# Patient Record
Sex: Female | Born: 1935 | Race: White | Hispanic: No | Smoking: Never smoker
Health system: Southern US, Community
[De-identification: ages and names within clinical notes are randomized; demographics above are authoritative.]

## PROBLEM LIST (undated history)

## (undated) ENCOUNTER — Emergency Department (HOSPITAL_COMMUNITY): Admission: EM | Discharge status: Against Medical Advice | Payer: Medicare Other

## (undated) DIAGNOSIS — M1990 Unspecified osteoarthritis, unspecified site: Secondary | ICD-10-CM

## (undated) DIAGNOSIS — I4891 Unspecified atrial fibrillation: Secondary | ICD-10-CM

## (undated) DIAGNOSIS — Z8042 Family history of malignant neoplasm of prostate: Secondary | ICD-10-CM

## (undated) DIAGNOSIS — A048 Other specified bacterial intestinal infections: Secondary | ICD-10-CM

## (undated) DIAGNOSIS — I499 Cardiac arrhythmia, unspecified: Secondary | ICD-10-CM

## (undated) DIAGNOSIS — F32A Depression, unspecified: Secondary | ICD-10-CM

## (undated) DIAGNOSIS — Z803 Family history of malignant neoplasm of breast: Secondary | ICD-10-CM

## (undated) DIAGNOSIS — K635 Polyp of colon: Secondary | ICD-10-CM

## (undated) DIAGNOSIS — F329 Major depressive disorder, single episode, unspecified: Secondary | ICD-10-CM

## (undated) DIAGNOSIS — K219 Gastro-esophageal reflux disease without esophagitis: Secondary | ICD-10-CM

## (undated) DIAGNOSIS — K222 Esophageal obstruction: Secondary | ICD-10-CM

## (undated) DIAGNOSIS — K625 Hemorrhage of anus and rectum: Secondary | ICD-10-CM

## (undated) DIAGNOSIS — F0390 Unspecified dementia without behavioral disturbance: Secondary | ICD-10-CM

## (undated) DIAGNOSIS — B0229 Other postherpetic nervous system involvement: Secondary | ICD-10-CM

## (undated) DIAGNOSIS — Z800 Family history of malignant neoplasm of digestive organs: Secondary | ICD-10-CM

## (undated) DIAGNOSIS — H3530 Unspecified macular degeneration: Secondary | ICD-10-CM

## (undated) DIAGNOSIS — K449 Diaphragmatic hernia without obstruction or gangrene: Secondary | ICD-10-CM

## (undated) DIAGNOSIS — E042 Nontoxic multinodular goiter: Secondary | ICD-10-CM

## (undated) DIAGNOSIS — E785 Hyperlipidemia, unspecified: Secondary | ICD-10-CM

## (undated) HISTORY — DX: Unspecified osteoarthritis, unspecified site: M19.90

## (undated) HISTORY — PX: APPENDECTOMY: SHX54

## (undated) HISTORY — DX: Other postherpetic nervous system involvement: B02.29

## (undated) HISTORY — DX: Major depressive disorder, single episode, unspecified: F32.9

## (undated) HISTORY — PX: TUBAL LIGATION: SHX77

## (undated) HISTORY — DX: Family history of malignant neoplasm of breast: Z80.3

## (undated) HISTORY — DX: Unspecified macular degeneration: H35.30

## (undated) HISTORY — DX: Family history of malignant neoplasm of prostate: Z80.42

## (undated) HISTORY — PX: ESOPHAGEAL DILATION: SHX303

## (undated) HISTORY — DX: Diaphragmatic hernia without obstruction or gangrene: K44.9

## (undated) HISTORY — DX: Gastro-esophageal reflux disease without esophagitis: K21.9

## (undated) HISTORY — DX: Hemorrhage of anus and rectum: K62.5

## (undated) HISTORY — DX: Depression, unspecified: F32.A

## (undated) HISTORY — DX: Hyperlipidemia, unspecified: E78.5

## (undated) HISTORY — DX: Other specified bacterial intestinal infections: A04.8

## (undated) HISTORY — PX: CARDIAC CATHETERIZATION: SHX172

## (undated) HISTORY — DX: Unspecified atrial fibrillation: I48.91

## (undated) HISTORY — DX: Esophageal obstruction: K22.2

## (undated) HISTORY — DX: Nontoxic multinodular goiter: E04.2

## (undated) HISTORY — DX: Family history of malignant neoplasm of digestive organs: Z80.0

## (undated) HISTORY — DX: Polyp of colon: K63.5

---

## 1970-10-21 HISTORY — PX: TOTAL ABDOMINAL HYSTERECTOMY: SHX209

## 1987-10-22 HISTORY — PX: CHOLECYSTECTOMY: SHX55

## 1998-06-21 ENCOUNTER — Other Ambulatory Visit: Admission: RE | Admit: 1998-06-21 | Discharge: 1998-06-21 | Payer: Self-pay | Admitting: Obstetrics and Gynecology

## 1999-06-26 ENCOUNTER — Ambulatory Visit (HOSPITAL_COMMUNITY): Admission: RE | Admit: 1999-06-26 | Discharge: 1999-06-26 | Payer: Self-pay | Admitting: Internal Medicine

## 1999-06-26 ENCOUNTER — Encounter: Payer: Self-pay | Admitting: Internal Medicine

## 1999-07-11 ENCOUNTER — Other Ambulatory Visit: Admission: RE | Admit: 1999-07-11 | Discharge: 1999-07-11 | Payer: Self-pay | Admitting: Obstetrics and Gynecology

## 1999-08-12 ENCOUNTER — Ambulatory Visit (HOSPITAL_COMMUNITY): Admission: RE | Admit: 1999-08-12 | Discharge: 1999-08-12 | Payer: Self-pay | Admitting: Rheumatology

## 1999-08-12 ENCOUNTER — Encounter: Payer: Self-pay | Admitting: Rheumatology

## 2000-01-09 ENCOUNTER — Encounter: Payer: Self-pay | Admitting: Internal Medicine

## 2000-01-09 ENCOUNTER — Ambulatory Visit (HOSPITAL_COMMUNITY): Admission: RE | Admit: 2000-01-09 | Discharge: 2000-01-09 | Payer: Self-pay | Admitting: Internal Medicine

## 2000-06-18 ENCOUNTER — Encounter: Admission: RE | Admit: 2000-06-18 | Discharge: 2000-06-18 | Payer: Self-pay | Admitting: Obstetrics and Gynecology

## 2000-06-18 ENCOUNTER — Encounter: Payer: Self-pay | Admitting: Obstetrics and Gynecology

## 2000-07-18 ENCOUNTER — Other Ambulatory Visit: Admission: RE | Admit: 2000-07-18 | Discharge: 2000-07-18 | Payer: Self-pay | Admitting: Obstetrics and Gynecology

## 2001-06-24 ENCOUNTER — Encounter: Admission: RE | Admit: 2001-06-24 | Discharge: 2001-06-24 | Payer: Self-pay | Admitting: Obstetrics and Gynecology

## 2001-06-24 ENCOUNTER — Encounter: Payer: Self-pay | Admitting: Obstetrics and Gynecology

## 2001-08-26 ENCOUNTER — Ambulatory Visit (HOSPITAL_COMMUNITY): Admission: RE | Admit: 2001-08-26 | Discharge: 2001-08-26 | Payer: Self-pay | Admitting: Internal Medicine

## 2001-08-26 ENCOUNTER — Encounter: Payer: Self-pay | Admitting: Internal Medicine

## 2002-10-07 ENCOUNTER — Other Ambulatory Visit: Admission: RE | Admit: 2002-10-07 | Discharge: 2002-10-07 | Payer: Self-pay | Admitting: Obstetrics and Gynecology

## 2002-11-29 ENCOUNTER — Encounter: Payer: Self-pay | Admitting: Internal Medicine

## 2002-11-29 ENCOUNTER — Encounter: Admission: RE | Admit: 2002-11-29 | Discharge: 2002-11-29 | Payer: Self-pay | Admitting: Internal Medicine

## 2003-07-07 ENCOUNTER — Encounter: Admission: RE | Admit: 2003-07-07 | Discharge: 2003-07-07 | Payer: Self-pay | Admitting: Internal Medicine

## 2003-07-07 ENCOUNTER — Encounter: Payer: Self-pay | Admitting: Internal Medicine

## 2003-10-22 DIAGNOSIS — K635 Polyp of colon: Secondary | ICD-10-CM

## 2003-10-22 HISTORY — PX: COLONOSCOPY W/ POLYPECTOMY: SHX1380

## 2003-10-22 HISTORY — DX: Polyp of colon: K63.5

## 2003-12-06 ENCOUNTER — Encounter: Payer: Self-pay | Admitting: Internal Medicine

## 2004-01-17 ENCOUNTER — Encounter: Admission: RE | Admit: 2004-01-17 | Discharge: 2004-01-17 | Payer: Self-pay | Admitting: Obstetrics and Gynecology

## 2004-02-06 ENCOUNTER — Other Ambulatory Visit: Admission: RE | Admit: 2004-02-06 | Discharge: 2004-02-06 | Payer: Self-pay | Admitting: Obstetrics and Gynecology

## 2004-02-23 ENCOUNTER — Encounter: Payer: Self-pay | Admitting: Internal Medicine

## 2005-01-18 ENCOUNTER — Encounter: Admission: RE | Admit: 2005-01-18 | Discharge: 2005-01-18 | Payer: Self-pay | Admitting: Internal Medicine

## 2005-01-18 ENCOUNTER — Ambulatory Visit: Payer: Self-pay | Admitting: Internal Medicine

## 2005-02-14 ENCOUNTER — Ambulatory Visit: Payer: Self-pay | Admitting: Internal Medicine

## 2005-02-16 ENCOUNTER — Emergency Department (HOSPITAL_COMMUNITY): Admission: EM | Admit: 2005-02-16 | Discharge: 2005-02-16 | Payer: Self-pay | Admitting: Emergency Medicine

## 2005-03-11 ENCOUNTER — Ambulatory Visit: Payer: Self-pay | Admitting: *Deleted

## 2005-03-20 ENCOUNTER — Ambulatory Visit: Payer: Self-pay

## 2005-05-13 ENCOUNTER — Ambulatory Visit: Payer: Self-pay | Admitting: Internal Medicine

## 2005-07-04 ENCOUNTER — Ambulatory Visit: Payer: Self-pay | Admitting: Internal Medicine

## 2005-08-19 ENCOUNTER — Ambulatory Visit: Payer: Self-pay | Admitting: Internal Medicine

## 2005-09-02 ENCOUNTER — Ambulatory Visit: Payer: Self-pay | Admitting: Internal Medicine

## 2005-09-02 ENCOUNTER — Ambulatory Visit (HOSPITAL_COMMUNITY): Admission: RE | Admit: 2005-09-02 | Discharge: 2005-09-02 | Payer: Self-pay | Admitting: Internal Medicine

## 2005-10-29 ENCOUNTER — Ambulatory Visit: Payer: Self-pay | Admitting: Internal Medicine

## 2006-07-29 ENCOUNTER — Ambulatory Visit: Payer: Self-pay | Admitting: Internal Medicine

## 2006-08-05 ENCOUNTER — Ambulatory Visit: Payer: Self-pay | Admitting: Internal Medicine

## 2006-08-18 ENCOUNTER — Ambulatory Visit: Payer: Self-pay | Admitting: Internal Medicine

## 2006-11-28 ENCOUNTER — Encounter: Admission: RE | Admit: 2006-11-28 | Discharge: 2006-11-28 | Payer: Self-pay | Admitting: Internal Medicine

## 2006-12-22 ENCOUNTER — Ambulatory Visit: Payer: Self-pay | Admitting: Internal Medicine

## 2008-01-28 ENCOUNTER — Ambulatory Visit: Payer: Self-pay | Admitting: Internal Medicine

## 2008-01-28 DIAGNOSIS — K219 Gastro-esophageal reflux disease without esophagitis: Secondary | ICD-10-CM | POA: Insufficient documentation

## 2008-01-28 DIAGNOSIS — E042 Nontoxic multinodular goiter: Secondary | ICD-10-CM | POA: Insufficient documentation

## 2008-01-28 DIAGNOSIS — E785 Hyperlipidemia, unspecified: Secondary | ICD-10-CM | POA: Insufficient documentation

## 2008-02-03 ENCOUNTER — Encounter (INDEPENDENT_AMBULATORY_CARE_PROVIDER_SITE_OTHER): Payer: Self-pay | Admitting: *Deleted

## 2008-02-04 ENCOUNTER — Telehealth: Payer: Self-pay | Admitting: Internal Medicine

## 2008-02-05 ENCOUNTER — Encounter: Admission: RE | Admit: 2008-02-05 | Discharge: 2008-02-05 | Payer: Self-pay | Admitting: Internal Medicine

## 2008-02-08 ENCOUNTER — Encounter (INDEPENDENT_AMBULATORY_CARE_PROVIDER_SITE_OTHER): Payer: Self-pay | Admitting: *Deleted

## 2008-02-10 ENCOUNTER — Encounter (INDEPENDENT_AMBULATORY_CARE_PROVIDER_SITE_OTHER): Payer: Self-pay | Admitting: *Deleted

## 2008-02-10 ENCOUNTER — Ambulatory Visit: Payer: Self-pay | Admitting: Internal Medicine

## 2008-02-10 LAB — CONVERTED CEMR LAB
OCCULT 1: NEGATIVE
OCCULT 2: NEGATIVE

## 2008-02-11 ENCOUNTER — Encounter: Payer: Self-pay | Admitting: Internal Medicine

## 2008-02-11 ENCOUNTER — Telehealth (INDEPENDENT_AMBULATORY_CARE_PROVIDER_SITE_OTHER): Payer: Self-pay | Admitting: *Deleted

## 2008-07-20 DIAGNOSIS — K222 Esophageal obstruction: Secondary | ICD-10-CM | POA: Insufficient documentation

## 2008-07-20 DIAGNOSIS — M1990 Unspecified osteoarthritis, unspecified site: Secondary | ICD-10-CM | POA: Insufficient documentation

## 2008-07-27 ENCOUNTER — Ambulatory Visit: Payer: Self-pay | Admitting: Internal Medicine

## 2008-07-29 ENCOUNTER — Ambulatory Visit: Payer: Self-pay | Admitting: Internal Medicine

## 2008-08-01 ENCOUNTER — Ambulatory Visit (HOSPITAL_COMMUNITY): Admission: RE | Admit: 2008-08-01 | Discharge: 2008-08-01 | Payer: Self-pay | Admitting: Internal Medicine

## 2008-08-02 ENCOUNTER — Telehealth: Payer: Self-pay | Admitting: Internal Medicine

## 2008-08-11 ENCOUNTER — Emergency Department (HOSPITAL_COMMUNITY): Admission: EM | Admit: 2008-08-11 | Discharge: 2008-08-11 | Payer: Self-pay | Admitting: Emergency Medicine

## 2008-08-11 ENCOUNTER — Telehealth: Payer: Self-pay | Admitting: Internal Medicine

## 2008-08-24 ENCOUNTER — Encounter: Payer: Self-pay | Admitting: Internal Medicine

## 2008-08-24 ENCOUNTER — Ambulatory Visit: Payer: Self-pay | Admitting: Internal Medicine

## 2008-08-31 ENCOUNTER — Telehealth: Payer: Self-pay | Admitting: Internal Medicine

## 2008-09-02 ENCOUNTER — Ambulatory Visit: Payer: Self-pay | Admitting: Internal Medicine

## 2008-09-02 ENCOUNTER — Encounter: Admission: RE | Admit: 2008-09-02 | Discharge: 2008-09-02 | Payer: Self-pay | Admitting: Internal Medicine

## 2008-09-05 ENCOUNTER — Telehealth (INDEPENDENT_AMBULATORY_CARE_PROVIDER_SITE_OTHER): Payer: Self-pay | Admitting: *Deleted

## 2008-09-05 ENCOUNTER — Encounter (INDEPENDENT_AMBULATORY_CARE_PROVIDER_SITE_OTHER): Payer: Self-pay | Admitting: *Deleted

## 2008-09-13 ENCOUNTER — Encounter: Admission: RE | Admit: 2008-09-13 | Discharge: 2008-09-13 | Payer: Self-pay | Admitting: Obstetrics and Gynecology

## 2008-09-13 ENCOUNTER — Encounter: Payer: Self-pay | Admitting: Internal Medicine

## 2008-10-21 HISTORY — PX: UPPER GI ENDOSCOPY: SHX6162

## 2008-11-23 ENCOUNTER — Ambulatory Visit: Payer: Self-pay | Admitting: Internal Medicine

## 2008-11-23 ENCOUNTER — Telehealth: Payer: Self-pay | Admitting: Internal Medicine

## 2008-11-28 ENCOUNTER — Ambulatory Visit: Payer: Self-pay | Admitting: Cardiology

## 2009-01-18 ENCOUNTER — Encounter (INDEPENDENT_AMBULATORY_CARE_PROVIDER_SITE_OTHER): Payer: Self-pay | Admitting: *Deleted

## 2009-02-13 ENCOUNTER — Encounter: Admission: RE | Admit: 2009-02-13 | Discharge: 2009-02-13 | Payer: Self-pay | Admitting: Obstetrics and Gynecology

## 2009-03-06 ENCOUNTER — Encounter: Payer: Self-pay | Admitting: Internal Medicine

## 2009-04-20 ENCOUNTER — Telehealth: Payer: Self-pay | Admitting: Internal Medicine

## 2009-04-26 ENCOUNTER — Ambulatory Visit: Payer: Self-pay | Admitting: Internal Medicine

## 2009-05-25 ENCOUNTER — Ambulatory Visit: Payer: Self-pay | Admitting: Internal Medicine

## 2009-05-26 ENCOUNTER — Encounter: Payer: Self-pay | Admitting: Internal Medicine

## 2009-05-29 LAB — CONVERTED CEMR LAB
ALT: 21 units/L (ref 0–35)
AST: 25 units/L (ref 0–37)
BUN: 16 mg/dL (ref 6–23)
Basophils Absolute: 0 10*3/uL (ref 0.0–0.1)
CO2: 29 meq/L (ref 19–32)
Calcium: 9.5 mg/dL (ref 8.4–10.5)
Cholesterol: 197 mg/dL (ref 0–200)
Creatinine, Ser: 0.7 mg/dL (ref 0.4–1.2)
Free T4: 0.8 ng/dL (ref 0.6–1.6)
Glucose, Bld: 78 mg/dL (ref 70–99)
Hemoglobin: 13.1 g/dL (ref 12.0–15.0)
MCV: 87 fL (ref 78.0–100.0)
Platelets: 275 10*3/uL (ref 150.0–400.0)
Sodium: 144 meq/L (ref 135–145)
TSH: 0.76 microintl units/mL (ref 0.35–5.50)
Total Bilirubin: 1.1 mg/dL (ref 0.3–1.2)
Total CHOL/HDL Ratio: 4
VLDL: 28.2 mg/dL (ref 0.0–40.0)

## 2009-05-31 ENCOUNTER — Encounter (INDEPENDENT_AMBULATORY_CARE_PROVIDER_SITE_OTHER): Payer: Self-pay | Admitting: *Deleted

## 2009-05-31 ENCOUNTER — Telehealth (INDEPENDENT_AMBULATORY_CARE_PROVIDER_SITE_OTHER): Payer: Self-pay | Admitting: *Deleted

## 2009-06-30 ENCOUNTER — Ambulatory Visit: Payer: Self-pay | Admitting: Internal Medicine

## 2009-06-30 LAB — HM COLONOSCOPY

## 2009-11-22 ENCOUNTER — Ambulatory Visit: Payer: Self-pay | Admitting: Internal Medicine

## 2009-12-04 LAB — CONVERTED CEMR LAB
ALT: 19 units/L (ref 0–35)
Cholesterol: 184 mg/dL (ref 0–200)
HDL: 52.4 mg/dL (ref >39.00)
Total Protein: 7 g/dL (ref 6.0–8.3)
Triglycerides: 101 mg/dL (ref 0.0–149.0)

## 2010-01-30 ENCOUNTER — Ambulatory Visit: Payer: Self-pay | Admitting: Internal Medicine

## 2010-01-30 DIAGNOSIS — F32A Depression, unspecified: Secondary | ICD-10-CM | POA: Insufficient documentation

## 2010-01-30 DIAGNOSIS — F329 Major depressive disorder, single episode, unspecified: Secondary | ICD-10-CM

## 2010-01-30 DIAGNOSIS — Z860100 Personal history of colon polyps, unspecified: Secondary | ICD-10-CM | POA: Insufficient documentation

## 2010-02-13 ENCOUNTER — Encounter: Payer: Self-pay | Admitting: Internal Medicine

## 2010-03-06 ENCOUNTER — Encounter: Payer: Self-pay | Admitting: Internal Medicine

## 2010-03-11 ENCOUNTER — Encounter: Payer: Self-pay | Admitting: Internal Medicine

## 2010-03-11 ENCOUNTER — Encounter: Admission: RE | Admit: 2010-03-11 | Discharge: 2010-03-11 | Payer: Self-pay | Admitting: Obstetrics and Gynecology

## 2010-03-16 ENCOUNTER — Encounter: Payer: Self-pay | Admitting: Internal Medicine

## 2010-08-14 ENCOUNTER — Ambulatory Visit: Payer: Self-pay | Admitting: Internal Medicine

## 2010-08-14 DIAGNOSIS — R1033 Periumbilical pain: Secondary | ICD-10-CM | POA: Insufficient documentation

## 2010-08-14 DIAGNOSIS — Z8711 Personal history of peptic ulcer disease: Secondary | ICD-10-CM | POA: Insufficient documentation

## 2010-08-29 ENCOUNTER — Ambulatory Visit: Payer: Self-pay | Admitting: Internal Medicine

## 2010-08-29 ENCOUNTER — Encounter: Payer: Self-pay | Admitting: Gastroenterology

## 2010-09-06 ENCOUNTER — Telehealth: Payer: Self-pay | Admitting: Gastroenterology

## 2010-10-23 ENCOUNTER — Encounter: Payer: Self-pay | Admitting: Internal Medicine

## 2010-11-11 ENCOUNTER — Encounter: Payer: Self-pay | Admitting: Internal Medicine

## 2010-11-18 LAB — CONVERTED CEMR LAB
ALT: 21 units/L (ref 0–35)
AST: 23 units/L (ref 0–37)
Albumin: 4.2 g/dL (ref 3.5–5.2)
Alkaline Phosphatase: 57 units/L (ref 39–117)
BUN: 17 mg/dL (ref 6–23)
Basophils Relative: 0.5 % (ref 0.0–1.0)
Bilirubin, Direct: 0.1 mg/dL (ref 0.0–0.3)
GFR calc non Af Amer: 75 mL/min
HCT: 40 % (ref 36.0–46.0)
Lymphocytes Relative: 37.2 % (ref 12.0–46.0)
MCHC: 32.5 g/dL (ref 30.0–36.0)
Monocytes Absolute: 0.4 10*3/uL (ref 0.1–1.0)
Monocytes Relative: 7.6 % (ref 3.0–12.0)
Neutro Abs: 3.2 10*3/uL (ref 1.4–7.7)
Neutrophils Relative %: 53.5 % (ref 43.0–77.0)
Platelets: 325 10*3/uL (ref 150–400)
RDW: 13.3 % (ref 11.5–14.6)
TSH: 0.82 microintl units/mL (ref 0.35–5.50)
Total Bilirubin: 1 mg/dL (ref 0.3–1.2)
VLDL: 32 mg/dL (ref 0–40)

## 2010-11-22 NOTE — Letter (Signed)
Summary: [facility]   Imported By: Lanelle Bal 03/24/2010 09:47:23  _____________________________________________________________________  External Attachment:    Type:   Image     Comment:   External Document

## 2010-11-22 NOTE — Progress Notes (Signed)
Summary: Breath Test Results  Phone Note Outgoing Call Call back at Work Phone [phone]   Call placed by: Harlow Mares CMA Duncan Dull),  September 06, 2010 8:26 AM Call placed to: Patient Summary of Call: advised pt that the breath test was neg, Initial call taken by: Harlow Mares CMA Duncan Dull),  September 06, 2010 8:26 AM

## 2010-11-22 NOTE — Procedures (Signed)
Summary: 530.81, 789.09/sheri  Pt here for urea breath test.  Jesse Fall RN  August 31, 2010 9:51 AM

## 2010-11-22 NOTE — Assessment & Plan Note (Signed)
Summary: problems w/swallowing--ch.   History of Present Illness Visit Type: Follow-up Visit Primary GI MD: [name] MD Primary Provider: Elmore Guise MD Requesting Provider: na Chief Complaint: Upper abd pain, and GERD  History of Present Illness:   This is a 77 year old white female with irritable bowel syndrome, gastroesophageal reflux and esophageal dysmotility based on a barium swallow in October 2009 showing impaired secondary peristalsis, prolonged pooling of the barium in the esophagus and tertiary contractions. There was positive reflux up to the level of the clavicle. A CT scan of the abdomen in February 2010 for evaluation of abdominal pain showed an 8 mm common bile duct which was consistent with postcholecystectomy state. She is complaining of continued abdominal tenderness around the umbilical area as well as at the epigastrium. A colonoscopy in May 2005 and again in September 2010 showed internal hemorrhoids but was otherwise normal. Her father had colon cancer. An upper endoscopy in October 2009 was positive for H. pylori which was treated.  She was dilated with a 49 [place] and 50 [place] dilator with complete relief of her dysphagia. She is under a lot of stress. She was unable to take dicyclomine due to dizziness and memory loss.   GI Review of Systems    Reports abdominal pain, acid reflux, and  heartburn.     Location of  Abdominal pain: upper abdomen.    Denies belching, bloating, chest pain, dysphagia with liquids, dysphagia with solids, loss of appetite, nausea, vomiting, vomiting blood, weight loss, and  weight gain.        Denies anal fissure, black tarry stools, change in bowel habit, constipation, diarrhea, diverticulosis, fecal incontinence, heme positive stool, hemorrhoids, irritable bowel syndrome, jaundice, light color stool, liver problems, rectal bleeding, and  rectal pain.    Current Medications (verified): 1)  Advil Migraine 200 Mg Caps (Ibuprofen) ....  2am, 2pm 2)  Glucosamine 1500 Complex  Caps (Glucosamine-Chondroit-Vit C-Mn) .Marland Kitchen.. 1 Once Daily 3)  Prevacid 15 Mg Cpdr (Lansoprazole) .Marland Kitchen.. 1 By Mouth Once Daily 4)  Bentyl 20 Mg Tabs (Dicyclomine Hcl) .... Take 1 Tablet By Mouth Two Times A Day(On Hold Per Pt) 5)  Caltrate 600 1500 Mg Tabs (Calcium Carbonate) .Marland Kitchen.. 1 By Mouth Once Daily 6)  Multivitamins  Tabs (Multiple Vitamin) .... One A Day-1 By Mouth Once Daily 7)  Vitamin D3 2000 Unit Caps (Cholecalciferol) .Marland Kitchen.. 1 By Mouth Once Daily 8)  Ocuvite  Tabs (Multiple Vitamins-Minerals) .... 2 By Mouth Once Daily 9)  Melatonin 3 Mg Tabs (Melatonin) .Marland Kitchen.. 1 By Mouth Once Daily 10)  Fish Oil 1000 Mg Caps (Omega-3 Fatty Acids) .... One Capsule By Mouth Once Daily  Allergies (verified): 1)  ! Codeine 2)  ! Sulfa 3)  ! Tylenol 4)  ! Tums (Calcium Carbonate Antacid) 5)  ! Penicillin  Past History:  Past Medical History: GOITER, UNSPECIFIED (ICD-240.9) OA/DJD GERD (ICD-530.81), Dr Juanda Chance Macular Degeneration unilaterally PREVENTIVE HEALTH CARE (ICD-V70.0) DEPRESSION (ICD-311) COLONIC POLYPS, HX OF (ICD-V12.72) OSTEOARTHRITIS (ICD-715.90) Hx of ESOPHAGEAL STRICTURE (ICD-530.3) NONTOXIC MULTINODULAR GOITER (ICD-241.1) HYPERLIPIDEMIA (ICD-272.4) FAMILY HISTORY OF COLON CA 1ST DEGREE RELATIVE <60 (ICD-V16.0) FAMILY HISTORY DIABETES 1ST DEGREE RELATIVE (ICD-V18.0) 553.3: Hernia, Hiatal  Past Surgical History: Reviewed history from 01/30/2010 and no changes required. Appendectomy  Tubal Ligation Cholecystectomy Hysterectomy(no BSO) for pelvic pain G4 P4 ; Esophageal dilation X3;Hypertensive  LES  & esophageal spasm Colon polypectomy 2005; neg 2010  Family History: Reviewed history from 01/30/2010 and no changes required. Father: colon cancer, OA,CAD Mother: CHF,CAD,DM Siblings:  sister : breast cancer,DM,CVA,lung cancer;sister MI,CVA; bro: CAD, throat cancer; M  aunt breast cancer  Social History: Retired On modified  low carb  diet Patient has never smoked.  Alcohol Use - no Regular exercise-yes: walks  30 min 3X/week & cardio @ gym  Review of Systems       Pertinent positive and negative review of systems were noted in the above HPI. All other ROS was otherwise negative.   Vital Signs:  Patient profile:   77 year old female Height:      63 inches Weight:      177 pounds BMI:     31.47 BSA:     1.84 Pulse rate:   76 / minute Pulse rhythm:   regular BP sitting:   128 / 80  (left arm) Cuff size:   regular  Vitals Entered By: Ok Anis CMA (August 14, 2010 1:57 PM)  Physical Exam  General:  Well developed, well nourished, no acute distress. Eyes:  PERRLA, no icterus. Mouth:  No deformity or lesions, dentition normal. Neck:  Supple; no masses or thyromegaly. Lungs:  Clear throughout to auscultation. Heart:  Regular rate and rhythm; no murmurs, rubs,  or bruits. Abdomen:  mild tenderness across upper abdomen on both sides of the midline. No fullness, no distention, lower abdomen unremarkable. Extremities:  No clubbing, cyanosis, edema or deformities noted. Skin:  Intact without significant lesions or rashes. Psych:  Alert and cooperative. Normal mood and affect.   Impression & Recommendations:  Problem # 1:  PERIUMBILICAL PAIN (ICD-789.05) Patient has continued periumbilical tenderness and discomfort. This is likely functional. She has had a complete GI workup. I discussed the possibility of functional abdominal pain and she agrees that it could be due to stress. Since she could not tolerate dicyclomine, we will start the diazepam 2mg  p.o. b.i.d.  Problem # 2:  GERD (ICD-530.81) Patient has esophageal dysmotility and is status post dilatation of a stricture with relief of dysphagia. She has a history of H. pylori. She is interested in [employer] to rule out the possibility of recurrent H. pylori infection.  Other Orders: Urea Breath Test (UBT)  Patient Instructions: 1)  [employer] to be  completed. If positive we will retreat her. 2)  Continue Prevacid 15 mg daily. 3)  Take Diazepam 2 mg p.o. b.i.d. 4)  Copy sent to : Dr Alwyn Ren 5)  The medication list was reviewed and reconciled.  All changed / newly prescribed medications were explained.  A complete medication list was provided to the patient / caregiver. Prescriptions: VALIUM 2 MG TABS (DIAZEPAM) Take 1 tablet by mouth two times a day  #60 x 1   Entered by:   Lamona Curl CMA (AAMA)   Authorized by:   Hart Carwin MD   Signed by:   Lamona Curl CMA (AAMA) on 08/14/2010   Method used:   Printed then faxed to ...       [facility] [phone]* (retail)       [address] [place], [place]  [address]       Ph: [phone]       Fax: [phone]   RxID:   [id number]

## 2010-11-22 NOTE — Letter (Signed)
Summary: [facility]   Imported By: Lanelle Bal 04/07/2010 11:22:27  _____________________________________________________________________  External Attachment:    Type:   Image     Comment:   External Document

## 2010-11-22 NOTE — Miscellaneous (Signed)
Summary: Diazepam refill  Clinical Lists Changes  Medications: Rx of VALIUM 2 MG TABS (DIAZEPAM) Take 1 tablet by mouth two times a day;  #60 x 1;  Signed;  Entered by: Christie Nottingham CMA (AAMA);  Authorized by: Hart Carwin MD;  Method used: Printed then [facility]ed to [facility]  [place] [address], [place], [place]  [address], Ph: [phone] or [phone], Fax: [phone]    Prescriptions: VALIUM 2 MG TABS (DIAZEPAM) Take 1 tablet by mouth two times a day  #60 x 1   Entered by:   Christie Nottingham CMA (AAMA)   Authorized by:   Hart Carwin MD   Signed by:   Christie Nottingham CMA (AAMA) on 10/23/2010   Method used:   Printed then faxed to ...       [facility]  [place] [address] (retail)       [address]       [place], [place]  [address]       Ph: [phone] or [phone]       Fax: [phone]   RxID:   [id number]

## 2010-11-22 NOTE — Assessment & Plan Note (Signed)
Summary: for med refill//ph   Vital Signs:  Patient profile:   77 year old female Height:      65 inches Weight:      179.6 pounds BMI:     30.00 Temp:     97.9 degrees F oral Pulse rate:   76 / minute Resp:     16 per minute BP sitting:   126 / 80  (left arm) Cuff size:   regular  Vitals Entered By: Shonna Chock (January 30, 2010 1:13 PM)  Comments REVIEWED MED LIST, PATIENT AGREED DOSE AND INSTRUCTION CORRECT    Primary Care Provider:  Elmore Guise MD   History of Present Illness: Bridget Lane is here for a physical; she is to see her Gyn for breast pain in [place].Preventive healthcare  measues reviewed ; all up to date except Living Will / POA. Depression is an issue; "I worry about everybody". Labs reviewed & risks discussed. She declined to take statin, but LDL has  improved w/o meds.  Allergies: 1)  ! Codeine 2)  ! Sulfa 3)  ! Tylenol 4)  ! Tums (Calcium Carbonate Antacid) 5)  ! Penicillin  Past History:  Past Medical History: GOITER, UNSPECIFIED (ICD-240.9) OA/DJD GERD (ICD-530.81), Dr Juanda Chance HYPERLIPIDEMIA (ICD-272.4): LDL goal = < 100( LDL 129 with 1784 total / 1610 small dense particles) Colonic polyps, hx of 2005 Macular Degeneration unilaterally  Past Surgical History: Appendectomy  Tubal Ligation Cholecystectomy Hysterectomy(no BSO) for pelvic pain G4 P4 ; Esophageal dilation X3;Hypertensive  LES  & esophageal spasm Colon polypectomy 2005; neg 2010  Family History: Father: colon cancer, OA,CAD Mother: CHF,CAD,DM Siblings: sister : breast cancer,DM,CVA,lung cancer;sister MI,CVA; bro: CAD, throat cancer; M  aunt breast cancer  Social History: On modified  low carb diet Patient has never smoked.  Alcohol Use - no Regular exercise-yes: walks  30 min 3X/week & cardio @ gym  Review of Systems  The patient denies anorexia, fever, vision loss, decreased hearing, hoarseness, chest pain, syncope, dyspnea on exertion, peripheral edema, prolonged cough,  headaches, hemoptysis, hematuria, incontinence, suspicious skin lesions, unusual weight change, abnormal bleeding, enlarged lymph nodes, angioedema, and breast masses.         Weight up 5 # due to  increased sweets. GI:  Complains of indigestion; denies bloody stools and dark tarry stools; No dysphagia. Psych:  Complains of anxiety, depression, easily angered, easily tearful, and irritability.  Physical Exam  General:  well-nourished;alert,appropriate and cooperative throughout examination Head:  Normocephalic and atraumatic without obvious abnormalities.  Eyes:  No corneal or conjunctival inflammation noted.Marland Kitchen Perrla. Funduscopic exam benign, without hemorrhages, exudates or papilledema. Small cataract suggested OD medially Ears:  External ear exam shows no significant lesions or deformities.  Otoscopic examination reveals clear canals, tympanic membranes are intact bilaterally without bulging, retraction, inflammation or discharge. Hearing is grossly normal bilaterally. Nose:  External nasal examination shows no deformity or inflammation. Nasal mucosa are pink and moist without lesions or exudates. Mouth:  Oral mucosa and oropharynx without lesions or exudates.  Teeth in good repair. Neck:  No deformities or tenderness noted.Goiter on R Lungs:  Normal respiratory effort, chest expands symmetrically. Lungs are clear to auscultation, no crackles or wheezes. Heart:  Normal rate and regular rhythm. S1 and S2 normal without gallop, murmur, click, rub or other extra sounds. Abdomen:  Bowel sounds positive,abdomen soft and non-tender without masses, organomegaly or hernias noted. Genitalia:  Dr Marcelle Overlie Msk:  No deformity or scoliosis noted of thoracic or lumbar spine.   Pulses:  R and L carotid,radial,dorsalis pedis and posterior tibial pulses are full and equal bilaterally Extremities:  No clubbing, cyanosis, edema. OA hand changes ; crepitus of knees Neurologic:  alert & oriented X3 and DTRs  symmetrical and normal.   Skin:  Intact without suspicious lesions or rashes . Small 5X5 mm vascular granuloma which blanches  L lower back Cervical Nodes:  No lymphadenopathy noted Axillary Nodes:  No palpable lymphadenopathy Psych:  Oriented X3, memory intact for recent and remote, flat affect, and subdued.     Impression & Recommendations:  Problem # 1:  PREVENTIVE HEALTH CARE (ICD-V70.0)  Orders: EKG w/ Interpretation (93000)  Problem # 2:  HYPERLIPIDEMIA (ICD-272.4)  The following medications were removed from the medication list:    Pravastatin Sodium 20 Mg Tabs (Pravastatin sodium) .Marland Kitchen... 1 at bedtime  Orders: EKG w/ Interpretation (93000)  Problem # 3:  COLONIC POLYPS, HX OF (ICD-V12.72)  Problem # 4:  OSTEOARTHRITIS (ICD-715.90)  Her updated medication list for this problem includes:    Advil Migraine 200 Mg Caps (Ibuprofen) .Marland Kitchen... 2am, 2pm  Problem # 5:  NONTOXIC MULTINODULAR GOITER (ICD-241.1)  Problem # 6:  DEPRESSION (ICD-311)  Her updated medication list for this problem includes:    Lorazepam 0.5 Mg Tabs (Lorazepam) .Marland Kitchen... 1 q 8-12 hrs as needed    Cymbalta 60 Mg Cpep (Duloxetine hcl) .Marland Kitchen... 1 once daily  Complete Medication List: 1)  Advil Migraine 200 Mg Caps (Ibuprofen) .... 2am, 2pm 2)  Glucosamine 1500 Complex Caps (Glucosamine-chondroit-vit c-mn) .Marland Kitchen.. 1 once daily 3)  Prevacid 15 Mg Cpdr (Lansoprazole) .Marland Kitchen.. 1 by mouth once daily 4)  Bentyl 20 Mg Tabs (Dicyclomine hcl) .... Take 1 tablet by mouth two times a day 5)  Lorazepam 0.5 Mg Tabs (Lorazepam) .Marland Kitchen.. 1 q 8-12 hrs as needed 6)  Caltrate 600 1500 Mg Tabs (Calcium carbonate) .Marland Kitchen.. 1 by mouth once daily 7)  Multivitamins Tabs (Multiple vitamin) .... One a day-1 by mouth once daily 8)  Vitamin D3 2000 Unit Caps (Cholecalciferol) .Marland Kitchen.. 1 by mouth once daily 9)  Ocuvite Tabs (Multiple vitamins-minerals) .... 2 by mouth once daily 10)  Phillips Colon Health Caps (Probiotic product) .Marland Kitchen.. 1 by mouth once  daily 11)  Melatonin 3 Mg Tabs (Melatonin) .Marland Kitchen.. 1 by mouth once daily 12)  Cymbalta 60 Mg Cpep (Duloxetine hcl) .Marland Kitchen.. 1 once daily  Patient Instructions: 1)  Share corrected report & labs with all MDs seen. Prescriptions: CYMBALTA 60 MG CPEP (DULOXETINE HCL) 1 once daily  #30 x 5   Entered and Authorized by:   Marga Melnick MD   Signed by:   Marga Melnick MD on 01/30/2010   Method used:   Print then Give to Patient   RxID:   [id number]

## 2010-12-06 ENCOUNTER — Telehealth: Payer: Self-pay | Admitting: Internal Medicine

## 2010-12-18 NOTE — Progress Notes (Signed)
Summary: pt has question re daughter  Phone Note Call from Patient Call back at Work Phone [phone]   Caller: Patient Reason for Call: Talk to Nurse Summary of Call: pt would like to talk to the dr re her daughter pt states her daughter does not have a heart dr.  Initial call taken by: Roe Coombs,  December 06, 2010 1:00 PM  Follow-up for Phone Call        spoke with pt who has seen Dr Gala Romney in the past as well as her husband Nadine Counts.  They are concerned about their daughter who is having "alot of heart problems" and they want to see if Dr Gala Romney will see her.  They had been told Dr Gala Romney isnt taking new pts but want to see if he will make an exception.  Will forward to Dr and Herbert Seta, RN for review Follow-up by: Charolotte Capuchin, RN,  December 06, 2010 2:55 PM  Additional Follow-up for Phone Call Additional follow up Details #1::        yes. i will see her. pls try to bring in soon. Dolores Patty, MD, [facility]  December 10, 2010 11:02 PM  can work in on 2/28 at 12, have spoken /w Mrs Lastra she will have her daughter call me Meredith Staggers, RN  December 12, 2010 4:07 PM

## 2011-03-08 NOTE — Letter (Signed)
December 22, 2006    Bridget Lane. Bridget Lane, M.D.  [address]  [place], [place] [address]   RE:  Bridget Lane  MRN:  [id number]  /  DOB:  1933-06-28   Dear Bridget Lane:   Thank you so much for sending me a letter concerning Bridget Lane  and her problem with bowel control and fecal incontinence.  I saw her  today.  We have seen Bridget Lane in the past for treatment of benign  esophageal stricture, gastroesophageal reflux disease and for a  screening colonoscopy, last one in May 2005, which showed internal  hemorrhoids.  She has been having trouble with irritable bowel syndrome  over the years, having mostly diarrhea.  The colon biopsies from 2005  did not show any evidence of colitis.  In the last year or two, she has  had several episodes of incontinent diarrhea, especially in the last 6  months, there have been about 5 episodes when she could not make it to  the bathroom.  This occurs mostly during the day after lunch or within  an hour after eating supper, usually when she is out.  If it happens at  home, she has no problem getting to the bathroom.  The stools are  usually soft, not liquidy.  There is no blood, and there is essentially  no warning before having to go.  Over the years, she has gained about 13  pounds.  When I saw her last time in January 2007, she weighed 166  pounds.  She is currently 179 pounds.  She admits to poor eating habits  and less exercise over the years.  She also gives me history of having  three vaginal deliveries, some of them apparently quite traumatic, which  left her with weak rectal sphincter.   MEDICATIONS:  Multiple vitamins.   PHYSICAL EXAMINATION:  VITAL SIGNS:  Blood pressure 130/70, pulse 80 and  weight 179 pounds.  GENERAL:  She was very nice, alert, oriented.  LUNGS:  Clear to auscultation.  COR:  quiet precordium , normal S1, normal S2.  ABDOMEN:  Soft with normoactive bowel sounds, somewhat protuberant,  decreased muscle  tone, minimal discomfort in right lower quadrant and  left lower quadrant.  No rebound.  Liver edge at costal margin.  Post  cholecystectomy scar in right upper quadrant since 1980s.  RECTAL:  Exam reveals normal perianal area with somewhat lax rectal  tone.  She did weak to moderate squeeze of the anal sphincter.  Stool  was soft, heme-occult negative.  There was no fecal impaction.   IMPRESSION:  A 77 year old white female with urgent stools and some  incontinence, due to exacerbation of irritable bowel syndrome, and also  due to decreased rectal sphincter tone.  She has quite unhealthy eating  habits, and it would be of most importance that she modifies her eating  habits to high fiber, low-fat diet.  I have asked her to lose about 20  pounds or at least 15 pounds and start some exercise to improve her  pelvic muscle tone.  We have also started her on Levsin 0.125 mg  sublingually before lunch and before supper, for gastrocolic reflex that  will diminish the urge to go to the bathroom.  She would take it when  she is out shopping or doing other activities.  At the same time, she  will start on Bene-Fiber, and we gave her samples to use at the  beginning on a daily basis.  There is no need for repeat colonoscopy.  She had one in May 2005.  I will be happy to see her in the next few  months to follow up on the progress.    Sincerely,      Bridget Lane. Bridget Chance, MD  Electronically Signed    DMB/MedQ  DD: 12/22/2006  DT: 12/22/2006  Job #: [id number]

## 2011-03-13 ENCOUNTER — Ambulatory Visit (INDEPENDENT_AMBULATORY_CARE_PROVIDER_SITE_OTHER): Payer: Medicare Other | Admitting: Internal Medicine

## 2011-03-13 ENCOUNTER — Encounter: Payer: Self-pay | Admitting: Internal Medicine

## 2011-03-13 DIAGNOSIS — Z860100 Personal history of colon polyps, unspecified: Secondary | ICD-10-CM

## 2011-03-13 DIAGNOSIS — Z8711 Personal history of peptic ulcer disease: Secondary | ICD-10-CM

## 2011-03-13 DIAGNOSIS — K219 Gastro-esophageal reflux disease without esophagitis: Secondary | ICD-10-CM

## 2011-03-13 DIAGNOSIS — K222 Esophageal obstruction: Secondary | ICD-10-CM

## 2011-03-13 DIAGNOSIS — M1990 Unspecified osteoarthritis, unspecified site: Secondary | ICD-10-CM

## 2011-03-13 DIAGNOSIS — R1084 Generalized abdominal pain: Secondary | ICD-10-CM

## 2011-03-13 LAB — CBC WITH DIFFERENTIAL/PLATELET
Basophils Relative: 0.7 % (ref 0.0–3.0)
Eosinophils Absolute: 0.1 10*3/uL (ref 0.0–0.7)
Eosinophils Relative: 2.2 % (ref 0.0–5.0)
HCT: 36.5 % (ref 36.0–46.0)
Hemoglobin: 12.3 g/dL (ref 12.0–15.0)
Lymphs Abs: 2.4 10*3/uL (ref 0.7–4.0)
MCHC: 33.8 g/dL (ref 30.0–36.0)
MCV: 88.4 fl (ref 78.0–100.0)
Monocytes Absolute: 0.5 10*3/uL (ref 0.1–1.0)
Neutro Abs: 3.7 10*3/uL (ref 1.4–7.7)
RBC: 4.13 Mil/uL (ref 3.87–5.11)
WBC: 6.8 10*3/uL (ref 4.5–10.5)

## 2011-03-13 LAB — AMYLASE: Amylase: 68 U/L (ref 27–131)

## 2011-03-13 MED ORDER — FENTANYL 25 MCG/HR TD PT72: 1.0000 | MEDICATED_PATCH | TRANSDERMAL | Status: DC

## 2011-03-13 NOTE — Patient Instructions (Signed)
Please complete stool cards.The triggers for dyspepsia or "heart burn"  include stress; the "aspirin family" ; alcohol; peppermint; and caffeine (coffee, tea, cola, and chocolate). The aspirin family would include aspirin and the nonsteroidal agents such as ibuprofen &  Naproxen. Tylenol would not cause reflux. If having dyspepsia ; food & dink should be avoided for @ least 2 hors before going to bed.  

## 2011-03-13 NOTE — Progress Notes (Signed)
  Subjective:    Patient ID: Bridget Lane, female    DOB: May 01, 1933, 77 y.o.   MRN: [id number]  HPI ABDOMINAL PAIN  Location: epigastrium &  suprapubic      Onset: > 2 years  Radiation: no     Severity: stays @ level 5 Quality: constant dull in suprapubic; raw constant epigastric pain Duration: always raw superiorly  Better with: Probiotic helped lower pain Worse with: every meal causes upper pain ; salads, dairy &  fast food cause loose to watery stool & lower pain Symptoms Nausea/Vomiting: no  Diarrhea: yes, occasionally  Constipation: no  Melena/BRBPR: yes, only on tissue  Hematemesis: no  Anorexia: slightly Fever/Chills: no  Dysuria/hematuria/pyuria: no  Wt loss: no  EtOH use: no  NSAIDs/ASA: yes, NSAIDS 2-4/ day for diffuse OA  Past Surgeries: upper Endo : + H pylori gastritis ? 2008 or 2009;she reacted to Amoxicillin as part of triple therapy. PMH of esophageal stricture, Colon polyps .  She is concerned that she still has H. pylori gastritis and as there was difficulty completing triple therapy for this organism. Additionally even though the breathing test after therapy #1 epigastric and suprapubic abdominal pain was negative for the organisms she is worried that the machine itself could pass the bacteria .          Review of Systems     Objective:   Physical Exam General appearance is of good health and nourishment; no acute distress or increased work of breathing is present.  No  lymphadenopathy about the head, neck, or axilla noted.   Eyes: No conjunctival inflammation or lid edema is present. There is no scleral icterus.   Oral exam: Dental hygiene is good; lips and gums are healthy appearing.There is no oropharyngeal erythema or exudate noted.   Neck:  No deformities, masses, or tenderness noted.   Supple with full range of motion without pain. There is enlargement of the right thyroid without nodularity.  Heart:  Normal rate and regular rhythm. S1 and S2  normal without gallop,  click, rub or other extra sounds. She has a grade 1/2 systolic murmur at the heart base.   Lungs:Chest clear to auscultation; no wheezes, rhonchi,rales ,or rubs present.No increased work of breathing.    Extremities:  No cyanosis, edema, or clubbing  noted    Skin: Warm & dry w/o jaundice or tenting.  Abdomen: bowel sounds normal, soft  But tender in epigastrium & suprapubic areas without masses, organomegaly or hernias noted.  No guarding or rebound .  She has marked by classic osteoarthritic changes of the hands. All pulses intact without  bruits .No ischemic skin changes.          Assessment & Plan:  #1 epigastric and suprapubic pain. This is in the context of prior H pylori gastritis and esophageal stricture.  #2 diffuse osteoarthritis with pain; chronic nonsteroidal ingestion which will  Exacerbate # 1  Plan #1 increase Prilosec to one pill 30 minutes before breakfast as well as evening meal. I believe an upper Endo should be repeated to rule out stricture or recurrent gastritis.  #2 attempt to wean and discontinue nonsteroidals. Fentanyl patch will be ordered for pain.

## 2011-03-14 ENCOUNTER — Ambulatory Visit (INDEPENDENT_AMBULATORY_CARE_PROVIDER_SITE_OTHER): Payer: Medicare Other | Admitting: Internal Medicine

## 2011-03-14 ENCOUNTER — Encounter: Payer: Self-pay | Admitting: Internal Medicine

## 2011-03-14 VITALS — BP 132/64 | HR 76 | Ht 64.0 in | Wt 177.0 lb

## 2011-03-14 DIAGNOSIS — K219 Gastro-esophageal reflux disease without esophagitis: Secondary | ICD-10-CM

## 2011-03-14 DIAGNOSIS — R932 Abnormal findings on diagnostic imaging of liver and biliary tract: Secondary | ICD-10-CM

## 2011-03-14 MED ORDER — OMEPRAZOLE 40 MG PO CPDR: DELAYED_RELEASE_CAPSULE | ORAL | Status: DC

## 2011-03-14 NOTE — Progress Notes (Signed)
Bridget Lane 1932-11-05 MRN [id number]     History of Present Illness:  This is a 77 year old white female with irritable bowel syndrome and gastroesophageal reflux disease. Her last appointment was in October 2011. She has a history of H. pylori gastropathy which was treated in October 2009. She turned out to be allergic to amoxicillin but took the full course. A PyloriTek test was negative. She has a history of esophageal stricture dilated in 2006 with Savary dilators. A barium swallow in 2009 showed esophageal dysmotility with impaired secondary peristalsis, prolonged pooling of the barium in the esophagus and tertiary contractions. She had reflux to the level of the clavicle. She continues to have raspiness, dry throat and dysphagia. She is taking Prilosec 20 mg daily. She claims that her voice has changed in that she easily loses her voice. Her brother had throat cancer. Her last colonoscopy was in September 2010 because of family history of colon cancer in her father. It showed internal hemorrhoids.    Past Medical History  Diagnosis Date  . Goiter   . OA (osteoarthritis)   . DJD (degenerative joint disease)   . Macular degeneration   . Depression   . Colon polyp   . Esophageal stricture   . Hyperlipidemia   . H. pylori infection    Past Surgical History  Procedure Date  . Appendectomy   . Tubal ligation   . Cholecystectomy   . Total abdominal hysterectomy   . Esophageal dilation     x3  . Colonoscopy w/ polypectomy 4098,1191    Neg in 2010    reports that she has never smoked. She does not have any smokeless tobacco history on file. She reports that she does not drink alcohol. Her drug history not on file. family history includes Breast cancer in her maternal aunt and sister; Colon cancer in her father; Coronary artery disease in her brother, father, and mother; Diabetes in her mother and sisters; Heart attack in her sister; Heart failure in her mother; Osteoarthritis in  her father; Stroke in her sister; and Throat cancer in her brother. Allergies  Allergen Reactions  . Acetaminophen     REACTION: hurts stomach  . Calcium Carbonate     REACTION: ? reaction  . Codeine     REACTION: ? hallucinations  . Penicillins     REACTION: rash  . Sulfonamide Derivatives     REACTION: rash        Review of Systems: Denies shortness of breath, chest pain, abdominal pain or change in bowel habits.  The remainder of the 10  point ROS is negative except as outlined in H&P   Physical Exam: General appearance  Well developed, in no distress. Eyes- non icteric. HEENT nontraumatic, normocephalic. Mouth no lesions, tongue papillated, no cheilosis. Neck supple without adenopathy, thyroid not enlarged, no carotid bruits, no JVD. Lungs Clear to auscultation bilaterally. Cor normal S1 normal S2, regular rhythm , no murmur,  quiet precordium. Abdomen soft nontender with normoactive bowel sounds. No distention. Liver edge at costal margin. Rectal: Not done. Extremities no pedal edema. Skin no lesions. Neurological alert and oriented x 3. Psychological normal mood and affect.  Assessment and Plan:  Problem #1 gastroesophageal reflux disease poorly controlled on Prilosec 20 mg daily. We will increase the dose from 20 to 40 mg twice a day. She is to continue antireflux measures. We need to consider repeating upper endoscopy with dilatation. She would rather not have EGD at this time. She has also  been told not to take Ibuprofen.  Problem #2 family history of colon cancer. She is up-to-date on her colonoscopy. Her last exam was in September 2010. A recall will be due in September 2015.  Problem #3 esophageal dysmotility. This has been documented on a barium swallow from 2009. We discussed the chin tuck and antireflux measures to minimize reflux and coughing. She may eventually need to see a speech pathologist.   03/14/2011 [name]

## 2011-03-14 NOTE — Patient Instructions (Addendum)
We have sent you a prescription for omeprazole 40 mg one tablet twice daily. CC Dr Alwyn Ren

## 2011-03-19 ENCOUNTER — Ambulatory Visit (INDEPENDENT_AMBULATORY_CARE_PROVIDER_SITE_OTHER): Payer: Medicare Other | Admitting: Internal Medicine

## 2011-03-19 ENCOUNTER — Encounter: Payer: Self-pay | Admitting: Internal Medicine

## 2011-03-19 DIAGNOSIS — M792 Neuralgia and neuritis, unspecified: Secondary | ICD-10-CM | POA: Insufficient documentation

## 2011-03-19 DIAGNOSIS — B029 Zoster without complications: Secondary | ICD-10-CM | POA: Insufficient documentation

## 2011-03-19 DIAGNOSIS — G548 Other nerve root and plexus disorders: Secondary | ICD-10-CM

## 2011-03-19 DIAGNOSIS — M1990 Unspecified osteoarthritis, unspecified site: Secondary | ICD-10-CM

## 2011-03-19 MED ORDER — FENTANYL 25 MCG/HR TD PT72: 1.0000 | MEDICATED_PATCH | TRANSDERMAL | Status: AC

## 2011-03-19 MED ORDER — GABAPENTIN 100 MG PO CAPS: 100.0000 mg | ORAL_CAPSULE | Freq: Three times a day (TID) | ORAL | Status: DC

## 2011-03-19 NOTE — Progress Notes (Signed)
  Subjective:    Patient ID: Bridget Lane, female    DOB: 02-04-1933, 77 y.o.   MRN: [id number]  HPI RASH  Location: R inframammary area Onset: 5/26  Course: ?drying on Acyclovir Self-treated with: Tylenol  M,.            Improvement with treatment: yes  History Pruritis: no  Tenderness: yes, stinging  Red Flags Feeling ill: no  Fever: yes  Mouth lesions: no  Facial/tongue swelling/difficulty breathing:  no  Diabetic or immunocompromised: no       Review of Systems     Objective:   Physical Exam General appearance is of good health and nourishment; no acute distress or increased work of breathing is present.  No  lymphadenopathy about the head, neck, or axilla noted.   Eyes: No conjunctival inflammation or lid edema is present. There is no scleral icterus.   Heart:  Normal rate and regular rhythm. S1 and S2 normal without gallop, murmur, click, rub or other extra sounds. S4 Lungs:Chest clear to auscultation; no wheezes, rhonchi,rales ,or rubs present.No increased work of breathing.    Extremities:  No cyanosis, edema, or clubbing  noted    Skin: Classic  R  T 4 Zoster.           Assessment & Plan:  #1 classic right thoracic T4 zoster  Plan: Titrate gabapentin 100 mg up to 300 mg every 8 hours as needed for herpetic neuralgia pain

## 2011-03-19 NOTE — Patient Instructions (Signed)
Titrate gabapentin as discussed for zoster neuralgia

## 2011-03-27 ENCOUNTER — Other Ambulatory Visit (INDEPENDENT_AMBULATORY_CARE_PROVIDER_SITE_OTHER): Payer: Medicare Other

## 2011-03-27 DIAGNOSIS — Z1211 Encounter for screening for malignant neoplasm of colon: Secondary | ICD-10-CM

## 2011-03-27 LAB — HEMOCCULT GUIAC POC 1CARD (OFFICE): Card #3 Fecal Occult Blood, POC: NEGATIVE

## 2011-03-27 NOTE — Progress Notes (Signed)
Labs only

## 2011-03-28 ENCOUNTER — Other Ambulatory Visit: Payer: Self-pay | Admitting: Internal Medicine

## 2011-03-28 NOTE — Telephone Encounter (Signed)
Per Dr.Hopper patient had the course of treatment for shingles and additional rx not recommend

## 2011-03-29 ENCOUNTER — Telehealth: Payer: Self-pay | Admitting: Internal Medicine

## 2011-03-29 MED ORDER — GABAPENTIN 100 MG PO CAPS: 100.0000 mg | ORAL_CAPSULE | Freq: Three times a day (TID) | ORAL | Status: DC

## 2011-03-29 NOTE — Telephone Encounter (Signed)
Spoke w/ pt informed that she should have been still taking valtrex as prescribed.

## 2011-03-29 NOTE — Telephone Encounter (Signed)
Patient said she was seen at an urgent care diagnosed with shingles - she followed up with dr hopper & was given neurontin - she said she is still in a lot of pain & wanted to know if she is to be taking the med urgent care prescribed her (she doesn't know what it was) she said she is in too much pain to come in

## 2011-04-04 ENCOUNTER — Other Ambulatory Visit: Payer: Self-pay | Admitting: Internal Medicine

## 2011-04-04 NOTE — Telephone Encounter (Signed)
Dr.Hopper has already indicated that this med will not be refilled, patient must have OV first. Note sent to pharmacy

## 2011-04-26 ENCOUNTER — Telehealth: Payer: Self-pay | Admitting: Internal Medicine

## 2011-04-26 NOTE — Telephone Encounter (Signed)
Per Hop renew Fentanyl then after speaking w/ pt says she can't tolerate medication and just stopped thinks she is having a reaction to medication has been unable to sleep for 4 days and just overall not feeling well and wanted to know what she should do was unable to come in on Tues. Pt to be scheduled for Sat. Clinic.

## 2011-04-26 NOTE — Telephone Encounter (Signed)
Hop pls advise 

## 2011-04-27 ENCOUNTER — Encounter: Payer: Self-pay | Admitting: Family Medicine

## 2011-04-27 ENCOUNTER — Ambulatory Visit (INDEPENDENT_AMBULATORY_CARE_PROVIDER_SITE_OTHER): Payer: Medicare Other | Admitting: Family Medicine

## 2011-04-27 DIAGNOSIS — F19239 Other psychoactive substance dependence with withdrawal, unspecified: Secondary | ICD-10-CM | POA: Insufficient documentation

## 2011-04-27 DIAGNOSIS — B0229 Other postherpetic nervous system involvement: Secondary | ICD-10-CM | POA: Insufficient documentation

## 2011-04-27 DIAGNOSIS — F19939 Other psychoactive substance use, unspecified with withdrawal, unspecified: Secondary | ICD-10-CM | POA: Insufficient documentation

## 2011-04-27 DIAGNOSIS — B029 Zoster without complications: Secondary | ICD-10-CM

## 2011-04-27 DIAGNOSIS — M792 Neuralgia and neuritis, unspecified: Secondary | ICD-10-CM

## 2011-04-27 HISTORY — DX: Other postherpetic nervous system involvement: B02.29

## 2011-04-27 NOTE — Progress Notes (Signed)
Subjective:    Patient ID: Candis Schatz, female    DOB: January 26, 1933, 77 y.o.   MRN: [id number]  HPI  77 yo pt of Dr. Frederik Pear here for ?reaction to Fentanyl.   Notes reviewed.  Diagnosed with T4 distributed Herpes Zoster on 5/26. S/p Acyclovir, Gabapentin.  Per pt, Fentanyl was originally given for arthritis, not Shingles. She says her neuralgia was never "that bad."   Felt sleepy SOB, sweaty with Fentanyl.  Kept the patch on for about a month. Was time to refill it and did not want to refill it because of how it made her feel. Abruptly stopped it 5 days ago.  Unable to sleep since stopping it.  Nauseated, no vomiting. No energy.    Went to urgent care yesterday, was told she was withdrawing and given Xanax which she does not want to use.  Review of Systems See HPI  Patient Active Problem List  Diagnoses  . NONTOXIC MULTINODULAR GOITER  . HYPERLIPIDEMIA  . DEPRESSION  . ESOPHAGEAL STRICTURE  . GERD  . OSTEOARTHRITIS  . PERIUMBILICAL PAIN  . HELICOBACTER PYLORI INFECTION, HX OF  . COLONIC POLYPS, HX OF  . Herpes zoster  . Neuralgia of chest   Past Medical History  Diagnosis Date  . Goiter   . OA (osteoarthritis)   . DJD (degenerative joint disease)   . Macular degeneration   . Depression   . Colon polyp   . Esophageal stricture   . Hyperlipidemia   . H. pylori infection    Past Surgical History  Procedure Date  . Appendectomy   . Tubal ligation   . Cholecystectomy   . Total abdominal hysterectomy   . Esophageal dilation     x3  . Colonoscopy w/ polypectomy 4098,1191    Neg in 2010   History  Substance Use Topics  . Smoking status: Never Smoker   . Smokeless tobacco: Not on file  . Alcohol Use: No   Family History  Problem Relation Age of Onset  . Colon cancer Father   . Osteoarthritis Father   . Coronary artery disease Father   . Heart failure Mother   . Coronary artery disease Mother   . Diabetes Mother   . Breast cancer Sister   .  Diabetes Sister   . Diabetes Sister   . Stroke Sister   . Heart attack Sister   . Coronary artery disease Brother   . Throat cancer Brother   . Breast cancer Maternal Aunt    Allergies  Allergen Reactions  . Acetaminophen     REACTION: hurts stomach  . Calcium Carbonate     REACTION: ? reaction  . Codeine     REACTION: ? hallucinations  . Penicillins     REACTION: rash  . Sulfonamide Derivatives     REACTION: rash   Current Outpatient Prescriptions on File Prior to Visit  Medication Sig Dispense Refill  . Calcium Carbonate (CALTRATE 600 PO) Take by mouth daily.        . Cholecalciferol (VITAMIN D3) 2000 UNITS TABS Take by mouth daily.        Marland Kitchen gabapentin (NEURONTIN) 100 MG capsule Take 1 capsule (100 mg total) by mouth 3 (three) times daily. 1 pill every 8 hrs prn ; increase by 1 pill 3 X / day every 48-72 hrs up to 3 pills tid prn  60 capsule  1  . Glucosamine-Chondroit-Vit C-Mn (GLUCOSAMINE 1500 COMPLEX PO) Take by mouth daily.        Marland Kitchen  Melatonin 3 MG TABS Take by mouth.        . Multiple Vitamin (MULTIVITAMIN) tablet Take 1 tablet by mouth daily.        . Multiple Vitamins-Minerals (OCUVITE PO) Take by mouth. 2 by mouth daily        . Omega-3 Fatty Acids (FISH OIL) 1000 MG CAPS Take by mouth daily.        Marland Kitchen omeprazole (PRILOSEC) 40 MG capsule Take 1 tablet by mouth twice daily  60 capsule  3  . Probiotic Product (PROBIOTIC PO) Take by mouth daily.        . valACYclovir (VALTREX) 1000 MG tablet Take 1,000 mg by mouth 3 (three) times daily. X 7days for Shingles        The PMH, PSH, Social History, Family History, Medications, and allergies have been reviewed in [facility], and have been updated if relevant.     Objective:    BP 100/64  Pulse 88  Temp(Src) 97.4 F (36.3 C) (Oral)  Ht 5\' 4"  (1.626 m)  Wt 166 lb (75.297 kg)  BMI 28.49 kg/m2   General appearance:   is of good health and nourishment; no acute distress or increased work of breathing is present.  No   lymphadenopathy about the head, neck, or axilla noted.  Eyes: No conjunctival inflammation or lid edema is present. There is no scleral icterus. Heart:  Normal rate and regular rhythm. S1 and S2 normal without gallop, murmur, click, rub or other extra sounds. S4 Lungs:Chest clear to auscultation; no wheezes, rhonchi,rales ,or rubs present.No increased work of breathing.   Extremities:  No cyanosis, edema, or clubbing  noted  Skin: lesions resolved         Assessment & Plan:   1. Herpes zoster  Symptoms resolved.  Rash resolved. No further pain medication necessary.   2. Withdrawal symptoms, drug or narcotic  Add Fentanyl to allergy list. Symptoms are gradually improving. Advised to just give it a little more time. VSS and exam unremarkable.

## 2011-04-30 ENCOUNTER — Encounter: Payer: Self-pay | Admitting: Internal Medicine

## 2011-04-30 ENCOUNTER — Inpatient Hospital Stay (HOSPITAL_COMMUNITY)
Admission: EM | Admit: 2011-04-30 | Discharge: 2011-05-02 | DRG: 310 | Disposition: A | Discharge status: Home / Self Care | Payer: Medicare Other | Attending: Internal Medicine | Admitting: Internal Medicine

## 2011-04-30 ENCOUNTER — Emergency Department (HOSPITAL_COMMUNITY): Payer: Medicare Other

## 2011-04-30 ENCOUNTER — Ambulatory Visit (INDEPENDENT_AMBULATORY_CARE_PROVIDER_SITE_OTHER): Payer: Medicare Other | Admitting: Internal Medicine

## 2011-04-30 ENCOUNTER — Ambulatory Visit: Payer: Medicare Other | Admitting: Internal Medicine

## 2011-04-30 VITALS — BP 126/78 | HR 92 | Temp 98.1°F | Wt 168.0 lb

## 2011-04-30 DIAGNOSIS — R079 Chest pain, unspecified: Secondary | ICD-10-CM

## 2011-04-30 DIAGNOSIS — K219 Gastro-esophageal reflux disease without esophagitis: Secondary | ICD-10-CM | POA: Diagnosis present

## 2011-04-30 DIAGNOSIS — R630 Anorexia: Secondary | ICD-10-CM | POA: Diagnosis present

## 2011-04-30 DIAGNOSIS — R0602 Shortness of breath: Secondary | ICD-10-CM

## 2011-04-30 DIAGNOSIS — G479 Sleep disorder, unspecified: Secondary | ICD-10-CM

## 2011-04-30 DIAGNOSIS — F411 Generalized anxiety disorder: Secondary | ICD-10-CM | POA: Diagnosis present

## 2011-04-30 DIAGNOSIS — I4891 Unspecified atrial fibrillation: Principal | ICD-10-CM | POA: Diagnosis present

## 2011-04-30 DIAGNOSIS — E7800 Pure hypercholesterolemia, unspecified: Secondary | ICD-10-CM | POA: Diagnosis present

## 2011-04-30 DIAGNOSIS — R0789 Other chest pain: Secondary | ICD-10-CM | POA: Diagnosis present

## 2011-04-30 DIAGNOSIS — G4700 Insomnia, unspecified: Secondary | ICD-10-CM | POA: Diagnosis present

## 2011-04-30 LAB — DIFFERENTIAL
Lymphs Abs: 2.5 10*3/uL (ref 0.7–4.0)
Monocytes Relative: 8 % (ref 3–12)
Neutro Abs: 5.6 10*3/uL (ref 1.7–7.7)
Neutrophils Relative %: 63 % (ref 43–77)

## 2011-04-30 LAB — POCT I-STAT, CHEM 8
BUN: 17 mg/dL (ref 6–23)
Chloride: 110 mEq/L (ref 96–112)
HCT: 38 % (ref 36.0–46.0)
Potassium: 3.5 mEq/L (ref 3.5–5.1)
Sodium: 142 mEq/L (ref 135–145)

## 2011-04-30 LAB — D-DIMER, QUANTITATIVE: D-Dimer, Quant: 0.63 ug/mL-FEU — ABNORMAL HIGH (ref 0.00–0.48)

## 2011-04-30 LAB — CBC
Hemoglobin: 13.1 g/dL (ref 12.0–15.0)
MCH: 29.4 pg (ref 26.0–34.0)
MCV: 84.5 fL (ref 78.0–100.0)
RBC: 4.45 MIL/uL (ref 3.87–5.11)
WBC: 8.9 10*3/uL (ref 4.0–10.5)

## 2011-04-30 LAB — CK TOTAL AND CKMB (NOT AT ARMC)
CK, MB: 2 ng/mL (ref 0.3–4.0)
Relative Index: INVALID (ref 0.0–2.5)

## 2011-04-30 LAB — TROPONIN I: Troponin I: <0.3 ng/mL (ref <0.30)

## 2011-04-30 MED ORDER — IOHEXOL 300 MG/ML  SOLN
100.0000 mL | Freq: Once | INTRAMUSCULAR | Status: AC | PRN
  Administered 2011-04-30: 100 mL via INTRAVENOUS

## 2011-04-30 NOTE — Patient Instructions (Signed)
Please go to the restroom with the EKG and the 3 office notes. Let the triage nurse copy of these reports for the emergency room physician.

## 2011-04-30 NOTE — Progress Notes (Signed)
  Subjective:    Patient ID: Bridget Lane, female    DOB: June 13, 1933, 77 y.o.   MRN: [id number]  HPI  Insomnia Onset:after she had chest pain wearing  Fentanyl patch;  Pattern: Difficulty going to sleep:yes Frequent awakening:yes Early awakening:not sleeping for > 1 hr Nightmares:no Abnormal leg movement:RLS, no treatment Snoring:yes Apnea:no as per husband Risk factors/sleep hygiene: Stimulants:no Alcohol intake:no Reading, watching TV, eating @ bedtime:no Daytime naps:no Stress/anxiety:yes, always Work/travel factors:no Impact: Daytime hypersomnolence: no Motor vehicle accident/motor dysfunction:no Treatment to date/efficacy: Melatonin      Review of Systems   She feels the pain  patch caused chest pain &  shortness of breath. She did fly to [facility] for a wedding  during the period 6/13-17. Her symptoms worsened after flying but she still believes it is related to the patch. CHEST PAIN: Location: SS  Quality: pressure  Duration: while wearing patch  Onset (rest, exertion): rest Radiation: no   Better with: no relievers  Worse with: exertion  Symptoms Nausea/vomiting: yes, nausea only  Diaphoresis: yes  Shortness of breath: yes  Pleuritic: no  Cough: yes, last week  Edema: yes,   PND: no  Dizziness: no  Palpitations: no  Syncope: no  Indigestion: yes, since patch   Red Flags Worse with exertion: yes  Recent Immobility: yes, in flight 6/13-17  Cancer history: no  Tearing/radiation to back: no        Objective:   Physical Exam General appearance is one of good health and nourishment w/o distress.  Eyes: No conjunctival inflammation or scleral icterus is present.  Oral exam: Dental hygiene is good; lips and gums are healthy appearing.There is no oropharyngeal erythema or exudate noted.   Heart:  Tachyarrhythmia   Lungs:Chest clear to auscultation; no wheezes, rhonchi,rales ,or rubs present.No increased work of breathing.   Abdomen: bowel  sounds normal, soft and non-tender without masses, organomegaly or hernias noted.  No guarding or rebound .  All pulses intact without  bruits .No ischemic skin changes.    Extremities:  No cyanosis, edema. Marked DJD deformities of fingers noted. Homan's negative. Skin:Warm & dry.  Intact without suspicious lesions or rashes ; no jaundice or tenting  Lymphatic: No lymphadenopathy is noted about the head, neck, axilla              Assessment & Plan:  #1 sleep disorder; she relates this to having been on a fentanyl patch  #2 chest pain and shortness of breath. An exact   history is difficult to ascertain. It appears that she states this stopped after she took off the patch 7-8 daysago . She does have some "soreness."  #3 new onset atrial fibrillation with heart rates up to 150.  EKG was reviewed with her husband. It is imperative that she go to the emergency room and be admitted. The heart rate this fast  could precipitate cardiac damage

## 2011-05-01 DIAGNOSIS — R079 Chest pain, unspecified: Secondary | ICD-10-CM

## 2011-05-01 DIAGNOSIS — I059 Rheumatic mitral valve disease, unspecified: Secondary | ICD-10-CM

## 2011-05-01 LAB — CARDIAC PANEL(CRET KIN+CKTOT+MB+TROPI)
CK, MB: 1.8 ng/mL (ref 0.3–4.0)
Relative Index: INVALID (ref 0.0–2.5)
Relative Index: INVALID (ref 0.0–2.5)
Total CK: 77 U/L (ref 7–177)
Troponin I: <0.3 ng/mL (ref <0.30)
Troponin I: <0.3 ng/mL (ref <0.30)

## 2011-05-01 LAB — TSH: TSH: 0.876 u[IU]/mL (ref 0.350–4.500)

## 2011-05-01 LAB — LIPID PANEL
HDL: 44 mg/dL (ref >39)
LDL Cholesterol: 71 mg/dL (ref 0–99)
Triglycerides: 186 mg/dL — ABNORMAL HIGH (ref <150)
VLDL: 37 mg/dL (ref 0–40)

## 2011-05-01 LAB — T4, FREE: Free T4: 1 ng/dL (ref 0.80–1.80)

## 2011-05-02 DIAGNOSIS — I4891 Unspecified atrial fibrillation: Secondary | ICD-10-CM

## 2011-05-07 LAB — CULTURE, BLOOD (ROUTINE X 2)
Culture  Setup Time: 201207110838
Culture: NO GROWTH
Culture: NO GROWTH

## 2011-05-09 NOTE — Consult Note (Signed)
[name] Kitchen  Bridget Lane, Bridget Lane NO.:  [id number]  MEDICAL RECORD NO.:  [id number]  LOCATION:  2021                         FACILITY:  [facility]  PHYSICIAN:  Arturo Morton. Riley Kill, MD, FACCDATE OF BIRTH:  1933-03-08  DATE OF CONSULTATION:  05/01/2011 DATE OF DISCHARGE:                                CONSULTATION   CHIEF COMPLAINT:  Not feeling right.  HISTORY OF PRESENT ILLNESS:  The patient describes a history of arthritis.  She went to see Dr. Alwyn Ren, complaining of arthritis pain. She has had a history of gastroesophageal reflux, taken care of by Dr. [name].  She did not want to take pills.  She was placed on a fentanyl patch, which she continued to take for about 30 days.  Shortly after she had the fentanyl patch put on, she developed a zoster rash, I forgot the timing of this correct.  In addition, she has felt miserable ever since the patch went on.  She developed severe chest pain involving the mid sternum all the way to the back.  She was miserable, developing nausea and heavy sweats.  She has been hyper.  She finally talk to the pharmacist, stopped the patch, and since that time, the chest pain has gradually eased off.  For 5 days it coming off, she was shaking and has had no sleep.  She apparently was confused and highly emotional.  She apparently has had some problems with codeine in the past.  On re- questioning, she clearly says the chest pain has been better since coming off the patch.  She was seen by Dr. Alwyn Ren in the office and noted to have a rapid rhythm, and by telemetry is noted to be in atrial fibrillation.  We have been called for consult, but an EKG has not yet been done.  The notable labs include the fact that the TSH is normal. She has lost about 10 pounds, and the symptoms sound somewhat suggestive of hyperthyroidism, but clearly the TSH is normal.  CKs x2 had been normal.  Lipid profile has been remarkable for an LDL of 71 at baseline without  statin treatment.  A D-dimer was borderline elevated at 0.63.  A CT angio of the chest was obtained because of that.  There was a normal caliber thoracic aorta and no significant lymphadenopathy.  There was some dependent atelectasis.  There was some degenerative changes in the thoracic spine and a 4-mm nodular opacity, not visualized on CT imaging. It was felt to be negative for pulmonary embolus.  PAST MEDICAL HISTORY:  Remarkable for no hypertension, no diabetes, no thyroid disease.  She has a long history of gastroesophageal reflux, but she said the pain this time is worse.  She has had prior surgery with hysterectomy, cholecystectomy, tubal ligation, and appendectomy.  She has also had a history of prior H. pylori.  She has taken melatonin at night for sleep.  ALLERGIES:  Include, CODEINE, PENICILLIN.  REVIEW OF SYSTEMS:  She has not had diarrhea.  She has had anorexia in the last 10 days associated with some nausea, all of that has improved slightly.  She describes about a 10-pound weight loss.  She has not  had a cough.  She has had a shingles rash located over the right chest area. The remainder of the review of systems is negative.  PHYSICAL EXAMINATION:  GENERAL:  Today, she is alert and oriented, and somewhat hyper.  She is pleasant and in no major distress. VITAL SIGNS:  The blood pressure is 140/70 by me, pulse is 90, and respiratory rate is 18. LUNGS:  Clear to auscultation and percussion.  There is a healing rash over the right chest.  PMI is not displaced. CARDIAC:  The cardiac rhythm is irregularly irregular without a definite murmur. ABDOMEN:  Soft.  There is no obvious hepatosplenomegaly. EXTREMITIES:  Reveal no edema. NEUROLOGIC:  Nonfocal.  Her cardiac panel is negative x3.  Other labs were as noted in the HPI.  IMPRESSION: 1. Chest pain with improving pattern, possibly secondary to fentanyl     reaction. 2. Recent narcotic withdrawal. 3. Atrial  fibrillation with moderately rapid ventricular response.     [place] score 1.  CHADS VASc 3. 4. Prior hysterectomy. 5. Prior cholecystectomy. 6. Prior tubal ligation. 7. Prior appendectomy. 8. Prior Helicobacter pylori.  RECOMMENDATIONS: 1. The patient probably can tolerate low-dose beta-blockade.  We will     start metoprolol 25 mg twice daily.  Because of her prior reactions     to drugs, this will need to be monitored, and the patient has been     informed appropriately. 2. With an improving chest pain pattern, I would not clearly be     inclined to recommend a cardiac catheterization.  If she were     convert to sinus rhythm, she would be a good candidate for CTA of     the coronary arteries to exclude underlying coronary artery disease     as the cause of her chest pain.  Her chest pain could have multiple     etiologies including known gastroesophageal reflux, and also     residual pain related to her shingles. 3. With regard to anticoagulation, she does not want to take Coumadin.     She is barely 77, and importantly has no other high-risk factors.     We will see how she responds to beta-blockade.     Arturo Morton. Riley Kill, MD, FACCTDS/MEDQ  D:  05/01/2011  T:  05/02/2011  Job:  [id number]  cc:   Titus Dubin. Alwyn Ren, MD,FACP,FCCP  Electronically Signed by Shawnie Pons MD [facility] on 05/09/2011 07:25:56 AM

## 2011-05-14 ENCOUNTER — Ambulatory Visit (INDEPENDENT_AMBULATORY_CARE_PROVIDER_SITE_OTHER): Payer: Medicare Other | Admitting: Cardiology

## 2011-05-14 ENCOUNTER — Encounter: Payer: Self-pay | Admitting: Cardiology

## 2011-05-14 VITALS — BP 100/62 | HR 91 | Ht 64.0 in | Wt 170.0 lb

## 2011-05-14 DIAGNOSIS — R079 Chest pain, unspecified: Secondary | ICD-10-CM

## 2011-05-14 DIAGNOSIS — I4891 Unspecified atrial fibrillation: Secondary | ICD-10-CM

## 2011-05-14 NOTE — Patient Instructions (Signed)
Your physician has requested that you have an exercise tolerance test. For further information please visit [email or web address] Please also follow instruction sheet, as given with Dr. Riley Kill.

## 2011-05-14 NOTE — Progress Notes (Signed)
HPI:  She is in for follow up.  She had a complex presentation to the hospital.  She had some chest pain.  She was in atrial fib, and was placed on rate control medications.    Had a difficult problem with a Fentanyl patch that was prescribed to her, then she stopped, with what sounded like withdrawal symptoms. She had severe chest pain on the Fentanyl patch, but that went away once she went off.  She was at the beach last week with no chest pain.  She had expressed a desire not to be on coumadin, but is amenable if we think that is what is best.  We have reviewed the scoring systems with her.    Current Outpatient Prescriptions  Medication Sig Dispense Refill  . ALPRAZolam (XANAX) 0.25 MG tablet Take 0.25 mg by mouth at bedtime as needed.        Marland Kitchen aspirin 325 MG EC tablet Take 325 mg by mouth daily.        . Melatonin 3 MG TABS Take by mouth.        . metoprolol tartrate (LOPRESSOR) 25 MG tablet Take 1 tablet by mouth Twice daily.      Marland Kitchen omeprazole (PRILOSEC) 40 MG capsule Take 1 tablet by mouth twice daily  60 capsule  3  . sertraline (ZOLOFT) 50 MG tablet Take 1 tablet by mouth Daily.      . Cholecalciferol (VITAMIN D3) 2000 UNITS TABS Take by mouth daily.        . Glucosamine-Chondroit-Vit C-Mn (GLUCOSAMINE 1500 COMPLEX PO) Take by mouth daily.        . Multiple Vitamin (MULTIVITAMIN) tablet Take 1 tablet by mouth daily.        . Multiple Vitamins-Minerals (OCUVITE PO) Take by mouth. 2 by mouth daily        . Omega-3 Fatty Acids (FISH OIL) 1000 MG CAPS Take by mouth daily.        . Probiotic Product (PROBIOTIC PO) Take by mouth daily.          Allergies  Allergen Reactions  . Acetaminophen     REACTION: hurts stomach  . Calcium Carbonate     REACTION: ? reaction  . Codeine     REACTION: ? hallucinations  . Fentanyl     Chest pain, dyspnea  . Penicillins     REACTION: rash  . Sulfonamide Derivatives     REACTION: rash    Past Medical History  Diagnosis Date  . Goiter   . OA  (osteoarthritis)   . DJD (degenerative joint disease)   . Macular degeneration   . Depression   . Colon polyp   . Esophageal stricture   . Hyperlipidemia   . H. pylori infection     Past Surgical History  Procedure Date  . Appendectomy   . Tubal ligation   . Cholecystectomy   . Total abdominal hysterectomy   . Esophageal dilation     x3  . Colonoscopy w/ polypectomy 2005,2010    Neg in 2010    Family History  Problem Relation Age of Onset  . Colon cancer Father   . Osteoarthritis Father   . Coronary artery disease Father   . Heart failure Mother   . Coronary artery disease Mother   . Diabetes Mother   . Breast cancer Sister   . Diabetes Sister   . Diabetes Sister   . Stroke Sister   . Heart attack Sister   . Coronary  artery disease Brother   . Throat cancer Brother   . Breast cancer Maternal Aunt     History   Social History  . Marital Status: Married    Spouse Name: N/A    Number of Children: N/A  . Years of Education: N/A   Occupational History  . Not on file.   Social History Main Topics  . Smoking status: Never Smoker   . Smokeless tobacco: Never Used  . Alcohol Use: No  . Drug Use: No  . Sexually Active: Not on file   Other Topics Concern  . Not on file   Social History Narrative  . No narrative on file    ROS: Please see the HPI.  All other systems reviewed and negative.  PHYSICAL EXAM:  Ht 5\' 4"  (1.626 m)  Wt 170 lb (77.111 kg)  BMI 29.18 kg/m2  General: Well developed, well nourished, in no acute distress. Head:  Normocephalic and atraumatic. Neck: no JVD Lungs: Clear to auscultation and percussion. Heart: Normal S1 and S2.  Irregularly, irregular without murmur, or rub.  Pulses: Pulses normal in all 4 extremities. Extremities: No clubbing or cyanosis. No edema. Neurologic: Alert and oriented x 3.  EKG:  Atrial fib with controlled ventricular response.    ECHO:   May 01, 2011: - Left ventricle: The cavity size was normal.  Wall thickness was     normal. Systolic function was normal. The estimated ejection     fraction was in the range of 55% to 60%.   - Mitral valve: Mild regurgitation.   - Left atrium: The atrium was mildly dilated.   - Right ventricle: RV appears dilated in 4 chamber view but less so     in subcostal image provided The cavity size was moderately     dilated.   - Right atrium: The atrium was moderately dilated.   - Atrial septum: No defect or patent foramen ovale was identified.  CT Angio of Chest: April 30, 2011   Findings:  Technically adequate study with good opacification of   the central and segmental pulmonary arteries.  No focal filling   defects demonstrated.  No evidence of significant pulmonary   embolus.  Normal caliber thoracic aorta.  No significant   lymphadenopathy in the chest.  No pleural effusions.  Dependent   atelectasis in the lungs.  No focal airspace consolidation.   Degenerative changes in the thoracic spine.  4 mm nodular opacity   demonstrated on previous chest x-ray is not visualized on CT and   may represent artifact.    Review of the MIP images confirms the above findings.    IMPRESSION:   No evidence of significant pulmonary embolus.        ASSESSMENT AND PLAN:

## 2011-05-16 ENCOUNTER — Ambulatory Visit (INDEPENDENT_AMBULATORY_CARE_PROVIDER_SITE_OTHER): Payer: Medicare Other | Admitting: Internal Medicine

## 2011-05-16 ENCOUNTER — Encounter: Payer: Self-pay | Admitting: Internal Medicine

## 2011-05-16 ENCOUNTER — Encounter: Payer: Self-pay | Admitting: Cardiology

## 2011-05-16 DIAGNOSIS — M79609 Pain in unspecified limb: Secondary | ICD-10-CM

## 2011-05-16 DIAGNOSIS — I4891 Unspecified atrial fibrillation: Secondary | ICD-10-CM

## 2011-05-16 DIAGNOSIS — M79606 Pain in leg, unspecified: Secondary | ICD-10-CM

## 2011-05-16 NOTE — Progress Notes (Signed)
  Subjective:    Patient ID: Bridget Lane, female    DOB: 05-Jun-1933, 77 y.o.   MRN: [id number]  HPI Bridget Lane  is here following her admission with atrial fibrillation. Hospital records were reviewed. Her d-dimer was mildly elevated at 0.63 but a CT angiogram revealed no pulmonary emboli. Cardiac enzymes were negative. LDL was at goal at 71. HDL is mildly reduced at 44.  Since discharge she denies chest pain, palpitations, tachycardia, dyspnea, paroxysmal nocturnal dyspnea, or edema.     Review of Systems   She's had slight cramping discomfort in the anterior shins at night. This improves when she gets up and ambulates.     Objective:   Physical Exam Gen.: Healthy and well-nourished in appearance. Alert, appropriate and cooperative throughout exam. Neck: No deformities, masses, or tenderness noted.  Thyroid normal. Lungs: Normal respiratory effort; chest expands symmetrically. Lungs are clear to auscultation without rales, wheezes, or increased work of breathing. Heart: Normal rate  But irregular  rhythm. No gallop, click,  rub,or  murmur. Musculoskeletal:  No clubbing, cyanosis, edema. Marked finger OA changes. Nail health  good . There is no significant tenderness over the tibia. Homans sign is negative. Vascular: Carotid, radial artery, dorsalis pedis and  posterior tibial pulses are full and equal. No bruits present. Neurologic: Alert and oriented x3. Deep tendon reflexes slightly asymmetrical @ knees but  normal.          Skin: Intact without suspicious lesions or rashes. Psych: Mood and affect are normal. Normally interactive                                                                                         Assessment & Plan:   #1 atrial fibrillation, adequate rate control  #2 nocturnal shin discomfort    Plan: Her cardiologist has scheduled a stress test to rule out coronary disease .  Isometric exercises and Mag/ Cal  would be recommended as needed for shin  discomfort.

## 2011-05-16 NOTE — Patient Instructions (Signed)
Isometric exercises prior to going to bed as discussed. Cal/Mag ( calcium/ magnesium) at bedtime as needed for leg discomfort. 

## 2011-05-18 NOTE — H&P (Signed)
NAME:  ALTAMESE, DEGUIRE NO.:  [id number]  MEDICAL RECORD NO.:  [id number]  LOCATION:  [facility]                         FACILITY:  [facility]  PHYSICIAN:  Houston Siren, MD           DATE OF BIRTH:  August 13, 1933  DATE OF ADMISSION:  04/30/2011 DATE OF DISCHARGE:                             HISTORY & PHYSICAL   PRIMARY CARE PHYSICIAN:  Dr. Alwyn Ren.  CARDIOLOGIST:  Rollene Rotunda, MD, [facility]  ADVANCE DIRECTIVE:  Full code.  REASON FOR ADMISSION:  Atrial fibrillation with rapid ventricular rate and chest pain.  HISTORY OF PRESENT ILLNESS:  This is a 77 year old female with history of GERD and goiter, but no known coronary artery disease, and otherwise on only a PPI, presents to her primary care physician, Dr. Alwyn Ren with substernal chest pain.  She was found to be in atrial fibrillation with rapid ventricular rate.  She denies any history of congestive heart failure, diabetes, hypertension, or prior CVA, which gave her a CHADS2 score of 1.  In the emergency room, her heart rate is 90-100, and she has no symptoms.  She denied any history of alcohol use and no family history of premature coronary artery disease, but she does have thyroid disease in her family.  Evaluation in the emergency room confirmed atrial fibrillation with rapid ventricular rate at 110.  No acute ST-T changes.  She also had elevated D-dimer of 0.63 with resultant CT pulmonary angiogram was negative for any PE.  There is a 4-mm nodule in her CT scan with recommendation for followup scan.  Her troponin is less than 0.3 and she has normal creatinine.  She has normal white count, hemoglobin of 13.1.  Hospitalist was asked to admit the patient for treatment of her new onset of atrial fibrillation with rapid ventricular rate.  PAST MEDICAL HISTORY:  As above.  ALLERGIES:  PENICILLIN, SULFA, and perhaps more.  CURRENT MEDICATIONS:  Prilosec.  FAMILY HISTORY:  Noncontributory.  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  She is married with 4 children, two daughters and two son and 16 grandchildren.  PHYSICAL EXAMINATION:  VITAL SIGNS:  Blood pressure 110/65, pulse of 80, respiratory rate 20, temperature 98.3. GENERAL:  Exam shows that she is alert and oriented and is in no apparent distress.  She has facial symmetry and fluent speech. NECK:  Supple.  Throat is clear.  There is a thyroid nodule on her right side, but no thyroid bruit. CARDIOVASCULAR:  S1 and S2 irregularly irregular with a soft 2/6 systolic ejection murmur along the left sternal border. LUNGS:  Clear.  No wheezes, rales, or any evidence of consolidation. ABDOMEN:  Soft, nondistended, nontender. EXTREMITIES:  No tremor.  She has good distal pulses bilaterally.  No calf tenderness. SKIN:  Warm and dry.  No evidence of peripheral shock. NEUROLOGIC:  Unremarkable. PSYCHIATRIC:  Unremarkable.  OBJECTIVE FINDINGS:  As above.  IMPRESSION:  This is a 77 year old female with benign past medical history except for goiter and gastroesophageal reflux disease with chest pain and atrial fibrillation with rapid ventricular rate, having a CHADS score of 1.  We will admit  her to telemetry.  She will be given aspirin. I will start her on Toprol 50 mg per day.  I would like to check her thyroid function tests and an echo.  We will rule out with serial CPKs and troponins.  Cardiology has already been consulted and said that they will see her in the morning.  She is a full code and will be admitted to [facility] Team 7.  She is stable.     Houston Siren, MD     PL/MEDQ  D:  05/01/2011  T:  05/01/2011  Job:  [id number]  Electronically Signed by Houston Siren  on 05/18/2011 02:45:42 AM

## 2011-05-23 ENCOUNTER — Other Ambulatory Visit: Payer: Self-pay | Admitting: Internal Medicine

## 2011-05-25 NOTE — Discharge Summary (Signed)
[name] Kitchen  Bridget Lane, Bridget Lane NO.:  [id number]  MEDICAL RECORD NO.:  [id number]  LOCATION:  2021                         FACILITY:  [facility]  PHYSICIAN:  Bridget Lane, M.D.     DATE OF BIRTH:  07-11-1933  DATE OF ADMISSION:  04/30/2011 DATE OF DISCHARGE:  05/02/2011                              DISCHARGE SUMMARY   PRIMARY CARE PHYSICIAN:  Dr. Quintella Lane.  CARDIOLOGIST:  Bridget Rotunda, MD, [facility]  PRESENTING COMPLAINT:  Brought in for chest pain noted to be in atrial fibrillation with RVR.  DISCHARGE DIAGNOSES: 1. Atrial fibrillation, now rate controlled. 2. Chest pain likely secondary to above.  PAST MEDICAL HISTORY:  Gastroesophageal reflux disease and insomnia.  DISCHARGE MEDICATIONS:  New medications: 1. Alprazolam 0.25 mg three times a day as needed for anxiety. 2. Aspirin 325 mg daily. 3. Metoprolol 25 mg twice a day. 4. Zoloft 50 mg daily.  Continue all other home meds as follows: 1. Melatonin 3 mg daily at bedtime. 2. Prilosec 40 mg twice a day.  Stop the following medication ibuprofen 200 mg 2 tablets at the evening.  CONSULT:  Cardiology consult.  The patient was evaluated by Dr. Riley Lane.  PROCEDURES:  Two-D echo performed May 02, 2011, interpretation; LV cavity size was normal, wall thickness was normal, systolic function was normal, EF was 55-60%.  There was mild mitral regurgitation.  Left atrium was mildly dilated.  Right atrium appeared to be dilated and right ventricle as well.  Atrial septum did not reveal any patent foramen ovale.  CT scan of chest with contrast was negative for pulmonary embolus. There is a 4 mm nodular opacity which had been demonstrated on previous x-ray which was not visualized on this CT and probably represented an artifact.  There was dependent atelectasis present.  Chest x-ray April 30, 2011, revealed emphysema without acute cardiopulmonary disease and a 4-mm right lung pulmonary nodule.  HOSPITAL COURSE:  This a  77 year old female with history of gastroesophageal reflux disease who presented to her primary care, physician Dr. Alwyn Lane with a complaint of substernal chest pain.  She was found to be in atrial fibrillation with RVR, CHADS score 2 and sent to the ER.  In the ER, she was started on p.o. Toprol.  Two sets of cardiac enzymes were performed and a Cardiology consult was requested.  On the following day, heart rate had improved to 85, BP was a little bit low and metoprolol had been held.  Thyroid studies were also performed and found to be normal.  The patient was eventually able to tolerate metoprolol and heart rate has been stable on this and she is therefore being discharged home on the above-mentioned dose.  She has not had any further chest pain.  Further workup of chest pain included a CTA of the chest which was negative.  PHYSICAL EXAMINATION:  HEART:  Irregular rate and rhythm.  No murmurs. LUNGS:  Clear bilaterally. ABDOMEN:  Soft, nontender, nondistended.  Bowel sounds positive. EXTREMITIES:  No cyanosis, clubbing or edema.She has followup recommended with Dr. Riley Lane.  The patient has opted not to take Coumadin and therefore has been placed on aspirin.  CONDITION ON DISCHARGE:  Stable.  She is to follow up as well with her primary care physician, Dr. Alwyn Lane in 1 week.  Time on patient care today was 45 minutes.     Bridget Lane, M.D.     SR/MEDQ  D:  05/24/2011  T:  05/24/2011  Job:  [id number]  cc:   Dr. Alwyn Lane  Electronically Signed by Bridget Lane M.D. on 05/25/2011 04:54:09 PM

## 2011-06-03 ENCOUNTER — Encounter: Payer: Self-pay | Admitting: Family Medicine

## 2011-06-03 ENCOUNTER — Ambulatory Visit (INDEPENDENT_AMBULATORY_CARE_PROVIDER_SITE_OTHER): Payer: Medicare Other | Admitting: Family Medicine

## 2011-06-03 VITALS — BP 108/70 | Temp 98.6°F | Wt 169.2 lb

## 2011-06-03 DIAGNOSIS — J069 Acute upper respiratory infection, unspecified: Secondary | ICD-10-CM

## 2011-06-03 MED ORDER — BENZONATATE 200 MG PO CAPS: 200.0000 mg | ORAL_CAPSULE | Freq: Three times a day (TID) | ORAL | Status: AC | PRN

## 2011-06-03 MED ORDER — AZITHROMYCIN 250 MG PO TABS: 250.0000 mg | ORAL_TABLET | Freq: Every day | ORAL | Status: AC

## 2011-06-03 NOTE — Assessment & Plan Note (Signed)
Start Zpack as pt is very concerned about making it to a wedding this weekend.  Reviewed that this is likely viral, and if so- the abx won't provide relief, but either way, she should be improved by Saturday.  Start Tessalon prn for cough.  Reviewed supportive care and red flags that should prompt return.  Pt expressed understanding and is in agreement w/ plan.

## 2011-06-03 NOTE — Patient Instructions (Signed)
Take the Azithromycin as directed Drink plenty of fluids Use the cough pills as needed Continue the Mucinex Take tylenol for pain relief REST! Hang in there!

## 2011-06-03 NOTE — Progress Notes (Signed)
  Subjective:    Patient ID: Bridget Lane, female    DOB: 1933/03/02, 77 y.o.   MRN: [id number]  HPI URI- sxs started 3-4 days ago.  Having very sore throat, 'coughed all night', nasal congestion.  Cough is productive- taking mucinex.  Denies facial pain or pressure, ear pain.  Pt w/ subjective low grade temps.  + sick contacts.   Review of Systems For ROS see HPI     Objective:   Physical Exam  Vitals reviewed. Constitutional: She appears well-developed and well-nourished. No distress.  HENT:  Head: Normocephalic and atraumatic.       TMs normal bilaterally Mild nasal congestion Throat w/out erythema, edema, or exudate No TTP over sinuses  Eyes: Conjunctivae and EOM are normal. Pupils are equal, round, and reactive to light.  Neck: Normal range of motion. Neck supple.  Cardiovascular: Normal rate and intact distal pulses.   Murmur heard. Pulmonary/Chest: Effort normal and breath sounds normal. No respiratory distress. She has no wheezes.       + hacking cough  Lymphadenopathy:    She has no cervical adenopathy.          Assessment & Plan:

## 2011-06-14 DIAGNOSIS — R079 Chest pain, unspecified: Secondary | ICD-10-CM | POA: Insufficient documentation

## 2011-06-14 DIAGNOSIS — I4891 Unspecified atrial fibrillation: Secondary | ICD-10-CM | POA: Insufficient documentation

## 2011-06-14 NOTE — Assessment & Plan Note (Signed)
She now has rate control, and remains in atrial fib.  Please see her underlying echocardiogram.  Rate control is with metoprolol.  Her CHADS 2 score is 1, and CHADSVASC-2 is 3 which places her thromboembolic risk somewhere between 2.8 and 3.2%.  She is on ASA as prescribed by prior studies, and does have a desire to not to take warfarin if at all possible.  However, she is willing to do if it is felt necessary.  She will remain on ASA for now.

## 2011-06-14 NOTE — Assessment & Plan Note (Signed)
Patient had chest pain when she presented with her atrial fibrillation.  She is at an age when she could have underlying CAD, or it could have been related to the rate.  She is not unstable, and her symptoms have clearly resolved.  There are not dynamic ECG changes.  Since she is back to most of her activities, it seems to be prudent to consider an exercise test to evaluate her symptoms.  If neg, continue to monitor.

## 2011-06-19 ENCOUNTER — Ambulatory Visit (INDEPENDENT_AMBULATORY_CARE_PROVIDER_SITE_OTHER): Payer: Medicare Other | Admitting: Cardiology

## 2011-06-19 DIAGNOSIS — R0989 Other specified symptoms and signs involving the circulatory and respiratory systems: Secondary | ICD-10-CM

## 2011-06-19 DIAGNOSIS — I4891 Unspecified atrial fibrillation: Secondary | ICD-10-CM

## 2011-06-19 DIAGNOSIS — R079 Chest pain, unspecified: Secondary | ICD-10-CM

## 2011-06-19 NOTE — Patient Instructions (Signed)
Your physician recommends that you schedule a follow-up appointment in: SEE DR Riley Kill  SAME DAY AS MYOVIEW   Your physician recommends that you continue on your current medications as directed. Please refer to the Current Medication list given to you today. Your physician has requested that you have a lexiscan myoview. For further information please visit [email or web address] Please follow instruction sheet, as given.  WED . 06-26-11  SEE DR Riley Kill SAME DAY

## 2011-06-21 ENCOUNTER — Encounter: Payer: Self-pay | Admitting: *Deleted

## 2011-06-26 ENCOUNTER — Ambulatory Visit (HOSPITAL_COMMUNITY): Payer: Medicare Other | Attending: Cardiology | Admitting: Radiology

## 2011-06-26 ENCOUNTER — Ambulatory Visit (INDEPENDENT_AMBULATORY_CARE_PROVIDER_SITE_OTHER): Payer: Medicare Other | Admitting: Cardiology

## 2011-06-26 ENCOUNTER — Encounter: Payer: Self-pay | Admitting: Cardiology

## 2011-06-26 DIAGNOSIS — R079 Chest pain, unspecified: Secondary | ICD-10-CM | POA: Insufficient documentation

## 2011-06-26 DIAGNOSIS — I4891 Unspecified atrial fibrillation: Secondary | ICD-10-CM

## 2011-06-26 DIAGNOSIS — R0789 Other chest pain: Secondary | ICD-10-CM

## 2011-06-26 DIAGNOSIS — E785 Hyperlipidemia, unspecified: Secondary | ICD-10-CM

## 2011-06-26 MED ORDER — REGADENOSON 0.4 MG/5ML IV SOLN
0.4000 mg | Freq: Once | INTRAVENOUS | Status: AC
  Administered 2011-06-26: 0.4 mg via INTRAVENOUS

## 2011-06-26 MED ORDER — TECHNETIUM TC 99M TETROFOSMIN IV KIT
10.8000 | PACK | Freq: Once | INTRAVENOUS | Status: AC | PRN
  Administered 2011-06-26: 11 via INTRAVENOUS

## 2011-06-26 MED ORDER — TECHNETIUM TC 99M TETROFOSMIN IV KIT
33.0000 | PACK | Freq: Once | INTRAVENOUS | Status: AC | PRN
  Administered 2011-06-26: 33 via INTRAVENOUS

## 2011-06-26 NOTE — Patient Instructions (Signed)
Return to see Dr.Stuckey in  6 weeks.

## 2011-06-26 NOTE — Progress Notes (Signed)
[facility] SITE 3 NUCLEAR MED [address] [place] [address] [phone]  Cardiology Nuclear Med Study  Bridget Lane is a 77 y.o. female [id number] 1933/08/26   Nuclear Med Background Indication for Stress Test:  Evaluation for Ischemia History:  '06 BMW:UXLKGM, EF=65%; 05/01/11 Echo:EF=55-60%; 06/19/11 GXT:CX d/t atrial fib with RVR; H/O atrial fib. Cardiac Risk Factors: Family History - CAD and Lipids  Symptoms:  Chest Pressure.  (last episode of chest discomfort was about 2-days ago, but sore to touch now), Diaphoresis, Fatigue and Rapid HR   Nuclear Pre-Procedure Caffeine/Decaff Intake:  None NPO After: 10:00pm   Lungs:  Clear.  O2 Sat 99% on RA. IV 0.9% NS with Angio Cath:  20g  IV Site: R Forearm  IV Started by:  Stanton Kidney, EMT-P  Chest Size (in):  36 Cup Size: B  Height: 5\' 5"  (1.651 m)  Weight:  168 lb (76.204 kg)  BMI:  Body mass index is 27.96 kg/(m^2). Tech Comments:  Lopressor not held , per patient.    Nuclear Med Study 1 or 2 day study: 1 day  Stress Test Type:  Treadmill/Lexiscan  Reading MD: Kristeen Miss MD  Order Authorizing Provider:  Shawnie Pons, MD  Resting Radionuclide: Technetium 22m Tetrofosmin  Resting Radionuclide Dose: 10.8 mCi   Stress Radionuclide:  Technetium 4m Tetrofosmin  Stress Radionuclide Dose: 33 mCi           Stress Protocol Rest HR: 75 Stress HR: 129  Rest BP: 97/80 Stress BP: 127/61  Exercise Time (min): 2:00 METS: n/a   Predicted Max HR: 144 bpm % Max HR: 89.58 bpm Rate Pressure Product: 01027   Dose of Adenosine (mg):  n/a Dose of Lexiscan: 0.4 mg  Dose of Atropine (mg): n/a Dose of Dobutamine: n/a mcg/kg/min (at max HR)  Stress Test Technologist: Smiley Houseman, CMA-N  Nuclear Technologist:  Domenic Polite, CNMT     Rest Procedure:  Myocardial perfusion imaging was performed at rest 45 minutes following the intravenous administration of Technetium 74m Tetrofosmin.  Rest ECG: Atrial  Fibrilliation  Stress Procedure:  The patient received IV Lexiscan 0.4 mg over 15-seconds with concurrent low level exercise and then Technetium 38m Tetrofosmin was injected at 30-seconds while the patient continued walking one more minute.  There were nonspecific ST-T wave changes with Lexiscan.  She did c/o chest pressure with infusion.  Quantitative spect images were obtained after a 45-minute delay.  Stress ECG: There are no significant ECG changes from baseline.  QPS Raw Data Images:  Normal; no motion artifact; normal heart/lung ratio. Stress Images:  Normal homogeneous uptake in all areas of the myocardium. Rest Images:  Normal homogeneous uptake in all areas of the myocardium. Subtraction (SDS):  No evidence of ischemia. Transient Ischemic Dilatation (Normal <1.22):  .97 Lung/Heart Ratio (Normal <0.45):  .33   Quantitative Gated Spect Images QGS EDV:  77 ml QGS ESV:  30 ml QGS cine images:  NL LV Function; NL Wall Motion QGS EF: 61%  Impression Exercise Capacity:  Lexiscan with low level exercise. BP Response:  Normal blood pressure response. Clinical Symptoms:  No chest pain. ECG Impression:  No significant ST segment change suggestive of ischemia. Comparison with Prior Nuclear Study: No images to compare  Overall Impression:  Normal stress nuclear study.  No evidence of ischemia.  Normal LV function.    Vesta Mixer, Montez Hageman., MD, [facility]

## 2011-06-30 NOTE — Progress Notes (Signed)
HPI:  She underwent a nuclear study today and we discussed in detail.  We reviewed the images.  She has atrial fibrillation.  Her nuc scan is non ischemic.  She is doing well.  She remains stable now down the road from presentation.  She is in the crossover zone between treatment and non treatment for her atrial fib prophylaxis, and is considering the options.    Current Outpatient Prescriptions  Medication Sig Dispense Refill  . aspirin 325 MG EC tablet Take 325 mg by mouth daily.        . Calcium Carbonate (CALTRATE 600) 1500 MG TABS Take 1 tablet by mouth daily.        . Cholecalciferol (VITAMIN D3) 2000 UNITS TABS Take by mouth daily.        Marland Kitchen dicyclomine (BENTYL) 20 MG tablet Take 20 mg by mouth 2 (two) times daily. On hold per patient       . Glucosamine-Chondroit-Vit C-Mn (GLUCOSAMINE 1500 COMPLEX PO) Take by mouth daily.        . Melatonin 3 MG TABS Take by mouth.        . metoprolol tartrate (LOPRESSOR) 25 MG tablet TAKE 1 TABLET BY MOUTH TWICE A DAY  60 tablet  5  . Multiple Vitamin (MULTIVITAMIN) tablet Take 1 tablet by mouth daily.        . Multiple Vitamins-Minerals (OCUVITE PO) Take by mouth. 2 by mouth daily        . omeprazole (PRILOSEC) 40 MG capsule Take 1 tablet by mouth twice daily  60 capsule  3  . Probiotic Product (PROBIOTIC PO) Take by mouth daily.        . sertraline (ZOLOFT) 50 MG tablet TAKE 1 TABLET BY MOUTH DAILY  30 tablet  5    Allergies  Allergen Reactions  . Acetaminophen     REACTION: hurts stomach  . Calcium Carbonate     REACTION: ? reaction  . Codeine     REACTION: ? hallucinations  . Fentanyl     Chest pain, dyspnea  . Penicillins     REACTION: rash  . Sulfonamide Derivatives     REACTION: rash    Past Medical History  Diagnosis Date  . Goiter   . OA (osteoarthritis)   . DJD (degenerative joint disease)   . Macular degeneration   . Depression   . Colon polyp   . Esophageal stricture   . Hyperlipidemia   . H. pylori infection   . GERD  (gastroesophageal reflux disease)   . Routine general medical examination at a health care facility   . Nontoxic multinodular goiter   . Hiatal hernia     Past Surgical History  Procedure Date  . Appendectomy   . Tubal ligation   . Cholecystectomy   . Total abdominal hysterectomy   . Esophageal dilation     x3  . Colonoscopy w/ polypectomy 2005,2010    Neg in 2010    Family History  Problem Relation Age of Onset  . Colon cancer Father   . Osteoarthritis Father   . Coronary artery disease Father   . Cancer Father     colon cancer  . Heart failure Mother   . Coronary artery disease Mother   . Diabetes Mother   . Breast cancer Sister   . Diabetes Sister   . Diabetes Sister   . Stroke Sister   . Heart attack Sister   . Coronary artery disease Brother   . Throat  cancer Brother   . Breast cancer Maternal Aunt     History   Social History  . Marital Status: Married    Spouse Name: N/A    Number of Children: N/A  . Years of Education: N/A   Occupational History  . Not on file.   Social History Main Topics  . Smoking status: Never Smoker   . Smokeless tobacco: Never Used  . Alcohol Use: No  . Drug Use: No  . Sexually Active: Not on file   Other Topics Concern  . Not on file   Social History Narrative  . No narrative on file    ROS: Please see the HPI.  All other systems reviewed and negative.  PHYSICAL EXAM:  BP 106/62  Pulse 68  General: Well developed, well nourished, in no acute distress. Head:  Normocephalic and atraumatic. Neck: no JVD Lungs: Clear to auscultation and percussion. Heart: Normal S1 and S2.  No murmur, rubs or gallops.  iregg ireg rhythm.  Abdomen:  Normal bowel sounds; soft; non tender; no organomegaly Pulses: Pulses normal in all 4 extremities. Extremities: No clubbing or cyanosis. No edema. Neurologic: Alert and oriented x 3.  EKG:  See nuc report.   ASSESSMENT AND PLAN:

## 2011-07-02 ENCOUNTER — Ambulatory Visit (INDEPENDENT_AMBULATORY_CARE_PROVIDER_SITE_OTHER): Payer: Medicare Other | Admitting: Internal Medicine

## 2011-07-02 ENCOUNTER — Encounter: Payer: Self-pay | Admitting: Internal Medicine

## 2011-07-02 DIAGNOSIS — K219 Gastro-esophageal reflux disease without esophagitis: Secondary | ICD-10-CM

## 2011-07-02 DIAGNOSIS — Z0000 Encounter for general adult medical examination without abnormal findings: Secondary | ICD-10-CM

## 2011-07-02 DIAGNOSIS — I4891 Unspecified atrial fibrillation: Secondary | ICD-10-CM

## 2011-07-02 DIAGNOSIS — Z23 Encounter for immunization: Secondary | ICD-10-CM

## 2011-07-02 DIAGNOSIS — E785 Hyperlipidemia, unspecified: Secondary | ICD-10-CM

## 2011-07-02 DIAGNOSIS — R7309 Other abnormal glucose: Secondary | ICD-10-CM

## 2011-07-02 LAB — BASIC METABOLIC PANEL
BUN: 19 mg/dL (ref 6–23)
CO2: 29 mEq/L (ref 19–32)
Calcium: 9.4 mg/dL (ref 8.4–10.5)
Glucose, Bld: 95 mg/dL (ref 70–99)
Sodium: 142 mEq/L (ref 135–145)

## 2011-07-02 LAB — CBC WITH DIFFERENTIAL/PLATELET
Basophils Absolute: 0 10*3/uL (ref 0.0–0.1)
Eosinophils Absolute: 0.1 10*3/uL (ref 0.0–0.7)
HCT: 38.7 % (ref 36.0–46.0)
Hemoglobin: 12.9 g/dL (ref 12.0–15.0)
Lymphocytes Relative: 29.5 % (ref 12.0–46.0)
Lymphs Abs: 2.2 10*3/uL (ref 0.7–4.0)
MCHC: 33.3 g/dL (ref 30.0–36.0)
Neutro Abs: 4.7 10*3/uL (ref 1.4–7.7)
RDW: 14.6 % (ref 11.5–14.6)

## 2011-07-02 LAB — HEPATIC FUNCTION PANEL
ALT: 19 U/L (ref 0–35)
AST: 23 U/L (ref 0–37)
Bilirubin, Direct: 0.1 mg/dL (ref 0.0–0.3)
Total Bilirubin: 0.8 mg/dL (ref 0.3–1.2)

## 2011-07-02 MED ORDER — TETANUS-DIPHTH-ACELL PERTUSSIS 5-2.5-18.5 LF-MCG/0.5 IM SUSP
0.5000 mL | Freq: Once | INTRAMUSCULAR | Status: AC
  Administered 2011-07-02: 0.5 mL via INTRAMUSCULAR

## 2011-07-02 NOTE — Progress Notes (Signed)
Subjective:    Patient ID: Bridget Lane, female    DOB: Aug 02, 1933, 77 y.o.   MRN: [id number]  HPI Medicare Wellness Visit:  The following psychosocial & medical history were reviewed as required by Medicare.   Social history: caffeine: 1 bar of chocolate 2-3X/ week , alcohol:  no ,  tobacco use : never  & exercise : walking 3X/ week for 30-60 min.   Home & personal  safety / fall risk: no issues, activities of daily living: no limitations , seatbelt use : yes , and smoke alarm employment : yes .  Power of Attorney/Living Will status : needed  Vision ( as recorded per Nurse) & Hearing  evaluation :  Last Opth exam 6 mos ago for cataract  Monitor.Whisper heard @ 6 ft Orientation :oriented X3 , memory & recall :good, spelling & math testing: excellent ,and mood & affect : normal . Depression / anxiety: some , Zoloft 50 mg not helping Travel history : 2002 [place] , immunization status :Shingles needed , transfusion history:  no, and preventive health surveillance ( colonoscopies, BMD , etc as per protocol/ [facility]): colonoscopy up to date, Dental care:  Every 6 mos . Chart reviewed &  Updated. Active issues reviewed & addressed.       Review of Systems   She continues to have significant reflux symptoms despite omeprazole 40 mg twice a day. She continues to have intermittent pain and some dysphagia. She denies rectal bleeding or melena.  The cardiology office visit 06/26/11 was reviewed. Presently she is on enteric-coated aspirin 325 mg daily. A decision will be made as to whether cardioversion and Coumadin are pursued.       Objective:   Physical Exam Gen.: Healthy and well-nourished in appearance. Alert, appropriate and cooperative throughout exam. Head: Normocephalic without obvious abnormalities Eyes: No corneal or conjunctival inflammation noted. Pupils equal round reactive to light and accommodation. Extraocular motion intact.  Ears: External  ear exam reveals no significant lesions or  deformities. Canals clear .TMs normal. Nose: External nasal exam reveals no deformity or inflammation. Nasal mucosa are pink and moist. No lesions or exudates noted. Mouth: Oral mucosa and oropharynx reveal no lesions or exudates. Teeth in good repair; upper partial. Neck: No deformities, masses, or tenderness noted. Range of motion normal. Thyroid R firm w/o enlargement; L small. Lungs: Normal respiratory effort; chest expands symmetrically. Lungs are clear to auscultation without rales, wheezes, or increased work of breathing. Heart: Normal rate and irregular  rhythm. Heart sounds distant Abdomen: Bowel sounds normal; abdomen soft and nontender. No masses, organomegaly or hernias noted. Genitalia: as per Gyn  .                                                                                   Musculoskeletal/extremities: Lordosis  noted of  the thoracic  spine. No clubbing, cyanosis, edema  noted. Range of motion  normal .Tone & strength  Normal.Joints:marked OA hand changes. Nail health  good. Vascular: Carotid, radial artery, dorsalis pedis and  posterior tibial pulses are full and equal. No bruits present. Neurologic: Alert and oriented x3. Deep tendon reflexes symmetrical and normal.  Skin: Intact without suspicious lesions or rashes. Lymph: No cervical, axillary  lymphadenopathy present. Psych: Mood and affect are normal. Normally interactive                                                                                        Assessment & Plan:  #1 Medicare Wellness Exam; criteria met ; data entered #2 Problem List reviewed ; Assessment/ Recommendations made  #3 GERD with abdominal pain and intermittent dysphasia. Because of the enteric-coated aspirin which she is taking now, and the possible initiation of Coumadin; I feel GI reevaluation is prudent.  #4 depression, situational. The Zoloft 50 mg daily will be increased to one & 1/2  half daily as a trial.   Plan: see  Orders

## 2011-07-02 NOTE — Patient Instructions (Addendum)
Preventive Health Care: Exercise  30-45  minutes a day, 3-4 days a week. Walking is especially valuable in preventing Osteoporosis. Eat a low-fat diet with lots of fruits and vegetables, up to 7-9 servings per day. Consume less than 30 grams of sugar per day from foods & drinks with High Fructose Corn Syrup as # 1,2,3 or #4 on label. Health Care Power of Attorney & Living Will place you in charge of your health care  decisions. Verify these are  in place. The triggers for dyspepsia or "heart burn"  include stress; the "aspirin family" ; alcohol; peppermint; and caffeine (coffee, tea, cola, and chocolate). The aspirin family would include aspirin and the nonsteroidal agents such as ibuprofen &  Naproxen. Tylenol would not cause reflux. If having dyspepsia ; food & drink should be avoided for @ least 2 hours before going to bed.  Please increase the Zoloft 50 mg to 1&1/2 pills daily.

## 2011-07-08 ENCOUNTER — Telehealth: Payer: Self-pay

## 2011-07-08 ENCOUNTER — Telehealth: Payer: Self-pay | Admitting: Cardiology

## 2011-07-08 DIAGNOSIS — I4891 Unspecified atrial fibrillation: Secondary | ICD-10-CM

## 2011-07-08 NOTE — Telephone Encounter (Signed)
Patient left message on voicemail: Patient would like for Dr.Hopper to give her a new rx for Zoloft with increased dose. Patient was told at last OV to increase Zoloft to 1 1/2 tab daily, patient states " It is too hard to cut medication in half's"   Dr.Hopper please advise on dose of tab (patient is taking 75mg  daily ), currently on 50mg  1 1/2 daily.

## 2011-07-08 NOTE — Telephone Encounter (Signed)
The pt states that she would like to get started on coumadin so that she can proceed with DCCV.  The pt complains of leg cramps and stomach pain and has seen the PCP for these symptoms. I will discuss this pt with Dr Riley Kill for further orders.

## 2011-07-08 NOTE — Telephone Encounter (Signed)
Pt calling re wanting to go ahead with coumadin and prcedure dr Riley Kill wanted her to decide to do

## 2011-07-09 NOTE — Telephone Encounter (Signed)
I spoke with Dr Riley Kill and he said that he would call the pt.

## 2011-07-10 ENCOUNTER — Other Ambulatory Visit: Payer: Self-pay | Admitting: Internal Medicine

## 2011-07-10 MED ORDER — SERTRALINE HCL 50 MG PO TABS: ORAL_TABLET | ORAL | Status: DC

## 2011-07-10 NOTE — Assessment & Plan Note (Signed)
Thromboprophylaxis options again reviewed.  She is in the categories as noted.  We reviewed her CHADS and CHADS VASC 2 options.  She will think about this.  If so, she could go on warfarin, or one of the other agents, but I probably would lean toward warfarin with goal of INR of 2.0-2.5.

## 2011-07-10 NOTE — Assessment & Plan Note (Signed)
Borderline elevation.  Will defer treatment options to Dr. Alwyn Ren.

## 2011-07-10 NOTE — Assessment & Plan Note (Signed)
See nuc study results.

## 2011-07-10 NOTE — Telephone Encounter (Signed)
I called patient, and informed her Per Dr.Hopper she can increase to 100 mg, Zoloft does not come in a 75 mg tab.  Patient preferred to stick with 50 mg 1 1/2 tab daily . RX sent to pharmacy

## 2011-07-11 NOTE — Telephone Encounter (Addendum)
Bridget Lane She can come in to the coumadin clinic. TS   Per Dr Riley Kill have the pt start Coumadin 2.5mg  daily and follow-up in the coumadin clinic.

## 2011-07-15 MED ORDER — WARFARIN SODIUM 2.5 MG PO TABS: 2.5000 mg | ORAL_TABLET | Freq: Every day | ORAL | Status: DC

## 2011-07-15 NOTE — Telephone Encounter (Signed)
I spoke with the pt and made her aware that Dr Riley Kill would like her to start Warfarin 2.5mg  daily (Rx sent to pharmacy).  I gave the pt information about this medication and about the foods that effect this medication. The pt is scheduled to see the Coumadin Clinic 07/19/11 at 11:45. The pt will also decrease her Aspirin to 81mg   daily.

## 2011-07-19 ENCOUNTER — Ambulatory Visit (INDEPENDENT_AMBULATORY_CARE_PROVIDER_SITE_OTHER): Payer: Medicare Other | Admitting: *Deleted

## 2011-07-19 DIAGNOSIS — Z7901 Long term (current) use of anticoagulants: Secondary | ICD-10-CM | POA: Insufficient documentation

## 2011-07-19 DIAGNOSIS — I4891 Unspecified atrial fibrillation: Secondary | ICD-10-CM

## 2011-07-24 NOTE — Progress Notes (Signed)
HPI: Did not have ETT today.  Plan exercise myoview. Current Outpatient Prescriptions  Medication Sig Dispense Refill  . aspirin 81 MG EC tablet Take 1 tablet (81 mg total) by mouth daily.      . Calcium Carbonate (CALTRATE 600) 1500 MG TABS Take 1 tablet by mouth daily.        . Cholecalciferol (VITAMIN D3) 2000 UNITS TABS Take by mouth daily.        . Glucosamine-Chondroit-Vit C-Mn (GLUCOSAMINE 1500 COMPLEX PO) Take by mouth daily.        . Melatonin 3 MG TABS Take by mouth.        . metoprolol tartrate (LOPRESSOR) 25 MG tablet TAKE 1 TABLET BY MOUTH TWICE A DAY  60 tablet  5  . Multiple Vitamin (MULTIVITAMIN) tablet Take 1 tablet by mouth daily.        . Multiple Vitamins-Minerals (OCUVITE PO) Take by mouth. 2 by mouth daily        . omeprazole (PRILOSEC) 40 MG capsule TAKE ONE CAPSULE BY MOUTH TWICE A DAY  60 capsule  3  . Probiotic Product (PROBIOTIC PO) Take by mouth daily.        . sertraline (ZOLOFT) 50 MG tablet 1 1/2 by mouth daily  45 tablet  5  . warfarin (COUMADIN) 2.5 MG tablet Take 1 tablet (2.5 mg total) by mouth daily.  30 tablet  3    Allergies  Allergen Reactions  . Penicillins     REACTION: rash  . Sulfonamide Derivatives     REACTION: rash  . Codeine     REACTION: ? hallucinations  . Calcium Carbonate     REACTION: ? Rash with TUMS  . Fentanyl     Chest pain, dyspnea  . Acetaminophen     REACTION: hurts stomach    Past Medical History  Diagnosis Date  . Goiter   . OA (osteoarthritis)   . DJD (degenerative joint disease)   . Macular degeneration   . Depression   . Colon polyp   . Esophageal stricture   . Hyperlipidemia   . H. pylori infection   . GERD (gastroesophageal reflux disease)   . Routine general medical examination at a health care facility   . Nontoxic multinodular goiter   . Hiatal hernia     Past Surgical History  Procedure Date  . Appendectomy   . Tubal ligation     with appendectomy  . Cholecystectomy 1989  . Total abdominal  hysterectomy 1972    for pain  . Esophageal dilation     > 3 X; Dr Juanda Chance  . Colonoscopy w/ polypectomy 2005    Neg in 2010    Family History  Problem Relation Age of Onset  . Colon cancer Father   . Osteoarthritis Father   . Coronary artery disease Father   . Heart failure Mother   . Coronary artery disease Mother   . Diabetes Mother   . Breast cancer Sister   . Diabetes Sister   . Stroke Sister   . Heart attack Sister   . Coronary artery disease Brother   . Throat cancer Brother     smoked  . Breast cancer Maternal Aunt     History   Social History  . Marital Status: Married    Spouse Name: N/A    Number of Children: N/A  . Years of Education: N/A   Occupational History  . Not on file.   Social History Main Topics  .  Smoking status: Never Smoker   . Smokeless tobacco: Never Used  . Alcohol Use: No  . Drug Use: No  . Sexually Active: Not on file   Other Topics Concern  . Not on file   Social History Narrative  . No narrative on file    ROS: Please see the HPI.  All other systems reviewed and negative.  PHYSICAL EXAM:    ASSESSMENT AND PLAN:  Will arrange a stress myoview, and see her back in a week to arrange.  TS

## 2011-07-26 ENCOUNTER — Encounter: Payer: Medicare Other | Admitting: *Deleted

## 2011-07-29 ENCOUNTER — Ambulatory Visit (INDEPENDENT_AMBULATORY_CARE_PROVIDER_SITE_OTHER): Payer: Medicare Other | Admitting: *Deleted

## 2011-07-29 DIAGNOSIS — I4891 Unspecified atrial fibrillation: Secondary | ICD-10-CM

## 2011-07-29 DIAGNOSIS — Z7901 Long term (current) use of anticoagulants: Secondary | ICD-10-CM

## 2011-07-29 LAB — POCT INR: INR: 5.3

## 2011-08-06 ENCOUNTER — Ambulatory Visit (INDEPENDENT_AMBULATORY_CARE_PROVIDER_SITE_OTHER): Payer: Medicare Other | Admitting: *Deleted

## 2011-08-06 DIAGNOSIS — Z7901 Long term (current) use of anticoagulants: Secondary | ICD-10-CM

## 2011-08-06 DIAGNOSIS — I4891 Unspecified atrial fibrillation: Secondary | ICD-10-CM

## 2011-08-06 LAB — POCT INR: INR: 2

## 2011-08-06 MED ORDER — WARFARIN SODIUM 2.5 MG PO TABS: ORAL_TABLET | ORAL | Status: DC

## 2011-08-09 ENCOUNTER — Ambulatory Visit (INDEPENDENT_AMBULATORY_CARE_PROVIDER_SITE_OTHER): Payer: Medicare Other | Admitting: Cardiology

## 2011-08-09 ENCOUNTER — Encounter: Payer: Self-pay | Admitting: Cardiology

## 2011-08-09 VITALS — BP 120/78 | HR 88 | Ht 64.0 in | Wt 170.0 lb

## 2011-08-09 DIAGNOSIS — I4891 Unspecified atrial fibrillation: Secondary | ICD-10-CM

## 2011-08-09 DIAGNOSIS — E785 Hyperlipidemia, unspecified: Secondary | ICD-10-CM

## 2011-08-09 MED ORDER — METOPROLOL TARTRATE 25 MG PO TABS: ORAL_TABLET | ORAL | Status: DC

## 2011-08-09 NOTE — Progress Notes (Signed)
HPI:  Bridget Lane is in for follow up. She is tolerating her meds fairly well.  We have discussed her treatment regimen in detail, and discussed her anticoagulation strategy.  She is bruising fairly easily.  Current Outpatient Prescriptions  Medication Sig Dispense Refill  . aspirin 81 MG EC tablet Take 1 tablet (81 mg total) by mouth daily.      . Calcium Carbonate (CALTRATE 600) 1500 MG TABS Take 1 tablet by mouth daily.        . Glucosamine-Chondroit-Vit C-Mn (GLUCOSAMINE 1500 COMPLEX PO) Take by mouth daily.        . Melatonin 3 MG TABS Take by mouth.        . metoprolol tartrate (LOPRESSOR) 25 MG tablet Take one and one-half tablet by mouth twice a day  90 tablet  6  . Multiple Vitamin (MULTIVITAMIN) tablet Take 1 tablet by mouth daily.        . Multiple Vitamins-Minerals (OCUVITE PO) Take by mouth. 2 by mouth daily        . omeprazole (PRILOSEC) 40 MG capsule TAKE ONE CAPSULE BY MOUTH TWICE A DAY  60 capsule  3  . warfarin (COUMADIN) 2.5 MG tablet Take as directed by anticoagulation clinic  50 tablet  3  . Probiotic Product (PROBIOTIC PO) Take by mouth daily.          Allergies  Allergen Reactions  . Penicillins     REACTION: rash  . Sulfonamide Derivatives     REACTION: rash  . Codeine     REACTION: ? hallucinations  . Calcium Carbonate     REACTION: ? Rash with TUMS  . Fentanyl     Chest pain, dyspnea  . Acetaminophen     REACTION: hurts stomach    Past Medical History  Diagnosis Date  . Goiter   . OA (osteoarthritis)   . DJD (degenerative joint disease)   . Macular degeneration   . Depression   . Colon polyp   . Esophageal stricture   . Hyperlipidemia   . H. pylori infection   . GERD (gastroesophageal reflux disease)   . Routine general medical examination at a health care facility   . Nontoxic multinodular goiter   . Hiatal hernia     Past Surgical History  Procedure Date  . Appendectomy   . Tubal ligation     with appendectomy  . Cholecystectomy 1989    . Total abdominal hysterectomy 1972    for pain  . Esophageal dilation     > 3 X; Dr Juanda Chance  . Colonoscopy w/ polypectomy 2005    Neg in 2010    Family History  Problem Relation Age of Onset  . Colon cancer Father   . Osteoarthritis Father   . Coronary artery disease Father   . Heart failure Mother   . Coronary artery disease Mother   . Diabetes Mother   . Breast cancer Sister   . Diabetes Sister   . Stroke Sister   . Heart attack Sister   . Coronary artery disease Brother   . Throat cancer Brother     smoked  . Breast cancer Maternal Aunt     History   Social History  . Marital Status: Married    Spouse Name: N/A    Number of Children: N/A  . Years of Education: N/A   Occupational History  . Not on file.   Social History Main Topics  . Smoking status: Never Smoker   .  Smokeless tobacco: Never Used  . Alcohol Use: No  . Drug Use: No  . Sexually Active: Not on file   Other Topics Concern  . Not on file   Social History Narrative  . No narrative on file    ROS: Please see the HPI.  All other systems reviewed and negative.  PHYSICAL EXAM:  BP 120/78  Pulse 88  Ht 5\' 4"  (1.626 m)  Wt 170 lb (77.111 kg)  BMI 29.18 kg/m2  General: Well developed, well nourished, in no acute distress. Head:  Normocephalic and atraumatic. Neck: no JVD Lungs: Clear to auscultation and percussion. Heart: Normal S1 and S2.  No murmur, rubs or gallops.  Abdomen:  Normal bowel sounds; soft; non tender; no organomegaly Pulses: Pulses normal in all 4 extremities. Extremities: No clubbing or cyanosis. No edema. Neurologic: Alert and oriented x 3.  EKG:  Atrial fib with rapid ventricular response.  Nonspecific ST and T abnormality.   ASSESSMENT AND PLAN:

## 2011-08-09 NOTE — Patient Instructions (Addendum)
Your physician recommends that you schedule a follow-up appointment in: 2 WEEKS  Your physician has recommended you make the following change in your medication: INCREASE Metoprolol to 25mg  take one and one-half tablet by mouth twice a day

## 2011-08-10 NOTE — Assessment & Plan Note (Signed)
Followed by Dr. Alwyn Ren.  She would be primary prevention, and her LDL is near 100 target.

## 2011-08-10 NOTE — Assessment & Plan Note (Addendum)
We will adjust metoprolol dose to get better HR control.  She will also be on anticoagulation.  We might want to consider cardioversion at this point if she is stable.  Hopefully, her rate will come down with more rate controlling therapy.  I also might be inclined to check her echo again soon.  Her nuclear study is as noted in overview.

## 2011-08-11 ENCOUNTER — Encounter: Payer: Self-pay | Admitting: Internal Medicine

## 2011-08-13 ENCOUNTER — Ambulatory Visit (INDEPENDENT_AMBULATORY_CARE_PROVIDER_SITE_OTHER): Payer: Medicare Other | Admitting: *Deleted

## 2011-08-13 DIAGNOSIS — Z7901 Long term (current) use of anticoagulants: Secondary | ICD-10-CM

## 2011-08-13 DIAGNOSIS — I4891 Unspecified atrial fibrillation: Secondary | ICD-10-CM

## 2011-08-13 LAB — POCT INR: INR: 3.2

## 2011-08-20 ENCOUNTER — Ambulatory Visit (INDEPENDENT_AMBULATORY_CARE_PROVIDER_SITE_OTHER): Payer: Medicare Other | Admitting: Cardiology

## 2011-08-20 ENCOUNTER — Encounter: Payer: Self-pay | Admitting: Cardiology

## 2011-08-20 ENCOUNTER — Ambulatory Visit (INDEPENDENT_AMBULATORY_CARE_PROVIDER_SITE_OTHER): Payer: Medicare Other | Admitting: *Deleted

## 2011-08-20 VITALS — BP 120/72 | HR 73 | Resp 18 | Ht 64.0 in | Wt 171.6 lb

## 2011-08-20 DIAGNOSIS — Z7901 Long term (current) use of anticoagulants: Secondary | ICD-10-CM

## 2011-08-20 DIAGNOSIS — I4891 Unspecified atrial fibrillation: Secondary | ICD-10-CM

## 2011-08-20 LAB — POCT INR: INR: 3.4

## 2011-08-20 NOTE — Patient Instructions (Signed)
Your physician has recommended that you have a Cardioversion (DCCV). Electrical Cardioversion uses a jolt of electricity to your heart either through paddles or wired patches attached to your chest. This is a controlled, usually prescheduled, procedure. Defibrillation is done under light anesthesia in the hospital, and you usually go home the day of the procedure. This is done to get your heart back into a normal rhythm. You are not awake for the procedure. Please see the instruction sheet given to you today.  Your physician recommends that you continue on your current medications as directed. Please refer to the Current Medication list given to you today.  Your physician recommends that you schedule a follow-up appointment in: 3 WEEKS

## 2011-08-21 NOTE — Progress Notes (Signed)
HPI:  Bridget Lane comes in for follow up.  Overall, she is doing pretty well.  She denies chest pain.  She definitely feels a difference now when she walks up stairs.  She gets some short of breath.  She denies any major chest pain.  We discussed at length today the pros and cons of attempted cardioversion, including the risks, and expected outcomes.   She would like to give this a try, and she has been on anticoagulation now for a period of time.    Current Outpatient Prescriptions  Medication Sig Dispense Refill  . acetaminophen (TYLENOL) 325 MG tablet Take 650 mg by mouth 2 (two) times daily.        . Calcium Carbonate (CALTRATE 600) 1500 MG TABS Take 1 tablet by mouth daily.        . Glucosamine-Chondroit-Vit C-Mn (GLUCOSAMINE 1500 COMPLEX PO) Take by mouth daily.        Marland Kitchen ibuprofen (ADVIL,MOTRIN) 400 MG tablet Take 400 mg by mouth every 6 (six) hours as needed.        . Melatonin 3 MG TABS Take by mouth.        . metoprolol tartrate (LOPRESSOR) 25 MG tablet Take one and one-half tablet by mouth twice a day  90 tablet  6  . Multiple Vitamin (MULTIVITAMIN) tablet Take 1 tablet by mouth daily.        . Multiple Vitamins-Minerals (OCUVITE PO) Take by mouth. 2 by mouth daily        . omeprazole (PRILOSEC) 40 MG capsule TAKE ONE CAPSULE BY MOUTH TWICE A DAY  60 capsule  3  . Probiotic Product (PROBIOTIC PO) Take by mouth daily.        . sertraline (ZOLOFT) 25 MG tablet Take 25 mg by mouth daily.        Marland Kitchen warfarin (COUMADIN) 2.5 MG tablet Take as directed by anticoagulation clinic  50 tablet  3    Allergies  Allergen Reactions  . Penicillins     REACTION: rash  . Sulfonamide Derivatives     REACTION: rash  . Codeine     REACTION: ? hallucinations  . Calcium Carbonate     REACTION: ? Rash with TUMS  . Fentanyl     Chest pain, dyspnea  . Acetaminophen     REACTION: hurts stomach    Past Medical History  Diagnosis Date  . Goiter   . OA (osteoarthritis)   . DJD (degenerative joint  disease)   . Macular degeneration   . Depression   . Colon polyp   . Esophageal stricture   . Hyperlipidemia   . H. pylori infection   . GERD (gastroesophageal reflux disease)   . Routine general medical examination at a health care facility   . Nontoxic multinodular goiter   . Hiatal hernia     Past Surgical History  Procedure Date  . Appendectomy   . Tubal ligation     with appendectomy  . Cholecystectomy 1989  . Total abdominal hysterectomy 1972    for pain  . Esophageal dilation     > 3 X; Dr Juanda Chance  . Colonoscopy w/ polypectomy 2005    Neg in 2010    Family History  Problem Relation Age of Onset  . Colon cancer Father   . Osteoarthritis Father   . Coronary artery disease Father   . Heart failure Mother   . Coronary artery disease Mother   . Diabetes Mother   . Breast cancer  Sister   . Diabetes Sister   . Stroke Sister   . Heart attack Sister   . Coronary artery disease Brother   . Throat cancer Brother     smoked  . Breast cancer Maternal Aunt     History   Social History  . Marital Status: Married    Spouse Name: N/A    Number of Children: N/A  . Years of Education: N/A   Occupational History  . Not on file.   Social History Main Topics  . Smoking status: Never Smoker   . Smokeless tobacco: Never Used  . Alcohol Use: No  . Drug Use: No  . Sexually Active: Not on file   Other Topics Concern  . Not on file   Social History Narrative  . No narrative on file    ROS: Please see the HPI.  All other systems reviewed and negative.  PHYSICAL EXAM:  BP 120/72  Pulse 73  Resp 18  Ht 5\' 4"  (1.626 m)  Wt 171 lb 9.6 oz (77.837 kg)  BMI 29.45 kg/m2  General: Well developed, well nourished, in no acute distress. Head:  Normocephalic and atraumatic. Neck: no JVD Lungs: Clear to auscultation and percussion. Heart: Normal S1 and S2.  No murmur, rubs or gallops.  Abdomen:  Normal bowel sounds; soft; non tender; no organomegaly Pulses: Pulses  normal in all 4 extremities. Extremities: No clubbing or cyanosis. No edema. Neurologic: Alert and oriented x 3.  EKG:  Atrial fibrillation with controlled ventricular response  ASSESSMENT AND PLAN:

## 2011-08-26 ENCOUNTER — Encounter (HOSPITAL_COMMUNITY): Payer: Self-pay | Admitting: Pharmacy Technician

## 2011-08-27 ENCOUNTER — Ambulatory Visit (INDEPENDENT_AMBULATORY_CARE_PROVIDER_SITE_OTHER): Payer: Medicare Other | Admitting: *Deleted

## 2011-08-27 ENCOUNTER — Other Ambulatory Visit (INDEPENDENT_AMBULATORY_CARE_PROVIDER_SITE_OTHER): Payer: Medicare Other | Admitting: *Deleted

## 2011-08-27 DIAGNOSIS — I4891 Unspecified atrial fibrillation: Secondary | ICD-10-CM

## 2011-08-27 DIAGNOSIS — Z7901 Long term (current) use of anticoagulants: Secondary | ICD-10-CM

## 2011-08-28 LAB — CBC WITH DIFFERENTIAL/PLATELET
Eosinophils Relative: 0.9 % (ref 0.0–5.0)
Lymphocytes Relative: 33.1 % (ref 12.0–46.0)
MCV: 87.9 fl (ref 78.0–100.0)
Monocytes Absolute: 0.5 10*3/uL (ref 0.1–1.0)
Neutrophils Relative %: 58.8 % (ref 43.0–77.0)
Platelets: 288 10*3/uL (ref 150.0–400.0)
WBC: 8.4 10*3/uL (ref 4.5–10.5)

## 2011-08-28 LAB — BASIC METABOLIC PANEL
BUN: 17 mg/dL (ref 6–23)
Calcium: 9.1 mg/dL (ref 8.4–10.5)
Chloride: 106 mEq/L (ref 96–112)
Creatinine, Ser: 0.9 mg/dL (ref 0.4–1.2)
GFR: 63.83 mL/min (ref >60.00)

## 2011-08-28 LAB — APTT: aPTT: 42.7 s — ABNORMAL HIGH (ref 21.7–28.8)

## 2011-08-29 ENCOUNTER — Encounter (HOSPITAL_COMMUNITY): Payer: Self-pay | Admitting: Anesthesiology

## 2011-08-29 ENCOUNTER — Ambulatory Visit (HOSPITAL_COMMUNITY): Payer: Medicare Other | Admitting: Anesthesiology

## 2011-08-29 ENCOUNTER — Encounter (HOSPITAL_COMMUNITY)
Admission: RE | Disposition: A | Discharge status: Home / Self Care | Payer: Self-pay | Source: Ambulatory Visit | Attending: Cardiology

## 2011-08-29 ENCOUNTER — Ambulatory Visit (HOSPITAL_COMMUNITY)
Admission: RE | Admit: 2011-08-29 | Discharge: 2011-08-29 | Disposition: A | Discharge status: Home / Self Care | Payer: Medicare Other | Source: Ambulatory Visit | Attending: Cardiology | Admitting: Cardiology

## 2011-08-29 ENCOUNTER — Encounter (HOSPITAL_COMMUNITY): Payer: Self-pay | Admitting: Cardiology

## 2011-08-29 ENCOUNTER — Other Ambulatory Visit: Payer: Self-pay

## 2011-08-29 DIAGNOSIS — I4891 Unspecified atrial fibrillation: Secondary | ICD-10-CM

## 2011-08-29 DIAGNOSIS — K219 Gastro-esophageal reflux disease without esophagitis: Secondary | ICD-10-CM | POA: Insufficient documentation

## 2011-08-29 DIAGNOSIS — I10 Essential (primary) hypertension: Secondary | ICD-10-CM | POA: Insufficient documentation

## 2011-08-29 HISTORY — PX: CARDIOVERSION: SHX1299

## 2011-08-29 SURGERY — CARDIOVERSION
Anesthesia: Moderate Sedation | Wound class: Clean

## 2011-08-29 MED ORDER — HYDROCORTISONE 1 % EX CREA
1.0000 "application " | TOPICAL_CREAM | Freq: Three times a day (TID) | CUTANEOUS | Status: DC | PRN
  Filled 2011-08-29: qty 28

## 2011-08-29 MED ORDER — SODIUM CHLORIDE 0.9 % IV SOLN
INTRAVENOUS | Status: DC | PRN
  Administered 2011-08-29: 14:00:00 via INTRAVENOUS

## 2011-08-29 MED ORDER — PROPOFOL 10 MG/ML IV EMUL
INTRAVENOUS | Status: DC | PRN
  Administered 2011-08-29: 40 mg via INTRAVENOUS

## 2011-08-29 MED ORDER — SODIUM CHLORIDE 0.9 % IV SOLN: INTRAVENOUS | Status: DC

## 2011-08-29 NOTE — Transfer of Care (Signed)
Immediate Anesthesia Transfer of Care Note  Patient: Bridget Lane  Procedure(s) Performed:  CARDIOVERSION  Patient Location: PACU and Short Stay  Anesthesia Type: MAC  Level of Consciousness: awake, alert , oriented, patient cooperative and responds to stimulation  Airway & Oxygen Therapy: Patient Spontanous Breathing and Patient connected to nasal cannula oxygen  Post-op Assessment: Report given to PACU RN, Patient moving all extremities and Patient able to stick tongue midline  Post vital signs: Reviewed and stable  Complications: No apparent anesthesia complications

## 2011-08-29 NOTE — Procedures (Signed)
The patient was assessed prior to the procedure and was alert and oriented.  AP PADS were placed with the Zoll device. Anesthesia was provided by the service.  I spoke with the patient and her husband prior to the procedure, and reviewed the risks and benefits.  She received 40mg  of Diprivan by Diamantina Monks, MD, with excellent anesthesia.  One shock with 75 joules returned her to sinus rhythm on the first attempt.  No complications were noted.  She was alert post procedure, and her husband came in to see her.  Her EKG demonstrated normal sinus rhythm with slightly prolonged QTc, rate of 63 bpm.   She will be sent home for follow up in two weeks in our office.    Bonnee Quin, MD, [facility]

## 2011-08-29 NOTE — Anesthesia Postprocedure Evaluation (Signed)
  Anesthesia Post-op Note  Patient: Bridget Lane  Procedure(s) Performed:  CARDIOVERSION  Patient Location: PACU  Anesthesia Type: General  Level of Consciousness: awake, alert  and oriented  Airway and Oxygen Therapy: Patient Spontanous Breathing and Patient connected to nasal cannula oxygen  Post-op Pain: none  Post-op Assessment: Post-op Vital signs reviewed, Patient's Cardiovascular Status Stable, Respiratory Function Stable, Patent Airway and No signs of Nausea or vomiting  Post-op Vital Signs: stable  Complications: No apparent anesthesia complications

## 2011-08-29 NOTE — Assessment & Plan Note (Signed)
The patients workup is as noted in the overview section of this document.  Notably, her LV systolic function is normal, with the RV perhaps slightly enlarged.  No ASD or PFO was noted.  She remains in atrial fibrillation, and is an acceptable candidate for treatment with cardioversion.  The risks, benefits, and alternatives have been discussed and she consents to proceed.

## 2011-08-29 NOTE — Anesthesia Preprocedure Evaluation (Addendum)
Anesthesia Evaluation  Patient identified by MRN, date of birth, ID band Patient awake    Reviewed: Allergy & Precautions, H&P , NPO status , Patient's Chart, lab work & pertinent test results, reviewed documented beta blocker date and time   Airway Mallampati: I TM Distance: >3 FB Neck ROM: full    Dental  (+) Teeth Intact   Pulmonary    Pulmonary exam normal       Cardiovascular hypertension, Pt. on medications and Pt. on home beta blockers Atrial Fibrillation irregular Normal    Neuro/Psych  Neuromuscular disease    GI/Hepatic hiatal hernia, GERD-  Controlled and Medicated,  Endo/Other    Renal/GU      Musculoskeletal  (+) Arthritis -, Osteoarthritis,    Abdominal   Peds  Hematology   Anesthesia Other Findings   Reproductive/Obstetrics                          Anesthesia Physical Anesthesia Plan  ASA: III  Anesthesia Plan: General   Post-op Pain Management:    Induction: Intravenous  Airway Management Planned: Mask  Additional Equipment:   Intra-op Plan:   Post-operative Plan:   Informed Consent: I have reviewed the patients History and Physical, chart, labs and discussed the procedure including the risks, benefits and alternatives for the proposed anesthesia with the patient or authorized representative who has indicated his/her understanding and acceptance.     Plan Discussed with: Anesthesiologist, CRNA and Surgeon  Anesthesia Plan Comments:         Anesthesia Quick Evaluation

## 2011-08-29 NOTE — Preoperative (Signed)
Beta Blockers   Reason not to administer Beta Blockers:Metoprolol  0900 this morning

## 2011-09-03 ENCOUNTER — Ambulatory Visit (INDEPENDENT_AMBULATORY_CARE_PROVIDER_SITE_OTHER): Payer: Medicare Other | Admitting: *Deleted

## 2011-09-03 DIAGNOSIS — I4891 Unspecified atrial fibrillation: Secondary | ICD-10-CM

## 2011-09-03 DIAGNOSIS — Z7901 Long term (current) use of anticoagulants: Secondary | ICD-10-CM

## 2011-09-03 LAB — POCT INR: INR: 2.5

## 2011-09-04 ENCOUNTER — Encounter (HOSPITAL_COMMUNITY): Payer: Self-pay | Admitting: Cardiology

## 2011-09-24 ENCOUNTER — Encounter: Payer: Medicare Other | Admitting: *Deleted

## 2011-09-24 ENCOUNTER — Ambulatory Visit (INDEPENDENT_AMBULATORY_CARE_PROVIDER_SITE_OTHER): Payer: Medicare Other | Admitting: Cardiology

## 2011-09-24 ENCOUNTER — Encounter: Payer: Self-pay | Admitting: Cardiology

## 2011-09-24 ENCOUNTER — Ambulatory Visit (INDEPENDENT_AMBULATORY_CARE_PROVIDER_SITE_OTHER): Payer: Medicare Other | Admitting: *Deleted

## 2011-09-24 VITALS — BP 110/68 | HR 78 | Ht 64.5 in | Wt 173.0 lb

## 2011-09-24 DIAGNOSIS — I4891 Unspecified atrial fibrillation: Secondary | ICD-10-CM

## 2011-09-24 DIAGNOSIS — E785 Hyperlipidemia, unspecified: Secondary | ICD-10-CM

## 2011-09-24 DIAGNOSIS — Z7901 Long term (current) use of anticoagulants: Secondary | ICD-10-CM

## 2011-09-24 LAB — POCT INR: INR: 3.4

## 2011-09-24 NOTE — Patient Instructions (Signed)
You have been referred to Dr Johney Frame for AFib failed Cardioversion.  Your physician recommends that you continue on your current medications as directed. Please refer to the Current Medication list given to you today.  Your physician recommends that you schedule a follow-up appointment in: 2 MONTHS

## 2011-09-24 NOTE — Progress Notes (Signed)
HPI:  Bridget Lane felt great for the three days after cardioversion, and was very active.  She felt energetic.  But now she feels like she is falling apart.  She feels pain in her R leg down to her foot and heal, has stiffness in shoulder and left neck.  She thinks she was in normal rhythm for about a week, then went out.  She experiences no chest pain.  Not clear that her exercise tolerance is declining.    Current Outpatient Prescriptions  Medication Sig Dispense Refill  . acetaminophen (TYLENOL) 325 MG tablet Take 650 mg by mouth 2 (two) times daily.       . Calcium Carbonate (CALTRATE 600) 1500 MG TABS Take 1 tablet by mouth daily.        . Glucosamine-Chondroit-Vit C-Mn (GLUCOSAMINE 1500 COMPLEX PO) Take by mouth daily.        . Melatonin 3 MG TABS Take 3 mg by mouth at bedtime.       . metoprolol tartrate (LOPRESSOR) 25 MG tablet Take one and one-half tablet by mouth twice a day  90 tablet  6  . Multiple Vitamin (MULTIVITAMIN) tablet Take 1 tablet by mouth daily.        . Multiple Vitamins-Minerals (OCUVITE PO) Take by mouth. 2 by mouth daily        . omeprazole (PRILOSEC) 40 MG capsule TAKE ONE CAPSULE BY MOUTH TWICE A DAY  60 capsule  3  . Probiotic Product (PROBIOTIC PO) Take by mouth daily.        . sertraline (ZOLOFT) 25 MG tablet Take 25 mg by mouth daily.        Marland Kitchen warfarin (COUMADIN) 2.5 MG tablet Take 2.5-5 mg by mouth daily. Take 2 tablets on Wednesday & Saturday Take 1 tablet all other days         Allergies  Allergen Reactions  . Penicillins     REACTION: rash  . Sulfonamide Derivatives     REACTION: rash  . Codeine     REACTION: ? hallucinations  . Fentanyl     Chest pain, dyspnea ("she almost died")  . Acetaminophen     REACTION: hurts stomach    Past Medical History  Diagnosis Date  . Goiter   . OA (osteoarthritis)   . DJD (degenerative joint disease)   . Macular degeneration   . Depression   . Colon polyp   . Esophageal stricture   . Hyperlipidemia     . H. pylori infection   . GERD (gastroesophageal reflux disease)   . Routine general medical examination at a health care facility   . Nontoxic multinodular goiter   . Hiatal hernia     Past Surgical History  Procedure Date  . Appendectomy   . Tubal ligation     with appendectomy  . Cholecystectomy 1989  . Total abdominal hysterectomy 1972    for pain  . Esophageal dilation     > 3 X; Dr Juanda Chance  . Colonoscopy w/ polypectomy 2005    Neg in 2010  . Electrical cardioversion 08/29/2011       . Cardioversion 08/29/2011    Procedure: CARDIOVERSION;  Surgeon: Shawnie Pons, MD;  Location: [facility] OR;  Service: Cardiovascular;  Laterality: N/A;    Family History  Problem Relation Age of Onset  . Colon cancer Father   . Osteoarthritis Father   . Coronary artery disease Father   . Heart failure Mother   . Coronary artery disease Mother   .  Diabetes Mother   . Breast cancer Sister   . Diabetes Sister   . Stroke Sister   . Heart attack Sister   . Coronary artery disease Brother   . Throat cancer Brother     smoked  . Breast cancer Maternal Aunt     History   Social History  . Marital Status: Married    Spouse Name: N/A    Number of Children: N/A  . Years of Education: N/A   Occupational History  . Not on file.   Social History Main Topics  . Smoking status: Never Smoker   . Smokeless tobacco: Never Used  . Alcohol Use: No  . Drug Use: No  . Sexually Active: Not on file   Other Topics Concern  . Not on file   Social History Narrative  . No narrative on file    ROS: Please see the HPI.  All other systems reviewed and negative.  PHYSICAL EXAM:  BP 110/68  Pulse 78  Ht 5' 4.5" (1.638 m)  Wt 78.472 kg (173 lb)  BMI 29.24 kg/m2  General: Well developed, well nourished, in no acute distress. Head:  Normocephalic and atraumatic. Neck: no JVD Lungs: Clear to auscultation and percussion. Heart: Irregularly irregular  Abdomen:  Normal bowel sounds; soft; non  tender; no organomegaly Pulses: Pulses normal in all 4 extremities. Extremities: No clubbing or cyanosis. No edema. Neurologic: Alert and oriented x 3.  EKG:  Atrial fibrillation with controlled ventricular response.   ASSESSMENT AND PLAN:

## 2011-09-24 NOTE — Progress Notes (Signed)
Patient ID: Bridget Lane, female   DOB: 28-Oct-1932, 77 y.o.   MRN: [id number]

## 2011-09-24 NOTE — Assessment & Plan Note (Addendum)
She has recurrent atrial fib.  She will likely need pharmacologic therapy for her atrial fibrillation.  She could be on a number of agents, possibly even Flecainide.  She is in good health, is active, and might eventually be a candidate for ablation.  Will have her see Dr. Johney Frame and get his advice regarding alternatives at this point in time.  Will continue warfarin at the present time.  She might also be a candidate for anti-thrombin treatment.

## 2011-09-24 NOTE — Assessment & Plan Note (Signed)
Does not meet criteria for primary prevention.

## 2011-10-07 ENCOUNTER — Encounter: Payer: Self-pay | Admitting: Family

## 2011-10-07 ENCOUNTER — Ambulatory Visit (INDEPENDENT_AMBULATORY_CARE_PROVIDER_SITE_OTHER): Payer: Medicare Other | Admitting: Family

## 2011-10-07 DIAGNOSIS — J069 Acute upper respiratory infection, unspecified: Secondary | ICD-10-CM | POA: Insufficient documentation

## 2011-10-07 MED ORDER — BENZONATATE 100 MG PO CAPS: 100.0000 mg | ORAL_CAPSULE | Freq: Three times a day (TID) | ORAL | Status: AC | PRN

## 2011-10-07 NOTE — Assessment & Plan Note (Signed)
Will plan to treat conservatively for now.  Consider abx if symptoms worsen or if no improvement in 2-3 days.

## 2011-10-07 NOTE — Patient Instructions (Addendum)
You may use claritin 10mg  once daily as needed or mucinex 600mg  twice daily as needed for congestion. Call if you develop fever >100, if symptoms worsen, or if you are not feeling better in 2-3 days.

## 2011-10-07 NOTE — Progress Notes (Signed)
Subjective:    Patient ID: Bridget Lane, female    DOB: 03-May-1933, 78 y.o.   MRN: [id number]  HPI  Bridget Lane is a 77 yr old female who presents today with chief complaint of cough and sore throat. Symptoms started 3-4 days ago.   Symptoms associated with nasal congestion. Denies associated HA or fever.   Symptoms improved by an OTC cough medication.   Course is unchanged.     Review of Systems See HPI  Past Medical History  Diagnosis Date  . Goiter   . OA (osteoarthritis)   . DJD (degenerative joint disease)   . Macular degeneration   . Depression   . Colon polyp   . Esophageal stricture   . Hyperlipidemia   . H. pylori infection   . GERD (gastroesophageal reflux disease)   . Routine general medical examination at a health care facility   . Nontoxic multinodular goiter   . Hiatal hernia     History   Social History  . Marital Status: Married    Spouse Name: N/A    Number of Children: N/A  . Years of Education: N/A   Occupational History  . Not on file.   Social History Main Topics  . Smoking status: Never Smoker   . Smokeless tobacco: Never Used  . Alcohol Use: No  . Drug Use: No  . Sexually Active: Not on file   Other Topics Concern  . Not on file   Social History Narrative  . No narrative on file    Past Surgical History  Procedure Date  . Appendectomy   . Tubal ligation     with appendectomy  . Cholecystectomy 1989  . Total abdominal hysterectomy 1972    for pain  . Esophageal dilation     > 3 X; Dr Juanda Chance  . Colonoscopy w/ polypectomy 2005    Neg in 2010  . Electrical cardioversion 08/29/2011       . Cardioversion 08/29/2011    Procedure: CARDIOVERSION;  Surgeon: Shawnie Pons, MD;  Location: [facility] OR;  Service: Cardiovascular;  Laterality: N/A;    Family History  Problem Relation Age of Onset  . Colon cancer Father   . Osteoarthritis Father   . Coronary artery disease Father   . Heart failure Mother   . Coronary artery disease  Mother   . Diabetes Mother   . Breast cancer Sister   . Diabetes Sister   . Stroke Sister   . Heart attack Sister   . Coronary artery disease Brother   . Throat cancer Brother     smoked  . Breast cancer Maternal Aunt     Allergies  Allergen Reactions  . Penicillins     REACTION: rash  . Sulfonamide Derivatives     REACTION: rash  . Codeine     REACTION: ? hallucinations  . Fentanyl     Chest pain, dyspnea ("she almost died")  . Acetaminophen     REACTION: hurts stomach    Current Outpatient Prescriptions on File Prior to Visit  Medication Sig Dispense Refill  . acetaminophen (TYLENOL) 325 MG tablet Take 650 mg by mouth 2 (two) times daily.       . Calcium Carbonate (CALTRATE 600) 1500 MG TABS Take 1 tablet by mouth daily.        . Glucosamine-Chondroit-Vit C-Mn (GLUCOSAMINE 1500 COMPLEX PO) Take by mouth daily.        . Melatonin 3 MG TABS Take 3 mg by  mouth at bedtime.       . metoprolol tartrate (LOPRESSOR) 25 MG tablet Take one and one-half tablet by mouth twice a day  90 tablet  6  . Multiple Vitamins-Minerals (OCUVITE PO) Take by mouth. 2 by mouth daily        . omeprazole (PRILOSEC) 40 MG capsule TAKE ONE CAPSULE BY MOUTH TWICE A DAY  60 capsule  3  . Probiotic Product (PROBIOTIC PO) Take by mouth daily.        . sertraline (ZOLOFT) 25 MG tablet Take 25 mg by mouth daily.        Marland Kitchen warfarin (COUMADIN) 2.5 MG tablet Take 2.5-5 mg by mouth daily. Take 2 tablets on Wednesday & Saturday Take 1 tablet all other days         BP 124/68  Pulse 78  Temp(Src) 98.1 F (36.7 C) (Oral)  Resp 16  Wt 172 lb (78.019 kg)       Objective:   Physical Exam  Constitutional: She is oriented to person, place, and time. She appears well-developed and well-nourished.  HENT:  Head: Normocephalic and atraumatic.  Eyes: No scleral icterus.  Neck: Normal range of motion. Neck supple.  Cardiovascular: Normal rate.  An irregular rhythm present.  Pulmonary/Chest: Effort normal and  breath sounds normal. No respiratory distress. She has no wheezes. She has no rales. She exhibits no tenderness.  Musculoskeletal: She exhibits no edema.  Lymphadenopathy:    She has no cervical adenopathy.  Neurological: She is alert and oriented to person, place, and time. No cranial nerve deficit.  Skin: Skin is warm and dry. No erythema.  Psychiatric: She has a normal mood and affect. Her behavior is normal. Judgment and thought content normal.          Assessment & Plan:

## 2011-10-08 ENCOUNTER — Telehealth: Payer: Self-pay | Admitting: *Deleted

## 2011-10-08 MED ORDER — AZITHROMYCIN 250 MG PO TABS: ORAL_TABLET | ORAL | Status: AC

## 2011-10-08 NOTE — Telephone Encounter (Signed)
Pt.notified

## 2011-10-08 NOTE — Telephone Encounter (Signed)
RX has been sent to pharmacy.  I have sent notification to Weston Brass in [facility] re: addition of antibiotics.

## 2011-10-08 NOTE — Telephone Encounter (Signed)
Pt called stating she was worse through the night. Coughed all night, throat feels worse and feels like she has a low grade temp. Pt is requesting that we [facility] in [facility] [place]. Please advise.

## 2011-10-17 ENCOUNTER — Ambulatory Visit (INDEPENDENT_AMBULATORY_CARE_PROVIDER_SITE_OTHER): Payer: Medicare Other | Admitting: *Deleted

## 2011-10-17 DIAGNOSIS — I4891 Unspecified atrial fibrillation: Secondary | ICD-10-CM

## 2011-10-17 DIAGNOSIS — Z7901 Long term (current) use of anticoagulants: Secondary | ICD-10-CM

## 2011-10-17 LAB — POCT INR: INR: 4.6

## 2011-10-31 ENCOUNTER — Ambulatory Visit (INDEPENDENT_AMBULATORY_CARE_PROVIDER_SITE_OTHER): Payer: Medicare Other | Admitting: *Deleted

## 2011-10-31 DIAGNOSIS — I4891 Unspecified atrial fibrillation: Secondary | ICD-10-CM

## 2011-10-31 DIAGNOSIS — Z7901 Long term (current) use of anticoagulants: Secondary | ICD-10-CM

## 2011-10-31 LAB — POCT INR: INR: 2.6

## 2011-11-03 ENCOUNTER — Other Ambulatory Visit: Payer: Self-pay | Admitting: Internal Medicine

## 2011-11-14 ENCOUNTER — Ambulatory Visit (INDEPENDENT_AMBULATORY_CARE_PROVIDER_SITE_OTHER): Payer: Medicare Other | Admitting: Internal Medicine

## 2011-11-14 ENCOUNTER — Encounter: Payer: Self-pay | Admitting: Internal Medicine

## 2011-11-14 VITALS — BP 106/68 | HR 85 | Ht 64.0 in | Wt 174.0 lb

## 2011-11-14 DIAGNOSIS — I4891 Unspecified atrial fibrillation: Secondary | ICD-10-CM

## 2011-11-14 MED ORDER — FLECAINIDE ACETATE 100 MG PO TABS: 100.0000 mg | ORAL_TABLET | Freq: Two times a day (BID) | ORAL | Status: DC

## 2011-11-14 NOTE — Progress Notes (Signed)
Primary Care Physician: Marga Melnick, MD, MD Referring Physician:  Dr Berneice Gandy Bridget Lane is a 78 y.o. female with a h/o persistent atrial fibrillation who presents today for EP consultation.  She reports initially being diagnosed with atrial fibrillation 7/12 after presenting with chest pain.  She was found to have afib with RVR.  IN retrospect, she thinks that she has had afib since late May or June 2012 due to significant decrease in exercise tolerance.  She was initially rate controlled with metoprolol.  She subsequently was placed on coumadin.  She underwent cardioversion 08/29/11.  She reports immediate improvement in symptoms of palpitations.  She felt "like she could run a marathon".  Unfortunately, she returned to afib within 2-3 days and her exercise tolerance again declined.  She has been in persistent afib since that time.  Today, she denies symptoms of chest pain, shortness of breath, orthopnea, PND, lower extremity edema, dizziness, presyncope, syncope, or neurologic sequela. The patient is tolerating medications without difficulties and is otherwise without complaint today.   Past Medical History  Diagnosis Date  . OA (osteoarthritis)   . DJD (degenerative joint disease)   . Macular degeneration   . Depression   . Colon polyp   . Esophageal stricture   . Hyperlipidemia   . H. pylori infection   . GERD (gastroesophageal reflux disease)   . Nontoxic multinodular goiter   . Hiatal hernia   . Atrial fibrillation     persistent   Past Surgical History  Procedure Date  . Appendectomy   . Tubal ligation     with appendectomy  . Cholecystectomy 1989  . Total abdominal hysterectomy 1972    for pain  . Esophageal dilation     > 3 X; Dr Juanda Chance  . Colonoscopy w/ polypectomy 2005    Neg in 2010  . Cardioversion 08/29/2011    Procedure: CARDIOVERSION;  Surgeon: Shawnie Pons, MD;  Location: [facility] OR;  Service: Cardiovascular;  Laterality: N/A;    Current Outpatient  Prescriptions  Medication Sig Dispense Refill  . acetaminophen (TYLENOL) 325 MG tablet Take 650 mg by mouth 2 (two) times daily.       . Calcium Carbonate (CALTRATE 600) 1500 MG TABS Take 1 tablet by mouth daily.        . Glucosamine-Chondroit-Vit C-Mn (GLUCOSAMINE 1500 COMPLEX PO) Take by mouth daily.        . Melatonin 3 MG TABS Take 3 mg by mouth at bedtime.       . metoprolol tartrate (LOPRESSOR) 25 MG tablet Take one and one-half tablet by mouth twice a day  90 tablet  6  . Multiple Vitamins-Minerals (OCUVITE PO) Take by mouth. 2 by mouth daily        . omeprazole (PRILOSEC) 40 MG capsule TAKE ONE CAPSULE BY MOUTH TWICE A DAY  60 capsule  3  . Probiotic Product (PROBIOTIC PO) Take by mouth daily.        . sertraline (ZOLOFT) 25 MG tablet Take 25 mg by mouth daily.        Marland Kitchen warfarin (COUMADIN) 2.5 MG tablet Take 2.5-5 mg by mouth as directed.         Allergies  Allergen Reactions  . Penicillins     REACTION: rash  . Sulfonamide Derivatives     REACTION: rash  . Codeine     REACTION: ? hallucinations  . Fentanyl     Chest pain, dyspnea ("she almost died")  . Acetaminophen  REACTION: hurts stomach    History   Social History  . Marital Status: Married    Spouse Name: N/A    Number of Children: N/A  . Years of Education: N/A   Occupational History  . Not on file.   Social History Main Topics  . Smoking status: Never Smoker   . Smokeless tobacco: Never Used  . Alcohol Use: No  . Drug Use: No  . Sexually Active: Not on file   Other Topics Concern  . Not on file   Social History Narrative   Pt lives in [place] with spouse.  Retired from [employer].    Family History  Problem Relation Age of Onset  . Colon cancer Father   . Osteoarthritis Father   . Coronary artery disease Father   . Heart failure Mother   . Coronary artery disease Mother   . Diabetes Mother   . Breast cancer Sister   . Diabetes Sister   . Stroke Sister   . Heart  attack Sister   . Coronary artery disease Brother   . Throat cancer Brother     smoked  . Breast cancer Maternal Aunt     ROS- All systems are reviewed and negative except as per the HPI above  Physical Exam: Filed Vitals:   11/14/11 1439  BP: 106/68  Pulse: 85  Height: 5\' 4"  (1.626 m)  Weight: 174 lb (78.926 kg)    GEN- The patient is well appearing, alert and oriented x 3 today.   Head- normocephalic, atraumatic Eyes-  Sclera clear, conjunctiva pink Ears- hearing intact Oropharynx- clear Neck- supple, no JVP Lymph- no cervical lymphadenopathy Lungs- Clear to ausculation bilaterally, normal work of breathing Heart- irregular rate and rhythm, no murmurs, rubs or gallops, PMI not laterally displaced GI- soft, NT, ND, + BS Extremities- no clubbing, cyanosis, or edema MS- no significant deformity or atrophy Skin- no rash or lesion Psych- euthymic mood, full affect Neuro- strength and sensation are intact  EKG-  Afib, V rate 85 06/26/11- normal myoview 7/12- echo reveals LA size 32 mm, normal LV function, no significant valvular disease   Assessment and Plan:

## 2011-11-14 NOTE — Assessment & Plan Note (Signed)
Bridget Lane clearly has symptomatic persistent atrial fibrillation.  She is adequately anticoagulated with coumadin but has not yet tried an AAD.  I will start flecainide 100mg  BID today.  She will return for EKG next week.  IF she is in afib at that time, then I would anticipate cardioversion next week. She is scheduled to see Dr Riley Kill in 2-3 weeks.  I will see her again in 6 weeks.  If she is still in sinus when I see her, then we will perform GXT.  If she remains in afib at that time, then our options are tikosyn or catheter ablation.   I think that she would be a good candidate for catheter ablation if she failed medical attempts at rhythm control. She snores.  We will refer for sleep study to exclude sleep apnea.

## 2011-11-14 NOTE — Patient Instructions (Signed)
Start Flecainide 100mg  1 tablet twice daily ([facility] [place])  Come Monday or Tuesday for an EKG with the nurse (we will schedule appt)  Follow up with Dr Johney Frame in 6 weeks.

## 2011-11-18 ENCOUNTER — Ambulatory Visit (INDEPENDENT_AMBULATORY_CARE_PROVIDER_SITE_OTHER): Payer: Medicare Other

## 2011-11-18 VITALS — BP 112/70 | HR 76 | Resp 18 | Wt 174.0 lb

## 2011-11-18 DIAGNOSIS — I4891 Unspecified atrial fibrillation: Secondary | ICD-10-CM

## 2011-11-19 NOTE — Progress Notes (Signed)
Addended by: Judithe Modest D on: 11/19/2011 03:05 PM   Modules accepted: Orders

## 2011-11-20 ENCOUNTER — Encounter (HOSPITAL_COMMUNITY): Payer: Self-pay | Admitting: Pharmacy Technician

## 2011-11-21 ENCOUNTER — Other Ambulatory Visit: Payer: Self-pay | Admitting: *Deleted

## 2011-11-21 DIAGNOSIS — I4891 Unspecified atrial fibrillation: Secondary | ICD-10-CM

## 2011-11-21 MED ORDER — SODIUM CHLORIDE 0.9 % IV SOLN: INTRAVENOUS | Status: DC

## 2011-11-22 ENCOUNTER — Encounter (HOSPITAL_COMMUNITY)
Admission: RE | Disposition: A | Discharge status: Home / Self Care | Payer: Self-pay | Source: Ambulatory Visit | Attending: Internal Medicine

## 2011-11-22 ENCOUNTER — Ambulatory Visit (HOSPITAL_COMMUNITY): Payer: Medicare Other | Admitting: *Deleted

## 2011-11-22 ENCOUNTER — Encounter (HOSPITAL_COMMUNITY): Payer: Self-pay | Admitting: *Deleted

## 2011-11-22 ENCOUNTER — Other Ambulatory Visit: Payer: Self-pay

## 2011-11-22 ENCOUNTER — Ambulatory Visit (HOSPITAL_COMMUNITY)
Admission: RE | Admit: 2011-11-22 | Discharge: 2011-11-22 | Disposition: A | Discharge status: Home / Self Care | Payer: Medicare Other | Source: Ambulatory Visit | Attending: Internal Medicine | Admitting: Internal Medicine

## 2011-11-22 DIAGNOSIS — F329 Major depressive disorder, single episode, unspecified: Secondary | ICD-10-CM | POA: Insufficient documentation

## 2011-11-22 DIAGNOSIS — I4891 Unspecified atrial fibrillation: Secondary | ICD-10-CM | POA: Insufficient documentation

## 2011-11-22 DIAGNOSIS — K219 Gastro-esophageal reflux disease without esophagitis: Secondary | ICD-10-CM | POA: Insufficient documentation

## 2011-11-22 DIAGNOSIS — M129 Arthropathy, unspecified: Secondary | ICD-10-CM | POA: Insufficient documentation

## 2011-11-22 DIAGNOSIS — Z7901 Long term (current) use of anticoagulants: Secondary | ICD-10-CM | POA: Insufficient documentation

## 2011-11-22 DIAGNOSIS — F3289 Other specified depressive episodes: Secondary | ICD-10-CM | POA: Insufficient documentation

## 2011-11-22 DIAGNOSIS — K449 Diaphragmatic hernia without obstruction or gangrene: Secondary | ICD-10-CM | POA: Insufficient documentation

## 2011-11-22 HISTORY — PX: CARDIOVERSION: SHX1299

## 2011-11-22 LAB — COMPREHENSIVE METABOLIC PANEL
Alkaline Phosphatase: 69 U/L (ref 39–117)
BUN: 22 mg/dL (ref 6–23)
Calcium: 9.6 mg/dL (ref 8.4–10.5)
Creatinine, Ser: 0.92 mg/dL (ref 0.50–1.10)
GFR calc Af Amer: 68 mL/min — ABNORMAL LOW (ref >90)
Glucose, Bld: 93 mg/dL (ref 70–99)
Total Protein: 6.9 g/dL (ref 6.0–8.3)

## 2011-11-22 LAB — CBC
MCV: 86.3 fL (ref 78.0–100.0)
Platelets: 249 10*3/uL (ref 150–400)
RDW: 13.8 % (ref 11.5–15.5)
WBC: 7.9 10*3/uL (ref 4.0–10.5)

## 2011-11-22 LAB — PROTIME-INR: INR: 2.36 — ABNORMAL HIGH (ref 0.00–1.49)

## 2011-11-22 LAB — MAGNESIUM: Magnesium: 1.9 mg/dL (ref 1.5–2.5)

## 2011-11-22 SURGERY — CARDIOVERSION
Anesthesia: General | Wound class: Clean

## 2011-11-22 MED ORDER — SODIUM CHLORIDE 0.9 % IV SOLN: 250.0000 mL | INTRAVENOUS | Status: DC

## 2011-11-22 MED ORDER — SODIUM CHLORIDE 0.9 % IV SOLN
INTRAVENOUS | Status: DC | PRN
  Administered 2011-11-22: 14:00:00 via INTRAVENOUS

## 2011-11-22 MED ORDER — SODIUM CHLORIDE 0.9 % IJ SOLN: 3.0000 mL | INTRAMUSCULAR | Status: DC | PRN

## 2011-11-22 MED ORDER — HYDROCORTISONE 1 % EX CREA
1.0000 "application " | TOPICAL_CREAM | Freq: Three times a day (TID) | CUTANEOUS | Status: DC | PRN
  Filled 2011-11-22: qty 28

## 2011-11-22 MED ORDER — SODIUM CHLORIDE 0.9 % IJ SOLN: 3.0000 mL | Freq: Two times a day (BID) | INTRAMUSCULAR | Status: DC

## 2011-11-22 MED ORDER — PROPOFOL 10 MG/ML IV BOLUS
INTRAVENOUS | Status: DC | PRN
  Administered 2011-11-22: 70 mg via INTRAVENOUS

## 2011-11-22 NOTE — Anesthesia Preprocedure Evaluation (Addendum)
Anesthesia Evaluation  Patient identified by MRN, date of birth, ID band Patient awake    Reviewed: Allergy & Precautions, H&P , NPO status , Patient's Chart, lab work & pertinent test results  History of Anesthesia Complications Negative for: history of anesthetic complications  Airway Mallampati: II TM Distance: >3 FB Neck ROM: Full    Dental  (+) Dental Advisory Given and Teeth Intact   Pulmonary neg pulmonary ROS,  clear to auscultation        Cardiovascular + dysrhythmias Atrial Fibrillation Irregular Normal    Neuro/Psych Depression    GI/Hepatic Neg liver ROS, hiatal hernia, GERD-  Controlled,  Endo/Other  Negative Endocrine ROS  Renal/GU negative Renal ROS  Genitourinary negative   Musculoskeletal  (+) Arthritis -,   Abdominal   Peds  Hematology   Anesthesia Other Findings   Reproductive/Obstetrics negative OB ROS                          Anesthesia Physical Anesthesia Plan  ASA: III  Anesthesia Plan: General   Post-op Pain Management:    Induction: Intravenous  Airway Management Planned: Mask  Additional Equipment:   Intra-op Plan:   Post-operative Plan:   Informed Consent: I have reviewed the patients History and Physical, chart, labs and discussed the procedure including the risks, benefits and alternatives for the proposed anesthesia with the patient or authorized representative who has indicated his/her understanding and acceptance.   Dental advisory given  Plan Discussed with: CRNA, Anesthesiologist and Surgeon  Anesthesia Plan Comments:        Anesthesia Quick Evaluation

## 2011-11-22 NOTE — Interval H&P Note (Signed)
History and Physical Interval Note:  11/22/2011 1:21 PM  Bridget Lane  has presented today for surgery, with the diagnosis of AFIB  The various methods of treatment have been discussed with the patient and family. After consideration of risks, benefits and other options for treatment, the patient has consented to  Procedure(s): CARDIOVERSION as a surgical intervention .  The patients' history has been reviewed, patient examined, no change in status, stable for surgery.  I have reviewed the patients' chart and labs.  Questions were answered to the patient's satisfaction.     Lelynd Poer,MD 1:22 PM 11/22/2011

## 2011-11-22 NOTE — Anesthesia Postprocedure Evaluation (Signed)
  Anesthesia Post-op Note  Patient: Bridget Lane  Procedure(s) Performed:  CARDIOVERSION  Patient Location: PACU and Short Stay  Anesthesia Type: MAC  Level of Consciousness: sedated  Airway and Oxygen Therapy: Patient Spontanous Breathing and Patient connected to nasal cannula oxygen  Post-op Pain: none  Post-op Assessment: Post-op Vital signs reviewed  Post-op Vital Signs: Reviewed and stable  Complications: No apparent anesthesia complications

## 2011-11-22 NOTE — Preoperative (Signed)
Beta Blockers   Reason not to administer Beta Blockers:Not Applicable 

## 2011-11-22 NOTE — H&P (View-Only) (Signed)
Primary Care Physician: William Hopper, MD, MD Referring Physician:  Dr Stuckey   Bridget Lane is a 78 y.o. female with a h/o persistent atrial fibrillation who presents today for EP consultation.  She reports initially being diagnosed with atrial fibrillation 7/12 after presenting with chest pain.  She was found to have afib with RVR.  IN retrospect, she thinks that she has had afib since late May or June 2012 due to significant decrease in exercise tolerance.  She was initially rate controlled with metoprolol.  She subsequently was placed on coumadin.  She underwent cardioversion 08/29/11.  She reports immediate improvement in symptoms of palpitations.  She felt "like she could run a marathon".  Unfortunately, she returned to afib within 2-3 days and her exercise tolerance again declined.  She has been in persistent afib since that time.  Today, she denies symptoms of chest pain, shortness of breath, orthopnea, PND, lower extremity edema, dizziness, presyncope, syncope, or neurologic sequela. The patient is tolerating medications without difficulties and is otherwise without complaint today.   Past Medical History  Diagnosis Date  . OA (osteoarthritis)   . DJD (degenerative joint disease)   . Macular degeneration   . Depression   . Colon polyp   . Esophageal stricture   . Hyperlipidemia   . H. pylori infection   . GERD (gastroesophageal reflux disease)   . Nontoxic multinodular goiter   . Hiatal hernia   . Atrial fibrillation     persistent   Past Surgical History  Procedure Date  . Appendectomy   . Tubal ligation     with appendectomy  . Cholecystectomy 1989  . Total abdominal hysterectomy 1972    for pain  . Esophageal dilation     > 3 X; Dr Brodie  . Colonoscopy w/ polypectomy 2005    Neg in 2010  . Cardioversion 08/29/2011    Procedure: CARDIOVERSION;  Surgeon: Thomas Stuckey, MD;  Location: [facility] OR;  Service: Cardiovascular;  Laterality: N/A;    Current Outpatient  Prescriptions  Medication Sig Dispense Refill  . acetaminophen (TYLENOL) 325 MG tablet Take 650 mg by mouth 2 (two) times daily.       . Calcium Carbonate (CALTRATE 600) 1500 MG TABS Take 1 tablet by mouth daily.        . Glucosamine-Chondroit-Vit C-Mn (GLUCOSAMINE 1500 COMPLEX PO) Take by mouth daily.        . Melatonin 3 MG TABS Take 3 mg by mouth at bedtime.       . metoprolol tartrate (LOPRESSOR) 25 MG tablet Take one and one-half tablet by mouth twice a day  90 tablet  6  . Multiple Vitamins-Minerals (OCUVITE PO) Take by mouth. 2 by mouth daily        . omeprazole (PRILOSEC) 40 MG capsule TAKE ONE CAPSULE BY MOUTH TWICE A DAY  60 capsule  3  . Probiotic Product (PROBIOTIC PO) Take by mouth daily.        . sertraline (ZOLOFT) 25 MG tablet Take 25 mg by mouth daily.        . warfarin (COUMADIN) 2.5 MG tablet Take 2.5-5 mg by mouth as directed.         Allergies  Allergen Reactions  . Penicillins     REACTION: rash  . Sulfonamide Derivatives     REACTION: rash  . Codeine     REACTION: ? hallucinations  . Fentanyl     Chest pain, dyspnea ("she almost died")  . Acetaminophen       REACTION: hurts stomach    History   Social History  . Marital Status: Married    Spouse Name: N/A    Number of Children: N/A  . Years of Education: N/A   Occupational History  . Not on file.   Social History Main Topics  . Smoking status: Never Smoker   . Smokeless tobacco: Never Used  . Alcohol Use: No  . Drug Use: No  . Sexually Active: Not on file   Other Topics Concern  . Not on file   Social History Narrative   Pt lives in [place] with spouse.  Retired from [employer].    Family History  Problem Relation Age of Onset  . Colon cancer Father   . Osteoarthritis Father   . Coronary artery disease Father   . Heart failure Mother   . Coronary artery disease Mother   . Diabetes Mother   . Breast cancer Sister   . Diabetes Sister   . Stroke Sister   . Heart  attack Sister   . Coronary artery disease Brother   . Throat cancer Brother     smoked  . Breast cancer Maternal Aunt     ROS- All systems are reviewed and negative except as per the HPI above  Physical Exam: Filed Vitals:   11/14/11 1439  BP: 106/68  Pulse: 85  Height: 5' 4" (1.626 m)  Weight: 174 lb (78.926 kg)    GEN- The patient is well appearing, alert and oriented x 3 today.   Head- normocephalic, atraumatic Eyes-  Sclera clear, conjunctiva pink Ears- hearing intact Oropharynx- clear Neck- supple, no JVP Lymph- no cervical lymphadenopathy Lungs- Clear to ausculation bilaterally, normal work of breathing Heart- irregular rate and rhythm, no murmurs, rubs or gallops, PMI not laterally displaced GI- soft, NT, ND, + BS Extremities- no clubbing, cyanosis, or edema MS- no significant deformity or atrophy Skin- no rash or lesion Psych- euthymic mood, full affect Neuro- strength and sensation are intact  EKG-  Afib, V rate 85 06/26/11- normal myoview 7/12- echo reveals LA size 32 mm, normal LV function, no significant valvular disease   Assessment and Plan:  

## 2011-11-22 NOTE — Transfer of Care (Signed)
Immediate Anesthesia Transfer of Care Note  Patient: Bridget Lane  Procedure(s) Performed:  CARDIOVERSION  Patient Location: PACU and Short Stay  Anesthesia Type: MAC  Level of Consciousness: sedated  Airway & Oxygen Therapy: Patient Spontanous Breathing and Patient connected to nasal cannula oxygen  Post-op Assessment: Report given to PACU RN and Post -op Vital signs reviewed and stable  Post vital signs: Reviewed and stable  Complications: No apparent anesthesia complications

## 2011-11-22 NOTE — Procedures (Signed)
Electrical Cardioversion Procedure Note Bridget Lane [id number] [birth year]  Procedure: Electrical Cardioversion Indications:  Atrial Fibrillation  Procedure Details Consent: Risks of procedure as well as the alternatives and risks of each were explained to the (patient/caregiver).  Consent for procedure obtained. Time Out: Verified patient identification, verified procedure, site/side was marked, verified correct patient position, special equipment/implants available, medications/allergies/relevent history reviewed, required imaging and test results available.  Performed  Patient placed on cardiac monitor, pulse oximetry, supplemental oxygen as necessary.  Sedation given: IV propofol as outlined by anesthesia Pacer pads placed anterior and posterior chest.  Cardioverted 1 time(s).  Cardioverted at 200J biphasic  Evaluation Findings: Post procedure EKG shows: NSR Complications: None Patient did tolerate procedure well.   Conclusion: Successful cardioversion of persistent atrial fibrillation to sinus rhythm with a single synchronized biphasic 200J shock delivered. No early apparent complications   Bridget Lane 11/22/2011, 2:22 PM

## 2011-11-22 NOTE — Anesthesia Postprocedure Evaluation (Signed)
  Anesthesia Post-op Note  Patient: Bridget Lane  Procedure(s) Performed:  CARDIOVERSION  Patient Location: PACU  Anesthesia Type: General  Level of Consciousness: awake, alert  and oriented  Airway and Oxygen Therapy: Patient Spontanous Breathing  Post-op Pain: none  Post-op Assessment: Post-op Vital signs reviewed, Patient's Cardiovascular Status Stable, Respiratory Function Stable, Patent Airway, No signs of Nausea or vomiting and Pain level controlled  Post-op Vital Signs: Reviewed and stable  Complications: No apparent anesthesia complications

## 2011-11-25 ENCOUNTER — Ambulatory Visit: Payer: Medicare Other | Admitting: Internal Medicine

## 2011-11-25 ENCOUNTER — Encounter (HOSPITAL_COMMUNITY): Payer: Self-pay | Admitting: Internal Medicine

## 2011-11-25 ENCOUNTER — Encounter: Payer: Medicare Other | Admitting: *Deleted

## 2011-11-27 ENCOUNTER — Ambulatory Visit (INDEPENDENT_AMBULATORY_CARE_PROVIDER_SITE_OTHER): Payer: Medicare Other | Admitting: *Deleted

## 2011-11-27 ENCOUNTER — Encounter: Payer: Self-pay | Admitting: Cardiology

## 2011-11-27 ENCOUNTER — Ambulatory Visit (INDEPENDENT_AMBULATORY_CARE_PROVIDER_SITE_OTHER): Payer: Medicare Other | Admitting: Cardiology

## 2011-11-27 VITALS — BP 116/62 | HR 50 | Ht 64.5 in | Wt 177.0 lb

## 2011-11-27 DIAGNOSIS — I4891 Unspecified atrial fibrillation: Secondary | ICD-10-CM

## 2011-11-27 DIAGNOSIS — Z7901 Long term (current) use of anticoagulants: Secondary | ICD-10-CM

## 2011-11-27 DIAGNOSIS — R079 Chest pain, unspecified: Secondary | ICD-10-CM

## 2011-11-27 NOTE — Patient Instructions (Addendum)
DO NOT take anymore Coumadin starting today.   Please have your INR rechecked (appointment at 12:30) on Friday and see Dr Riley Kill in the office.

## 2011-11-27 NOTE — Progress Notes (Signed)
HPI:  Since she was last  seen the patient is undergoing cardioversion.  She is known flecainide, and from that standpoint feels better.  However, over the past month or so she is noted exertional chest discomfort. This is a new finding for her. She did not previously mentioned. She had a particularly bad episode of this recently. She underwent cardioversion since this time, specifically with the use of flecainide.  Cardioversion was done on Nov 21, 2010 by Dr. Johney Frame. She has not had significant pain at rest. It seems to be mostly with exertion.  It is described as a heaviness that she has not experienced in the past.  This is new, and slightly progressive.  She now presents for further evaluation.    Current Outpatient Prescriptions  Medication Sig Dispense Refill  . acetaminophen (TYLENOL) 325 MG tablet Take 650 mg by mouth 2 (two) times daily.       . Calcium Carbonate (CALTRATE 600) 1500 MG TABS Take 1 tablet by mouth daily.        . flecainide (TAMBOCOR) 100 MG tablet Take 1 tablet (100 mg total) by mouth 2 (two) times daily.  60 tablet  1  . Glucosamine-Chondroit-Vit C-Mn (GLUCOSAMINE 1500 COMPLEX PO) Take by mouth daily.        . Melatonin 3 MG TABS Take 3 mg by mouth at bedtime.       . metoprolol tartrate (LOPRESSOR) 25 MG tablet Take 75 mg by mouth 2 (two) times daily.      . Multiple Vitamins-Minerals (OCUVITE PO) Take by mouth. 2 by mouth daily        . omeprazole (PRILOSEC) 40 MG capsule TAKE ONE CAPSULE BY MOUTH TWICE A DAY  60 capsule  3  . Probiotic Product (PROBIOTIC PO) Take by mouth daily.        . sertraline (ZOLOFT) 25 MG tablet Take 25 mg by mouth daily.        Marland Kitchen warfarin (COUMADIN) 2.5 MG tablet Take 2.5-5 mg by mouth as directed. Takes 1 tablet every day except she takes 2 tablets on Saturday.        Allergies  Allergen Reactions  . Penicillins     REACTION: rash  . Sulfonamide Derivatives     REACTION: rash  . Codeine     REACTION: ? hallucinations  . Fentanyl       Chest pain, dyspnea ("she almost died")  . Acetaminophen     REACTION: hurts stomach    Past Medical History  Diagnosis Date  . OA (osteoarthritis)   . DJD (degenerative joint disease)   . Macular degeneration   . Depression   . Colon polyp   . Esophageal stricture   . Hyperlipidemia   . H. pylori infection   . GERD (gastroesophageal reflux disease)   . Nontoxic multinodular goiter   . Hiatal hernia   . Atrial fibrillation     persistent    Past Surgical History  Procedure Date  . Appendectomy   . Tubal ligation     with appendectomy  . Cholecystectomy 1989  . Total abdominal hysterectomy 1972    for pain  . Esophageal dilation     > 3 X; Dr Juanda Chance  . Colonoscopy w/ polypectomy 2005    Neg in 2010  . Cardioversion 08/29/2011    Procedure: CARDIOVERSION;  Surgeon: Shawnie Pons, MD;  Location: [facility] OR;  Service: Cardiovascular;  Laterality: N/A;  . Cardioversion 11/22/2011    Procedure: CARDIOVERSION;  Surgeon: Gardiner Rhyme, MD;  Location: [facility] OR;  Service: Cardiovascular;  Laterality: N/A;    Family History  Problem Relation Age of Onset  . Colon cancer Father   . Osteoarthritis Father   . Coronary artery disease Father   . Heart failure Mother   . Coronary artery disease Mother   . Diabetes Mother   . Breast cancer Sister   . Diabetes Sister   . Stroke Sister   . Heart attack Sister   . Coronary artery disease Brother   . Throat cancer Brother     smoked  . Breast cancer Maternal Aunt     History   Social History  . Marital Status: Married    Spouse Name: N/A    Number of Children: N/A  . Years of Education: N/A   Occupational History  . Not on file.   Social History Main Topics  . Smoking status: Never Smoker   . Smokeless tobacco: Never Used  . Alcohol Use: No  . Drug Use: No  . Sexually Active: Not on file   Other Topics Concern  . Not on file   Social History Narrative   Pt lives in [place] with spouse.  Retired from [employer].    ROS: Please see the HPI.  All other systems reviewed and negative.  PHYSICAL EXAM:  BP 116/62  Pulse 50  Ht 5' 4.5" (1.638 m)  Wt 177 lb (80.287 kg)  BMI 29.91 kg/m2  General: Well developed, well nourished, in no acute distress. Head:  Normocephalic and atraumatic. Neck: no JVD Lungs: Clear to auscultation and percussion. Heart: Normal S1 and S2.  No murmur, rubs or gallops.  Pulses: Pulses normal in all 4 extremities. Extremities: No clubbing or cyanosis. No edema. Neurologic: Alert and oriented x 3.  EKG:  SB.  Slightly prolonged QT.  No acute changes.  Delay in R wave progression  NUC Study  (06/26/11):  Rest Procedure: Myocardial perfusion imaging was performed at rest 45 minutes following the intravenous administration of Technetium 72m Tetrofosmin.  Rest ECG: Atrial Fibrilliation  Stress Procedure: The patient received IV Lexiscan 0.4 mg over 15-seconds with concurrent low level exercise and then Technetium 74m Tetrofosmin was injected at 30-seconds while the patient continued walking one more minute. There were nonspecific ST-T wave changes with Lexiscan. She did c/o chest pressure with infusion. Quantitative spect images were obtained after a 45-minute delay.  Stress ECG: There are no significant ECG changes from baseline.  QPS  Raw Data Images: Normal; no motion artifact; normal heart/lung ratio.  Stress Images: Normal homogeneous uptake in all areas of the myocardium.  Rest Images: Normal homogeneous uptake in all areas of the myocardium.  Subtraction (SDS): No evidence of ischemia.  Transient Ischemic Dilatation (Normal <1.22): .97  Lung/Heart Ratio (Normal <0.45): .33  Quantitative Gated Spect Images  QGS EDV: 77 ml  QGS ESV: 30 ml  QGS cine images: NL LV Function; NL Wall Motion  QGS EF: 61%   ECHO  (04/2011) Study Conclusions    - Left ventricle: The cavity size was normal. Wall thickness was     normal. Systolic function was normal.  The estimated ejection     fraction was in the range of 55% to 60%.   - Mitral valve: Mild regurgitation.   - Left atrium: The atrium was mildly dilated.   - Right ventricle: RV appears dilated in 4 chamber view but less so     in subcostal image  provided The cavity size was moderately     dilated.   - Right atrium: The atrium was moderately dilated.   - Atrial septum: No defect or patent foramen ovale was identified.     ASSESSMENT AND PLAN:

## 2011-11-29 ENCOUNTER — Ambulatory Visit (INDEPENDENT_AMBULATORY_CARE_PROVIDER_SITE_OTHER): Payer: Medicare Other | Admitting: Cardiology

## 2011-11-29 ENCOUNTER — Ambulatory Visit (INDEPENDENT_AMBULATORY_CARE_PROVIDER_SITE_OTHER): Payer: Medicare Other | Admitting: *Deleted

## 2011-11-29 DIAGNOSIS — I4891 Unspecified atrial fibrillation: Secondary | ICD-10-CM

## 2011-11-29 DIAGNOSIS — R079 Chest pain, unspecified: Secondary | ICD-10-CM

## 2011-11-29 DIAGNOSIS — Z7901 Long term (current) use of anticoagulants: Secondary | ICD-10-CM

## 2011-11-29 NOTE — Patient Instructions (Signed)
You will be admitted to [facility] tomorrow. The admitting office will call you tomorrow with your room assignment.

## 2011-11-29 NOTE — Assessment & Plan Note (Signed)
Patient has chest pain that sounds like classic angina pectoris.  In light of flecainide, it is important to know the status of her coronary anatomy.  Her prior nuc study is normal, but the symptoms sound classic.  She did have mild RV enlargement on her echo done in the setting of atrial fib, but her PA pressures were normal.  I have discussed her case with Dr. Johney Frame.  She has just be cardioverted so I am concerned about bringing her off of her anticoagulation.  This has been discussed with her in detail.  We agree it would be best to let her INR drift down, then admit her for IV heparin in anticipation of cath.  This will be arrange for Monday, and I will see her back on Friday to check INR and make final arrangements.  She is agreeable to this approach. She will need to be started back on anticoagulation as soon as her procedure is complete.  Of note, it would be helpful to have R heart cath information with sat run to make sure it is not an issue.  Her echo did not show an atrial septal defect or color abnormality, but SVC sat for rule out of sinus venosus or anomalous vein would be helpful.  We will repeat her echo again prior to cath.

## 2011-11-29 NOTE — Progress Notes (Signed)
HPI:  She returns for follow up. Her INR is 2.4.  She has symptoms worrisome for progressive angina.  As such, we plan to admit her to the hospital tomorrow for evaluation of CAD.  She is on flecainide for af with cardioversion just over one week ago.  Therefore, we need a tight anti coag window.  She has had prior nuc which shows normal perfusion.  However, her symptoms are fairly classic.  Also, she has had prior echo.  This suggested perhaps mild RV enlargement, but no visible ASD and normal RH pressures.  We had deferred repeating it until she was in normal rhythm.  Dr. Jones Broom will do her cath, and she is happy as he takes care of her husband for a while.     Current Outpatient Prescriptions  Medication Sig Dispense Refill  . acetaminophen (TYLENOL) 325 MG tablet Take 650 mg by mouth 2 (two) times daily.       . Calcium Carbonate (CALTRATE 600) 1500 MG TABS Take 1 tablet by mouth daily.        . flecainide (TAMBOCOR) 100 MG tablet Take 1 tablet (100 mg total) by mouth 2 (two) times daily.  60 tablet  1  . Glucosamine-Chondroit-Vit C-Mn (GLUCOSAMINE 1500 COMPLEX PO) Take by mouth daily.        . Melatonin 3 MG TABS Take 3 mg by mouth at bedtime.       . metoprolol tartrate (LOPRESSOR) 25 MG tablet Take 75 mg by mouth 2 (two) times daily.      . Multiple Vitamins-Minerals (OCUVITE PO) Take by mouth. 2 by mouth daily        . omeprazole (PRILOSEC) 40 MG capsule TAKE ONE CAPSULE BY MOUTH TWICE A DAY  60 capsule  3  . Probiotic Product (PROBIOTIC PO) Take by mouth daily.        . sertraline (ZOLOFT) 25 MG tablet Take 25 mg by mouth daily.        Marland Kitchen warfarin (COUMADIN) 2.5 MG tablet ON HOLD starting 11/27/11-Takes 1 tablet every day except she takes 2 tablets on Saturday.        Allergies  Allergen Reactions  . Penicillins     REACTION: rash  . Sulfonamide Derivatives     REACTION: rash  . Codeine     REACTION: ? hallucinations  . Fentanyl     Chest pain, dyspnea ("she almost died")    . Acetaminophen     REACTION: hurts stomach    Past Medical History  Diagnosis Date  . OA (osteoarthritis)   . DJD (degenerative joint disease)   . Macular degeneration   . Depression   . Colon polyp   . Esophageal stricture   . Hyperlipidemia   . H. pylori infection   . GERD (gastroesophageal reflux disease)   . Nontoxic multinodular goiter   . Hiatal hernia   . Atrial fibrillation     persistent    Past Surgical History  Procedure Date  . Appendectomy   . Tubal ligation     with appendectomy  . Cholecystectomy 1989  . Total abdominal hysterectomy 1972    for pain  . Esophageal dilation     > 3 X; Dr Juanda Chance  . Colonoscopy w/ polypectomy 2005    Neg in 2010  . Cardioversion 08/29/2011    Procedure: CARDIOVERSION;  Surgeon: Shawnie Pons, MD;  Location: [facility] OR;  Service: Cardiovascular;  Laterality: N/A;  . Cardioversion 11/22/2011    Procedure: CARDIOVERSION;  Surgeon: Gardiner Rhyme, MD;  Location: [facility] OR;  Service: Cardiovascular;  Laterality: N/A;    Family History  Problem Relation Age of Onset  . Colon cancer Father   . Osteoarthritis Father   . Coronary artery disease Father   . Heart failure Mother   . Coronary artery disease Mother   . Diabetes Mother   . Breast cancer Sister   . Diabetes Sister   . Stroke Sister   . Heart attack Sister   . Coronary artery disease Brother   . Throat cancer Brother     smoked  . Breast cancer Maternal Aunt     History   Social History  . Marital Status: Married    Spouse Name: N/A    Number of Children: N/A  . Years of Education: N/A   Occupational History  . Not on file.   Social History Main Topics  . Smoking status: Never Smoker   . Smokeless tobacco: Never Used  . Alcohol Use: No  . Drug Use: No  . Sexually Active: Not on file   Other Topics Concern  . Not on file   Social History Narrative   Pt lives in [place] with spouse.  Retired from [employer].    ROS: Please see  the HPI.  All other systems reviewed and negative.  PHYSICAL EXAM:  BP 119/56  Pulse 51  Ht 5\' 4"  (1.626 m)  Wt 177 lb 6.4 oz (80.468 kg)  BMI 30.45 kg/m2  General: Well developed, well nourished, in no acute distress. Head:  Normocephalic and atraumatic. Neck: no JVD Lungs: Clear to auscultation and percussion. Heart: Normal S1 and S2.  Minimal SEM.   Abdomen:  Normal bowel sounds; soft; non tender; no organomegaly Pulses: Pulses normal in all 4 extremities. Extremities: No clubbing or cyanosis. No edema. Neurologic: Alert and oriented x 3.  EKG:  Not done today ASSESSMENT AND PLAN:

## 2011-11-29 NOTE — Assessment & Plan Note (Signed)
Now in NSR.  Remains on flecainide but may need to be changed if CAD.  Dr. Johney Frame has seen.

## 2011-11-29 NOTE — Assessment & Plan Note (Signed)
See my note from two days ago.  Needs admission for possible cath, but mostly for careful titration of anticoagulation in light of cardioversion last week.  She is amenable to admission, and I would favor left and right heart cath next Monday for eval of CAD.  She is agreeable to this.   She understands risks.  Her husband is a patient of Dr. Jesusita Oka, and he will do procedure.

## 2011-11-30 ENCOUNTER — Other Ambulatory Visit: Payer: Self-pay

## 2011-11-30 ENCOUNTER — Inpatient Hospital Stay (HOSPITAL_COMMUNITY)
Admission: AD | Admit: 2011-11-30 | Discharge: 2011-12-03 | DRG: 287 | Disposition: A | Discharge status: Home / Self Care | Payer: Medicare Other | Source: Ambulatory Visit | Attending: Cardiology | Admitting: Cardiology

## 2011-11-30 DIAGNOSIS — K449 Diaphragmatic hernia without obstruction or gangrene: Secondary | ICD-10-CM | POA: Diagnosis present

## 2011-11-30 DIAGNOSIS — Z880 Allergy status to penicillin: Secondary | ICD-10-CM

## 2011-11-30 DIAGNOSIS — Z886 Allergy status to analgesic agent status: Secondary | ICD-10-CM

## 2011-11-30 DIAGNOSIS — R0789 Other chest pain: Principal | ICD-10-CM | POA: Diagnosis present

## 2011-11-30 DIAGNOSIS — I4891 Unspecified atrial fibrillation: Secondary | ICD-10-CM | POA: Diagnosis present

## 2011-11-30 DIAGNOSIS — Z90710 Acquired absence of both cervix and uterus: Secondary | ICD-10-CM

## 2011-11-30 DIAGNOSIS — K219 Gastro-esophageal reflux disease without esophagitis: Secondary | ICD-10-CM | POA: Diagnosis present

## 2011-11-30 DIAGNOSIS — R079 Chest pain, unspecified: Secondary | ICD-10-CM | POA: Diagnosis present

## 2011-11-30 DIAGNOSIS — F329 Major depressive disorder, single episode, unspecified: Secondary | ICD-10-CM | POA: Diagnosis present

## 2011-11-30 DIAGNOSIS — Z882 Allergy status to sulfonamides status: Secondary | ICD-10-CM

## 2011-11-30 DIAGNOSIS — M1990 Unspecified osteoarthritis, unspecified site: Secondary | ICD-10-CM | POA: Diagnosis present

## 2011-11-30 DIAGNOSIS — F3289 Other specified depressive episodes: Secondary | ICD-10-CM | POA: Diagnosis present

## 2011-11-30 DIAGNOSIS — Z860100 Personal history of colon polyps, unspecified: Secondary | ICD-10-CM

## 2011-11-30 DIAGNOSIS — K222 Esophageal obstruction: Secondary | ICD-10-CM | POA: Diagnosis present

## 2011-11-30 DIAGNOSIS — E785 Hyperlipidemia, unspecified: Secondary | ICD-10-CM | POA: Diagnosis present

## 2011-11-30 DIAGNOSIS — E042 Nontoxic multinodular goiter: Secondary | ICD-10-CM | POA: Diagnosis present

## 2011-11-30 DIAGNOSIS — B029 Zoster without complications: Secondary | ICD-10-CM | POA: Diagnosis present

## 2011-11-30 LAB — CBC
MCH: 28.3 pg (ref 26.0–34.0)
MCV: 86.5 fL (ref 78.0–100.0)
Platelets: 242 10*3/uL (ref 150–400)
RDW: 14 % (ref 11.5–15.5)

## 2011-11-30 LAB — DIFFERENTIAL
Basophils Absolute: 0 10*3/uL (ref 0.0–0.1)
Eosinophils Absolute: 0.1 10*3/uL (ref 0.0–0.7)
Eosinophils Relative: 2 % (ref 0–5)
Lymphs Abs: 2.2 10*3/uL (ref 0.7–4.0)
Monocytes Absolute: 0.5 10*3/uL (ref 0.1–1.0)

## 2011-11-30 LAB — COMPREHENSIVE METABOLIC PANEL
AST: 18 U/L (ref 0–37)
BUN: 16 mg/dL (ref 6–23)
CO2: 29 mEq/L (ref 19–32)
Calcium: 9.4 mg/dL (ref 8.4–10.5)
Creatinine, Ser: 0.93 mg/dL (ref 0.50–1.10)
GFR calc non Af Amer: 58 mL/min — ABNORMAL LOW (ref >90)

## 2011-11-30 LAB — CARDIAC PANEL(CRET KIN+CKTOT+MB+TROPI)
CK, MB: 2.1 ng/mL (ref 0.3–4.0)
Total CK: 73 U/L (ref 7–177)

## 2011-11-30 LAB — PROTIME-INR: Prothrombin Time: 20.6 seconds — ABNORMAL HIGH (ref 11.6–15.2)

## 2011-11-30 LAB — TSH: TSH: 0.728 u[IU]/mL (ref 0.350–4.500)

## 2011-11-30 MED ORDER — HEPARIN SOD (PORCINE) IN D5W 100 UNIT/ML IV SOLN
1350.0000 [IU]/h | INTRAVENOUS | Status: DC
  Administered 2011-11-30: 900 [IU]/h via INTRAVENOUS
  Administered 2011-12-01: 1350 [IU]/h via INTRAVENOUS
  Administered 2011-12-01: 1200 [IU]/h via INTRAVENOUS
  Filled 2011-11-30 (×4): qty 250

## 2011-11-30 MED ORDER — NITROGLYCERIN 0.4 MG SL SUBL: 0.4000 mg | SUBLINGUAL_TABLET | SUBLINGUAL | Status: DC | PRN

## 2011-11-30 MED ORDER — SODIUM CHLORIDE 0.9 % IJ SOLN: 3.0000 mL | INTRAMUSCULAR | Status: DC | PRN

## 2011-11-30 MED ORDER — FLECAINIDE ACETATE 100 MG PO TABS
100.0000 mg | ORAL_TABLET | Freq: Two times a day (BID) | ORAL | Status: DC
  Administered 2011-11-30 – 2011-12-03 (×6): 100 mg via ORAL
  Filled 2011-11-30 (×9): qty 1

## 2011-11-30 MED ORDER — ASPIRIN 81 MG PO CHEW: 324.0000 mg | CHEWABLE_TABLET | ORAL | Status: AC

## 2011-11-30 MED ORDER — SERTRALINE HCL 25 MG PO TABS
25.0000 mg | ORAL_TABLET | Freq: Every day | ORAL | Status: DC
  Administered 2011-11-30 – 2011-12-02 (×3): 25 mg via ORAL
  Filled 2011-11-30 (×5): qty 1

## 2011-11-30 MED ORDER — SODIUM CHLORIDE 0.9 % IJ SOLN
3.0000 mL | Freq: Two times a day (BID) | INTRAMUSCULAR | Status: DC
  Administered 2011-11-30 – 2011-12-02 (×3): 3 mL via INTRAVENOUS

## 2011-11-30 MED ORDER — METOPROLOL TARTRATE 50 MG PO TABS
50.0000 mg | ORAL_TABLET | Freq: Three times a day (TID) | ORAL | Status: DC
  Administered 2011-12-02 – 2011-12-03 (×3): 50 mg via ORAL
  Filled 2011-11-30 (×12): qty 1

## 2011-11-30 MED ORDER — PANTOPRAZOLE SODIUM 40 MG PO TBEC
40.0000 mg | DELAYED_RELEASE_TABLET | Freq: Every day | ORAL | Status: DC
  Administered 2011-11-30 – 2011-12-02 (×3): 40 mg via ORAL
  Filled 2011-11-30 (×3): qty 1

## 2011-11-30 MED ORDER — ASPIRIN 300 MG RE SUPP
300.0000 mg | RECTAL | Status: AC
  Filled 2011-11-30: qty 1

## 2011-11-30 MED ORDER — ONDANSETRON HCL 4 MG/2ML IJ SOLN: 4.0000 mg | Freq: Four times a day (QID) | INTRAMUSCULAR | Status: DC | PRN

## 2011-11-30 MED ORDER — ASPIRIN EC 81 MG PO TBEC
81.0000 mg | DELAYED_RELEASE_TABLET | Freq: Every day | ORAL | Status: DC
  Administered 2011-12-01: 81 mg via ORAL
  Filled 2011-11-30: qty 1

## 2011-11-30 NOTE — Progress Notes (Signed)
Discussed with Dr.  Shawnie Pons this morning.  Patient arrived to 2000 today.  She is in stable condition.  No chest pain or dyspnea.  Heparin orders already written.  ECG demonstrates NSR, no acute changes. Tereso Newcomer, PA-C  4:17 PM 11/30/2011

## 2011-11-30 NOTE — Progress Notes (Signed)
ANTICOAGULATION CONSULT NOTE - Follow Up Consult  Pharmacy Consult for Heparin  Indication: Chest pain  Allergies  Allergen Reactions  . Penicillins     REACTION: rash  . Sulfonamide Derivatives     REACTION: rash  . Codeine     REACTION: ? hallucinations  . Fentanyl     Chest pain, dyspnea ("she almost died")  . Acetaminophen     REACTION: hurts stomach    Patient Measurements: Height: 5\' 4"  (162.6 cm) Weight: 177 lb 7.5 oz (80.5 kg) IBW/kg (Calculated) : 54.7  Heparin Dosing Weight: 72kg  Vital Signs: Temp: 98 F (36.7 C) (02/09 1500) Temp src: Oral (02/09 1500) BP: 98/58 mmHg (02/09 1500) Pulse Rate: 62  (02/09 1500)  Labs:  Basename 11/30/11 1538 11/29/11 1229  HGB 11.1* --  HCT 33.9* --  PLT 242 --  APTT 39* --  LABPROT 20.6* --  INR 1.73* 2.4  HEPARINUNFRC -- --  CREATININE -- --  CKTOTAL -- --  CKMB -- --  TROPONINI -- --   Estimated Creatinine Clearance: 53.4 ml/min (by C-G formula based on Cr of 0.92).   Medications:  Scheduled:    . aspirin  324 mg Oral NOW   Or  . aspirin  300 mg Rectal NOW  . aspirin EC  81 mg Oral Daily  . flecainide  100 mg Oral Q12H  . metoprolol tartrate  50 mg Oral Q8H  . pantoprazole  40 mg Oral Q1200  . sertraline  25 mg Oral Daily  . sodium chloride  3 mL Intravenous Q12H    Assessment: 78yo female on Coumadin for A.Fib, to start Heparin for chest pain when INR < 2 (INR=1.73).   Goal of Therapy:  Heparin level 0.3-0.7 units/ml   Plan:  -Will start heparin with no bolus as INR elevated -Begin infusion at 900 units/hr (~12 units/kg/hr) -Heparin level in 8 hrs and daily with CBC daily  Benny Lennert 11/30/2011,4:42 PM

## 2011-11-30 NOTE — Progress Notes (Addendum)
ANTICOAGULATION CONSULT NOTE - Initial Consult  Pharmacy Consult for Heparin Indication:  Chest pain  Allergies  Allergen Reactions  . Penicillins     REACTION: rash  . Sulfonamide Derivatives     REACTION: rash  . Codeine     REACTION: ? hallucinations  . Fentanyl     Chest pain, dyspnea ("she almost died")  . Acetaminophen     REACTION: hurts stomach    Patient Measurements: Height: 5\' 4"  (162.6 cm) Weight: 177 lb 7.5 oz (80.5 kg) IBW/kg (Calculated) : 54.7   Vital Signs: Temp: 97 F (36.1 C) (02/09 1239) Temp src: Oral (02/09 1239) BP: 102/53 mmHg (02/09 1239) Pulse Rate: 53  (02/09 1239)  Labs:  Basename 11/29/11 1229  HGB --  HCT --  PLT --  APTT --  LABPROT --  INR 2.4  HEPARINUNFRC --  CREATININE --  CKTOTAL --  CKMB --  TROPONINI --   Estimated Creatinine Clearance: 53.4 ml/min (by C-G formula based on Cr of 0.92).  Medical History: Past Medical History  Diagnosis Date  . OA (osteoarthritis)   . DJD (degenerative joint disease)   . Macular degeneration   . Depression   . Colon polyp   . Esophageal stricture   . Hyperlipidemia   . H. pylori infection   . GERD (gastroesophageal reflux disease)   . Nontoxic multinodular goiter   . Hiatal hernia   . Atrial fibrillation     persistent    Medications:  Scheduled:    . aspirin  324 mg Oral NOW   Or  . aspirin  300 mg Rectal NOW  . aspirin EC  81 mg Oral Daily  . flecainide  100 mg Oral Q12H  . metoprolol tartrate  50 mg Oral Q8H  . pantoprazole  40 mg Oral Q1200  . sertraline  25 mg Oral Daily  . sodium chloride  3 mL Intravenous Q12H    Assessment: 78yo female on Coumadin for A.Fib, to start Heparin for chest pain when INR < 2.  INR in outpt MD office was 2.4 yesterday, now off Coumadin.  Admission labs are all pending at this moment.   Plan:  1.  F/U INR 2.  Start Heparin when INR < 2  Bridget Lane P 11/30/2011,2:20 PM  Addendum 1645:  INR =  1.73, will begin Heparin for  chest pain with goal HL 0.3-0.7.  Hg = 11.1 & pltc = 242.  1.  Heparin 900 units/hr, with no bolus given INR > 1.5. 2.  Heparin level in 6hr 3.  Daily heparin level & CBC.  Bridget Lane 11/30/2011

## 2011-11-30 NOTE — H&P (Addendum)
Bridget Lane   11/29/2011 1:00 PM Office Visit  MRN: [id number]   Description: 78 year old female  Provider: Shawnie Pons, MD  Department: [facility]        Referring Provider     Pecola Lawless, MD      Diagnoses     Chest pain     786.50      HPI:  Bridget Lane is admitted today for initiation of intravenous heparin.  Her INR yesterday was 2.4 and her warfarin is on hold.   She is on flecainide for atrial fibrillation  with cardioversion just over one week ago done by Dr. Johney Frame.  She had noted, but not said much about the fact that she was having exertional chest tightness.  This has been consistent, and somewhat progressive since then.  She continued to have symptoms and saw me in the office on Wednesday.  Dr. Johney Frame and I discussed her case in detail.  Given her history, with af treated with flecainide, and prior negative perfusion study in the past six months, we felt we need to proceed with catheterization, and the patient is in agreement.  She understands the need for continued anticoagulation.   Therefore, we need a tight anti coag window.  She has had prior nuc which shows normal perfusion.  However, her symptoms are fairly classic.  Also, she has had prior echo.  This suggested perhaps mild RV enlargement, but no visible ASD and normal RH pressures.  We had deferred repeating it until she was in normal rhythm.  Dr. Jones Broom has been notified and  will do her cath, and she is happy as he taken care of her husband for a while.  She was agreeable to admission.          Current Outpatient Prescriptions   Medication  Sig  Dispense  Refill   .  acetaminophen (TYLENOL) 325 MG tablet  Take 650 mg by mouth 2 (two) times daily.          .  Calcium Carbonate (CALTRATE 600) 1500 MG TABS  Take 1 tablet by mouth daily.           .  flecainide (TAMBOCOR) 100 MG tablet  Take 1 tablet (100 mg total) by mouth 2 (two) times daily.   60 tablet   1   .  Glucosamine-Chondroit-Vit  C-Mn (GLUCOSAMINE 1500 COMPLEX PO)  Take by mouth daily.           .  Melatonin 3 MG TABS  Take 3 mg by mouth at bedtime.          .  metoprolol tartrate (LOPRESSOR) 25 MG tablet  Take 75 mg by mouth 2 (two) times daily.         .  Multiple Vitamins-Minerals (OCUVITE PO)  Take by mouth. 2 by mouth daily            .  omeprazole (PRILOSEC) 40 MG capsule  TAKE ONE CAPSULE BY MOUTH TWICE A DAY   60 capsule   3   .  Probiotic Product (PROBIOTIC PO)  Take by mouth daily.           .  sertraline (ZOLOFT) 25 MG tablet  Take 25 mg by mouth daily.           Marland Kitchen  warfarin (COUMADIN) 2.5 MG tablet  ON HOLD starting 11/27/11-Takes 1 tablet every day except she takes 2 tablets on Saturday.  Allergies   Allergen  Reactions   .  Penicillins         REACTION: rash   .  Sulfonamide Derivatives         REACTION: rash   .  Codeine         REACTION: ? hallucinations   .  Fentanyl         Chest pain, dyspnea ("she almost died")   .  Acetaminophen         REACTION: hurts stomach       Past Medical History   Diagnosis  Date   .  OA (osteoarthritis)     .  DJD (degenerative joint disease)     .  Macular degeneration     .  Depression     .  Colon polyp     .  Esophageal stricture     .  Hyperlipidemia     .  H. pylori infection     .  GERD (gastroesophageal reflux disease)     .  Nontoxic multinodular goiter     .  Hiatal hernia     .  Atrial fibrillation         persistent       Past Surgical History   Procedure  Date   .  Appendectomy     .  Tubal ligation         with appendectomy   .  Cholecystectomy  1989   .  Total abdominal hysterectomy  1972       for pain   .  Esophageal dilation         > 3 X; Dr Juanda Chance   .  Colonoscopy w/ polypectomy  2005       Neg in 2010   .  Cardioversion  08/29/2011       Procedure: CARDIOVERSION;  Surgeon: Shawnie Pons, MD;  Location: [facility] OR;  Service: Cardiovascular;  Laterality: N/A;   .  Cardioversion  11/22/2011       Procedure:  CARDIOVERSION;  Surgeon: Gardiner Rhyme, MD;  Location: [facility] OR;  Service: Cardiovascular;  Laterality: N/A;       Family History   Problem  Relation  Age of Onset   .  Colon cancer  Father     .  Osteoarthritis  Father     .  Coronary artery disease  Father     .  Heart failure  Mother     .  Coronary artery disease  Mother     .  Diabetes  Mother     .  Breast cancer  Sister     .  Diabetes  Sister     .  Stroke  Sister     .  Heart attack  Sister     .  Coronary artery disease  Brother     .  Throat cancer  Brother         smoked   .  Breast cancer  Maternal Aunt         History       Social History   .  Marital Status:  Married       Spouse Name:  N/A       Number of Children:  N/A   .  Years of Education:  N/A       Occupational History   .  Not on file.       Social History Main  Topics   .  Smoking status:  Never Smoker    .  Smokeless tobacco:  Never Used   .  Alcohol Use:  No   .  Drug Use:  No   .  Sexually Active:  Not on file       Other Topics  Concern   .  Not on file       Social History Narrative     Pt lives in [place] with spouse.  Retired from [employer].      ROS: Please see the HPI.  All other systems reviewed and negative.   PHYSICAL EXAM:   BP 119/56  Pulse 51  Ht 5\' 4"  (1.626 m)  Wt 177 lb 6.4 oz (80.468 kg)  BMI 30.45 kg/m2   General: Well developed, well nourished, in no acute distress. Head:  Normocephalic and atraumatic. Neck: no JVD Lungs: Clear to auscultation and percussion. Heart: Normal S1 and S2.  Minimal SEM.    Abdomen:  Normal bowel sounds; soft; non tender; no organomegaly Pulses: Pulses normal in all 4 extremities. Extremities: No clubbing or cyanosis. No edema. Neurologic: Alert and oriented x 3.   EKG:  pending ASSESSMENT AND PLAN:    See my note from two days ago.  Needs admission for possible cath, but mostly for careful titration of anticoagulation in light of cardioversion  last week.  She is amenable to admission, and I would favor left and right heart cath next Monday for eval of CAD.  She is agreeable to this.    She understands risks.  Her husband is a patient of Dr. Jesusita Oka, and he will do procedure.   1.  Atrial fib with recent cardioversion one week ago 2.  Exertional chest tightness, class III AP, with prior normal nuc study 3.  Mildly abnormal echo study with some RV enlargement   Borderline on echo without ASD or color flow noted 4.  Recent conversion to NSR.   5.  Prior esophageal disease  Plan   1.  Admit with immediate start of IV heparin with INR check today 2.  Redo labs 3.  Plan cardiac cath study for Monday with definite left heart  (?radial and arm) 4..  Prior discussion with Dr. Johney Frame---? Alternative agent to warfarin like Xarelto----would not start immediately if leg case due to risk of bleed.  Alternatively, we could right heart later after one month if cors from radial are negative.    Shawnie Pons 9:31 AM 11/30/2011              Vitals - Last Recorded       BP Pulse Ht Wt BMI    119/56  51  5\' 4"  (1.626 m)  177 lb 6.4 oz (80.468 kg)  30.45 kg/m2             Level of Service     PR OFFICE OUTPATIENT VISIT 15 MINUTES [99213]         All Charges for This Encounter       Code Description Service Date Service Provider Modifiers Quantity    99213 PR OFFICE OUTPATIENT VISIT 15 MINUTES 11/29/2011 Shawnie Pons, MD   1      Patient Instructions     You will be admitted to [facility] tomorrow. The admitting office will call you tomorrow with your room assignment.             Previous Visit  Provider Department Encounter #    11/27/2011 10:00 AM Shawnie Pons, MD [facility] [id number]             Pt admitted to [facility].  I have seen her today.  She remains in sinus rhythm.  She has had exertional SOB as per Dr Riley Kill. Exam is stable. INR today is <2.  Given recent cardioversion,  I agree with Dr Riley Kill that heparin gtt is prudent. Order for heparin per pharmacy has been placed. She will proceed with cath on Monday and then resume anticoagulation.   Continue flecainide unless she has significant CAD on cath.  Jamaurion Slemmer,MD 4:53 PM 11/30/2011

## 2011-12-01 DIAGNOSIS — I4891 Unspecified atrial fibrillation: Secondary | ICD-10-CM

## 2011-12-01 DIAGNOSIS — R079 Chest pain, unspecified: Secondary | ICD-10-CM

## 2011-12-01 LAB — HEPARIN LEVEL (UNFRACTIONATED)
Heparin Unfractionated: <0.1 IU/mL — ABNORMAL LOW (ref 0.30–0.70)
Heparin Unfractionated: 0.26 IU/mL — ABNORMAL LOW (ref 0.30–0.70)
Heparin Unfractionated: 0.32 IU/mL (ref 0.30–0.70)

## 2011-12-01 LAB — PROTIME-INR: INR: 1.6 — ABNORMAL HIGH (ref 0.00–1.49)

## 2011-12-01 LAB — CARDIAC PANEL(CRET KIN+CKTOT+MB+TROPI)
CK, MB: 2.1 ng/mL (ref 0.3–4.0)
Relative Index: INVALID (ref 0.0–2.5)
Total CK: 65 U/L (ref 7–177)
Troponin I: <0.3 ng/mL (ref <0.30)

## 2011-12-01 LAB — CBC
Hemoglobin: 11.1 g/dL — ABNORMAL LOW (ref 12.0–15.0)
RBC: 3.87 MIL/uL (ref 3.87–5.11)

## 2011-12-01 MED ORDER — ASPIRIN 81 MG PO CHEW
324.0000 mg | CHEWABLE_TABLET | ORAL | Status: AC
  Administered 2011-12-02: 324 mg via ORAL
  Filled 2011-12-01: qty 4

## 2011-12-01 MED ORDER — SODIUM CHLORIDE 0.9 % IV SOLN
250.0000 mL | INTRAVENOUS | Status: DC | PRN
  Administered 2011-12-01: 250 mL via INTRAVENOUS

## 2011-12-01 MED ORDER — ASPIRIN EC 81 MG PO TBEC
81.0000 mg | DELAYED_RELEASE_TABLET | Freq: Every day | ORAL | Status: DC
  Filled 2011-12-01: qty 1

## 2011-12-01 MED ORDER — SODIUM CHLORIDE 0.9 % IV SOLN
INTRAVENOUS | Status: DC
  Administered 2011-12-02: 1000 mL via INTRAVENOUS

## 2011-12-01 MED ORDER — SODIUM CHLORIDE 0.9 % IJ SOLN: 3.0000 mL | INTRAMUSCULAR | Status: DC | PRN

## 2011-12-01 MED ORDER — SODIUM CHLORIDE 0.9 % IJ SOLN: 3.0000 mL | Freq: Two times a day (BID) | INTRAMUSCULAR | Status: DC

## 2011-12-01 NOTE — Progress Notes (Signed)
Patient examined chart reviewed  Agree with findings as documented by PA

## 2011-12-01 NOTE — Progress Notes (Signed)
Progress Note  Name:  Bridget Lane  Date:  12/01/2011 9:12 AM    Subjective:  No chest pain or dyspnea.  Objective:   Filed Vitals:   11/30/11 1239 11/30/11 1500 11/30/11 2237 12/01/11 0428  BP: 102/53 98/58 113/56 98/48  Pulse: 53 62 53 52  Temp: 97 F (36.1 C) 98 F (36.7 C) 98.2 F (36.8 C) 98.3 F (36.8 C)  TempSrc: Oral Oral Oral Oral  Resp: 18 18 18 18   Height: 5\' 4"  (1.626 m)     Weight: 177 lb 7.5 oz (80.5 kg)   177 lb 0.5 oz (80.3 kg)  SpO2: 96% 96% 96% 95%    Intake/Output Summary (Last 24 hours) at 12/01/11 0912 Last data filed at 12/01/11 0335  Gross per 24 hour  Intake    252 ml  Output    300 ml  Net    -48 ml    Tele:  Sinus brady, HR 50s  PHYSICAL EXAM General: no acute distress Neck:  No JVD Lungs: Clear to auscultation bilaterally  Heart: normal S1, S2;  Regular rate and rhythm, no murmur Abdomen: soft, nontender Extremities: no clubbing, cyanosis or edema Skin:  No rash; warm and dry Psych:  Normal affect  Labs:  [facility] 11/30/11 1538  NA 141  K 3.8  CL 106  CO2 29  GLUCOSE 91  BUN 16  CREATININE 0.93  CALCIUM 9.4  MG 1.9  PHOS --    Basename 11/30/11 1538  AST 18  ALT 19  ALKPHOS 63  BILITOT 0.5  PROT 6.3  ALBUMIN 3.3*   No results found for this basename: LIPASE:2,AMYLASE:2 in the last 72 hours  Basename 12/01/11 0204 11/30/11 1538  WBC 6.5 6.8  NEUTROABS -- 4.0  HGB 11.1* 11.1*  HCT 33.3* 33.9*  MCV 86.0 86.5  PLT 211 242    Basename 12/01/11 0205 11/30/11 1938 11/30/11 1538  CKTOTAL 65 75 73  CKMB 2.1 2.2 2.1  TROPONINI <0.30 <0.30 <0.30   No components found with this basename: POCBNP:3 No results found for this basename: DDIMER in the last 72 hours  Basename 11/30/11 1538  HGBA1C 5.7*   No results found for this basename: CHOL,HDL,LDLCALC,TRIG,CHOLHDL in the last 72 hours  Basename 11/30/11 1538  TSH 0.728  T4TOTAL --  T3FREE --  THYROIDAB --   No results found for this basename:  VITAMINB12,FOLATE,FERRITIN,TIBC,IRON,RETICCTPCT in the last 72 hours   Lab Results  Component Value Date   INR 1.60* 12/01/2011   INR 1.73* 11/30/2011   INR 2.4 11/29/2011     Radiology/Studies:  No results found.   Assessment and Plan:  1. HYPERLIPIDEMIA Lab Results  Component Value Date   CHOL 191 07/02/2011   HDL 50.00 07/02/2011   LDLCALC 112* 07/02/2011   TRIG 146.0 07/02/2011   CHOLHDL 4 07/02/2011    Will need statin if + CAD.  2. GERD Continue PPI.  3. Atrial fibrillation S/p recent DCCV.  She remains in NSR on Flecainide.  Coumadin on hold for [facility] 12/02/11.   4. Chest pain CE's negative.  INR < 2.  Plan cath tomorrow as noted.  Orders written.   Signed, Tereso Newcomer, PA-C  9:12 AM 12/01/2011

## 2011-12-01 NOTE — Progress Notes (Signed)
ANTICOAGULATION CONSULT NOTE - Follow Up Consult  Pharmacy Consult for Heparin Indication: atrial fibrillation & chest pain  Allergies  Allergen Reactions  . Penicillins     REACTION: rash  . Sulfonamide Derivatives     REACTION: rash  . Codeine     REACTION: ? hallucinations  . Fentanyl     Chest pain, dyspnea ("she almost died")  . Acetaminophen     REACTION: hurts stomach    Patient Measurements: Height: 5\' 4"  (162.6 cm) Weight: 177 lb 0.5 oz (80.3 kg) IBW/kg (Calculated) : 54.7  Heparin Dosing Weight: 73 kg  Vital Signs: Temp: 98.3 F (36.8 C) (02/10 0428) Temp src: Oral (02/10 0428) BP: 98/48 mmHg (02/10 0428) Pulse Rate: 52  (02/10 0428)  Labs:  Basename 12/01/11 1020 12/01/11 0205 12/01/11 0204 11/30/11 1938 11/30/11 1538 11/29/11 1229  HGB -- -- 11.1* -- 11.1* --  HCT -- -- 33.3* -- 33.9* --  PLT -- -- 211 -- 242 --  APTT -- -- -- -- 39* --  LABPROT -- -- 19.3* -- 20.6* --  INR -- -- 1.60* -- 1.73* 2.4  HEPARINUNFRC 0.26* -- <0.10* -- -- --  CREATININE -- -- -- -- 0.93 --  CKTOTAL -- 65 -- 75 73 --  CKMB -- 2.1 -- 2.2 2.1 --  TROPONINI -- <0.30 -- <0.30 <0.30 --   Estimated Creatinine Clearance: 52.7 ml/min (by C-G formula based on Cr of 0.93).   Medications:  Scheduled:    . aspirin  324 mg Oral NOW   Or  . aspirin  300 mg Rectal NOW  . aspirin EC  81 mg Oral Daily  . flecainide  100 mg Oral Q12H  . metoprolol tartrate  50 mg Oral Q8H  . pantoprazole  40 mg Oral Q1200  . sertraline  25 mg Oral Daily  . sodium chloride  3 mL Intravenous Q12H    Assessment: 78yo female with chest pain, for cath tomorrow.  Heparin level approaching therapeutic at 0.26 this AM on 1200 units/hr.  Pltc is down slightly, Hg is stable.  No bleeding problems noted.  Goal of Therapy:  Heparin level 0.3-0.7 units/ml   Plan:  1.  Increase Heparin to 1350 units/hr 2.  Heparin level in 8h  Emri Sample P 12/01/2011,12:22 PM

## 2011-12-01 NOTE — Progress Notes (Signed)
ANTICOAGULATION CONSULT NOTE - Follow Up Consult  Pharmacy Consult for Heparin  Indication: Chest pain  Allergies  Allergen Reactions  . Penicillins     REACTION: rash  . Sulfonamide Derivatives     REACTION: rash  . Codeine     REACTION: ? hallucinations  . Fentanyl     Chest pain, dyspnea ("she almost died")  . Acetaminophen     REACTION: hurts stomach    Patient Measurements: Height: 5\' 4"  (162.6 cm) Weight: 177 lb 7.5 oz (80.5 kg) IBW/kg (Calculated) : 54.7  Heparin Dosing Weight: 72kg  Vital Signs: Temp: 98.2 F (36.8 C) (02/09 2237) Temp src: Oral (02/09 2237) BP: 113/56 mmHg (02/09 2237) Pulse Rate: 53  (02/09 2237)  Labs:  Basename 12/01/11 0204 11/30/11 1938 11/30/11 1538 11/29/11 1229  HGB 11.1* -- 11.1* --  HCT 33.3* -- 33.9* --  PLT 211 -- 242 --  APTT -- -- 39* --  LABPROT 19.3* -- 20.6* --  INR 1.60* -- 1.73* 2.4  HEPARINUNFRC <0.10* -- -- --  CREATININE -- -- 0.93 --  CKTOTAL -- 75 73 --  CKMB -- 2.2 2.1 --  TROPONINI -- <0.30 <0.30 --   Estimated Creatinine Clearance: 52.8 ml/min (by C-G formula based on Cr of 0.93).  Assessment: 78 yo female with h/o Afib, Coumadin on hold for cath Monday, for Heparin.  Goal of Therapy:  Heparin level 0.3-0.7 units/ml   Plan:  Increase Heparin 1200 units/hr Check heparin level in 8 hours.    Feliciana Narayan, Gary Fleet 12/01/2011,3:15 AM

## 2011-12-01 NOTE — Progress Notes (Signed)
ANTICOAGULATION CONSULT NOTE - Follow Up Consult  Pharmacy Consult for Heparin Indication: Afib & ACS sx  Allergies  Allergen Reactions  . Penicillins     REACTION: rash  . Sulfonamide Derivatives     REACTION: rash  . Codeine     REACTION: ? hallucinations  . Fentanyl     Chest pain, dyspnea ("she almost died")  . Acetaminophen     REACTION: hurts stomach    Patient Measurements: Height: 5\' 4"  (162.6 cm) Weight: 177 lb 0.5 oz (80.3 kg) IBW/kg (Calculated) : 54.7  Heparin Dosing Weight: 73 kg  Vital Signs: Temp: 97.1 F (36.2 C) (02/10 1500) Temp src: Oral (02/10 1500) BP: 113/57 mmHg (02/10 1500) Pulse Rate: 58  (02/10 1500)  Labs:  Basename 12/01/11 2039 12/01/11 1020 12/01/11 0205 12/01/11 0204 11/30/11 1938 11/30/11 1538 11/29/11 1229  HGB -- -- -- 11.1* -- 11.1* --  HCT -- -- -- 33.3* -- 33.9* --  PLT -- -- -- 211 -- 242 --  APTT -- -- -- -- -- 39* --  LABPROT -- -- -- 19.3* -- 20.6* --  INR -- -- -- 1.60* -- 1.73* 2.4  HEPARINUNFRC 0.32 0.26* -- <0.10* -- -- --  CREATININE -- -- -- -- -- 0.93 --  CKTOTAL -- -- 65 -- 75 73 --  CKMB -- -- 2.1 -- 2.2 2.1 --  TROPONINI -- -- <0.30 -- <0.30 <0.30 --   Estimated Creatinine Clearance: 52.7 ml/min (by C-G formula based on Cr of 0.93).   Medications:  Scheduled:     . aspirin  324 mg Oral NOW   Or  . aspirin  300 mg Rectal NOW  . aspirin  324 mg Oral Pre-Cath  . aspirin EC  81 mg Oral Daily  . flecainide  100 mg Oral Q12H  . metoprolol tartrate  50 mg Oral Q8H  . pantoprazole  40 mg Oral Q1200  . sertraline  25 mg Oral Daily  . sodium chloride  3 mL Intravenous Q12H  . DISCONTD: aspirin EC  81 mg Oral Daily  . DISCONTD: sodium chloride  3 mL Intravenous Q12H    Assessment: 78 y.o. F on heparin for Afib and ACS sx while awaiting cath on 2/11 with a therapeutic heparin level this evening (HL 0.32, goal of 0.3-0.7) although only reflective of ~3 hrs of rate change. Hgb/Hct/Plt stable. No s/sx of bleeding  noted per nurse report. Level will likely still climb up however will wait to recheck in the morning.    Goal of Therapy:  Heparin level 0.3-0.7 units/ml   Plan:  1.  Continue heparin at current drip rate of 1350 units/hr (13.5 ml/hr) 2. Will continue to monitor for any signs/symptoms of bleeding and will follow up with heparin level in the a.m.   Georgina Pillion, PharmD, BCPS Clinical Pharmacist Pager: [phone] 12/01/2011 9:32 PM

## 2011-12-02 ENCOUNTER — Encounter (HOSPITAL_COMMUNITY)
Admission: AD | Disposition: A | Discharge status: Home / Self Care | Payer: Self-pay | Source: Ambulatory Visit | Attending: Cardiology

## 2011-12-02 ENCOUNTER — Encounter (HOSPITAL_COMMUNITY): Payer: Self-pay | Admitting: Internal Medicine

## 2011-12-02 ENCOUNTER — Other Ambulatory Visit: Payer: Self-pay

## 2011-12-02 ENCOUNTER — Ambulatory Visit (HOSPITAL_COMMUNITY): Admit: 2011-12-02 | Payer: Self-pay | Admitting: Internal Medicine

## 2011-12-02 DIAGNOSIS — I2510 Atherosclerotic heart disease of native coronary artery without angina pectoris: Secondary | ICD-10-CM

## 2011-12-02 HISTORY — PX: LEFT AND RIGHT HEART CATHETERIZATION WITH CORONARY ANGIOGRAM: SHX5449

## 2011-12-02 LAB — CBC
MCH: 28.7 pg (ref 26.0–34.0)
Platelets: 240 10*3/uL (ref 150–400)
RBC: 4.15 MIL/uL (ref 3.87–5.11)
WBC: 6.8 10*3/uL (ref 4.0–10.5)

## 2011-12-02 SURGERY — LEFT AND RIGHT HEART CATHETERIZATION WITH CORONARY ANGIOGRAM
Anesthesia: LOCAL

## 2011-12-02 MED ORDER — ONDANSETRON HCL 4 MG/2ML IJ SOLN: 4.0000 mg | Freq: Four times a day (QID) | INTRAMUSCULAR | Status: DC | PRN

## 2011-12-02 MED ORDER — LIDOCAINE HCL (PF) 1 % IJ SOLN
INTRAMUSCULAR | Status: AC
  Filled 2011-12-02: qty 30

## 2011-12-02 MED ORDER — ACETAMINOPHEN 325 MG PO TABS
650.0000 mg | ORAL_TABLET | ORAL | Status: DC | PRN
  Administered 2011-12-02: 650 mg via ORAL
  Filled 2011-12-02: qty 2

## 2011-12-02 MED ORDER — WARFARIN SODIUM 5 MG PO TABS
5.0000 mg | ORAL_TABLET | Freq: Once | ORAL | Status: AC
  Administered 2011-12-02: 5 mg via ORAL
  Filled 2011-12-02: qty 1

## 2011-12-02 MED ORDER — SODIUM CHLORIDE 0.9 % IV SOLN
1.0000 mL/kg/h | INTRAVENOUS | Status: AC
  Administered 2011-12-02: 1 mL/kg/h via INTRAVENOUS

## 2011-12-02 MED ORDER — MIDAZOLAM HCL 2 MG/2ML IJ SOLN
INTRAMUSCULAR | Status: AC
  Filled 2011-12-02: qty 2

## 2011-12-02 MED ORDER — NITROGLYCERIN 0.2 MG/ML ON CALL CATH LAB
INTRAVENOUS | Status: AC
  Filled 2011-12-02: qty 1

## 2011-12-02 MED ORDER — HEPARIN (PORCINE) IN NACL 2-0.9 UNIT/ML-% IJ SOLN
INTRAMUSCULAR | Status: AC
  Filled 2011-12-02: qty 2000

## 2011-12-02 MED ORDER — HEPARIN SOD (PORCINE) IN D5W 100 UNIT/ML IV SOLN
1500.0000 [IU]/h | INTRAVENOUS | Status: DC
  Administered 2011-12-02: 1350 [IU]/h via INTRAVENOUS
  Filled 2011-12-02 (×2): qty 250

## 2011-12-02 NOTE — Interval H&P Note (Signed)
History and Physical Interval Note:  12/02/2011 7:42 AM  Bridget Lane  has presented today for surgery, with the diagnosis of Chest pain  The various methods of treatment have been discussed with the patient and family. After consideration of risks, benefits and other options for treatment, the patient has consented to  Procedure(s): LEFT AND RIGHT HEART CATHETERIZATION WITH CORONARY ANGIOGRAM as a surgical intervention .  The patients' history has been reviewed, patient examined, no change in status, stable for surgery.  I have reviewed the patients' chart and labs.  Questions were answered to the patient's satisfaction.     Daniel Bensimhon

## 2011-12-02 NOTE — H&P (View-Only) (Signed)
Progress Note  Name:  Bridget Lane  Date:  12/01/2011 9:12 AM    Subjective:  No chest pain or dyspnea.  Objective:   Filed Vitals:   11/30/11 1239 11/30/11 1500 11/30/11 2237 12/01/11 0428  BP: 102/53 98/58 113/56 98/48  Pulse: 53 62 53 52  Temp: 97 F (36.1 C) 98 F (36.7 C) 98.2 F (36.8 C) 98.3 F (36.8 C)  TempSrc: Oral Oral Oral Oral  Resp: 18 18 18 18  Height: 5' 4" (1.626 m)     Weight: 177 lb 7.5 oz (80.5 kg)   177 lb 0.5 oz (80.3 kg)  SpO2: 96% 96% 96% 95%    Intake/Output Summary (Last 24 hours) at 12/01/11 0912 Last data filed at 12/01/11 0335  Gross per 24 hour  Intake    252 ml  Output    300 ml  Net    -48 ml    Tele:  Sinus brady, HR 50s  PHYSICAL EXAM General: no acute distress Neck:  No JVD Lungs: Clear to auscultation bilaterally  Heart: normal S1, S2;  Regular rate and rhythm, no murmur Abdomen: soft, nontender Extremities: no clubbing, cyanosis or edema Skin:  No rash; warm and dry Psych:  Normal affect  Labs:  Basename 11/30/11 1538  NA 141  K 3.8  CL 106  CO2 29  GLUCOSE 91  BUN 16  CREATININE 0.93  CALCIUM 9.4  MG 1.9  PHOS --    Basename 11/30/11 1538  AST 18  ALT 19  ALKPHOS 63  BILITOT 0.5  PROT 6.3  ALBUMIN 3.3*   No results found for this basename: LIPASE:2,AMYLASE:2 in the last 72 hours  Basename 12/01/11 0204 11/30/11 1538  WBC 6.5 6.8  NEUTROABS -- 4.0  HGB 11.1* 11.1*  HCT 33.3* 33.9*  MCV 86.0 86.5  PLT 211 242    Basename 12/01/11 0205 11/30/11 1938 11/30/11 1538  CKTOTAL 65 75 73  CKMB 2.1 2.2 2.1  TROPONINI <0.30 <0.30 <0.30   No components found with this basename: POCBNP:3 No results found for this basename: DDIMER in the last 72 hours  Basename 11/30/11 1538  HGBA1C 5.7*   No results found for this basename: CHOL,HDL,LDLCALC,TRIG,CHOLHDL in the last 72 hours  Basename 11/30/11 1538  TSH 0.728  T4TOTAL --  T3FREE --  THYROIDAB --   No results found for this basename:  VITAMINB12,FOLATE,FERRITIN,TIBC,IRON,RETICCTPCT in the last 72 hours   Lab Results  Component Value Date   INR 1.60* 12/01/2011   INR 1.73* 11/30/2011   INR 2.4 11/29/2011     Radiology/Studies:  No results found.   Assessment and Plan:  1. HYPERLIPIDEMIA Lab Results  Component Value Date   CHOL 191 07/02/2011   HDL 50.00 07/02/2011   LDLCALC 112* 07/02/2011   TRIG 146.0 07/02/2011   CHOLHDL 4 07/02/2011    Will need statin if + CAD.  2. GERD Continue PPI.  3. Atrial fibrillation S/p recent DCCV.  She remains in NSR on Flecainide.  Coumadin on hold for [facility] 12/02/11.   4. Chest pain CE's negative.  INR < 2.  Plan cath tomorrow as noted.  Orders written.   Signed, Montreal Steidle, PA-C  9:12 AM 12/01/2011    

## 2011-12-02 NOTE — Progress Notes (Signed)
Pt ambulated 250 ft with no complaints.  Right groin remains level zero.  Will inform assigned RN when returns from lunch.

## 2011-12-02 NOTE — Progress Notes (Signed)
ANTICOAGULATION CONSULT NOTE - Follow Up Consult  Pharmacy Consult for heparin + coumadin Indication: atrial fibrillation  Allergies  Allergen Reactions  . Penicillins     REACTION: rash  . Sulfonamide Derivatives     REACTION: rash  . Codeine     REACTION: ? hallucinations  . Fentanyl     Chest pain, dyspnea ("she almost died")  . Acetaminophen     REACTION: hurts stomach    Patient Measurements: Height: 5\' 4"  (162.6 cm) Weight: 177 lb 0.5 oz (80.3 kg) IBW/kg (Calculated) : 54.7   Vital Signs: Temp: 96.7 F (35.9 C) (02/11 0619) Temp src: Oral (02/11 0619) BP: 114/56 mmHg (02/11 0950) Pulse Rate: 50  (02/11 0950)  Labs:  Basename 12/02/11 0630 12/01/11 2039 12/01/11 1020 12/01/11 0205 12/01/11 0204 11/30/11 1938 11/30/11 1538 11/29/11 1229  HGB 11.9* -- -- -- 11.1* -- -- --  HCT 35.9* -- -- -- 33.3* -- 33.9* --  PLT 240 -- -- -- 211 -- 242 --  APTT -- -- -- -- -- -- 39* --  LABPROT -- -- -- -- 19.3* -- 20.6* --  INR -- -- -- -- 1.60* -- 1.73* 2.4  HEPARINUNFRC 0.42 0.32 0.26* -- -- -- -- --  CREATININE -- -- -- -- -- -- 0.93 --  CKTOTAL -- -- -- 65 -- 75 73 --  CKMB -- -- -- 2.1 -- 2.2 2.1 --  TROPONINI -- -- -- <0.30 -- <0.30 <0.30 --   Estimated Creatinine Clearance: 52.7 ml/min (by C-G formula based on Cr of 0.93).   Medications:  Prescriptions prior to admission  Medication Sig Dispense Refill  . acetaminophen (TYLENOL) 325 MG tablet Take 650 mg by mouth 2 (two) times daily.       . Calcium Carbonate (CALTRATE 600) 1500 MG TABS Take 1 tablet by mouth daily.        . flecainide (TAMBOCOR) 100 MG tablet Take 1 tablet (100 mg total) by mouth 2 (two) times daily.  60 tablet  1  . Glucosamine-Chondroit-Vit C-Mn (GLUCOSAMINE 1500 COMPLEX PO) Take 1 tablet by mouth daily.       . Melatonin 3 MG TABS Take 3 mg by mouth at bedtime.       . metoprolol tartrate (LOPRESSOR) 25 MG tablet Take 75 mg by mouth 2 (two) times daily.      . Multiple Vitamins-Minerals  (OCUVITE PO) Take 2 tablets by mouth daily.       Marland Kitchen omeprazole (PRILOSEC) 40 MG capsule Take 40 mg by mouth 2 (two) times daily.      . Probiotic Product (PROBIOTIC PO) Take 1 tablet by mouth daily.       . sertraline (ZOLOFT) 25 MG tablet Take 25 mg by mouth daily.       Marland Kitchen warfarin (COUMADIN) 2.5 MG tablet Take 2.5-5 mg by mouth daily. Takes 1 tablet every day except she takes 2 tablets on Saturday.        Assessment: 78 yof on coumadin PTA for atrial fibrillation. It was held for a cardiac cath which was performed earlier today. MD reported essentially normal cath without findings to explain exertional dyspnea and CP. She has been on heparin IV while coumadin was held. Heparin level this AM was 0.42 on her heparin gtt at a rate of 1350units/hr. PTA she took coumadin 2.5mg  daily except 5mg  on Saturdays. Heparin to resume 8 hour post cath and coumadin also to resume tonight.   Goal of Therapy:  INR 2-3 Heparin  level 0.3-0.5   Plan:  1. Restart heparin at previously therapeutic rate of 1350units/hr at 1630 tonight (no bolus) 2. Coumadin 5mg  PO x 1 tonight 3. Daily INR 4. Daily heparin level 5. 8 hour heparin level (~0030 2/12)  Kalsey Lull, Drake Leach 12/02/2011,11:06 AM

## 2011-12-02 NOTE — Op Note (Signed)
Cardiac Cath Procedure Note  Indication: Chest pain & SOB  Procedures performed:  1) Right heart cathererization 2) Selective coronary angiography 3) Left heart catheterization 4) Left ventriculogram  Description of procedure:     The risks and indication of the procedure were explained. Consent was signed and placed on the chart. An appropriate timeout was taken prior to the procedure. The right groin was prepped and draped in the routine sterile fashion and anesthetized with 1% local lidocaine.   A 5 FR arterial sheath was placed in the right femoral artery using a modified Seldinger technique. Standard catheters including a JL4, JR4 and angled pigtail were used. All catheter exchanges were made over a wire. A 7 FR venous sheath was placed in the right femoral vein using a modified Seldinger technique. A standard Swan-Ganz catheter was used for the procedure.   Complications:  None apparent  Findings:  RA =  4 RV = 29/6/8 PA = 28/7 (16) PCW = 7  Fick cardiac output/index = 5.2/2.8 PVR = 1.7 Woods FA sat = 96% PA sat = 67%, 61%, 70%  Ao Pressure: 119/51 (77) LV Pressure:  120/0/8 There was no signficant gradient across the aortic valve on pullback.  Left main: Normal  LAD: 30-40% prox followed by 30%. 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 Minimal plaque proximally. Otherwise normal.  LV-gram done in the RAO projection: Ejection fraction = 55%. No wall motion abnormalities/  Assessment: 1. Mild CAD 2. Normal LV function 3. Normal right sided pressures 4. Myocardial bridging section with no evidence of flow limitation  Plan/Discussion:  Essentially normal cath. No findings to explain exertional dyspnea and CP. Will review echo. If symptoms persist will follow-up with PFTs +/- chest CT.  Arvilla Meres, MD 8:16 AM

## 2011-12-02 NOTE — Progress Notes (Signed)
UR Completed. Simmons, Buna Cuppett F [phone]  

## 2011-12-03 LAB — POCT I-STAT 3, VENOUS BLOOD GAS (G3P V)
Acid-base deficit: 4 mmol/L — ABNORMAL HIGH (ref 0.0–2.0)
Bicarbonate: 23.1 mEq/L (ref 20.0–24.0)
O2 Saturation: 67 %
TCO2: 24 mmol/L (ref 0–100)
pCO2, Ven: 39 mmHg — ABNORMAL LOW (ref 45.0–50.0)
pCO2, Ven: 43 mmHg — ABNORMAL LOW (ref 45.0–50.0)
pH, Ven: 7.338 — ABNORMAL HIGH (ref 7.250–7.300)
pH, Ven: 7.35 — ABNORMAL HIGH (ref 7.250–7.300)
pH, Ven: 7.357 — ABNORMAL HIGH (ref 7.250–7.300)
pO2, Ven: 34 mmHg (ref 30.0–45.0)
pO2, Ven: 36 mmHg (ref 30.0–45.0)

## 2011-12-03 LAB — POCT I-STAT 3, ART BLOOD GAS (G3+)
Acid-base deficit: 1 mmol/L (ref 0.0–2.0)
Bicarbonate: 23.6 mEq/L (ref 20.0–24.0)
O2 Saturation: 96 %
TCO2: 25 mmol/L (ref 0–100)
pH, Arterial: 7.389 (ref 7.350–7.400)

## 2011-12-03 LAB — CBC
HCT: 37 % (ref 36.0–46.0)
MCHC: 33 g/dL (ref 30.0–36.0)
MCV: 86 fL (ref 78.0–100.0)
Platelets: 227 10*3/uL (ref 150–400)
RDW: 14.1 % (ref 11.5–15.5)
WBC: 6.5 10*3/uL (ref 4.0–10.5)

## 2011-12-03 LAB — HEPARIN LEVEL (UNFRACTIONATED): Heparin Unfractionated: 0.2 IU/mL — ABNORMAL LOW (ref 0.30–0.70)

## 2011-12-03 MED ORDER — DABIGATRAN ETEXILATE MESYLATE 150 MG PO CAPS
150.0000 mg | ORAL_CAPSULE | Freq: Two times a day (BID) | ORAL | Status: DC
  Administered 2011-12-03: 150 mg via ORAL
  Filled 2011-12-03 (×4): qty 1

## 2011-12-03 MED ORDER — DABIGATRAN ETEXILATE MESYLATE 150 MG PO CAPS: 150.0000 mg | ORAL_CAPSULE | Freq: Two times a day (BID) | ORAL | Status: DC

## 2011-12-03 NOTE — Progress Notes (Signed)
SUBJECTIVE: The patient is doing well today.  At this time, she denies chest pain, shortness of breath, or any new concerns.     Marland Kitchen aspirin EC  81 mg Oral Daily  . dabigatran  150 mg Oral Q12H  . flecainide  100 mg Oral Q12H  . metoprolol tartrate  50 mg Oral Q8H  . pantoprazole  40 mg Oral Q1200  . sertraline  25 mg Oral Daily  . sodium chloride  3 mL Intravenous Q12H  . warfarin  5 mg Oral ONCE-1800      . sodium chloride Stopped (12/02/11 1335)  . DISCONTD: sodium chloride 1,000 mL (12/02/11 0426)  . DISCONTD: heparin 1,350 Units/hr (12/01/11 1746)  . DISCONTD: heparin 1,500 Units/hr (12/03/11 0636)    OBJECTIVE: Physical Exam: Filed Vitals:   12/02/11 1451 12/02/11 2139 12/03/11 0434 12/03/11 0500  BP:  121/58 102/49   Pulse: 60 56 54   Temp:  97.5 F (36.4 C) 97.6 F (36.4 C)   TempSrc:  Oral Oral   Resp:  18 19   Height:      Weight:    173 lb 3.2 oz (78.563 kg)  SpO2:  95% 94%     Intake/Output Summary (Last 24 hours) at 12/03/11 0816 Last data filed at 12/03/11 0500  Gross per 24 hour  Intake 1528.78 ml  Output    500 ml  Net 1028.78 ml    Telemetry reveals sinus rhythm  GEN- The patient is well appearing, alert and oriented x 3 today.   Head- normocephalic, atraumatic Eyes-  Sclera clear, conjunctiva pink Ears- hearing intact Oropharynx- clear Neck- supple, no JVP Lymph- no cervical lymphadenopathy Lungs- Clear to ausculation bilaterally, normal work of breathing Heart- Regular rate and rhythm, no murmurs, rubs or gallops, PMI not laterally displaced GI- soft, NT, ND, + BS Extremities- no clubbing, cyanosis, or edema,  No hematoma or bruit  LABS: Basic Metabolic Panel:  Basename 11/30/11 1538  NA 141  K 3.8  CL 106  CO2 29  GLUCOSE 91  BUN 16  CREATININE 0.93  CALCIUM 9.4  MG 1.9  PHOS --   Liver Function Tests:  Basename 11/30/11 1538  AST 18  ALT 19  ALKPHOS 63  BILITOT 0.5  PROT 6.3  ALBUMIN 3.3*   No results found for  this basename: LIPASE:2,AMYLASE:2 in the last 72 hours CBC:  Basename 12/03/11 0517 12/02/11 0630 11/30/11 1538  WBC 6.5 6.8 --  NEUTROABS -- -- 4.0  HGB 12.2 11.9* --  HCT 37.0 35.9* --  MCV 86.0 86.5 --  PLT 227 240 --   Cardiac Enzymes:  Basename 12/01/11 0205 11/30/11 1938 11/30/11 1538  CKTOTAL 65 75 73  CKMB 2.1 2.2 2.1  CKMBINDEX -- -- --  TROPONINI <0.30 <0.30 <0.30   BNP: No components found with this basename: POCBNP:3 D-Dimer: No results found for this basename: DDIMER:2 in the last 72 hours Hemoglobin A1C:  Basename 11/30/11 1538  HGBA1C 5.7*   Fasting Lipid Panel: No results found for this basename: CHOL,HDL,LDLCALC,TRIG,CHOLHDL,LDLDIRECT in the last 72 hours Thyroid Function Tests:  Basename 11/30/11 1538  TSH 0.728  T4TOTAL --  T3FREE --  THYROIDAB --   Anemia Panel: No results found for this basename: VITAMINB12,FOLATE,FERRITIN,TIBC,IRON,RETICCTPCT in the last 72 hours  RADIOLOGY: No results found.  ASSESSMENT AND PLAN:   1.  Afib- maintaining sinus rhythm Continue flecainide Start pradaxa 150mg  BID, stop heparin gtt after first dose of pradaxa is given. Stop ASA and coumadin  2. SOB- improved Cath reviewed She will need an echo in our office  Follow-up with Dr Riley Kill in 2 weeks.  DC to home today.   [name], MD 12/03/2011 8:16 AM

## 2011-12-03 NOTE — Discharge Summary (Signed)
CARDIOLOGY DISCHARGE SUMMARY   Patient ID: Bridget Lane MRN: [id number] DOB/AGE: 1933/10/10 78 y.o.  Admit date: 11/30/2011 Discharge date: 12/03/2011  Primary Discharge Diagnosis:  Exertional chest pain Secondary Discharge Diagnosis:  Patient Active Problem List  Diagnoses  . NONTOXIC MULTINODULAR GOITER  . HYPERLIPIDEMIA  . DEPRESSION  . ESOPHAGEAL STRICTURE  . GERD  . OSTEOARTHRITIS  . PERIUMBILICAL PAIN  . HELICOBACTER PYLORI INFECTION, HX OF  . COLONIC POLYPS, HX OF  . Herpes zoster  . Neuralgia of chest  . Postherpetic neuralgia at T3-T5 level  . Withdrawal symptoms, drug or narcotic  . Atrial fibrillation  . Chest pain  . Encounter for long-term (current) use of anticoagulants  . Viral URI with cough    Procedures: 1) Right heart catheterization  2) Selective coronary angiography  3) Left heart catheterization  4) Left ventriculogram   Hospital Course: Bridget Lane is a 78 year old female with a history of atrial fibrillation. She had previously been started on flecainide and cardioverted to sinus rhythm. She mentioned that she was having exertional chest tightness plus shortness of breath that was consistent and seemed to progressive. She had been on Coumadin and her INR was therapeutic. Her Coumadin was held and she was scheduled for admission.  Her INR was allowed to drift down and by 11/30/2011 it was subtherapeutic at 1.73. She was admitted and started on heparin. A 12/02/2011 her INR was low enough for cardiac catheterization. Because of the shortness of breath as well as chest tightness she had a right and left heart catheterization. Cath Assessment:  1. Mild CAD  2. Normal LV function  3. Normal right sided pressures  4. Myocardial bridging section with no evidence of flow limitation   Dr. Jerrell Mylar review the films and felt there were no findings to explain exertional dyspnea and chest pain. An echocardiogram was recommended,and if her symptoms  continue,consideration can be given to pulmonary function testing and possibly a chest CT.   Post cath she ambulated with the nursing staff in her groin remained stable. Her oxygen saturation was limits and she did not complain of shortness of breath. On 12/03/2011, she was seen by Dr. Johney Frame. She was ambulating well and was not having any chest pain or shortness of breath with ambulation. He changed her from Coumadin to dabigatrin. Because she was recently cardioverted, the heparin will be continued until the dabigatrin is actually given. Once the dabigatrin is given, the heparin can be discontinued. She can then be safely discharged home, to followup as an outpatient.    Labs: Lab Results  Component Value Date   WBC 6.5 12/03/2011   HGB 12.2 12/03/2011   HCT 37.0 12/03/2011   MCV 86.0 12/03/2011   PLT 227 12/03/2011    Lab 11/30/11 1538  NA 141  K 3.8  CL 106  CO2 29  BUN 16  CREATININE 0.93  CALCIUM 9.4  PROT 6.3  BILITOT 0.5  ALKPHOS 63  ALT 19  AST 18  GLUCOSE 91    Basename 12/01/11 0205 11/30/11 1938 11/30/11 1538  CKTOTAL 65 75 73  CKMB 2.1 2.2 2.1  CKMBINDEX -- -- --  TROPONINI <0.30 <0.30 <0.30   Lipid Panel     Component Value Date/Time   CHOL 191 07/02/2011 1033   TRIG 146.0 07/02/2011 1033   HDL 50.00 07/02/2011 1033   CHOLHDL 4 07/02/2011 1033   VLDL 29.2 07/02/2011 1033   LDLCALC 112* 07/02/2011 1033    No results  found for this basename: probnp    Basename 12/03/11 0517  INR 1.08   Radiology: No results found.  EKG: 12/02/2011 Sinus bradycardia with 1st degree A-V block, rate 52 Inferior infarct , age undetermined Abnormal ECG  FOLLOW UP PLANS AND APPOINTMENTS Discharge Orders    Future Appointments: Provider: Department: Dept Phone: Center:   12/05/2011 8:30 PM [facility] [place] [phone] [facility]   12/09/2011 10:05 AM Raul Del, RN [facility] [phone] None   12/25/2011 2:00 PM Beatrice Lecher, PA [facility] [place]  [phone] [facility]   12/30/2011 10:15 AM Raul Del, RN [facility] [phone] None   12/30/2011 10:30 AM Gardiner Rhyme, MD [facility] [phone] [facility]     Allergies  Allergen Reactions  . Penicillins     REACTION: rash  . Sulfonamide Derivatives     REACTION: rash  . Codeine     REACTION: ? hallucinations  . Fentanyl     Chest pain, dyspnea ("she almost died")  . Acetaminophen     REACTION: hurts stomach   Medication List  As of 12/03/2011  9:30 AM   STOP taking these medications         warfarin 2.5 MG tablet         TAKE these medications         acetaminophen 325 MG tablet   Commonly known as: TYLENOL   Take 650 mg by mouth 2 (two) times daily.      CALTRATE 600 1500 MG Tabs   Generic drug: Calcium Carbonate   Take 1 tablet by mouth daily.      dabigatran 150 MG Caps   Commonly known as: PRADAXA   Take 1 capsule (150 mg total) by mouth every 12 (twelve) hours.      flecainide 100 MG tablet   Commonly known as: TAMBOCOR   Take 1 tablet (100 mg total) by mouth 2 (two) times daily.      GLUCOSAMINE 1500 COMPLEX PO   Take 1 tablet by mouth daily.      Melatonin 3 MG Tabs   Take 3 mg by mouth at bedtime.      metoprolol tartrate 25 MG tablet   Commonly known as: LOPRESSOR   Take 75 mg by mouth 2 (two) times daily.      OCUVITE PO   Take 2 tablets by mouth daily.      omeprazole 40 MG capsule   Commonly known as: PRILOSEC   Take 40 mg by mouth 2 (two) times daily.      PROBIOTIC PO   Take 1 tablet by mouth daily.      sertraline 25 MG tablet   Commonly known as: ZOLOFT   Take 25 mg by mouth daily.           Follow-up Information       Follow up with Tereso Newcomer, PA. (March 6th, 2 pm for Dr Riley Kill)    Contact information:   [address] [place] [address] [phone]          BRING ALL MEDICATIONS WITH YOU TO FOLLOW UP APPOINTMENTS  Time spent with patient to include  physician time: 41 min Signed: Theodore Demark 12/03/2011, 9:30 AM Co-Sign MD  I have seen, examined the patient, and reviewed the above assessment and plan.   Co Sign: [name], MD 12/03/2011 7:38 PM

## 2011-12-03 NOTE — Progress Notes (Addendum)
ANTICOAGULATION CONSULT NOTE - Follow Up Consult  Pharmacy Consult for heparin  Indication: atrial fibrillation  Allergies  Allergen Reactions  . Penicillins     REACTION: rash  . Sulfonamide Derivatives     REACTION: rash  . Codeine     REACTION: ? hallucinations  . Fentanyl     Chest pain, dyspnea ("she almost died")  . Acetaminophen     REACTION: hurts stomach    Patient Measurements: Height: 5\' 4"  (162.6 cm) Weight: 173 lb 3.2 oz (78.563 kg) IBW/kg (Calculated) : 54.7   Vital Signs: Temp: 97.6 F (36.4 C) (02/12 0434) Temp src: Oral (02/12 0434) BP: 102/49 mmHg (02/12 0434) Pulse Rate: 54  (02/12 0434)  Labs:  Alvira Philips 12/03/11 0517 12/03/11 0006 12/02/11 0630 12/01/11 0205 12/01/11 0204 11/30/11 1938 11/30/11 1538  HGB 12.2 -- 11.9* -- -- -- --  HCT 37.0 -- 35.9* -- 33.3* -- --  PLT 227 -- 240 -- 211 -- --  APTT -- -- -- -- -- -- 39*  LABPROT 14.2 -- -- -- 19.3* -- 20.6*  INR 1.08 -- -- -- 1.60* -- 1.73*  HEPARINUNFRC 0.20* 0.26* 0.42 -- -- -- --  CREATININE -- -- -- -- -- -- 0.93  CKTOTAL -- -- -- 65 -- 75 73  CKMB -- -- -- 2.1 -- 2.2 2.1  TROPONINI -- -- -- <0.30 -- <0.30 <0.30   Estimated Creatinine Clearance: 52.2 ml/min (by C-G formula based on Cr of 0.93).   M  Assessment: 78 yo female with h/o Afib, s/p cath, for Heparin  Level drawn approx 6 hrs after infusion restarted.  Goal of Therapy:  INR 2-3 Heparin level 0.3-0.5   Plan:  Expect level to continue to increase into range (previously therapeutic at this rate) with more time after restarting, so will continue Heparin at current rate and follow-up am labs.      Addendum:  Am Level 0.2 Did not increase as expected.  Increase Heparin 1500 units/hr.  Check heparin level in 6 hours.   Geannie Risen, PharmD, BCPS 12/03/2011 6:14 AM

## 2011-12-03 NOTE — Discharge Instructions (Signed)
2-D Echocardiogram - February 20th at 10:30  NO HEAVY LIFTING OR SEXUAL ACTIVITY X 7 DAYS. NO DRIVING X 2 DAYS. NO SOAKING BATHS, HOT TUBS, POOLS, ETC., X 5 DAYS.

## 2011-12-03 NOTE — Progress Notes (Signed)
Pt. Discharged 12/03/2011  11:40 AM Discharge instructions reviewed with patient/family. Patient/family verbalized understanding. All Rx's given. Questions answered as needed. Pt. Discharged to home with family/self. Taken off unit via W/C. Lesean Woolverton, [employer]

## 2011-12-05 ENCOUNTER — Ambulatory Visit (HOSPITAL_BASED_OUTPATIENT_CLINIC_OR_DEPARTMENT_OTHER): Payer: Medicare Other | Attending: Internal Medicine

## 2011-12-05 VITALS — Ht 64.0 in | Wt 172.0 lb

## 2011-12-05 DIAGNOSIS — G4733 Obstructive sleep apnea (adult) (pediatric): Secondary | ICD-10-CM

## 2011-12-05 DIAGNOSIS — G4710 Hypersomnia, unspecified: Secondary | ICD-10-CM | POA: Insufficient documentation

## 2011-12-05 DIAGNOSIS — R259 Unspecified abnormal involuntary movements: Secondary | ICD-10-CM | POA: Insufficient documentation

## 2011-12-05 DIAGNOSIS — G4730 Sleep apnea, unspecified: Secondary | ICD-10-CM | POA: Insufficient documentation

## 2011-12-09 ENCOUNTER — Encounter: Payer: Medicare Other | Admitting: *Deleted

## 2011-12-18 ENCOUNTER — Other Ambulatory Visit (HOSPITAL_COMMUNITY): Payer: Self-pay | Admitting: Cardiology

## 2011-12-19 ENCOUNTER — Ambulatory Visit (HOSPITAL_COMMUNITY): Payer: Medicare Other | Attending: Cardiology

## 2011-12-19 ENCOUNTER — Other Ambulatory Visit: Payer: Self-pay

## 2011-12-19 DIAGNOSIS — R072 Precordial pain: Secondary | ICD-10-CM

## 2011-12-19 DIAGNOSIS — R079 Chest pain, unspecified: Secondary | ICD-10-CM | POA: Insufficient documentation

## 2011-12-23 DIAGNOSIS — G4733 Obstructive sleep apnea (adult) (pediatric): Secondary | ICD-10-CM

## 2011-12-23 DIAGNOSIS — M62838 Other muscle spasm: Secondary | ICD-10-CM

## 2011-12-23 NOTE — Procedures (Signed)
NAME:  Bridget Lane, [place] NO.:  [id number]  MEDICAL RECORD NO.:  [id number]          PATIENT TYPE:  OUT  LOCATION:  SLEEP CENTER                 FACILITY:  [facility]  PHYSICIAN:  Barbaraann Share, MD,FCCPDATE OF BIRTH:  10-29-34  DATE OF STUDY:  12/05/2011                           NOCTURNAL POLYSOMNOGRAM  REFERRING PHYSICIAN:  [name], MD  REFERRING PHYSICIAN:  [name], MD  INDICATION FOR STUDY:  Hypersomnia with sleep apnea.  EPWORTH SLEEPINESS SCORE:  10.  SLEEP ARCHITECTURE:  The patient had a total sleep time of 327 minutes with no slow-wave sleep and only 34 minutes of REM.  Sleep onset latency was normal at 5.5 minutes and REM onset was prolonged at 147 minutes. Sleep efficiency was mildly reduced at 83%.  RESPIRATORY DATA:  The patient was found to have 12 apneas and 13 obstructive hypopneas, giving her an apnea/hypopnea index of only 5 events per hour.  The events occurred in all body positions and there was moderate snoring noted throughout.  The patient did not meet split night protocol secondary to her small numbers of events.  OXYGEN DATA:  There was transient O2 desaturation as low as 83% during the night.  CARDIAC DATA:  No clinically significant arrhythmias were noted.  MOVEMENT/PARASOMNIA:  The patient was found to have very large numbers of leg jerks with 7 per hour, resulting in arousal or awakening.  There were no abnormal behavior seen.  IMPRESSION/RECOMMENDATION: 1. Minimal obstructive sleep apnea/hypopnea syndrome with an AHI of 5     events per hour and O2 desaturation transiently as low as 83%.  The     patient did not meet split night criteria secondary to her very     small numbers of events.  Treatment for this should focus primarily     on modest weight loss, however, a more aggressive approach could be     considered, which may include surgery, dental appliance, or CPAP.     Clinical correlation is suggested. 2. Very  large numbers of leg jerks with significant sleep disruption.     This is very suspicious for a primary     movement disorder of sleep such as the restless legs syndrome.     This may be more of a disruption to sleep than her minimal sleep-     disordered breathing.  Again, clinical correlation is suggested.     Barbaraann Share, MD,FCCP Diplomate, American Board of Sleep Medicine    [facility]MED[facility]  D:  12/23/2011 08:30:26  T:  12/23/2011 23:40:03  Job:  [id number]

## 2011-12-25 ENCOUNTER — Encounter: Payer: Self-pay | Admitting: Physician Assistant

## 2011-12-25 ENCOUNTER — Ambulatory Visit (INDEPENDENT_AMBULATORY_CARE_PROVIDER_SITE_OTHER): Payer: Medicare Other | Admitting: Physician Assistant

## 2011-12-25 VITALS — BP 140/70 | HR 64 | Ht 64.0 in | Wt 178.0 lb

## 2011-12-25 DIAGNOSIS — R0609 Other forms of dyspnea: Secondary | ICD-10-CM

## 2011-12-25 DIAGNOSIS — R0989 Other specified symptoms and signs involving the circulatory and respiratory systems: Secondary | ICD-10-CM

## 2011-12-25 DIAGNOSIS — R0683 Snoring: Secondary | ICD-10-CM

## 2011-12-25 DIAGNOSIS — I4891 Unspecified atrial fibrillation: Secondary | ICD-10-CM

## 2011-12-25 DIAGNOSIS — I2510 Atherosclerotic heart disease of native coronary artery without angina pectoris: Secondary | ICD-10-CM | POA: Insufficient documentation

## 2011-12-25 NOTE — Patient Instructions (Signed)
Your physician recommends that you schedule a follow-up appointment as scheduled  

## 2011-12-25 NOTE — Progress Notes (Signed)
[address] [place], [place]  [address] Phone: [phone] Fax:  [phone]  Date:  12/25/2011   Name:  Bridget Lane       DOB:  06/02/1933 MRN:  [id number]  PCP:  Dr. Alwyn Ren Primary Cardiologist:  Dr.  Shawnie Pons  Primary Electrophysiologist:  Dr. [name]    History of Present Illness: Bridget Lane is a 78 y.o. female who presents for post hospital follow up.  She has a history of persistent atrial fibrillation, hyperlipidemia, GERD, DJD.  She recently underwent initiation of flecainide and cardioversion to normal sinus rhythm.  She was then seen in follow up and noted chest discomfort with exertion and shortness of breath.  She was brought into the hospital to hold her Coumadin and cover her with heparin in light of her recent cardioversion.  LHC 12/02/11: Proximal LAD 30-40%, 30%, mid long intramyocardial bridging, EF 55%, normal right heart pressures.  Dr. Johney Frame changed her from Coumadin to Pradaxa.  Followup echocardiogram was performed post discharge 12/19/11: EF 55-60%, mild LAE.  Labs: Hemoglobin 12.2, potassium 3.8, creatinine 0.93, TSH 0.728.  She is doing well.  She gets breathless with more extreme activities.  No chest pain.  No syncope, orthopnea, PND or edema.  No palpitations.  She sees Dr. [name] next week.  She is tolerating the Pradaxa.  No bleeding issues.    Past Medical History  Diagnosis Date  . OA (osteoarthritis)   . DJD (degenerative joint disease)   . Macular degeneration   . Depression   . Colon polyp   . Esophageal stricture   . Hyperlipidemia   . H. pylori infection   . GERD (gastroesophageal reflux disease)   . Nontoxic multinodular goiter   . Hiatal hernia   . Atrial fibrillation     persistent    Current Outpatient Prescriptions  Medication Sig Dispense Refill  . acetaminophen (TYLENOL) 325 MG tablet Take 650 mg by mouth 2 (two) times daily.       . Calcium Carbonate (CALTRATE 600) 1500 MG TABS  Take 1 tablet by mouth as needed.       . dabigatran (PRADAXA) 150 MG CAPS Take 1 capsule (150 mg total) by mouth every 12 (twelve) hours.  60 capsule  11  . flecainide (TAMBOCOR) 100 MG tablet Take 1 tablet (100 mg total) by mouth 2 (two) times daily.  60 tablet  1  . Glucosamine-Chondroit-Vit C-Mn (GLUCOSAMINE 1500 COMPLEX PO) Take 1 tablet by mouth daily.       . Melatonin 3 MG TABS Take 3 mg by mouth at bedtime.       . metoprolol tartrate (LOPRESSOR) 25 MG tablet Take 75 mg by mouth 2 (two) times daily.      . Multiple Vitamin (MULTIVITAMIN) tablet Take 1 tablet by mouth daily.      . Multiple Vitamins-Minerals (OCUVITE PO) Take 2 tablets by mouth daily.       Marland Kitchen omeprazole (PRILOSEC) 40 MG capsule Take 40 mg by mouth 2 (two) times daily.      . Probiotic Product (PROBIOTIC PO) Take 1 tablet by mouth daily.       . sertraline (ZOLOFT) 25 MG tablet Take 25 mg by mouth daily.         Allergies: Allergies  Allergen Reactions  . Penicillins     REACTION: rash  . Sulfonamide Derivatives     REACTION: rash  . Codeine     REACTION: ?  hallucinations  . Fentanyl     Chest pain, dyspnea ("she almost died")  . Acetaminophen     REACTION: hurts stomach    History  Substance Use Topics  . Smoking status: Never Smoker   . Smokeless tobacco: Never Used  . Alcohol Use: No     PHYSICAL EXAM: VS:  BP 140/70  Pulse 64  Ht 5\' 4"  (1.626 m)  Wt 178 lb (80.74 kg)  BMI 30.55 kg/m2 Well nourished, well developed, in no acute distress HEENT: normal Neck: no JVD Cardiac:  normal S1, S2; RRR; no murmur Lungs:  clear to auscultation bilaterally, no wheezing, rhonchi or rales Abd: soft, nontender, no hepatomegaly Ext: no edema; right groin without hematoma or bruit  Skin: warm and dry Neuro:  CNs 2-12 intact, no focal abnormalities noted  EKG:  NSR, HR 64, NSSTTW changes   ASSESSMENT AND PLAN:  1. Atrial fibrillation  Maintaining NSR.  Continue Pradaxa.  No obvious bleeding issues.   Follow up with Dr. [name] as directed.    2. Coronary Artery Disease  She has mild plaque.  We discussed indications for statin therapy.  She is not interested at this time and wants to d/w Dr.   Shawnie Pons.  No ASA as she is on Pradaxa.  Follow up with Dr.  Shawnie Pons as scheduled.    3. Snoring   I was able to locate her sleep test.  She has minimal OSA.  We discussed weight loss.  She also has a lot of leg jerks.  I asked her to follow up with her PCP to discuss whether or not to take Requip.  She is frustrated with all of the medications she takes currently, so I will let her discuss this further with her PCP.           Signed, Tereso Newcomer, PA-C  3:05 PM 12/25/2011

## 2011-12-30 ENCOUNTER — Encounter: Payer: Self-pay | Admitting: Internal Medicine

## 2011-12-30 ENCOUNTER — Encounter: Payer: Medicare Other | Admitting: *Deleted

## 2011-12-30 ENCOUNTER — Ambulatory Visit (INDEPENDENT_AMBULATORY_CARE_PROVIDER_SITE_OTHER): Payer: Medicare Other | Admitting: Internal Medicine

## 2011-12-30 DIAGNOSIS — I4891 Unspecified atrial fibrillation: Secondary | ICD-10-CM

## 2011-12-30 NOTE — Assessment & Plan Note (Signed)
Maintaining sinus rhythm with flecainide On pradaxa for stroke prevention  No changes today Follow-up with Dr Riley Kill.  I will see again in 6 months unless problems arise.

## 2011-12-30 NOTE — Patient Instructions (Addendum)
Your physician recommends that you schedule a follow-up appointment in: 6 weeks with Dr Riley Kill  Your physician wants you to follow-up in: 6 months with Dr Johney Frame Bonita Quin will receive a reminder letter in the mail two months in advance. If you don't receive a letter, please call our office to schedule the follow-up appointment.

## 2011-12-30 NOTE — Progress Notes (Signed)
PCP:  Marga Melnick, MD, MD Primary Cardiologist:  Dr Riley Kill  The patient presents today for routine electrophysiology followup.  Since last being seen in our clinic, the patient reports doing well.  She feels that she is maintaining sinus rhythm.  She denies chest pain.  Her exertional SOB is improved.  She has had fleeting perianal rash, which is resolved.  She wonders if this was from pradaxa.  She presently states that her rash is resolved.  She would like to stay on pradaxa at this time.  Past Medical History  Diagnosis Date  . OA (osteoarthritis)   . DJD (degenerative joint disease)   . Macular degeneration   . Depression   . Colon polyp   . Esophageal stricture   . Hyperlipidemia   . H. pylori infection   . GERD (gastroesophageal reflux disease)   . Nontoxic multinodular goiter   . Hiatal hernia   . Atrial fibrillation     persistent   Past Surgical History  Procedure Date  . Appendectomy   . Tubal ligation     with appendectomy  . Cholecystectomy 1989  . Total abdominal hysterectomy 1972    for pain  . Esophageal dilation     > 3 X; Dr Juanda Chance  . Colonoscopy w/ polypectomy 2005    Neg in 2010  . Cardioversion 08/29/2011    Procedure: CARDIOVERSION;  Surgeon: Shawnie Pons, MD;  Location: [facility] OR;  Service: Cardiovascular;  Laterality: N/A;  . Cardioversion 11/22/2011    Procedure: CARDIOVERSION;  Surgeon: Gardiner Rhyme, MD;  Location: [facility] OR;  Service: Cardiovascular;  Laterality: N/A;    Current Outpatient Prescriptions  Medication Sig Dispense Refill  . acetaminophen (TYLENOL) 325 MG tablet Take 650 mg by mouth 2 (two) times daily.       . Calcium Carbonate (CALTRATE 600) 1500 MG TABS Take 1 tablet by mouth as needed.       . dabigatran (PRADAXA) 150 MG CAPS Take 1 capsule (150 mg total) by mouth every 12 (twelve) hours.  60 capsule  11  . flecainide (TAMBOCOR) 100 MG tablet Take 1 tablet (100 mg total) by mouth 2 (two) times daily.  60 tablet  1  .  Glucosamine-Chondroit-Vit C-Mn (GLUCOSAMINE 1500 COMPLEX PO) Take 1 tablet by mouth daily.       . Melatonin 3 MG TABS Take 3 mg by mouth at bedtime.       . metoprolol tartrate (LOPRESSOR) 25 MG tablet Take 75 mg by mouth 2 (two) times daily.      . Multiple Vitamin (MULTIVITAMIN) tablet Take 1 tablet by mouth daily.      . Multiple Vitamins-Minerals (OCUVITE PO) Take 2 tablets by mouth daily.       Marland Kitchen omeprazole (PRILOSEC) 40 MG capsule Take 40 mg by mouth 2 (two) times daily.      . Probiotic Product (PROBIOTIC PO) Take 1 tablet by mouth daily.       . sertraline (ZOLOFT) 25 MG tablet Take 25 mg by mouth daily.         Allergies  Allergen Reactions  . Penicillins     REACTION: rash  . Sulfonamide Derivatives     REACTION: rash  . Codeine     REACTION: ? hallucinations  . Fentanyl     Chest pain, dyspnea ("she almost died")  . Acetaminophen     REACTION: hurts stomach    History   Social History  . Marital Status: Married  Spouse Name: N/A    Number of Children: N/A  . Years of Education: N/A   Occupational History  . Not on file.   Social History Main Topics  . Smoking status: Never Smoker   . Smokeless tobacco: Never Used  . Alcohol Use: No  . Drug Use: No  . Sexually Active: Not on file   Other Topics Concern  . Not on file   Social History Narrative   Pt lives in [place] with spouse.  Retired from [employer].    Family History  Problem Relation Age of Onset  . Colon cancer Father   . Osteoarthritis Father   . Coronary artery disease Father   . Heart failure Mother   . Coronary artery disease Mother   . Diabetes Mother   . Breast cancer Sister   . Diabetes Sister   . Stroke Sister   . Heart attack Sister   . Coronary artery disease Brother   . Throat cancer Brother     smoked  . Breast cancer Maternal Aunt     Physical Exam: Filed Vitals:   12/30/11 1050  BP: 122/63  Pulse: 51  Resp: 18  Height: 5\' 4"  (1.626 m)    Weight: 178 lb 12.8 oz (81.103 kg)    GEN- The patient is well appearing, alert and oriented x 3 today.   Head- normocephalic, atraumatic Eyes-  Sclera clear, conjunctiva pink Ears- hearing intact Oropharynx- clear Neck- supple, no JVP Lymph- no cervical lymphadenopathy Lungs- Clear to ausculation bilaterally, normal work of breathing Heart- Regular rate and rhythm, no murmurs, rubs or gallops, PMI not laterally displaced GI- soft, NT, ND, + BS Extremities- no clubbing, cyanosis, or edema   Assessment and Plan:

## 2012-01-07 ENCOUNTER — Other Ambulatory Visit: Payer: Self-pay | Admitting: *Deleted

## 2012-01-07 MED ORDER — FLECAINIDE ACETATE 100 MG PO TABS: 100.0000 mg | ORAL_TABLET | Freq: Two times a day (BID) | ORAL | Status: DC

## 2012-01-26 ENCOUNTER — Other Ambulatory Visit: Payer: Self-pay | Admitting: Internal Medicine

## 2012-02-03 ENCOUNTER — Ambulatory Visit (INDEPENDENT_AMBULATORY_CARE_PROVIDER_SITE_OTHER)
Admission: RE | Admit: 2012-02-03 | Discharge: 2012-02-03 | Disposition: A | Discharge status: Home / Self Care | Payer: Medicare Other | Source: Ambulatory Visit | Attending: Internal Medicine | Admitting: Internal Medicine

## 2012-02-03 ENCOUNTER — Ambulatory Visit (INDEPENDENT_AMBULATORY_CARE_PROVIDER_SITE_OTHER): Payer: Medicare Other | Admitting: Internal Medicine

## 2012-02-03 ENCOUNTER — Encounter: Payer: Self-pay | Admitting: Internal Medicine

## 2012-02-03 VITALS — BP 122/72 | HR 74 | Temp 98.2°F | Wt 181.0 lb

## 2012-02-03 DIAGNOSIS — M542 Cervicalgia: Secondary | ICD-10-CM

## 2012-02-03 DIAGNOSIS — M5412 Radiculopathy, cervical region: Secondary | ICD-10-CM

## 2012-02-03 MED ORDER — GABAPENTIN 100 MG PO CAPS: ORAL_CAPSULE | ORAL | Status: DC

## 2012-02-03 NOTE — Progress Notes (Signed)
  Subjective:    Patient ID: Bridget Lane, female    DOB: 27-Nov-1932, 78 y.o.   MRN: [id number]  HPI She has had left neck and trapezius area pain for 3 months, ever since she had cardioversion. There has been no improvement in the symptoms and the pain is essentially constant. It is variable in intensity & character. It is definitely worse with lateral rotation of the neck and when in bed in [facility]. It can also be associated with pain radiating to the left biceps area. She has weakness in her upper extremities; the weakness preceded the cardioversion.      Review of Systems She does have some numbness and tingling in her hands which is unrelated. She also has some bladder leakage but denies frank urinary or stool incontinence     Objective:   Physical Exam Gen. appearance: Well-nourished, in no distress Eyes: Extraocular motion intact, field of vision normal,  no nystagmus  Neck: decreased Llateral  range of motion, no masses, no significant  Thyroid changes Cardiovascular: Rate and rhythm normal; no  gallops or extra heart sounds.Grade 1/6 systolic murmur  Muscle skeletal:  tone, &  strength normal; significant osteoarthritic changes of the hands. Lordosis of upper thoracic spine Neuro:no cranial nerve deficit, deep tendon  reflexes normal, gait normal Lymph: No cervical or axillary LA Skin: Warm and dry without suspicious lesions or rashes Psych: no anxiety or mood change. Normally interactive and cooperative.         Assessment & Plan:  #1 neck, upper back and left upper extremity pain; probable cervical radiculopathy in the context of degenerative disc disease of the cervical spine  Plan: See orders

## 2012-02-03 NOTE — Patient Instructions (Addendum)
Order for x-rays entered into  the computer; these will be performed at [address] across from [facility]. No appointment is necessary. Please try to go on My Chart within the next 24 hours to allow me to release the results directly to you.  

## 2012-02-11 ENCOUNTER — Ambulatory Visit (INDEPENDENT_AMBULATORY_CARE_PROVIDER_SITE_OTHER): Payer: Medicare Other | Admitting: Cardiology

## 2012-02-11 ENCOUNTER — Encounter: Payer: Self-pay | Admitting: Cardiology

## 2012-02-11 VITALS — BP 130/70 | HR 53 | Ht 64.0 in | Wt 180.0 lb

## 2012-02-11 DIAGNOSIS — G4733 Obstructive sleep apnea (adult) (pediatric): Secondary | ICD-10-CM | POA: Insufficient documentation

## 2012-02-11 DIAGNOSIS — I4891 Unspecified atrial fibrillation: Secondary | ICD-10-CM

## 2012-02-11 DIAGNOSIS — I2510 Atherosclerotic heart disease of native coronary artery without angina pectoris: Secondary | ICD-10-CM

## 2012-02-11 MED ORDER — METOPROLOL TARTRATE 50 MG PO TABS: 50.0000 mg | ORAL_TABLET | Freq: Two times a day (BID) | ORAL | Status: DC

## 2012-02-11 NOTE — Assessment & Plan Note (Addendum)
Maintaining NSR.  Medications discussed.  Will decrease metoprolol to 50mg  twice daily.  Return to clinic in two months.  Will also check BMP and magnesium.

## 2012-02-11 NOTE — Patient Instructions (Addendum)
Your physician wants you to follow-up in: 2 MONTHS. You will receive a reminder letter in the mail two months in advance. If you don't receive a letter, please call our office to schedule the follow-up appointment.  Please call the office if your BP is running higher than 150/80.  Your physician has recommended you make the following change in your medication: DECREASE Metoprolol Tartrate to 50mg  take one by mouth twice a day  Your physician recommends that you return for lab work: BMP and Magnesium

## 2012-02-11 NOTE — Progress Notes (Signed)
HPI:  Patient is in for follow up.  She is doing ok for now.  She is tolerating her medications.  However she feels a little slow.  No chest pain.  We reviewed the results of her sleep study with her, but with regard to restless legs, I asked her to review with Dr. Alwyn Ren when she sees him back.  No current problems    Current Outpatient Prescriptions  Medication Sig Dispense Refill  . acetaminophen (TYLENOL) 325 MG tablet Take 650 mg by mouth 2 (two) times daily.       . Calcium Carbonate (CALTRATE 600) 1500 MG TABS Take 1 tablet by mouth as needed.       . dabigatran (PRADAXA) 150 MG CAPS Take 1 capsule (150 mg total) by mouth every 12 (twelve) hours.  60 capsule  11  . flecainide (TAMBOCOR) 100 MG tablet Take 1 tablet (100 mg total) by mouth 2 (two) times daily.  60 tablet  3  . Glucosamine-Chondroit-Vit C-Mn (GLUCOSAMINE 1500 COMPLEX PO) Take 1 tablet by mouth daily.       . Melatonin 3 MG TABS Take 3 mg by mouth at bedtime.       . metoprolol tartrate (LOPRESSOR) 25 MG tablet Take 75 mg by mouth 2 (two) times daily.      . Multiple Vitamin (MULTIVITAMIN) tablet Take 1 tablet by mouth daily.      . Multiple Vitamins-Minerals (OCUVITE PO) Take 2 tablets by mouth daily.       Marland Kitchen omeprazole (PRILOSEC) 40 MG capsule Take 40 mg by mouth 2 (two) times daily.      . Probiotic Product (PROBIOTIC FORMULA PO) Take by mouth 2 (two) times daily.      . sertraline (ZOLOFT) 25 MG tablet Take 25 mg by mouth daily.       . sertraline (ZOLOFT) 50 MG tablet TAKE 1 AND 1/2 TABLETS DAILY  45 tablet  5    Allergies  Allergen Reactions  . Penicillins     REACTION: rash  . Sulfonamide Derivatives     REACTION: rash  . Codeine     REACTION: ? hallucinations  . Fentanyl     Chest pain, dyspnea ("she almost died")  . Pradaxa      All extremities feel heavy and hurt  . Acetaminophen     REACTION: hurts stomach    Past Medical History  Diagnosis Date  . OA (osteoarthritis)   . DJD (degenerative  joint disease)   . Macular degeneration   . Depression   . Colon polyp   . Esophageal stricture   . Hyperlipidemia   . H. pylori infection   . GERD (gastroesophageal reflux disease)   . Nontoxic multinodular goiter   . Hiatal hernia   . Atrial fibrillation     persistent    Past Surgical History  Procedure Date  . Appendectomy   . Tubal ligation     with appendectomy  . Cholecystectomy 1989  . Total abdominal hysterectomy 1972    for pain  . Esophageal dilation     > 3 X; Dr Juanda Chance  . Colonoscopy w/ polypectomy 2005    Neg in 2010  . Cardioversion 08/29/2011    Procedure: CARDIOVERSION;  Surgeon: Shawnie Pons, MD;  Location: [facility] OR;  Service: Cardiovascular;  Laterality: N/A;  . Cardioversion 11/22/2011    Procedure: CARDIOVERSION;  Surgeon: Gardiner Rhyme, MD;  Location: [facility] OR;  Service: Cardiovascular;  Laterality: N/A;    Family  History  Problem Relation Age of Onset  . Colon cancer Father   . Osteoarthritis Father   . Coronary artery disease Father   . Heart failure Mother   . Coronary artery disease Mother   . Diabetes Mother   . Breast cancer Sister   . Diabetes Sister   . Stroke Sister   . Heart attack Sister   . Coronary artery disease Brother   . Throat cancer Brother     smoked  . Breast cancer Maternal Aunt     History   Social History  . Marital Status: Married    Spouse Name: N/A    Number of Children: N/A  . Years of Education: N/A   Occupational History  . Not on file.   Social History Main Topics  . Smoking status: Never Smoker   . Smokeless tobacco: Never Used  . Alcohol Use: No  . Drug Use: No  . Sexually Active: Not on file   Other Topics Concern  . Not on file   Social History Narrative   Pt lives in [place] with spouse.  Retired from [employer].    ROS: Please see the HPI.  All other systems reviewed and negative.  PHYSICAL EXAM:  BP 130/70  Pulse 53  Ht 5\' 4"  (1.626 m)  Wt 180 lb (81.647 kg)   BMI 30.90 kg/m2  General: Well developed, well nourished, in no acute distress. Head:  Normocephalic and atraumatic. Neck: no JVD Lungs: Clear to auscultation and percussion. Heart: Normal S1 and S2.  No murmur, rubs or gallops.  Pulses: Pulses normal in all 4 extremities. Extremities: No clubbing or cyanosis. No edema. Neurologic: Alert and oriented x 3.  EKG:  SB.  QTc 472.  Otherwise normal.    ASSESSMENT AND PLAN:

## 2012-02-11 NOTE — Assessment & Plan Note (Signed)
Discussed with patient.  Not crazy about CPAP which her son does.  She says she and her husband  (pt of Dr. Jesusita Oka) are trying to lose some weight.  I encouraged her in this regard.

## 2012-02-12 ENCOUNTER — Other Ambulatory Visit (INDEPENDENT_AMBULATORY_CARE_PROVIDER_SITE_OTHER): Payer: Medicare Other

## 2012-02-12 ENCOUNTER — Other Ambulatory Visit: Payer: Medicare Other

## 2012-02-12 DIAGNOSIS — G4733 Obstructive sleep apnea (adult) (pediatric): Secondary | ICD-10-CM

## 2012-02-12 DIAGNOSIS — I4891 Unspecified atrial fibrillation: Secondary | ICD-10-CM

## 2012-02-12 DIAGNOSIS — I2510 Atherosclerotic heart disease of native coronary artery without angina pectoris: Secondary | ICD-10-CM

## 2012-02-12 LAB — BASIC METABOLIC PANEL
Chloride: 105 mEq/L (ref 96–112)
GFR: 60.66 mL/min (ref >60.00)
Glucose, Bld: 86 mg/dL (ref 70–99)
Potassium: 4.1 mEq/L (ref 3.5–5.1)
Sodium: 140 mEq/L (ref 135–145)

## 2012-02-17 ENCOUNTER — Ambulatory Visit: Payer: Self-pay | Admitting: Internal Medicine

## 2012-02-17 DIAGNOSIS — Z7901 Long term (current) use of anticoagulants: Secondary | ICD-10-CM

## 2012-02-17 DIAGNOSIS — I4891 Unspecified atrial fibrillation: Secondary | ICD-10-CM

## 2012-02-23 ENCOUNTER — Other Ambulatory Visit: Payer: Self-pay | Admitting: Internal Medicine

## 2012-02-23 ENCOUNTER — Other Ambulatory Visit: Payer: Self-pay | Admitting: Cardiology

## 2012-03-13 ENCOUNTER — Telehealth: Payer: Self-pay | Admitting: Internal Medicine

## 2012-03-13 NOTE — Telephone Encounter (Signed)
Caller: Bridget Lane/Patient; PCP: Marga Melnick; CB#: [phone]; Call regarding starting with Overactive Bladder and some Leaking since taking Pradaxa 3 - 4 months ago; Worse last night- up 4-5 times with some urgency. Recently has been having episodes of legs feeling heavy and burning pain in upper arms-MD aware. Voiding clear yellow urine. Afebrile. No c/o back pain. Triage and Care advice per Urinary Sx Protocol and advised to be checked within 24 hours. No appnt avail in the office today-03/13/12. She is going to go to UC on 03/14/12 if sx not improved.

## 2012-03-21 ENCOUNTER — Other Ambulatory Visit: Payer: Self-pay | Admitting: Cardiology

## 2012-03-23 ENCOUNTER — Other Ambulatory Visit: Payer: Self-pay | Admitting: *Deleted

## 2012-03-23 DIAGNOSIS — I4891 Unspecified atrial fibrillation: Secondary | ICD-10-CM

## 2012-03-23 MED ORDER — METOPROLOL TARTRATE 50 MG PO TABS: 50.0000 mg | ORAL_TABLET | Freq: Two times a day (BID) | ORAL | Status: DC

## 2012-04-01 ENCOUNTER — Telehealth: Payer: Self-pay | Admitting: Cardiology

## 2012-04-01 NOTE — Telephone Encounter (Signed)
New Problem:    Patient called in needing to know the strength of her flecainide (TAMBOCOR) 100 MG tablet and metoprolol (LOPRESSOR) 50 MG tablet.  Please call back.

## 2012-04-01 NOTE — Telephone Encounter (Signed)
Per pt - wanted to verify her dose of Tambocor and Metoprolol.  Reviewed with her that she should be taking Tambocor 100 mg twice a day and Metoprolol 50 mg twice a day.  She states understanding but reports fatigue and SOB.  She doesn't feel like she is able to do what she should be doing and she admits to being depressed about this.  She also wanted [place] to correct her chart to reflex that she is now taking only one Zoloft and not 1 and 1/2.  Assured pt that I will adjust.  She will call back if SOB and/or fatigue worsen.

## 2012-04-13 ENCOUNTER — Encounter: Payer: Self-pay | Admitting: Cardiology

## 2012-04-13 ENCOUNTER — Ambulatory Visit (INDEPENDENT_AMBULATORY_CARE_PROVIDER_SITE_OTHER): Payer: Medicare Other | Admitting: Cardiology

## 2012-04-13 VITALS — BP 98/62 | HR 49 | Ht 64.5 in | Wt 182.0 lb

## 2012-04-13 DIAGNOSIS — I4891 Unspecified atrial fibrillation: Secondary | ICD-10-CM

## 2012-04-13 LAB — CBC WITH DIFFERENTIAL/PLATELET
Basophils Relative: 0.8 % (ref 0.0–3.0)
Eosinophils Relative: 0.7 % (ref 0.0–5.0)
HCT: 37.8 % (ref 36.0–46.0)
Hemoglobin: 12.5 g/dL (ref 12.0–15.0)
Lymphs Abs: 2.1 10*3/uL (ref 0.7–4.0)
MCHC: 33.1 g/dL (ref 30.0–36.0)
MCV: 89.3 fl (ref 78.0–100.0)
Monocytes Absolute: 0.4 10*3/uL (ref 0.1–1.0)
Neutro Abs: 4.2 10*3/uL (ref 1.4–7.7)
Neutrophils Relative %: 61.9 % (ref 43.0–77.0)
RBC: 4.24 Mil/uL (ref 3.87–5.11)
WBC: 6.8 10*3/uL (ref 4.5–10.5)

## 2012-04-13 MED ORDER — METOPROLOL TARTRATE 25 MG PO TABS: 25.0000 mg | ORAL_TABLET | Freq: Two times a day (BID) | ORAL | Status: DC

## 2012-04-13 NOTE — Assessment & Plan Note (Signed)
There is no clinical recurrence of the present time. As such, we will cut down her metoprolol because of her bradycardia and feeling of some what fatigue. She is also on per DACs and there is some question of blood in her stool. Because of this we will get CBC and also check stool cards we will see her back within a month to make appropriate adjustments. Her blood pressure was measured lower in the clinic today, but typically has been about 130/70. As such, she will keep an eye on this, and we will make adjustments as appropriate

## 2012-04-13 NOTE — Progress Notes (Signed)
HPI:  The patient says she feels both good and bad.  She does not however have specific complaints. Importantly, she has noticed some green stool, and an old white and noticed a little bit of blood. She said was only minor. Of note, the patient does note that she has some hemorrhoidal bleeding from time to time. She's not had any significant chest pain.  Current Outpatient Prescriptions  Medication Sig Dispense Refill  . acetaminophen (TYLENOL) 325 MG tablet Take 650 mg by mouth 2 (two) times daily.       . Calcium Carbonate (CALTRATE 600) 1500 MG TABS Take 1 tablet by mouth as needed.       . dabigatran (PRADAXA) 150 MG CAPS Take 1 capsule (150 mg total) by mouth every 12 (twelve) hours.  60 capsule  11  . flecainide (TAMBOCOR) 100 MG tablet Take 1 tablet (100 mg total) by mouth 2 (two) times daily.  60 tablet  3  . Glucosamine-Chondroit-Vit C-Mn (GLUCOSAMINE 1500 COMPLEX PO) Take 1 tablet by mouth daily.       . Melatonin 3 MG TABS Take 3 mg by mouth at bedtime.       . metoprolol (LOPRESSOR) 25 MG tablet Take 1 tablet (25 mg total) by mouth 2 (two) times daily.  60 tablet  6  . Multiple Vitamin (MULTIVITAMIN) tablet Take 1 tablet by mouth daily.      . Multiple Vitamins-Minerals (OCUVITE PO) Take 2 tablets by mouth daily.       Marland Kitchen omeprazole (PRILOSEC) 40 MG capsule TAKE ONE CAPSULE BY MOUTH TWICE A DAY  60 capsule  3  . Probiotic Product (PROBIOTIC FORMULA PO) Take by mouth 2 (two) times daily.      Marland Kitchen DISCONTD: metoprolol (LOPRESSOR) 50 MG tablet Take 1 tablet (50 mg total) by mouth 2 (two) times daily.  60 tablet  6  . DISCONTD: omeprazole (PRILOSEC) 40 MG capsule Take 40 mg by mouth 2 (two) times daily.      . sertraline (ZOLOFT) 25 MG tablet Take 25 mg by mouth daily. ( Currently Not Taking )      . DISCONTD: sertraline (ZOLOFT) 50 MG tablet         Allergies  Allergen Reactions  . Penicillins     REACTION: rash  . Sulfonamide Derivatives     REACTION: rash  . Codeine    REACTION: ? hallucinations  . Dabigatran Etexilate Mesylate      All extremities feel heavy and hurt  . Fentanyl     Chest pain, dyspnea ("she almost died")  . Acetaminophen     REACTION: hurts stomach    Past Medical History  Diagnosis Date  . OA (osteoarthritis)   . DJD (degenerative joint disease)   . Macular degeneration   . Depression   . Colon polyp   . Esophageal stricture   . Hyperlipidemia   . H. pylori infection   . GERD (gastroesophageal reflux disease)   . Nontoxic multinodular goiter   . Hiatal hernia   . Atrial fibrillation     persistent    Past Surgical History  Procedure Date  . Appendectomy   . Tubal ligation     with appendectomy  . Cholecystectomy 1989  . Total abdominal hysterectomy 1972    for pain  . Esophageal dilation     > 3 X; Dr Juanda Chance  . Colonoscopy w/ polypectomy 2005    Neg in 2010  . Cardioversion 08/29/2011    Procedure:  CARDIOVERSION;  Surgeon: Shawnie Pons, MD;  Location: [facility] OR;  Service: Cardiovascular;  Laterality: N/A;  . Cardioversion 11/22/2011    Procedure: CARDIOVERSION;  Surgeon: Gardiner Rhyme, MD;  Location: [facility] OR;  Service: Cardiovascular;  Laterality: N/A;    Family History  Problem Relation Age of Onset  . Colon cancer Father   . Osteoarthritis Father   . Coronary artery disease Father   . Heart failure Mother   . Coronary artery disease Mother   . Diabetes Mother   . Breast cancer Sister   . Diabetes Sister   . Stroke Sister   . Heart attack Sister   . Coronary artery disease Brother   . Throat cancer Brother     smoked  . Breast cancer Maternal Aunt     History   Social History  . Marital Status: Married    Spouse Name: N/A    Number of Children: N/A  . Years of Education: N/A   Occupational History  . Not on file.   Social History Main Topics  . Smoking status: Never Smoker   . Smokeless tobacco: Never Used  . Alcohol Use: No  . Drug Use: No  . Sexually Active: Not on file   Other Topics  Concern  . Not on file   Social History Narrative   Pt lives in [place] with spouse.  Retired from [employer].    ROS: Please see the HPI.  All other systems reviewed and negative.  PHYSICAL EXAM:  BP 98/62  Pulse 49  Ht 5' 4.5" (1.638 m)  Wt 182 lb (82.555 kg)  BMI 30.76 kg/m2  General: Well developed, well nourished, in no acute distress. Head:  Normocephalic and atraumatic. Neck: no JVD Lungs: Clear to auscultation and percussion. Heart: Normal S1 and S2.  No murmur, rubs or gallops.  Pulses: Pulses normal in all 4 extremities. Extremities: No clubbing or cyanosis. No edema. Neurologic: Alert and oriented x 3.  EKG:  SB with first degree av block.  PR 222.  No acute changs.    ASSESSMENT AND PLAN:

## 2012-04-13 NOTE — Patient Instructions (Addendum)
Your physician has recommended you make the following change in your medication: DECREASE Metoprolol Tartrate to 25mg  take one by mouth twice a day  Your physician recommends that you have lab work today: CBC and Stool Cards  Your physician has requested that you regularly monitor and record your blood pressure readings at home. Please use the same machine at the same time of day to check your readings and record them to bring to your follow-up visit.  Your physician recommends that you schedule a follow-up appointment in: 2-3 WEEKS with Dr Riley Kill or Tereso Newcomer PA-C

## 2012-04-24 ENCOUNTER — Other Ambulatory Visit: Payer: Medicare Other

## 2012-04-24 LAB — HEMOCCULT SLIDES (X 3 CARDS)
Fecal Occult Blood: NEGATIVE
OCCULT 2: NEGATIVE
OCCULT 5: NEGATIVE

## 2012-05-03 ENCOUNTER — Other Ambulatory Visit: Payer: Self-pay | Admitting: Internal Medicine

## 2012-05-04 NOTE — Telephone Encounter (Signed)
Please verify she's requesting refill; if that is the case it can be refilled as #30 ; 1 every 8 hours as needed

## 2012-05-04 NOTE — Telephone Encounter (Signed)
Dr.Hopper please advise, medication was previously on patient's med list, not currently.

## 2012-05-05 NOTE — Telephone Encounter (Signed)
Spoke with patient, yes she is requesting

## 2012-05-08 ENCOUNTER — Ambulatory Visit (INDEPENDENT_AMBULATORY_CARE_PROVIDER_SITE_OTHER): Payer: Medicare Other | Admitting: Cardiology

## 2012-05-08 ENCOUNTER — Encounter: Payer: Self-pay | Admitting: Cardiology

## 2012-05-08 VITALS — BP 140/70 | HR 65 | Resp 18 | Ht 64.0 in | Wt 183.0 lb

## 2012-05-08 DIAGNOSIS — R079 Chest pain, unspecified: Secondary | ICD-10-CM

## 2012-05-08 DIAGNOSIS — G4733 Obstructive sleep apnea (adult) (pediatric): Secondary | ICD-10-CM

## 2012-05-08 DIAGNOSIS — I4891 Unspecified atrial fibrillation: Secondary | ICD-10-CM

## 2012-05-08 NOTE — Patient Instructions (Addendum)
Your physician recommends that you schedule a follow-up appointment in: 4 MONTHS  Your physician recommends that you continue on your current medications as directed. Please refer to the Current Medication list given to you today.   

## 2012-05-08 NOTE — Assessment & Plan Note (Signed)
No clinical recurrence.  Continue antithrombin treatment.  Also on Flecainide.

## 2012-05-08 NOTE — Progress Notes (Signed)
HPI:  Patient is in for follow up. Overall she is doing reasonably well she has had a fair amount of neck discomfort and we discussed whether this could be related to her medications. She denies any current chest pain. She does not think that she is been out of rhythm.  Current Outpatient Prescriptions  Medication Sig Dispense Refill  . acetaminophen (TYLENOL) 325 MG tablet Take 650 mg by mouth 2 (two) times daily.       . Calcium Carbonate (CALTRATE 600) 1500 MG TABS Take 1 tablet by mouth as needed.       . dabigatran (PRADAXA) 150 MG CAPS Take 1 capsule (150 mg total) by mouth every 12 (twelve) hours.  60 capsule  11  . flecainide (TAMBOCOR) 100 MG tablet Take 1 tablet (100 mg total) by mouth 2 (two) times daily.  60 tablet  3  . gabapentin (NEURONTIN) 100 MG capsule TAKE ONE CAPSULE BY MOUTH EVERY 8 HOURS AS NEEDED  30 capsule  0  . Glucosamine-Chondroit-Vit C-Mn (GLUCOSAMINE 1500 COMPLEX PO) Take 1 tablet by mouth daily.       . Melatonin 3 MG TABS Take 3 mg by mouth at bedtime.       . metoprolol (LOPRESSOR) 25 MG tablet Take 1 tablet (25 mg total) by mouth 2 (two) times daily.  60 tablet  6  . Multiple Vitamin (MULTIVITAMIN) tablet Take 1 tablet by mouth daily.      . Multiple Vitamins-Minerals (OCUVITE PO) Take 2 tablets by mouth daily.       Marland Kitchen omeprazole (PRILOSEC) 40 MG capsule TAKE ONE CAPSULE BY MOUTH TWICE A DAY  60 capsule  3  . Probiotic Product (PROBIOTIC FORMULA PO) Take by mouth 2 (two) times daily.      . sertraline (ZOLOFT) 25 MG tablet Take 25 mg by mouth daily. ( Currently Not Taking )        Allergies  Allergen Reactions  . Penicillins     REACTION: rash  . Sulfonamide Derivatives     REACTION: rash  . Codeine     REACTION: ? hallucinations  . Dabigatran Etexilate Mesylate      All extremities feel heavy and hurt  . Fentanyl     Chest pain, dyspnea ("she almost died")  . Acetaminophen     REACTION: hurts stomach    Past Medical History  Diagnosis Date    . OA (osteoarthritis)   . DJD (degenerative joint disease)   . Macular degeneration   . Depression   . Colon polyp   . Esophageal stricture   . Hyperlipidemia   . H. pylori infection   . GERD (gastroesophageal reflux disease)   . Nontoxic multinodular goiter   . Hiatal hernia   . Atrial fibrillation     persistent    Past Surgical History  Procedure Date  . Appendectomy   . Tubal ligation     with appendectomy  . Cholecystectomy 1989  . Total abdominal hysterectomy 1972    for pain  . Esophageal dilation     > 3 X; Dr Juanda Chance  . Colonoscopy w/ polypectomy 2005    Neg in 2010  . Cardioversion 08/29/2011    Procedure: CARDIOVERSION;  Surgeon: Shawnie Pons, MD;  Location: [facility] OR;  Service: Cardiovascular;  Laterality: N/A;  . Cardioversion 11/22/2011    Procedure: CARDIOVERSION;  Surgeon: Gardiner Rhyme, MD;  Location: [facility] OR;  Service: Cardiovascular;  Laterality: N/A;    Family History  Problem  Relation Age of Onset  . Colon cancer Father   . Osteoarthritis Father   . Coronary artery disease Father   . Heart failure Mother   . Coronary artery disease Mother   . Diabetes Mother   . Breast cancer Sister   . Diabetes Sister   . Stroke Sister   . Heart attack Sister   . Coronary artery disease Brother   . Throat cancer Brother     smoked  . Breast cancer Maternal Aunt     History   Social History  . Marital Status: Married    Spouse Name: N/A    Number of Children: N/A  . Years of Education: N/A   Occupational History  . Not on file.   Social History Main Topics  . Smoking status: Never Smoker   . Smokeless tobacco: Never Used  . Alcohol Use: No  . Drug Use: No  . Sexually Active: Not on file   Other Topics Concern  . Not on file   Social History Narrative   Pt lives in [place] with spouse.  Retired from [employer].    ROS: Please see the HPI.  All other systems reviewed and negative.  PHYSICAL EXAM:  BP 140/70  Pulse 65   Resp 18  Ht 5\' 4"  (1.626 m)  Wt 183 lb (83.008 kg)  BMI 31.41 kg/m2  SpO2 97%  General: Well developed, well nourished, in no acute distress. Head:  Normocephalic and atraumatic. Neck: no JVD Lungs: Clear to auscultation and percussion. Heart: Normal S1 and S2.  No murmur, rubs or gallops.  Pulses: Pulses normal in all 4 extremities. Extremities: No clubbing or cyanosis. No edema. Neurologic: Alert and oriented x 3.  EKG: NSR.  Appears in the range of   ASSESSMENT AND PLAN:

## 2012-05-13 ENCOUNTER — Other Ambulatory Visit: Payer: Self-pay | Admitting: Cardiology

## 2012-05-24 NOTE — Assessment & Plan Note (Signed)
No significant CAD at time of cath.  Tolerating meds for atrial fibrillation

## 2012-05-24 NOTE — Assessment & Plan Note (Signed)
Currently no specific treatment.

## 2012-05-26 ENCOUNTER — Telehealth: Payer: Self-pay | Admitting: Internal Medicine

## 2012-05-26 NOTE — Telephone Encounter (Signed)
Discuss with patient, appt scheduled. 

## 2012-05-26 NOTE — Telephone Encounter (Signed)
Specialists & insurance organizations  require an updated, current  assessment and written note from the Primary Care physician  to review before they  schedule an appointment to assess symptoms or problems. If we do not have such  a current  assessment of your health issue or complaint in the chart (electronic medical record);you will need to  make an appointment to create this document THEY REQUIRE. It will be necessary to update status since 4/13 visit in note in reference to symptoms , treatments , and response to these interventions. Please bring that medical history & all medications & supplements to that appointment so I can complete the required document.

## 2012-05-26 NOTE — Telephone Encounter (Signed)
Pt states she is still having the neck pain and is ready to pursue other options. She would like an MRI or referral to specialist. Whichever Dr. Alwyn Ren prefers.

## 2012-05-29 ENCOUNTER — Encounter: Payer: Self-pay | Admitting: Internal Medicine

## 2012-05-29 ENCOUNTER — Ambulatory Visit (INDEPENDENT_AMBULATORY_CARE_PROVIDER_SITE_OTHER): Payer: Medicare Other | Admitting: Internal Medicine

## 2012-05-29 VITALS — BP 118/74 | HR 54 | Temp 97.9°F | Wt 182.2 lb

## 2012-05-29 DIAGNOSIS — M5030 Other cervical disc degeneration, unspecified cervical region: Secondary | ICD-10-CM

## 2012-05-29 DIAGNOSIS — M5412 Radiculopathy, cervical region: Secondary | ICD-10-CM

## 2012-05-29 NOTE — Progress Notes (Signed)
  Subjective:    Patient ID: Bridget Lane, female    DOB: [birth year], 78 y.o.   MRN: [id number]  HPI She continues to have pain in the neck, left trapezius, and left upper extremity. The history recorded 02/03/12 was reviewed. It is essentially unchanged except the pain has increased from a level III, level VIII. The pain is worse at night when supine, especially in the left lateral decubitus position. She has decreased range of motion of her neck as well as her left upper extremity. With elevation of the left upper extremity she has pain in the medial aspect of the upper arm & shoulder. She denies any past history of injury or surgery to the neck. She questions whether  the symptoms began after her cardioversion in Nov 2012.  Cervical spine films revealed degenerative disc disease at C5-6 and C6-7 with some foraminal changes. The changes are most striking at C5-6 Review of Systems She denies fever, chills, sweats, or unexplained weight loss. She's had no associated rash. She still has no urinary or stool incontinence.  She admits to being concerned about malignancy. Her sister had similar symptoms prior to her death from throat cancer. Her sister was a multi-decade, multi pack per day smoker       Objective:   Physical Exam She appears well-nourished in no acute distress  There is no lymphadenopathy about the neck or axilla  There is decreased lateral rotation of the neck to the left with pain.  She has striking lordosis of the thoracic spine.  There is decreased range of motion  of the left upper extremity with pain, especially with posterior rotation and elevation.  She has striking degenerative joint changes of the hands.  Strength, tone, and deep tendon reflexes are normal  No suspicious skin lesions or rashes are present        Assessment & Plan:  #1 cervical degenerative disc disease; radiographically this is worse at C5-6  Plan: MRI of the cervical spine as a prelude to  possible neurosurgical consultation

## 2012-05-30 ENCOUNTER — Ambulatory Visit (HOSPITAL_BASED_OUTPATIENT_CLINIC_OR_DEPARTMENT_OTHER)
Admission: RE | Admit: 2012-05-30 | Discharge: 2012-05-30 | Disposition: A | Discharge status: Home / Self Care | Payer: Medicare Other | Source: Ambulatory Visit | Attending: Internal Medicine | Admitting: Internal Medicine

## 2012-05-30 ENCOUNTER — Other Ambulatory Visit (HOSPITAL_BASED_OUTPATIENT_CLINIC_OR_DEPARTMENT_OTHER): Payer: Medicare Other

## 2012-05-30 DIAGNOSIS — M79609 Pain in unspecified limb: Secondary | ICD-10-CM | POA: Insufficient documentation

## 2012-05-30 DIAGNOSIS — M25519 Pain in unspecified shoulder: Secondary | ICD-10-CM | POA: Insufficient documentation

## 2012-05-30 DIAGNOSIS — M5412 Radiculopathy, cervical region: Secondary | ICD-10-CM

## 2012-05-30 DIAGNOSIS — M542 Cervicalgia: Secondary | ICD-10-CM | POA: Insufficient documentation

## 2012-05-30 DIAGNOSIS — M47812 Spondylosis without myelopathy or radiculopathy, cervical region: Secondary | ICD-10-CM | POA: Insufficient documentation

## 2012-05-30 DIAGNOSIS — M5030 Other cervical disc degeneration, unspecified cervical region: Secondary | ICD-10-CM | POA: Insufficient documentation

## 2012-05-31 ENCOUNTER — Other Ambulatory Visit: Payer: Self-pay | Admitting: Internal Medicine

## 2012-05-31 DIAGNOSIS — M5412 Radiculopathy, cervical region: Secondary | ICD-10-CM

## 2012-06-02 ENCOUNTER — Other Ambulatory Visit: Payer: Self-pay | Admitting: Internal Medicine

## 2012-07-11 ENCOUNTER — Other Ambulatory Visit: Payer: Self-pay | Admitting: Internal Medicine

## 2012-07-14 ENCOUNTER — Telehealth: Payer: Self-pay | Admitting: Internal Medicine

## 2012-07-14 NOTE — Telephone Encounter (Signed)
I called patient and offered for her to be seen today by Dr.Lowne @ 3:30 or with Dr.Paz at 2:00 pm. Patient states she would rather see Dr.Hopper tomorrow.

## 2012-07-14 NOTE — Telephone Encounter (Signed)
Caller: Natika/Patient; Patient Name: Bridget Lane; PCP: Marga Melnick; Best Callback Phone Number: [phone] [name] if calling about having to urinate frequently, especially at night, and she is having some lower abdominal discomfort. She has been feeling hot for past week and a half, but does not have a fever. Triage and Care advice per Urinary Symptoms Protocol and appointment advised within 24 hours for "has one or more urninary symptomes AND has not been previously evaluated.". Appontment scheduled with Dr. Alwyn Ren at 1015 on 07/15/12.

## 2012-07-15 ENCOUNTER — Ambulatory Visit (INDEPENDENT_AMBULATORY_CARE_PROVIDER_SITE_OTHER): Payer: Medicare Other | Admitting: Internal Medicine

## 2012-07-15 ENCOUNTER — Encounter: Payer: Self-pay | Admitting: Internal Medicine

## 2012-07-15 VITALS — BP 118/80 | HR 52 | Temp 97.8°F | Wt 186.0 lb

## 2012-07-15 DIAGNOSIS — R61 Generalized hyperhidrosis: Secondary | ICD-10-CM

## 2012-07-15 DIAGNOSIS — R350 Frequency of micturition: Secondary | ICD-10-CM

## 2012-07-15 DIAGNOSIS — R351 Nocturia: Secondary | ICD-10-CM

## 2012-07-15 LAB — POCT URINALYSIS DIPSTICK
Bilirubin, UA: NEGATIVE
Blood, UA: NEGATIVE
Glucose, UA: NEGATIVE
Leukocytes, UA: NEGATIVE
Nitrite, UA: NEGATIVE
Urobilinogen, UA: 0.2

## 2012-07-15 NOTE — Patient Instructions (Addendum)
Drink  nondairy fluids up to 32 oz per day. Avoid spicy foods ; decongestants&  narcotics if possible.

## 2012-07-15 NOTE — Progress Notes (Signed)
  Subjective:    Patient ID: Bridget Lane, female    DOB: [birth year], 78 y.o.   MRN: [id number]  HPI For approximately one month she's had nocturia 2-4 times per night; there is not a significant amount of urine. She denies daytime frequency, dysuria, or pyuria. She has felt "warm inside"  without definite fever or chills.  She is not have a history of recurrent urinary tract infections.  Renal function and glucose were normal in April  For 2 years she's had night sweats;these have improved in past year but she still takes a sponge bath in the morning. She had hysterectomy for pain in 1972. Her ovaries remain. She has an upcoming appointment with Dr. Marcelle Overlie, her gynecologist   Review of Systems She's using melatonin for sleep disorder with benefit. She does have sleep apnea with a low AHI     Objective:   Physical Exam General appearance is one of good health and nourishment w/o distress.  Eyes: No conjunctival inflammation or scleral icterus is present.  Oral exam: Dental hygiene is good; lips and gums are healthy appearing.There is no oropharyngeal erythema or exudate noted.   Heart:  Normal rate and regular rhythm. S1 and S2 normal without gallop, murmur, click, rub S 4 w/o extra sounds     Lungs:Chest clear to auscultation; no wheezes, rhonchi,rales ,or rubs present.No increased work of breathing.   Abdomen: bowel sounds normal, soft and non-tender without masses, organomegaly or hernias noted.  No guarding or rebound . No flank tenderness to percussion  Skin:Warm & dry.  Intact without suspicious lesions or rashes ; no jaundice   Lymphatic: No lymphadenopathy is noted about the head, neck, axilla areas.              Assessment & Plan:  #1 nocturia without symptoms of urinary tract infection. Normal glucose.  #2 night sweats, chronic. Gynecologic followup is pending.  Plan: Urinalysis and C&S. Trial of Estrovan supplement until gynecologic appointment

## 2012-07-18 LAB — URINE CULTURE: Colony Count: 30000

## 2012-08-06 ENCOUNTER — Other Ambulatory Visit: Payer: Self-pay | Admitting: Neurosurgery

## 2012-08-06 DIAGNOSIS — M25512 Pain in left shoulder: Secondary | ICD-10-CM

## 2012-08-10 ENCOUNTER — Ambulatory Visit (INDEPENDENT_AMBULATORY_CARE_PROVIDER_SITE_OTHER): Payer: Medicare Other | Admitting: Internal Medicine

## 2012-08-10 ENCOUNTER — Encounter: Payer: Self-pay | Admitting: Internal Medicine

## 2012-08-10 VITALS — BP 126/64 | HR 62 | Ht 64.0 in | Wt 187.0 lb

## 2012-08-10 DIAGNOSIS — I4891 Unspecified atrial fibrillation: Secondary | ICD-10-CM

## 2012-08-10 NOTE — Assessment & Plan Note (Signed)
Stable No change required today  Follow-up with Dr Riley Kill as scheduled I will see again in 1 year

## 2012-08-10 NOTE — Patient Instructions (Addendum)
Your physician wants you to follow-up in: 12 months Dr Allred You will receive a reminder letter in the mail two months in advance. If you don't receive a letter, please call our office to schedule the follow-up appointment.  

## 2012-08-10 NOTE — Progress Notes (Signed)
PCP: Marga Melnick, MD Primary Cardiologist:  Dr Berneice Gandy Bridget Lane is a 78 y.o. female who presents today for routine electrophysiology followup.  Since last being seen in our clinic, the patient reports doing very well.  She is unaware of any afib since I saw her last.  She is tolerating medicines without difficulty and denies bleeding. Today, she denies symptoms of palpitations, chest pain, shortness of breath,  lower extremity edema, dizziness, presyncope, or syncope.  The patient is otherwise without complaint today.   Past Medical History  Diagnosis Date  . OA (osteoarthritis)   . DJD (degenerative joint disease)   . Macular degeneration   . Depression   . Colon polyp   . Esophageal stricture   . Hyperlipidemia   . H. pylori infection   . GERD (gastroesophageal reflux disease)   . Nontoxic multinodular goiter   . Hiatal hernia   . Atrial fibrillation     persistent   Past Surgical History  Procedure Date  . Appendectomy   . Tubal ligation     with appendectomy  . Cholecystectomy 1989  . Total abdominal hysterectomy 1972    for pain (no BSO)  . Esophageal dilation     > 3 X; Dr Juanda Chance  . Colonoscopy w/ polypectomy 2005    Neg in 2010  . Cardioversion 08/29/2011    Procedure: CARDIOVERSION;  Surgeon: Shawnie Pons, MD;  Location: [facility] OR;  Service: Cardiovascular;  Laterality: N/A;  . Cardioversion 11/22/2011    Procedure: CARDIOVERSION;  Surgeon: Gardiner Rhyme, MD;  Location: [facility] OR;  Service: Cardiovascular;  Laterality: N/A;    Current Outpatient Prescriptions  Medication Sig Dispense Refill  . acetaminophen (TYLENOL) 325 MG tablet Take 650 mg by mouth. 2 by mouth two times daily      . dabigatran (PRADAXA) 150 MG CAPS Take 1 capsule (150 mg total) by mouth every 12 (twelve) hours.  60 capsule  11  . flecainide (TAMBOCOR) 100 MG tablet TAKE 1 TABLET (100 MG TOTAL) BY MOUTH 2 (TWO) TIMES DAILY.  60 tablet  9  . Glucosamine-Chondroit-Vit C-Mn (GLUCOSAMINE 1500  COMPLEX PO) Take 1 tablet by mouth daily.       . Melatonin 3 MG TABS Take 3 mg by mouth at bedtime.       . metoprolol (LOPRESSOR) 25 MG tablet Take 1 tablet (25 mg total) by mouth 2 (two) times daily.  60 tablet  6  . Multiple Vitamin (MULTIVITAMIN) tablet Take 1 tablet by mouth daily.      Marland Kitchen omeprazole (PRILOSEC) 40 MG capsule TAKE ONE CAPSULE BY MOUTH TWICE A DAY  60 capsule  3  . Probiotic Product (PROBIOTIC FORMULA PO) Take by mouth daily.         Physical Exam: Filed Vitals:   08/10/12 1141  BP: 126/64  Pulse: 62  Height: 5\' 4"  (1.626 m)  Weight: 187 lb (84.823 kg)  SpO2: 96%    GEN- The patient is well appearing, alert and oriented x 3 today.   Head- normocephalic, atraumatic Eyes-  Sclera clear, conjunctiva pink Ears- hearing intact Oropharynx- clear Lungs- Clear to ausculation bilaterally, normal work of breathing Heart- Regular rate and rhythm, no murmurs, rubs or gallops, PMI not laterally displaced GI- soft, NT, ND, + BS Extremities- no clubbing, cyanosis, or edema  ekg today reveals sinus rhythm 62 bpm, PR 198, QRS 96, Qtc 456   Assessment and Plan:

## 2012-08-13 ENCOUNTER — Ambulatory Visit
Admission: RE | Admit: 2012-08-13 | Discharge: 2012-08-13 | Disposition: A | Discharge status: Home / Self Care | Payer: Medicare Other | Source: Ambulatory Visit | Attending: Neurosurgery | Admitting: Neurosurgery

## 2012-08-13 DIAGNOSIS — M25512 Pain in left shoulder: Secondary | ICD-10-CM

## 2012-08-24 ENCOUNTER — Telehealth: Payer: Self-pay

## 2012-08-24 NOTE — Telephone Encounter (Signed)
Pt called left message on triage line stating wanted results but didn't leave DOB. I couldn't find her in our system. I called pt back at 12:57pm and left a message to call me back because I needed more info.  Pt called back missed her call again, but left her DOB. I called pt back to advise lab results for MRI have not been resulted once they have we will call her. Pt stated understanding      MW

## 2012-08-24 NOTE — Telephone Encounter (Addendum)
I spoke with patient and informed her that she needs to follow-up with the ordering MD, Dr.Cram. Patient states she has contacted that office on several occasions and no one will give her the results of her MRI, all she is being told is that she needs to see Marlan Palau but not being told why. Patient requested that I share this information with Dr.Cram with hopes that someone will call her and give her MRI results in detail

## 2012-09-08 ENCOUNTER — Ambulatory Visit (INDEPENDENT_AMBULATORY_CARE_PROVIDER_SITE_OTHER): Payer: Medicare Other | Admitting: Cardiology

## 2012-09-08 ENCOUNTER — Encounter: Payer: Self-pay | Admitting: Cardiology

## 2012-09-08 VITALS — BP 118/65 | HR 60 | Ht 64.5 in | Wt 188.0 lb

## 2012-09-08 DIAGNOSIS — I4891 Unspecified atrial fibrillation: Secondary | ICD-10-CM

## 2012-09-08 LAB — BASIC METABOLIC PANEL
BUN: 18 mg/dL (ref 6–23)
CO2: 27 mEq/L (ref 19–32)
Calcium: 9.2 mg/dL (ref 8.4–10.5)
Chloride: 104 mEq/L (ref 96–112)
Creatinine, Ser: 1 mg/dL (ref 0.4–1.2)

## 2012-09-08 LAB — CBC WITH DIFFERENTIAL/PLATELET
Basophils Absolute: 0.1 10*3/uL (ref 0.0–0.1)
Eosinophils Absolute: 0.1 10*3/uL (ref 0.0–0.7)
Hemoglobin: 12.6 g/dL (ref 12.0–15.0)
Lymphocytes Relative: 33.3 % (ref 12.0–46.0)
MCHC: 32.8 g/dL (ref 30.0–36.0)
Neutro Abs: 3.9 10*3/uL (ref 1.4–7.7)
Platelets: 305 10*3/uL (ref 150.0–400.0)
RDW: 13.9 % (ref 11.5–14.6)

## 2012-09-08 NOTE — Progress Notes (Signed)
HPI:  The patient returns in followup. Overall she is really doing pretty well. She's not as active, and does note that she's gained quite a bit of weight. We talked about possibly changing her anticoagulant down the road, particularly in light of the fact that she has some esophageal reflux. Clinically, she seems to be doing well.  Current Outpatient Prescriptions  Medication Sig Dispense Refill  . acetaminophen (TYLENOL) 325 MG tablet Take 650 mg by mouth. 2 by mouth two times daily      . dabigatran (PRADAXA) 150 MG CAPS Take 1 capsule (150 mg total) by mouth every 12 (twelve) hours.  60 capsule  11  . flecainide (TAMBOCOR) 100 MG tablet TAKE 1 TABLET (100 MG TOTAL) BY MOUTH 2 (TWO) TIMES DAILY.  60 tablet  9  . Glucosamine-Chondroit-Vit C-Mn (GLUCOSAMINE 1500 COMPLEX PO) Take 1 tablet by mouth daily.       . Melatonin 3 MG TABS Take 3 mg by mouth at bedtime.       . metoprolol (LOPRESSOR) 25 MG tablet Take 1 tablet (25 mg total) by mouth 2 (two) times daily.  60 tablet  6  . Multiple Vitamin (MULTIVITAMIN) tablet Take 1 tablet by mouth daily.      Marland Kitchen omeprazole (PRILOSEC) 40 MG capsule TAKE ONE CAPSULE BY MOUTH TWICE A DAY  60 capsule  3  . Probiotic Product (PROBIOTIC FORMULA PO) Take by mouth daily.         Allergies  Allergen Reactions  . Penicillins     REACTION: rash  . Sulfonamide Derivatives     REACTION: rash  . Codeine     REACTION: ? hallucinations  . Dabigatran Etexilate Mesylate      All extremities feel heavy and hurt  . Fentanyl     Chest pain, dyspnea ("she almost died")  . Acetaminophen     REACTION: hurts stomach    Past Medical History  Diagnosis Date  . OA (osteoarthritis)   . DJD (degenerative joint disease)   . Macular degeneration   . Depression   . Colon polyp   . Esophageal stricture   . Hyperlipidemia   . H. pylori infection   . GERD (gastroesophageal reflux disease)   . Nontoxic multinodular goiter   . Hiatal hernia   . Atrial fibrillation      persistent    Past Surgical History  Procedure Date  . Appendectomy   . Tubal ligation     with appendectomy  . Cholecystectomy 1989  . Total abdominal hysterectomy 1972    for pain (no BSO)  . Esophageal dilation     > 3 X; Dr Juanda Chance  . Colonoscopy w/ polypectomy 2005    Neg in 2010  . Cardioversion 08/29/2011    Procedure: CARDIOVERSION;  Surgeon: Shawnie Pons, MD;  Location: [facility] OR;  Service: Cardiovascular;  Laterality: N/A;  . Cardioversion 11/22/2011    Procedure: CARDIOVERSION;  Surgeon: Gardiner Rhyme, MD;  Location: [facility] OR;  Service: Cardiovascular;  Laterality: N/A;    Family History  Problem Relation Age of Onset  . Colon cancer Father   . Osteoarthritis Father   . Coronary artery disease Father   . Heart failure Mother   . Coronary artery disease Mother   . Diabetes Mother   . Breast cancer Sister   . Diabetes Sister   . Stroke Sister   . Heart attack Sister   . Coronary artery disease Brother   . Throat cancer Brother  smoked  . Breast cancer Maternal Aunt     History   Social History  . Marital Status: Married    Spouse Name: N/A    Number of Children: N/A  . Years of Education: N/A   Occupational History  . Not on file.   Social History Main Topics  . Smoking status: Never Smoker   . Smokeless tobacco: Never Used  . Alcohol Use: No  . Drug Use: No  . Sexually Active: Not on file   Other Topics Concern  . Not on file   Social History Narrative   Pt lives in [place] with spouse.  Retired from [employer].    ROS: Please see the HPI.  All other systems reviewed and negative.  PHYSICAL EXAM:  BP 118/65  Pulse 60  Ht 5' 4.5" (1.638 m)  Wt 188 lb (85.276 kg)  BMI 31.77 kg/m2  SpO2 95%  General: Well developed, well nourished, in no acute distress. Head:  Normocephalic and atraumatic. Neck: no JVD Lungs: Clear to auscultation and percussion. Heart: Normal S1 and S2.  No murmur, rubs or gallops.  Pulses:  Pulses normal in all 4 extremities. Extremities: No clubbing or cyanosis. No edema. Neurologic: Alert and oriented x 3.  EKG:  NSR.  Normal ECG   ASSESSMENT AND PLAN:

## 2012-09-08 NOTE — Patient Instructions (Addendum)
Your physician recommends that you have lab work today: CBC, BMP and Magnesium  Your physician recommends that you schedule a follow-up appointment in: MARCH 2014  Your physician recommends that you continue on your current medications as directed. Please refer to the Current Medication list given to you today.

## 2012-09-20 NOTE — Assessment & Plan Note (Signed)
Seems to be doing pretty well with a medical regimen.  Does not have CAD and is tolerating Flecainide.  Continue meds.  May consider switch to Eliquis as she has tolerated Pradaxa, but has reflux.  Will address again at next OV.

## 2012-09-22 ENCOUNTER — Telehealth: Payer: Self-pay | Admitting: Internal Medicine

## 2012-09-22 NOTE — Telephone Encounter (Signed)
When our referral coordinator attempts to make appointments with Specialists such as an Orthopedist ; she is informed  they require an updated, current  assessment and written note from a referring physician  to review before they  schedule an appointment to assess symptoms or problems.Dr Lonie Peak records is what is needed .Please ask his office to complete the referral & forward their workup . Feel free to contact directly any of the rheumatologist in the triad to see if they'll see you  without such documentation. We do not have the data outlining your problem and work up to date in your record here.

## 2012-09-22 NOTE — Telephone Encounter (Signed)
Hopp please advise  

## 2012-09-22 NOTE — Telephone Encounter (Signed)
Patient states she would like Dr. Alwyn Ren to refer her to ortho. She states Dr. Wynetta Emery was supposed to, but she cannot get in touch with them.

## 2012-09-22 NOTE — Telephone Encounter (Signed)
Discuss with patient  

## 2012-10-08 ENCOUNTER — Other Ambulatory Visit: Payer: Self-pay | Admitting: *Deleted

## 2012-10-08 MED ORDER — OMEPRAZOLE 40 MG PO CPDR: 40.0000 mg | DELAYED_RELEASE_CAPSULE | Freq: Two times a day (BID) | ORAL | Status: DC

## 2012-10-27 ENCOUNTER — Telehealth: Payer: Self-pay | Admitting: Cardiology

## 2012-10-27 DIAGNOSIS — I4891 Unspecified atrial fibrillation: Secondary | ICD-10-CM

## 2012-10-27 NOTE — Telephone Encounter (Signed)
New problem:   Patient on praxada , was told by Dr. Riley Kill to tried a new medication. This would be monitor.

## 2012-10-27 NOTE — Telephone Encounter (Signed)
On last visit you discussed switching her pradaxa, she is ready to make the switch if you want to make a recommendation, pt aware dr/nurse not here today and will hear back later in week.

## 2012-11-02 ENCOUNTER — Other Ambulatory Visit: Payer: Self-pay | Admitting: *Deleted

## 2012-11-02 DIAGNOSIS — I4891 Unspecified atrial fibrillation: Secondary | ICD-10-CM

## 2012-11-02 MED ORDER — METOPROLOL TARTRATE 25 MG PO TABS: 25.0000 mg | ORAL_TABLET | Freq: Two times a day (BID) | ORAL | Status: DC

## 2012-11-06 ENCOUNTER — Telehealth: Payer: Self-pay | Admitting: Cardiology

## 2012-11-06 NOTE — Telephone Encounter (Signed)
Called patient and advised that Dr.Stuckey is out of the office and will not be back until next week. She will stay on Pradaxa for now until she hears back from Dr.Stuckey about medication change. She will pick up samples of Pradaxa 150mg  24 capsules today.

## 2012-11-06 NOTE — Telephone Encounter (Signed)
Pt waiting since last week re medication change, only has two pills left and needs to know what to do, also will needs rx, pls call [phone]

## 2012-11-06 NOTE — Telephone Encounter (Signed)
error 

## 2012-11-09 NOTE — Telephone Encounter (Signed)
[phone] pt calling back re medication change, running out of pradaxa, needs to know what changing to and needs refill

## 2012-11-10 NOTE — Telephone Encounter (Signed)
The pt needs baseline hepatic profile and BMP, if this okay then the pt can switch to Eliquis 5mg  bid.

## 2012-11-11 ENCOUNTER — Other Ambulatory Visit (INDEPENDENT_AMBULATORY_CARE_PROVIDER_SITE_OTHER): Payer: Medicare Other

## 2012-11-11 DIAGNOSIS — I4891 Unspecified atrial fibrillation: Secondary | ICD-10-CM

## 2012-11-11 LAB — BASIC METABOLIC PANEL
Calcium: 9.2 mg/dL (ref 8.4–10.5)
GFR: 71.75 mL/min (ref >60.00)
Glucose, Bld: 84 mg/dL (ref 70–99)
Potassium: 4.1 mEq/L (ref 3.5–5.1)
Sodium: 139 mEq/L (ref 135–145)

## 2012-11-11 LAB — HEPATIC FUNCTION PANEL
ALT: 18 U/L (ref 0–35)
AST: 19 U/L (ref 0–37)
Albumin: 3.8 g/dL (ref 3.5–5.2)
Total Bilirubin: 0.8 mg/dL (ref 0.3–1.2)
Total Protein: 7 g/dL (ref 6.0–8.3)

## 2012-11-11 NOTE — Telephone Encounter (Signed)
I spoke with the pt and she will come into the office today for lab work.  I have placed samples of Eliquis at the front desk for pick-up.  The pt is aware that she will not start this medication until our office contacts her with instructions.  The pt will also pick-up Pradaxa samples at the front desk.

## 2012-11-11 NOTE — Telephone Encounter (Signed)
New problem:   Discuss medication.   1. pradaxa refills .  2. What's the new medication she suppose to changed to

## 2012-11-17 NOTE — Telephone Encounter (Signed)
Pt aware of lab results by phone. The pt will finish her current supply of Pradaxa and then transition to Eliquis.  The pt will contact our office once she officially starts Eliquis.

## 2012-12-05 ENCOUNTER — Other Ambulatory Visit: Payer: Self-pay

## 2012-12-07 ENCOUNTER — Other Ambulatory Visit: Payer: Self-pay | Admitting: *Deleted

## 2012-12-07 ENCOUNTER — Encounter: Payer: Self-pay | Admitting: Internal Medicine

## 2012-12-07 ENCOUNTER — Telehealth: Payer: Self-pay | Admitting: Cardiology

## 2012-12-07 MED ORDER — APIXABAN 5 MG PO TABS: 5.0000 mg | ORAL_TABLET | Freq: Two times a day (BID) | ORAL | Status: DC

## 2012-12-07 NOTE — Telephone Encounter (Signed)
I called [facility] [address] [place], [place] [address]. Tel [phone] on behalf of patients Pradaxa 150mg   Capsule Rx refill which was faxed in today. I told Pharmacist patient was instructed to call the office and tell us if she wants to stay on Pradaxa or switch to Eliquist  . Until then we will hold on the RX request. Pharmacist said she will contact patient to ask her which medication she will go in for.

## 2012-12-07 NOTE — Telephone Encounter (Signed)
I spoke with the pt and she has switched from Pradaxa to Eliquis.  The pt is unsure if she would like to remain on Eliquis because she has not felt well since switching medications.  The pt has been having difficulty with GERD.  The pt noticed a week ago that she was not taking her Omeprazole correctly.  The pt was taking 40mg  twice a day, but the pharmacy changed to 20mg  and changed instructions on bottle to take two by mouth twice a day.  The pt did not notice this change and has only been taking 20mg  twice a day. The pt is taking Omeprazole correctly at this time and will finish her samples of Eliquis.  The pt will contact our office if she decides that she would like to restart Pradaxa.  I did advise the pt to make an appointment with Dr Juanda Chance for further GI evaluation. Pt agreed with plans.   I also spoke with the pt about her lab work from 11/11/12.  The pt wanted to know what GFR means.

## 2012-12-07 NOTE — Telephone Encounter (Signed)
I spoke to pharmacist from [facility] [address] [place] [address] regarding Rx refill for Pradaxa 150 mg for patient . I told her patient was instructed to call the office nad tell us if she wants to stay on pradaxa  or switch  To Eliquist as she had problem with Eliquist when on try . We are putting a hold on the Rx request for now till patient decides what she wants. Pharmacist is going to call Patient to verify which of the medications she will need.

## 2012-12-07 NOTE — Telephone Encounter (Signed)
Opened in Error.

## 2012-12-07 NOTE — Telephone Encounter (Signed)
New Problem:    Patient called in concerned about recent lab work that she had performed.  Please call back.

## 2012-12-29 ENCOUNTER — Encounter: Payer: Self-pay | Admitting: Cardiology

## 2012-12-29 ENCOUNTER — Ambulatory Visit (INDEPENDENT_AMBULATORY_CARE_PROVIDER_SITE_OTHER): Payer: Medicare Other | Admitting: Cardiology

## 2012-12-29 VITALS — BP 114/62 | HR 49 | Ht 64.5 in | Wt 188.0 lb

## 2012-12-29 DIAGNOSIS — R079 Chest pain, unspecified: Secondary | ICD-10-CM

## 2012-12-29 DIAGNOSIS — I4891 Unspecified atrial fibrillation: Secondary | ICD-10-CM

## 2012-12-29 MED ORDER — METOPROLOL TARTRATE 25 MG PO TABS: 12.5000 mg | ORAL_TABLET | Freq: Two times a day (BID) | ORAL | Status: DC

## 2012-12-29 MED ORDER — METOPROLOL TARTRATE 12.5 MG HALF TABLET: 12.5000 mg | ORAL_TABLET | Freq: Two times a day (BID) | ORAL | Status: DC

## 2012-12-29 NOTE — Patient Instructions (Addendum)
Your physician recommends that you continue on your current medications as directed. Please refer to the Current Medication list given to you today.  Your physician recommends that you schedule a follow-up appointment in: February 01, 2013

## 2012-12-29 NOTE — Progress Notes (Signed)
HPI:  This very nice patient is in for follow up. She overall is doing pretty well, although she does note swelling at nighttime. Her sleep patterns have been reasonable. She does note that her pulse is really quite slow most of the time however. She also notes that her blood pressures sometimes a little low.  No ongoing shortness of breath.    Current Outpatient Prescriptions  Medication Sig Dispense Refill  . acetaminophen (TYLENOL) 325 MG tablet Take 650 mg by mouth. 2 by mouth two times daily      . apixaban (ELIQUIS) 5 MG TABS tablet Take 1 tablet (5 mg total) by mouth 2 (two) times daily.  60 tablet  2  . flecainide (TAMBOCOR) 100 MG tablet TAKE 1 TABLET (100 MG TOTAL) BY MOUTH 2 (TWO) TIMES DAILY.  60 tablet  9  . Glucosamine-Chondroit-Vit C-Mn (GLUCOSAMINE 1500 COMPLEX PO) Take 1 tablet by mouth daily.       . Melatonin 3 MG TABS Take 3 mg by mouth at bedtime.       . metoprolol tartrate (LOPRESSOR) 25 MG tablet Take 1 tablet (25 mg total) by mouth 2 (two) times daily.  60 tablet  6  . Multiple Vitamin (MULTIVITAMIN) tablet Take 1 tablet by mouth daily.      Marland Kitchen omeprazole (PRILOSEC) 40 MG capsule Take 1 capsule (40 mg total) by mouth 2 (two) times daily.  60 capsule  2  . Probiotic Product (PROBIOTIC FORMULA PO) Take by mouth daily.        No current facility-administered medications for this visit.    Allergies  Allergen Reactions  . Penicillins     REACTION: rash  . Sulfonamide Derivatives     REACTION: rash  . Codeine     REACTION: ? hallucinations  . Dabigatran Etexilate Mesylate      All extremities feel heavy and hurt  . Fentanyl     Chest pain, dyspnea ("she almost died")  . Acetaminophen     REACTION: hurts stomach    Past Medical History  Diagnosis Date  . OA (osteoarthritis)   . DJD (degenerative joint disease)   . Macular degeneration   . Depression   . Colon polyp   . Esophageal stricture   . Hyperlipidemia   . H. pylori infection   . GERD  (gastroesophageal reflux disease)   . Nontoxic multinodular goiter   . Hiatal hernia   . Atrial fibrillation     persistent    Past Surgical History  Procedure Laterality Date  . Appendectomy    . Tubal ligation      with appendectomy  . Cholecystectomy  1989  . Total abdominal hysterectomy  1972    for pain (no BSO)  . Esophageal dilation      > 3 X; Dr Juanda Chance  . Colonoscopy w/ polypectomy  2005    Neg in 2010  . Cardioversion  08/29/2011    Procedure: CARDIOVERSION;  Surgeon: Shawnie Pons, MD;  Location: [facility] OR;  Service: Cardiovascular;  Laterality: N/A;  . Cardioversion  11/22/2011    Procedure: CARDIOVERSION;  Surgeon: Gardiner Rhyme, MD;  Location: [facility] OR;  Service: Cardiovascular;  Laterality: N/A;    Family History  Problem Relation Age of Onset  . Colon cancer Father   . Osteoarthritis Father   . Coronary artery disease Father   . Heart failure Mother   . Coronary artery disease Mother   . Diabetes Mother   . Breast cancer Sister   .  Diabetes Sister   . Stroke Sister   . Heart attack Sister   . Coronary artery disease Brother   . Throat cancer Brother     smoked  . Breast cancer Maternal Aunt     History   Social History  . Marital Status: Married    Spouse Name: N/A    Number of Children: N/A  . Years of Education: N/A   Occupational History  . Not on file.   Social History Main Topics  . Smoking status: Never Smoker   . Smokeless tobacco: Never Used  . Alcohol Use: No  . Drug Use: No  . Sexually Active: Not on file   Other Topics Concern  . Not on file   Social History Narrative   Pt lives in [place] with spouse.  Retired from [employer].    ROS: Please see the HPI.  All other systems reviewed and negative.  PHYSICAL EXAM:  BP 114/62  Pulse 49  Ht 5' 4.5" (1.638 m)  Wt 188 lb (85.276 kg)  BMI 31.78 kg/m2  SpO2 95%  General: Well developed, well nourished, in no acute distress. Head:  Normocephalic and  atraumatic. Neck: no JVD Lungs: Clear to auscultation and percussion. Heart: Normal S1 and S2.  No murmur, rubs or gallops.  Pulses: Pulses normal in all 4 extremities. Extremities: No clubbing or cyanosis. No edema. Neurologic: Alert and oriented x 3.  EKG:  SB.. First degree av block.  Borderline QTc.    ASSESSMENT AND PLAN:

## 2012-12-29 NOTE — Assessment & Plan Note (Signed)
The patient's had no recurrence. She is a bit bradycardic, and with that we will cut back her metoprolol to 12.5 mg twice daily. I will see her back in mid April to reassess her blood pressure and pulse. We will finalize her treatment regimen at that point in time, and then make arrangements for her transitional care.

## 2013-01-02 NOTE — Assessment & Plan Note (Signed)
Patient had cath with Dr. Gala Romney.  Findings in overview.

## 2013-02-01 ENCOUNTER — Ambulatory Visit (INDEPENDENT_AMBULATORY_CARE_PROVIDER_SITE_OTHER): Payer: Medicare Other | Admitting: Cardiology

## 2013-02-01 ENCOUNTER — Encounter: Payer: Self-pay | Admitting: Cardiology

## 2013-02-01 VITALS — BP 104/66 | HR 53 | Ht 64.0 in | Wt 187.0 lb

## 2013-02-01 DIAGNOSIS — G4733 Obstructive sleep apnea (adult) (pediatric): Secondary | ICD-10-CM

## 2013-02-01 DIAGNOSIS — I4891 Unspecified atrial fibrillation: Secondary | ICD-10-CM

## 2013-02-01 NOTE — Progress Notes (Signed)
HPI:  The patient is in for follow up.  She is about the same.  No new changes.  Still sweats a bit at night.     Current Outpatient Prescriptions  Medication Sig Dispense Refill  . acetaminophen (TYLENOL) 325 MG tablet Take 650 mg by mouth. 2 by mouth two times daily      . apixaban (ELIQUIS) 5 MG TABS tablet Take 1 tablet (5 mg total) by mouth 2 (two) times daily.  60 tablet  2  . flecainide (TAMBOCOR) 100 MG tablet TAKE 1 TABLET (100 MG TOTAL) BY MOUTH 2 (TWO) TIMES DAILY.  60 tablet  9  . Glucosamine-Chondroit-Vit C-Mn (GLUCOSAMINE 1500 COMPLEX PO) Take 1 tablet by mouth daily.       . Melatonin 3 MG TABS Take 3 mg by mouth at bedtime.       . metoprolol tartrate (LOPRESSOR) 12.5 mg TABS Take 0.5 tablets (12.5 mg total) by mouth 2 (two) times daily.  30 tablet  6  . Multiple Vitamin (MULTIVITAMIN) tablet Take 1 tablet by mouth daily.      Marland Kitchen omeprazole (PRILOSEC) 40 MG capsule Take 1 capsule (40 mg total) by mouth 2 (two) times daily.  60 capsule  2  . Probiotic Product (PROBIOTIC FORMULA PO) Take by mouth daily.        No current facility-administered medications for this visit.    Allergies  Allergen Reactions  . Penicillins     REACTION: rash  . Sulfonamide Derivatives     REACTION: rash  . Codeine     REACTION: ? hallucinations  . Dabigatran Etexilate Mesylate      All extremities feel heavy and hurt  . Fentanyl     Chest pain, dyspnea ("she almost died")  . Acetaminophen     REACTION: hurts stomach    Past Medical History  Diagnosis Date  . OA (osteoarthritis)   . DJD (degenerative joint disease)   . Macular degeneration   . Depression   . Colon polyp   . Esophageal stricture   . Hyperlipidemia   . H. pylori infection   . GERD (gastroesophageal reflux disease)   . Nontoxic multinodular goiter   . Hiatal hernia   . Atrial fibrillation     persistent    Past Surgical History  Procedure Laterality Date  . Appendectomy    . Tubal ligation      with  appendectomy  . Cholecystectomy  1989  . Total abdominal hysterectomy  1972    for pain (no BSO)  . Esophageal dilation      > 3 X; Dr Juanda Chance  . Colonoscopy w/ polypectomy  2005    Neg in 2010  . Cardioversion  08/29/2011    Procedure: CARDIOVERSION;  Surgeon: Shawnie Pons, MD;  Location: [facility] OR;  Service: Cardiovascular;  Laterality: N/A;  . Cardioversion  11/22/2011    Procedure: CARDIOVERSION;  Surgeon: Gardiner Rhyme, MD;  Location: [facility] OR;  Service: Cardiovascular;  Laterality: N/A;    Family History  Problem Relation Age of Onset  . Colon cancer Father   . Osteoarthritis Father   . Coronary artery disease Father   . Heart failure Mother   . Coronary artery disease Mother   . Diabetes Mother   . Breast cancer Sister   . Diabetes Sister   . Stroke Sister   . Heart attack Sister   . Coronary artery disease Brother   . Throat cancer Brother     smoked  .  Breast cancer Maternal Aunt     History   Social History  . Marital Status: Married    Spouse Name: N/A    Number of Children: N/A  . Years of Education: N/A   Occupational History  . Not on file.   Social History Main Topics  . Smoking status: Never Smoker   . Smokeless tobacco: Never Used  . Alcohol Use: No  . Drug Use: No  . Sexually Active: Not on file   Other Topics Concern  . Not on file   Social History Narrative   Pt lives in [place] with spouse.  Retired from [employer].    ROS: Please see the HPI.  All other systems reviewed and negative.  PHYSICAL EXAM:  BP 104/66  Pulse 53  Ht 5\' 4"  (1.626 m)  Wt 187 lb (84.823 kg)  BMI 32.08 kg/m2  SpO2 99%  General: Well developed, well nourished, in no acute distress. Head:  Normocephalic and atraumatic. Neck: no JVD Lungs: Clear to auscultation and percussion. Heart: Normal S1 and S2.  No murmur, rubs or gallops.  Pulses: Pulses normal in all 4 extremities. Extremities: No clubbing or cyanosis. No edema. Neurologic: Alert  and oriented x 3.  EKG:  SB with first degree AV block.  Voltage for LVH.    ASSESSMENT AND PLAN:  1.  Taper beta blockade over two days dt bradycardia 2.  RTC two months Dr. Shirlee Latch.  3.  Check Flecainide level.

## 2013-02-01 NOTE — Patient Instructions (Addendum)
Your physician recommends that you schedule a follow-up appointment in:  2 months with Dr. Shirlee Latch  Your physician has recommended you make the following change in your medication:  Decrease metoprolol to half tablet by mouth daily for 2 days and then stop.

## 2013-02-06 NOTE — Assessment & Plan Note (Signed)
Under treatment with Dr. Shelle Iron.

## 2013-02-06 NOTE — Assessment & Plan Note (Signed)
Currently on Flecainide with well controlled. Last cath by DB showed normal RH pressures.  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  Has maintained NSR.

## 2013-03-02 ENCOUNTER — Other Ambulatory Visit: Payer: Self-pay | Admitting: *Deleted

## 2013-03-02 MED ORDER — FLECAINIDE ACETATE 100 MG PO TABS: 100.0000 mg | ORAL_TABLET | Freq: Two times a day (BID) | ORAL | Status: DC

## 2013-03-22 ENCOUNTER — Other Ambulatory Visit: Payer: Self-pay | Admitting: Internal Medicine

## 2013-03-23 ENCOUNTER — Other Ambulatory Visit: Payer: Self-pay

## 2013-03-23 MED ORDER — APIXABAN 5 MG PO TABS: 5.0000 mg | ORAL_TABLET | Freq: Two times a day (BID) | ORAL | Status: DC

## 2013-03-25 ENCOUNTER — Other Ambulatory Visit: Payer: Self-pay | Admitting: *Deleted

## 2013-03-25 MED ORDER — FLECAINIDE ACETATE 100 MG PO TABS: 100.0000 mg | ORAL_TABLET | Freq: Two times a day (BID) | ORAL | Status: DC

## 2013-04-22 ENCOUNTER — Ambulatory Visit: Payer: Medicare Other | Admitting: Cardiology

## 2013-05-12 ENCOUNTER — Ambulatory Visit (INDEPENDENT_AMBULATORY_CARE_PROVIDER_SITE_OTHER): Payer: Medicare Other | Admitting: Cardiology

## 2013-05-12 ENCOUNTER — Encounter: Payer: Self-pay | Admitting: Cardiology

## 2013-05-12 VITALS — BP 124/74 | HR 81 | Ht 64.0 in | Wt 186.0 lb

## 2013-05-12 DIAGNOSIS — I4891 Unspecified atrial fibrillation: Secondary | ICD-10-CM

## 2013-05-12 MED ORDER — METOPROLOL TARTRATE 25 MG PO TABS: ORAL_TABLET | ORAL | Status: DC

## 2013-05-12 NOTE — Patient Instructions (Signed)
Take metoprolol tartrate (lopressor) 12.5mg  two times a day. This will be 1/2 of a 25mg  tablet two times a day.   Your physician wants you to follow-up in: 6 months with Dr Shirlee Latch. (January 2015). You will receive a reminder letter in the mail two months in advance. If you don't receive a letter, please call our office to schedule the follow-up appointment.

## 2013-05-13 NOTE — Progress Notes (Signed)
Patient ID: Bridget Lane, female   DOB: October 01, 1933, 79 y.o.   MRN: [id number] PCP: Dr. Alwyn Ren  79 yo with history of paroxysmal atrial fibrillation presents for followup.  Patient has been seen by Dr. Riley Kill in the past and I am seeing her for the first time today. She has a history of paroxysmal atrial fibrillation and is on apixaban and flecainide.  No recent palpitations.  Metoprolol was stopped due to bradycardia.  Today, she is in NSR.  She can walk about 2 miles then gets winded.  No exertional chest pain.  She gets occasional vertigo-type dizziness when she turns quickly in one direction.    ECG: NSR, normal  Labs (4/14): Flecainide level normal, K 4.1, creatinine 0.8  PMH: 1. BPPV 2. OA 3. GERD 4. Hyperlipidemia 5. Paroxysmal atrial fibrillation 6. Multinodular goiter 7. Cholecystectomy 8. Appendectomy 9. Echo (7/13) with EF 55-60% 10. LHC/RHC (7/13) with mild nonobstructive CAD, LAD bridging; mean RA 4, PA 28/7, mean PCWP 7.  SH: Married, nonsmoker, lives in [place]  FH: No premature CAD  ROS: All systems reviewed and negative except as per HPI.   Assessment/Plan: 1. Atrial fibrillation: Paroxysmal.  EF was normal on 7/13 echo. Maintaining NSR on flecainide.  She had mild disease only on left heart cath in 2013.  However, she is not on a nodal blocker.  Given flecainide use, she needs nodal blockade.   - Start metoprolol 12.5 mg bid.  Her HR is 81 today, hopefully she can tolerate it.  - Continue apixaban.  - BMET/CBC at followup in 6 months.  2. Hyperlipidemia: Will get lipids at next appointment.   Marca Ancona 05/14/2013

## 2013-05-17 ENCOUNTER — Ambulatory Visit: Payer: Medicare Other | Admitting: Cardiology

## 2013-05-26 ENCOUNTER — Other Ambulatory Visit: Payer: Self-pay

## 2013-06-03 ENCOUNTER — Encounter: Payer: Self-pay | Admitting: Internal Medicine

## 2013-06-03 ENCOUNTER — Ambulatory Visit (INDEPENDENT_AMBULATORY_CARE_PROVIDER_SITE_OTHER): Payer: Medicare Other | Admitting: Internal Medicine

## 2013-06-03 VITALS — BP 120/68 | HR 64 | Resp 14 | Ht 64.5 in | Wt 185.0 lb

## 2013-06-03 DIAGNOSIS — Z0000 Encounter for general adult medical examination without abnormal findings: Secondary | ICD-10-CM

## 2013-06-03 DIAGNOSIS — F32A Depression, unspecified: Secondary | ICD-10-CM

## 2013-06-03 DIAGNOSIS — Z862 Personal history of diseases of the blood and blood-forming organs and certain disorders involving the immune mechanism: Secondary | ICD-10-CM

## 2013-06-03 DIAGNOSIS — F329 Major depressive disorder, single episode, unspecified: Secondary | ICD-10-CM

## 2013-06-03 DIAGNOSIS — Z8639 Personal history of other endocrine, nutritional and metabolic disease: Secondary | ICD-10-CM

## 2013-06-03 DIAGNOSIS — F3289 Other specified depressive episodes: Secondary | ICD-10-CM

## 2013-06-03 NOTE — Progress Notes (Signed)
Subjective:    Patient ID: Bridget Lane, female    DOB: 1933-06-19, 79 y.o.   MRN: [id number]  HPI Medicare Wellness Visit:  Psychosocial & medical history were reviewed as required by Medicare (abuse,antisocial behavioral risks,firearm risk).  Social history: caffeine: chocolate , alcohol: no  ,  tobacco use:  no  Exercise : walking 2 mpd 4X/week Home & personal  safety / fall risk: unsteady on feet Limitations of activities of daily living:no Seatbelt  and smoke alarm use:yes Power of Attorney/Living Will status : needed Ophthalmology exam status :current Hearing evaluation status:not current Orientation :oriented X 3 Memory & recall :good Spelling  testing:good Active depression / anxiety: "depression all my life"; offered referral; she'll consider Foreign travel history : 2004 [place] Immunization status for Shingles /Flu/ PNA/ tetanus :S/P shingles Transfusion history:  With 2 pregnancies Preventive health surveillance status of colonoscopy, BMD , mammograms,PAP as per protocol/ SOC: ?current Dental care: every 6 mos  Chart reviewed &  Updated. Active issues reviewed & addressed.      Review of Systems  She has a history of calcified thyroid nodule on the left lobe 1 x 1 x 1 cm. She's had no followup since 2009. Her TSH is also not current.     Objective:   Physical Exam  Gen.:  well-nourished in appearance. Alert, appropriate and cooperative throughout exam. Appears younger than stated age  Head: Normocephalic without obvious abnormalities  Eyes: No corneal or conjunctival inflammation noted.  Extraocular motion intact. Vision grossly normal with lenses Ears: External  ear exam reveals no significant lesions or deformities. Canals clear .TMs normal. Hearing is grossly normal bilaterally. Nose: External nasal exam reveals no deformity or inflammation. Nasal mucosa are pink and moist. No lesions or exudates noted.   Mouth: Oral mucosa and oropharynx reveal no lesions  or exudates. Teeth in good repair; upper partial. Neck: No deformities, masses, or tenderness noted. Range of motion decreased. Thyroid normal. Lungs: Normal respiratory effort; chest expands symmetrically. Lungs are clear to auscultation without rales, wheezes, or increased work of breathing. Heart: Normal rate and rhythm. Normal S1 and S2. No gallop, click, or rub. Grade 1/6 systolic murmur. Abdomen: Bowel sounds normal; abdomen soft and nontender. No masses, organomegaly or hernias noted. Genitalia: As per Gyn                                  Musculoskeletal/extremities: Accentuated curvature of  thoracic  Spine. No clubbing, cyanosis,  or significant extremity  deformity noted. Range of motion normal .Tone & strength  Normal. Joints  reveal severe  DJD DIP changes. Nail health good. Able to lie down & sit up w/o help. Negative SLR bilaterally Vascular: Carotid, radial artery, dorsalis pedis and  posterior tibial pulses are full and equal. No bruits present. Neurologic: Alert and oriented x3. Deep tendon reflexes symmetrical and normal.  Gait normal  including heel & toe walking .        Skin: Intact without suspicious lesions or rashes. Lymph: No cervical, axillary lymphadenopathy present. Psych: Mood and affect are normal. Normally interactive  Assessment & Plan:  #1 Medicare Wellness Exam; criteria met ; data entered #2 Problem List/Diagnoses reviewed #3 depression #4 thyroid nodule Plan:  Assessments made/ Orders entered

## 2013-06-03 NOTE — Patient Instructions (Addendum)
Please review the medication list in the After Visit Summary provided.Please verify the medication name (this may be  brand or generic) & correct dosage. Write the name of the prescribing physician to the right of the medication and share this with all medical staff seen at each appointment. This will help provide continuity of care; help optimize therapeutic interventions;and help prevent drug:drug adverse reaction. 

## 2013-06-09 ENCOUNTER — Ambulatory Visit
Admission: RE | Admit: 2013-06-09 | Discharge: 2013-06-09 | Disposition: A | Discharge status: Home / Self Care | Payer: Medicare Other | Source: Ambulatory Visit | Attending: Internal Medicine | Admitting: Internal Medicine

## 2013-06-09 DIAGNOSIS — Z8639 Personal history of other endocrine, nutritional and metabolic disease: Secondary | ICD-10-CM

## 2013-06-23 ENCOUNTER — Other Ambulatory Visit: Payer: Self-pay | Admitting: Internal Medicine

## 2013-06-23 ENCOUNTER — Other Ambulatory Visit: Payer: Self-pay | Admitting: Cardiology

## 2013-06-23 ENCOUNTER — Encounter: Payer: Self-pay | Admitting: Internal Medicine

## 2013-06-24 ENCOUNTER — Telehealth: Payer: Self-pay | Admitting: Internal Medicine

## 2013-06-24 NOTE — Telephone Encounter (Signed)
LM @ (5:09pm) asking the pt to RTC regarding message about lab results.//AB/CMA

## 2013-06-24 NOTE — Telephone Encounter (Signed)
Patient says that she reviewed her CPX lab results from 06/03/13 and realized that we did not check her A1c. Patient would like to know why.

## 2013-06-25 NOTE — Telephone Encounter (Signed)
Spoke with the pt and informed her that a HgbA1c was done b/c she does not have diabetes.  Pt understood and agreed.  Pt stated that she received a MyChart message regarding having a biopsy of a thyroid nodule or seeing an Endocrinologist.  Pt stated that she did not know what Dr. Alwyn Ren wanted her to do.  She stated that she did not want to make that discuss.  Pt finally stated that she would have to biopsy done.  Informed the pt that we would get back with her regarding the appt.  Pt agreed.//AB/CMA

## 2013-06-28 ENCOUNTER — Other Ambulatory Visit: Payer: Self-pay | Admitting: Obstetrics and Gynecology

## 2013-06-28 DIAGNOSIS — N6001 Solitary cyst of right breast: Secondary | ICD-10-CM

## 2013-07-05 ENCOUNTER — Other Ambulatory Visit: Payer: Medicare Other

## 2013-07-14 NOTE — Telephone Encounter (Signed)
Patient is calling back about scheduling a thyroid biopsy. States that she never heard back about what she should do. Please advise.

## 2013-07-15 ENCOUNTER — Telehealth: Payer: Self-pay | Admitting: Internal Medicine

## 2013-07-15 ENCOUNTER — Ambulatory Visit
Admission: RE | Admit: 2013-07-15 | Discharge: 2013-07-15 | Disposition: A | Discharge status: Home / Self Care | Payer: Medicare Other | Source: Ambulatory Visit | Attending: Obstetrics and Gynecology | Admitting: Obstetrics and Gynecology

## 2013-07-15 DIAGNOSIS — N6001 Solitary cyst of right breast: Secondary | ICD-10-CM

## 2013-07-15 NOTE — Telephone Encounter (Signed)
Patient states that she was supposed to have her thyroid aspirated today at 10:20am but she cannot find out where she is supposed to be for the appointment. Patient states that she was under the impression by whomever scheduled her mammogram that she was going to have both done at [facility], however, they do not have an appointment for her. Patient is confused and wants to know where she should go. Please advise.

## 2013-07-19 ENCOUNTER — Telehealth: Payer: Self-pay | Admitting: Internal Medicine

## 2013-07-19 NOTE — Telephone Encounter (Signed)
Tammy from [facility] called stating the patient is supposed to have a thyroid biopsy, but there is no order in the computer. She states 2 different areas are recommended and they will need 2 separate orders for GI- [address]. It looks like Dr. Alwyn Ren has given the patient some options. Can you find out if the patient wants to have biopsy and if so, have enter orders.

## 2013-07-20 ENCOUNTER — Telehealth: Payer: Self-pay | Admitting: *Deleted

## 2013-07-20 NOTE — Telephone Encounter (Signed)
See telephone call on 07-20-13.//AB/CMA

## 2013-07-20 NOTE — Telephone Encounter (Signed)
Spoke with the pt and she agreed to [place] scheduling the fine needle aspiration.//AB/CMA

## 2013-07-20 NOTE — Telephone Encounter (Signed)
Message copied by Verdie Shire on Tue Jul 20, 2013  3:09 PM ------      Message from: Pecola Lawless      Created: Tue Jul 20, 2013 12:29 PM       Any "dominant" ( size > 1 cm ) thyroid nodule is usually biopsied under Xray guidance with a very fine needle. Thre are actually TWO such nodules . May I schedule fine needle aspiration of these? ------

## 2013-07-20 NOTE — Telephone Encounter (Signed)
Patient called again about getting her thyroid biopsy done. Please call patient as soon as possible

## 2013-07-21 ENCOUNTER — Other Ambulatory Visit: Payer: Self-pay | Admitting: Internal Medicine

## 2013-07-21 DIAGNOSIS — E042 Nontoxic multinodular goiter: Secondary | ICD-10-CM

## 2013-07-21 NOTE — Telephone Encounter (Signed)
Find needle aspiration has been scheduled.//AB/CMA

## 2013-07-30 ENCOUNTER — Other Ambulatory Visit: Payer: Self-pay | Admitting: Internal Medicine

## 2013-08-02 ENCOUNTER — Telehealth: Payer: Self-pay | Admitting: Internal Medicine

## 2013-08-02 MED ORDER — OMEPRAZOLE 40 MG PO CPDR: DELAYED_RELEASE_CAPSULE | ORAL | Status: DC

## 2013-08-02 NOTE — Telephone Encounter (Signed)
rx sent

## 2013-08-03 ENCOUNTER — Ambulatory Visit
Admission: RE | Admit: 2013-08-03 | Discharge: 2013-08-03 | Disposition: A | Discharge status: Home / Self Care | Payer: Medicare Other | Source: Ambulatory Visit | Attending: Internal Medicine | Admitting: Internal Medicine

## 2013-08-03 ENCOUNTER — Other Ambulatory Visit (HOSPITAL_COMMUNITY)
Admission: RE | Admit: 2013-08-03 | Discharge: 2013-08-03 | Disposition: A | Discharge status: Home / Self Care | Payer: Medicare Other | Source: Ambulatory Visit | Attending: Interventional Radiology | Admitting: Interventional Radiology

## 2013-08-03 DIAGNOSIS — E042 Nontoxic multinodular goiter: Secondary | ICD-10-CM

## 2013-08-03 DIAGNOSIS — E041 Nontoxic single thyroid nodule: Secondary | ICD-10-CM | POA: Insufficient documentation

## 2013-08-18 ENCOUNTER — Encounter: Payer: Self-pay | Admitting: Internal Medicine

## 2013-08-18 ENCOUNTER — Ambulatory Visit (INDEPENDENT_AMBULATORY_CARE_PROVIDER_SITE_OTHER): Payer: Medicare Other | Admitting: Internal Medicine

## 2013-08-18 ENCOUNTER — Ambulatory Visit (INDEPENDENT_AMBULATORY_CARE_PROVIDER_SITE_OTHER)
Admission: RE | Admit: 2013-08-18 | Discharge: 2013-08-18 | Disposition: A | Discharge status: Home / Self Care | Payer: Medicare Other | Source: Ambulatory Visit | Attending: Internal Medicine | Admitting: Internal Medicine

## 2013-08-18 ENCOUNTER — Other Ambulatory Visit: Payer: Self-pay | Admitting: Internal Medicine

## 2013-08-18 VITALS — BP 138/71 | HR 58 | Temp 98.3°F | Wt 185.0 lb

## 2013-08-18 DIAGNOSIS — E042 Nontoxic multinodular goiter: Secondary | ICD-10-CM

## 2013-08-18 LAB — CBC WITH DIFFERENTIAL/PLATELET
Basophils Absolute: 0.1 10*3/uL (ref 0.0–0.1)
Eosinophils Absolute: 0.1 10*3/uL (ref 0.0–0.7)
Eosinophils Relative: 1.6 % (ref 0.0–5.0)
HCT: 38.8 % (ref 36.0–46.0)
Hemoglobin: 12.9 g/dL (ref 12.0–15.0)
Lymphs Abs: 2.1 10*3/uL (ref 0.7–4.0)
MCHC: 33.3 g/dL (ref 30.0–36.0)
Monocytes Relative: 7.7 % (ref 3.0–12.0)
Neutro Abs: 4.3 10*3/uL (ref 1.4–7.7)
Neutrophils Relative %: 60.3 % (ref 43.0–77.0)
Platelets: 307 10*3/uL (ref 150.0–400.0)
RDW: 13.7 % (ref 11.5–14.6)
WBC: 7.2 10*3/uL (ref 4.5–10.5)

## 2013-08-18 LAB — HEPATIC FUNCTION PANEL
ALT: 19 U/L (ref 0–35)
AST: 18 U/L (ref 0–37)
Albumin: 4 g/dL (ref 3.5–5.2)
Bilirubin, Direct: 0 mg/dL (ref 0.0–0.3)
Total Bilirubin: 0.6 mg/dL (ref 0.3–1.2)
Total Protein: 7.5 g/dL (ref 6.0–8.3)

## 2013-08-18 LAB — SEDIMENTATION RATE: Sed Rate: 18 mm/hr (ref 0–22)

## 2013-08-18 NOTE — Progress Notes (Signed)
  Subjective:    Patient ID: Bridget Lane, female    DOB: 1933/02/03, 79 y.o.   MRN: [id number]  HPI The fine needle aspiration biopsies revealed lymphocytic infiltrates in  both nodules in the left lobe.  She has intermittent leg discomfort in the neck area. She also notices that her voice has gotten deeper and breaks up frequently.  Thyroid function tests were normal in August of this year. Her last liver function tests in January were also normal. Her last CBC and differential was in November 2013 and was normal.  Her sister had hypothyroidism.  She has no past medical history of facial or neck irradiation.    Review of Systems  She does describe intermittent dysphagia particularly if she rushes her meals.  She denies fever, chills, sweats, weight loss.     Objective:   Physical Exam General appearance is one of good health and nourishment w/o distress.  Eyes: No conjunctival inflammation or scleral icterus is present.  Oral exam: Dental hygiene is good; lips and gums are healthy appearing. Upper partial.There is no oropharyngeal erythema or exudate noted.   Heart:  Normal rate and regular rhythm. S1 and S2 normal without gallop, murmur, click, rub or other extra sounds     Lungs:Chest clear to auscultation; no wheezes, rhonchi,rales ,or rubs present.No increased work of breathing.   Abdomen: bowel sounds normal, soft and non-tender without masses, organomegaly or hernias noted. Dullness RUQ to percussion. No guarding or rebound .   Musculoskeletal: Able to lie flat and sit up without help. Skin:Warm & dry.  Intact without suspicious lesions or rashes ; no jaundice   Lymphatic: No lymphadenopathy is noted about the head, neck, axilla                Assessment & Plan:  See Current Assessment & Plan in Problem List under specific Diagnosis

## 2013-08-18 NOTE — Assessment & Plan Note (Signed)
If labs and imaging negative; referral to endocrinology to rule out a primary lymphocytic thyroid process.

## 2013-08-18 NOTE — Patient Instructions (Signed)
Your next office appointment will be determined based upon review of your pending labs & x-rays. Those instructions will be transmitted to you through My Chart. Please report any significant change in your symptoms.

## 2013-08-25 ENCOUNTER — Encounter: Payer: Self-pay | Admitting: Endocrinology

## 2013-08-25 ENCOUNTER — Ambulatory Visit (INDEPENDENT_AMBULATORY_CARE_PROVIDER_SITE_OTHER): Payer: Medicare Other | Admitting: Endocrinology

## 2013-08-25 VITALS — BP 134/80 | HR 88 | Ht 64.0 in | Wt 183.0 lb

## 2013-08-25 DIAGNOSIS — E042 Nontoxic multinodular goiter: Secondary | ICD-10-CM

## 2013-08-25 NOTE — Patient Instructions (Signed)
Please return in 1 year. most of the time, a "lumpy thyroid" will eventually become overactive.  this is usually a slow process, happening over the span of many years. 

## 2013-08-25 NOTE — Progress Notes (Signed)
Subjective:    Patient ID: Bridget Lane, female    DOB: May 06, 1933, 79 y.o.   MRN: [id number]  HPI Pt says she was told in the 1990's that she had thyroid nodules.  She has slight pain at the left lateral neck, in the context of twisting the neck, but no assoc swelling.  Past Medical History  Diagnosis Date  . OA (osteoarthritis)   . DJD (degenerative joint disease)   . Macular degeneration   . Depression   . Colon polyp 2005  . Esophageal stricture   . Hyperlipidemia   . H. pylori infection   . GERD (gastroesophageal reflux disease)   . Nontoxic multinodular goiter   . Hiatal hernia   . Atrial fibrillation     persistent    Past Surgical History  Procedure Laterality Date  . Appendectomy    . Tubal ligation      with appendectomy  . Cholecystectomy  1989  . Total abdominal hysterectomy  1972    for pain (no BSO)  . Esophageal dilation      > 3 X; Dr Juanda Chance  . Colonoscopy w/ polypectomy  2005    Neg in 2010; Dr Juanda Chance  . Cardioversion  08/29/2011    Procedure: CARDIOVERSION;  Surgeon: Shawnie Pons, MD;  Location: [facility] OR;  Service: Cardiovascular;  Laterality: N/A;  . Cardioversion  11/22/2011    Procedure: CARDIOVERSION;  Surgeon: Gardiner Rhyme, MD;  Location: [facility] OR;  Service: Cardiovascular;  Laterality: N/A;  . Upper gi endoscopy  2010    H pylori    History   Social History  . Marital Status: Married    Spouse Name: N/A    Number of Children: N/A  . Years of Education: N/A   Occupational History  . Not on file.   Social History Main Topics  . Smoking status: Never Smoker   . Smokeless tobacco: Never Used  . Alcohol Use: No  . Drug Use: No  . Sexual Activity: Not on file   Other Topics Concern  . Not on file   Social History Narrative   Pt lives in [place] with spouse.  Retired from [employer].    Current Outpatient Prescriptions on File Prior to Visit  Medication Sig Dispense Refill  . acetaminophen (TYLENOL) 325 MG  tablet Take 650 mg by mouth. 2 by mouth two times daily      . ELIQUIS 5 MG TABS tablet TAKE 1 TABLET BY MOUTH TWICE DAILY  60 tablet  2  . flecainide (TAMBOCOR) 100 MG tablet Take 1 tablet (100 mg total) by mouth 2 (two) times daily.  60 tablet  9  . Melatonin 3 MG TABS Take 3 mg by mouth at bedtime.       . metoprolol tartrate (LOPRESSOR) 25 MG tablet 1/2 tablet (total 12.5mg ) two times a day  30 tablet  6  . Multiple Vitamin (MULTIVITAMIN) tablet Take 1 tablet by mouth daily.      Marland Kitchen omeprazole (PRILOSEC) 40 MG capsule TAKE ONE CAPSULE BY MOUTH TWICE A DAY  60 capsule  0   No current facility-administered medications on file prior to visit.    Allergies  Allergen Reactions  . Penicillins     REACTION: rash Because of a history of documented adverse serious drug reaction;Medi Alert bracelet  is recommended  . Sulfonamide Derivatives     REACTION: rash Because of a history of documented adverse serious drug reaction;Medi Alert bracelet  is recommended  .  Codeine     REACTION: ? hallucinations  . Dabigatran Etexilate Mesylate      All extremities feel heavy and hurt  . Fentanyl     Chest pain, dyspnea ("she almost died")  . Acetaminophen     REACTION: hurts stomach    Family History  Problem Relation Age of Onset  . Colon cancer Father   . Osteoarthritis Father   . Coronary artery disease Father   . Heart failure Mother   . Coronary artery disease Mother   . Diabetes Mother   . Breast cancer Sister   . Diabetes Sister   . Stroke Sister   . Heart attack Sister   . Coronary artery disease Brother   . Throat cancer Brother     smoked  . Breast cancer Maternal Aunt   . Hypothyroidism Sister   sister has unknown type of thyroid problem  BP 134/80  Pulse 88  Ht 5\' 4"  (1.626 m)  Wt 183 lb (83.008 kg)  BMI 31.40 kg/m2  SpO2 98%  Review of Systems denies headache, double vision, palpitations, sob, diarrhea, excessive diaphoresis, numbness, tremor, menopausal sxs, easy  bruising, and rhinorrhea.  She has dysphagia (dx was esophageal stricture).  She has weight gain.  She has a few years of deepening of the voice.  She has urinary frequency, anxiety, and diffuse arthralgias.      Objective:   Physical Exam VS: see vs page GEN: no distress HEAD: head: no deformity eyes: no periorbital swelling, no proptosis external nose and ears are normal mouth: no lesion seen NECK: i cannot appreciate the goiter CHEST WALL: no deformity LUNGS:  Clear to auscultation. CV: reg rate and rhythm, no murmur ABD: abdomen is soft, nontender.  no hepatosplenomegaly.  not distended.  no hernia MUSCULOSKELETAL: muscle bulk and strength are grossly normal.  no obvious joint swelling.  gait is normal and steady EXTEMITIES: no deformity.  no ulcer on the feet.  feet are of normal color and temp.  no edema PULSES: dorsalis pedis intact bilat.  no carotid bruit NEURO:  cn 2-12 grossly intact.   readily moves all 4's.  sensation is intact to touch on the feet SKIN:  Normal texture and temperature.  No rash or suspicious lesion is visible.   NODES:  None palpable at the neck PSYCH: alert, oriented x3.  Does not appear anxious nor depressed.  Lab Results  Component Value Date   TSH 0.77 06/03/2013  (i also reviewed thyroid US and cytology reports)    Assessment & Plan:  Multinodular goiter: usually hereditary.  Although cytol was suggestive of lymphocytic thyroiditis, this low-normal TSH is more suggestive of hyperfunctioning nodules. Weight gain: not thyroid-related Neck pain: not thyroid-related

## 2013-08-26 ENCOUNTER — Other Ambulatory Visit: Payer: Self-pay

## 2013-08-30 ENCOUNTER — Encounter: Payer: Self-pay | Admitting: Internal Medicine

## 2013-08-31 ENCOUNTER — Other Ambulatory Visit: Payer: Self-pay | Admitting: Internal Medicine

## 2013-09-08 ENCOUNTER — Ambulatory Visit (INDEPENDENT_AMBULATORY_CARE_PROVIDER_SITE_OTHER): Payer: Medicare Other | Admitting: *Deleted

## 2013-09-08 DIAGNOSIS — Z2911 Encounter for prophylactic immunotherapy for respiratory syncytial virus (RSV): Secondary | ICD-10-CM

## 2013-09-08 DIAGNOSIS — Z23 Encounter for immunization: Secondary | ICD-10-CM

## 2013-09-10 ENCOUNTER — Encounter: Payer: Self-pay | Admitting: Internal Medicine

## 2013-09-10 ENCOUNTER — Ambulatory Visit (INDEPENDENT_AMBULATORY_CARE_PROVIDER_SITE_OTHER): Payer: Medicare Other | Admitting: Internal Medicine

## 2013-09-10 VITALS — BP 122/80 | HR 76 | Ht 64.0 in | Wt 186.6 lb

## 2013-09-10 DIAGNOSIS — K219 Gastro-esophageal reflux disease without esophagitis: Secondary | ICD-10-CM

## 2013-09-10 DIAGNOSIS — K224 Dyskinesia of esophagus: Secondary | ICD-10-CM

## 2013-09-10 DIAGNOSIS — Z800 Family history of malignant neoplasm of digestive organs: Secondary | ICD-10-CM

## 2013-09-10 NOTE — Progress Notes (Signed)
Bridget Lane 08/27/1945 MRN [id number]   History of Present Illness:  This is a 79 year old white female who is here to discuss having a recall colonoscopy. She has a history of gastroesophageal reflux controlled on Prilosec 40 mg twice a day. She also has irritable bowel syndrome. Her last office visit was in May 2012. She was treated for H. pylori gastropathy in October 2009 after an upper endoscopy showed spasm in the esophagus but no stricture. She was dilated with 48 and 50 [place] Maloney dilators. A Pylor[employer] following the H.Pylori prescription was negative. A barium swallow at that time showed esophageal dysmotility, impaired secondary peristalsis, prolonged pooling of the barium in the esophagus and tertiary contractions. She was having reflux to the level of the clavicle. Her colorectal screening has been up to date. Her last colonoscopy was in September 2010. Prior to that, she had a colonoscopy in 2005 which showed internal hemorrhoids. Her father had colon cancer. She will be due for a recall colonoscopy in September 2015.  Today, the patient is very angry with me since  her appointment was at 3:15 and I saw her at 4:20. She was disrespectful. When I asked her what I can help her with, she responded that I should know. She was trying to tell me that she is due for a colonoscopy. She does not accept my recommendation to undergo  recall colonoscopy in  Sept 2015, based on current guidelines for colorectal screening in patients with direct relatives with colon cancer.. She got up in the middle of our conversation and left very irritated.I apologized for running late.   Past Medical History  Diagnosis Date  . OA (osteoarthritis)   . DJD (degenerative joint disease)   . Macular degeneration   . Depression   . Colon polyp 2005  . Esophageal stricture   . Hyperlipidemia   . H. pylori infection   . GERD (gastroesophageal reflux disease)   . Nontoxic multinodular goiter   . Hiatal hernia    . Atrial fibrillation     persistent   Past Surgical History  Procedure Laterality Date  . Appendectomy    . Tubal ligation      with appendectomy  . Cholecystectomy  1989  . Total abdominal hysterectomy  1972    for pain (no BSO)  . Esophageal dilation      > 3 X; Dr Juanda Chance  . Colonoscopy w/ polypectomy  2005    Neg in 2010; Dr Juanda Chance  . Cardioversion  08/29/2011    Procedure: CARDIOVERSION;  Surgeon: Shawnie Pons, MD;  Location: [facility] OR;  Service: Cardiovascular;  Laterality: N/A;  . Cardioversion  11/22/2011    Procedure: CARDIOVERSION;  Surgeon: Gardiner Rhyme, MD;  Location: [facility] OR;  Service: Cardiovascular;  Laterality: N/A;  . Upper gi endoscopy  2010    H pylori    reports that she has never smoked. She has never used smokeless tobacco. She reports that she does not drink alcohol or use illicit drugs. family history includes Breast cancer in her maternal aunt and sister; Colon cancer in her father; Coronary artery disease in her brother, father, and mother; Diabetes in her mother and sister; Heart attack in her sister; Heart failure in her mother; Hypothyroidism in her sister; Osteoarthritis in her father; Stroke in her sister; Throat cancer in her brother. Allergies  Allergen Reactions  . Penicillins     REACTION: rash Because of a history of documented adverse serious drug reaction;Medi Alert bracelet  is  recommended  . Sulfonamide Derivatives     REACTION: rash Because of a history of documented adverse serious drug reaction;Medi Alert bracelet  is recommended  . Codeine     REACTION: ? hallucinations  . Dabigatran Etexilate Mesylate      All extremities feel heavy and hurt  . Fentanyl     Chest pain, dyspnea  . Acetaminophen     REACTION: hurts stomach        Review of Systems:  The remainder of the 10 point ROS is negative except as outlined in H&P   Physical Exam: General appearance  Well developed, in no distress, Appears angry. Psychological normal  mood and affect.  Assessment and Plan:  Problem #1 This is a 79 year old white female who came to discuss the timing of her next colonoscopy. However, our session was interrupted by the patient leaving the room; the main reason being that she had to wait over an hour to see me. She also didn't agree with my suggestions for a recall colonoscopy, Unfortunately, we did not have time to come to an agreement. She will receive a recall letter in September 2015 to have a colonoscopy scheduled.and she may chose one of my associates to re-discuss it.   09/10/2013 Bridget Lane

## 2013-09-10 NOTE — Patient Instructions (Signed)
Dr Hopper 

## 2013-09-26 ENCOUNTER — Other Ambulatory Visit: Payer: Self-pay | Admitting: Cardiology

## 2013-09-27 ENCOUNTER — Telehealth: Payer: Self-pay

## 2013-09-27 NOTE — Telephone Encounter (Signed)
Pt aware of Dr Alford Highland recommendation for Xarelto in the place of Eliquis. She is going to check with her insurance and let me know what she wants to do.

## 2013-09-27 NOTE — Telephone Encounter (Signed)
OK to give her samples. LMTCB for pt

## 2013-09-27 NOTE — Telephone Encounter (Signed)
Patient states she can not afford the monthly cost of Eliquis.  She is requesting either to have her medication switched to something more affordable or for Dr. Shirlee Latch to write a letter of medical necessity to [facility]. Informed patient that message/request will be routed to Dr. Gae Gallop, RN and that in the meantime, a month's supply of Eliquis samples has been left at the front desk for her to pick up so that she does not have an interruption in her treatment. Patient verbalized understanding and appreciation.

## 2013-09-27 NOTE — Telephone Encounter (Signed)
Can see if Xarelto 20 is any cheaper, if now will have to go on coumadin.

## 2013-09-27 NOTE — Telephone Encounter (Signed)
That would be fine.  She can switch from Eliquis to Xarelto 20 mg daily.

## 2013-09-27 NOTE — Telephone Encounter (Signed)
She can see if Xarelto 20 mg daily is covered.  If not, will need to go to coumadin.

## 2013-09-27 NOTE — Telephone Encounter (Signed)
Bridget Lane called back and said that her insurance does cover Xarelto 20 mg and wanted to know if she get some samples to start on to see how she would feel on this med.

## 2013-09-28 NOTE — Telephone Encounter (Signed)
SAMPLES

## 2013-09-30 ENCOUNTER — Other Ambulatory Visit: Payer: Self-pay | Admitting: *Deleted

## 2013-09-30 MED ORDER — APIXABAN 5 MG PO TABS: 5.0000 mg | ORAL_TABLET | Freq: Two times a day (BID) | ORAL | Status: DC

## 2013-10-11 ENCOUNTER — Other Ambulatory Visit: Payer: Self-pay | Admitting: Internal Medicine

## 2013-10-12 NOTE — Telephone Encounter (Signed)
Message copied by Richardson Chiquito on Tue Oct 12, 2013  2:11 PM ------      Message from: Hart Carwin      Created: Tue Oct 12, 2013  1:24 PM       Please have Dr Alwyn Ren to refill her meds.      ----- Message -----         From: Richardson Chiquito, CMA         Sent: 10/12/2013   8:35 AM           To: Hart Carwin, MD            Dr Juanda Chance,       This patient requests refills on omeprazole. However, it looks like she left during her last office visit while you were trying to discuss colon with her. Am I okay to continue refilling her omeprazole rx?       ------

## 2013-10-18 ENCOUNTER — Other Ambulatory Visit: Payer: Self-pay | Admitting: Internal Medicine

## 2013-10-18 ENCOUNTER — Telehealth: Payer: Self-pay | Admitting: Internal Medicine

## 2013-10-18 NOTE — Telephone Encounter (Signed)
Sent prescription to patients pharmacy.  

## 2013-10-27 ENCOUNTER — Telehealth: Payer: Self-pay | Admitting: *Deleted

## 2013-10-27 NOTE — Telephone Encounter (Signed)
Patient requests xarelto samples to get her through until her appointment. She is aware that they will be at the front desk for pick up.

## 2013-10-27 NOTE — Telephone Encounter (Signed)
samples

## 2013-11-04 ENCOUNTER — Other Ambulatory Visit: Payer: Self-pay | Admitting: Neurosurgery

## 2013-11-04 ENCOUNTER — Telehealth: Payer: Self-pay | Admitting: Cardiology

## 2013-11-04 DIAGNOSIS — M5412 Radiculopathy, cervical region: Secondary | ICD-10-CM

## 2013-11-04 NOTE — Telephone Encounter (Signed)
Bridget Lane is requesting pt hold Xarelto for 1 day prior to facet injection to  cervical spine. I will forward to Dr Aundra Dubin.

## 2013-11-04 NOTE — Telephone Encounter (Signed)
That would be ok.

## 2013-11-04 NOTE — Telephone Encounter (Signed)
New Message  Pt will have to come off of Xarelto for 1 day// Surgery not scheduled/// Please call

## 2013-11-08 ENCOUNTER — Encounter: Payer: Self-pay | Admitting: Cardiology

## 2013-11-08 ENCOUNTER — Ambulatory Visit (INDEPENDENT_AMBULATORY_CARE_PROVIDER_SITE_OTHER): Payer: Medicare Other | Admitting: Cardiology

## 2013-11-08 ENCOUNTER — Encounter: Payer: Self-pay | Admitting: *Deleted

## 2013-11-08 VITALS — BP 128/76 | HR 95 | Ht 64.5 in | Wt 183.0 lb

## 2013-11-08 DIAGNOSIS — I4891 Unspecified atrial fibrillation: Secondary | ICD-10-CM

## 2013-11-08 NOTE — Progress Notes (Signed)
Patient ID: Bridget Lane, female   DOB: [birth year], 80 y.o.   MRN: [id number] PCP: Dr. Linna Darner  80 yo with history of paroxysmal atrial fibrillation presents for followup. She has a history of paroxysmal atrial fibrillation and is on apixaban and flecainide.  No recent palpitations.  Metoprolol had been stopped due to bradycardia, but at last appointment, I put her back on metoprolol 12.5 mg bid. She has tolerated this with no problems.  Today, she is in NSR.  She can walk about 2 miles then gets winded.  No exertional chest pain.  She was switched over to Xarelto because her insurance no longer covers Eliquis.  She does not like Xarelto due to extensive bruising and wants to go back to Eliquis.    ECG: NSR, LVH  Labs (4/14): Flecainide level normal, K 4.1, creatinine 0.8 Labs (10/14): LFTs normal, TSH normal, HCT 38.8  PMH: 1. BPPV 2. OA 3. GERD 4. Hyperlipidemia 5. Paroxysmal atrial fibrillation 6. Multinodular goiter 7. Cholecystectomy 8. Appendectomy 9. Echo (7/13) with EF 55-60% 10. LHC/RHC (7/13) with mild nonobstructive CAD, LAD bridging; mean RA 4, PA 28/7, mean PCWP 7. 11. Esophageal dysmotility.  12. C-spine arthritis  SH: Married, nonsmoker, lives in [place]  FH: No premature CAD  Current Outpatient Prescriptions  Medication Sig Dispense Refill  . acetaminophen (TYLENOL) 325 MG tablet Take 650 mg by mouth. 2 by mouth two times daily      . flecainide (TAMBOCOR) 100 MG tablet Take 1 tablet (100 mg total) by mouth 2 (two) times daily.  60 tablet  9  . Melatonin 3 MG TABS Take 3 mg by mouth at bedtime.       . metoprolol tartrate (LOPRESSOR) 25 MG tablet 1/2 tablet (total 12.5mg ) two times a day  30 tablet  6  . Multiple Vitamin (MULTIVITAMIN) tablet Take 1 tablet by mouth daily.      Marland Kitchen omeprazole (PRILOSEC) 40 MG capsule TAKE ONE CAPSULE BY MOUTH TWICE A DAY  60 capsule  5  . Rivaroxaban (XARELTO) 20 MG TABS tablet Take 20 mg by mouth daily with supper.       No  current facility-administered medications for this visit.   BP 128/76  Pulse 95  Ht 5' 4.5" (1.638 m)  Wt 83.008 kg (183 lb)  BMI 30.94 kg/m2 General: NAD Neck: No JVD, no thyromegaly or thyroid nodule.  Lungs: Clear to auscultation bilaterally with normal respiratory effort. CV: Nondisplaced PMI.  Heart regular S1/S2, no S3/S4, no murmur.  No peripheral edema.  No carotid bruit.  Normal pedal pulses.  Abdomen: Soft, nontender, no hepatosplenomegaly, no distention.  Skin: Intact without lesions or rashes.  Neurologic: Alert and oriented x 3.  Psych: Normal affect. Extremities: No clubbing or cyanosis.   Assessment/Plan: 1. Atrial fibrillation: Paroxysmal.  EF was normal on 7/13 echo. Maintaining NSR on flecainide and tolerating metoprolol without significant bradycardia.  She had mild disease only on left heart cath in 2013.   - Continue flecainide and metoprolol.  - Continue anticoagulation, I will see if we can get her back on apixaban.  - BMET today.   2. I would like to see her start to get more regular with exercise.   Loralie Champagne 11/08/2013

## 2013-11-08 NOTE — Patient Instructions (Addendum)
Your physician recommends that you have lab work today--BMET.  Your physician wants you to follow-up in: 6 months with Dr Aundra Dubin. (July 2015). You will receive a reminder letter in the mail two months in advance. If you don't receive a letter, please call our office to schedule the follow-up appointment.

## 2013-11-08 NOTE — Telephone Encounter (Signed)
LM for Andee Poles that it is OK to hold Xarelto for 1 day prior to cervical spine injection.

## 2013-11-09 LAB — BASIC METABOLIC PANEL
BUN: 18 mg/dL (ref 6–23)
CHLORIDE: 107 meq/L (ref 96–112)
CO2: 24 meq/L (ref 19–32)
Calcium: 9 mg/dL (ref 8.4–10.5)
Creatinine, Ser: 1 mg/dL (ref 0.4–1.2)
GFR: 60.39 mL/min (ref >60.00)
GLUCOSE: 147 mg/dL — AB (ref 70–99)
POTASSIUM: 3.7 meq/L (ref 3.5–5.1)
SODIUM: 139 meq/L (ref 135–145)

## 2013-11-10 ENCOUNTER — Other Ambulatory Visit: Payer: Self-pay | Admitting: *Deleted

## 2013-11-15 ENCOUNTER — Telehealth: Payer: Self-pay | Admitting: *Deleted

## 2013-11-15 NOTE — Telephone Encounter (Signed)
Tier exception sent for [facility] to [facility] member # [id number]

## 2013-11-16 ENCOUNTER — Other Ambulatory Visit: Payer: Self-pay | Admitting: Neurosurgery

## 2013-11-16 ENCOUNTER — Ambulatory Visit
Admission: RE | Admit: 2013-11-16 | Discharge: 2013-11-16 | Disposition: A | Discharge status: Home / Self Care | Payer: Medicare Other | Source: Ambulatory Visit | Attending: Neurosurgery | Admitting: Neurosurgery

## 2013-11-16 DIAGNOSIS — M5412 Radiculopathy, cervical region: Secondary | ICD-10-CM

## 2013-11-16 MED ORDER — DEXAMETHASONE SODIUM PHOSPHATE 4 MG/ML IJ SOLN
6.0000 mg | Freq: Once | INTRAMUSCULAR | Status: AC
  Administered 2013-11-16: 6 mg

## 2013-11-16 MED ORDER — IOHEXOL 300 MG/ML  SOLN
1.0000 mL | Freq: Once | INTRAMUSCULAR | Status: AC | PRN
  Administered 2013-11-16: 1 mL via INTRA_ARTICULAR

## 2013-11-16 NOTE — Discharge Instructions (Signed)

## 2013-11-22 ENCOUNTER — Telehealth: Payer: Self-pay | Admitting: Cardiology

## 2013-11-22 ENCOUNTER — Other Ambulatory Visit: Payer: Self-pay | Admitting: Cardiology

## 2013-11-22 MED ORDER — RIVAROXABAN 20 MG PO TABS: 20.0000 mg | ORAL_TABLET | Freq: Every day | ORAL | Status: DC

## 2013-11-22 NOTE — Telephone Encounter (Signed)
New problem   Pt is ready for her Eliquis to be called in to her pharmacy. Please call pt when this has been called in.

## 2013-11-25 ENCOUNTER — Telehealth: Payer: Self-pay

## 2013-11-25 DIAGNOSIS — I4891 Unspecified atrial fibrillation: Secondary | ICD-10-CM

## 2013-11-25 MED ORDER — APIXABAN 5 MG PO TABS: 5.0000 mg | ORAL_TABLET | Freq: Two times a day (BID) | ORAL | Status: DC

## 2013-11-25 NOTE — Telephone Encounter (Signed)
Patient called states that she was suppose to change from xarelto to eliquis, if please send in a rx

## 2013-11-25 NOTE — Telephone Encounter (Signed)
May stop Xarelto and start apixaban 5 mg bid.

## 2013-11-25 NOTE — Telephone Encounter (Signed)
Pt advised, BMET/CBCd scheduled for 12/24/13.

## 2013-11-30 ENCOUNTER — Telehealth: Payer: Self-pay | Admitting: Internal Medicine

## 2013-12-01 NOTE — Telephone Encounter (Signed)
It appears patient was given refills of omeprazole in error as Dr Olevia Perches requested that patient get further refills from Dr Linna Darner since patient left during the middle of her visit with Dr Olevia Perches on 09/10/13. Therefore, after the refills of omeprazole that were previously sent run out, patient will need an office visit with Dr Olevia Perches (or one of her associates if she chooses) prior to any other refills of medication. I have spoken to [place] at [employer] company to obtain prior auth for medication. She states that determination will be made and a decision will be faxed to [place].

## 2013-12-24 ENCOUNTER — Other Ambulatory Visit: Payer: Medicare Other

## 2013-12-27 ENCOUNTER — Other Ambulatory Visit (INDEPENDENT_AMBULATORY_CARE_PROVIDER_SITE_OTHER): Payer: Medicare Other

## 2013-12-27 DIAGNOSIS — I4891 Unspecified atrial fibrillation: Secondary | ICD-10-CM

## 2013-12-28 LAB — BASIC METABOLIC PANEL
BUN: 17 mg/dL (ref 6–23)
CALCIUM: 9.4 mg/dL (ref 8.4–10.5)
CO2: 25 meq/L (ref 19–32)
CREATININE: 1.1 mg/dL (ref 0.4–1.2)
Chloride: 108 mEq/L (ref 96–112)
GFR: 51.51 mL/min — ABNORMAL LOW (ref >60.00)
Glucose, Bld: 88 mg/dL (ref 70–99)
Potassium: 4.4 mEq/L (ref 3.5–5.1)
Sodium: 141 mEq/L (ref 135–145)

## 2013-12-28 LAB — CBC WITH DIFFERENTIAL/PLATELET
BASOS ABS: 0 10*3/uL (ref 0.0–0.1)
BASOS PCT: 0.4 % (ref 0.0–3.0)
Eosinophils Absolute: 0.1 10*3/uL (ref 0.0–0.7)
Eosinophils Relative: 1.2 % (ref 0.0–5.0)
HEMATOCRIT: 37.4 % (ref 36.0–46.0)
HEMOGLOBIN: 12.3 g/dL (ref 12.0–15.0)
LYMPHS ABS: 2.8 10*3/uL (ref 0.7–4.0)
Lymphocytes Relative: 33.9 % (ref 12.0–46.0)
MCHC: 32.9 g/dL (ref 30.0–36.0)
MCV: 86.6 fl (ref 78.0–100.0)
Monocytes Absolute: 0.5 10*3/uL (ref 0.1–1.0)
Monocytes Relative: 5.8 % (ref 3.0–12.0)
NEUTROS ABS: 4.8 10*3/uL (ref 1.4–7.7)
Neutrophils Relative %: 58.7 % (ref 43.0–77.0)
Platelets: 308 10*3/uL (ref 150.0–400.0)
RBC: 4.32 Mil/uL (ref 3.87–5.11)
RDW: 14.1 % (ref 11.5–14.6)
WBC: 8.2 10*3/uL (ref 4.5–10.5)

## 2013-12-31 ENCOUNTER — Telehealth: Payer: Self-pay | Admitting: *Deleted

## 2013-12-31 NOTE — Telephone Encounter (Signed)
Pt is aware that this may not be a side effect of Eliquis Horton Chin RN

## 2013-12-31 NOTE — Telephone Encounter (Signed)
This is not a likely side effect with Eliquis.

## 2013-12-31 NOTE — Telephone Encounter (Signed)
Pt aware of lab results She would like Dr. Aundra Dubin that her knee joints seem to more achy & throbbing especially at night. She wasn't sure if this was related to the Eliquis  Forwarded to Dr. Alva Garnet RN]

## 2013-12-31 NOTE — Telephone Encounter (Signed)
Message copied by Verna Czech on Fri Dec 31, 2013  1:39 PM ------      Message from: Larey Dresser      Created: Fri Dec 31, 2013 12:21 PM       Good labs ------

## 2014-01-18 ENCOUNTER — Other Ambulatory Visit: Payer: Self-pay | Admitting: Cardiology

## 2014-02-15 ENCOUNTER — Other Ambulatory Visit: Payer: Self-pay | Admitting: Neurosurgery

## 2014-02-15 DIAGNOSIS — M5010 Cervical disc disorder with radiculopathy, unspecified cervical region: Secondary | ICD-10-CM

## 2014-02-19 ENCOUNTER — Other Ambulatory Visit: Payer: Self-pay | Admitting: Cardiology

## 2014-02-21 ENCOUNTER — Ambulatory Visit
Admission: RE | Admit: 2014-02-21 | Discharge: 2014-02-21 | Disposition: A | Discharge status: Home / Self Care | Payer: Medicare Other | Source: Ambulatory Visit | Attending: Neurosurgery | Admitting: Neurosurgery

## 2014-02-21 DIAGNOSIS — M5010 Cervical disc disorder with radiculopathy, unspecified cervical region: Secondary | ICD-10-CM

## 2014-02-21 MED ORDER — DEXAMETHASONE SODIUM PHOSPHATE 4 MG/ML IJ SOLN
4.0000 mg | Freq: Once | INTRAMUSCULAR | Status: AC
  Administered 2014-02-21: 4 mg via INTRA_ARTICULAR

## 2014-02-21 MED ORDER — IOHEXOL 300 MG/ML  SOLN
1.0000 mL | Freq: Once | INTRAMUSCULAR | Status: AC | PRN
  Administered 2014-02-21: 1 mL via INTRA_ARTICULAR

## 2014-02-21 NOTE — Discharge Instructions (Addendum)

## 2014-03-20 ENCOUNTER — Other Ambulatory Visit: Payer: Self-pay | Admitting: Cardiology

## 2014-03-21 ENCOUNTER — Other Ambulatory Visit: Payer: Self-pay | Admitting: Cardiology

## 2014-04-18 ENCOUNTER — Other Ambulatory Visit: Payer: Self-pay | Admitting: Internal Medicine

## 2014-05-09 ENCOUNTER — Encounter: Payer: Self-pay | Admitting: Internal Medicine

## 2014-06-01 ENCOUNTER — Ambulatory Visit (INDEPENDENT_AMBULATORY_CARE_PROVIDER_SITE_OTHER): Payer: Medicare Other | Admitting: Internal Medicine

## 2014-06-01 ENCOUNTER — Other Ambulatory Visit (INDEPENDENT_AMBULATORY_CARE_PROVIDER_SITE_OTHER): Payer: Medicare Other

## 2014-06-01 ENCOUNTER — Encounter: Payer: Self-pay | Admitting: Internal Medicine

## 2014-06-01 VITALS — BP 125/62 | HR 59 | Temp 97.6°F | Wt 183.0 lb

## 2014-06-01 DIAGNOSIS — M51379 Other intervertebral disc degeneration, lumbosacral region without mention of lumbar back pain or lower extremity pain: Secondary | ICD-10-CM

## 2014-06-01 DIAGNOSIS — M5137 Other intervertebral disc degeneration, lumbosacral region: Secondary | ICD-10-CM

## 2014-06-01 DIAGNOSIS — M2550 Pain in unspecified joint: Secondary | ICD-10-CM

## 2014-06-01 DIAGNOSIS — M5136 Other intervertebral disc degeneration, lumbar region: Secondary | ICD-10-CM

## 2014-06-01 LAB — RHEUMATOID FACTOR: Rhuematoid fact SerPl-aCnc: <10 IU/mL (ref <=14)

## 2014-06-01 LAB — SEDIMENTATION RATE: Sed Rate: 19 mm/hr (ref 0–22)

## 2014-06-01 NOTE — Patient Instructions (Signed)
Your next office appointment will be determined based upon review of your pending labs . Those instructions will be transmitted to you through My Chart    Followup as needed for your acute issue. Please report any significant change in your symptoms. The best exercises for the low back include freestyle swimming, stretch aerobics, and yoga.Cybex & Nautilus machines rather than dead weights are better for the back.

## 2014-06-01 NOTE — Progress Notes (Signed)
Pre visit review using our clinic review tool, if applicable. No additional management support is needed unless otherwise documented below in the visit note. 

## 2014-06-01 NOTE — Progress Notes (Signed)
   Subjective:    Patient ID: Bridget Lane, female    DOB: [birth year], 80 y.o.   MRN: [id number]  HPI  Her neurosurgeon has diagnosed degenerative disc disease of the lumbar spine based on MRI findings. He also referred her on to an interventional radiologist for intra-articular injections of the right and left shoulders. The oneinjection in the right shoulder was of benefit. She's had 2 injections on the left without improvement. He has also recommended injection the left hip as she continues to have pain there as well.  She has pain in the hip which radiates to the inguinal area. She also pain that radiates down the left lower extremity along the lateral aspect.  She describes some swelling and joint stiffness associated with this as well as some myalgias. This is mainly in the legs. She describes the knees as being weak with some numbness. She states she must center herself before beginning to ambulate.  She's also had some joint symptoms in her elbows and hands.    Review of Systems Fever, chills, sweats, or unexplained weight loss not present. No loss of control of bladder or bowels.        Objective:   Physical Exam Positive or pertinent findings include: There is marked decreased range of motion of cervical spine. She has dramatic DIP osteoarthritic changes in hands There is accentuation of the mid to upper thoracic spine She has minor crepitus of knees, left greater than the right.  General appearance :adequately nourished; in no distress. Eyes: No conjunctival inflammation or scleral icterus is present. Heart:  Normal rate and regular rhythm. S1 and S2 normal without gallop, murmur, click, rub or other extra sounds  Lungs:Chest clear to auscultation; no wheezes, rhonchi,rales ,or rubs present.No increased work of breathing.  Abdomen: bowel sounds normal, soft and non-tender without masses, organomegaly or hernias noted.  No guarding or rebound. No flank tenderness to  percussion. Skin:Warm & dry.  Intact without suspicious lesions or rashes ; no jaundice or tenting Lymphatic: No lymphadenopathy is noted about the head, neck, axilla          Assessment & Plan:  #1 degenerative disc disease of the lumbosacral spine with radicular pain  #2 polyarthralgia, doubt rheumatoid disease  Plan :see orders and recommendations

## 2014-06-02 ENCOUNTER — Encounter: Payer: Self-pay | Admitting: Gastroenterology

## 2014-06-02 ENCOUNTER — Other Ambulatory Visit: Payer: Self-pay | Admitting: Cardiovascular Disease

## 2014-06-02 ENCOUNTER — Encounter: Payer: Self-pay | Admitting: Internal Medicine

## 2014-06-02 LAB — CYCLIC CITRUL PEPTIDE ANTIBODY, IGG

## 2014-06-07 ENCOUNTER — Ambulatory Visit (INDEPENDENT_AMBULATORY_CARE_PROVIDER_SITE_OTHER): Payer: Medicare Other | Admitting: Internal Medicine

## 2014-06-07 ENCOUNTER — Encounter: Payer: Self-pay | Admitting: Internal Medicine

## 2014-06-07 ENCOUNTER — Other Ambulatory Visit (INDEPENDENT_AMBULATORY_CARE_PROVIDER_SITE_OTHER): Payer: Medicare Other

## 2014-06-07 VITALS — BP 130/70 | HR 63 | Temp 98.2°F | Ht 65.0 in | Wt 181.5 lb

## 2014-06-07 DIAGNOSIS — R7309 Other abnormal glucose: Secondary | ICD-10-CM

## 2014-06-07 DIAGNOSIS — E785 Hyperlipidemia, unspecified: Secondary | ICD-10-CM

## 2014-06-07 DIAGNOSIS — R739 Hyperglycemia, unspecified: Secondary | ICD-10-CM | POA: Insufficient documentation

## 2014-06-07 DIAGNOSIS — E042 Nontoxic multinodular goiter: Secondary | ICD-10-CM

## 2014-06-07 LAB — TSH: TSH: 0.45 u[IU]/mL (ref 0.35–4.50)

## 2014-06-07 LAB — HEMOGLOBIN A1C: HEMOGLOBIN A1C: 6 % (ref 4.6–6.5)

## 2014-06-07 LAB — HEPATIC FUNCTION PANEL
ALT: 18 U/L (ref 0–35)
AST: 18 U/L (ref 0–37)
Albumin: 4 g/dL (ref 3.5–5.2)
Alkaline Phosphatase: 62 U/L (ref 39–117)
BILIRUBIN DIRECT: 0.2 mg/dL (ref 0.0–0.3)
BILIRUBIN TOTAL: 0.9 mg/dL (ref 0.2–1.2)
Total Protein: 7.4 g/dL (ref 6.0–8.3)

## 2014-06-07 LAB — LIPID PANEL
CHOL/HDL RATIO: 4
Cholesterol: 200 mg/dL (ref 0–200)
HDL: 47.5 mg/dL (ref >39.00)
LDL CALC: 113 mg/dL — AB (ref 0–99)
NONHDL: 152.5
Triglycerides: 199 mg/dL — ABNORMAL HIGH (ref 0.0–149.0)
VLDL: 39.8 mg/dL (ref 0.0–40.0)

## 2014-06-07 NOTE — Progress Notes (Signed)
Pre visit review using our clinic review tool, if applicable. No additional management support is needed unless otherwise documented below in the visit note. 

## 2014-06-07 NOTE — Assessment & Plan Note (Signed)
TSH 

## 2014-06-07 NOTE — Assessment & Plan Note (Signed)
A1c

## 2014-06-07 NOTE — Progress Notes (Signed)
   Subjective:    Patient ID: Bridget Lane, female    DOB: August 14, 1933, 80 y.o.   MRN: [id number]  HPI She is here to assess active health issues & conditions.  PMH, FH, & Social history verified & updated.   A modified  heart healthy diet is followed;but she eats increased sweets. Exercise encompasses > 3  times per week as walking 2 miles .She does have some exertional dyspnea but no chest pain with exertion.  Her weight is up 20 pounds.She has had elevated random glucoses in the past. Her mother, sister and 2 maternal aunts had diabetes.     Family history is negative for premature coronary disease. Advanced cholesterol testing reveals  LDL goal is less than 100 ; ideally < 70 . To date no statin.  Low dose ASA not  Taken.           Review of Systems She will have decreased sensation in toes intermittently at times but denies frank burning, numbness, or tingling in extremities She has no polydipsia, polyphagia, polyuria. She has no nonhealing skin lesions. Specifically denied are  chest pain, palpitations, or claudication.  Significant abdominal symptoms, memory deficit, or myalgias not present. No significant  fatigue; sleep disorder; change in appetite. No blurred, double vision ,loss of vision No constipation; diarrhea;hoarseness;dysphagia No change in nails,hair,skin No temperature intolerance to heat ,cold     Objective:   Physical Exam  Positive or pertinent findings include: She has an upper partial. There is wax in the left otic canal, but  hearing is normal bilaterally. She has decreased range of motion of the cervical spine. There's enlargement of the right thyroid lobe. No definitive nodules. There is marked curvature of the thoracic spine. She has mixed DIP and PIP joint changes; these are greatest at the DIP joints. Hammertoes are present. She has slight crepitus of knees without effusions.  General appearance :adequately nourished; in no  distress. Eyes: No conjunctival inflammation or scleral icterus is present. Oral exam: Lips and gums are healthy appearing.There is no oropharyngeal erythema or exudate noted.  Heart:  Normal rate and regular rhythm. S1 and S2 normal without gallop, murmur, click, rub or other extra sounds   Lungs:Chest clear to auscultation; no wheezes, rhonchi,rales ,or rubs present.No increased work of breathing.  Abdomen: bowel sounds normal, soft and non-tender without masses, organomegaly or hernias noted.  No guarding or rebound.  Skin:Warm & dry.  Intact without suspicious lesions or rashes ; no jaundice or tenting Lymphatic: No lymphadenopathy is noted about the head, neck, axilla             Assessment & Plan:  See Current Assessment & Plan in Problem List under specific Diagnosis

## 2014-06-07 NOTE — Assessment & Plan Note (Signed)
Lipids, LFTs, TSH  

## 2014-06-07 NOTE — Patient Instructions (Signed)
Your next office appointment will be determined based upon review of your pending labs. Those instructions will be transmitted to you through My Chart . 

## 2014-06-10 ENCOUNTER — Other Ambulatory Visit: Payer: Self-pay | Admitting: Neurosurgery

## 2014-06-10 ENCOUNTER — Ambulatory Visit (INDEPENDENT_AMBULATORY_CARE_PROVIDER_SITE_OTHER): Payer: Medicare Other | Admitting: Cardiology

## 2014-06-10 ENCOUNTER — Encounter: Payer: Self-pay | Admitting: Cardiology

## 2014-06-10 VITALS — BP 124/80 | HR 61 | Ht 65.0 in | Wt 182.0 lb

## 2014-06-10 DIAGNOSIS — I480 Paroxysmal atrial fibrillation: Secondary | ICD-10-CM

## 2014-06-10 DIAGNOSIS — I4891 Unspecified atrial fibrillation: Secondary | ICD-10-CM

## 2014-06-10 DIAGNOSIS — M5010 Cervical disc disorder with radiculopathy, unspecified cervical region: Secondary | ICD-10-CM

## 2014-06-10 NOTE — Patient Instructions (Signed)
Your physician recommends that you return for lab work in:  September --BMET/CBCd  Your physician wants you to follow-up in: 1 year with Dr Aundra Dubin. (August 2016). You will receive a reminder letter in the mail two months in advance. If you don't receive a letter, please call our office to schedule the follow-up appointment.

## 2014-06-11 NOTE — Progress Notes (Signed)
Patient ID: Bridget Lane, female   DOB: 11-06-1932, 80 y.o.   MRN: [id number] PCP: Dr. Linna Darner  80 yo with history of paroxysmal atrial fibrillation presents for followup. She has a history of paroxysmal atrial fibrillation and is on apixaban and flecainide.  No recent palpitations.  She has been taking metoprolol with no problems (prior history of bradycardia).  Today, she is in NSR.  No exertional dyspnea.  No exertional chest pain.  No palpitations.  No rectal bleeding or melena on Eliquis.   ECG: NSR, normal  Labs (4/14): Flecainide level normal, K 4.1, creatinine 0.8 Labs (10/14): LFTs normal, TSH normal, HCT 38.8 Labs (3/15): K 4.4, creatinine 1.1, HCT 37.4 Labs (8/15): LDL 143, HDL 47  PMH: 1. BPPV 2. OA 3. GERD 4. Hyperlipidemia 5. Paroxysmal atrial fibrillation 6. Multinodular goiter 7. Cholecystectomy 8. Appendectomy 9. Echo (7/13) with EF 55-60% 10. LHC/RHC (7/13) with mild nonobstructive CAD, LAD bridging; mean RA 4, PA 28/7, mean PCWP 7. 11. Esophageal dysmotility.  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 acetaminophen (TYLENOL) 325 MG tablet Take 650 mg by mouth. 2 by mouth two times daily      . ELIQUIS 5 MG TABS tablet TAKE 1 TABLE BY MOUTH TWICE DAILY  60 tablet  0  . flecainide (TAMBOCOR) 100 MG tablet TAKE 1 TABLET (100 MG TOTAL) BY MOUTH 2 (TWO) TIMES DAILY.  60 tablet  9  . Melatonin 3 MG TABS Take 3 mg by mouth at bedtime.       . metoprolol tartrate (LOPRESSOR) 25 MG tablet TAKE 1/2 TABLET BY MOUTH TWICE A DAY  30 tablet  6  . Multiple Vitamin (MULTIVITAMIN) tablet Take 1 tablet by mouth daily.      Marland Kitchen omeprazole (PRILOSEC) 40 MG capsule TAKE ONE CAPSULE BY MOUTH TWICE A DAY  60 capsule  1   No current facility-administered medications for this visit.   BP 124/80  Pulse 61  Ht 5\' 5"  (1.651 m)  Wt 82.555 kg (182 lb)  BMI 30.29  kg/m2 General: NAD Neck: No JVD, no thyromegaly or thyroid nodule.  Lungs: Clear to auscultation bilaterally with normal respiratory effort. CV: Nondisplaced PMI.  Heart regular S1/S2, no S3/S4, no murmur.  No peripheral edema.  No carotid bruit.  Normal pedal pulses.  Abdomen: Soft, nontender, no hepatosplenomegaly, no distention.  Skin: Intact without lesions or rashes.  Neurologic: Alert and oriented x 3.  Psych: Normal affect. Extremities: No clubbing or cyanosis.   Assessment/Plan: Atrial fibrillation: Paroxysmal.  EF was normal on 7/13 echo. Maintaining NSR on flecainide and tolerating metoprolol without significant bradycardia.  She had mild disease only on left heart cath in 2013.   - Continue flecainide and metoprolol.  - Continue apixaban.  Will be due for CBC and BMET in 9/15.   - BMET today.     Loralie Champagne 06/11/2014

## 2014-06-13 NOTE — Addendum Note (Signed)
Addended by: Katrine Coho on: 06/13/2014 08:24 AM   Modules accepted: Orders

## 2014-06-14 ENCOUNTER — Ambulatory Visit
Admission: RE | Admit: 2014-06-14 | Discharge: 2014-06-14 | Disposition: A | Discharge status: Home / Self Care | Payer: Medicare Other | Source: Ambulatory Visit | Attending: Neurosurgery | Admitting: Neurosurgery

## 2014-06-14 VITALS — BP 134/58 | HR 55

## 2014-06-14 DIAGNOSIS — M5010 Cervical disc disorder with radiculopathy, unspecified cervical region: Secondary | ICD-10-CM

## 2014-06-14 DIAGNOSIS — M1990 Unspecified osteoarthritis, unspecified site: Secondary | ICD-10-CM

## 2014-06-14 MED ORDER — IOHEXOL 300 MG/ML  SOLN
1.0000 mL | Freq: Once | INTRAMUSCULAR | Status: AC | PRN
  Administered 2014-06-14: 1 mL via EPIDURAL

## 2014-06-14 MED ORDER — TRIAMCINOLONE ACETONIDE 40 MG/ML IJ SUSP (RADIOLOGY)
60.0000 mg | Freq: Once | INTRAMUSCULAR | Status: AC
  Administered 2014-06-14: 60 mg via EPIDURAL

## 2014-06-14 NOTE — Progress Notes (Signed)
Patient states she has been off Eliquis for at least the past two days.  jkl

## 2014-07-06 ENCOUNTER — Other Ambulatory Visit: Payer: Self-pay | Admitting: Neurosurgery

## 2014-07-06 ENCOUNTER — Ambulatory Visit (INDEPENDENT_AMBULATORY_CARE_PROVIDER_SITE_OTHER): Payer: Medicare Other | Admitting: Gastroenterology

## 2014-07-06 ENCOUNTER — Telehealth: Payer: Self-pay | Admitting: *Deleted

## 2014-07-06 ENCOUNTER — Encounter: Payer: Self-pay | Admitting: Gastroenterology

## 2014-07-06 VITALS — BP 110/58 | HR 56 | Wt 177.2 lb

## 2014-07-06 DIAGNOSIS — M5010 Cervical disc disorder with radiculopathy, unspecified cervical region: Secondary | ICD-10-CM

## 2014-07-06 DIAGNOSIS — Z800 Family history of malignant neoplasm of digestive organs: Secondary | ICD-10-CM

## 2014-07-06 DIAGNOSIS — K625 Hemorrhage of anus and rectum: Secondary | ICD-10-CM

## 2014-07-06 DIAGNOSIS — Z8619 Personal history of other infectious and parasitic diseases: Secondary | ICD-10-CM | POA: Insufficient documentation

## 2014-07-06 DIAGNOSIS — R197 Diarrhea, unspecified: Secondary | ICD-10-CM

## 2014-07-06 HISTORY — DX: Hemorrhage of anus and rectum: K62.5

## 2014-07-06 MED ORDER — MOVIPREP 100 G PO SOLR: 1.0000 | Freq: Once | ORAL | Status: DC

## 2014-07-06 NOTE — Patient Instructions (Addendum)
You have been scheduled for a colonoscopy with Dr. Olevia Perches. Please follow written instructions given to you at your visit today.  Please pick up your prep kit at the pharmacy within the next 1-3 days. If you use inhalers (even only as needed), please bring them with you on the day of your procedure. Your physician has requested that you go to www.startemmi.com and enter the access code given to you at your visit today. This web site gives a general overview about your procedure. However, you should still follow specific instructions given to you by our office regarding your preparation for the procedure.  You will be contacted by our office prior to your procedure for directions on holding your Eliquis.  If you do not hear from our office 1 week prior to your scheduled procedure, please call [phone] to discuss.   Your physician has requested that you go to the basement for the following lab work before leaving today: H-Pylori Stool

## 2014-07-06 NOTE — Progress Notes (Signed)
     07/06/2014 Bridget Lane [id number] 17-Oct-1933   History of Present Illness:  This is a 80 year old female who is a patient of Dr. Nichola Sizer.  She is here to discuss recall colonoscopy.  Her father had colon cancer.  The patient's last colonoscopy was in 06/2009 at which time the study was normal except for internal hemorrhoids.  The patient is on Eliquis, which is prescribed by Dr. Aundra Dubin.  She says that she has held it for procedures in the past.  She also mentions that she has diarrhea that is an ongoing issue.  She stated that she is upset that she is not seeing the doctor today and doesn't really need to talk to me about the issues since I know nothing about it.  She then did proceed to tell me that she has diarrhea with urgency after eating certain foods such as salads, etc, and that this issue has been going on for a long time.  She does see bright red blood on the toilet paper when she has forceful, explosive diarrhea.  Has lower abdominal cramping and bloating with it as well.  Says that her daughter has similar issues.  She is also concerned about "Hpylorus" because she had it in the past and was treated but it was never checked to see if she got rid of it.  She is concerned about it since she still has the same problems that she had in the past when she was told that she had the "Hpylorus".   Current Medications, Allergies, Past Medical History, Past Surgical History, Family History and Social History were reviewed in [employer] record.   Physical Exam: BP 110/58  Pulse 56  Wt 177 lb 3.2 oz (80.377 kg) General: Well developed white female in no acute distress Head: Normocephalic and atraumatic Eyes:  Sclerae anicteric, conjunctiva pink  Ears: Normal auditory acuity Lungs: Clear throughout to auscultation Heart: Regular rate and rhythm Abdomen: Soft, non-distended.  Normal bowel sounds.  Mild diffuse TTP without R/R/G. Rectal:  Deferred.  Will be done  at the time of colonoscopy. Musculoskeletal: Symmetrical with no gross deformities  Extremities: No edema  Neurological: Alert oriented x 4, grossly non-focal Psychological:  Alert and cooperative. Normal mood and affect  Assessment and Recommendations: -Family history of colon cancer in her father:  Due for surveillance colonoscopy.  Will schedule with Dr. Olevia Perches.  The risks, benefits, and alternatives were discussed with the patient and she consents to proceed.  The risks benefits and alternatives to a temporary hold of anti-coagulants/anti-platelets for the procedure were discussed with the patient she consents to proceed. Obtain clearance from Dr. Aundra Dubin for ok to hold Eliquis. -Diarrhea and rectal bleeding:  Rectal bleeding is likely outlet bleeding.  Diarrhea seems to be a long-standing issue and she likely has IBS, but will see if Dr. Olevia Perches can perform random colon biopsies to rule out microscopic colitis. -History of Hpylori:  Patient was treated in the past but says that it was never followed up to see if it was eradicated.  Will check stool for Hpylori Ag.

## 2014-07-06 NOTE — Progress Notes (Signed)
Reviewed and agree, also agree with rechecking stool for H.Pylori antigen

## 2014-07-06 NOTE — Telephone Encounter (Signed)
07/06/2014   RE: Bridget Lane DOB: 07-18-1933 MRN: [id number]   Dear Dr. Aundra Dubin,    We have scheduled the above patient for an Colonoscopy. Our records show that she is on anticoagulation therapy.   Please advise as to how long the patient may come off her therapy of Eliquis prior to the procedure, which is scheduled for 08-03-2014.  Please fax back/ or route the completed form to Evette Georges., CMA   Sincerely,    [name]

## 2014-07-11 ENCOUNTER — Other Ambulatory Visit (INDEPENDENT_AMBULATORY_CARE_PROVIDER_SITE_OTHER): Payer: Medicare Other

## 2014-07-11 ENCOUNTER — Telehealth: Payer: Self-pay | Admitting: *Deleted

## 2014-07-11 ENCOUNTER — Other Ambulatory Visit: Payer: Medicare Other

## 2014-07-11 ENCOUNTER — Other Ambulatory Visit: Payer: Self-pay | Admitting: Gastroenterology

## 2014-07-11 DIAGNOSIS — I4891 Unspecified atrial fibrillation: Secondary | ICD-10-CM

## 2014-07-11 DIAGNOSIS — K625 Hemorrhage of anus and rectum: Secondary | ICD-10-CM

## 2014-07-11 DIAGNOSIS — Z8619 Personal history of other infectious and parasitic diseases: Secondary | ICD-10-CM

## 2014-07-11 DIAGNOSIS — R197 Diarrhea, unspecified: Secondary | ICD-10-CM

## 2014-07-11 DIAGNOSIS — Z800 Family history of malignant neoplasm of digestive organs: Secondary | ICD-10-CM

## 2014-07-11 DIAGNOSIS — I480 Paroxysmal atrial fibrillation: Secondary | ICD-10-CM

## 2014-07-11 LAB — BASIC METABOLIC PANEL
BUN: 18 mg/dL (ref 6–23)
CALCIUM: 9.4 mg/dL (ref 8.4–10.5)
CHLORIDE: 107 meq/L (ref 96–112)
CO2: 30 mEq/L (ref 19–32)
Creatinine, Ser: 1.1 mg/dL (ref 0.4–1.2)
GFR: 51.44 mL/min — ABNORMAL LOW (ref >60.00)
Glucose, Bld: 88 mg/dL (ref 70–99)
Potassium: 4.1 mEq/L (ref 3.5–5.1)
Sodium: 141 mEq/L (ref 135–145)

## 2014-07-11 LAB — CBC WITH DIFFERENTIAL/PLATELET
BASOS PCT: 0.5 % (ref 0.0–3.0)
Basophils Absolute: 0 10*3/uL (ref 0.0–0.1)
Eosinophils Absolute: 0.1 10*3/uL (ref 0.0–0.7)
Eosinophils Relative: 2 % (ref 0.0–5.0)
HCT: 37.6 % (ref 36.0–46.0)
HEMOGLOBIN: 12.6 g/dL (ref 12.0–15.0)
LYMPHS PCT: 36.7 % (ref 12.0–46.0)
Lymphs Abs: 2.2 10*3/uL (ref 0.7–4.0)
MCHC: 33.6 g/dL (ref 30.0–36.0)
MCV: 85.7 fl (ref 78.0–100.0)
MONOS PCT: 6.8 % (ref 3.0–12.0)
Monocytes Absolute: 0.4 10*3/uL (ref 0.1–1.0)
Neutro Abs: 3.3 10*3/uL (ref 1.4–7.7)
Neutrophils Relative %: 54 % (ref 43.0–77.0)
Platelets: 287 10*3/uL (ref 150.0–400.0)
RBC: 4.39 Mil/uL (ref 3.87–5.11)
RDW: 14.7 % (ref 11.5–15.5)
WBC: 6.1 10*3/uL (ref 4.0–10.5)

## 2014-07-11 NOTE — Telephone Encounter (Signed)
Patient notified that per Dr. Aundra Dubin okay to hold Eliquis 48 hrs before procedure. Patient verbalized understanding.

## 2014-07-11 NOTE — Telephone Encounter (Signed)
May hold Eliquis for colonoscopy. ----- Message ----- From: Carlyle Dolly, CMA Sent: 07/06/2014 10:00 AM : Larey Dresser, MD  ______________________________________________________________________________________________________________________________________________  07/06/2014   RE: Bridget Lane DOB: 01/12/1933 MRN: [id number]   Dear Dr. Aundra Dubin,    We have scheduled the above patient for an Colonoscopy. Our records show that she is on anticoagulation therapy.   Please advise as to how long the patient may come off her therapy of Eliquis prior to the procedure, which is scheduled for 08-03-2014.  Please fax back/ or route the completed form to Evette Georges., CMA   Sincerely,    [name]

## 2014-07-12 ENCOUNTER — Other Ambulatory Visit: Payer: Self-pay | Admitting: Cardiology

## 2014-07-12 ENCOUNTER — Other Ambulatory Visit: Payer: Self-pay | Admitting: Internal Medicine

## 2014-07-12 LAB — HELICOBACTER PYLORI  SPECIAL ANTIGEN: H. PYLORI Antigen: NEGATIVE

## 2014-07-13 ENCOUNTER — Ambulatory Visit
Admission: RE | Admit: 2014-07-13 | Discharge: 2014-07-13 | Disposition: A | Discharge status: Home / Self Care | Payer: Medicare Other | Source: Ambulatory Visit | Attending: Neurosurgery | Admitting: Neurosurgery

## 2014-07-13 ENCOUNTER — Encounter: Payer: Self-pay | Admitting: Internal Medicine

## 2014-07-13 VITALS — BP 122/65 | HR 54

## 2014-07-13 DIAGNOSIS — M5010 Cervical disc disorder with radiculopathy, unspecified cervical region: Secondary | ICD-10-CM

## 2014-07-13 MED ORDER — IOHEXOL 300 MG/ML  SOLN
1.0000 mL | Freq: Once | INTRAMUSCULAR | Status: AC | PRN
  Administered 2014-07-13: 1 mL via EPIDURAL

## 2014-07-13 MED ORDER — TRIAMCINOLONE ACETONIDE 40 MG/ML IJ SUSP (RADIOLOGY)
60.0000 mg | Freq: Once | INTRAMUSCULAR | Status: AC
  Administered 2014-07-13: 60 mg via EPIDURAL

## 2014-07-26 ENCOUNTER — Telehealth: Payer: Self-pay | Admitting: *Deleted

## 2014-07-26 NOTE — Telephone Encounter (Signed)
Left a message for patient to call back to discuss procedure and blood thinners.

## 2014-07-27 NOTE — Telephone Encounter (Signed)
Spoke with patient and told her per Dr. Aundra Dubin, she can hold Eliquis 48 hour prior to procedure.

## 2014-08-03 ENCOUNTER — Encounter: Payer: Self-pay | Admitting: Internal Medicine

## 2014-08-03 ENCOUNTER — Ambulatory Visit (AMBULATORY_SURGERY_CENTER): Payer: Medicare Other | Admitting: Internal Medicine

## 2014-08-03 VITALS — BP 135/54 | HR 51 | Temp 98.0°F | Resp 12 | Ht 65.0 in | Wt 177.0 lb

## 2014-08-03 DIAGNOSIS — D120 Benign neoplasm of cecum: Secondary | ICD-10-CM

## 2014-08-03 DIAGNOSIS — Z1211 Encounter for screening for malignant neoplasm of colon: Secondary | ICD-10-CM

## 2014-08-03 DIAGNOSIS — D122 Benign neoplasm of ascending colon: Secondary | ICD-10-CM

## 2014-08-03 DIAGNOSIS — Z800 Family history of malignant neoplasm of digestive organs: Secondary | ICD-10-CM

## 2014-08-03 MED ORDER — SODIUM CHLORIDE 0.9 % IV SOLN: 500.0000 mL | INTRAVENOUS | Status: DC

## 2014-08-03 NOTE — Patient Instructions (Addendum)
YOU HAD AN ENDOSCOPIC PROCEDURE TODAY AT THE [facility]: Refer to the procedure report that was given to you for any specific questions about what was found during the examination.  If the procedure report does not answer your questions, please call your gastroenterologist to clarify.  If you requested that your care partner not be given the details of your procedure findings, then the procedure report has been included in a sealed envelope for you to review at your convenience later.  YOU SHOULD EXPECT: Some feelings of bloating in the abdomen. Passage of more gas than usual.  Walking can help get rid of the air that was put into your GI tract during the procedure and reduce the bloating. If you had a lower endoscopy (such as a colonoscopy or flexible sigmoidoscopy) you may notice spotting of blood in your stool or on the toilet paper. If you underwent a bowel prep for your procedure, then you may not have a normal bowel movement for a few days.  DIET: Your first meal following the procedure should be a light meal and then it is ok to progress to your normal diet.  A half-sandwich or bowl of soup is an example of a good first meal.  Heavy or fried foods are harder to digest and may make you feel nauseous or bloated.  Likewise meals heavy in dairy and vegetables can cause extra gas to form and this can also increase the bloating.  Drink plenty of fluids but you should avoid alcoholic beverages for 24 hours.  ACTIVITY: Your care partner should take you home directly after the procedure.  You should plan to take it easy, moving slowly for the rest of the day.  You can resume normal activity the day after the procedure however you should NOT DRIVE or use heavy machinery for 24 hours (because of the sedation medicines used during the test).    SYMPTOMS TO REPORT IMMEDIATELY: A gastroenterologist can be reached at any hour.  During normal business hours, 8:30 AM to 5:00 PM Monday through Friday,  call [phone].  After hours and on weekends, please call the GI answering service at [phone] who will take a message and have the physician on call contact you.   Following lower endoscopy (colonoscopy or flexible sigmoidoscopy):  Excessive amounts of blood in the stool  Significant tenderness or worsening of abdominal pains  Swelling of the abdomen that is new, acute  Fever of 100F or higher    FOLLOW UP: If any biopsies were taken you will be contacted by phone or by letter within the next 1-3 weeks.  Call your gastroenterologist if you have not heard about the biopsies in 3 weeks.  Our staff will call the home number listed on your records the next business day following your procedure to check on you and address any questions or concerns that you may have at that time regarding the information given to you following your procedure. This is a courtesy call and so if there is no answer at the home number and we have not heard from you through the emergency physician on call, we will assume that you have returned to your regular daily activities without incident.  SIGNATURES/CONFIDENTIALITY: You and/or your care partner have signed paperwork which will be entered into your electronic medical record.  These signatures attest to the fact that that the information above on your After Visit Summary has been reviewed and is understood.  Full responsibility of the confidentiality   of this discharge information lies with you and/or your care-partner.   INFORMATION ON POLYPS  AND HEMORRHOIDS GIVEN TO YOU TODAY  AWAIT PATHOLOGY RESULTS  HOLD ANTICOAGULANTS FOR 2 WEEKS (ELIQUIS)  HIGH FIBER DIET

## 2014-08-03 NOTE — Op Note (Signed)
[place]  [address] [place], [id number]   COLONOSCOPY PROCEDURE REPORT  PATIENT: Bridget Lane, Bridget Lane  MR#: [id number] BIRTHDATE: [birth year] , 49  yrs. old GENDER: female ENDOSCOPIST: Lafayette Dragon, MD REFERRED LF:[id number] Linna Darner, M.D. PROCEDURE DATE:  08/03/2014 PROCEDURE:   Colonoscopy with snare polypectomy and Colonoscopy with cold biopsy polypectomy First Screening Colonoscopy - Avg.  risk and is 50 yrs.  old or older - No.  Prior Negative Screening - Now for repeat screening. Less than 10 yrs Prior Negative Screening - Now for repeat screening.  Above average risk  History of Adenoma - Now for follow-up colonoscopy & has been > or = to 3 yrs.  N/A  Polyps Removed Today? Yes. ASA CLASS:   Class II INDICATIONS:family history of colon cancer in patient's father. Prior colonoscopy in 2005 in 2010, she has been on anticoagulants.  MEDICATIONS: Monitored anesthesia care and Propofol 340 mg IV  DESCRIPTION OF PROCEDURE:   After the risks benefits and alternatives of the procedure were thoroughly explained, informed consent was obtained.  The digital rectal exam revealed no abnormalities of the rectum.   The LB PCF Q180 [id number]  endoscope was introduced through the anus and advanced to the cecum, which was identified by both the appendix and ileocecal valve. No adverse events experienced.   The quality of the prep was good, using MoviPrep  The instrument was then slowly withdrawn as the colon was fully examined.      COLON FINDINGS: Five polypoid shaped sessile polyps measuring 6 mm- 9 mm in size were found at the cecum and in the ascending colon.  A polypectomy was performed with cold forceps.  The resection was complete, the polyp tissue was completely retrieved and sent to histology.  A polypectomy was performed with a cold snare.  The resection was complete, the polyp tissue was completely retrieved and sent to histology.  Retroflexed views revealed  no abnormalities. The time to cecum=15 minutes 27 seconds.  Withdrawal time=16 minutes 05 seconds.  The scope was withdrawn and the procedure completed. COMPLICATIONS: There were no immediate complications.  ENDOSCOPIC IMPRESSION:  Five sessile polyps were found at the cecum x2 and in the ascending colon x3 ; polypectomy was performed with cold forceps x1 in ascending colon polyp  and  polypectomy was performed with a cold snare x 4, Size of the polyp from 6-9 mm  small internal nonbleeding hemorrhoids  RECOMMENDATIONS: 1.  Await pathology results 2.  Hold anticoagulants for 2 weeks 3.  High fiber diet Recall colonoscopy pending path report ,probably 5 years  eSigned:  Lafayette Dragon, MD 08/03/2014 5:43 PM   cc:   PATIENT NAME:  Bridget Lane, Bridget Lane MR#: [id number]

## 2014-08-03 NOTE — Progress Notes (Signed)
Called to room to assist during endoscopic procedure.  Patient ID and intended procedure confirmed with present staff. Received instructions for my participation in the procedure from the performing physician.  

## 2014-08-03 NOTE — Progress Notes (Signed)
Report to PACU, RN, vss, BBS= Clear.  

## 2014-08-04 ENCOUNTER — Telehealth: Payer: Self-pay | Admitting: *Deleted

## 2014-08-04 NOTE — Telephone Encounter (Signed)
  Follow up Call-  Call back number 08/03/2014  Post procedure Call Back phone  # [phone]  Permission to leave phone message Yes     Patient questions:  Do you have a fever, pain , or abdominal swelling? No. Pain Score  0 *  Have you tolerated food without any problems? Yes.    Have you been able to return to your normal activities? Yes.    Do you have any questions about your discharge instructions: Diet   No. Medications  No. Follow up visit  No.  Do you have questions or concerns about your Care? No.  Actions: * If pain score is 4 or above: No action needed, pain <4.

## 2014-08-09 ENCOUNTER — Encounter: Payer: Self-pay | Admitting: Internal Medicine

## 2014-09-10 ENCOUNTER — Other Ambulatory Visit: Payer: Self-pay | Admitting: Internal Medicine

## 2014-09-20 ENCOUNTER — Other Ambulatory Visit: Payer: Self-pay

## 2014-09-20 MED ORDER — FLECAINIDE ACETATE 100 MG PO TABS: ORAL_TABLET | ORAL | Status: DC

## 2014-09-20 MED ORDER — METOPROLOL TARTRATE 25 MG PO TABS: 12.5000 mg | ORAL_TABLET | Freq: Two times a day (BID) | ORAL | Status: DC

## 2014-09-26 ENCOUNTER — Other Ambulatory Visit: Payer: Self-pay | Admitting: Internal Medicine

## 2014-09-29 ENCOUNTER — Encounter (HOSPITAL_COMMUNITY): Payer: Self-pay | Admitting: Internal Medicine

## 2014-09-30 ENCOUNTER — Ambulatory Visit (INDEPENDENT_AMBULATORY_CARE_PROVIDER_SITE_OTHER): Payer: Medicare Other | Admitting: Family Medicine

## 2014-09-30 ENCOUNTER — Encounter: Payer: Self-pay | Admitting: Family Medicine

## 2014-09-30 VITALS — BP 110/72 | HR 62 | Temp 97.9°F | Wt 173.0 lb

## 2014-09-30 DIAGNOSIS — K219 Gastro-esophageal reflux disease without esophagitis: Secondary | ICD-10-CM

## 2014-09-30 DIAGNOSIS — E785 Hyperlipidemia, unspecified: Secondary | ICD-10-CM

## 2014-09-30 DIAGNOSIS — I480 Paroxysmal atrial fibrillation: Secondary | ICD-10-CM

## 2014-09-30 DIAGNOSIS — Z23 Encounter for immunization: Secondary | ICD-10-CM

## 2014-09-30 NOTE — Progress Notes (Signed)
Garret Reddish, MD Phone: [phone]  Subjective:  Patient presents today to establish care with me as their new primary care provider. Patient was formerly a patient of Dr. Linna Darner. Chief complaint-noted.   GERD- well controlled Has been well controlled for several years. She does have a history of esophageal stricture. Has not been trialed on once a day PPI in some time. She is interested in trial.  ROS- no abdominal pain, sore throat, hoarseness, trouble swallowing  Atrial Fibrillation- paroxysmal, currently in a NSR Followed by Dr. Aundra Dubin. Patient is doing well with eliquis, felcainide, metoprolol. To her knowledge, she has not been in A fib for some time.  ROS- no chest pain or palpitations, shortness of breath if walks very quickly but stops if stops for 2 minutes, no problems housework  Hyperlipidemia-mild poor control  Lab Results  Component Value Date   LDLCALC 113* 06/07/2014  On statin: no Regular exercise: yes, walks ROS- no chest pain or shortness of breath. No myalgias   The following were reviewed and entered/updated in epic: Past Medical History  Diagnosis Date  . OA (osteoarthritis)   . DJD (degenerative joint disease)   . Macular degeneration   . Depression   . Colon polyp 2005  . Esophageal stricture   . Hyperlipidemia   . H. pylori infection   . GERD (gastroesophageal reflux disease)   . Nontoxic multinodular goiter   . Hiatal hernia   . Atrial fibrillation     persistent  . Postherpetic neuralgia at T3-T5 level 04/27/2011  . Rectal bleeding 07/06/2014    Hemorrhoid related in past.     Patient Active Problem List   Diagnosis Date Noted  . Atrial fibrillation 06/14/2011    Priority: High  . Hyperglycemia 06/07/2014    Priority: Medium  . Hyperlipidemia 01/28/2008    Priority: Medium  . History of Helicobacter pylori infection 07/06/2014    Priority: Low  . OSA (obstructive sleep apnea) 02/11/2012    Priority: Low  . Long term (current) use of  anticoagulants 07/19/2011    Priority: Low  . Chest pain 06/14/2011    Priority: Low  . Withdrawal symptoms, drug or narcotic 04/27/2011    Priority: Low  . Depression 01/30/2010    Priority: Low  . History of colonic polyps 01/30/2010    Priority: Low  . ESOPHAGEAL STRICTURE 07/20/2008    Priority: Low  . Osteoarthritis 07/20/2008    Priority: Low  . Multiple thyroid nodules 01/28/2008    Priority: Low  . GERD 01/28/2008    Priority: Low   Past Surgical History  Procedure Laterality Date  . Appendectomy    . Tubal ligation      with appendectomy  . Cholecystectomy  1989  . Total abdominal hysterectomy  1972    for pain (no BSO)  . Esophageal dilation      > 3 X; Dr Olevia Perches  . Colonoscopy w/ polypectomy  2005    Neg in 2010; Dr Olevia Perches  . Cardioversion  08/29/2011    Procedure: CARDIOVERSION;  Surgeon: Bing Quarry, MD;  Location: [facility];  Service: Cardiovascular;  Laterality: N/A;  . Cardioversion  11/22/2011    Procedure: CARDIOVERSION;  Surgeon: Coralyn Mark, MD;  Location: [place];  Service: Cardiovascular;  Laterality: N/A;  . Upper gi endoscopy  2010    H pylori  . Left and right heart catheterization with coronary angiogram N/A 12/02/2011    Procedure: LEFT AND RIGHT HEART CATHETERIZATION WITH CORONARY ANGIOGRAM;  Surgeon:  Jolaine Artist, MD;  Location: [facility];  Service: Cardiovascular;  Laterality: N/A;    Family History  Problem Relation Age of Onset  . Colon cancer Father     in 36s  . Osteoarthritis Father   . Coronary artery disease Father   . Heart failure Mother   . Coronary artery disease Mother   . Diabetes Mother   . Breast cancer Sister   . Diabetes Sister   . Stroke Sister   . Coronary artery disease Brother   . Throat cancer Brother     smoked  . Breast cancer Maternal Aunt   . Hypothyroidism Sister     Medications- reviewed and updated Current Outpatient Prescriptions  Medication Sig Dispense Refill  . ELIQUIS 5 MG TABS tablet  TAKE 1 TABLE BY MOUTH TWICE DAILY 60 tablet 10  . flecainide (TAMBOCOR) 100 MG tablet TAKE 1 TABLET (100 MG TOTAL) BY MOUTH 2 (TWO) TIMES DAILY. 180 tablet 1  . Melatonin 3 MG TABS Take 3 mg by mouth at bedtime.     . metoprolol tartrate (LOPRESSOR) 25 MG tablet Take 0.5 tablets (12.5 mg total) by mouth 2 (two) times daily. 90 tablet 1  . Multiple Vitamin (MULTIVITAMIN) tablet Take 1 tablet by mouth daily.    Marland Kitchen omeprazole (PRILOSEC) 40 MG capsule Take 40 mg by mouth daily.    Marland Kitchen acetaminophen (TYLENOL) 325 MG tablet Take 650 mg by mouth. 2 by mouth two times daily     No current facility-administered medications for this visit.    Allergies-reviewed and updated Allergies  Allergen Reactions  . Penicillins Rash  . Codeine Other (See Comments)    hallucinations  . Acetaminophen Other (See Comments)    hurts stomach  . Dabigatran Etexilate Mesylate Other (See Comments)     All extremities feel heavy and hurt  . Sulfonamide Derivatives Rash    History   Social History  . Marital Status: Married    Spouse Name: N/A    Number of Children: N/A  . Years of Education: N/A   Social History Main Topics  . Smoking status: Never Smoker   . Smokeless tobacco: Never Used  . Alcohol Use: No  . Drug Use: No  . Sexual Activity: Not on file   Other Topics Concern  . Not on file   Social History Narrative   Married 1956. 4 children 2 boys 2 girls. 16 grandkids.  5 greatgrandkids.    Pt lives in [place] with spouse.        Retired from [employer]      Hobbies: travel, spend time with people, family time   Exercise-walking      No HCPOA-advised to do this.    ROS--See HPI   Objective: BP 110/72 mmHg  Pulse 62  Temp(Src) 97.9 F (36.6 C)  Wt 173 lb (78.472 kg) Gen: NAD, resting comfortably, appears younger than stated age CV: RRR no murmurs rubs or gallops Lungs: CTAB no crackles, wheeze, rhonchi Abdomen: soft/nontender/nondistended/normal bowel sounds.    Ext: no edema, 2+ PT pulses Skin: warm, dry, no rash Neuro: grossly normal, moves all extremities, normal gait   Assessment/Plan:  GERD Well controlled on Omeprazole 40mg  BID-->trial once daily 09/2014. Does have history of esophageal stricture and if this is recurrent, may need to permanently be on BID dosing PPI but would want to discuss with GI.  Atrial fibrillation Following with Dr. Aundra Dubin. She is in SNR on flecainide per exam. She is anticoagulated  with eleqius. Rate control with metoprolol if she were to go into atrial fibrillation. She has mild SOB if she walks very quickly but resolves with rest and can continue to walk. There have bene no recent changes to this pattern. If there were any worsening, consider stress testing/cath per cards.   Hyperlipidemia No indication for primary prevention in age group. Also a1c 6 and do not want to increase risk. 30% obstruction LAD does reflect atherosclerosis but very reasonable for age.    Return precautions advised. States normally follows up yearly-plan follow up 6-12 months as long as she sees cardiology regularly.   Orders Placed This Encounter  Procedures  . Pneumococcal conjugate vaccine 13-valent    Meds ordered this encounter  Medications  . omeprazole (PRILOSEC) 40 MG capsule    Sig: Take 40 mg by mouth daily.

## 2014-09-30 NOTE — Patient Instructions (Addendum)
Prevnar today-1st of 2 pneumonia vaccines (next one has to be >1 year out)  Let's try omeprazole once a day. Only concern here is the strictures you have had before. If your reflux symptoms worsen, restart twice a day.

## 2014-10-02 ENCOUNTER — Encounter: Payer: Self-pay | Admitting: Family Medicine

## 2014-10-02 NOTE — Assessment & Plan Note (Signed)
No indication for primary prevention in age group. Also a1c 6 and do not want to increase risk. 30% obstruction LAD does reflect atherosclerosis but very reasonable for age.

## 2014-10-02 NOTE — Assessment & Plan Note (Addendum)
Following with Dr. Aundra Dubin. She is in SNR on flecainide per exam. She is anticoagulated with eleqius. Rate control with metoprolol if she were to go into atrial fibrillation. She has mild SOB if she walks very quickly but resolves with rest and can continue to walk. There have bene no recent changes to this pattern. If there were any worsening, consider stress testing/cath per cards.

## 2014-10-02 NOTE — Assessment & Plan Note (Signed)
Well controlled on Omeprazole 40mg  BID-->trial once daily 09/2014. Does have history of esophageal stricture and if this is recurrent, may need to permanently be on BID dosing PPI but would want to discuss with GI.

## 2014-10-25 ENCOUNTER — Encounter: Payer: Self-pay | Admitting: Family Medicine

## 2014-10-29 ENCOUNTER — Other Ambulatory Visit: Payer: Self-pay | Admitting: Internal Medicine

## 2014-11-28 ENCOUNTER — Telehealth: Payer: Self-pay | Admitting: Internal Medicine

## 2014-11-28 DIAGNOSIS — R109 Unspecified abdominal pain: Secondary | ICD-10-CM

## 2014-11-28 NOTE — Telephone Encounter (Signed)
Patient states she had a h. Pylori stool study done and it was negative. She states she was not told to stop her PPI prior to the study and she thinks she had a false negative. She is asking to have another study. States "I know I have it." Please, advise.

## 2014-11-29 NOTE — Telephone Encounter (Signed)
Lab in EPIC. Spoke with patient and gave her instructions.

## 2014-11-29 NOTE — Telephone Encounter (Signed)
Yes, she needs to be off PPI for ? 2 weeks, please repeat stool test.

## 2014-12-15 ENCOUNTER — Other Ambulatory Visit: Payer: Self-pay

## 2014-12-15 DIAGNOSIS — R109 Unspecified abdominal pain: Secondary | ICD-10-CM

## 2014-12-17 LAB — H. PYLORI ANTIGEN, STOOL: H pylori Ag, Stl: NEGATIVE

## 2015-01-24 ENCOUNTER — Encounter: Payer: Self-pay | Admitting: *Deleted

## 2015-01-24 ENCOUNTER — Telehealth: Payer: Self-pay | Admitting: *Deleted

## 2015-01-24 NOTE — Telephone Encounter (Signed)
The patient's insurance, [employer], will not cover omeprazole, 40 mg. The patient has been taking one capsule, po, bid. Is there another medication you can recommend the patient take instead? Please advise.

## 2015-01-24 NOTE — Telephone Encounter (Signed)
She needs to tell us what her insurance covers, If she does not know or will not find out soon, then we start Zantac 300mg , #60 1 po bid, 6 refills.

## 2015-01-25 MED ORDER — OMEPRAZOLE 40 MG PO CPDR: 40.0000 mg | DELAYED_RELEASE_CAPSULE | Freq: Every day | ORAL | Status: DC

## 2015-01-25 NOTE — Telephone Encounter (Signed)
Talked to the patient on 01/25/15 at 8:20 am to discuss what their insurance would cover since their insurance would not cover taking omeprazole twice a day. The patient stated that she is now taking omeprazole, 40 mg, one tablet each day now, not two tablets. She no longer needs to take two tablets a day. I sent a Rx for omeprazole, 40 mg; take 1 tablet, po, qd to [facility] in [place], [place]. Patient stated they understood.

## 2015-02-13 ENCOUNTER — Telehealth: Payer: Self-pay | Admitting: Internal Medicine

## 2015-02-13 NOTE — Telephone Encounter (Signed)
Please, advise on this.

## 2015-02-13 NOTE — Telephone Encounter (Signed)
Per pt she was sent to the lab for H pylori testing. The first time she went down she says she wasn't given any instructions on what she needed to do. When she went home she read that if she was taking medications for acid reflux the result would come back negative. She states she spoke to the nurse, and she agreed she should have another one done. She is now receiving a bill for both although the first result came back negative due to the medications she was on. She was told by the lab that the nurse or office would need to call downstairs and have the charge removed. She would like a call back.

## 2015-02-13 NOTE — Telephone Encounter (Signed)
Dr. Olevia Perches, Please advise if patient should be credited with first H. Pylori stool test since she was not told to stop PPI prior to collecting stool.

## 2015-02-14 NOTE — Telephone Encounter (Signed)
I agree she should not be charged for the first H.Pylori stool test.Do I need to call the lab downstairs to write it off?

## 2015-02-15 NOTE — Telephone Encounter (Signed)
Spoke with Barb Merino, RN and was instructed to call [facility] and have the test charge sent to [facility] and removed from patient. [facility] [employer] and spoke with Millie. The charge for the test done 06/2014 has already been paid. She charged the repeat h. Pylori stool test done on 12/15/14 to our office and credited the patient.

## 2015-03-14 ENCOUNTER — Other Ambulatory Visit: Payer: Self-pay | Admitting: Cardiology

## 2015-04-07 ENCOUNTER — Other Ambulatory Visit: Payer: Self-pay | Admitting: Cardiology

## 2015-05-04 ENCOUNTER — Telehealth: Payer: Self-pay | Admitting: Cardiology

## 2015-05-04 ENCOUNTER — Other Ambulatory Visit: Payer: Self-pay | Admitting: Neurosurgery

## 2015-05-04 NOTE — Telephone Encounter (Signed)
Pt denies any new cardiac symptoms. Pt states her follow up appt with Dr Aundra Dubin is not until Sept, this is after her surgery that is scheduled for August.  I offered pt appt with APP prior to surgery is she prefers to come to be checked prior to surgery, pt declined at this time.

## 2015-05-04 NOTE — Telephone Encounter (Signed)
Request for surgical clearance:  1. What type of surgery is being performed? Anterior Cervical Fusion    2. When is this surgery scheduled? 05/29/2015  3. Are there any medications that need to be held prior to surgery and how long? Eliquis. Not sure how long the meds will need to be changed   4. Name of physician performing surgery? Dr. Everlene Balls   5. What is your office phone and fax number? Phone: [phone] Ext [phone] Fax : [phone]

## 2015-05-04 NOTE — Telephone Encounter (Signed)
See written message on form.

## 2015-05-08 ENCOUNTER — Telehealth: Payer: Self-pay | Admitting: Cardiology

## 2015-05-08 NOTE — Telephone Encounter (Signed)
Pt states she would feel more comfortable coming for office visit prior to  neck surgery. Pt scheduled to see Kathrene Alu 05/10/15 at 3:30PM, I have made a note on original surgical clearance request form from  Dr Saintclair Halsted that pt requesting office visit prior to surgery, to be refaxed to Dr Saintclair Halsted.

## 2015-05-08 NOTE — Telephone Encounter (Signed)
New Message  Pt requested to speak w/ RN about previous note- setting up a RN visit or lab work in preparation for surgery. No order in system for lab. Please call back and discuss.

## 2015-05-09 ENCOUNTER — Encounter: Payer: Self-pay | Admitting: Family Medicine

## 2015-05-09 ENCOUNTER — Ambulatory Visit (INDEPENDENT_AMBULATORY_CARE_PROVIDER_SITE_OTHER): Payer: Medicare Other | Admitting: Family Medicine

## 2015-05-09 VITALS — BP 130/68 | HR 58 | Temp 98.4°F | Wt 167.0 lb

## 2015-05-09 DIAGNOSIS — R739 Hyperglycemia, unspecified: Secondary | ICD-10-CM | POA: Diagnosis not present

## 2015-05-09 DIAGNOSIS — R599 Enlarged lymph nodes, unspecified: Secondary | ICD-10-CM | POA: Diagnosis not present

## 2015-05-09 DIAGNOSIS — R5383 Other fatigue: Secondary | ICD-10-CM

## 2015-05-09 DIAGNOSIS — R590 Localized enlarged lymph nodes: Secondary | ICD-10-CM

## 2015-05-09 LAB — CBC WITH DIFFERENTIAL/PLATELET
BASOS PCT: 0.5 % (ref 0.0–3.0)
Basophils Absolute: 0 10*3/uL (ref 0.0–0.1)
Eosinophils Absolute: 0.2 10*3/uL (ref 0.0–0.7)
Eosinophils Relative: 2.1 % (ref 0.0–5.0)
HEMATOCRIT: 37.4 % (ref 36.0–46.0)
Hemoglobin: 12.4 g/dL (ref 12.0–15.0)
LYMPHS ABS: 2.2 10*3/uL (ref 0.7–4.0)
Lymphocytes Relative: 29.7 % (ref 12.0–46.0)
MCHC: 33 g/dL (ref 30.0–36.0)
MCV: 87.2 fl (ref 78.0–100.0)
Monocytes Absolute: 0.5 10*3/uL (ref 0.1–1.0)
Monocytes Relative: 6.9 % (ref 3.0–12.0)
NEUTROS ABS: 4.5 10*3/uL (ref 1.4–7.7)
Neutrophils Relative %: 60.8 % (ref 43.0–77.0)
PLATELETS: 309 10*3/uL (ref 150.0–400.0)
RBC: 4.29 Mil/uL (ref 3.87–5.11)
RDW: 14.6 % (ref 11.5–15.5)
WBC: 7.4 10*3/uL (ref 4.0–10.5)

## 2015-05-09 LAB — COMPREHENSIVE METABOLIC PANEL
ALT: 14 U/L (ref 0–35)
AST: 17 U/L (ref 0–37)
Albumin: 4.1 g/dL (ref 3.5–5.2)
Alkaline Phosphatase: 62 U/L (ref 39–117)
BUN: 18 mg/dL (ref 6–23)
CO2: 26 meq/L (ref 19–32)
CREATININE: 0.89 mg/dL (ref 0.40–1.20)
Calcium: 9.3 mg/dL (ref 8.4–10.5)
Chloride: 106 mEq/L (ref 96–112)
GFR: 64.86 mL/min (ref >60.00)
Glucose, Bld: 97 mg/dL (ref 70–99)
Potassium: 4.6 mEq/L (ref 3.5–5.1)
SODIUM: 140 meq/L (ref 135–145)
TOTAL PROTEIN: 7 g/dL (ref 6.0–8.3)
Total Bilirubin: 0.6 mg/dL (ref 0.2–1.2)

## 2015-05-09 LAB — HEMOGLOBIN A1C: HEMOGLOBIN A1C: 5.7 % (ref 4.6–6.5)

## 2015-05-09 LAB — TSH: TSH: 0.68 u[IU]/mL (ref 0.35–4.50)

## 2015-05-09 NOTE — Patient Instructions (Signed)
I suspect you are dealing with an infection your body is fighting off. Could not detect the specific infection today. Let's check in 4-6 weeks form now to see if this area continues to improve or has resolved. If you have new or worsening symptoms see [place] sooner.   Given your fatigue, let's update your labs.

## 2015-05-09 NOTE — Progress Notes (Signed)
Garret Reddish, MD  Subjective:  Bridget Lane is a 81 y.o. year old very pleasant female patient who presents with:  Knot on neck (right) Fatigue -planned fusion 8/8 on left cervical spine-fusion. States has had some decreased strength over last few months due to dealing with pain. Perhaps some mild fatigue worse than normal over last month but states has been large stressor continuing to think about surgery as well. States she has felt more run down like this in past when in a fib and scheduled cards visit tomorrow  Developed a "knot" on her right neck popped up yesterday. Denies recent cold symptoms other than some mild congestion. Area was larger yesterday and improving in size today. Never had anything like this before. No treatments tried. Not particularly tender.   ROS- No fever, chills, nausea, vomiting.   Past Medical History- a fib, HLD, hyperglycemia  Medications- reviewed and updated Current Outpatient Prescriptions  Medication Sig Dispense Refill  . ELIQUIS 5 MG TABS tablet TAKE 1 TABLE BY MOUTH TWICE DAILY 60 tablet 10  . flecainide (TAMBOCOR) 100 MG tablet TAKE 1 TABLET (100 MG TOTAL) BY MOUTH 2 (TWO) TIMES DAILY. 180 tablet 0  . Melatonin 3 MG TABS Take 3 mg by mouth at bedtime.     . metoprolol tartrate (LOPRESSOR) 25 MG tablet TAKE 1/2 TABLET TWICE DAILY 90 tablet 1  . Multiple Vitamin (MULTIVITAMIN) tablet Take 1 tablet by mouth daily.    Marland Kitchen omeprazole (PRILOSEC) 40 MG capsule Take 1 capsule (40 mg total) by mouth daily. 30 capsule 6  . acetaminophen (TYLENOL) 325 MG tablet Take 650 mg by mouth. 2 by mouth two times daily     Objective: BP 130/68 mmHg  Pulse 58  Temp(Src) 98.4 F (36.9 C)  Wt 167 lb (75.751 kg) Gen: NAD, resting comfortably MMM, oropharynx normal, TM normal. Does have bilateral cervical lymphadenopathy with prominent 1 x 1 cm (if not 1.5 cm) on right and 1 cm on left. No redness, tenderness over lesion CV: RRR no murmurs rubs or gallops Lungs:  CTAB no crackles, wheeze, rhonchi Abdomen: soft/nontender/nondistended/normal bowel sounds.  Ext: no edema Skin: warm, dry, no rash   Assessment/Plan:  Anterior cervical lymphadenopathy (reported as R only, but actually bilateral) - Plan: CBC with Differential/Platelet, Comprehensive metabolic panel, TSH fatigue - Plan: CBC with Differential/Platelet, Comprehensive metabolic panel, Hemoglobin A1c, TSH Doubt malignancy as cause of cervical adenopathy and suspect infectious in origin though no clear infection apparent. We discussed follow up in 4-6 weeks and if persists or worsens before then consider further evaluation. Regarding fatigue, TSH normal, CBC with diff normal, CMP normal and a1c only at risk. No cause on labs for fatigue and was in regular rhythm in regards to a fib today. Suspect this could be stress related. Obviously fatigue concerning for malignancy as well and as noted- watching closely with follow up.   Return precautions advised.   Orders Placed This Encounter  Procedures  . CBC with Differential/Platelet  . Comprehensive metabolic panel    [place]  . Hemoglobin A1c    [place]  . TSH    [place]

## 2015-05-10 ENCOUNTER — Encounter: Payer: Self-pay | Admitting: Nurse Practitioner

## 2015-05-10 ENCOUNTER — Ambulatory Visit (INDEPENDENT_AMBULATORY_CARE_PROVIDER_SITE_OTHER): Payer: Medicare Other | Admitting: Nurse Practitioner

## 2015-05-10 VITALS — BP 130/80 | HR 59 | Ht 64.5 in | Wt 167.8 lb

## 2015-05-10 DIAGNOSIS — Z7901 Long term (current) use of anticoagulants: Secondary | ICD-10-CM | POA: Diagnosis not present

## 2015-05-10 DIAGNOSIS — Z01818 Encounter for other preprocedural examination: Secondary | ICD-10-CM

## 2015-05-10 DIAGNOSIS — I480 Paroxysmal atrial fibrillation: Secondary | ICD-10-CM | POA: Diagnosis not present

## 2015-05-10 NOTE — Patient Instructions (Signed)
We will be checking the following labs today - NONE   Medication Instructions:    Continue with your current medicines.     Testing/Procedures To Be Arranged:  N/A  Follow-Up:   See Dr. Aundra Dubin as planned.     Other Special Instructions:   You have been cleared for your surgery if you wish to proceed. You will need to hold your Eliquis 4 days prior to surgery and continue with your other medicines.   Call the [place] office at [phone] if you have any questions, problems or concerns.

## 2015-05-10 NOTE — Progress Notes (Signed)
CARDIOLOGY OFFICE NOTE  Date:  05/10/2015    Bridget Lane Date of Birth: December 03, 1932 Medical Record #[id number]  PCP:  Garret Reddish, MD  Cardiologist:  Aundra Dubin    Chief Complaint  Patient presents with  . Pre-op Exam    Follow up visit - seen for Dr. Aundra Dubin    History of Present Illness: Bridget Lane is a 81 y.o. female who presents today for a pre op exam. Seen for Dr. Aundra Dubin.  She has a history of paroxysmal atrial fibrillation and is on apixaban and flecainide.  Last seen in August of 2015 and was doing well.  Considering cervical spine surgery.  Dr. Aundra Dubin has already cleared her - to stop Eliquis 3 to 4 days prior and continue with her beta blocker and Flecainide.   Comes in today. Here alone. She admits she is nervous about having surgery. Lots of neck pain. It has really affected her quality of life. She wants to make sure she is not in atrial fib. She did seen her PCP yesterday due to a "knot" on her neck - this worries her. Has had some chest pain - described as "electric shock" - very fleeting. Walks daily without issue. No syncope.   PMH: 1. BPPV 2. OA 3. GERD 4. Hyperlipidemia 5. Paroxysmal atrial fibrillation 6. Multinodular goiter 7. Cholecystectomy 8. Appendectomy 9. Echo (7/13) with EF 55-60% 10. LHC/RHC (7/13) with mild nonobstructive CAD, LAD bridging; mean RA 4, PA 28/7, mean PCWP 7. 11. Esophageal dysmotility.  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 OA (osteoarthritis)   . DJD (degenerative joint disease)   . Macular degeneration   . Depression   . Colon polyp 2005  . Esophageal stricture   . Hyperlipidemia   . H. pylori infection   . GERD (gastroesophageal reflux disease)   . Nontoxic multinodular goiter   . Hiatal hernia   . Atrial fibrillation     persistent  . Postherpetic neuralgia at T3-T5 level 04/27/2011  . Rectal bleeding 07/06/2014    Hemorrhoid related in past.      Past Surgical History    Procedure Laterality Date  . Appendectomy    . Tubal ligation      with appendectomy  . Cholecystectomy  1989  . Total abdominal hysterectomy  1972    for pain (no BSO)  . Esophageal dilation      > 3 X; Dr Olevia Perches  . Colonoscopy w/ polypectomy  2005    Neg in 2010; Dr Olevia Perches  . Cardioversion  08/29/2011    Procedure: CARDIOVERSION;  Surgeon: Bing Quarry, MD;  Location: [facility];  Service: Cardiovascular;  Laterality: N/A;  . Cardioversion  11/22/2011    Procedure: CARDIOVERSION;  Surgeon: Coralyn Mark, MD;  Location: [facility];  Service: Cardiovascular;  Laterality: N/A;  . Upper gi endoscopy  2010    H pylori  . Left and right heart catheterization with coronary angiogram N/A 12/02/2011    Procedure: LEFT AND RIGHT HEART CATHETERIZATION WITH CORONARY ANGIOGRAM;  Surgeon: Jolaine Artist, MD;  Location: [facility] CATH LAB;  Service: Cardiovascular;  Laterality: N/A;     Medications: Current Outpatient Prescriptions  Medication Sig Dispense Refill  . acetaminophen (TYLENOL) 325 MG tablet Take 650 mg by mouth. 2 by mouth two times daily    . ELIQUIS 5 MG TABS tablet TAKE 1 TABLE BY MOUTH TWICE DAILY 60 tablet 10  . flecainide (TAMBOCOR) 100 MG tablet TAKE  1 TABLET (100 MG TOTAL) BY MOUTH 2 (TWO) TIMES DAILY. 180 tablet 0  . Melatonin 3 MG TABS Take 3 mg by mouth at bedtime.     . metoprolol tartrate (LOPRESSOR) 25 MG tablet TAKE 1/2 TABLET TWICE DAILY 90 tablet 1  . Multiple Vitamin (MULTIVITAMIN) tablet Take 1 tablet by mouth daily.    Marland Kitchen omeprazole (PRILOSEC) 40 MG capsule Take 1 capsule (40 mg total) by mouth daily. 30 capsule 6   No current facility-administered medications for this visit.    Allergies: Allergies  Allergen Reactions  . Penicillins Rash  . Codeine Other (See Comments)    hallucinations  . Acetaminophen Other (See Comments)    hurts stomach  . Dabigatran Etexilate Mesylate Other (See Comments)     All extremities feel heavy and hurt  . Sulfonamide Derivatives  Rash    Social History: The patient  reports that she has never smoked. She has never used smokeless tobacco. She reports that she does not drink alcohol or use illicit drugs.   Family History: The patient's family history includes Breast cancer in her maternal aunt and sister; Colon cancer in her father; Coronary artery disease in her brother, father, and mother; Diabetes in her mother and sister; Heart failure in her mother; Hypothyroidism in her sister; Osteoarthritis in her father; Stroke in her sister; Throat cancer in her brother.   Review of Systems: Please see the history of present illness.   Otherwise, the review of systems is positive for none.   All other systems are reviewed and negative.   Physical Exam: VS:  BP 130/80 mmHg  Pulse 59  Ht 5' 4.5" (1.638 m)  Wt 167 lb 12.8 oz (76.114 kg)  BMI 28.37 kg/m2 .  BMI Body mass index is 28.37 kg/(m^2).  Wt Readings from Last 3 Encounters:  05/10/15 167 lb 12.8 oz (76.114 kg)  05/09/15 167 lb (75.751 kg)  09/30/14 173 lb (78.472 kg)    General: Pleasant. Well developed, well nourished and in no acute distress.  HEENT: Normal but she does have a nickel size knot on the right noted.  Neck: Supple, no JVD, carotid bruits, or masses noted.  Cardiac: Regular rate and rhythm. No murmurs, rubs, or gallops. No edema.  Respiratory:  Lungs are clear to auscultation bilaterally with normal work of breathing.  GI: Soft and nontender.  MS: No deformity or atrophy. Gait and ROM intact. Skin: Warm and dry. Color is normal.  Neuro:  Strength and sensation are intact and no gross focal deficits noted.  Psych: Alert, appropriate and with normal affect.   LABORATORY DATA:  EKG:  EKG is ordered today. This demonstrates sinus brady with 1st degree AV block. Rate is 59.  Lab Results  Component Value Date   WBC 7.4 05/09/2015   HGB 12.4 05/09/2015   HCT 37.4 05/09/2015   PLT 309.0 05/09/2015   GLUCOSE 97 05/09/2015   CHOL 200 06/07/2014    TRIG 199.0* 06/07/2014   HDL 47.50 06/07/2014   LDLCALC 113* 06/07/2014   ALT 14 05/09/2015   AST 17 05/09/2015   NA 140 05/09/2015   K 4.6 05/09/2015   CL 106 05/09/2015   CREATININE 0.89 05/09/2015   BUN 18 05/09/2015   CO2 26 05/09/2015   TSH 0.68 05/09/2015   INR 1.08 12/03/2011   HGBA1C 5.7 05/09/2015    BNP (last 3 results) No results for input(s): BNP in the last 8760 hours.  ProBNP (last 3 results) No results for  input(s): PROBNP in the last 8760 hours.   Other Studies Reviewed Today: Echo Study Conclusions from 2013  - Left ventricle: The cavity size was normal. Systolic function was normal. The estimated ejection fraction was in the range of 55% to 60%. Wall motion was normal; there were no regional wall motion abnormalities. - Left atrium: The atrium was mildly dilated. - Atrial septum: No defect or patent foramen ovale was identified.  Cardiac Cath Procedure Note from 11/2011  Findings:  RA = 4 RV = 29/6/8 PA = 28/7 (16) PCW = 7  Fick cardiac output/index = 5.2/2.8 PVR = 1.7 Woods FA sat = 96% PA sat = 67%, 61%, 70%  Ao Pressure: 119/51 (77) LV Pressure: 120/0/8 There was no signficant gradient across the aortic valve on pullback.  Left main: Normal  LAD: 30-40% prox followed by 30%. 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 Minimal plaque proximally. Otherwise normal.  LV-gram done in the RAO projection: Ejection fraction = 55%. No wall motion abnormalities/  Assessment: 1. Mild CAD 2. Normal LV function 3. Normal right sided pressures 4. Myocardial bridging section with no evidence of flow limitation  Plan/Discussion:  Essentially normal cath. No findings to explain exertional dyspnea and CP. Will review echo. If symptoms persist will follow-up with PFTs +/- chest CT.  Glori Bickers, MD 8:16 AM   Assessment/Plan: 1. Pre op clearance - she is felt to be a  satisfactory candidate for her surgery. She had mild disease only on left heart cath in 2013.She has no current symptoms. Will be available as needed. Would hold Eliquis 4 days prior and resume when ok by neurosurgery.  2.  Atrial fibrillation: Paroxysmal. EF was normal on 7/13 echo. Maintaining NSR on flecainide and tolerating metoprolol without significant bradycardia. She remains on anticoagulation.   3. Chronic anticoagulation - no problems noted.   Current medicines are reviewed with the patient today.  The patient does not have concerns regarding medicines other than what has been noted above.  The following changes have been made:  See above.  Labs/ tests ordered today include:   No orders of the defined types were placed in this encounter.     Disposition:   FU with Dr. Aundra Dubin as planned.   Patient is agreeable to this plan and will call if any problems develop in the interim.   Signed: Burtis Junes, RN, ANP-C 05/10/2015 4:06 PM  [facility] [address] [place], [place]  [address] Phone: [phone] Fax: [phone]

## 2015-05-23 ENCOUNTER — Encounter (HOSPITAL_COMMUNITY): Payer: Self-pay

## 2015-05-23 ENCOUNTER — Encounter (HOSPITAL_COMMUNITY)
Admission: RE | Admit: 2015-05-23 | Discharge: 2015-05-23 | Disposition: A | Discharge status: Home / Self Care | Payer: Medicare Other | Source: Ambulatory Visit | Attending: Neurosurgery | Admitting: Neurosurgery

## 2015-05-23 DIAGNOSIS — Z01812 Encounter for preprocedural laboratory examination: Secondary | ICD-10-CM | POA: Insufficient documentation

## 2015-05-23 HISTORY — DX: Cardiac arrhythmia, unspecified: I49.9

## 2015-05-23 LAB — CBC
HEMATOCRIT: 39.7 % (ref 36.0–46.0)
Hemoglobin: 13.1 g/dL (ref 12.0–15.0)
MCH: 29.4 pg (ref 26.0–34.0)
MCHC: 33 g/dL (ref 30.0–36.0)
MCV: 89 fL (ref 78.0–100.0)
Platelets: 275 10*3/uL (ref 150–400)
RBC: 4.46 MIL/uL (ref 3.87–5.11)
RDW: 13.9 % (ref 11.5–15.5)
WBC: 6.6 10*3/uL (ref 4.0–10.5)

## 2015-05-23 LAB — BASIC METABOLIC PANEL
Anion gap: 8 (ref 5–15)
BUN: 18 mg/dL (ref 6–20)
CALCIUM: 9.5 mg/dL (ref 8.9–10.3)
CO2: 24 mmol/L (ref 22–32)
CREATININE: 1.08 mg/dL — AB (ref 0.44–1.00)
Chloride: 108 mmol/L (ref 101–111)
GFR calc non Af Amer: 47 mL/min — ABNORMAL LOW (ref >60)
GFR, EST AFRICAN AMERICAN: 55 mL/min — AB (ref >60)
Glucose, Bld: 96 mg/dL (ref 65–99)
Potassium: 4.8 mmol/L (ref 3.5–5.1)
Sodium: 140 mmol/L (ref 135–145)

## 2015-05-23 LAB — SURGICAL PCR SCREEN
MRSA, PCR: NEGATIVE
STAPHYLOCOCCUS AUREUS: NEGATIVE

## 2015-05-23 NOTE — Pre-Procedure Instructions (Signed)
    MICHAELYN WALL  05/23/2015      [id number] - [place], [place] - [address] [place] [address] Phone: [phone] Fax: [phone]    Your procedure is scheduled on 05/29/15.  Report to [facility] Admitting at 530 A.M.  Call this number if you have problems the morning of surgery:  [phone]   Remember:  Do not eat food or drink liquids after midnight.  Take these medicines the morning of surgery with A SIP OF WATER -- metoprolol,prilosec   Do not wear jewelry, make-up or nail polish.  Do not wear lotions, powders, or perfumes.  You may wear deodorant.  Do not shave 48 hours prior to surgery.  Men may shave face and neck.  Do not bring valuables to the hospital.  [facility] is not responsible for any belongings or valuables.  Contacts, dentures or bridgework may not be worn into surgery.  Leave your suitcase in the car.  After surgery it may be brought to your room.  For patients admitted to the hospital, discharge time will be determined by your treatment team.  Patients discharged the day of surgery will not be allowed to drive home.   Name and phone number of your driver:    Special instructions:    Please read over the following fact sheets that you were given. Pain Booklet, Coughing and Deep Breathing, MRSA Information and Surgical Site Infection Prevention

## 2015-05-23 NOTE — Progress Notes (Signed)
Pt is to stop Eliquis 8/3

## 2015-05-29 ENCOUNTER — Inpatient Hospital Stay (HOSPITAL_COMMUNITY)
Admission: RE | Admit: 2015-05-29 | Discharge: 2015-05-30 | DRG: 472 | Disposition: A | Discharge status: Home / Self Care | Payer: Medicare Other | Source: Ambulatory Visit | Attending: Neurosurgery | Admitting: Neurosurgery

## 2015-05-29 ENCOUNTER — Inpatient Hospital Stay (HOSPITAL_COMMUNITY): Payer: Medicare Other | Admitting: Anesthesiology

## 2015-05-29 ENCOUNTER — Encounter (HOSPITAL_COMMUNITY)
Admission: RE | Disposition: A | Discharge status: Home / Self Care | Payer: Self-pay | Source: Ambulatory Visit | Attending: Neurosurgery

## 2015-05-29 ENCOUNTER — Encounter (HOSPITAL_COMMUNITY): Payer: Self-pay | Admitting: *Deleted

## 2015-05-29 ENCOUNTER — Inpatient Hospital Stay (HOSPITAL_COMMUNITY): Payer: Medicare Other

## 2015-05-29 DIAGNOSIS — K219 Gastro-esophageal reflux disease without esophagitis: Secondary | ICD-10-CM | POA: Diagnosis present

## 2015-05-29 DIAGNOSIS — Z880 Allergy status to penicillin: Secondary | ICD-10-CM

## 2015-05-29 DIAGNOSIS — Z885 Allergy status to narcotic agent status: Secondary | ICD-10-CM

## 2015-05-29 DIAGNOSIS — M4802 Spinal stenosis, cervical region: Secondary | ICD-10-CM | POA: Diagnosis present

## 2015-05-29 DIAGNOSIS — M4312 Spondylolisthesis, cervical region: Secondary | ICD-10-CM | POA: Diagnosis present

## 2015-05-29 DIAGNOSIS — E785 Hyperlipidemia, unspecified: Secondary | ICD-10-CM | POA: Diagnosis present

## 2015-05-29 DIAGNOSIS — F329 Major depressive disorder, single episode, unspecified: Secondary | ICD-10-CM | POA: Diagnosis present

## 2015-05-29 DIAGNOSIS — I481 Persistent atrial fibrillation: Secondary | ICD-10-CM | POA: Diagnosis present

## 2015-05-29 DIAGNOSIS — M47812 Spondylosis without myelopathy or radiculopathy, cervical region: Secondary | ICD-10-CM | POA: Diagnosis present

## 2015-05-29 DIAGNOSIS — Z882 Allergy status to sulfonamides status: Secondary | ICD-10-CM

## 2015-05-29 DIAGNOSIS — H3530 Unspecified macular degeneration: Secondary | ICD-10-CM | POA: Diagnosis present

## 2015-05-29 DIAGNOSIS — Z419 Encounter for procedure for purposes other than remedying health state, unspecified: Secondary | ICD-10-CM

## 2015-05-29 DIAGNOSIS — Z7902 Long term (current) use of antithrombotics/antiplatelets: Secondary | ICD-10-CM

## 2015-05-29 DIAGNOSIS — G4730 Sleep apnea, unspecified: Secondary | ICD-10-CM | POA: Diagnosis present

## 2015-05-29 DIAGNOSIS — Z888 Allergy status to other drugs, medicaments and biological substances status: Secondary | ICD-10-CM | POA: Diagnosis not present

## 2015-05-29 HISTORY — PX: ANTERIOR CERVICAL DECOMP/DISCECTOMY FUSION: SHX1161

## 2015-05-29 SURGERY — ANTERIOR CERVICAL DECOMPRESSION/DISCECTOMY FUSION 2 LEVELS
Anesthesia: General

## 2015-05-29 MED ORDER — VANCOMYCIN HCL IN DEXTROSE 1-5 GM/200ML-% IV SOLN
1000.0000 mg | INTRAVENOUS | Status: AC
  Administered 2015-05-29: 1000 mg via INTRAVENOUS

## 2015-05-29 MED ORDER — VANCOMYCIN HCL IN DEXTROSE 1-5 GM/200ML-% IV SOLN
INTRAVENOUS | Status: AC
  Filled 2015-05-29: qty 200

## 2015-05-29 MED ORDER — ROCURONIUM BROMIDE 50 MG/5ML IV SOLN
INTRAVENOUS | Status: AC
  Filled 2015-05-29: qty 1

## 2015-05-29 MED ORDER — NEOSTIGMINE METHYLSULFATE 10 MG/10ML IV SOLN
INTRAVENOUS | Status: DC | PRN
  Administered 2015-05-29: 3 mg via INTRAVENOUS

## 2015-05-29 MED ORDER — PROPOFOL 10 MG/ML IV BOLUS
INTRAVENOUS | Status: DC | PRN
  Administered 2015-05-29: 100 mg via INTRAVENOUS

## 2015-05-29 MED ORDER — DEXAMETHASONE SODIUM PHOSPHATE 10 MG/ML IJ SOLN
INTRAMUSCULAR | Status: AC
  Filled 2015-05-29: qty 1

## 2015-05-29 MED ORDER — LIDOCAINE HCL (CARDIAC) 20 MG/ML IV SOLN
INTRAVENOUS | Status: AC
  Filled 2015-05-29: qty 10

## 2015-05-29 MED ORDER — PHENOL 1.4 % MT LIQD
1.0000 | OROMUCOSAL | Status: DC | PRN
Start: 2015-05-29 — End: 2015-05-30
  Administered 2015-05-29: 1 via OROMUCOSAL
  Filled 2015-05-29: qty 177

## 2015-05-29 MED ORDER — FENTANYL CITRATE (PF) 100 MCG/2ML IJ SOLN
INTRAMUSCULAR | Status: DC | PRN
  Administered 2015-05-29 (×5): 50 ug via INTRAVENOUS

## 2015-05-29 MED ORDER — ROCURONIUM BROMIDE 100 MG/10ML IV SOLN
INTRAVENOUS | Status: DC | PRN
  Administered 2015-05-29: 40 mg via INTRAVENOUS

## 2015-05-29 MED ORDER — SUCCINYLCHOLINE CHLORIDE 20 MG/ML IJ SOLN
INTRAMUSCULAR | Status: DC | PRN
  Administered 2015-05-29: 100 mg via INTRAVENOUS

## 2015-05-29 MED ORDER — HYDROMORPHONE HCL 1 MG/ML IJ SOLN: 0.2500 mg | INTRAMUSCULAR | Status: DC | PRN

## 2015-05-29 MED ORDER — PROPOFOL 10 MG/ML IV BOLUS
INTRAVENOUS | Status: AC
  Filled 2015-05-29: qty 20

## 2015-05-29 MED ORDER — ONE-DAILY MULTI VITAMINS PO TABS: 1.0000 | ORAL_TABLET | Freq: Every day | ORAL | Status: DC

## 2015-05-29 MED ORDER — OXYCODONE-ACETAMINOPHEN 5-325 MG PO TABS
1.0000 | ORAL_TABLET | ORAL | Status: DC | PRN
  Administered 2015-05-29 (×2): 1 via ORAL
  Filled 2015-05-29: qty 2
  Filled 2015-05-29 (×2): qty 1

## 2015-05-29 MED ORDER — GLYCOPYRROLATE 0.2 MG/ML IJ SOLN
INTRAMUSCULAR | Status: DC | PRN
  Administered 2015-05-29: 0.2 mg via INTRAVENOUS
  Administered 2015-05-29: 0.4 mg via INTRAVENOUS

## 2015-05-29 MED ORDER — ACETAMINOPHEN 325 MG PO TABS: 650.0000 mg | ORAL_TABLET | ORAL | Status: DC | PRN

## 2015-05-29 MED ORDER — PROMETHAZINE HCL 25 MG/ML IJ SOLN
6.2500 mg | INTRAMUSCULAR | Status: DC | PRN
Start: 2015-05-29 — End: 2015-05-29

## 2015-05-29 MED ORDER — FLECAINIDE ACETATE 100 MG PO TABS
100.0000 mg | ORAL_TABLET | Freq: Two times a day (BID) | ORAL | Status: DC
  Administered 2015-05-29: 100 mg via ORAL
  Filled 2015-05-29 (×3): qty 1

## 2015-05-29 MED ORDER — HEMOSTATIC AGENTS (NO CHARGE) OPTIME
TOPICAL | Status: DC | PRN
  Administered 2015-05-29: 1 via TOPICAL

## 2015-05-29 MED ORDER — ONDANSETRON HCL 4 MG/2ML IJ SOLN
INTRAMUSCULAR | Status: AC
  Filled 2015-05-29: qty 2

## 2015-05-29 MED ORDER — PANTOPRAZOLE SODIUM 40 MG PO TBEC
40.0000 mg | DELAYED_RELEASE_TABLET | Freq: Every day | ORAL | Status: DC
  Administered 2015-05-29 (×2): 40 mg via ORAL
  Filled 2015-05-29 (×2): qty 1

## 2015-05-29 MED ORDER — VANCOMYCIN HCL IN DEXTROSE 1-5 GM/200ML-% IV SOLN
1000.0000 mg | Freq: Once | INTRAVENOUS | Status: AC
  Administered 2015-05-29: 1000 mg via INTRAVENOUS
  Filled 2015-05-29: qty 200

## 2015-05-29 MED ORDER — DOCUSATE SODIUM 100 MG PO CAPS: 100.0000 mg | ORAL_CAPSULE | Freq: Every day | ORAL | Status: DC | PRN

## 2015-05-29 MED ORDER — MENTHOL 3 MG MT LOZG
1.0000 | LOZENGE | OROMUCOSAL | Status: DC | PRN
  Filled 2015-05-29: qty 9

## 2015-05-29 MED ORDER — SUCCINYLCHOLINE CHLORIDE 20 MG/ML IJ SOLN
INTRAMUSCULAR | Status: AC
  Filled 2015-05-29: qty 1

## 2015-05-29 MED ORDER — FENTANYL CITRATE (PF) 250 MCG/5ML IJ SOLN
INTRAMUSCULAR | Status: AC
  Filled 2015-05-29: qty 5

## 2015-05-29 MED ORDER — ONDANSETRON HCL 4 MG/2ML IJ SOLN
INTRAMUSCULAR | Status: DC | PRN
  Administered 2015-05-29: 4 mg via INTRAVENOUS

## 2015-05-29 MED ORDER — ADULT MULTIVITAMIN W/MINERALS CH
1.0000 | ORAL_TABLET | Freq: Every day | ORAL | Status: DC
  Administered 2015-05-29: 1 via ORAL
  Filled 2015-05-29 (×2): qty 1

## 2015-05-29 MED ORDER — SODIUM CHLORIDE 0.9 % IR SOLN
Status: DC | PRN
  Administered 2015-05-29: 07:00:00

## 2015-05-29 MED ORDER — STERILE WATER FOR INJECTION IJ SOLN
INTRAMUSCULAR | Status: AC
  Filled 2015-05-29: qty 10

## 2015-05-29 MED ORDER — 0.9 % SODIUM CHLORIDE (POUR BTL) OPTIME
TOPICAL | Status: DC | PRN
  Administered 2015-05-29: 1000 mL

## 2015-05-29 MED ORDER — ACETAMINOPHEN 650 MG RE SUPP
650.0000 mg | RECTAL | Status: DC | PRN
  Filled 2015-05-29: qty 1

## 2015-05-29 MED ORDER — SODIUM CHLORIDE 0.9 % IJ SOLN: 3.0000 mL | INTRAMUSCULAR | Status: DC | PRN

## 2015-05-29 MED ORDER — THROMBIN 5000 UNITS EX SOLR
CUTANEOUS | Status: DC | PRN
  Administered 2015-05-29 (×2): 5000 [IU] via TOPICAL

## 2015-05-29 MED ORDER — SODIUM CHLORIDE 0.9 % IJ SOLN
3.0000 mL | Freq: Two times a day (BID) | INTRAMUSCULAR | Status: DC
  Administered 2015-05-29: 3 mL via INTRAVENOUS

## 2015-05-29 MED ORDER — MELATONIN 3 MG PO TABS: 3.0000 mg | ORAL_TABLET | Freq: Every day | ORAL | Status: DC

## 2015-05-29 MED ORDER — ARTIFICIAL TEARS OP OINT
TOPICAL_OINTMENT | OPHTHALMIC | Status: AC
  Filled 2015-05-29: qty 3.5

## 2015-05-29 MED ORDER — THROMBIN 5000 UNITS EX SOLR
CUTANEOUS | Status: DC | PRN
  Administered 2015-05-29: 07:00:00 via TOPICAL

## 2015-05-29 MED ORDER — METOPROLOL TARTRATE 12.5 MG HALF TABLET
12.5000 mg | ORAL_TABLET | Freq: Two times a day (BID) | ORAL | Status: DC
  Administered 2015-05-29: 12.5 mg via ORAL
  Filled 2015-05-29 (×3): qty 1

## 2015-05-29 MED ORDER — EPHEDRINE SULFATE 50 MG/ML IJ SOLN
INTRAMUSCULAR | Status: AC
  Filled 2015-05-29: qty 1

## 2015-05-29 MED ORDER — CYCLOBENZAPRINE HCL 5 MG PO TABS
5.0000 mg | ORAL_TABLET | Freq: Three times a day (TID) | ORAL | Status: DC | PRN
  Administered 2015-05-29: 5 mg via ORAL
  Filled 2015-05-29: qty 1

## 2015-05-29 MED ORDER — SODIUM CHLORIDE 0.9 % IV SOLN
250.0000 mL | INTRAVENOUS | Status: DC
Start: 2015-05-29 — End: 2015-05-30

## 2015-05-29 MED ORDER — HYDROMORPHONE HCL 1 MG/ML IJ SOLN: 0.5000 mg | INTRAMUSCULAR | Status: DC | PRN

## 2015-05-29 MED ORDER — LIDOCAINE HCL (CARDIAC) 20 MG/ML IV SOLN
INTRAVENOUS | Status: DC | PRN
  Administered 2015-05-29: 80 mg via INTRAVENOUS
  Administered 2015-05-29: 50 mg via INTRATRACHEAL

## 2015-05-29 MED ORDER — DEXAMETHASONE SODIUM PHOSPHATE 10 MG/ML IJ SOLN
10.0000 mg | INTRAMUSCULAR | Status: AC
  Administered 2015-05-29: 10 mg via INTRAVENOUS

## 2015-05-29 MED ORDER — RISAQUAD PO CAPS
1.0000 | ORAL_CAPSULE | Freq: Every day | ORAL | Status: DC
  Administered 2015-05-29: 1 via ORAL
  Filled 2015-05-29 (×2): qty 1

## 2015-05-29 MED ORDER — PROBIOTIC DAILY PO CAPS: ORAL_CAPSULE | Freq: Every day | ORAL | Status: DC

## 2015-05-29 MED ORDER — EPHEDRINE SULFATE 50 MG/ML IJ SOLN
INTRAMUSCULAR | Status: DC | PRN
  Administered 2015-05-29: 5 mg via INTRAVENOUS
  Administered 2015-05-29: 15 mg via INTRAVENOUS
  Administered 2015-05-29 (×3): 5 mg via INTRAVENOUS

## 2015-05-29 MED ORDER — LACTATED RINGERS IV SOLN
INTRAVENOUS | Status: DC | PRN
  Administered 2015-05-29 (×2): via INTRAVENOUS

## 2015-05-29 MED ORDER — ONDANSETRON HCL 4 MG/2ML IJ SOLN: 4.0000 mg | INTRAMUSCULAR | Status: DC | PRN

## 2015-05-29 SURGICAL SUPPLY — 58 items
APL SKNCLS STERI-STRIP NONHPOA (GAUZE/BANDAGES/DRESSINGS) ×1
BAG DECANTER FOR FLEXI CONT (MISCELLANEOUS) ×2 IMPLANT
BENZOIN TINCTURE PRP APPL 2/3 (GAUZE/BANDAGES/DRESSINGS) ×2 IMPLANT
BIT DRILL SPINE QC 12 (BIT) ×1 IMPLANT
BRUSH SCRUB EZ PLAIN DRY (MISCELLANEOUS) ×2 IMPLANT
BUR MATCHSTICK NEURO 3.0 LAGG (BURR) ×2 IMPLANT
CANISTER SUCT 3000ML PPV (MISCELLANEOUS) ×2 IMPLANT
DRAPE C-ARM 42X72 X-RAY (DRAPES) ×4 IMPLANT
DRAPE LAPAROTOMY 100X72 PEDS (DRAPES) ×2 IMPLANT
DRAPE MICROSCOPE LEICA (MISCELLANEOUS) ×2 IMPLANT
DRAPE POUCH INSTRU U-SHP 10X18 (DRAPES) ×2 IMPLANT
DRSG OPSITE POSTOP 4X6 (GAUZE/BANDAGES/DRESSINGS) ×2 IMPLANT
DURAPREP 6ML APPLICATOR 50/CS (WOUND CARE) ×2 IMPLANT
ELECT COATED BLADE 2.86 ST (ELECTRODE) ×2 IMPLANT
ELECT REM PT RETURN 9FT ADLT (ELECTROSURGICAL) ×2
ELECTRODE REM PT RTRN 9FT ADLT (ELECTROSURGICAL) ×1 IMPLANT
GAUZE SPONGE 4X4 12PLY STRL (GAUZE/BANDAGES/DRESSINGS) ×2 IMPLANT
GAUZE SPONGE 4X4 16PLY XRAY LF (GAUZE/BANDAGES/DRESSINGS) IMPLANT
GLOVE BIO SURGEON STRL SZ8 (GLOVE) ×2 IMPLANT
GLOVE BIOGEL M 8.0 STRL (GLOVE) ×2 IMPLANT
GLOVE ECLIPSE 7.5 STRL STRAW (GLOVE) ×2 IMPLANT
GLOVE EXAM NITRILE LRG STRL (GLOVE) IMPLANT
GLOVE EXAM NITRILE MD LF STRL (GLOVE) IMPLANT
GLOVE EXAM NITRILE XL STR (GLOVE) IMPLANT
GLOVE EXAM NITRILE XS STR PU (GLOVE) IMPLANT
GLOVE INDICATOR 8.0 STRL GRN (GLOVE) ×1 IMPLANT
GLOVE INDICATOR 8.5 STRL (GLOVE) ×2 IMPLANT
GOWN STRL REUS W/ TWL LRG LVL3 (GOWN DISPOSABLE) IMPLANT
GOWN STRL REUS W/ TWL XL LVL3 (GOWN DISPOSABLE) ×1 IMPLANT
GOWN STRL REUS W/TWL 2XL LVL3 (GOWN DISPOSABLE) IMPLANT
GOWN STRL REUS W/TWL LRG LVL3 (GOWN DISPOSABLE)
GOWN STRL REUS W/TWL XL LVL3 (GOWN DISPOSABLE) ×2
HALTER HD/CHIN CERV TRACTION D (MISCELLANEOUS) ×2 IMPLANT
HEMOSTAT POWDER KIT SURGIFOAM (HEMOSTASIS) ×2 IMPLANT
KIT BASIN OR (CUSTOM PROCEDURE TRAY) ×2 IMPLANT
KIT ROOM TURNOVER OR (KITS) ×2 IMPLANT
LIQUID BAND (GAUZE/BANDAGES/DRESSINGS) ×1 IMPLANT
NDL SPNL 20GX3.5 QUINCKE YW (NEEDLE) ×1 IMPLANT
NEEDLE SPNL 20GX3.5 QUINCKE YW (NEEDLE) ×2 IMPLANT
NS IRRIG 1000ML POUR BTL (IV SOLUTION) ×2 IMPLANT
PACK LAMINECTOMY NEURO (CUSTOM PROCEDURE TRAY) ×2 IMPLANT
PAD ARMBOARD 7.5X6 YLW CONV (MISCELLANEOUS) ×6 IMPLANT
PLATE ANT CERV XTEND 2 LV 26 (Plate) ×1 IMPLANT
RUBBERBAND STERILE (MISCELLANEOUS) ×4 IMPLANT
SCREW XTD VAR 4.2 SELF TAP 12 (Screw) ×6 IMPLANT
SPACER CERV FRGE 12X14X5-0 (Spacer) ×1 IMPLANT
SPACER CERVICAL FRGE 12X14X6-7 (Spacer) ×1 IMPLANT
SPONGE INTESTINAL PEANUT (DISPOSABLE) ×2 IMPLANT
SPONGE SURGIFOAM ABS GEL SZ50 (HEMOSTASIS) ×2 IMPLANT
STRIP CLOSURE SKIN 1/2X4 (GAUZE/BANDAGES/DRESSINGS) ×2 IMPLANT
SUT VIC AB 3-0 SH 8-18 (SUTURE) ×2 IMPLANT
SUT VICRYL 4-0 PS2 18IN ABS (SUTURE) ×2 IMPLANT
SYR 20ML ECCENTRIC (SYRINGE) ×2 IMPLANT
TAPE CLOTH 4X10 WHT NS (GAUZE/BANDAGES/DRESSINGS) IMPLANT
TOWEL OR 17X24 6PK STRL BLUE (TOWEL DISPOSABLE) ×2 IMPLANT
TOWEL OR 17X26 10 PK STRL BLUE (TOWEL DISPOSABLE) ×2 IMPLANT
TRAP SPECIMEN MUCOUS 40CC (MISCELLANEOUS) ×2 IMPLANT
WATER STERILE IRR 1000ML POUR (IV SOLUTION) ×2 IMPLANT

## 2015-05-29 NOTE — H&P (Signed)
Bridget Lane is an 81 y.o. female.   Chief Complaint: Neck pain left shoulder pain HPI: Patient is a very pleasant 81 year old female is a progress worsening neck and left shoulder pain this refractory to all forms of conservative treatment workup revealed instability spondylolisthesis at C4-5 and stenosis C5-6. Due to patient's failed conservative treatment imaging findings progression of clinical syndrome I recommended anterior cervical discectomies and fusion at C4-5 and C5-6 extensively reviewed the risks and benefits of the operation with the patient as well as perioperative course expectations of outcome and alternatives surgery and she understood and agreed to proceed forward.  Past Medical History  Diagnosis Date  . OA (osteoarthritis)   . DJD (degenerative joint disease)   . Macular degeneration   . Depression   . Colon polyp 2005  . Esophageal stricture   . Hyperlipidemia   . H. pylori infection   . GERD (gastroesophageal reflux disease)   . Nontoxic multinodular goiter   . Hiatal hernia   . Atrial fibrillation     persistent  . Postherpetic neuralgia at T3-T5 level 04/27/2011  . Rectal bleeding 07/06/2014    Hemorrhoid related in past.    . Dysrhythmia     Past Surgical History  Procedure Laterality Date  . Appendectomy    . Tubal ligation      with appendectomy  . Cholecystectomy  1989  . Total abdominal hysterectomy  1972    for pain (no BSO)  . Esophageal dilation      > 3 X; Dr Olevia Perches  . Colonoscopy w/ polypectomy  2005    Neg in 2010; Dr Olevia Perches  . Cardioversion  08/29/2011    Procedure: CARDIOVERSION;  Surgeon: Bing Quarry, MD;  Location: [facility];  Service: Cardiovascular;  Laterality: N/A;  . Cardioversion  11/22/2011    Procedure: CARDIOVERSION;  Surgeon: Coralyn Mark, MD;  Location: [facility];  Service: Cardiovascular;  Laterality: N/A;  . Upper gi endoscopy  2010    H pylori  . Left and right heart catheterization with coronary angiogram N/A 12/02/2011   Procedure: LEFT AND RIGHT HEART CATHETERIZATION WITH CORONARY ANGIOGRAM;  Surgeon: Jolaine Artist, MD;  Location: [facility] CATH LAB;  Service: Cardiovascular;  Laterality: N/A;  . Cardiac catheterization      Family History  Problem Relation Age of Onset  . Colon cancer Father     in 84s  . Osteoarthritis Father   . Coronary artery disease Father   . Heart failure Mother   . Coronary artery disease Mother   . Diabetes Mother   . Breast cancer Sister   . Diabetes Sister   . Stroke Sister   . Coronary artery disease Brother   . Throat cancer Brother     smoked  . Breast cancer Maternal Aunt   . Hypothyroidism Sister    Social History:  reports that she has never smoked. She has never used smokeless tobacco. She reports that she does not drink alcohol or use illicit drugs.  Allergies:  Allergies  Allergen Reactions  . Penicillins Rash  . Codeine Other (See Comments)    hallucinations  . Acetaminophen Other (See Comments)    hurts stomach  . Dabigatran Etexilate Mesylate Other (See Comments)     All extremities feel heavy and hurt  . Sulfonamide Derivatives Rash    Medications Prior to Admission  Medication Sig Dispense Refill  . docusate sodium (COLACE) 100 MG capsule Take 100 mg by mouth daily as needed for mild  constipation.    Marland Kitchen ELIQUIS 5 MG TABS tablet TAKE 1 TABLE BY MOUTH TWICE DAILY 60 tablet 10  . flecainide (TAMBOCOR) 100 MG tablet TAKE 1 TABLET (100 MG TOTAL) BY MOUTH 2 (TWO) TIMES DAILY. 180 tablet 0  . Melatonin 3 MG TABS Take 3 mg by mouth at bedtime.     . Menthol, Topical Analgesic, (ASPERCREME HEAT EX) Apply 1 application topically daily as needed.    . Menthol, Topical Analgesic, (BIOFREEZE EX) Apply 1 application topically daily as needed.    . metoprolol tartrate (LOPRESSOR) 25 MG tablet TAKE 1/2 TABLET TWICE DAILY 90 tablet 1  . Multiple Vitamin (MULTIVITAMIN) tablet Take 1 tablet by mouth daily.    Marland Kitchen omeprazole (PRILOSEC) 40 MG capsule Take 1 capsule (40  mg total) by mouth daily. 30 capsule 6  . Probiotic Product (PROBIOTIC DAILY PO) Take 1 capsule by mouth daily.      No results found for this or any previous visit (from the past 48 hour(s)). No results found.  Review of Systems  Constitutional: Negative.   Eyes: Negative.   Respiratory: Negative.   Cardiovascular: Negative.   Gastrointestinal: Negative.   Genitourinary: Negative.   Musculoskeletal: Positive for myalgias and neck pain.  Skin: Negative.   Neurological: Positive for headaches.  Endo/Heme/Allergies: Negative.   Psychiatric/Behavioral: Negative.     Blood pressure 142/60, pulse 59, temperature 97 F (36.1 C), temperature source Oral, resp. rate 18, height 5' 3.5" (1.613 m), weight 75.297 kg (166 lb), SpO2 98 %. Physical Exam  Constitutional: She is oriented to person, place, and time. She appears well-developed and well-nourished.  HENT:  Head: Normocephalic.  Eyes: Pupils are equal, round, and reactive to light.  Neck: Normal range of motion.  Respiratory: Effort normal.  GI: Soft.  Neurological: She is alert and oriented to person, place, and time. She has normal strength. GCS eye subscore is 4. GCS verbal subscore is 5. GCS motor subscore is 6.  Strength is 5 out of 5 in her deltoid, bicep, tricep, wrist flexion, wrist extension, and intrinsics.  Skin: Skin is warm and dry.     Assessment/Plan 81 year old female presents for an ACDF at C4-5 C5-6.  Chaske Paskett P 05/29/2015, 7:17 AM

## 2015-05-29 NOTE — Op Note (Signed)
Preoperative Diagnosis: Cervical spondylosis with stenosis and instability C4-5 C5-6  Postoperative diagnosis: Same  Procedure: Anterior cervical discectomies and fusion at C4-5 and C5-6 using allograft wedges in the globus extend plating system with 6 fixed angle 14 mm screws  Surgeon: Dominica Severin Diara Chaudhari  Asst.: Leeroy Cha  Anesthesia: Gen.  EBL: Minimal  History of present illness: Patient is a very pleasant 81 year old female a because worsening neck and bilateral shoulder pain worse on the left left side of her face. Workup has revealed instability at C4-5 with movement on flexion-extension stenosis and spondylosis at C4-5 and C5-6. Due to patient's failure of conservative treatment imaging findings and progression of clinical syndrome I recommended anterior cervical discectomies and fusion at these 2 levels. I extensively went over the risks and benefits of the operation with the patient as well as perioperative course expectations of outcome and alternatives of surgery and she understood and agreed to proceed forward.  Operative procedure: Patient brought into the or was induced on general anesthesia positioned supine the neck in slight extension in 5 pounds all distraction rest 7 neck was prepped and draped in routine sterile fashion preoperative x-ray localize the appropriate level so a curvilinear incision was made just off the midline to the interbody the sternomastoid superficial layer of the platysmas dissected and divided longitudinally the avascular tissue, some strap muscles was divided down to the prevertebral fascia. Fascia dissected away with Kitners. Interoperative x-ray identified the appropriate level annulotomy was made at both levels large anterior ossified to bitten off at C5-6 with Leksell rongeur longus Landry Mellow he was reflected laterally and self-retaining retractor was placed. Both disc spaces were drilled down the posterior annulus and osteophytic complex under microscopic  illumination first working at C5-6 the posterior spur was further drilled down aggressive under biting of both endplates allowed identification of the PLL which was removed in piecemeal fashion decompressed in this thecal sac. Marking laterally both C6 pedicles were identified and both C6 nerve roots were skeletonized flush with pedicle at the end of decompression was no further stenosis either centrally or foraminally. At C4-5 and a similar fashion this disc space was drilled down posterior aspect were drilled down aggressive undergoing of both endplates decompress the central canal removing the PLL marching laterally both C5 nerve roots were identified and decompressed flush with pedicle. After adequate decompression been achieved at both levels endplates were scraped to close and states was maintained a 6 mm lordotic wedges was inserted C4-5 of 5 mm parallel C5-6 and 28 mm globus extend plate was placed all screws excellent purchase locking mechanism was engaged. The wound was in to see her good fixing space was maintained the wounds and closed in layers after Vicryls skin is closed running 4 subcuticular benzoin and Steri-Strips were applied patient recovered in stable condition. At the end of case all needle counts sponge counts were correct.

## 2015-05-29 NOTE — Progress Notes (Signed)
PHARMACIST - PHYSICIAN ORDER COMMUNICATION  CONCERNING: P&T Medication Policy on Herbal Medications  DESCRIPTION:  This patient's order for:  melatonin  has been noted.  This product(s) is classified as an "herbal" or natural product. Due to a lack of definitive safety studies or FDA approval, nonstandard manufacturing practices, plus the potential risk of unknown drug-drug interactions while on inpatient medications, the Pharmacy and Therapeutics Committee does not permit the use of "herbal" or natural products of this type within [facility].   ACTION TAKEN: The pharmacy department is unable to verify this order at this time and your patient has been informed of this safety policy. Please reevaluate patient's clinical condition at discharge and address if the herbal or natural product(s) should be resumed at that time.  Uvaldo Rising, BCPS  Clinical Pharmacist Pager [phone]  05/29/2015 10:54 AM

## 2015-05-29 NOTE — Transfer of Care (Signed)
Immediate Anesthesia Transfer of Care Note  Patient: Bridget Lane  Procedure(s) Performed: Procedure(s): ANTERIOR CERVICAL DECOMPRESSION/DISCECTOMY FUSION CERVICAL FOUR-FIVE,CERVICAL FIVE-SIX (N/A)  Patient Location: PACU  Anesthesia Type:General  Level of Consciousness: awake, alert  and oriented  Airway & Oxygen Therapy: Patient Spontanous Breathing and Patient connected to nasal cannula oxygen  Post-op Assessment: Report given to RN, Post -op Vital signs reviewed and stable and Patient moving all extremities X 4  Post vital signs: Reviewed and stable  Last Vitals:  Filed Vitals:   05/29/15 0947  BP:   Pulse:   Temp: 36.5 C  Resp:     Complications: No apparent anesthesia complications

## 2015-05-29 NOTE — Care Management (Signed)
Utilization review completed. Krystale Rinkenberger, RN Case Manager [phone]. 

## 2015-05-29 NOTE — Anesthesia Postprocedure Evaluation (Signed)
  Anesthesia Post-op Note  Patient: Bridget Lane  Procedure(s) Performed: Procedure(s): ANTERIOR CERVICAL DECOMPRESSION/DISCECTOMY FUSION CERVICAL FOUR-FIVE,CERVICAL FIVE-SIX (N/A)  Patient Location: PACU  Anesthesia Type: General   Level of Consciousness: awake, alert  and oriented  Airway and Oxygen Therapy: Patient Spontanous Breathing  Post-op Pain: mild  Post-op Assessment: Post-op Vital signs reviewed  Post-op Vital Signs: Reviewed  Last Vitals:  Filed Vitals:   05/29/15 1619  BP: 122/53  Pulse: 61  Temp: 36.5 C  Resp: 16    Complications: No apparent anesthesia complications

## 2015-05-29 NOTE — Progress Notes (Addendum)
Pt states states she ate 1/2 Lemon Icee this am about 0300. Neuro OR called and informed, note left on chart.  Attempted to call Dr Kalman Shan to inform him. Willie in Neuro states he told Dr Kalman Shan.

## 2015-05-29 NOTE — Anesthesia Procedure Notes (Signed)
Procedure Name: Intubation Date/Time: 05/29/2015 7:35 AM Performed by: [place] Pink Pre-anesthesia Checklist: Patient identified, Emergency Drugs available, Suction available, Patient being monitored and Timeout performed Patient Re-evaluated:Patient Re-evaluated prior to inductionOxygen Delivery Method: Circle system utilized Preoxygenation: Pre-oxygenation with 100% oxygen Intubation Type: IV induction and Rapid sequence Laryngoscope Size: Mac and 3 Grade View: Grade I Tube type: Oral Tube size: 7.0 mm Number of attempts: 1 Airway Equipment and Method: Stylet and LTA kit utilized Placement Confirmation: ETT inserted through vocal cords under direct vision,  positive ETCO2 and breath sounds checked- equal and bilateral Secured at: 20 cm Tube secured with: Tape Dental Injury: Teeth and Oropharynx as per pre-operative assessment

## 2015-05-29 NOTE — Anesthesia Preprocedure Evaluation (Signed)
Anesthesia Evaluation  Patient identified by MRN, date of birth, ID band Patient awake    Reviewed: Allergy & Precautions, NPO status , Patient's Chart, lab work & pertinent test results  Airway Mallampati: II  TM Distance: >3 FB Neck ROM: Full    Dental no notable dental hx.    Pulmonary sleep apnea ,  breath sounds clear to auscultation  Pulmonary exam normal       Cardiovascular Normal cardiovascular exam+ dysrhythmias Atrial Fibrillation Rhythm:Regular Rate:Normal     Neuro/Psych negative neurological ROS  negative psych ROS   GI/Hepatic Neg liver ROS, GERD-  Medicated,  Endo/Other  negative endocrine ROS  Renal/GU negative Renal ROS  negative genitourinary   Musculoskeletal negative musculoskeletal ROS (+)   Abdominal   Peds negative pediatric ROS (+)  Hematology negative hematology ROS (+)   Anesthesia Other Findings   Reproductive/Obstetrics negative OB ROS                             Anesthesia Physical Anesthesia Plan  ASA: III  Anesthesia Plan: General   Post-op Pain Management:    Induction: Intravenous  Airway Management Planned: Oral ETT  Additional Equipment:   Intra-op Plan:   Post-operative Plan: Extubation in OR  Informed Consent: I have reviewed the patients History and Physical, chart, labs and discussed the procedure including the risks, benefits and alternatives for the proposed anesthesia with the patient or authorized representative who has indicated his/her understanding and acceptance.   Dental advisory given  Plan Discussed with: CRNA and Surgeon  Anesthesia Plan Comments:         Anesthesia Quick Evaluation

## 2015-05-30 ENCOUNTER — Encounter (HOSPITAL_COMMUNITY): Payer: Self-pay | Admitting: Neurosurgery

## 2015-05-30 MED ORDER — OXYCODONE-ACETAMINOPHEN 5-325 MG PO TABS: 1.0000 | ORAL_TABLET | ORAL | Status: DC | PRN

## 2015-05-30 NOTE — Progress Notes (Signed)
Pt doing well. Pt given D/C instructions with Rx, verbal understanding was provided. Pt's IV was removed prior to D/C. Pt's incision is clean and dry with no sign of infection. Pt D/C'd home via wheelchair @ 0900 per MD order. Pt is stable @ D/C and has no other needs at this time. Holli Humbles, RN

## 2015-05-30 NOTE — Discharge Summary (Signed)
  Physician Discharge Summary  Patient ID: SAE HANDRICH MRN: [id number] DOB/AGE: [birth year] 81 y.o.  Admit date: 05/29/2015 Discharge date: 05/30/2015  Admission Diagnoses: Cervical spondylosis and stenosis C4-5 C5-6  Discharge Diagnoses: Same Active Problems:   Spinal stenosis of cervical region   Discharged Condition: good  Hospital Course: Patient is [facility] underwent ACDF at C4-5 C5-6 postoperatively patient did very well recovered in the floor on the floor was angling and voiding spontaneously tolerating regular diet was stable for discharge home.  Consults: Significant Diagnostic Studies: Treatments: ACDF C4-5 C5-6 Discharge Exam: Blood pressure 138/60, pulse 64, temperature 97.7 F (36.5 C), temperature source Oral, resp. rate 18, height 5' 3.5" (1.613 m), weight 75.297 kg (166 lb), SpO2 97 %. Strength out of 5 wound clean dry and intact  Disposition: Home     Medication List    TAKE these medications        BIOFREEZE EX  Apply 1 application topically daily as needed.     ASPERCREME HEAT EX  Apply 1 application topically daily as needed.     docusate sodium 100 MG capsule  Commonly known as:  COLACE  Take 100 mg by mouth daily as needed for mild constipation.     ELIQUIS 5 MG Tabs tablet  Generic drug:  apixaban  TAKE 1 TABLE BY MOUTH TWICE DAILY     flecainide 100 MG tablet  Commonly known as:  TAMBOCOR  TAKE 1 TABLET (100 MG TOTAL) BY MOUTH 2 (TWO) TIMES DAILY.     Melatonin 3 MG Tabs  Take 3 mg by mouth at bedtime.     metoprolol tartrate 25 MG tablet  Commonly known as:  LOPRESSOR  TAKE 1/2 TABLET TWICE DAILY     multivitamin tablet  Take 1 tablet by mouth daily.     omeprazole 40 MG capsule  Commonly known as:  PRILOSEC  Take 1 capsule (40 mg total) by mouth daily.     oxyCODONE-acetaminophen 5-325 MG per tablet  Commonly known as:  PERCOCET/ROXICET  Take 1-2 tablets by mouth every 4 (four) hours as needed for moderate pain.      PROBIOTIC DAILY PO  Take 1 capsule by mouth daily.           Follow-up Information    Follow up with [facility], MD.   Specialty:  Neurosurgery   Contact information:   [address] [place] [address] [phone]       Signed: Elaina Hoops 05/30/2015, 7:16 AM

## 2015-05-30 NOTE — Progress Notes (Signed)
Patient ID: Bridget Lane, female   DOB: [birth year], 81 y.o.   MRN: [id number] Doing well discharged home

## 2015-05-30 NOTE — Discharge Instructions (Signed)
No lifting no bending no twisting no driving a riding a car unless she is coming back and forth to see me.  Wound Care Keep incision covered and dry for one week.  If you shower prior to then, cover incision with plastic wrap.  You may remove outer bandage after one week and shower.  Do not put any creams, lotions, or ointments on incision. Leave steri-strips on neck.  They will fall off by themselves. Activity Walk each and every day, increasing distance each day. No lifting greater than 5 lbs.  Avoid excessive neck motion. No driving for 2 weeks; may ride as a passenger locally. Wear neck brace at all times except when showering or otherwise instructed. Diet Resume your normal diet.  Return to Work Will be discussed at you follow up appointment. Call Your Doctor If Any of These Occur Redness, drainage, or swelling at the wound.  Temperature greater than 101 degrees. Severe pain not relieved by pain medication. Increased difficulty swallowing.  Incision starts to come apart. Follow Up Appt Call today for appointment in 1-2 weeks ([phone]) or for problems.  If you have any hardware placed in your spine, you will need an x-ray before your appointment.   Anterior Cervical Diskectomy and Fusion Anterior cervical diskectomy is surgery done on the upper spine to relieve pressure on one or more nerve roots, or on the spinal cord. There are 7 bones in your neck, called the cervical spine. These 7 bones (vertebrae) sit one on top of the other. Cushions (intervertebral disks) separate the vertebrae and act like shock absorbers. As we age, degeneration of our bones, joints, and disks can cause neck pain and tightening around the spinal cord and nerve roots. This causes arm pain and weakness.  Degeneration involves:  Herniated Disk. With age, the disks dry up and can rupture. In this condition, the center of the disk bulges out (disk herniation). This can cause pressure on a nerve, which produces  pain or weakness in the arm.  Bone spurs and spinal stenosis. As we age, growths often develop on our bones. These growths are called bone spurs (osteophytes). A bone spur is a collection of calcium. As bone spurs grow and extend, the vertebral openings become narrow. The spinal canal and/or the foramen (opening for nerve passageways) become smaller. This narrowing (stenosis) may cause pinching (compression) of the spinal cord or the spinal nerve root. The nerve injury can cause pain, weakness, numbness, and loss of coordination in the upper limbs. Often, patients have difficulty with their hand writing or they start dropping things, because their hand grip is weaker. The spinal cord damage can cause increased stiffness, more frequent falls, electric shooting pain, and changes in bowel and bladder control. Degeneration in the neck results in three common problems:  Radiculopathy - Nerve compression that results in weakness or pain that radiates down the arm.  Myelopathy - Spinal cord compression that causes stiffness, difficulty with walking, coordination, and trouble with bowel or bladder habits.  Neck pain - Worn out joints cause pain as the neck moves. Treatment:  Radiculopathy - Surgery is performed to remove the bony and disk material that is pushing on the nerve.  Myelopathy - Surgery is performed to remove the bony and disk material that pushes on the spinal cord.  Neck pain - Surgery is performed to combine (fuse) the joints of the neck together, so they cannot move or cause pain. Surgery can be done from the front or the back of  the neck. When it is done from the front, it is called an anterior (front) cervical (neck) diskectomy (removal of the disk) and fusion. LET YOUR CAREGIVER KNOW ABOUT:   Recent infections.  Any shooting pains down your leg, when you move your neck.  Any difficulty swallowing.  A smoking history.  Use of blood thinners or anti-inflammatory medicines.  Any  history of injury to your shoulders.  Any history of injury to your vocal cords.  Any foreign objects in your body from a previous surgery.  Any recent fevers or illness.  Past medical history (diabetes, strokes).  Past problems with anesthetics.  Possibility of pregnancy.  History of blood clots (deep vein thrombosis).  History of bleeding or blood problems.  Past surgeries.  Other health problems.  Allergies.  Medicines you take, including herbs, eye drops, over-the-counter medicines, and creams.  Use of steroids (by mouth or creams). RISKS AND COMPLICATIONS  Infection.  Bleeding.  Injury to the following structures:  Carotid artery. This can result in a stroke or significant amount of bleeding.  Esophagus, resulting in difficulty swallowing.  Recurrent laryngeal nerve, resulting in hoarseness of the voice.  Spinal cord injury, ranging from mild to complete quadriparesis (muscle weakness in all four limbs).  Nerve root injury, resulting in muscle weakness in the upper limb.  Leakage of cerebrospinal fluid. BEFORE THE PROCEDURE   You will be given medicine to help you sleep (general anesthetic), and a breathing tube will be placed.  You will be given antibiotics to keep the infection rate down.  The incision site on your neck will be marked.  Your neck will be cleaned, to reduce the risk of infection. PROCEDURE  An anterior cervical fusion means that the operation is done through the front (anterior) part of your neck. The cut made by the surgeon (incision) is usually within a skin fold line on the neck. After pushing aside the neck muscles, the surgeon removes the affected, degenerated disk and bone spurs (osteophytes), which takes the pressure off the nerves and spinal cord. This is called a decompression. The area where the disk was removed is then filled with a small piece of plastic. This plastic takes the place of the disk and keeps the nerve passageway  (foramen) open and clear for the nerves. In most cases, the surgeon uses metal plates or pins (hardware) in the neck, to help stabilize the level being fused. The hardware reduces motion at that level, so it can fuse. This provides extra support to the neck. A cervical fusion procedure takes anywhere from a couple to several hours, depending on the size of the neck, history of previous surgery, and number of levels being fused. AFTER THE PROCEDURE   You will likely spend 24-48 hours in the hospital. During this time, your caregivers will look for any signs of complications from the procedure.  Your caregiver will watch you, to make sure that fluid draining from the surgery slows down. It is important that a large mass of blood does not form in your neck, which would cause difficulty with breathing.  You will get 24 hours of antibiotics.  You can start to eat as soon as you feel comfortable.  Once you have started eating, walking, urinating (voiding) and having bowel movements on your own, your caregiver will discharge you home. HOME CARE INSTRUCTIONS   For 2 weeks, do not soak the incision site under water. Do not swim or take baths. Showers are okay, but rinse off the  incision sites.  Do not over exert yourself. Allow time for the incision to heal.  It can take from 6 weeks to 6 months for fusion to take effect. Your caregiver may ask you to wear a neck collar during this time, as they check the fusion with multiple (serial) X-rays. Document Released: 09/25/2009 Document Revised: 02/01/2013 Document Reviewed: 09/25/2009 [facility] Patient Information 2015 [place], [place]. This information is not intended to replace advice given to you by your health care provider. Make sure you discuss any questions you have with your health care provider.

## 2015-07-05 ENCOUNTER — Other Ambulatory Visit: Payer: Self-pay | Admitting: Cardiology

## 2015-07-10 ENCOUNTER — Other Ambulatory Visit: Payer: Self-pay | Admitting: Cardiology

## 2015-07-13 ENCOUNTER — Telehealth: Payer: Self-pay | Admitting: *Deleted

## 2015-07-13 NOTE — Telephone Encounter (Signed)
Called patient about a fax from [facility] for 90 day refill for Omeprazole DR 40 mg.  She said she just got refills from Omeprazole 40 mg.  I did advise her to choose another doctor since Dr. Olevia Perches retired as of 06-15-2015.  I told the patient about Dr. Silverio Decamp and Dr. Havery Moros.  The patient chose Dr. Silverio Decamp and made an appointment for 08-29-2015 at 1:45 PM for Abdominal pain.

## 2015-07-21 ENCOUNTER — Ambulatory Visit (INDEPENDENT_AMBULATORY_CARE_PROVIDER_SITE_OTHER): Payer: Medicare Other | Admitting: Cardiology

## 2015-07-21 ENCOUNTER — Encounter: Payer: Self-pay | Admitting: Cardiology

## 2015-07-21 VITALS — BP 124/70 | HR 69 | Ht 63.5 in | Wt 165.0 lb

## 2015-07-21 DIAGNOSIS — I480 Paroxysmal atrial fibrillation: Secondary | ICD-10-CM | POA: Diagnosis not present

## 2015-07-21 MED ORDER — ZOLPIDEM TARTRATE 5 MG PO TABS: 5.0000 mg | ORAL_TABLET | Freq: Every evening | ORAL | Status: DC | PRN

## 2015-07-21 NOTE — Patient Instructions (Addendum)
Medication Instructions:  Dr Aundra Dubin has given you a prescription for Ambien 5mg  to take at night as needed for sleep.  If this does not help or you need additional refills Dr Aundra Dubin recommends you contact your primary care doctor.   Labwork: None today  Testing/Procedures: None today  Follow-Up: Your physician wants you to follow-up in: 6 months with Kathrene Alu. (March 2017). You will receive a reminder letter in the mail two months in advance. If you don't receive a letter, please call our office to schedule the follow-up appointment.   Your physician wants you to follow-up in: 1 year with Dr Aundra Dubin. (September 2017). You will receive a reminder letter in the mail two months in advance. If you don't receive a letter, please call our office to schedule the follow-up appointment.

## 2015-07-23 NOTE — Progress Notes (Signed)
Patient ID: Bridget Lane, female   DOB: [birth year], 81 y.o.   MRN: [id number] PCP: Dr. Yong Channel  81 yo with history of paroxysmal atrial fibrillation presents for followup. She is on apixaban and flecainide.  She had one recent episode of palpitations about 2 weeks ago at night.  She has been taking metoprolol with no problems (prior history of bradycardia).  Today, she is in NSR.  No exertional dyspnea.  No exertional chest pain. No rectal bleeding or melena on Eliquis. She had C-spine surgery earlier this year and still has neck pain. She has been having difficulty sleeping.   ECG: NSR, normal  Labs (4/14): Flecainide level normal, K 4.1, creatinine 0.8 Labs (10/14): LFTs normal, TSH normal, HCT 38.8 Labs (3/15): K 4.4, creatinine 1.1, HCT 37.4 Labs (8/15): LDL 143, HDL 47 Labs (8/16): HCT 39.7, K 4.8, creatinine 1.08  PMH: 1. BPPV 2. OA 3. GERD 4. Hyperlipidemia 5. Paroxysmal atrial fibrillation 6. Multinodular goiter 7. Cholecystectomy 8. Appendectomy 9. Echo (7/13) with EF 55-60% 10. LHC/RHC (7/13) with mild nonobstructive CAD, LAD bridging; mean RA 4, PA 28/7, mean PCWP 7. 11. Esophageal dysmotility.  12. 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   SH: Married, nonsmoker, lives in [place]  FH: No premature CAD  Current Outpatient Prescriptions  Medication Sig Dispense Refill  . docusate sodium (COLACE) 100 MG capsule Take 100 mg by mouth daily as needed for mild constipation.    Marland Kitchen ELIQUIS 5 MG TABS tablet TAKE 1 TABLE BY MOUTH TWICE DAILY 60 tablet 0  . flecainide (TAMBOCOR) 100 MG tablet TAKE 1 TABLET (100 MG TOTAL) BY MOUTH 2 (TWO) TIMES DAILY. 180 tablet 0  . Melatonin 3 MG TABS Take 3 mg by mouth at bedtime.     . Menthol, Topical Analgesic, (ASPERCREME HEAT EX) Apply 1 application topically daily as needed.    . Menthol, Topical Analgesic, (BIOFREEZE EX) Apply 1 application topically daily as needed.    . metoprolol tartrate (LOPRESSOR) 25 MG tablet TAKE 1/2 TABLET TWICE  DAILY 90 tablet 1  . Multiple Vitamin (MULTIVITAMIN) tablet Take 1 tablet by mouth daily.    Marland Kitchen omeprazole (PRILOSEC) 40 MG capsule Take 1 capsule (40 mg total) by mouth daily. 30 capsule 6  . Probiotic Product (PROBIOTIC DAILY PO) Take 1 capsule by mouth daily.    Marland Kitchen zolpidem (AMBIEN) 5 MG tablet Take 1 tablet (5 mg total) by mouth at bedtime as needed for sleep. 30 tablet 0   No current facility-administered medications for this visit.   BP 124/70 mmHg  Pulse 69  Ht 5' 3.5" (1.613 m)  Wt 165 lb (74.844 kg)  BMI 28.77 kg/m2 General: NAD Neck: No JVD, no thyromegaly or thyroid nodule.  Lungs: Clear to auscultation bilaterally with normal respiratory effort. CV: Nondisplaced PMI.  Heart regular S1/S2, no S3/S4, no murmur.  No peripheral edema.  No carotid bruit.  Normal pedal pulses.  Abdomen: Soft, nontender, no hepatosplenomegaly, no distention.  Skin: Intact without lesions or rashes.  Neurologic: Alert and oriented x 3.  Psych: Normal affect. Extremities: No clubbing or cyanosis.   Assessment/Plan: Atrial fibrillation: Paroxysmal.  EF was normal on 7/13 echo. Maintaining NSR on flecainide and tolerating metoprolol without significant bradycardia.  She had mild disease only on left heart cath in 2013.   - Continue flecainide and metoprolol.  - Continue apixaban.  Recent BMET/CBC ok.    - Followup 6 months with PA, 1 year with me.      Loralie Champagne 07/23/2015

## 2015-08-06 ENCOUNTER — Other Ambulatory Visit: Payer: Self-pay | Admitting: Cardiology

## 2015-08-14 ENCOUNTER — Encounter: Payer: Self-pay | Admitting: Internal Medicine

## 2015-08-18 ENCOUNTER — Other Ambulatory Visit: Payer: Self-pay | Admitting: Cardiology

## 2015-08-21 ENCOUNTER — Other Ambulatory Visit: Payer: Self-pay | Admitting: Cardiology

## 2015-08-21 ENCOUNTER — Telehealth: Payer: Self-pay | Admitting: Family Medicine

## 2015-08-21 NOTE — Telephone Encounter (Signed)
It looks like this was filled by another provider 08/19/15, would not refill under Dr Aundra Dubin.

## 2015-08-21 NOTE — Telephone Encounter (Signed)
This was filled on 08/19/15

## 2015-08-21 NOTE — Telephone Encounter (Signed)
Pt was prescribed ambien by dr Saintclair Halsted. Pt would like new rx generic ambien 5 mg #30 [facility]

## 2015-08-22 ENCOUNTER — Other Ambulatory Visit (HOSPITAL_COMMUNITY): Payer: Self-pay | Admitting: *Deleted

## 2015-08-24 ENCOUNTER — Other Ambulatory Visit: Payer: Self-pay | Admitting: Cardiology

## 2015-08-24 NOTE — Telephone Encounter (Signed)
Pt calling requesting a refill on ambien 5 mg. Pt would like a call back at [phone]. Please advise

## 2015-08-24 NOTE — Telephone Encounter (Signed)
Per pt dr Aundra Dubin nurse said md is cardiologist and does not prescribe ambien. Pt would like to know if dr hunter will give her rx

## 2015-08-25 ENCOUNTER — Ambulatory Visit (INDEPENDENT_AMBULATORY_CARE_PROVIDER_SITE_OTHER): Payer: Medicare Other | Admitting: Family Medicine

## 2015-08-25 ENCOUNTER — Encounter: Payer: Self-pay | Admitting: Family Medicine

## 2015-08-25 VITALS — BP 124/70 | HR 68 | Temp 98.1°F | Wt 162.0 lb

## 2015-08-25 DIAGNOSIS — G4700 Insomnia, unspecified: Secondary | ICD-10-CM

## 2015-08-25 MED ORDER — ZOLPIDEM TARTRATE 5 MG PO TABS: 5.0000 mg | ORAL_TABLET | Freq: Every evening | ORAL | Status: DC | PRN

## 2015-08-25 NOTE — Telephone Encounter (Signed)
Ms. Bridget Lane please get pt scheduled to discuss Ambien per Dr. Yong Channel.

## 2015-08-25 NOTE — Progress Notes (Signed)
Garret Reddish, MD  Subjective:  Bridget Lane is a 81 y.o. year old very pleasant female patient who presents for/with See problem oriented charting ROS- No SI/HI. No hallucinations or vivid dreams.  Past Medical History-  Patient Active Problem List   Diagnosis Date Noted  . Atrial fibrillation 06/14/2011    Priority: High  . Insomnia 08/25/2015    Priority: Medium  . Spinal stenosis of cervical region 05/29/2015    Priority: Medium  . Hyperglycemia 06/07/2014    Priority: Medium  . Hyperlipidemia 01/28/2008    Priority: Medium  . History of Helicobacter pylori infection 07/06/2014    Priority: Low  . OSA (obstructive sleep apnea) 02/11/2012    Priority: Low  . Long term (current) use of anticoagulants 07/19/2011    Priority: Low  . Chest pain 06/14/2011    Priority: Low  . Depression 01/30/2010    Priority: Low  . History of colonic polyps 01/30/2010    Priority: Low  . ESOPHAGEAL STRICTURE 07/20/2008    Priority: Low  . Osteoarthritis 07/20/2008    Priority: Low  . Multiple thyroid nodules 01/28/2008    Priority: Low  . GERD 01/28/2008    Priority: Low    Medications- reviewed and updated Current Outpatient Prescriptions  Medication Sig Dispense Refill  . docusate sodium (COLACE) 100 MG capsule Take 100 mg by mouth daily as needed for mild constipation.    Marland Kitchen ELIQUIS 5 MG TABS tablet TAKE 1 TABLET BY MOUTH TWICE A DAY 60 tablet 5  . flecainide (TAMBOCOR) 100 MG tablet TAKE 1 TABLET (100 MG TOTAL) BY MOUTH 2 (TWO) TIMES DAILY. 180 tablet 0  . Menthol, Topical Analgesic, (ASPERCREME HEAT EX) Apply 1 application topically daily as needed.    . Menthol, Topical Analgesic, (BIOFREEZE EX) Apply 1 application topically daily as needed.    . metoprolol tartrate (LOPRESSOR) 25 MG tablet TAKE 1/2 TABLET TWICE DAILY 90 tablet 1  . Multiple Vitamin (MULTIVITAMIN) tablet Take 1 tablet by mouth daily.    Marland Kitchen omeprazole (PRILOSEC) 40 MG capsule Take 1 capsule (40 mg total) by  mouth daily. 30 capsule 6  . Probiotic Product (PROBIOTIC DAILY PO) Take 1 capsule by mouth daily.    Marland Kitchen zolpidem (AMBIEN) 5 MG tablet Take 1 tablet (5 mg total) by mouth at bedtime as needed. for sleep 30 tablet 2   No current facility-administered medications for this visit.    Objective: BP 124/70 mmHg  Pulse 68  Temp(Src) 98.1 F (36.7 C)  Wt 162 lb (73.483 kg) Gen: NAD, resting comfortably Psych: tearful about prior incident, anxious appearing at times  Assessment/Plan:  Insomnia S: operated on august 8th. Since that time has had a very difficult time getting to sleep. May sleep for 2 hours and then cannot sleep for 2 hours. Suffered for 2 years with pain. Went to Dr. Saintclair Halsted and melatonin was not helping- was only getting 2-3 hours and hurting all night. Did not take for 2-3 nights then took it when he got in the bed and did not get to sleep until about 3 hours. Did not take for a few more nights. Within an hour or an hour and a half would fall asleep when took it later and slept for about 5-6 hours. Felt rested, did not feel hungover like she did when she took tylenol-pm. No side effects. Dreams actually were better than when she was taking pain pills. Not reading or watching tv in bed but reads or watches tv  in room. Does use screens within an hour of bedtime. Has not had ambien in 4 nights, only getting 4-5 hours.   Explains story of fentanyl patch rash and potential shingles. Withdrawal from fentanyl. Finally got better after 5 days. Laid on floor for 5 days. soonafter found to be in a fib. She is very concerned about narcotic withdrawal being listed on problem list so much so that she does not want to be a patient anywhere. Perseverates on idea of being considered a drug seeker.  A/P: Extensive counseling from 3:40 PM to 4:23 PM provided both in regards to sleep hygiene as well as risks of ambien as well as trying to help patient process/deal with prior hurts. Plan was to trial trazodone  but could prolong qt interval with flecainide so we opted against. Ambien with no interactions with other medications and tolerated well so #30 with 2 refills provided. She is using it sparingly and seems to help without side effects. Strict return precautions advised.     Return precautions advised.   Meds ordered this encounter  Medications  . zolpidem (AMBIEN) 5 MG tablet    Sig: Take 1 tablet (5 mg total) by mouth at bedtime as needed. for sleep    Dispense:  30 tablet    Refill:  2    Not to exceed 5 additional fills before 01/17/2016   >50% of 43  minute office visit was spent on counseling and coordination of care- see above

## 2015-08-25 NOTE — Telephone Encounter (Signed)
I have never addressed this issue with patient to my knowledge. There are definitely risks with ambien and there are safer alternatives. I would advise patient to schedule a visit to discuss with me within a month

## 2015-08-25 NOTE — Patient Instructions (Signed)
Ambien increases your risk of falls and confusion especially at night time. If any of this happens- please let me know immediately. i would also wait at least 10 hours between taking medicine and driving and would not drive if you feel tired or woozy  Other things we discussed for sleep include: 1. No screen time an hour before bed 2. Use bedroom for sleep only (avoid reading or tv time) 3. If you are not tired- get up and go to another room and read then only go back to bed when tired

## 2015-08-25 NOTE — Telephone Encounter (Signed)
See below

## 2015-08-25 NOTE — Telephone Encounter (Signed)
Pt has been sch today

## 2015-08-25 NOTE — Assessment & Plan Note (Addendum)
S: operated on august 8th. Since that time has had a very difficult time getting to sleep. May sleep for 2 hours and then cannot sleep for 2 hours. Suffered for 2 years with pain. Went to Dr. Saintclair Halsted and melatonin was not helping- was only getting 2-3 hours and hurting all night. Did not take for 2-3 nights then took it when he got in the bed and did not get to sleep until about 3 hours. Did not take for a few more nights. Within an hour or an hour and a half would fall asleep when took it later and slept for about 5-6 hours. Felt rested, did not feel hungover like she did when she took tylenol-pm. No side effects. Dreams actually were better than when she was taking pain pills. Not reading or watching tv in bed but reads or watches tv in room. Does use screens within an hour of bedtime. Has not had ambien in 4 nights, only getting 4-5 hours.   Explains story of fentanyl patch rash and potential shingles. Withdrawal from fentanyl. Finally got better after 5 days. Laid on floor for 5 days. soonafter found to be in a fib. She is very concerned about narcotic withdrawal being listed on problem list so much so that she does not want to be a patient anywhere. Perseverates on idea of being considered a drug seeker. She feels the doctor forced the medicine on her and did not explain side effects well.   A/P: Extensive counseling from 3:40 PM to 4:23 PM provided both in regards to sleep hygiene as well as risks of ambien as well as trying to help patient process/deal with prior hurts. Plan was to trial trazodone but could prolong qt interval with flecainide so we opted against. Ambien with no interactions with other medications and tolerated well so #30 with 2 refills provided. She is using it sparingly and seems to help without side effects. Strict return precautions advised. We did remove narcotic withdrawal from list.

## 2015-08-29 ENCOUNTER — Ambulatory Visit: Payer: Medicare Other | Admitting: Gastroenterology

## 2015-08-30 ENCOUNTER — Ambulatory Visit (INDEPENDENT_AMBULATORY_CARE_PROVIDER_SITE_OTHER): Payer: Medicare Other | Admitting: Gastroenterology

## 2015-08-30 ENCOUNTER — Other Ambulatory Visit: Payer: Self-pay | Admitting: Neurosurgery

## 2015-08-30 ENCOUNTER — Encounter: Payer: Self-pay | Admitting: Gastroenterology

## 2015-08-30 VITALS — BP 110/60 | HR 80 | Ht 63.5 in | Wt 161.6 lb

## 2015-08-30 DIAGNOSIS — R1013 Epigastric pain: Secondary | ICD-10-CM

## 2015-08-30 DIAGNOSIS — Z86010 Personal history of colonic polyps: Secondary | ICD-10-CM | POA: Diagnosis not present

## 2015-08-30 DIAGNOSIS — Z800 Family history of malignant neoplasm of digestive organs: Secondary | ICD-10-CM | POA: Diagnosis not present

## 2015-08-30 DIAGNOSIS — M5481 Occipital neuralgia: Secondary | ICD-10-CM

## 2015-08-30 NOTE — Progress Notes (Signed)
Bridget Lane    [id number]    1932/11/18  Primary Care Physician:Stephen Yong Channel, MD  Referring Physician: [name], MD [place], [place] [address]  Chief complaint:  Dyspepsia IBS  HPI: 81 year old female who is former patient of Dr. Nichola Sizer with complaints of dyspepsia, she is taking ranitidine with some improvement. She had colonoscopy in October 2015 5 polyps were removed, 3 tubular adenomas. Denies constipation or diarrhea. No nausea, vomiting, has intermittent epigastric pain. She has a strong family history of cancer, her father had colon cancer in her early 45s, aunt had breast cancer and her sister had breast cancer in their 21s.  Outpatient Encounter Prescriptions as of 08/30/2015  Medication Sig  . docusate sodium (COLACE) 100 MG capsule Take 100 mg by mouth daily as needed for mild constipation.  Marland Kitchen ELIQUIS 5 MG TABS tablet TAKE 1 TABLET BY MOUTH TWICE A DAY  . flecainide (TAMBOCOR) 100 MG tablet TAKE 1 TABLET (100 MG TOTAL) BY MOUTH 2 (TWO) TIMES DAILY.  Marland Kitchen Menthol, Topical Analgesic, (ASPERCREME HEAT EX) Apply 1 application topically daily as needed.  . metoprolol tartrate (LOPRESSOR) 25 MG tablet TAKE 1/2 TABLET TWICE DAILY  . Multiple Vitamin (MULTIVITAMIN) tablet Take 1 tablet by mouth daily.  Marland Kitchen omeprazole (PRILOSEC) 40 MG capsule Take 1 capsule (40 mg total) by mouth daily.  . Probiotic Product (PROBIOTIC DAILY PO) Take 1 capsule by mouth daily.  Marland Kitchen zolpidem (AMBIEN) 5 MG tablet Take 1 tablet (5 mg total) by mouth at bedtime as needed. for sleep  . [DISCONTINUED] Menthol, Topical Analgesic, (BIOFREEZE EX) Apply 1 application topically daily as needed.   No facility-administered encounter medications on file as of 08/30/2015.    Allergies as of 08/30/2015 - Review Complete 08/30/2015  Allergen Reaction Noted  . Penicillins Rash 05/25/2009  . Codeine Other (See Comments) 05/25/2009  . Acetaminophen Other (See Comments)  05/25/2009  . Dabigatran etexilate mesylate Other (See Comments) 02/11/2012  . Sulfonamide derivatives Rash 05/25/2009    Past Medical History  Diagnosis Date  . OA (osteoarthritis)   . DJD (degenerative joint disease)   . Macular degeneration   . Depression   . Colon polyp 2005  . Esophageal stricture   . Hyperlipidemia   . H. pylori infection   . GERD (gastroesophageal reflux disease)   . Nontoxic multinodular goiter   . Hiatal hernia   . Atrial fibrillation ([place])     persistent  . Postherpetic neuralgia at T3-T5 level 04/27/2011  . Rectal bleeding 07/06/2014    Hemorrhoid related in past.    . Dysrhythmia     Past Surgical History  Procedure Laterality Date  . Appendectomy    . Tubal ligation      with appendectomy  . Cholecystectomy  1989  . Total abdominal hysterectomy  1972    for pain (no BSO)  . Esophageal dilation      > 3 X; Dr Olevia Perches  . Colonoscopy w/ polypectomy  2005    Neg in 2010; Dr Olevia Perches  . Cardioversion  08/29/2011    Procedure: CARDIOVERSION;  Surgeon: Bing Quarry, MD;  Location: [facility];  Service: Cardiovascular;  Laterality: N/A;  . Cardioversion  11/22/2011    Procedure: CARDIOVERSION;  Surgeon: Coralyn Mark, MD;  Location: [facility];  Service: Cardiovascular;  Laterality: N/A;  . Upper gi endoscopy  2010    H pylori  . Left and right heart  catheterization with coronary angiogram N/A 12/02/2011    Procedure: LEFT AND RIGHT HEART CATHETERIZATION WITH CORONARY ANGIOGRAM;  Surgeon: Jolaine Artist, MD;  Location: [facility] CATH LAB;  Service: Cardiovascular;  Laterality: N/A;  . Cardiac catheterization    . Anterior cervical decomp/discectomy fusion N/A 05/29/2015    Procedure: ANTERIOR CERVICAL DECOMPRESSION/DISCECTOMY FUSION CERVICAL FOUR-FIVE,CERVICAL FIVE-SIX;  Surgeon: Kary Kos, MD;  Location: [facility] NEURO ORS;  Service: Neurosurgery;  Laterality: N/A;    Family History  Problem Relation Age of Onset  . Colon cancer Father     in 96s  . Osteoarthritis  Father   . Coronary artery disease Father   . Heart failure Mother   . Coronary artery disease Mother   . Diabetes Mother   . Breast cancer Sister   . Diabetes Sister   . Stroke Sister   . Coronary artery disease Brother   . Throat cancer Brother     smoked  . Breast cancer Maternal Aunt   . Hypothyroidism Sister     Social History   Social History  . Marital Status: Married    Spouse Name: N/A  . Number of Children: N/A  . Years of Education: N/A   Occupational History  . Not on file.   Social History Main Topics  . Smoking status: Never Smoker   . Smokeless tobacco: Never Used  . Alcohol Use: No  . Drug Use: No  . Sexual Activity: Not on file   Other Topics Concern  . Not on file   Social History Narrative   Married 1956. 4 children 2 boys 2 girls. 16 grandkids.  5 greatgrandkids.    Pt lives in [place] with spouse.        Retired from [employer]      Hobbies: travel, spend time with people, family time   Exercise-walking      No HCPOA-advised to do this.       Review of systems: Review of Systems  Constitutional: Negative for fever and chills.  HENT: Negative.   Eyes: Negative for blurred vision.  Respiratory: Negative for cough, shortness of breath and wheezing.   Cardiovascular: Negative for chest pain and palpitations.  Gastrointestinal: as per HPI Genitourinary: Negative for dysuria, urgency, frequency and hematuria.  Musculoskeletal: Negative for myalgias, back pain and joint pain.  Skin: Negative for itching and rash.  Neurological: Negative for dizziness, tremors, focal weakness, seizures and loss of consciousness.  Endo/Heme/Allergies: Negative for environmental allergies.  Psychiatric/Behavioral: Negative for depression, suicidal ideas and hallucinations.  All other systems reviewed and are negative.   Physical Exam: Filed Vitals:   08/30/15 1354  BP: 110/60  Pulse: 80   Gen:      No acute distress HEENT:   EOMI, sclera anicteric Neck:     No masses; no thyromegaly Lungs:    Clear to auscultation bilaterally; normal respiratory effort CV:         Regular rate and rhythm; no murmurs Abd:      + bowel sounds; soft, non-tender; no palpable masses, no distension Ext:    No edema; adequate peripheral perfusion Skin:      Warm and dry; no rash Neuro: alert and oriented x 3 Psych: normal mood and affect  Data Reviewed:  Colonoscopy 2015 Five sessile polyps were found at the cecum x2 and in the ascending colon x3 ; polypectomy was performed with cold forceps x1 in ascending colon polyp and polypectomy was performed with a cold snare x 4,  Size of the polyp from 6-9 mm small internal nonbleeding hemorrhoids 1. Surgical [P], ascending, polyp (3) - TUBULAR ADENOMA, THREE FRAGMENTS. - BENIGN COLORECTAL MUCOSA, TWO FRAGMENTS. - NO HIGH GRADE DYSPLASIA OR MALIGNANCY IDENTIFIED. 2. Surgical [P], cecum, polyp (2) - FRAGMENTS OF TUBULAR ADENOMA. - NO HIGH GRADE DYSPLASIA OR MALIGNANCY IDENTIFIED  Assessment and Plan/Recommendations: 81 year old female with history of irritable bowel syndrome here with complaints of dyspepsia  PPI daily Discussed antireflux measures  recall colonoscopy in 2018 Also encouraged patient to see genetic counselor to assess family risk especially for her children and grandchildren Return in 1 year  K. Denzil Magnuson , MD [phone] Mon-Fri 8a-5p [phone] after 5p, weekends, holidays

## 2015-09-04 ENCOUNTER — Telehealth: Payer: Self-pay | Admitting: Cardiology

## 2015-09-04 ENCOUNTER — Telehealth: Payer: Self-pay | Admitting: Genetic Counselor

## 2015-09-04 NOTE — Telephone Encounter (Signed)
SPOKE PT AND SHE WOULD LIKE TO CALL BACK AT A LATER DATE TO SCHEDULE AN APPT

## 2015-09-04 NOTE — Telephone Encounter (Signed)
New Message    Office calling stating they faxed over clearance for pt to stop Eliquis 2 days prior to injection and they haven't heard back. Please call back and advise.

## 2015-09-04 NOTE — Telephone Encounter (Signed)
LMTCB-I have fax request to hold Eliquis, Dr Aundra Dubin in office 09/05/15 to complete form.

## 2015-09-04 NOTE — Telephone Encounter (Signed)
Chrys Racer advised fax request to hold Eliquis here for Dr Aundra Dubin to complete 09/05/15

## 2015-09-05 NOTE — Telephone Encounter (Signed)
Dr Aundra Dubin has completed request to hold Eliquis. Dr Aundra Dubin has said Ok to hold Eliquis for 2 days. I have given from completed by Dr Aundra Dubin to medical records to be faxed.

## 2015-09-06 ENCOUNTER — Other Ambulatory Visit: Payer: Self-pay | Admitting: Gastroenterology

## 2015-09-06 NOTE — Telephone Encounter (Signed)
Found clearance in medical records and Maudie Mercury from MR sent fax on through. Called [place] at [facility] and let her know that it was just sent. Chrys Racer voiced understanding and was appreciative for call back.

## 2015-09-06 NOTE — Telephone Encounter (Signed)
Follow Up   Chrys Racer with [facility] imaging is calling back to to follow up. Have not received the fax for clearance yet.

## 2015-09-08 ENCOUNTER — Ambulatory Visit
Admission: RE | Admit: 2015-09-08 | Discharge: 2015-09-08 | Disposition: A | Discharge status: Home / Self Care | Payer: Medicare Other | Source: Ambulatory Visit | Attending: Neurosurgery | Admitting: Neurosurgery

## 2015-09-08 DIAGNOSIS — M5481 Occipital neuralgia: Secondary | ICD-10-CM

## 2015-09-08 MED ORDER — DEXAMETHASONE SODIUM PHOSPHATE 4 MG/ML IJ SOLN
4.0000 mg | Freq: Once | INTRAMUSCULAR | Status: AC
  Administered 2015-09-08: 4 mg

## 2015-09-08 NOTE — Discharge Instructions (Signed)

## 2015-09-18 ENCOUNTER — Telehealth: Payer: Self-pay | Admitting: Cardiology

## 2015-09-18 NOTE — Telephone Encounter (Signed)
Pt states she had neck surgery August 8, pt continues to have neck pain/ tension in her neck.  Pt advised to contact surgeon or PCP for recommendations for pain medication/muscle relaxer.  Pt verbalized understanding.

## 2015-09-18 NOTE — Telephone Encounter (Signed)
New message     Patient calling could Dr. Aundra Dubin prescribe her something - muscle relaxer c/o  pain in neck where she had surgery.

## 2015-10-05 ENCOUNTER — Other Ambulatory Visit: Payer: Self-pay | Admitting: Cardiology

## 2015-10-09 ENCOUNTER — Telehealth: Payer: Self-pay | Admitting: *Deleted

## 2015-10-09 ENCOUNTER — Telehealth: Payer: Self-pay | Admitting: Cardiology

## 2015-10-09 NOTE — Telephone Encounter (Signed)
LVM that sample are up front. 1 box of Eliquis 5 mg.

## 2015-10-09 NOTE — Telephone Encounter (Signed)
New message      Patient calling the office for samples of medication:   1.  What medication and dosage are you requesting samples for? eliquis 5mg   2.  Are you currently out of this medication? Yes   *STAT* If patient is at the pharmacy, call can be transferred to refill team.   1. Which medications need to be refilled? (please list name of each medication and dose if known) eliquis 5mg , metoprolol 25mg , flecainide 100mg  2. Which pharmacy/location (including street and city if local pharmacy) is medication to be sent to? [facility]--fax [phone] pharmacy 3. Do they need a 30 day or 90 day supply? 90 day and 3 refills

## 2015-10-10 MED ORDER — METOPROLOL TARTRATE 25 MG PO TABS: 12.5000 mg | ORAL_TABLET | Freq: Two times a day (BID) | ORAL | Status: DC

## 2015-10-10 NOTE — Telephone Encounter (Signed)
Called pt to inform her that we did not have any samples of Eliquis 5 mg tablet and that the pt could call back tomorrow to see if we have gotten any samples in at that time. I also informed the pt that I had sent a refill to her pharmacy as requested. I advised the pt that if she has any other problems, questions or concerns to call the office. Pt verbalized understanding.

## 2015-10-10 NOTE — Telephone Encounter (Signed)
Follow up     Patient calling the office for samples of medication:   1.  What medication and dosage are you requesting samples for?  eliquis 5mg   2.  Are you currently out of this medication? No--pt just picked up samples but realized that she does not have enough for the end of the year.  Need 2 more weeks of samples please    *STAT* If patient is at the pharmacy, call can be transferred to refill team.   1. Which medications need to be refilled? (please list name of each medication and dose if known) metoprolol  25mg  2. Which pharmacy/location (including street and city if local pharmacy) is medication to be sent to?    [facility]  3. Do they need a 30 day or 90 day supply? 30 day supply

## 2015-10-11 ENCOUNTER — Telehealth: Payer: Self-pay

## 2015-10-11 MED ORDER — METOPROLOL TARTRATE 25 MG PO TABS: 12.5000 mg | ORAL_TABLET | Freq: Two times a day (BID) | ORAL | Status: DC

## 2015-10-11 MED ORDER — FLECAINIDE ACETATE 100 MG PO TABS: ORAL_TABLET | ORAL | Status: DC

## 2015-10-11 MED ORDER — APIXABAN 5 MG PO TABS: 5.0000 mg | ORAL_TABLET | Freq: Two times a day (BID) | ORAL | Status: DC

## 2015-10-11 NOTE — Addendum Note (Signed)
Addended by: Roberts Gaudy on: 10/11/2015 01:39 PM   Modules accepted: Orders

## 2015-10-11 NOTE — Telephone Encounter (Signed)
New message       *STAT* If patient is at the pharmacy, call can be transferred to refill team.   1. Which medications need to be refilled? (please list name of each medication and dose if known) eliquis 5mg , metoprolol 25mg , flecainide 100mg  2. Which pharmacy/location (including street and city if local pharmacy) is medication to be sent to? [facility]  Fax [phone] 3. Do they need a 30 day or 90 day supply? 90 day supply with 3 refills Pt requested refills to be sent to [facility] on 12-19.  As of today, pharmacy has not received refills

## 2015-10-11 NOTE — Telephone Encounter (Signed)
called and cancelled all active refills on Eliquis, Metoprolol & flecainide per pt request, will send to [facility] as request..

## 2015-10-17 ENCOUNTER — Telehealth: Payer: Self-pay | Admitting: Cardiology

## 2015-10-17 NOTE — Telephone Encounter (Signed)
New problem   Pt calling concerning the fax that was suppose to be sent to [facility] for  her prescriptions. Pt stated they never received them. Please call pt.

## 2015-10-18 ENCOUNTER — Telehealth: Payer: Self-pay | Admitting: *Deleted

## 2015-10-18 MED ORDER — FLECAINIDE ACETATE 100 MG PO TABS: ORAL_TABLET | ORAL | Status: DC

## 2015-10-18 MED ORDER — METOPROLOL TARTRATE 25 MG PO TABS: 12.5000 mg | ORAL_TABLET | Freq: Two times a day (BID) | ORAL | Status: DC

## 2015-10-18 MED ORDER — APIXABAN 5 MG PO TABS: 5.0000 mg | ORAL_TABLET | Freq: Two times a day (BID) | ORAL | Status: DC

## 2015-10-18 NOTE — Telephone Encounter (Signed)
See prior, this a extension of that message, Bethann Berkshire verified the [place], [place]. As the correctplace to send the Escrib, they will call the pt to let her know they have them.

## 2015-10-18 NOTE — Telephone Encounter (Signed)
calling about RX's that was sent to [facility], will call them to verify. will let pt know outcome. samples of eliquis 5 mg placed up front as well.   Called [facility] and spoke with [place], we have to send all E[facility] Rx's to [facility], so we had to give verbal scripts, will call and let the pt know. Verified by Bethann Berkshire,

## 2015-12-12 DIAGNOSIS — H2512 Age-related nuclear cataract, left eye: Secondary | ICD-10-CM | POA: Diagnosis not present

## 2015-12-12 DIAGNOSIS — H25012 Cortical age-related cataract, left eye: Secondary | ICD-10-CM | POA: Diagnosis not present

## 2015-12-13 DIAGNOSIS — H2511 Age-related nuclear cataract, right eye: Secondary | ICD-10-CM | POA: Diagnosis not present

## 2015-12-19 DIAGNOSIS — H25011 Cortical age-related cataract, right eye: Secondary | ICD-10-CM | POA: Diagnosis not present

## 2015-12-19 DIAGNOSIS — H2511 Age-related nuclear cataract, right eye: Secondary | ICD-10-CM | POA: Diagnosis not present

## 2016-01-23 ENCOUNTER — Telehealth: Payer: Self-pay | Admitting: Family Medicine

## 2016-01-23 MED ORDER — ZOLPIDEM TARTRATE 5 MG PO TABS: 5.0000 mg | ORAL_TABLET | Freq: Every evening | ORAL | Status: DC | PRN

## 2016-01-23 NOTE — Telephone Encounter (Signed)
Yes thanks 

## 2016-01-23 NOTE — Telephone Encounter (Signed)
Refill ok? 

## 2016-01-23 NOTE — Telephone Encounter (Signed)
Pt request refill  zolpidem (AMBIEN) 5 MG tablet  [facility]/ [address]  Pt wants Dr Yong Channel to know she does not take on a regular basis, but helps when she has neck pain form surgery last year.

## 2016-01-24 NOTE — Telephone Encounter (Signed)
Medication refilled

## 2016-03-01 DIAGNOSIS — Z08 Encounter for follow-up examination after completed treatment for malignant neoplasm: Secondary | ICD-10-CM | POA: Diagnosis not present

## 2016-03-01 DIAGNOSIS — X32XXXD Exposure to sunlight, subsequent encounter: Secondary | ICD-10-CM | POA: Diagnosis not present

## 2016-03-01 DIAGNOSIS — L820 Inflamed seborrheic keratosis: Secondary | ICD-10-CM | POA: Diagnosis not present

## 2016-03-01 DIAGNOSIS — Z85828 Personal history of other malignant neoplasm of skin: Secondary | ICD-10-CM | POA: Diagnosis not present

## 2016-03-01 DIAGNOSIS — D225 Melanocytic nevi of trunk: Secondary | ICD-10-CM | POA: Diagnosis not present

## 2016-03-01 DIAGNOSIS — L570 Actinic keratosis: Secondary | ICD-10-CM | POA: Diagnosis not present

## 2016-03-26 ENCOUNTER — Other Ambulatory Visit: Payer: Self-pay | Admitting: Gastroenterology

## 2016-03-27 ENCOUNTER — Ambulatory Visit (INDEPENDENT_AMBULATORY_CARE_PROVIDER_SITE_OTHER): Payer: Medicare Other | Admitting: Family Medicine

## 2016-03-27 ENCOUNTER — Encounter: Payer: Self-pay | Admitting: Family Medicine

## 2016-03-27 VITALS — BP 132/68 | HR 50 | Temp 97.5°F | Ht 63.5 in | Wt 166.0 lb

## 2016-03-27 DIAGNOSIS — R229 Localized swelling, mass and lump, unspecified: Secondary | ICD-10-CM

## 2016-03-27 NOTE — Patient Instructions (Signed)
Labs before you leave  We will call you within a week about your referral for ultrasound of the neck. If you do not hear within 2 weeks, give [place] a call.

## 2016-03-27 NOTE — Progress Notes (Signed)
Subjective:  Bridget Lane is a 82 y.o. year old very pleasant female patient who presents for/with See problem oriented charting ROS- see any ROS included in HPI as well.   Past Medical History-  Patient Active Problem List   Diagnosis Date Noted  . Atrial fibrillation 06/14/2011    Priority: High  . Insomnia 08/25/2015    Priority: Medium  . Spinal stenosis of cervical region 05/29/2015    Priority: Medium  . Hyperglycemia 06/07/2014    Priority: Medium  . Hyperlipidemia 01/28/2008    Priority: Medium  . History of Helicobacter pylori infection 07/06/2014    Priority: Low  . OSA (obstructive sleep apnea) 02/11/2012    Priority: Low  . Long term (current) use of anticoagulants 07/19/2011    Priority: Low  . Chest pain 06/14/2011    Priority: Low  . Depression 01/30/2010    Priority: Low  . History of colonic polyps 01/30/2010    Priority: Low  . ESOPHAGEAL STRICTURE 07/20/2008    Priority: Low  . Osteoarthritis 07/20/2008    Priority: Low  . Multiple thyroid nodules 01/28/2008    Priority: Low  . GERD 01/28/2008    Priority: Low    Medications- reviewed and updated Current Outpatient Prescriptions  Medication Sig Dispense Refill  . apixaban (ELIQUIS) 5 MG TABS tablet Take 1 tablet (5 mg total) by mouth 2 (two) times daily. 60 tablet 5  . docusate sodium (COLACE) 100 MG capsule Take 100 mg by mouth daily as needed for mild constipation.    . flecainide (TAMBOCOR) 100 MG tablet TAKE 1 TABLET (100 MG TOTAL) BY MOUTH 2 (TWO) TIMES DAILY. 180 tablet 2  . Menthol, Topical Analgesic, (ASPERCREME HEAT EX) Apply 1 application topically daily as needed.    . metoprolol tartrate (LOPRESSOR) 25 MG tablet Take 0.5 tablets (12.5 mg total) by mouth 2 (two) times daily. 30 tablet 9  . Multiple Vitamin (MULTIVITAMIN) tablet Take 1 tablet by mouth daily.    Marland Kitchen omeprazole (PRILOSEC) 40 MG capsule TAKE 1 CAPSULE BY MOUTH EVERY DAY 30 capsule 0  . Probiotic Product (PROBIOTIC DAILY PO)  Take 1 capsule by mouth daily.    Marland Kitchen zolpidem (AMBIEN) 5 MG tablet Take 1 tablet (5 mg total) by mouth at bedtime as needed. for sleep 30 tablet 5   No current facility-administered medications for this visit.    Objective: BP 132/68 mmHg  Pulse 50  Temp(Src) 97.5 F (36.4 C) (Oral)  Ht 5' 3.5" (1.613 m)  Wt 166 lb (75.297 kg)  BMI 28.94 kg/m2  SpO2 97% Gen: NAD, resting comfortably No thyromegaly 2x 2 cm subcutaneous enlargement raised at least a cm. Freely mobile. Rubbery substance just below angle of jaw by 1-2 cm . No lymphadenopathy waist up otherwise CV: RRR no murmurs rubs or gallops Lungs: CTAB no crackles, wheeze, rhonchi Abdomen: soft/nontender/nondistended/normal bowel sounds. No rebound or guarding.  Ext: no edema Skin: warm, dry Neuro: grossly normal, moves all extremities  Assessment/Plan:  Knot on neck S: Present for 2-3 months. Mild soreness. Has not changed in size. 2 course of antibiotics around eye surgery to make sure not infectious cause through optho but no help. No itch. Sees [facility] but has not seen them for this. About 2 cm in size round, mobile. Nothing makes it better or worse ROS- no fever, chills, nausea, fatigue, unintentional weight loss, night sweats. No redness. No new medications recently A/P: I suspect this is lymphadenopathy but ? lipoma. Given 2-3 months  without improvement lymphoma on differential certainly is lymphoma. We will get an ultrasound- ? Lipoma. Depending on findings- likely to refer back to ENT for consideration of biopsy or excisional biopsy.  Also get labs.   Return precautions advised.   Orders Placed This Encounter  Procedures  . US Soft Tissue Head/Neck    Standing Status: Future     Number of Occurrences:      Standing Expiration Date: 05/27/2017    Scheduling Instructions:     2x 2 cm subcutaneous enlargement raised at least a cm. Freely mobile. Rubbery substance just below angle of jaw by 1-2 cm. Lymphadenopathy? Lipoma?  Present for 2 months    Order Specific Question:  Reason for Exam (SYMPTOM  OR DIAGNOSIS REQUIRED)    Answer:  subcutaneous nodule on neck    Order Specific Question:  Preferred imaging location?    Answer:  [place] [place]  . CBC with Differential/Platelet  . Comprehensive metabolic panel    [place]  new acute issue, moderate risk-uncertain prognosis.   Garret Reddish, MD

## 2016-03-27 NOTE — Progress Notes (Signed)
Pre visit review using our clinic review tool, if applicable. No additional management support is needed unless otherwise documented below in the visit note. 

## 2016-04-04 ENCOUNTER — Ambulatory Visit
Admission: RE | Admit: 2016-04-04 | Discharge: 2016-04-04 | Disposition: A | Discharge status: Home / Self Care | Payer: Medicare Other | Source: Ambulatory Visit | Attending: Family Medicine | Admitting: Family Medicine

## 2016-04-04 ENCOUNTER — Telehealth: Payer: Self-pay | Admitting: Family Medicine

## 2016-04-04 DIAGNOSIS — E041 Nontoxic single thyroid nodule: Secondary | ICD-10-CM | POA: Diagnosis not present

## 2016-04-04 DIAGNOSIS — R229 Localized swelling, mass and lump, unspecified: Secondary | ICD-10-CM

## 2016-04-04 DIAGNOSIS — R591 Generalized enlarged lymph nodes: Secondary | ICD-10-CM

## 2016-04-04 NOTE — Telephone Encounter (Signed)
Reviewed ultrasound report and told patient needed further imaging given abnormal central tissue in lymph node.   Ordered this stat with hope this can be done within the next week. She will be awaiting call.   FYI for scheduling Bridget Lane in general- Roselyn Reef to keep you in the loop

## 2016-04-08 NOTE — Telephone Encounter (Signed)
Perfect thanks Butch Penny!

## 2016-04-08 NOTE — Telephone Encounter (Signed)
Bridget Lane, when is pt having CT done?

## 2016-04-08 NOTE — Telephone Encounter (Addendum)
June 26,2017@12 :30 on appointments

## 2016-04-08 NOTE — Telephone Encounter (Signed)
Dr.Huner, pt is scheduled see message from [place].

## 2016-04-15 ENCOUNTER — Other Ambulatory Visit: Payer: Medicare Other

## 2016-04-16 ENCOUNTER — Telehealth: Payer: Self-pay | Admitting: Family Medicine

## 2016-04-16 ENCOUNTER — Other Ambulatory Visit (INDEPENDENT_AMBULATORY_CARE_PROVIDER_SITE_OTHER): Payer: Medicare Other

## 2016-04-16 DIAGNOSIS — I10 Essential (primary) hypertension: Secondary | ICD-10-CM | POA: Diagnosis not present

## 2016-04-16 DIAGNOSIS — D649 Anemia, unspecified: Secondary | ICD-10-CM | POA: Diagnosis not present

## 2016-04-16 LAB — CBC WITH DIFFERENTIAL/PLATELET
BASOS PCT: 0.5 % (ref 0.0–3.0)
Basophils Absolute: 0 10*3/uL (ref 0.0–0.1)
EOS PCT: 1.4 % (ref 0.0–5.0)
Eosinophils Absolute: 0.1 10*3/uL (ref 0.0–0.7)
HCT: 36.9 % (ref 36.0–46.0)
HEMOGLOBIN: 12.3 g/dL (ref 12.0–15.0)
LYMPHS ABS: 2 10*3/uL (ref 0.7–4.0)
Lymphocytes Relative: 32.3 % (ref 12.0–46.0)
MCHC: 33.2 g/dL (ref 30.0–36.0)
MCV: 84.8 fl (ref 78.0–100.0)
MONO ABS: 0.4 10*3/uL (ref 0.1–1.0)
MONOS PCT: 6.9 % (ref 3.0–12.0)
Neutro Abs: 3.6 10*3/uL (ref 1.4–7.7)
Neutrophils Relative %: 58.9 % (ref 43.0–77.0)
Platelets: 280 10*3/uL (ref 150.0–400.0)
RBC: 4.35 Mil/uL (ref 3.87–5.11)
RDW: 14.4 % (ref 11.5–15.5)
WBC: 6.2 10*3/uL (ref 4.0–10.5)

## 2016-04-16 LAB — COMPREHENSIVE METABOLIC PANEL
ALBUMIN: 4 g/dL (ref 3.5–5.2)
ALK PHOS: 77 U/L (ref 39–117)
ALT: 13 U/L (ref 0–35)
AST: 17 U/L (ref 0–37)
BUN: 15 mg/dL (ref 6–23)
CHLORIDE: 107 meq/L (ref 96–112)
CO2: 26 mEq/L (ref 19–32)
Calcium: 9.2 mg/dL (ref 8.4–10.5)
Creatinine, Ser: 0.95 mg/dL (ref 0.40–1.20)
GFR: 60.01 mL/min (ref >60.00)
Glucose, Bld: 100 mg/dL — ABNORMAL HIGH (ref 70–99)
POTASSIUM: 4 meq/L (ref 3.5–5.1)
SODIUM: 140 meq/L (ref 135–145)
TOTAL PROTEIN: 6.6 g/dL (ref 6.0–8.3)
Total Bilirubin: 0.6 mg/dL (ref 0.2–1.2)

## 2016-04-16 NOTE — Telephone Encounter (Signed)
Wells Guiles with [facility] imaging would like results of pt's lab today. Advised pt md has not viewed and cannot release results until Dr Yong Channel looks at them. Wells Guiles needs the labs faxed today before 5pm because pt has procedure in the am at 8:30 am. They cannot proceed until they have the labs.  Fax: [phone]

## 2016-04-17 ENCOUNTER — Other Ambulatory Visit: Payer: Self-pay

## 2016-04-17 ENCOUNTER — Ambulatory Visit
Admission: RE | Admit: 2016-04-17 | Discharge: 2016-04-17 | Disposition: A | Discharge status: Home / Self Care | Payer: Medicare Other | Source: Ambulatory Visit | Attending: Family Medicine | Admitting: Family Medicine

## 2016-04-17 DIAGNOSIS — R591 Generalized enlarged lymph nodes: Secondary | ICD-10-CM

## 2016-04-17 DIAGNOSIS — R590 Localized enlarged lymph nodes: Secondary | ICD-10-CM | POA: Diagnosis not present

## 2016-04-17 MED ORDER — IOPAMIDOL (ISOVUE-300) INJECTION 61%
75.0000 mL | Freq: Once | INTRAVENOUS | Status: AC | PRN
  Administered 2016-04-17: 75 mL via INTRAVENOUS

## 2016-04-17 NOTE — Telephone Encounter (Signed)
Faxed over labs this morning after Dr. Yong Channel reviewed them. Attn: Wells Guiles

## 2016-04-23 ENCOUNTER — Other Ambulatory Visit: Payer: Self-pay | Admitting: Gastroenterology

## 2016-05-14 DIAGNOSIS — R590 Localized enlarged lymph nodes: Secondary | ICD-10-CM | POA: Diagnosis not present

## 2016-05-16 ENCOUNTER — Other Ambulatory Visit: Payer: Self-pay | Admitting: Cardiology

## 2016-05-16 DIAGNOSIS — R590 Localized enlarged lymph nodes: Secondary | ICD-10-CM | POA: Insufficient documentation

## 2016-05-29 ENCOUNTER — Other Ambulatory Visit: Payer: Self-pay | Admitting: Gastroenterology

## 2016-07-04 ENCOUNTER — Other Ambulatory Visit: Payer: Self-pay | Admitting: Gastroenterology

## 2016-07-29 ENCOUNTER — Other Ambulatory Visit: Payer: Self-pay | Admitting: Cardiology

## 2016-07-29 ENCOUNTER — Other Ambulatory Visit: Payer: Self-pay | Admitting: *Deleted

## 2016-07-29 MED ORDER — FLECAINIDE ACETATE 100 MG PO TABS: ORAL_TABLET | ORAL | 0 refills | Status: DC

## 2016-08-06 ENCOUNTER — Other Ambulatory Visit: Payer: Self-pay | Admitting: Gastroenterology

## 2016-08-14 ENCOUNTER — Other Ambulatory Visit: Payer: Self-pay | Admitting: Cardiology

## 2016-08-31 ENCOUNTER — Encounter: Payer: Self-pay | Admitting: Family Medicine

## 2016-09-03 ENCOUNTER — Ambulatory Visit (INDEPENDENT_AMBULATORY_CARE_PROVIDER_SITE_OTHER): Payer: Medicare Other | Admitting: Family Medicine

## 2016-09-03 ENCOUNTER — Encounter: Payer: Self-pay | Admitting: Family Medicine

## 2016-09-03 VITALS — BP 128/72 | HR 53 | Temp 98.1°F | Wt 167.2 lb

## 2016-09-03 DIAGNOSIS — R202 Paresthesia of skin: Secondary | ICD-10-CM

## 2016-09-03 DIAGNOSIS — N644 Mastodynia: Secondary | ICD-10-CM

## 2016-09-03 DIAGNOSIS — R200 Anesthesia of skin: Secondary | ICD-10-CM

## 2016-09-03 NOTE — Progress Notes (Signed)
Subjective:  Bridget Lane is a 82 y.o. year old very pleasant female patient who presents for/with See problem oriented charting ROS- no arm weakness. No dropping objects. No fever, chills. No rash over breast.see any ROS included in HPI as well.   Past Medical History-  Patient Active Problem List   Diagnosis Date Noted  . Atrial fibrillation 06/14/2011    Priority: High  . Insomnia 08/25/2015    Priority: Medium  . Spinal stenosis of cervical region 05/29/2015    Priority: Medium  . Hyperglycemia 06/07/2014    Priority: Medium  . Hyperlipidemia 01/28/2008    Priority: Medium  . History of Helicobacter pylori infection 07/06/2014    Priority: Low  . OSA (obstructive sleep apnea) 02/11/2012    Priority: Low  . Long term (current) use of anticoagulants 07/19/2011    Priority: Low  . Chest pain 06/14/2011    Priority: Low  . Depression 01/30/2010    Priority: Low  . History of colonic polyps 01/30/2010    Priority: Low  . ESOPHAGEAL STRICTURE 07/20/2008    Priority: Low  . Osteoarthritis 07/20/2008    Priority: Low  . Multiple thyroid nodules 01/28/2008    Priority: Low  . GERD 01/28/2008    Priority: Low    Medications- reviewed and updated Current Outpatient Prescriptions  Medication Sig Dispense Refill  . docusate sodium (COLACE) 100 MG capsule Take 100 mg by mouth daily as needed for mild constipation.    Marland Kitchen ELIQUIS 5 MG TABS tablet TAKE 1 TABLET BY MOUTH TWICE DAILY 60 tablet 1  . flecainide (TAMBOCOR) 100 MG tablet TAKE 1 TABLET (100 MG TOTAL) BY MOUTH 2 (TWO) TIMES DAILY. 60 tablet 0  . metoprolol tartrate (LOPRESSOR) 25 MG tablet Take 0.5 tablets (12.5 mg total) by mouth 2 (two) times daily. 30 tablet 9  . Multiple Vitamin (MULTIVITAMIN) tablet Take 1 tablet by mouth daily.    Marland Kitchen omeprazole (PRILOSEC) 40 MG capsule TAKE 1 CAPSULE BY MOUTH EVERY DAY 30 capsule 0   No current facility-administered medications for this visit.     Objective: BP 128/72 (BP  Location: Left Arm, Patient Position: Sitting, Cuff Size: Normal)   Pulse (!) 53   Temp 98.1 F (36.7 C) (Oral)   Wt 167 lb 3.2 oz (75.8 kg)   SpO2 97%   BMI 29.15 kg/m  Gen: NAD, resting comfortably CV: RRR no murmurs rubs or gallops Right Breast: normal appearance, no masses. Tenderness noted from 6 to 9 PM on palpation of breat Lungs: CTAB no crackles, wheeze, rhonchi Abdomen: soft/nontender/nondistended/normal bowel sounds. overweight  Ext: no edema Skin: warm, dry, no rash over breast, no nipple discharge  Assessment/Plan:  Breast pain, right -  S: Left breast pain for 2 months particularly with palpation but hurts at other times- lateral lower portion of breast. Describes mild aching, worse with palpation. No treatments tried. Last breast imaging was 02/13/2009 A/P: breast pain of unclear etiology- evaluate maligancy/mass though none on exam- Plan: [facility] Digital [facility] Unilat R, US BREAST LTD UNI RIGHT INC AXILLA   Right arm numbness S: Wakes up every morning with numbness/tingling in right arm elbow down. Elbow feels puffy at times. History of surgery for cervical stenosis. 1.5 months of symptoms. Gets better when moving around. States not laying on it. Only lasts short amount of time in AM.  A/P: Patient's main concern was if this was related to heart- considering only happens when she wakes up, resolves with moving arm, no exertional  symptoms- likelihood of cardiac low. couldbe coming from sleep position including the arm itself or the neck- she declines further workup . Doubt numbness related to breast pain.   Advised to keep upcoming CPE, come fasting  Orders Placed This Encounter  Procedures  . MM Digital Diagnostic Unilat R    Standing Status:   Future    Standing Expiration Date:   11/03/2017    Order Specific Question:   Reason for Exam (SYMPTOM  OR DIAGNOSIS REQUIRED)    Answer:   right breast pain 6-9 PM    Order Specific Question:   Preferred imaging location?     Answer:   [facility]  . US BREAST LTD UNI RIGHT INC AXILLA    Standing Status:   Future    Standing Expiration Date:   11/03/2017    Order Specific Question:   Reason for Exam (SYMPTOM  OR DIAGNOSIS REQUIRED)    Answer:   right breast pain 9 from 6-9 pm    Order Specific Question:   Preferred imaging location?    Answer:   [facility]   Return precautions advised.  Garret Reddish, MD

## 2016-09-03 NOTE — Progress Notes (Signed)
Pre visit review using our clinic review tool, if applicable. No additional management support is needed unless otherwise documented below in the visit note. 

## 2016-09-03 NOTE — Patient Instructions (Addendum)
The right arm numbness tingling could be from the neck or sleep position but very low chance related to your heart  We will call you within a week about your referral for mammogram and ultrasound of right breast. If you do not hear within 2 weeks, give [place] a call.   I would schedule a physical with me within 6 months. Come fasting  Before then- I would also like for you to sign up for an annual wellness visit with our nurse, Manuela Schwartz, who specializes in the annual wellness exam. This is a free benefit under medicare that may help [place] find additional ways to help you. Some highlights are reviewing medications, lifestyle, and doing a dementia screen.

## 2016-09-04 ENCOUNTER — Ambulatory Visit: Payer: Medicare Other | Admitting: Family Medicine

## 2016-09-11 ENCOUNTER — Ambulatory Visit (INDEPENDENT_AMBULATORY_CARE_PROVIDER_SITE_OTHER): Payer: Medicare Other | Admitting: Cardiology

## 2016-09-11 ENCOUNTER — Other Ambulatory Visit: Payer: Self-pay | Admitting: Gastroenterology

## 2016-09-11 VITALS — BP 132/82 | HR 52 | Ht 63.5 in | Wt 166.0 lb

## 2016-09-11 DIAGNOSIS — I480 Paroxysmal atrial fibrillation: Secondary | ICD-10-CM

## 2016-09-11 NOTE — Patient Instructions (Signed)
Medication Instructions:  Your physician recommends that you continue on your current medications as directed. Please refer to the Current Medication list given to you today.   Labwork: None   Testing/Procedures: None   Follow-Up: Your physician wants you to follow-up in: 1 year with Dr End (November 2018).  You will receive a reminder letter in the mail two months in advance. If you don't receive a letter, please call our office to schedule the follow-up appointment.        If you need a refill on your cardiac medications before your next appointment, please call your pharmacy.

## 2016-09-13 ENCOUNTER — Encounter: Payer: Self-pay | Admitting: Cardiology

## 2016-09-13 NOTE — Progress Notes (Signed)
Patient ID: Bridget Lane, female   DOB: 03-22-1933, 82 y.o.   MRN: [id number] PCP: Dr. Yong Channel  82 yo with history of paroxysmal atrial fibrillation presents for followup. She is on apixaban and flecainide.  No recent palpitations.  She has been taking low dose metoprolol with no problems (prior history of bradycardia).  Today, she is in NSR.  Mild dyspnea with fast walking.  No exertional chest pain. No rectal bleeding or melena on Eliquis.    ECG: NSR, normal  Labs (4/14): Flecainide level normal, K 4.1, creatinine 0.8 Labs (10/14): LFTs normal, TSH normal, HCT 38.8 Labs (3/15): K 4.4, creatinine 1.1, HCT 37.4 Labs (8/15): LDL 143, HDL 47 Labs (8/16): HCT 39.7, K 4.8, creatinine 1.08 Labs (6/17): K 4, creatinine 0.95, hgb 12.3  PMH: 1. BPPV 2. OA 3. GERD 4. Hyperlipidemia 5. Paroxysmal atrial fibrillation 6. Multinodular goiter 7. Cholecystectomy 8. Appendectomy 9. Echo (7/13) with EF 55-60% 10. LHC/RHC (7/13) with mild nonobstructive CAD, LAD bridging; mean RA 4, PA 28/7, mean PCWP 7. 11. Esophageal dysmotility.  12. 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   SH: Married, nonsmoker, lives in [place]  FH: No premature CAD  Current Outpatient Prescriptions  Medication Sig Dispense Refill  . docusate sodium (COLACE) 100 MG capsule Take 100 mg by mouth daily as needed for mild constipation.    Marland Kitchen ELIQUIS 5 MG TABS tablet TAKE 1 TABLET BY MOUTH TWICE DAILY 60 tablet 1  . flecainide (TAMBOCOR) 100 MG tablet TAKE 1 TABLET (100 MG TOTAL) BY MOUTH 2 (TWO) TIMES DAILY. 60 tablet 0  . metoprolol tartrate (LOPRESSOR) 25 MG tablet Take 0.5 tablets (12.5 mg total) by mouth 2 (two) times daily. 30 tablet 9  . Multiple Vitamin (MULTIVITAMIN) tablet Take 1 tablet by mouth daily.    Marland Kitchen omeprazole (PRILOSEC) 40 MG capsule TAKE 1 CAPSULE BY MOUTH EVERY DAY 30 capsule 0  . tiZANidine (ZANAFLEX) 4 MG tablet Take 4 mg by mouth 2 (two) times daily as needed.  0   No current facility-administered  medications for this visit.    BP 132/82   Pulse (!) 52   Ht 5' 3.5" (1.613 m)   Wt 166 lb (75.3 kg)   BMI 28.94 kg/m  General: NAD Neck: No JVD, no thyromegaly or thyroid nodule.  Lungs: Clear to auscultation bilaterally with normal respiratory effort. CV: Nondisplaced PMI.  Heart regular S1/S2, no S3/S4, no murmur.  No peripheral edema.  No carotid bruit.  Normal pedal pulses.  Abdomen: Soft, nontender, no hepatosplenomegaly, no distention.  Skin: Intact without lesions or rashes.  Neurologic: Alert and oriented x 3.  Psych: Normal affect. Extremities: No clubbing or cyanosis.   Assessment/Plan: Atrial fibrillation: Paroxysmal.  EF was normal on 7/13 echo. Maintaining NSR on flecainide and tolerating metoprolol without significant bradycardia.  She had mild disease only on left heart cath in 2013.   - Continue flecainide and metoprolol.  - Continue apixaban.  Will get CBC and BMET in 2 wks with her physical at PCP's office.    - Followup 1 year with Dr End given my transition to CHF clinic.       Loralie Champagne 09/13/2016

## 2016-09-16 ENCOUNTER — Ambulatory Visit
Admission: RE | Admit: 2016-09-16 | Discharge: 2016-09-16 | Disposition: A | Discharge status: Home / Self Care | Payer: Medicare Other | Source: Ambulatory Visit | Attending: Family Medicine | Admitting: Family Medicine

## 2016-09-16 DIAGNOSIS — N644 Mastodynia: Secondary | ICD-10-CM

## 2016-09-17 ENCOUNTER — Other Ambulatory Visit: Payer: Self-pay | Admitting: Family Medicine

## 2016-09-26 ENCOUNTER — Telehealth: Payer: Self-pay

## 2016-09-26 ENCOUNTER — Telehealth: Payer: Self-pay | Admitting: Family Medicine

## 2016-09-26 NOTE — Telephone Encounter (Signed)
Form already faxed back to pharmacy with approval.

## 2016-09-26 NOTE — Telephone Encounter (Signed)
Bridget Lane is calling PA for zolpidem has been approve from 09-26-16 to 09-26-17

## 2016-09-26 NOTE — Telephone Encounter (Signed)
PA approved, form faxed back to pharmacy. 

## 2016-09-26 NOTE — Telephone Encounter (Signed)
Received PA request from [facility] for Ambien 5 mg tablets. PA submitted & is pending. Key:  Bridget Lane

## 2016-10-01 ENCOUNTER — Other Ambulatory Visit: Payer: Self-pay | Admitting: Cardiology

## 2016-10-01 MED ORDER — METOPROLOL TARTRATE 25 MG PO TABS: 12.5000 mg | ORAL_TABLET | Freq: Two times a day (BID) | ORAL | 11 refills | Status: DC

## 2016-10-10 ENCOUNTER — Other Ambulatory Visit: Payer: Self-pay | Admitting: Gastroenterology

## 2016-10-10 ENCOUNTER — Other Ambulatory Visit: Payer: Self-pay | Admitting: Cardiology

## 2016-10-29 ENCOUNTER — Other Ambulatory Visit: Payer: Medicare Other

## 2016-11-01 ENCOUNTER — Other Ambulatory Visit (INDEPENDENT_AMBULATORY_CARE_PROVIDER_SITE_OTHER): Payer: Medicare Other

## 2016-11-01 DIAGNOSIS — E785 Hyperlipidemia, unspecified: Secondary | ICD-10-CM | POA: Diagnosis not present

## 2016-11-01 DIAGNOSIS — R739 Hyperglycemia, unspecified: Secondary | ICD-10-CM | POA: Diagnosis not present

## 2016-11-01 LAB — CBC WITH DIFFERENTIAL/PLATELET
BASOS ABS: 0 10*3/uL (ref 0.0–0.1)
Basophils Relative: 0.8 % (ref 0.0–3.0)
EOS ABS: 0.1 10*3/uL (ref 0.0–0.7)
Eosinophils Relative: 2.2 % (ref 0.0–5.0)
HCT: 38.8 % (ref 36.0–46.0)
Hemoglobin: 13 g/dL (ref 12.0–15.0)
LYMPHS ABS: 2.3 10*3/uL (ref 0.7–4.0)
Lymphocytes Relative: 39.7 % (ref 12.0–46.0)
MCHC: 33.4 g/dL (ref 30.0–36.0)
MCV: 85.8 fl (ref 78.0–100.0)
Monocytes Absolute: 0.5 10*3/uL (ref 0.1–1.0)
Monocytes Relative: 8 % (ref 3.0–12.0)
NEUTROS ABS: 2.9 10*3/uL (ref 1.4–7.7)
NEUTROS PCT: 49.3 % (ref 43.0–77.0)
PLATELETS: 291 10*3/uL (ref 150.0–400.0)
RBC: 4.52 Mil/uL (ref 3.87–5.11)
RDW: 14.6 % (ref 11.5–15.5)
WBC: 5.9 10*3/uL (ref 4.0–10.5)

## 2016-11-01 LAB — BASIC METABOLIC PANEL
BUN: 16 mg/dL (ref 6–23)
CALCIUM: 9.8 mg/dL (ref 8.4–10.5)
CO2: 31 meq/L (ref 19–32)
CREATININE: 0.95 mg/dL (ref 0.40–1.20)
Chloride: 104 mEq/L (ref 96–112)
GFR: 59.93 mL/min — ABNORMAL LOW (ref >60.00)
GLUCOSE: 86 mg/dL (ref 70–99)
Potassium: 4.3 mEq/L (ref 3.5–5.1)
Sodium: 142 mEq/L (ref 135–145)

## 2016-11-01 LAB — LIPID PANEL
CHOL/HDL RATIO: 4
Cholesterol: 214 mg/dL — ABNORMAL HIGH (ref 0–200)
HDL: 53.5 mg/dL (ref >39.00)
LDL Cholesterol: 127 mg/dL — ABNORMAL HIGH (ref 0–99)
NonHDL: 160.19
TRIGLYCERIDES: 164 mg/dL — AB (ref 0.0–149.0)
VLDL: 32.8 mg/dL (ref 0.0–40.0)

## 2016-11-01 LAB — HEPATIC FUNCTION PANEL
ALT: 14 U/L (ref 0–35)
AST: 18 U/L (ref 0–37)
Albumin: 4 g/dL (ref 3.5–5.2)
Alkaline Phosphatase: 69 U/L (ref 39–117)
BILIRUBIN DIRECT: 0.1 mg/dL (ref 0.0–0.3)
BILIRUBIN TOTAL: 0.8 mg/dL (ref 0.2–1.2)
TOTAL PROTEIN: 6.6 g/dL (ref 6.0–8.3)

## 2016-11-01 LAB — HEMOGLOBIN A1C: Hgb A1c MFr Bld: 5.8 % (ref 4.6–6.5)

## 2016-11-05 ENCOUNTER — Ambulatory Visit (INDEPENDENT_AMBULATORY_CARE_PROVIDER_SITE_OTHER): Payer: Medicare Other | Admitting: Family Medicine

## 2016-11-05 ENCOUNTER — Encounter: Payer: Self-pay | Admitting: Family Medicine

## 2016-11-05 VITALS — BP 132/62 | HR 56 | Temp 98.0°F | Ht 63.5 in | Wt 168.4 lb

## 2016-11-05 DIAGNOSIS — R590 Localized enlarged lymph nodes: Secondary | ICD-10-CM

## 2016-11-05 DIAGNOSIS — R739 Hyperglycemia, unspecified: Secondary | ICD-10-CM | POA: Diagnosis not present

## 2016-11-05 DIAGNOSIS — E785 Hyperlipidemia, unspecified: Secondary | ICD-10-CM

## 2016-11-05 DIAGNOSIS — I480 Paroxysmal atrial fibrillation: Secondary | ICD-10-CM | POA: Diagnosis not present

## 2016-11-05 DIAGNOSIS — Z23 Encounter for immunization: Secondary | ICD-10-CM | POA: Diagnosis not present

## 2016-11-05 DIAGNOSIS — Z0000 Encounter for general adult medical examination without abnormal findings: Secondary | ICD-10-CM

## 2016-11-05 DIAGNOSIS — M4802 Spinal stenosis, cervical region: Secondary | ICD-10-CM

## 2016-11-05 MED ORDER — PREDNISONE 20 MG PO TABS: ORAL_TABLET | ORAL | 0 refills | Status: DC

## 2016-11-05 NOTE — Assessment & Plan Note (Signed)
3 months ago numbness R arm almost every night and throbbing pain when she wakes up- eventually able to shake this out but not improving since last visit. Trial short course of prednsione and follow up with Dr. Saintclair Halsted if not improving

## 2016-11-05 NOTE — Assessment & Plan Note (Signed)
I do not feel this on exam today. Had ent evaluation and no further follow up planned

## 2016-11-05 NOTE — Progress Notes (Signed)
Pre visit review using our clinic review tool, if applicable. No additional management support is needed unless otherwise documented below in the visit note. 

## 2016-11-05 NOTE — Progress Notes (Signed)
Phone: [phone]  Subjective:  Patient presents today for their annual physical. Chief complaint-noted.   See problem oriented charting- ROS- full  review of systems was completed and negative except for: numbness and pain in right arm as below  The following were reviewed and entered/updated in epic: Past Medical History:  Diagnosis Date  . Atrial fibrillation ([place])    persistent  . Colon polyp 2005  . Depression   . DJD (degenerative joint disease)   . Dysrhythmia   . Esophageal stricture   . GERD (gastroesophageal reflux disease)   . H. pylori infection   . Hiatal hernia   . Hyperlipidemia   . Macular degeneration   . Nontoxic multinodular goiter   . OA (osteoarthritis)   . Postherpetic neuralgia at T3-T5 level 04/27/2011  . Rectal bleeding 07/06/2014   Hemorrhoid related in past.     Patient Active Problem List   Diagnosis Date Noted  . Atrial fibrillation 06/14/2011    Priority: High  . Insomnia 08/25/2015    Priority: Medium  . Spinal stenosis in cervical region 05/29/2015    Priority: Medium  . Hyperglycemia 06/07/2014    Priority: Medium  . Hyperlipidemia 01/28/2008    Priority: Medium  . Cervical lymphadenopathy 05/16/2016    Priority: Low  . History of Helicobacter pylori infection 07/06/2014    Priority: Low  . OSA (obstructive sleep apnea) 02/11/2012    Priority: Low  . Long term (current) use of anticoagulants 07/19/2011    Priority: Low  . Chest pain 06/14/2011    Priority: Low  . Depression 01/30/2010    Priority: Low  . History of colonic polyps 01/30/2010    Priority: Low  . ESOPHAGEAL STRICTURE 07/20/2008    Priority: Low  . Osteoarthritis 07/20/2008    Priority: Low  . Multiple thyroid nodules 01/28/2008    Priority: Low  . GERD 01/28/2008    Priority: Low   Past Surgical History:  Procedure Laterality Date  . ANTERIOR CERVICAL DECOMP/DISCECTOMY FUSION N/A 05/29/2015   Procedure: ANTERIOR CERVICAL DECOMPRESSION/DISCECTOMY FUSION  CERVICAL FOUR-FIVE,CERVICAL FIVE-SIX;  Surgeon: Kary Kos, MD;  Location: [facility];  Service: Neurosurgery;  Laterality: N/A;  . APPENDECTOMY    . CARDIAC CATHETERIZATION    . CARDIOVERSION  08/29/2011   Procedure: CARDIOVERSION;  Surgeon: Bing Quarry, MD;  Location: [facility];  Service: Cardiovascular;  Laterality: N/A;  . CARDIOVERSION  11/22/2011   Procedure: CARDIOVERSION;  Surgeon: Coralyn Mark, MD;  Location: [facility];  Service: Cardiovascular;  Laterality: N/A;  . CHOLECYSTECTOMY  1989  . COLONOSCOPY W/ POLYPECTOMY  2005   Neg in 2010; Dr Olevia Perches  . ESOPHAGEAL DILATION     > 3 X; Dr Olevia Perches  . LEFT AND RIGHT HEART CATHETERIZATION WITH CORONARY ANGIOGRAM N/A 12/02/2011   Procedure: LEFT AND RIGHT HEART CATHETERIZATION WITH CORONARY ANGIOGRAM;  Surgeon: Jolaine Artist, MD;  [facility];  Service: Cardiovascular;  Laterality: N/A;  . TOTAL ABDOMINAL HYSTERECTOMY  1972   for pain (no BSO)  . TUBAL LIGATION     with appendectomy  . UPPER GI ENDOSCOPY  2010   H pylori    Family History  Problem Relation Age of Onset  . Colon cancer Father     in 65s  . Osteoarthritis Father   . Coronary artery disease Father   . Heart failure Mother   . Coronary artery disease Mother   . Diabetes Mother   . Breast cancer Sister   . Diabetes Sister   .  Stroke Sister   . Coronary artery disease Brother   . Throat cancer Brother     smoked  . Breast cancer Maternal Aunt   . Hypothyroidism Sister     Medications- reviewed and updated Current Outpatient Prescriptions  Medication Sig Dispense Refill  . docusate sodium (COLACE) 100 MG capsule Take 100 mg by mouth daily as needed for mild constipation.    Marland Kitchen ELIQUIS 5 MG TABS tablet TAKE 1 TABLET BY MOUTH TWICE DAILY 60 tablet 1  . flecainide (TAMBOCOR) 100 MG tablet TAKE 1 TABLET(100 MG) BY MOUTH TWICE DAILY 180 tablet 3  . metoprolol tartrate (LOPRESSOR) 25 MG tablet Take 0.5 tablets (12.5 mg total) by mouth 2 (two) times daily. 30  tablet 11  . Multiple Vitamin (MULTIVITAMIN) tablet Take 1 tablet by mouth daily.    Marland Kitchen omeprazole (PRILOSEC) 40 MG capsule TAKE 1 CAPSULE BY MOUTH EVERY DAY 30 capsule 0  . tiZANidine (ZANAFLEX) 4 MG tablet Take 4 mg by mouth 2 (two) times daily as needed.  0  . zolpidem (AMBIEN) 5 MG tablet TAKE 1 TABLET BY MOUTH AT BEDTIME AS NEEDED FOR SLEEP 30 tablet 5  . predniSONE (DELTASONE) 20 MG tablet Take 2 pills for 3 days, 1 pill for 4 days 10 tablet 0   No current facility-administered medications for this visit.     Allergies-reviewed and updated Allergies  Allergen Reactions  . Penicillins Rash  . Codeine Other (See Comments)    hallucinations  . Acetaminophen Other (See Comments)    hurts stomach  . Dabigatran Etexilate Mesylate Other (See Comments)     All extremities feel heavy and hurt  . Sulfa Antibiotics Rash  . Sulfonamide Derivatives Rash    Social History   Social History  . Marital status: Married    Spouse name: N/A  . Number of children: N/A  . Years of education: N/A   Social History Main Topics  . Smoking status: Never Smoker  . Smokeless tobacco: Never Used  . Alcohol use No  . Drug use: No  . Sexual activity: Not on file   Other Topics Concern  . Not on file   Social History Narrative   Married 1956. 4 children 2 boys 2 girls. 16 grandkids.  5 greatgrandkids.    Pt lives in [place] with spouse.        Retired from [employer]      Hobbies: travel, spend time with people, family time   Exercise-walking      No HCPOA-advised to do this.     Objective: BP 132/62 (BP Location: Left Arm, Patient Position: Sitting, Cuff Size: Normal)   Pulse (!) 56   Temp 98 F (36.7 C) (Oral)   Ht 5' 3.5" (1.613 m)   Wt 168 lb 6.4 oz (76.4 kg)   SpO2 96%   BMI 29.36 kg/m  Gen: NAD, resting comfortably HEENT: Mucous membranes are moist. Oropharynx normal Neck: no thyromegaly, no lymphadenopathy that was prior felt CV: RRR but  bradycardic but  No rubs or gallops Lungs: CTAB no crackles, wheeze, rhonchi Abdomen: soft/nontender/nondistended/normal bowel sounds. overweight  Ext: no edema Skin: warm, dry Neuro: grossly normal, moves all extremities, PERRLA  Declines breast exam- done recently and done with GYN as well  Assessment/Plan:  83 y.o. female presenting for annual physical.  Health Maintenance counseling: 1. Anticipatory guidance: Patient counseled regarding regular dental exams, eye exams, wearing seatbelts.  2. Risk factor reduction:  Advised patient of need for regular  exercise and diet rich and fruits and vegetables to reduce risk of heart attack and stroke. Walking 3x a week. Not eating as well as she should- states a lot of candy and poor eating over holidays- going to turnt this around for blood sugars and cholesterol both being up 3. Immunizations/screenings/ancillary studies Immunization History  Administered Date(s) Administered  . Influenza Whole 08/21/2012  . Influenza, High Dose Seasonal PF 08/21/2013  . Influenza-Unspecified 08/04/2014, 07/22/2015, 07/05/2016  . Pneumococcal Conjugate-13 09/30/2014  . Pneumococcal Polysaccharide-23 11/05/2016  . Tdap 07/02/2011  . Zoster 09/08/2013   Health Maintenance Due  Topic Date Due  . DEXA SCAN - declines for now 05/01/2000  . PNA vac Low Risk Adult (2 of 2 - PPSV23)- today 10/01/2015   4. Cervical cancer screening- Sees gynecology but no recent pelvic but no symptoms- no exam planned 5. Breast cancer screening-  breast exam today with gynecology and mammogram - done diagnostically in November for pain- this was normal and 1 year follow up planned 6. Colon cancer screening - passed age based screening. Polyp 2015 but appears no recall due to age unless rectal bleeding 7. Skin cancer screening- sees dermatology regularly  Status of chronic or acute concerns   GERD- treated through GI on prilosec 40mg   Atrial fibrillation S: paroxysmal-  appears in sinus rhythm today. Compliant with Eliquis, flecainide, metoprolol 12.5mg  BID. Follows with cardiology A/P: continue current medications- doing very well   Hyperglycemia Lab Results  Component Value Date   HGBA1C 5.8 11/01/2016  mild elevations- has been eating poorly- counseled on healthier diet  Spinal stenosis in cervical region 3 months ago numbness R arm almost every night and throbbing pain when she wakes up- eventually able to shake this out but not improving since last visit. Trial short course of prednsione and follow up with Dr. Saintclair Halsted if not improving  Hyperlipidemia No indication for primary prevention in age group. Advised dietary modifications  Cervical lymphadenopathy I do not feel this on exam today. Had ent evaluation and no further follow up planned  1 year CPE, awv with susan in 6 months  Orders Placed This Encounter  Procedures  . Pneumococcal polysaccharide vaccine 23-valent greater than or equal to 2yo subcutaneous/IM    Meds ordered this encounter  Medications  . predniSONE (DELTASONE) 20 MG tablet    Sig: Take 2 pills for 3 days, 1 pill for 4 days    Dispense:  10 tablet    Refill:  0    Return precautions advised.   Garret Reddish, MD

## 2016-11-05 NOTE — Assessment & Plan Note (Signed)
Lab Results  Component Value Date   HGBA1C 5.8 11/01/2016  mild elevations- has been eating poorly- counseled on healthier diet

## 2016-11-05 NOTE — Assessment & Plan Note (Signed)
No indication for primary prevention in age group. Advised dietary modifications

## 2016-11-05 NOTE — Assessment & Plan Note (Signed)
S: paroxysmal- appears in sinus rhythm today. Compliant with Eliquis, flecainide, metoprolol 12.5mg  BID. Follows with cardiology A/P: continue current medications- doing very well

## 2016-11-05 NOTE — Patient Instructions (Addendum)
You declined bone density for now- let us know if you change your mind  Final pneumonia shot (pneumovax 23 today)  Trial prednisone for 7 days. Follow up with Dr. Saintclair Halsted if right arm not improving  Improve the diet- cut down on the candies, veggies and water should be your best friends!   I would also like for you to sign up for an annual wellness visit with our nurse, Manuela Schwartz, who specializes in the annual wellness exam in 6 months. This is a free benefit under medicare that may help [place] find additional ways to help you. Some highlights are reviewing medications, lifestyle, and doing a dementia screen.

## 2016-11-07 ENCOUNTER — Other Ambulatory Visit: Payer: Self-pay | Admitting: Gastroenterology

## 2016-11-11 ENCOUNTER — Other Ambulatory Visit: Payer: Self-pay | Admitting: Cardiology

## 2016-12-04 DIAGNOSIS — H3530 Unspecified macular degeneration: Secondary | ICD-10-CM | POA: Diagnosis not present

## 2016-12-04 DIAGNOSIS — Z961 Presence of intraocular lens: Secondary | ICD-10-CM | POA: Diagnosis not present

## 2016-12-04 DIAGNOSIS — H524 Presbyopia: Secondary | ICD-10-CM | POA: Diagnosis not present

## 2016-12-04 DIAGNOSIS — H5211 Myopia, right eye: Secondary | ICD-10-CM | POA: Diagnosis not present

## 2016-12-11 ENCOUNTER — Other Ambulatory Visit: Payer: Self-pay | Admitting: Gastroenterology

## 2016-12-12 DIAGNOSIS — H353132 Nonexudative age-related macular degeneration, bilateral, intermediate dry stage: Secondary | ICD-10-CM | POA: Diagnosis not present

## 2016-12-12 DIAGNOSIS — H43813 Vitreous degeneration, bilateral: Secondary | ICD-10-CM | POA: Diagnosis not present

## 2017-01-10 ENCOUNTER — Other Ambulatory Visit: Payer: Self-pay | Admitting: Gastroenterology

## 2017-01-27 ENCOUNTER — Encounter: Payer: Self-pay | Admitting: Family Medicine

## 2017-01-27 ENCOUNTER — Telehealth: Payer: Self-pay | Admitting: Family Medicine

## 2017-01-27 ENCOUNTER — Ambulatory Visit (INDEPENDENT_AMBULATORY_CARE_PROVIDER_SITE_OTHER): Payer: Medicare Other | Admitting: Family Medicine

## 2017-01-27 VITALS — BP 152/72 | HR 58 | Temp 97.8°F | Ht 63.5 in | Wt 171.8 lb

## 2017-01-27 DIAGNOSIS — I499 Cardiac arrhythmia, unspecified: Secondary | ICD-10-CM | POA: Diagnosis not present

## 2017-01-27 DIAGNOSIS — R5383 Other fatigue: Secondary | ICD-10-CM

## 2017-01-27 LAB — CBC
HCT: 36.4 % (ref 36.0–46.0)
HEMOGLOBIN: 12.2 g/dL (ref 12.0–15.0)
MCHC: 33.5 g/dL (ref 30.0–36.0)
MCV: 85.2 fl (ref 78.0–100.0)
Platelets: 289 10*3/uL (ref 150.0–400.0)
RBC: 4.28 Mil/uL (ref 3.87–5.11)
RDW: 14.7 % (ref 11.5–15.5)
WBC: 6.8 10*3/uL (ref 4.0–10.5)

## 2017-01-27 LAB — COMPREHENSIVE METABOLIC PANEL
ALK PHOS: 69 U/L (ref 39–117)
ALT: 15 U/L (ref 0–35)
AST: 16 U/L (ref 0–37)
Albumin: 3.9 g/dL (ref 3.5–5.2)
BILIRUBIN TOTAL: 0.8 mg/dL (ref 0.2–1.2)
BUN: 19 mg/dL (ref 6–23)
CO2: 29 meq/L (ref 19–32)
CREATININE: 0.94 mg/dL (ref 0.40–1.20)
Calcium: 9.4 mg/dL (ref 8.4–10.5)
Chloride: 106 mEq/L (ref 96–112)
GFR: 60.63 mL/min (ref >60.00)
GLUCOSE: 87 mg/dL (ref 70–99)
Potassium: 4.1 mEq/L (ref 3.5–5.1)
SODIUM: 140 meq/L (ref 135–145)
TOTAL PROTEIN: 6.3 g/dL (ref 6.0–8.3)

## 2017-01-27 LAB — TSH: TSH: 0.57 u[IU]/mL (ref 0.35–4.50)

## 2017-01-27 NOTE — Patient Instructions (Signed)
Please stop by lab before you go  You declined x-ray for now  You agreed to call cardiology to follow up with them about the episodes you have. If you have recurrent symptoms- would be good to get episode monitored immediately- calling 911 reasonable option especially if chest pain or shortness of breath

## 2017-01-27 NOTE — Telephone Encounter (Signed)
Called and spoke with patient and reiterated recommendation to seek care at ED. Pt was very upset that we would recommend this and states that the only way she will go to the ED is "in an ambulance". She went on to further state that she "has friends who have waited all day in the ED just to be sent home because no one took them seriously". When I was finally able to speak, I suggested she go to UC or be seen here by another provider. She states "if Dr. Yong Channel can't see me then he has too many patients". I again attempted to offer her an appt with another provider and she continued to refuse. She finished the conversation by saying she "will be finding another doctor who can help her".   Spoke with Rachel Bo and he called and talked to pt. She is now scheduled to see Dr. Yong Channel this afternoon at 2pm.

## 2017-01-27 NOTE — Telephone Encounter (Signed)
[place] [facility] Patient Name: Bridget Lane DOB: 1933-06-24 Initial Comment Caller states she feels like she is in a fib, no chest pain Nurse Assessment Nurse: Chesley Noon, RN, Lattie Haw Date/Time (Eastern Time): 01/27/2017 10:25:21 AM Confirm and document reason for call. If symptomatic, describe symptoms. ---Caller states she had afib about 4 years ago, she feels more tired lately x 1 month. Pulse was normal this am. Yesterday when she woke up her heart rate was "crazy", she went to church yesterday and a nurse there said her heartbeat as erratic. She feels funny in her chest for about 1 month. Does the patient have any new or worsening symptoms? ---Yes Will a triage be completed? ---Yes Related visit to physician within the last 2 weeks? ---No Does the PT have any chronic conditions? (i.e. diabetes, asthma, etc.) ---Yes List chronic conditions. ---history of afib Is this a behavioral health or substance abuse call? ---No Guidelines Guideline Title Affirmed Question Affirmed Notes Heart Rate and Heartbeat Questions New or worsened shortness of breath with activity (dyspnea on exertion) Final Disposition User Go to ED Now (or PCP triage) Chesley Noon, RN, [place] REFUSED Disagree/Comply: Disagree Disagree/Comply Reason: Disagree with instructions

## 2017-01-27 NOTE — Progress Notes (Signed)
Pre visit review using our clinic review tool, if applicable. No additional management support is needed unless otherwise documented below in the visit note. 

## 2017-01-27 NOTE — Progress Notes (Signed)
Subjective:  Bridget Lane is a 83 y.o. year old very pleasant female patient who presents for/with See problem oriented charting ROS- denies chest pain. Some very mild shortness of breath- similar to baseline and not worsening. No diaphoresis. No left arm or neck or jaw pain.  Mild increase in fatigue  Past Medical History-  Patient Active Problem List   Diagnosis Date Noted  . Atrial fibrillation 06/14/2011    Priority: High  . Insomnia 08/25/2015    Priority: Medium  . Spinal stenosis in cervical region 05/29/2015    Priority: Medium  . Hyperglycemia 06/07/2014    Priority: Medium  . Hyperlipidemia 01/28/2008    Priority: Medium  . Cervical lymphadenopathy 05/16/2016    Priority: Low  . History of Helicobacter pylori infection 07/06/2014    Priority: Low  . OSA (obstructive sleep apnea) 02/11/2012    Priority: Low  . Long term (current) use of anticoagulants 07/19/2011    Priority: Low  . Chest pain 06/14/2011    Priority: Low  . Depression 01/30/2010    Priority: Low  . History of colonic polyps 01/30/2010    Priority: Low  . ESOPHAGEAL STRICTURE 07/20/2008    Priority: Low  . Osteoarthritis 07/20/2008    Priority: Low  . Multiple thyroid nodules 01/28/2008    Priority: Low  . GERD 01/28/2008    Priority: Low    Medications- reviewed and updated Current Outpatient Prescriptions  Medication Sig Dispense Refill  . docusate sodium (COLACE) 100 MG capsule Take 100 mg by mouth daily as needed for mild constipation.    Marland Kitchen ELIQUIS 5 MG TABS tablet TAKE 1 TABLET BY MOUTH TWICE DAILY 60 tablet 5  . flecainide (TAMBOCOR) 100 MG tablet TAKE 1 TABLET(100 MG) BY MOUTH TWICE DAILY 180 tablet 3  . metoprolol tartrate (LOPRESSOR) 25 MG tablet Take 0.5 tablets (12.5 mg total) by mouth 2 (two) times daily. 30 tablet 11  . Multiple Vitamin (MULTIVITAMIN) tablet Take 1 tablet by mouth daily.    Marland Kitchen omeprazole (PRILOSEC) 40 MG capsule TAKE 1 CAPSULE BY MOUTH EVERY DAY 90 capsule 0  .  tiZANidine (ZANAFLEX) 4 MG tablet Take 4 mg by mouth 2 (two) times daily as needed.  0  . zolpidem (AMBIEN) 5 MG tablet TAKE 1 TABLET BY MOUTH AT BEDTIME AS NEEDED FOR SLEEP 30 tablet 5   No current facility-administered medications for this visit.     Objective: BP (!) 152/72 (BP Location: Left Arm, Patient Position: Sitting, Cuff Size: Normal)   Pulse (!) 58   Temp 97.8 F (36.6 C) (Oral)   Ht 5' 3.5" (1.613 m)   Wt 171 lb 12.8 oz (77.9 kg)   SpO2 98%   BMI 29.96 kg/m  Gen: NAD, resting comfortably CV: RRR no murmurs rubs or gallops Lungs: CTAB no crackles, wheeze, rhonchi Abdomen: soft/nontender/nondistended/normal bowel sounds.  Ext: no edema Skin: warm, dry  EKG: sinus bradycardia, normal intervals, no hypertrophy, no st or t wave changes Assessment/Plan:  Irregular heart rate - Plan: EKG 12-Lead, CBC, Comprehensive metabolic panel, TSH Fatigue, unspecified type - Plan: CBC, Comprehensive metabolic panel, TSH S: patient has been feeling slightly more tired than usual. Saturday night and Sunday morning woke up and heart was racing but turned over on side and then better after a while. Checked pulse at home on Sunday and felt irregular. Nurse at church noted HR was also irregular.   Feels like she is slightly more winded than normal. No swelling in the legs  or calf pain. No recent prolonged travel  Still walking 2 miles a day over the last month over the last month.able to do this without shortness of breath.   patient with history of atrial fibrillation. She is on eliquis. She is also on flecainide and metoprolol 12.5mg  BID. Last seen by Dr. Aundra Dubin in 08/2016 and noted to be maintaining NSR on flecainide and tolerating bradycardia. MIld disease on left heart cath 2013.  A/P: Patient with irregular heart rate/palpitations at home- on EKG she is in sinus rhythm. Certainly possible she went into atrial fibrillation or other rhythm. She feels fine today other than some mild fatigue  compared to baseline- cbc, cmp, tsh were unrevealing. Advised CXR for SOB portion which she declines. I have advised her to follow up with her cardiologist to consider monitoring at home- she agrees to call.   BP Readings from Last 3 Encounters:  01/27/17 (!) 152/72  11/05/16 132/62  09/11/16 132/82  BP up today slightly- admits may have some anxiety over events of weekend. We will trend as has been well controlled on last 2 checks.   Orders Placed This Encounter  Procedures  . CBC    [facility]  . Comprehensive metabolic panel    [place]  . TSH    [place]  . EKG 12-Lead    Order Specific Question:   Where should this test be performed    Answer:   Other   Return precautions advised. Particularly precautions to call 911/ems Garret Reddish, MD

## 2017-03-05 ENCOUNTER — Telehealth: Payer: Self-pay

## 2017-03-05 NOTE — Telephone Encounter (Signed)
Pt called with c/o increased nasal/sinus congestion, dry cough, pnd and some ear fullness. She traveled on plane to [place] and has been sick since. She has not taken any OTC medications. Advised pt to try Coricidin HBP, Delsym and saline nasal spray as well as increase fluid intake. She will try these and call office if not improving. Nothing further needed at this time.

## 2017-03-13 DIAGNOSIS — H43813 Vitreous degeneration, bilateral: Secondary | ICD-10-CM | POA: Diagnosis not present

## 2017-03-13 DIAGNOSIS — H353132 Nonexudative age-related macular degeneration, bilateral, intermediate dry stage: Secondary | ICD-10-CM | POA: Diagnosis not present

## 2017-04-01 ENCOUNTER — Other Ambulatory Visit: Payer: Self-pay | Admitting: Gastroenterology

## 2017-04-08 DIAGNOSIS — L820 Inflamed seborrheic keratosis: Secondary | ICD-10-CM | POA: Diagnosis not present

## 2017-04-08 DIAGNOSIS — L570 Actinic keratosis: Secondary | ICD-10-CM | POA: Diagnosis not present

## 2017-04-08 DIAGNOSIS — D225 Melanocytic nevi of trunk: Secondary | ICD-10-CM | POA: Diagnosis not present

## 2017-04-08 DIAGNOSIS — X32XXXD Exposure to sunlight, subsequent encounter: Secondary | ICD-10-CM | POA: Diagnosis not present

## 2017-05-16 ENCOUNTER — Other Ambulatory Visit: Payer: Self-pay | Admitting: Cardiology

## 2017-05-19 NOTE — Telephone Encounter (Signed)
Followed by Dr End 

## 2017-05-19 NOTE — Telephone Encounter (Signed)
Age 83 years  Saw Dr Aundra Dubin 09/11/2016 01/27/2017 SrCr 0.94  01/27/2017 Hgb 12.2 HCT 36.4 Wt 77.9kg 01/27/2017  Refill done for Eliquis 5mg  every 12 hours as requested

## 2017-06-10 DIAGNOSIS — G5601 Carpal tunnel syndrome, right upper limb: Secondary | ICD-10-CM | POA: Diagnosis not present

## 2017-06-10 DIAGNOSIS — M25511 Pain in right shoulder: Secondary | ICD-10-CM | POA: Diagnosis not present

## 2017-06-27 ENCOUNTER — Other Ambulatory Visit: Payer: Self-pay | Admitting: Gastroenterology

## 2017-07-01 DIAGNOSIS — G5603 Carpal tunnel syndrome, bilateral upper limbs: Secondary | ICD-10-CM | POA: Diagnosis not present

## 2017-07-09 ENCOUNTER — Telehealth: Payer: Self-pay | Admitting: Gastroenterology

## 2017-07-09 MED ORDER — OMEPRAZOLE 40 MG PO CPDR: 40.0000 mg | DELAYED_RELEASE_CAPSULE | Freq: Every day | ORAL | 2 refills | Status: DC

## 2017-07-09 NOTE — Telephone Encounter (Signed)
Patient states she is out of medication and needs a refill for omeprazole until f-u appt on 11.29.18

## 2017-07-09 NOTE — Telephone Encounter (Signed)
Omeprazole sent to pharmacy.

## 2017-07-10 ENCOUNTER — Encounter: Payer: Self-pay | Admitting: Gastroenterology

## 2017-07-11 ENCOUNTER — Telehealth: Payer: Self-pay | Admitting: Gastroenterology

## 2017-07-11 MED ORDER — OMEPRAZOLE 40 MG PO CPDR: 40.0000 mg | DELAYED_RELEASE_CAPSULE | Freq: Every day | ORAL | 3 refills | Status: DC

## 2017-07-11 NOTE — Telephone Encounter (Signed)
Omeprazole sent to pharmacy.

## 2017-07-15 DIAGNOSIS — M25511 Pain in right shoulder: Secondary | ICD-10-CM | POA: Diagnosis not present

## 2017-07-15 DIAGNOSIS — G5603 Carpal tunnel syndrome, bilateral upper limbs: Secondary | ICD-10-CM | POA: Diagnosis not present

## 2017-07-18 ENCOUNTER — Telehealth: Payer: Self-pay | Admitting: Gastroenterology

## 2017-07-18 NOTE — Telephone Encounter (Signed)
Patient is approved for omeprazole for once daily for 1 year

## 2017-07-18 NOTE — Telephone Encounter (Signed)
Prior authorization done through Cover [employer] waiting on approval

## 2017-08-13 ENCOUNTER — Telehealth: Payer: Self-pay | Admitting: Internal Medicine

## 2017-08-13 NOTE — Telephone Encounter (Signed)
Spoke with patient and she stated that she is in the donut hole and the rx cost her over $100/month. I made her aware that I could place two weeks of samples at the front desk. She stated that would not be helpful to her as she would still need to refill the prescription. She states that she will just have it refilled at the pharmacy. Patient thanked me for returning her call.

## 2017-08-13 NOTE — Telephone Encounter (Signed)
Patient calling the office for samples of medication:   1.  What medication and dosage are you requesting samples for? Eliquis he take 2 a day  2.  Are you currently out of this medication? Pt says she is in the donut hole-she needs it for 2 months please

## 2017-08-28 ENCOUNTER — Telehealth: Payer: Self-pay

## 2017-08-28 NOTE — Telephone Encounter (Signed)
Spoke with the patient. She reports she had taken Omeprazole BID for a long time. She was off of the medication for about a month while waiting for insurance to approve it again. Omeprazole was approved but reduced to once daily. She has had reflux symptoms. Lately she has felt like food is sticking but describes the location as "at my ribs." She had thought she might be constipated. She has taken a "pill" form laxative and then MOM for a couple of days. She has had liquid bowel movements that are black. She says she has taken laxatives before and this is not what she normally experiences. She would like to be seen. Appointment scheduled.

## 2017-08-29 ENCOUNTER — Encounter: Payer: Self-pay | Admitting: Gastroenterology

## 2017-08-29 ENCOUNTER — Ambulatory Visit (INDEPENDENT_AMBULATORY_CARE_PROVIDER_SITE_OTHER): Payer: Medicare Other | Admitting: Gastroenterology

## 2017-08-29 VITALS — BP 130/66 | HR 72 | Ht 63.0 in | Wt 177.2 lb

## 2017-08-29 DIAGNOSIS — K219 Gastro-esophageal reflux disease without esophagitis: Secondary | ICD-10-CM

## 2017-08-29 DIAGNOSIS — K5900 Constipation, unspecified: Secondary | ICD-10-CM

## 2017-08-29 NOTE — Progress Notes (Signed)
08/29/2017 Bridget Lane [id number] [birth year]   HISTORY OF PRESENT ILLNESS: This is an 83 year old female who is known to Dr. Silverio Decamp.  She presents to our office today with complaints of constipation.  She tells me that she never moves her bowels regularly, but then several days ago she realized that it had been several days since she had her last bowel movement.  She took 1 pill of what sounds like was probably Dulcolax and some milk of magnesia, but had very little results of some black colored liquid stool/diarrhea.  She has not passed any more stool since that time.  She thinks that she has some type of "blockage".  She denies any abdominal pain.  Says that she just feels full and bloated.  Has gained 11 pounds and is contributing it to being full of stool.  Her last colonoscopy was 3 years ago at which time she was found to have several small polyps that were removed.  She had initially been put in for 3-year colonoscopy recall, but Dr. Silverio Decamp said that she did not need this due to age.  She is also complaining of reflux and belching.  She was previously on omeprazole 40 mg twice daily, but recently her insurance company will no longer cover twice a day medication.    Past Medical History:  Diagnosis Date  . Atrial fibrillation ([place])    persistent  . Colon polyp 2005  . Depression   . DJD (degenerative joint disease)   . Dysrhythmia   . Esophageal stricture   . GERD (gastroesophageal reflux disease)   . H. pylori infection   . Hiatal hernia   . Hyperlipidemia   . Macular degeneration   . Nontoxic multinodular goiter   . OA (osteoarthritis)   . Postherpetic neuralgia at T3-T5 level 04/27/2011  . Rectal bleeding 07/06/2014   Hemorrhoid related in past.     Past Surgical History:  Procedure Laterality Date  . APPENDECTOMY    . CARDIAC CATHETERIZATION    . CHOLECYSTECTOMY  1989  . COLONOSCOPY W/ POLYPECTOMY  2005   Neg in 2010; Dr Olevia Perches  . ESOPHAGEAL DILATION     > 3  X; Dr Olevia Perches  . TOTAL ABDOMINAL HYSTERECTOMY  1972   for pain (no BSO)  . TUBAL LIGATION     with appendectomy  . UPPER GI ENDOSCOPY  2010   H pylori    reports that  has never smoked. she has never used smokeless tobacco. She reports that she does not drink alcohol or use drugs. family history includes Breast cancer in her maternal aunt and sister; Colon cancer in her father; Coronary artery disease in her brother, father, and mother; Diabetes in her mother and sister; Heart failure in her mother; Hypothyroidism in her sister; Osteoarthritis in her father; Stroke in her sister; Throat cancer in her brother. Allergies  Allergen Reactions  . Penicillins Rash  . Codeine Other (See Comments)    hallucinations  . Acetaminophen Other (See Comments)    hurts stomach  . Dabigatran Etexilate Mesylate Other (See Comments)     All extremities feel heavy and hurt  . Sulfa Antibiotics Rash  . Sulfonamide Derivatives Rash      Outpatient Encounter Medications as of 08/29/2017  Medication Sig  . docusate sodium (COLACE) 100 MG capsule Take 100 mg by mouth daily as needed for mild constipation.  Marland Kitchen ELIQUIS 5 MG TABS tablet TAKE 1 TABLET BY MOUTH TWICE DAILY  . flecainide (TAMBOCOR)  100 MG tablet TAKE 1 TABLET(100 MG) BY MOUTH TWICE DAILY  . metoprolol tartrate (LOPRESSOR) 25 MG tablet Take 0.5 tablets (12.5 mg total) by mouth 2 (two) times daily.  . Multiple Vitamin (MULTIVITAMIN) tablet Take 1 tablet by mouth daily.  Marland Kitchen omeprazole (PRILOSEC) 40 MG capsule Take 1 capsule (40 mg total) by mouth daily.  Marland Kitchen tiZANidine (ZANAFLEX) 4 MG tablet Take 4 mg by mouth 2 (two) times daily as needed.  . zolpidem (AMBIEN) 5 MG tablet TAKE 1 TABLET BY MOUTH AT BEDTIME AS NEEDED FOR SLEEP (Patient taking differently: as needed)   No facility-administered encounter medications on file as of 08/29/2017.      REVIEW OF SYSTEMS  : All other systems reviewed and negative except where noted in the History of Present  Illness.   PHYSICAL EXAM: BP 130/66   Pulse 72   Ht 5\' 3"  (1.6 m)   Wt 177 lb 4 oz (80.4 kg)   BMI 31.40 kg/m  General: Well developed white female in no acute distress Head: Normocephalic and atraumatic Eyes:  Sclerae anicteric, conjunctiva pink. Ears: Normal auditory acuity Lungs: Clear throughout to auscultation; no increased WOB. Heart: Regular rate and rhythm; no M/R/G. Abdomen: Soft, non-distended.  BS present.  Non-tender. Musculoskeletal: Symmetrical with no gross deformities  Skin: No lesions on visible extremities Extremities: No edema  Neurological: Alert oriented x 4, grossly non-focal Psychological:  Alert and cooperative. Normal mood and affect  ASSESSMENT AND PLAN: *Constipation:  Had several days without a BM then tried some MOM and what sounds like a Dulcolax without relief.  Will get two bottles of magnesium citrate.  She will drink one tonight and if she does not have a BM within 3 hours then she will drink the other.   *GERD:  Belching and reflux.  Was better on BID PPI in the past but insurance will not cover BID.  I advised her that she could get OTC omeprazole and take that as her second dose and see how she does with that.  **She will call on Monday or Tuesday with an update of her symptoms.   CC:  [name], MD

## 2017-08-29 NOTE — Patient Instructions (Addendum)
Get 2 bottles of Magnesium Citrate at the pharmacy. You can drink 1 bottle this evening. If no bowel movment within 3 hours you can drink the second bottle.  Call us back Monday 11-12. Call [phone], choose option 2 and ask for Beth.   We would like to know if you had any bowel movements after drinking the magesium citrate.  Take over the counter omeprazole in addition to your daytime omeprazole 40 mg.

## 2017-09-01 DIAGNOSIS — M5023 Other cervical disc displacement, cervicothoracic region: Secondary | ICD-10-CM | POA: Diagnosis not present

## 2017-09-08 NOTE — Progress Notes (Signed)
Reviewed and agree with documentation and assessment and plan. K. Veena Nandigam , MD   

## 2017-09-15 ENCOUNTER — Ambulatory Visit: Payer: Medicare Other | Admitting: Internal Medicine

## 2017-09-15 ENCOUNTER — Encounter: Payer: Self-pay | Admitting: Internal Medicine

## 2017-09-15 VITALS — BP 110/62 | HR 72 | Ht 63.0 in | Wt 170.0 lb

## 2017-09-15 DIAGNOSIS — R0600 Dyspnea, unspecified: Secondary | ICD-10-CM

## 2017-09-15 DIAGNOSIS — R0609 Other forms of dyspnea: Secondary | ICD-10-CM

## 2017-09-15 DIAGNOSIS — H43813 Vitreous degeneration, bilateral: Secondary | ICD-10-CM | POA: Diagnosis not present

## 2017-09-15 DIAGNOSIS — I480 Paroxysmal atrial fibrillation: Secondary | ICD-10-CM | POA: Diagnosis not present

## 2017-09-15 DIAGNOSIS — H353132 Nonexudative age-related macular degeneration, bilateral, intermediate dry stage: Secondary | ICD-10-CM | POA: Diagnosis not present

## 2017-09-15 NOTE — Patient Instructions (Addendum)
Medication Instructions:  Your physician recommends that you continue on your current medications as directed. Please refer to the Current Medication list given to you today.  It is important not to miss any doses your medication, especially your Eliquis.  Labwork: None   Testing/Procedures: None   Follow-Up: Your physician recommends that you schedule a follow-up appointment with Roderic Palau, NP in the [facility]. This is scheduled for Monday December 10 ,2018 at 10:30 AM. The phone number is [phone]. It is in the [facility] at [facility] The code for December is 8002.  Your physician wants you to follow-up in: 6 months with Dr Saunders Revel, (May 2019) . You will receive a reminder letter in the mail two months in advance. If you don't receive a letter, please call our office to schedule the follow-up appointment.     If you need a refill on your cardiac medications before your next appointment, please call your pharmacy.

## 2017-09-15 NOTE — Progress Notes (Signed)
Follow-up Outpatient Visit Date: 09/15/2017  Primary Care Provider: [name], [place] [address]  Chief Complaint: Fatigue  HPI:  Bridget Lane is a 83 y.o. year-old female with history of paroxysmal atrial fibrillation, hyperlipidemia, mild, nonobstructive CAD by catheterization in 2013, and GERD, who presents for follow-up of her small atrial fibrillation. She was previously followed in our office by Dr. Aundra Dubin, having last been seen in 08/2016. Over the last year, Ms. Luepke has had a few episodes where she felt as though she might be in atrial fibrillation. She has a tough time describing the episodes but feels as though something is not quite right in her chest. This usually happens when she wakes up at night. She has noted lightheadedness on 2 occasions, but does not believe that either corresponded to her suspected a-fib episodes. Over the last 6 months, Ms. Heffler has noticed more exertional dyspnea.  Ms. Mergen has not had any chest pain, orthopnea, PND, or edema. She remains compliant with her medications, including metoprolol, flecainide, and apixaban. However, she notes occasionally missing a dose of apixaban, including this past weekend. She denies bleeding.  --------------------------------------------------------------------------------------------------  PMH: 1. BPPV 2. OA 3. GERD 4. Hyperlipidemia 5. Paroxysmal atrial fibrillation 6. Multinodular goiter 7. Cholecystectomy 8. Appendectomy 9. Echo (7/13) with EF 55-60% 10. LHC/RHC (7/13) with mild nonobstructive CAD, LAD bridging; mean RA 4, PA 28/7, mean PCWP 7. 11. Esophageal dysmotility.  12. 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  Current Meds  Medication Sig  . docusate sodium (COLACE) 100 MG capsule Take 100 mg by mouth daily as needed for mild constipation.  Marland Kitchen ELIQUIS 5 MG TABS tablet TAKE 1 TABLET BY MOUTH TWICE DAILY  . flecainide (TAMBOCOR) 100 MG tablet TAKE 1 TABLET(100 MG) BY  MOUTH TWICE DAILY  . metoprolol tartrate (LOPRESSOR) 25 MG tablet Take 0.5 tablets (12.5 mg total) by mouth 2 (two) times daily.  . Multiple Vitamin (MULTIVITAMIN) tablet Take 1 tablet by mouth daily.  Marland Kitchen omeprazole (PRILOSEC) 40 MG capsule Take 1 capsule (40 mg total) by mouth daily.  Marland Kitchen tiZANidine (ZANAFLEX) 4 MG tablet Take 4 mg by mouth 2 (two) times daily as needed.  . zolpidem (AMBIEN) 5 MG tablet TAKE 1 TABLET BY MOUTH AT BEDTIME AS NEEDED FOR SLEEP (Patient taking differently: as needed)    Allergies: Penicillins; Codeine; Acetaminophen; Dabigatran etexilate mesylate; Sulfa antibiotics; and Sulfonamide derivatives  Social History   Socioeconomic History  . Marital status: Married    Spouse name: Not on file  . Number of children: 4  . Years of education: Not on file  . Highest education level: Not on file  Social Needs  . Financial resource strain: Not on file  . Food insecurity - worry: Not on file  . Food insecurity - inability: Not on file  . Transportation needs - medical: Not on file  . Transportation needs - non-medical: Not on file  Occupational History  . Occupation: retired  Tobacco Use  . Smoking status: Never Smoker  . Smokeless tobacco: Never Used  Substance and Sexual Activity  . Alcohol use: No  . Drug use: No  . Sexual activity: Not on file  Other Topics Concern  . Not on file  Social History Narrative   Married 1956. 4 children 2 boys 2 girls. 16 grandkids.  5 greatgrandkids.    Pt lives in [place] with spouse.        Retired from [employer]  Hobbies: travel, spend time with people, family time   Exercise-walking      No HCPOA-advised to do this.     Family History  Problem Relation Age of Onset  . Heart failure Mother   . Coronary artery disease Mother   . Diabetes Mother   . Colon cancer Father        in 49s  . Osteoarthritis Father   . Coronary artery disease Father   . Breast cancer Sister   . Diabetes  Sister   . Stroke Sister   . Coronary artery disease Brother   . Throat cancer Brother        smoked  . Breast cancer Maternal Aunt   . Hypothyroidism Sister     Review of Systems: Review of Systems  Constitutional: Positive for malaise/fatigue.  HENT: Negative.   Eyes: Positive for blurred vision.  Respiratory: Positive for shortness of breath.   Cardiovascular: Positive for palpitations.  Gastrointestinal: Positive for abdominal pain and diarrhea.  Genitourinary: Negative.   Musculoskeletal: Positive for myalgias.  Skin: Negative.   Neurological: Positive for dizziness.  Endo/Heme/Allergies: Bruises/bleeds easily.  Psychiatric/Behavioral: Negative.    --------------------------------------------------------------------------------------------------  Physical Exam: BP 110/62   Pulse 72   Ht 5\' 3"  (1.6 m)   Wt 170 lb (77.1 kg)   SpO2 97%   BMI 30.11 kg/m   General:  Overweight elderly woman, seated comfortably in the exam room. HEENT: No conjunctival pallor or scleral icterus. Moist mucous membranes.  OP clear. Neck: Supple without lymphadenopathy, thyromegaly, JVD, or HJR. No carotid bruit. Lungs: Normal work of breathing. Clear to auscultation bilaterally without wheezes or crackles. Heart: Irregularly irregular without murmurs.. Non-displaced PMI. Abd: Bowel sounds present. Soft, NT/ND without hepatosplenomegaly Ext: No lower extremity edema. Radial, PT, and DP pulses are 2+ bilaterally. Skin: Warm and dry without rash.  EKG:  Atrial fibrillation (ventricular rate 67 bpm). Otherwise, no significant abnormalities. Atrial fibrillation is new since 01/2017.  Lab Results  Component Value Date   WBC 6.8 01/27/2017   HGB 12.2 01/27/2017   HCT 36.4 01/27/2017   MCV 85.2 01/27/2017   PLT 289.0 01/27/2017    Lab Results  Component Value Date   NA 140 01/27/2017   K 4.1 01/27/2017   CL 106 01/27/2017   CO2 29 01/27/2017   BUN 19 01/27/2017   CREATININE 0.94  01/27/2017   GLUCOSE 87 01/27/2017   ALT 15 01/27/2017    Lab Results  Component Value Date   CHOL 214 (H) 11/01/2016   HDL 53.50 11/01/2016   LDLCALC 127 (H) 11/01/2016   TRIG 164.0 (H) 11/01/2016   CHOLHDL 4 11/01/2016    --------------------------------------------------------------------------------------------------  ASSESSMENT AND PLAN: Paroxysmal atrial fibrillation Patient is in atrial fibrillation today, though she is largely unaware of this. I wonder if some of her fatigue and exertional dyspnea is reflective of having been in atrial fibrillation for a few months. Heart rate is adequately controlled today. We have agreed to continue her current medications, though in light of her age and nonobstructive CAD in 2013, it may be worthwhile to consider something other than flecainide or repeat an echo and noninvasive ischemia evaluation at some point. However, first I would like to see if we can get her out of atrial fibrillation. I will have her follow-up in the A. fib clinic in 2 weeks. If she remains in atrial fibrillation and has been compliant with anticoagulation, she could be set up for elective cardioversion in about a month from  now.  Dyspnea on exertion Symptoms are nonspecific but could certainly be related to atrial fibrillation. She does not convert back to sinus rhythm on her own in the coming month, we will proceed with cardioversion. We will then readdress echocardiogram and stress test after restoration of sinus rhythm.  Follow-up: Reevaluation in the atrial fibrillation clinic in 2 weeks. Return to see me based on findings at that visit.  Nelva Bush, MD 09/15/2017 4:35 PM

## 2017-09-16 ENCOUNTER — Encounter: Payer: Self-pay | Admitting: Internal Medicine

## 2017-09-18 ENCOUNTER — Ambulatory Visit: Payer: Medicare Other | Admitting: Gastroenterology

## 2017-09-18 ENCOUNTER — Telehealth: Payer: Self-pay | Admitting: Gastroenterology

## 2017-09-18 NOTE — Telephone Encounter (Signed)
No Charge 

## 2017-09-29 ENCOUNTER — Ambulatory Visit (HOSPITAL_COMMUNITY): Payer: Medicare Other | Admitting: Nurse Practitioner

## 2017-10-02 ENCOUNTER — Ambulatory Visit (HOSPITAL_COMMUNITY)
Admission: RE | Admit: 2017-10-02 | Discharge: 2017-10-02 | Disposition: A | Discharge status: Home / Self Care | Payer: Medicare Other | Source: Ambulatory Visit | Attending: Nurse Practitioner | Admitting: Nurse Practitioner

## 2017-10-02 ENCOUNTER — Encounter (HOSPITAL_COMMUNITY): Payer: Self-pay | Admitting: Nurse Practitioner

## 2017-10-02 VITALS — BP 114/62 | HR 63 | Ht 63.0 in | Wt 172.0 lb

## 2017-10-02 DIAGNOSIS — Z808 Family history of malignant neoplasm of other organs or systems: Secondary | ICD-10-CM | POA: Diagnosis not present

## 2017-10-02 DIAGNOSIS — Z9851 Tubal ligation status: Secondary | ICD-10-CM | POA: Insufficient documentation

## 2017-10-02 DIAGNOSIS — I480 Paroxysmal atrial fibrillation: Secondary | ICD-10-CM | POA: Insufficient documentation

## 2017-10-02 DIAGNOSIS — Z833 Family history of diabetes mellitus: Secondary | ICD-10-CM | POA: Diagnosis not present

## 2017-10-02 DIAGNOSIS — K219 Gastro-esophageal reflux disease without esophagitis: Secondary | ICD-10-CM | POA: Diagnosis not present

## 2017-10-02 DIAGNOSIS — Z882 Allergy status to sulfonamides status: Secondary | ICD-10-CM | POA: Insufficient documentation

## 2017-10-02 DIAGNOSIS — Z803 Family history of malignant neoplasm of breast: Secondary | ICD-10-CM | POA: Insufficient documentation

## 2017-10-02 DIAGNOSIS — Z90710 Acquired absence of both cervix and uterus: Secondary | ICD-10-CM | POA: Diagnosis not present

## 2017-10-02 DIAGNOSIS — Z9049 Acquired absence of other specified parts of digestive tract: Secondary | ICD-10-CM | POA: Diagnosis not present

## 2017-10-02 DIAGNOSIS — Z823 Family history of stroke: Secondary | ICD-10-CM | POA: Insufficient documentation

## 2017-10-02 DIAGNOSIS — Z79899 Other long term (current) drug therapy: Secondary | ICD-10-CM | POA: Insufficient documentation

## 2017-10-02 DIAGNOSIS — Z880 Allergy status to penicillin: Secondary | ICD-10-CM | POA: Diagnosis not present

## 2017-10-02 NOTE — Progress Notes (Signed)
Primary Care Physician: [name], MD Referring Physician: Dr. Marney Doctor ANTIONETTA ATOR is a 83 y.o. female with a h/o paroxysmal atrial fibrillation, since 2012-1013, on flecainide, hyperlipidemia, mild, nonobstructive CAD by catheterization in 2013, and GERD, who presented for follow-up with Dr. Saunders Revel 11/26 and found  to be in afib. She had noted  some shortness of breath over the last few months. She was asked to be evaluated in the afib clinc.  Today, the pt reports for  the last 7-10 days, she has been able to do her house hold duties such as vacuuming much easier and felt that her energy was better.EKG shows SR. She continues on flecainide 100 mg bid   She denies tobacco,alcohol,caffeine use. Denies snoring history.  Today, she denies symptoms of palpitations, chest pain, shortness of breath, orthopnea, PND, lower extremity edema, dizziness, presyncope, syncope, or neurologic sequela. The patient is tolerating medications without difficulties and is otherwise without complaint today.   Past Medical History:  Diagnosis Date  . Atrial fibrillation ([place])    persistent  . Colon polyp 2005  . Depression   . DJD (degenerative joint disease)   . Dysrhythmia   . Esophageal stricture   . GERD (gastroesophageal reflux disease)   . H. pylori infection   . Hiatal hernia   . Hyperlipidemia   . Macular degeneration   . Nontoxic multinodular goiter   . OA (osteoarthritis)   . Postherpetic neuralgia at T3-T5 level 04/27/2011  . Rectal bleeding 07/06/2014   Hemorrhoid related in past.     Past Surgical History:  Procedure Laterality Date  . ANTERIOR CERVICAL DECOMP/DISCECTOMY FUSION N/A 05/29/2015   Procedure: ANTERIOR CERVICAL DECOMPRESSION/DISCECTOMY FUSION CERVICAL FOUR-FIVE,CERVICAL FIVE-SIX;  Surgeon: Kary Kos, MD;  Location: [facility];  Service: Neurosurgery;  Laterality: N/A;  . APPENDECTOMY    . CARDIAC CATHETERIZATION    . CARDIOVERSION  08/29/2011   Procedure:  CARDIOVERSION;  Surgeon: Bing Quarry, MD;  Location: [facility];  Service: Cardiovascular;  Laterality: N/A;  . CARDIOVERSION  11/22/2011   Procedure: CARDIOVERSION;  Surgeon: Coralyn Mark, MD;  Location: [facility];  Service: Cardiovascular;  Laterality: N/A;  . CHOLECYSTECTOMY  1989  . COLONOSCOPY W/ POLYPECTOMY  2005   Neg in 2010; Dr Olevia Perches  . ESOPHAGEAL DILATION     > 3 X; Dr Olevia Perches  . LEFT AND RIGHT HEART CATHETERIZATION WITH CORONARY ANGIOGRAM N/A 12/02/2011   Procedure: LEFT AND RIGHT HEART CATHETERIZATION WITH CORONARY ANGIOGRAM;  Surgeon: Jolaine Artist, MD;  [facility];  Service: Cardiovascular;  Laterality: N/A;  . TOTAL ABDOMINAL HYSTERECTOMY  1972   for pain (no BSO)  . TUBAL LIGATION     with appendectomy  . UPPER GI ENDOSCOPY  2010   H pylori    Current Outpatient Medications  Medication Sig Dispense Refill  . docusate sodium (COLACE) 100 MG capsule Take 100 mg by mouth daily as needed for mild constipation.    Marland Kitchen ELIQUIS 5 MG TABS tablet TAKE 1 TABLET BY MOUTH TWICE DAILY 60 tablet 5  . flecainide (TAMBOCOR) 100 MG tablet TAKE 1 TABLET(100 MG) BY MOUTH TWICE DAILY 180 tablet 3  . metoprolol tartrate (LOPRESSOR) 25 MG tablet Take 0.5 tablets (12.5 mg total) by mouth 2 (two) times daily. 30 tablet 11  . Multiple Vitamin (MULTIVITAMIN) tablet Take 1 tablet by mouth daily.    Marland Kitchen omeprazole (PRILOSEC) 40 MG capsule Take 1 capsule (40 mg total) by mouth daily. 90 capsule  3  . tiZANidine (ZANAFLEX) 4 MG tablet Take 4 mg by mouth 2 (two) times daily as needed.  0  . zolpidem (AMBIEN) 5 MG tablet TAKE 1 TABLET BY MOUTH AT BEDTIME AS NEEDED FOR SLEEP (Patient not taking: Reported on 10/02/2017) 30 tablet 5   No current facility-administered medications for this encounter.     Allergies  Allergen Reactions  . Penicillins Rash  . Codeine Other (See Comments)    hallucinations  . Acetaminophen Other (See Comments)    hurts stomach  . Dabigatran Etexilate Mesylate Other  (See Comments)     All extremities feel heavy and hurt  . Sulfa Antibiotics Rash  . Sulfonamide Derivatives Rash    Social History   Socioeconomic History  . Marital status: Married    Spouse name: Not on file  . Number of children: 4  . Years of education: Not on file  . Highest education level: Not on file  Social Needs  . Financial resource strain: Not on file  . Food insecurity - worry: Not on file  . Food insecurity - inability: Not on file  . Transportation needs - medical: Not on file  . Transportation needs - non-medical: Not on file  Occupational History  . Occupation: retired  Tobacco Use  . Smoking status: Never Smoker  . Smokeless tobacco: Never Used  Substance and Sexual Activity  . Alcohol use: No  . Drug use: No  . Sexual activity: Not on file  Other Topics Concern  . Not on file  Social History Narrative   Married 1956. 4 children 2 boys 2 girls. 16 grandkids.  5 greatgrandkids.    Pt lives in [place] with spouse.        Retired from [employer]      Hobbies: travel, spend time with people, family time   Exercise-walking      No HCPOA-advised to do this.     Family History  Problem Relation Age of Onset  . Heart failure Mother   . Coronary artery disease Mother   . Diabetes Mother   . Colon cancer Father        in 58s  . Osteoarthritis Father   . Coronary artery disease Father   . Breast cancer Sister   . Diabetes Sister   . Stroke Sister   . Coronary artery disease Brother   . Throat cancer Brother        smoked  . Breast cancer Maternal Aunt   . Hypothyroidism Sister     ROS- All systems are reviewed and negative except as per the HPI above  Physical Exam: Vitals:   10/02/17 1033  BP: 114/62  Pulse: 63  Weight: 172 lb (78 kg)  Height: 5\' 3"  (1.6 m)   Wt Readings from Last 3 Encounters:  10/02/17 172 lb (78 kg)  09/15/17 170 lb (77.1 kg)  08/29/17 177 lb 4 oz (80.4 kg)    Labs: Lab Results    Component Value Date   NA 140 01/27/2017   K 4.1 01/27/2017   CL 106 01/27/2017   CO2 29 01/27/2017   GLUCOSE 87 01/27/2017   BUN 19 01/27/2017   CREATININE 0.94 01/27/2017   CALCIUM 9.4 01/27/2017   MG 1.8 09/08/2012   Lab Results  Component Value Date   INR 1.08 12/03/2011   Lab Results  Component Value Date   CHOL 214 (H) 11/01/2016   HDL 53.50 11/01/2016   LDLCALC 127 (H) 11/01/2016  TRIG 164.0 (H) 11/01/2016     GEN- The patient is well appearing, alert and oriented x 3 today.   Head- normocephalic, atraumatic Eyes-  Sclera clear, conjunctiva pink Ears- hearing intact Oropharynx- clear Neck- supple, no JVP Lymph- no cervical lymphadenopathy Lungs- Clear to ausculation bilaterally, normal work of breathing Heart- Regular rate and rhythm, no murmurs, rubs or gallops, PMI not laterally displaced GI- soft, NT, ND, + BS Extremities- no clubbing, cyanosis, or edema MS- no significant deformity or atrophy Skin- no rash or lesion Psych- euthymic mood, full affect Neuro- strength and sensation are intact  EKG-NSR at 63 bpm, IRBBB, pr int 204 ms, qrs int 102 ms, qtc 483 ms Epic records reviewed    Assessment and Plan: 1. Paroxysmal afib Pt is now back in rhythm   Dr. Saunders Revel mentioned in his note that he wanted to update stress test and echo with pt in SR and I will forward my note to make him aware that she is in SR and is available to have her tests scheduled For now continue flecainide 100 mg bid and may need future change of antiarrythmic if tests show abnormality or has  significant increase in afib burden She may also be a candidate for ablation but age may limit, but not entirely exclude ,her being an optimal candidate  F/u with Dr. Saunders Revel as planned  Afib clinic as needed  [place]. Ardyce Heyer, [facility] [address] [place], [place] [address] [phone]

## 2017-10-07 ENCOUNTER — Telehealth: Payer: Self-pay | Admitting: *Deleted

## 2017-10-07 DIAGNOSIS — I480 Paroxysmal atrial fibrillation: Secondary | ICD-10-CM

## 2017-10-07 NOTE — Telephone Encounter (Signed)
Discussed with patient, she agreed to have echocardiogram, will forward to [facility] to contact pt to arrange appt for echo.

## 2017-10-07 NOTE — Telephone Encounter (Signed)
-----   Message from Nelva Bush, MD sent at 10/07/2017  1:02 PM EST -----   ----- Message ----- From: Sherran Needs, NP Sent: 10/02/2017   1:22 PM To: Nelva Bush, MD  Hello Dr. Saunders Revel, Mrs Bruno showed up in [place] if you want to go ahead and schedule  her tests as mentioned in your note. Thank you, Butch Penny

## 2017-10-07 NOTE — Telephone Encounter (Signed)
LMTCB for pt 

## 2017-10-07 NOTE — Progress Notes (Signed)
I have reviewed the notes from Ms. Bridget Lane's recent visit in the a-fib clinic. Given that she is normal sinus rhythm, I suggest that we obtain a transthoracic echo to ensure that she does not have any structural abnormalities that are new since 2013. If the echo is normal, we will then discuss non-invasive ischemia evaluation. She should continue her current medications in the meantime and schedule an echo at her convenience.  Nelva Bush, MD [facility] Pager: [phone]

## 2017-10-07 NOTE — Telephone Encounter (Signed)
Author: Nelva Bush, MD Service: Cardiology Author Type: Physician  Filed: 10/07/2017 1:02 PM Date of Service: 10/02/2017 11:59 PM Status: Signed  Editor: Nelva Bush, MD (Physician)       [] Hide copied text  [] Hover for details   I have reviewed the notes from Ms. Mones's recent visit in the a-fib clinic. Given that she is normal sinus rhythm, I suggest that we obtain a transthoracic echo to ensure that she does not have any structural abnormalities that are new since 2013. If the echo is normal, we will then discuss non-invasive ischemia evaluation. She should continue her current medications in the meantime and schedule an echo at her convenience.  Nelva Bush, MD [facility] Pager: [phone]

## 2017-10-13 ENCOUNTER — Ambulatory Visit (HOSPITAL_COMMUNITY): Payer: Medicare Other | Attending: Cardiology

## 2017-10-13 ENCOUNTER — Other Ambulatory Visit: Payer: Self-pay

## 2017-10-13 DIAGNOSIS — I371 Nonrheumatic pulmonary valve insufficiency: Secondary | ICD-10-CM | POA: Diagnosis not present

## 2017-10-13 DIAGNOSIS — G4733 Obstructive sleep apnea (adult) (pediatric): Secondary | ICD-10-CM | POA: Insufficient documentation

## 2017-10-13 DIAGNOSIS — E785 Hyperlipidemia, unspecified: Secondary | ICD-10-CM | POA: Diagnosis not present

## 2017-10-13 DIAGNOSIS — I480 Paroxysmal atrial fibrillation: Secondary | ICD-10-CM | POA: Insufficient documentation

## 2017-10-13 DIAGNOSIS — I081 Rheumatic disorders of both mitral and tricuspid valves: Secondary | ICD-10-CM | POA: Insufficient documentation

## 2017-10-19 ENCOUNTER — Other Ambulatory Visit: Payer: Self-pay | Admitting: Cardiology

## 2017-10-20 ENCOUNTER — Telehealth: Payer: Self-pay | Admitting: Internal Medicine

## 2017-10-20 ENCOUNTER — Other Ambulatory Visit: Payer: Self-pay | Admitting: Cardiology

## 2017-10-20 DIAGNOSIS — R0602 Shortness of breath: Secondary | ICD-10-CM

## 2017-10-20 NOTE — Telephone Encounter (Signed)
Result Notes for ECHOCARDIOGRAM COMPLETE   Notes recorded by Nelva Bush, MD on 10/16/2017 at 9:21 PM EST Please let Bridget Lane know that her echo shows that her heart is contracting well with minimal leakage of her valves. I recommend that we obtain and pharmacologic myocardial perfusion stress test to exclude obstructive CAD as a cause of her shortness of breath. She should continue her current medications.   Left message for patient to call back.

## 2017-10-20 NOTE — Telephone Encounter (Signed)
Informed patient of her echo results. Patient thinks she is back in A. FIB. Patient wants to know what the plan is for her since she is back in A. FIB. Informed patient that Dr. Saunders Revel was wanting to have her get a lexiscan myoview after reviewing her echo. Patient stated she wanted Dr. Saunders Revel to know she is back in A. FIB before she gets stress test. Will forward to Dr. Saunders Revel for advisement.

## 2017-10-20 NOTE — Telephone Encounter (Signed)
If she thinks she is back in a-fib, it is probably best for her to be seen in the a-fib clinic. We will proceed with Myoview, as previously recommended.  Nelva Bush, MD [facility] Pager: [phone]

## 2017-10-20 NOTE — Telephone Encounter (Signed)
Bridget Lane is calling to get results from her Echocardiogram done on 10/13/17. Please call

## 2017-10-20 NOTE — Telephone Encounter (Signed)
Called patient with Dr. Darnelle Bos recommendation. Made an appointment with A. FIB clinic for this Friday. Sent instructions for Myoview through [place]. Will send message to scheduling to call patient with [place] appointment. Patient verbalized understanding.

## 2017-10-22 ENCOUNTER — Other Ambulatory Visit: Payer: Self-pay

## 2017-10-22 MED ORDER — METOPROLOL TARTRATE 25 MG PO TABS: 12.5000 mg | ORAL_TABLET | Freq: Two times a day (BID) | ORAL | 8 refills | Status: DC

## 2017-10-23 ENCOUNTER — Other Ambulatory Visit: Payer: Self-pay | Admitting: Internal Medicine

## 2017-10-23 ENCOUNTER — Other Ambulatory Visit: Payer: Self-pay | Admitting: *Deleted

## 2017-10-23 ENCOUNTER — Telehealth (HOSPITAL_COMMUNITY): Payer: Self-pay | Admitting: Internal Medicine

## 2017-10-23 MED ORDER — FLECAINIDE ACETATE 100 MG PO TABS: ORAL_TABLET | ORAL | 3 refills | Status: DC

## 2017-10-23 NOTE — Telephone Encounter (Signed)
Pt's medication was sent to pt's pharmacy as requested. Confirmation received.  °

## 2017-10-24 ENCOUNTER — Encounter (HOSPITAL_COMMUNITY): Payer: Self-pay | Admitting: Nurse Practitioner

## 2017-10-24 ENCOUNTER — Telehealth (HOSPITAL_COMMUNITY): Payer: Self-pay | Admitting: *Deleted

## 2017-10-24 ENCOUNTER — Ambulatory Visit (HOSPITAL_COMMUNITY)
Admission: RE | Admit: 2017-10-24 | Discharge: 2017-10-24 | Disposition: A | Discharge status: Home / Self Care | Payer: Medicare Other | Source: Ambulatory Visit | Attending: Nurse Practitioner | Admitting: Nurse Practitioner

## 2017-10-24 VITALS — BP 142/60 | HR 50 | Ht 63.0 in | Wt 174.0 lb

## 2017-10-24 DIAGNOSIS — Z888 Allergy status to other drugs, medicaments and biological substances status: Secondary | ICD-10-CM | POA: Insufficient documentation

## 2017-10-24 DIAGNOSIS — I2510 Atherosclerotic heart disease of native coronary artery without angina pectoris: Secondary | ICD-10-CM | POA: Insufficient documentation

## 2017-10-24 DIAGNOSIS — Z808 Family history of malignant neoplasm of other organs or systems: Secondary | ICD-10-CM | POA: Diagnosis not present

## 2017-10-24 DIAGNOSIS — Z882 Allergy status to sulfonamides status: Secondary | ICD-10-CM | POA: Insufficient documentation

## 2017-10-24 DIAGNOSIS — Z9049 Acquired absence of other specified parts of digestive tract: Secondary | ICD-10-CM | POA: Diagnosis not present

## 2017-10-24 DIAGNOSIS — Z7901 Long term (current) use of anticoagulants: Secondary | ICD-10-CM | POA: Diagnosis not present

## 2017-10-24 DIAGNOSIS — Z803 Family history of malignant neoplasm of breast: Secondary | ICD-10-CM | POA: Insufficient documentation

## 2017-10-24 DIAGNOSIS — Z8261 Family history of arthritis: Secondary | ICD-10-CM | POA: Diagnosis not present

## 2017-10-24 DIAGNOSIS — H3530 Unspecified macular degeneration: Secondary | ICD-10-CM | POA: Diagnosis not present

## 2017-10-24 DIAGNOSIS — Z8349 Family history of other endocrine, nutritional and metabolic diseases: Secondary | ICD-10-CM | POA: Insufficient documentation

## 2017-10-24 DIAGNOSIS — Z880 Allergy status to penicillin: Secondary | ICD-10-CM | POA: Insufficient documentation

## 2017-10-24 DIAGNOSIS — Z8249 Family history of ischemic heart disease and other diseases of the circulatory system: Secondary | ICD-10-CM | POA: Diagnosis not present

## 2017-10-24 DIAGNOSIS — E785 Hyperlipidemia, unspecified: Secondary | ICD-10-CM | POA: Diagnosis not present

## 2017-10-24 DIAGNOSIS — K219 Gastro-esophageal reflux disease without esophagitis: Secondary | ICD-10-CM | POA: Diagnosis not present

## 2017-10-24 DIAGNOSIS — Z86010 Personal history of colonic polyps: Secondary | ICD-10-CM | POA: Diagnosis not present

## 2017-10-24 DIAGNOSIS — Z79899 Other long term (current) drug therapy: Secondary | ICD-10-CM | POA: Diagnosis not present

## 2017-10-24 DIAGNOSIS — Z981 Arthrodesis status: Secondary | ICD-10-CM | POA: Insufficient documentation

## 2017-10-24 DIAGNOSIS — Z833 Family history of diabetes mellitus: Secondary | ICD-10-CM | POA: Diagnosis not present

## 2017-10-24 DIAGNOSIS — Z885 Allergy status to narcotic agent status: Secondary | ICD-10-CM | POA: Insufficient documentation

## 2017-10-24 DIAGNOSIS — Z90710 Acquired absence of both cervix and uterus: Secondary | ICD-10-CM | POA: Insufficient documentation

## 2017-10-24 DIAGNOSIS — I480 Paroxysmal atrial fibrillation: Secondary | ICD-10-CM | POA: Diagnosis not present

## 2017-10-24 NOTE — Progress Notes (Addendum)
Primary Care Physician: [name], MD Referring Physician: Dr. Marney Doctor Bridget Lane is a 84 y.o. female with a h/o paroxysmal atrial fibrillation, since 2012-1013, on flecainide, hyperlipidemia, mild, nonobstructive CAD by catheterization in 2013, and GERD, who presented for follow-up with Dr. Saunders Revel 11/26 and found  to be in afib. She had noted  some shortness of breath over the last few months. She was asked to be evaluated in the afib clinc.  Today, the pt reports for  the last 7-10 days, she has been able to do her house hold duties such as vacuuming much easier and felt that her energy was better.EKG shows SR. She continues on flecainide 100 mg bid   She denies tobacco,alcohol,caffeine use. Denies snoring history. No changes were made for that visit and she was referred back to Dr. Saunders Revel to have stress testing and echo.  She is back in afib clinic, 10/24/17. She is confused why she is here. She is in SR. The notes in Epic say that pt told the nurse that scheduled her stress test that she was in afib and that is what prompted the appointment here. She thought she was coming for stress test today but that is scheduled Monday. She says it would have been hard for her to know she is in afib to tell the nurse as she is unaware hen she is in afib. She had her echo done 12/24 with normal EF.  Today, she denies symptoms of palpitations, chest pain, shortness of breath, orthopnea, PND, lower extremity edema, dizziness, presyncope, syncope, or neurologic sequela. The patient is tolerating medications without difficulties and is otherwise without complaint today.   Past Medical History:  Diagnosis Date  . Atrial fibrillation ([place])    persistent  . Colon polyp 2005  . Depression   . DJD (degenerative joint disease)   . Dysrhythmia   . Esophageal stricture   . GERD (gastroesophageal reflux disease)   . H. pylori infection   . Hiatal hernia   . Hyperlipidemia   . Macular degeneration   .  Nontoxic multinodular goiter   . OA (osteoarthritis)   . Postherpetic neuralgia at T3-T5 level 04/27/2011  . Rectal bleeding 07/06/2014   Hemorrhoid related in past.     Past Surgical History:  Procedure Laterality Date  . ANTERIOR CERVICAL DECOMP/DISCECTOMY FUSION N/A 05/29/2015   Procedure: ANTERIOR CERVICAL DECOMPRESSION/DISCECTOMY FUSION CERVICAL FOUR-FIVE,CERVICAL FIVE-SIX;  Surgeon: Kary Kos, MD;  Location: [place] [facility];  Service: Neurosurgery;  Laterality: N/A;  . APPENDECTOMY    . CARDIAC CATHETERIZATION    . CARDIOVERSION  08/29/2011   Procedure: CARDIOVERSION;  Surgeon: Bing Quarry, MD;  Location: [facility];  Service: Cardiovascular;  Laterality: N/A;  . CARDIOVERSION  11/22/2011   Procedure: CARDIOVERSION;  Surgeon: Coralyn Mark, MD;  Location: [place];  Service: Cardiovascular;  Laterality: N/A;  . CHOLECYSTECTOMY  1989  . COLONOSCOPY W/ POLYPECTOMY  2005   Neg in 2010; Dr Olevia Perches  . ESOPHAGEAL DILATION     > 3 X; Dr Olevia Perches  . LEFT AND RIGHT HEART CATHETERIZATION WITH CORONARY ANGIOGRAM N/A 12/02/2011   Procedure: LEFT AND RIGHT HEART CATHETERIZATION WITH CORONARY ANGIOGRAM;  Surgeon: Jolaine Artist, MD;  Location: [facility] CATH LAB;  Service: Cardiovascular;  Laterality: N/A;  . TOTAL ABDOMINAL HYSTERECTOMY  1972   for pain (no BSO)  . TUBAL LIGATION     with appendectomy  . UPPER GI ENDOSCOPY  2010   H pylori  Current Outpatient Medications  Medication Sig Dispense Refill  . docusate sodium (COLACE) 100 MG capsule Take 100 mg by mouth daily as needed for mild constipation.    Marland Kitchen ELIQUIS 5 MG TABS tablet TAKE 1 TABLET BY MOUTH TWICE DAILY 60 tablet 5  . flecainide (TAMBOCOR) 100 MG tablet TAKE 1 TABLET(100 MG) BY MOUTH TWICE DAILY 180 tablet 3  . metoprolol tartrate (LOPRESSOR) 25 MG tablet Take 0.5 tablets (12.5 mg total) by mouth 2 (two) times daily. 30 tablet 8  . Multiple Vitamin (MULTIVITAMIN) tablet Take 1 tablet by mouth daily.    Marland Kitchen omeprazole (PRILOSEC) 40 MG capsule  Take 1 capsule (40 mg total) by mouth daily. 90 capsule 3  . tiZANidine (ZANAFLEX) 4 MG tablet Take 4 mg by mouth 2 (two) times daily as needed.  0  . zolpidem (AMBIEN) 5 MG tablet TAKE 1 TABLET BY MOUTH AT BEDTIME AS NEEDED FOR SLEEP 30 tablet 5   No current facility-administered medications for this encounter.     Allergies  Allergen Reactions  . Penicillins Rash  . Codeine Other (See Comments)    hallucinations  . Acetaminophen Other (See Comments)    hurts stomach  . Dabigatran Etexilate Mesylate Other (See Comments)     All extremities feel heavy and hurt  . Sulfa Antibiotics Rash  . Sulfonamide Derivatives Rash    Social History   Socioeconomic History  . Marital status: Married    Spouse name: Not on file  . Number of children: 4  . Years of education: Not on file  . Highest education level: Not on file  Social Needs  . Financial resource strain: Not on file  . Food insecurity - worry: Not on file  . Food insecurity - inability: Not on file  . Transportation needs - medical: Not on file  . Transportation needs - non-medical: Not on file  Occupational History  . Occupation: retired  Tobacco Use  . Smoking status: Never Smoker  . Smokeless tobacco: Never Used  Substance and Sexual Activity  . Alcohol use: No  . Drug use: No  . Sexual activity: Not on file  Other Topics Concern  . Not on file  Social History Narrative   Married 1956. 4 children 2 boys 2 girls. 16 grandkids.  5 greatgrandkids.    Pt lives in [place] with spouse.        Retired from [employer]      Hobbies: travel, spend time with people, family time   Exercise-walking      No HCPOA-advised to do this.     Family History  Problem Relation Age of Onset  . Heart failure Mother   . Coronary artery disease Mother   . Diabetes Mother   . Colon cancer Father        in 43s  . Osteoarthritis Father   . Coronary artery disease Father   . Breast cancer Sister   .  Diabetes Sister   . Stroke Sister   . Coronary artery disease Brother   . Throat cancer Brother        smoked  . Breast cancer Maternal Aunt   . Hypothyroidism Sister     ROS- All systems are reviewed and negative except as per the HPI above  Physical Exam: Vitals:   10/24/17 1108  BP: (!) 142/60  Pulse: (!) 50  Weight: 174 lb (78.9 kg)  Height: 5\' 3"  (1.6 m)   Wt Readings from Last 3 Encounters:  10/24/17 174 lb (78.9 kg)  10/02/17 172 lb (78 kg)  09/15/17 170 lb (77.1 kg)    Labs: Lab Results  Component Value Date   NA 140 01/27/2017   K 4.1 01/27/2017   CL 106 01/27/2017   CO2 29 01/27/2017   GLUCOSE 87 01/27/2017   BUN 19 01/27/2017   CREATININE 0.94 01/27/2017   CALCIUM 9.4 01/27/2017   MG 1.8 09/08/2012   Lab Results  Component Value Date   INR 1.08 12/03/2011   Lab Results  Component Value Date   CHOL 214 (H) 11/01/2016   HDL 53.50 11/01/2016   LDLCALC 127 (H) 11/01/2016   TRIG 164.0 (H) 11/01/2016     GEN- The patient is well appearing, alert and oriented x 3 today.   Head- normocephalic, atraumatic Eyes-  Sclera clear, conjunctiva pink Ears- hearing intact Oropharynx- clear Neck- supple, no JVP Lymph- no cervical lymphadenopathy Lungs- Clear to ausculation bilaterally, normal work of breathing Heart- Regular rate and rhythm, no murmurs, rubs or gallops, PMI not laterally displaced GI- soft, NT, ND, + BS Extremities- no clubbing, cyanosis, or edema MS- no significant deformity or atrophy Skin- no rash or lesion Psych- euthymic mood, full affect Neuro- strength and sensation are intact  EKG-Sinus brady at 50 bpm, pr int 289ms, qrs int 106 ms, qtc 470 ms Epic records reviewed Echo-Study Conclusions 10/13/18  - Left ventricle: The cavity size was normal. Wall thickness was   normal. Systolic function was normal. The estimated ejection   fraction was in the range of 55% to 60%. Wall motion was normal;   there were no regional wall motion  abnormalities. - Aortic valve: There was trivial regurgitation. - Mitral valve: There was mild regurgitation. - Pulmonary arteries: PA peak pressure: 33 mm Hg (S).  Impressions:  - Normal LV systolic function; trace AI; mild MR and TR.   Assessment and Plan: 1. Paroxysmal afib Pt is in SR today and is confused why she is here, does not remember telling the nurse she was in afib, she does not usually recognize she is in afib Thought she was coming for a stress test Continue with plans for stress test on Monday No changes made today Continue flecainide 100 mg bid and may need future change of antiarrythmic if tests show CAD or has  significant increase in afib burden Continue eliquis 5 mg bid  She may also be a candidate for ablation but age may limit, but not entirely exclude ,her being an optimal candidate  F/u with Dr. Saunders Revel as planned  Afib clinic as needed  [place]. Lacora [place], [facility] [address] [place], [place] [address] [phone]

## 2017-10-24 NOTE — Telephone Encounter (Signed)
Patient given detailed instructions per Myocardial Perfusion Study Information Sheet for the test on 10/27/17 at 7:45. Patient notified to arrive 15 minutes early and that it is imperative to arrive on time for appointment to keep from having the test rescheduled.  If you need to cancel or reschedule your appointment, please call the office within 24 hours of your appointment. . Patient verbalized understanding.Bridget Lane

## 2017-10-24 NOTE — Addendum Note (Signed)
Encounter addended by: Sherran Needs, NP on: 10/24/2017 4:28 PM  Actions taken: Sign clinical note

## 2017-10-24 NOTE — Patient Instructions (Signed)
How to Prepare for Your Myoview Test:     A. Nothing to eat or drink 2 hours prior to arrival time, except you may drink water    B. No Caffeine/Decaffeinated products or chocolate 12 hours prior to arrival.    C. No Cologne or Lotion    D. Wear comfortable walking shoes    E. Total time is 3 to 4 hours; you may want to bring reading material for the waiting time. If someone comes      with you, they will need to remain in the lobby due to limited space in the testing area.    F. Please report to [address], [address] for your test.    G. Medication Instructions:   After You Arrive:   Once you arrive in the Nuclear Cardiology lab, an IV will be started, then the Technologist will inject a small amount of radioactive tracer. There will be a 1 hour waiting period after this injection. A series of pictures will be taken of your heart following this waiting period. You will be prepped for the stress portion of the test. During the stress portion of your test a small amount of radioactive tracer will be injected in your IV. After the stress portion, there is a short rest period during which time your heart and blood pressure will be monitored. After the short test period the Technologist will begin your second set of pictures. Your doctor will inform you of your test results within 7 days.   In preparation for your appointment, medication, and supplies will be purchased. Appointment availability is limited, so if you need to cancel or reschedule please call the Nuclear Department at [phone], 24 hours in advance to avoid a cancellation fee of $50.00

## 2017-10-27 ENCOUNTER — Ambulatory Visit (HOSPITAL_COMMUNITY): Payer: Medicare Other | Attending: Cardiology

## 2017-10-27 DIAGNOSIS — R0602 Shortness of breath: Secondary | ICD-10-CM | POA: Diagnosis not present

## 2017-10-27 LAB — MYOCARDIAL PERFUSION IMAGING
CHL CUP NUCLEAR SDS: 3
CHL CUP NUCLEAR SRS: 3
LHR: 0.32
LV dias vol: 128 mL (ref 46–106)
LVSYSVOL: 58 mL
Peak HR: 71 {beats}/min
Rest HR: 52 {beats}/min
SSS: 6
TID: 1.09

## 2017-10-27 MED ORDER — REGADENOSON 0.4 MG/5ML IV SOLN
0.4000 mg | Freq: Once | INTRAVENOUS | Status: AC
  Administered 2017-10-27: 0.4 mg via INTRAVENOUS

## 2017-10-27 MED ORDER — TECHNETIUM TC 99M TETROFOSMIN IV KIT
10.7000 | PACK | Freq: Once | INTRAVENOUS | Status: AC | PRN
  Administered 2017-10-27: 10.7 via INTRAVENOUS
  Filled 2017-10-27: qty 11

## 2017-10-27 MED ORDER — TECHNETIUM TC 99M TETROFOSMIN IV KIT
32.8000 | PACK | Freq: Once | INTRAVENOUS | Status: AC | PRN
  Administered 2017-10-27: 32.8 via INTRAVENOUS
  Filled 2017-10-27: qty 33

## 2017-10-27 NOTE — Telephone Encounter (Signed)
User: Cherie Dark A Date/time: 10/23/17 1:28 PM  Comment: Called pt and lmsg for her to CB to get scheduled for myoview..  Context:  Outcome: Left Message  Phone number: [phone] Phone Type: Home Phone  Comm. type: Telephone Call type: Outgoing  Contact: Rochel Brome S Relation to patient: Self

## 2017-10-29 ENCOUNTER — Telehealth: Payer: Self-pay | Admitting: Family Medicine

## 2017-10-29 ENCOUNTER — Telehealth: Payer: Self-pay | Admitting: Internal Medicine

## 2017-10-29 NOTE — Telephone Encounter (Signed)
Good advice- thanks for discussing with patient. Hard to tell what the issue is without evaluating

## 2017-10-29 NOTE — Telephone Encounter (Addendum)
Patient reports her L jaw has been out of socket for 3-4 months. It has caused her pain and discomfort. She has been to the dentist but "they don't seem too worried about it."  This AM, she woke up and the R side felt the same as the L. The pain radiates from her jaw up into her temple and ear. She took her muscle relaxer and it did relieve the pain some.  She has no other symptoms. Her stress test was low-risk.  Per the Nuclear Department, informed the patient that [place] and the tracer are far out of her system (the stress test was Monday). Since the symptoms started today, it is highly unlikely there is any cause and effect.  Advised the patient to call her PCP and discuss symptoms since they have been unaddressed for so long and now the symptoms are progressing.  She was grateful for call and agrees with treatment plan.

## 2017-10-29 NOTE — Telephone Encounter (Signed)
Called and spoke with patient whom I offered an appointment to but she refused. She denied arm or chest pain, She denied blurred vision. Her speech pattern is clear and easily understandable. She stated she thought maybe her jaw on that side was popping out of joint as it does this on the left side. I encouraged her if she won't follow up with [place] to call her dentist. She verbalized understanding and stated she would call back if it got worse.

## 2017-10-29 NOTE — Telephone Encounter (Signed)
Copied from [place] [id number]. Topic: Quick Communication - See Telephone Encounter >> Oct 29, 2017 11:28 AM Cleaster Corin, NT wrote: CRM for notification. See Telephone encounter for:   10/29/17. Pt. Calling to speak with Dr. Yong Channel or nurse cornering about pain in right face (temple down to neck) (left jaw out of joint) tried to have pt. Speak with nurse triage pt. Refused only wants to speak with Dr. Yong Channel or nurse pt. Can be reached at [phone]

## 2017-10-29 NOTE — Telephone Encounter (Signed)
I agree that symptoms are unlikely to be related to recent stress test.  Bridget Lane should speak with her PCP and/or dentist for further evaluation.  Nelva Bush, MD [facility] Pager: [phone]

## 2017-10-29 NOTE — Telephone Encounter (Signed)
Please call patient to advise

## 2017-10-29 NOTE — Telephone Encounter (Signed)
New message   Patient says that she had a stress test done and now she is experiencing pain on the right side of her head and into her right ear from the meds given for the test Please call

## 2017-11-17 ENCOUNTER — Ambulatory Visit: Payer: Self-pay | Admitting: *Deleted

## 2017-11-17 NOTE — Telephone Encounter (Signed)
See note

## 2017-11-17 NOTE — Telephone Encounter (Signed)
noted thanks  

## 2017-11-17 NOTE — Telephone Encounter (Signed)
Noted  

## 2017-11-17 NOTE — Telephone Encounter (Signed)
Patient has called for an appointment- and with her stated symptoms- the agent felt she needed triage- she has scheduled an appointment for tomorrow with Dr Yong Channel. Patient states she fell within the past year and she hit her head causing an undiagnosed concussion. She feels that she may be having lingering symptoms from that fall. She states she has been hearing " crinkling" sound in her ear and she did have a severe headache a few days ago that did scare her. She had pain the travel from her temple to her neck. She took aspirin and she has since improved. She states she has soreness to touch on the side where she fell and she wants to talk to Dr Yong Channel about the symptoms she had. She does not have any residual symptoms- no dizziness, numbness, weakness reported. She only wants to see Dr Yong Channel and the agent has scheduled her an appointment for tomorrow. Reviewed symptoms of stroke/heart attack with her and she is aware to call for help if anything should occur before her appointment. She is going to be out today taking her daughter to doctor appointments. Answer Assessment - Initial Assessment Questions 1. SYMPTOM: "What is the main symptom you are concerned about?" (e.g., weakness, numbness)     Patient has "crinkle" sound in her ear. Patient has been have dull headache- was sharp at one episode- she states were her concussion  2. ONSET: "When did this start?" (minutes, hours, days; while sleeping)     Last month 3. LAST NORMAL: "When was the last time you were normal (no symptoms)?"     Before concussion 4. PATTERN "Does this come and go, or has it been constant since it started?"  "Is it present now?"     Comes and goes- it was worse after episode of severe headache 5. CARDIAC SYMPTOMS: "Have you had any of the following symptoms: chest pain, difficulty breathing, palpitations?"     no 6. NEUROLOGIC SYMPTOMS: "Have you had any of the following symptoms: headache, dizziness, vision loss, double  vision, changes in speech, unsteady on your feet?"     Headache- dull pain 7. OTHER SYMPTOMS: "Do you have any other symptoms?"     Jaw feels out of joint on L 8. PREGNANCY: "Is there any chance you are pregnant?" "When was your last menstrual period?"     n/a  Protocols used: NEUROLOGIC DEFICIT-A-AH

## 2017-11-18 ENCOUNTER — Ambulatory Visit: Payer: Medicare Other | Admitting: Family Medicine

## 2017-11-19 DIAGNOSIS — Z85828 Personal history of other malignant neoplasm of skin: Secondary | ICD-10-CM | POA: Diagnosis not present

## 2017-11-19 DIAGNOSIS — L570 Actinic keratosis: Secondary | ICD-10-CM | POA: Diagnosis not present

## 2017-11-19 DIAGNOSIS — X32XXXD Exposure to sunlight, subsequent encounter: Secondary | ICD-10-CM | POA: Diagnosis not present

## 2017-11-19 DIAGNOSIS — Z08 Encounter for follow-up examination after completed treatment for malignant neoplasm: Secondary | ICD-10-CM | POA: Diagnosis not present

## 2017-11-24 ENCOUNTER — Other Ambulatory Visit: Payer: Self-pay | Admitting: *Deleted

## 2017-11-24 MED ORDER — APIXABAN 5 MG PO TABS: 5.0000 mg | ORAL_TABLET | Freq: Two times a day (BID) | ORAL | 0 refills | Status: DC

## 2017-11-28 ENCOUNTER — Telehealth: Payer: Self-pay | Admitting: Internal Medicine

## 2017-11-28 NOTE — Telephone Encounter (Signed)
Patient calling the office for samples of medication: ° ° °1.  What medication and dosage are you requesting samples for? °Eliquis 5 mg  °2.  Are you currently out of this medication?  °Yes ° ° ° °

## 2017-11-28 NOTE — Telephone Encounter (Signed)
Spoke with patient and she stated that she is asking for samples as she went to [facility] yesterday to pick up her refill and they stated that they never received an order from our office. Then last night she received a call from the mail order informing her that they had received an order from the office for a ninety day supply. She didn't request that it be sent to the mail order but she stated that she gave them to okay to process it and send it to her. She is out of medication and missed her AM dose today. She is aware that I will place one box of samples at the front desk. Patient verbalized her understanding and was very appreciative.

## 2017-12-24 DIAGNOSIS — M1812 Unilateral primary osteoarthritis of first carpometacarpal joint, left hand: Secondary | ICD-10-CM | POA: Diagnosis not present

## 2017-12-24 DIAGNOSIS — M1811 Unilateral primary osteoarthritis of first carpometacarpal joint, right hand: Secondary | ICD-10-CM | POA: Diagnosis not present

## 2018-01-02 DIAGNOSIS — H353132 Nonexudative age-related macular degeneration, bilateral, intermediate dry stage: Secondary | ICD-10-CM | POA: Diagnosis not present

## 2018-01-02 DIAGNOSIS — H43813 Vitreous degeneration, bilateral: Secondary | ICD-10-CM | POA: Diagnosis not present

## 2018-03-03 ENCOUNTER — Encounter: Payer: Self-pay | Admitting: Internal Medicine

## 2018-03-04 ENCOUNTER — Other Ambulatory Visit: Payer: Self-pay | Admitting: Internal Medicine

## 2018-03-04 DIAGNOSIS — I4891 Unspecified atrial fibrillation: Secondary | ICD-10-CM

## 2018-03-04 NOTE — Telephone Encounter (Signed)
Please review for refill, Thanks !  

## 2018-03-05 ENCOUNTER — Other Ambulatory Visit: Payer: Self-pay | Admitting: *Deleted

## 2018-03-06 ENCOUNTER — Other Ambulatory Visit: Payer: Medicare Other

## 2018-03-06 DIAGNOSIS — I4891 Unspecified atrial fibrillation: Secondary | ICD-10-CM

## 2018-03-06 LAB — BASIC METABOLIC PANEL
BUN/Creatinine Ratio: 25 (ref 12–28)
BUN: 27 mg/dL (ref 8–27)
CALCIUM: 9.6 mg/dL (ref 8.7–10.3)
CO2: 21 mmol/L (ref 20–29)
Chloride: 104 mmol/L (ref 96–106)
Creatinine, Ser: 1.07 mg/dL — ABNORMAL HIGH (ref 0.57–1.00)
GFR, EST AFRICAN AMERICAN: 56 mL/min/{1.73_m2} — AB (ref >59)
GFR, EST NON AFRICAN AMERICAN: 48 mL/min/{1.73_m2} — AB (ref >59)
Glucose: 73 mg/dL (ref 65–99)
POTASSIUM: 4.8 mmol/L (ref 3.5–5.2)
Sodium: 141 mmol/L (ref 134–144)

## 2018-03-06 LAB — CBC
HEMATOCRIT: 35.5 % (ref 34.0–46.6)
HEMOGLOBIN: 12.3 g/dL (ref 11.1–15.9)
MCH: 29.4 pg (ref 26.6–33.0)
MCHC: 34.6 g/dL (ref 31.5–35.7)
MCV: 85 fL (ref 79–97)
Platelets: 289 10*3/uL (ref 150–379)
RBC: 4.19 x10E6/uL (ref 3.77–5.28)
RDW: 14.2 % (ref 12.3–15.4)
WBC: 6.9 10*3/uL (ref 3.4–10.8)

## 2018-03-06 NOTE — Telephone Encounter (Signed)
Sent Eliquis 30 day Rx local for pt as she has taken her last Eliquis this morning.  Pt coming to have labs drawn this afternoon and will send in 90 day supply after labs are done.

## 2018-03-09 MED ORDER — APIXABAN 5 MG PO TABS: 5.0000 mg | ORAL_TABLET | Freq: Two times a day (BID) | ORAL | 3 refills | Status: DC

## 2018-03-09 NOTE — Telephone Encounter (Signed)
Eliquis 5mg  refill request received. Pt requested local refill sent & that was done, will send this refill to original requesting pharmacy.  Pt had lab drawn & resulted; pt is 84 yrs old, Crea-1.07 on 03/06/18, Wt-77.1kg, last seen by Roderic Palau on 10/24/17.

## 2018-03-23 ENCOUNTER — Ambulatory Visit: Payer: Medicare Other | Admitting: Internal Medicine

## 2018-03-23 ENCOUNTER — Encounter: Payer: Self-pay | Admitting: Internal Medicine

## 2018-03-23 VITALS — BP 120/72 | HR 56 | Ht 63.0 in | Wt 169.0 lb

## 2018-03-23 DIAGNOSIS — I481 Persistent atrial fibrillation: Secondary | ICD-10-CM

## 2018-03-23 DIAGNOSIS — I4819 Other persistent atrial fibrillation: Secondary | ICD-10-CM

## 2018-03-23 DIAGNOSIS — R0600 Dyspnea, unspecified: Secondary | ICD-10-CM

## 2018-03-23 DIAGNOSIS — R0609 Other forms of dyspnea: Secondary | ICD-10-CM | POA: Diagnosis not present

## 2018-03-23 NOTE — Progress Notes (Signed)
Follow-up Outpatient Visit Date: 03/23/2018  Primary Care Provider: [name], [place] [address]  Chief Complaint: Follow-up atrial fibrillation  HPI:  Bridget Lane is a 84 y.o. year-old female with history of paroxysmal atrial fibrillation, hyperlipidemia, mild, nonobstructive CAD by catheterization in 2013, and GERD, who presents for follow-up of atrial fibrillation.  I last saw her in November, 2018, at which time she complained of an uneasy feeling in her chest as well as increased exertional dyspnea.  She was concerned that she may have been having bouts of atrial fibrillation.  At our visit, she was found to be in atrial fibrillation, albeit with reasonable ventricular rate control.  Bridget Lane subsequently was seen in the a-fib clinic and underwent transthoracic echo and pharmacologic myocardial perfusion stress testing, which showed preserved LVEF and no evidence of significant ischemia.  She was last seen in the a-fib clinic in January, at which time she was in sinus rhythm.  She was continued on flecainide 100 mg BID.  Today, Bridget Lane reports feeling well other than being a bit down.  She has been having more stress at home due to her husband's heart issues.  She has not been exercising as often but up until recently was walking 30 min/day, 4x/week.  She sometimes notes dyspnea when walking up a steep hill, unchanged for years.  She denies chest pain, palpitations, and edema.  She remains compliant with apixaban, flecainide, and metoprolol.  She has occasional scant rectal bleeding that she attributes to hemorrhoids.  She has not had any falls or symptoms to suggest recurrence of atrial fibrillation.  --------------------------------------------------------------------------------------------------  Past Medical History:  Diagnosis Date  . Atrial fibrillation ([place])    persistent  . Colon polyp 2005  . Depression   . DJD (degenerative joint disease)    . Dysrhythmia   . Esophageal stricture   . GERD (gastroesophageal reflux disease)   . H. pylori infection   . Hiatal hernia   . Hyperlipidemia   . Macular degeneration   . Nontoxic multinodular goiter   . OA (osteoarthritis)   . Postherpetic neuralgia at T3-T5 level 04/27/2011  . Rectal bleeding 07/06/2014   Hemorrhoid related in past.     Past Surgical History:  Procedure Laterality Date  . ANTERIOR CERVICAL DECOMP/DISCECTOMY FUSION N/A 05/29/2015   Procedure: ANTERIOR CERVICAL DECOMPRESSION/DISCECTOMY FUSION CERVICAL FOUR-FIVE,CERVICAL FIVE-SIX;  Surgeon: Kary Kos, MD;  Location: [facility];  Service: Neurosurgery;  Laterality: N/A;  . APPENDECTOMY    . CARDIAC CATHETERIZATION    . CARDIOVERSION  08/29/2011   Procedure: CARDIOVERSION;  Surgeon: Bing Quarry, MD;  Location: [facility];  Service: Cardiovascular;  Laterality: N/A;  . CARDIOVERSION  11/22/2011   Procedure: CARDIOVERSION;  Surgeon: Coralyn Mark, MD;  Location: [facility];  Service: Cardiovascular;  Laterality: N/A;  . CHOLECYSTECTOMY  1989  . COLONOSCOPY W/ POLYPECTOMY  2005   Neg in 2010; Dr Olevia Perches  . ESOPHAGEAL DILATION     > 3 X; Dr Olevia Perches  . LEFT AND RIGHT HEART CATHETERIZATION WITH CORONARY ANGIOGRAM N/A 12/02/2011   Procedure: LEFT AND RIGHT HEART CATHETERIZATION WITH CORONARY ANGIOGRAM;  Surgeon: Jolaine Artist, MD;  Location: [facility];  Service: Cardiovascular;  Laterality: N/A;  . TOTAL ABDOMINAL HYSTERECTOMY  1972   for pain (no BSO)  . TUBAL LIGATION     with appendectomy  . UPPER GI ENDOSCOPY  2010   H pylori    Current Meds  Medication Sig  .  apixaban (ELIQUIS) 5 MG TABS tablet Take 1 tablet (5 mg total) by mouth 2 (two) times daily.  Marland Kitchen docusate sodium (COLACE) 100 MG capsule Take 100 mg by mouth daily as needed for mild constipation.  . flecainide (TAMBOCOR) 100 MG tablet TAKE 1 TABLET(100 MG) BY MOUTH TWICE DAILY  . metoprolol tartrate (LOPRESSOR) 25 MG tablet Take 0.5 tablets (12.5 mg total) by  mouth 2 (two) times daily.  . Multiple Vitamin (MULTIVITAMIN) tablet Take 1 tablet by mouth daily.  Marland Kitchen tiZANidine (ZANAFLEX) 4 MG tablet Take 4 mg by mouth 2 (two) times daily as needed.  . [DISCONTINUED] ELIQUIS 5 MG TABS tablet TAKE 1 TABLET BY MOUTH TWICE DAILY    Allergies: Penicillins; Codeine; Acetaminophen; Dabigatran etexilate mesylate; Sulfa antibiotics; and Sulfonamide derivatives  Social History   Tobacco Use  . Smoking status: Never Smoker  . Smokeless tobacco: Never Used  Substance Use Topics  . Alcohol use: No  . Drug use: No    Family History  Problem Relation Age of Onset  . Heart failure Mother   . Coronary artery disease Mother   . Diabetes Mother   . Colon cancer Father        in 38s  . Osteoarthritis Father   . Coronary artery disease Father   . Breast cancer Sister   . Diabetes Sister   . Stroke Sister   . Coronary artery disease Brother   . Throat cancer Brother        smoked  . Breast cancer Maternal Aunt   . Hypothyroidism Sister     Review of Systems: A 12-system review of systems was performed and was negative except as noted in the HPI.  --------------------------------------------------------------------------------------------------  Physical Exam: BP 120/72   Pulse (!) 56   Ht 5\' 3"  (1.6 m)   Wt 169 lb (76.7 kg)   SpO2 97%   BMI 29.94 kg/m   General:  NAD HEENT: No conjunctival pallor or scleral icterus. Moist mucous membranes.  OP clear. Neck: Supple without lymphadenopathy, thyromegaly, JVD, or HJR. Lungs: Normal work of breathing. Clear to auscultation bilaterally without wheezes or crackles. Heart: Bradycardic but regular without murmurs, rubs, or gallops. Non-displaced PMI. Abd: Bowel sounds present. Soft, NT/ND without hepatosplenomegaly Ext: No lower extremity edema. Radial, PT, and DP pulses are 2+ bilaterally. Skin: Warm and dry without rash.  EKG:  Sinus bradycardia (HR 56 bpm) without significant abnormalities.  Lab  Results  Component Value Date   WBC 6.9 03/06/2018   HGB 12.3 03/06/2018   HCT 35.5 03/06/2018   MCV 85 03/06/2018   PLT 289 03/06/2018    Lab Results  Component Value Date   NA 141 03/06/2018   K 4.8 03/06/2018   CL 104 03/06/2018   CO2 21 03/06/2018   BUN 27 03/06/2018   CREATININE 1.07 (H) 03/06/2018   GLUCOSE 73 03/06/2018   ALT 15 01/27/2017    Lab Results  Component Value Date   CHOL 214 (H) 11/01/2016   HDL 53.50 11/01/2016   LDLCALC 127 (H) 11/01/2016   TRIG 164.0 (H) 11/01/2016   CHOLHDL 4 11/01/2016    --------------------------------------------------------------------------------------------------  ASSESSMENT AND PLAN: Persistent atrial fibrillation Bridget Lane is maintaining sinus rhythm without symptoms of recurrence.  Given normal LVEF and low-risk Myoview earlier this year, we will continue with current regimen of flecainide, metoprolol, and apixaban (CHADSVASC score = 3).  Dyspnea on exertion Longstanding and improved from last visit in 08/2017, at which time Bridget Lane was in atrial  fibrillation.  Echo and Myoview were reassuring.  No further testing or intervention at this time.  Follow-up: Return to clinic in 6 months.  Nelva Bush, MD 03/23/2018 2:51 PM

## 2018-03-23 NOTE — Patient Instructions (Addendum)
Medication Instructions:  Your physician recommends that you continue on your current medications as directed. Please refer to the Current Medication list given to you today.  -- If you need a refill on your cardiac medications before your next appointment, please call your pharmacy. --  Labwork: None ordered  Testing/Procedures: None ordered  Follow-Up: Your physician wants you to follow-up in: 6 months with Dr. End.    You will receive a reminder letter in the mail two months in advance. If you don't receive a letter, please call our office to schedule the follow-up appointment.  Thank you for choosing [facility]!!    Any Other Special Instructions Will Be Listed Below (If Applicable).         

## 2018-04-02 ENCOUNTER — Other Ambulatory Visit: Payer: Self-pay | Admitting: Internal Medicine

## 2018-04-02 NOTE — Telephone Encounter (Signed)
Please review for refill, Thanks !  

## 2018-04-06 NOTE — Telephone Encounter (Signed)
You are correct, I just have to make you aware when they reach the "high risk" category.  Thank you!

## 2018-04-06 NOTE — Telephone Encounter (Signed)
Hi Mandi,  I seem to recall the Eliquis dosing is based on absolute creatinine, not GFR (Xarelto is dosed based on a GFR cutoff of 50).  Given that her creatinine is less than 1.5 and her weight greater than 60 kg, she still qualifies for 5 mg BID.  If I am incorrect, please let me know.  Thanks.  Gerald Stabs

## 2018-04-06 NOTE — Telephone Encounter (Signed)
Dr. Saunders Revel, Pt is 84 y/o, wt 76.7 kg, serum creatinine 1.07, CrCl 49.08. She is considered high risk on the Eliquis 5 mg since she is >75 and her CrCl <50. Would you like to reduce her dosage down to 2.5 mg BID or remain on the 5 mg? -[facility]

## 2018-04-27 ENCOUNTER — Telehealth: Payer: Self-pay | Admitting: Internal Medicine

## 2018-04-27 NOTE — Telephone Encounter (Signed)
New Message:       Pt c/o of Chest Pain: STAT if CP now or developed within 24 hours  1. Are you having CP right now? yes  2. Are you experiencing any other symptoms (ex. SOB, nausea, vomiting, sweating)? no  3. How long have you been experiencing CP? Over a week maybe 10 days  4. Is your CP continuous or coming and going? continuous  5. Have you taken Nitroglycerin? No ?  Pt states she is have cp in the left side of her breast and states she is having a lot of discomfort. Pt states she feels like something is moving around all of the time. Pt states it's not an emergency but would like to speak to a RN sometime today. Pt states if it not an issue with her heart can we refer her to someone to deal with this issue. Pt states it may be reflux because she feels like she has to burp all the time.

## 2018-04-27 NOTE — Telephone Encounter (Signed)
Called patient back. Patient complaining of chest pain that has been going on for 10 days and SOB with activity. Patient stated the chest pain is sharp at times, and she is afraid to do her regular walking due to making it worse. Informed patient that she needs to be evaluated. Recommend patient to go to ED to be evaluated, since she is having Chest pain with history of A. FIB. Patient is takes Eliquis. Patient stated she would like to check with her PCP first that this might be related to her stomach issues. Encouraged patient to call her PCP, and go to the ED for her CP. Patient verbalized understanding. Will send message to Dr. Saunders Revel, so he is aware.

## 2018-04-27 NOTE — Telephone Encounter (Signed)
Called patient back to let her know Dr. Darnelle Bos advisement. Patient verbalized understanding and she has not decided what she wants to do.

## 2018-04-27 NOTE — Telephone Encounter (Signed)
Given ongoing symptoms, I agree with further evaluation in the ED.  Nelva Bush, MD [facility] Pager: [phone]

## 2018-04-30 DIAGNOSIS — H5711 Ocular pain, right eye: Secondary | ICD-10-CM | POA: Diagnosis not present

## 2018-04-30 DIAGNOSIS — H02843 Edema of right eye, unspecified eyelid: Secondary | ICD-10-CM | POA: Diagnosis not present

## 2018-04-30 DIAGNOSIS — H00012 Hordeolum externum right lower eyelid: Secondary | ICD-10-CM | POA: Diagnosis not present

## 2018-05-11 ENCOUNTER — Other Ambulatory Visit: Payer: Self-pay | Admitting: Family Medicine

## 2018-05-11 ENCOUNTER — Other Ambulatory Visit: Payer: Self-pay | Admitting: Internal Medicine

## 2018-05-11 ENCOUNTER — Encounter: Payer: Self-pay | Admitting: Family Medicine

## 2018-05-11 ENCOUNTER — Ambulatory Visit: Payer: Medicare Other | Admitting: Family Medicine

## 2018-05-11 VITALS — BP 116/68 | HR 57 | Temp 98.3°F | Ht 63.0 in | Wt 171.0 lb

## 2018-05-11 DIAGNOSIS — R1012 Left upper quadrant pain: Secondary | ICD-10-CM | POA: Diagnosis not present

## 2018-05-11 DIAGNOSIS — F325 Major depressive disorder, single episode, in full remission: Secondary | ICD-10-CM | POA: Diagnosis not present

## 2018-05-11 DIAGNOSIS — Z780 Asymptomatic menopausal state: Secondary | ICD-10-CM

## 2018-05-11 MED ORDER — OMEPRAZOLE 40 MG PO CPDR: 40.0000 mg | DELAYED_RELEASE_CAPSULE | Freq: Two times a day (BID) | ORAL | 1 refills | Status: DC

## 2018-05-11 NOTE — Progress Notes (Signed)
Subjective:  Bridget Lane is a 84 y.o. year old very pleasant female patient who presents for/with See problem oriented charting ROS- left sided chest pain without shortness of breath, dizziness, left arm or neck pain, cough, congestion.    Past Medical History-  Patient Active Problem List   Diagnosis Date Noted  . Atrial fibrillation 06/14/2011    Priority: High  . Insomnia 08/25/2015    Priority: Medium  . Spinal stenosis in cervical region 05/29/2015    Priority: Medium  . Hyperglycemia 06/07/2014    Priority: Medium  . Hyperlipidemia 01/28/2008    Priority: Medium  . Cervical lymphadenopathy 05/16/2016    Priority: Low  . History of Helicobacter pylori infection 07/06/2014    Priority: Low  . OSA (obstructive sleep apnea) 02/11/2012    Priority: Low  . Long term (current) use of anticoagulants 07/19/2011    Priority: Low  . Chest pain 06/14/2011    Priority: Low  . Depression 01/30/2010    Priority: Low  . History of colonic polyps 01/30/2010    Priority: Low  . ESOPHAGEAL STRICTURE 07/20/2008    Priority: Low  . Osteoarthritis 07/20/2008    Priority: Low  . Multiple thyroid nodules 01/28/2008    Priority: Low  . GERD 01/28/2008    Priority: Low  . Dyspnea on exertion 03/23/2018  . Constipation 08/29/2017    Medications- reviewed and updated Current Outpatient Medications  Medication Sig Dispense Refill  . apixaban (ELIQUIS) 5 MG TABS tablet Take 1 tablet (5 mg total) by mouth 2 (two) times daily. 180 tablet 3  . docusate sodium (COLACE) 100 MG capsule Take 100 mg by mouth daily as needed for mild constipation.    Marland Kitchen ELIQUIS 5 MG TABS tablet TAKE 1 TABLET BY MOUTH TWICE DAILY 60 tablet 0  . flecainide (TAMBOCOR) 100 MG tablet TAKE 1 TABLET(100 MG) BY MOUTH TWICE DAILY 180 tablet 3  . metoprolol tartrate (LOPRESSOR) 25 MG tablet Take 0.5 tablets (12.5 mg total) by mouth 2 (two) times daily. 30 tablet 8  . Multiple Vitamin (MULTIVITAMIN) tablet Take 1 tablet by  mouth daily.    Marland Kitchen tiZANidine (ZANAFLEX) 4 MG tablet Take 4 mg by mouth 2 (two) times daily as needed.  0   No current facility-administered medications for this visit.     Objective: BP 116/68 (BP Location: Left Arm, Patient Position: Sitting, Cuff Size: Normal)   Pulse (!) 57   Temp 98.3 F (36.8 C) (Oral)   Ht 5\' 3"  (1.6 m)   Wt 171 lb (77.6 kg)   SpO2 97%   BMI 30.29 kg/m  Gen: NAD, resting comfortably CV: RRR no murmurs rubs or gallops Lungs: CTAB no crackles, wheeze, rhonchi Abdomen: soft/some epigastric tenderness though mild on exam/nondistended/normal bowel sounds. No rebound or guarding.  Ext: no edema Skin: warm, dry  EKG: sinus rhytm with first degree av block, rate 61, normal axis, normal intervals other than PR, no st or t wave changes, poor r wave progression noted but this appears similar to prior EKGs- no significant change from EKG 03/23/18  Assessment/Plan:   Left upper quadrant pain - Plan: EKG 12-Lead, CBC, Comprehensive metabolic panel, Lipase S:  for at least a month she has had some pain from sternum to under left breast- seems to happen all the time. Also burping fair amount. All of this seems to be worse recently. She is taking omeprazole 40mg  once a day. Back in 2015 had been on twice a day.  She states she had been off her omeprazole for a week about 3 months ago.   No increase in baseline shortness of breath. No exertional chest pain. No abnormal sweating or dizziness. No left arm or neck pain.  Pain is not worse with food.   Patient with low risk myoview 10/27/17  She wanted to make sure she was not in a fib.  A/P: 84 year old female with nonexertional chest pain not relieved by rest with recent low risk stress myoview 10/2017 followed by cardiology for a fib. Suspect low probability cardiac. get lipase though doubt low level pancreatitis. She is not in a fib today- her big concern. Doesn't really have pinpoint pain so not sure MSK in origin. Will get  labs to evalute further- has to come back in AM. In addition will increase to omeprazole 40mg  twice a day. If she is not noting improvement by next week asked her to call cardiology.   Depression S:Untreated. DId not tolerate SSRI. A/P: fortunately appears she is in remission even without meds with phq9 of 1.    Future Appointments  Date Time Provider [place]  05/12/2018 10:30 AM [facility] LAB [facility] PEC  05/14/2018 11:30 AM [facility] DEXA 1 [facility]   Lab/Order associations: Left upper quadrant pain - Plan: EKG 12-Lead, CBC, Comprehensive metabolic panel, Lipase  Major depressive disorder with single episode, in full remission ([place])  Postmenopausal - Plan: DG Bone Density  Meds ordered this encounter  Medications  . omeprazole (PRILOSEC) 40 MG capsule    Sig: Take 1 capsule (40 mg total) by mouth 2 (two) times daily.    Dispense:  60 capsule    Refill:  1   Return precautions advised.  Garret Reddish, MD

## 2018-05-11 NOTE — Assessment & Plan Note (Signed)
S:Untreated. DId not tolerate SSRI. A/P: fortunately appears she is in remission even without meds with phq9 of 1.

## 2018-05-11 NOTE — Patient Instructions (Addendum)
Health Maintenance Due  Topic Date Due  . DEXA SCAN - Schedule your bone density test at check out desk. You may also call directly to X-ray at [phone] to schedule an appointment that is convenient for you.  - located [address] across the street from [place] - in the basement - you do need an appointment for the bone density tests.  05/01/2000  . I would also like for you to sign up for an annual wellness visit with one of our nurses, Cassie or Manuela Schwartz, who both specialize in the annual wellness visit. This is a free benefit under medicare that may help [place] find additional ways to help you. Some highlights are reviewing medications, lifestyle, and doing a dementia screen.   . Please check with your pharmacy to see if they have the shingrix vaccine. If they do- please get this immunization and update [place] by phone call or mychart with dates you receive the vaccine    Please schedule a lab visit for tomorrow before you leave  You are not in atrial fibrillation today  Lets try omeprazole twice a day for at least a week. If you are not making any improvement on this I want you to call Dr. ENd for a follow up and his opinion. I want you to see him immediately if worsening symptoms such as chest pain, shortness of breath, dizziness, left arm or neck pain

## 2018-05-12 ENCOUNTER — Other Ambulatory Visit (INDEPENDENT_AMBULATORY_CARE_PROVIDER_SITE_OTHER): Payer: Medicare Other

## 2018-05-12 DIAGNOSIS — R1012 Left upper quadrant pain: Secondary | ICD-10-CM | POA: Diagnosis not present

## 2018-05-12 LAB — COMPREHENSIVE METABOLIC PANEL
ALBUMIN: 4.1 g/dL (ref 3.5–5.2)
ALT: 16 U/L (ref 0–35)
AST: 16 U/L (ref 0–37)
Alkaline Phosphatase: 74 U/L (ref 39–117)
BUN: 17 mg/dL (ref 6–23)
CO2: 28 mEq/L (ref 19–32)
Calcium: 9.3 mg/dL (ref 8.4–10.5)
Chloride: 105 mEq/L (ref 96–112)
Creatinine, Ser: 1.07 mg/dL (ref 0.40–1.20)
GFR: 52.05 mL/min — ABNORMAL LOW (ref >60.00)
Glucose, Bld: 100 mg/dL — ABNORMAL HIGH (ref 70–99)
Potassium: 4.4 mEq/L (ref 3.5–5.1)
SODIUM: 141 meq/L (ref 135–145)
Total Bilirubin: 0.5 mg/dL (ref 0.2–1.2)
Total Protein: 6.9 g/dL (ref 6.0–8.3)

## 2018-05-12 LAB — CBC
HCT: 37.7 % (ref 36.0–46.0)
Hemoglobin: 12.5 g/dL (ref 12.0–15.0)
MCHC: 33.2 g/dL (ref 30.0–36.0)
MCV: 88.6 fl (ref 78.0–100.0)
Platelets: 297 10*3/uL (ref 150.0–400.0)
RBC: 4.26 Mil/uL (ref 3.87–5.11)
RDW: 13.9 % (ref 11.5–15.5)
WBC: 6.1 10*3/uL (ref 4.0–10.5)

## 2018-05-12 LAB — LIPASE: Lipase: 39 U/L (ref 11.0–59.0)

## 2018-05-14 ENCOUNTER — Ambulatory Visit (INDEPENDENT_AMBULATORY_CARE_PROVIDER_SITE_OTHER)
Admission: RE | Admit: 2018-05-14 | Discharge: 2018-05-14 | Disposition: A | Discharge status: Home / Self Care | Payer: Medicare Other | Source: Ambulatory Visit | Attending: Family Medicine | Admitting: Family Medicine

## 2018-05-14 DIAGNOSIS — Z780 Asymptomatic menopausal state: Secondary | ICD-10-CM | POA: Diagnosis not present

## 2018-05-18 ENCOUNTER — Other Ambulatory Visit: Payer: Self-pay

## 2018-05-18 ENCOUNTER — Telehealth: Payer: Self-pay

## 2018-05-18 MED ORDER — ALENDRONATE SODIUM 70 MG PO TABS: 70.0000 mg | ORAL_TABLET | ORAL | 11 refills | Status: DC

## 2018-05-18 NOTE — Telephone Encounter (Signed)
Copied from [place] [phone]. Topic: General - Other >> May 18, 2018 10:50 AM [name] L wrote: Reason for CRM: Patient wants to know if Dr. Yong Channel is going to start her on a supplement for osteoporosis? She thinks the medication is called Fosamax.  Fosamax sent in to pharmacy as requested

## 2018-05-20 DIAGNOSIS — Z01419 Encounter for gynecological examination (general) (routine) without abnormal findings: Secondary | ICD-10-CM | POA: Diagnosis not present

## 2018-05-20 DIAGNOSIS — Z1231 Encounter for screening mammogram for malignant neoplasm of breast: Secondary | ICD-10-CM | POA: Diagnosis not present

## 2018-05-21 DIAGNOSIS — H0012 Chalazion right lower eyelid: Secondary | ICD-10-CM | POA: Diagnosis not present

## 2018-05-25 ENCOUNTER — Other Ambulatory Visit: Payer: Self-pay | Admitting: Obstetrics and Gynecology

## 2018-05-25 DIAGNOSIS — R928 Other abnormal and inconclusive findings on diagnostic imaging of breast: Secondary | ICD-10-CM

## 2018-05-28 ENCOUNTER — Ambulatory Visit
Admission: RE | Admit: 2018-05-28 | Discharge: 2018-05-28 | Disposition: A | Discharge status: Home / Self Care | Payer: Medicare Other | Source: Ambulatory Visit | Attending: Obstetrics and Gynecology | Admitting: Obstetrics and Gynecology

## 2018-05-28 ENCOUNTER — Other Ambulatory Visit: Payer: Self-pay | Admitting: Obstetrics and Gynecology

## 2018-05-28 DIAGNOSIS — R921 Mammographic calcification found on diagnostic imaging of breast: Secondary | ICD-10-CM | POA: Diagnosis not present

## 2018-05-28 DIAGNOSIS — R928 Other abnormal and inconclusive findings on diagnostic imaging of breast: Secondary | ICD-10-CM

## 2018-05-29 ENCOUNTER — Ambulatory Visit
Admission: RE | Admit: 2018-05-29 | Discharge: 2018-05-29 | Disposition: A | Discharge status: Home / Self Care | Payer: Medicare Other | Source: Ambulatory Visit | Attending: Obstetrics and Gynecology | Admitting: Obstetrics and Gynecology

## 2018-05-29 ENCOUNTER — Other Ambulatory Visit: Payer: Self-pay | Admitting: Obstetrics and Gynecology

## 2018-05-29 DIAGNOSIS — R921 Mammographic calcification found on diagnostic imaging of breast: Secondary | ICD-10-CM

## 2018-05-29 DIAGNOSIS — C50812 Malignant neoplasm of overlapping sites of left female breast: Secondary | ICD-10-CM | POA: Diagnosis not present

## 2018-06-01 ENCOUNTER — Other Ambulatory Visit: Payer: Self-pay | Admitting: Obstetrics and Gynecology

## 2018-06-01 DIAGNOSIS — C50912 Malignant neoplasm of unspecified site of left female breast: Secondary | ICD-10-CM

## 2018-06-02 ENCOUNTER — Telehealth: Payer: Self-pay | Admitting: Hematology and Oncology

## 2018-06-02 NOTE — Telephone Encounter (Signed)
Spoke with patient to confirm morning [facility] appointment for 8/21, packet will be mailed to patient

## 2018-06-03 ENCOUNTER — Telehealth: Payer: Self-pay | Admitting: *Deleted

## 2018-06-03 ENCOUNTER — Encounter: Payer: Self-pay | Admitting: *Deleted

## 2018-06-03 DIAGNOSIS — C50512 Malignant neoplasm of lower-outer quadrant of left female breast: Secondary | ICD-10-CM | POA: Insufficient documentation

## 2018-06-03 DIAGNOSIS — Z170 Estrogen receptor positive status [ER+]: Secondary | ICD-10-CM

## 2018-06-03 NOTE — Telephone Encounter (Signed)
Copied from [place] [id number]. Topic: General - Other >> Jun 03, 2018  2:52 PM Vernona Rieger wrote: Reason for CRM: Patient said she was on the phone with Dr Yong Channel and he phone cut off and said to please have Dr Yong Channel call her back if needed. [phone]

## 2018-06-03 NOTE — Telephone Encounter (Signed)
Please see message. °

## 2018-06-03 NOTE — Telephone Encounter (Signed)
I spoke with patient. I was touching base with her about the breast issues/breast center follow up

## 2018-06-04 ENCOUNTER — Other Ambulatory Visit: Payer: Self-pay | Admitting: Obstetrics and Gynecology

## 2018-06-04 ENCOUNTER — Ambulatory Visit
Admission: RE | Admit: 2018-06-04 | Discharge: 2018-06-04 | Disposition: A | Discharge status: Home / Self Care | Payer: Medicare Other | Source: Ambulatory Visit | Attending: Obstetrics and Gynecology | Admitting: Obstetrics and Gynecology

## 2018-06-04 DIAGNOSIS — C50912 Malignant neoplasm of unspecified site of left female breast: Secondary | ICD-10-CM

## 2018-06-04 DIAGNOSIS — N6489 Other specified disorders of breast: Secondary | ICD-10-CM | POA: Diagnosis not present

## 2018-06-05 DIAGNOSIS — T148XXA Other injury of unspecified body region, initial encounter: Secondary | ICD-10-CM | POA: Diagnosis not present

## 2018-06-05 DIAGNOSIS — L820 Inflamed seborrheic keratosis: Secondary | ICD-10-CM | POA: Diagnosis not present

## 2018-06-10 ENCOUNTER — Inpatient Hospital Stay: Payer: Medicare Other

## 2018-06-10 ENCOUNTER — Encounter: Payer: Self-pay | Admitting: Hematology and Oncology

## 2018-06-10 ENCOUNTER — Ambulatory Visit
Admission: RE | Admit: 2018-06-10 | Discharge: 2018-06-10 | Disposition: A | Discharge status: Home / Self Care | Payer: Medicare Other | Source: Ambulatory Visit | Attending: Radiation Oncology | Admitting: Radiation Oncology

## 2018-06-10 ENCOUNTER — Inpatient Hospital Stay: Payer: Medicare Other | Attending: Hematology and Oncology | Admitting: Hematology and Oncology

## 2018-06-10 ENCOUNTER — Ambulatory Visit: Payer: Self-pay | Admitting: Surgery

## 2018-06-10 ENCOUNTER — Ambulatory Visit: Payer: Medicare Other | Admitting: Physical Therapy

## 2018-06-10 ENCOUNTER — Encounter: Payer: Self-pay | Admitting: General Practice

## 2018-06-10 DIAGNOSIS — Z803 Family history of malignant neoplasm of breast: Secondary | ICD-10-CM | POA: Diagnosis not present

## 2018-06-10 DIAGNOSIS — Z171 Estrogen receptor negative status [ER-]: Principal | ICD-10-CM

## 2018-06-10 DIAGNOSIS — Z170 Estrogen receptor positive status [ER+]: Secondary | ICD-10-CM

## 2018-06-10 DIAGNOSIS — C50912 Malignant neoplasm of unspecified site of left female breast: Secondary | ICD-10-CM

## 2018-06-10 DIAGNOSIS — C50512 Malignant neoplasm of lower-outer quadrant of left female breast: Secondary | ICD-10-CM | POA: Diagnosis not present

## 2018-06-10 DIAGNOSIS — Z7901 Long term (current) use of anticoagulants: Secondary | ICD-10-CM | POA: Diagnosis not present

## 2018-06-10 DIAGNOSIS — M810 Age-related osteoporosis without current pathological fracture: Secondary | ICD-10-CM | POA: Diagnosis not present

## 2018-06-10 DIAGNOSIS — Z79811 Long term (current) use of aromatase inhibitors: Secondary | ICD-10-CM

## 2018-06-10 DIAGNOSIS — Z79899 Other long term (current) drug therapy: Secondary | ICD-10-CM | POA: Diagnosis not present

## 2018-06-10 DIAGNOSIS — I4891 Unspecified atrial fibrillation: Secondary | ICD-10-CM

## 2018-06-10 DIAGNOSIS — Z809 Family history of malignant neoplasm, unspecified: Secondary | ICD-10-CM | POA: Diagnosis not present

## 2018-06-10 LAB — CBC WITH DIFFERENTIAL (CANCER CENTER ONLY)
BASOS ABS: 0.1 10*3/uL (ref 0.0–0.1)
BASOS PCT: 1 %
EOS ABS: 0.1 10*3/uL (ref 0.0–0.5)
Eosinophils Relative: 2 %
HCT: 37 % (ref 34.8–46.6)
HEMOGLOBIN: 12.3 g/dL (ref 11.6–15.9)
Lymphocytes Relative: 32 %
Lymphs Abs: 2 10*3/uL (ref 0.9–3.3)
MCH: 29.6 pg (ref 25.1–34.0)
MCHC: 33.3 g/dL (ref 31.5–36.0)
MCV: 88.9 fL (ref 79.5–101.0)
MONOS PCT: 8 %
Monocytes Absolute: 0.5 10*3/uL (ref 0.1–0.9)
NEUTROS ABS: 3.7 10*3/uL (ref 1.5–6.5)
NEUTROS PCT: 57 %
Platelet Count: 261 10*3/uL (ref 145–400)
RBC: 4.17 MIL/uL (ref 3.70–5.45)
RDW: 13.9 % (ref 11.2–14.5)
WBC: 6.4 10*3/uL (ref 3.9–10.3)

## 2018-06-10 LAB — CMP (CANCER CENTER ONLY)
ALBUMIN: 3.9 g/dL (ref 3.5–5.0)
ALK PHOS: 86 U/L (ref 38–126)
ALT: 15 U/L (ref 0–44)
AST: 19 U/L (ref 15–41)
Anion gap: 10 (ref 5–15)
BUN: 21 mg/dL (ref 8–23)
CALCIUM: 9.1 mg/dL (ref 8.9–10.3)
CHLORIDE: 108 mmol/L (ref 98–111)
CO2: 25 mmol/L (ref 22–32)
CREATININE: 0.95 mg/dL (ref 0.44–1.00)
GFR, Est AFR Am: >60 mL/min (ref >60)
GFR, Estimated: 54 mL/min — ABNORMAL LOW (ref >60)
GLUCOSE: 79 mg/dL (ref 70–99)
Potassium: 4.1 mmol/L (ref 3.5–5.1)
SODIUM: 143 mmol/L (ref 135–145)
Total Bilirubin: 0.7 mg/dL (ref 0.3–1.2)
Total Protein: 7.3 g/dL (ref 6.5–8.1)

## 2018-06-10 NOTE — Assessment & Plan Note (Signed)
05/29/2018:Screening detected left breast calcifications 7 mm size axilla negative biopsy revealed grade 1 invasive ductal carcinoma with calcifications ER 100%, PR 90%, Ki-67 2%, HER-2 negative, T1b N0 stage Ia AJCC 8  Pathology and radiology counseling: Discussed with the patient, the details of pathology including the type of breast cancer,the clinical staging, the significance of ER, PR and HER-2/neu receptors and the implications for treatment. After reviewing the pathology in detail, we proceeded to discuss the different treatment options between surgery, radiation, chemotherapy, antiestrogen therapies.  Plan: 1.  Breast conserving surgery 2. followed by adjuvant antiestrogen therapy with letrozole 2.5 mg daily x5 years.  Radiation oncology discussed the pros and cons of radiation and determined that based on favorable prognostic features that she would not need adjuvant radiation.  Patient has had a diagnosis of osteoporosis but the T score was -2.1.  She is currently on calcium vitamin D along with bone density evaluations every 2 years.  Return to clinic after surgery to discuss pathology report and to initiate antiestrogen therapy.

## 2018-06-10 NOTE — Progress Notes (Signed)
Radiation Oncology         ([phone]) [phone] ________________________________  Name: Bridget Lane        MRN: [id number]  Date of Service: 06/10/2018 DOB: 1933-01-21  PT:[id number], Bridget Mars, MD  Erroll Luna, MD     REFERRING PHYSICIAN: Erroll Luna, MD   DIAGNOSIS: The encounter diagnosis was 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, estrogen receptor positive ([place]).   HISTORY OF PRESENT ILLNESS: Bridget Lane is a 84 y.o. female seen in the multidisciplinary breast clinic for a new diagnosis of left breast cancer. The patient was noted to have calcifications in the left breast. Diagnostic imaging revealed a 7 mm group of calcifications and her axilla was negative for adenopathy. She had a biopsy on 05/29/18 which revealed a grade 1 invasive ductal carcinoma with calcifications, her cancer was ER/PR positive, HER2 negative with a Ki 67 of 2%. She comes today to discuss treatment options of her cancer.  PREVIOUS RADIATION THERAPY: No   PAST MEDICAL HISTORY:  Past Medical History:  Diagnosis Date  . Atrial fibrillation ([place])    persistent  . Colon polyp 2005  . Depression   . DJD (degenerative joint disease)   . Dysrhythmia   . Esophageal stricture   . GERD (gastroesophageal reflux disease)   . H. pylori infection   . Hiatal hernia   . Hyperlipidemia   . Macular degeneration   . Nontoxic multinodular goiter   . OA (osteoarthritis)   . Postherpetic neuralgia at T3-T5 level 04/27/2011  . Rectal bleeding 07/06/2014   Hemorrhoid related in past.         PAST SURGICAL HISTORY: Past Surgical History:  Procedure Laterality Date  . ANTERIOR CERVICAL DECOMP/DISCECTOMY FUSION N/A 05/29/2015   Procedure: ANTERIOR CERVICAL DECOMPRESSION/DISCECTOMY FUSION CERVICAL FOUR-FIVE,CERVICAL FIVE-SIX;  Surgeon: Kary Kos, MD;  Location: [facility];  Service: Neurosurgery;  Laterality: N/A;  . APPENDECTOMY    . CARDIAC CATHETERIZATION    . CARDIOVERSION  08/29/2011     Procedure: CARDIOVERSION;  Surgeon: Bing Quarry, MD;  Location: [facility];  Service: Cardiovascular;  Laterality: N/A;  . CARDIOVERSION  11/22/2011   Procedure: CARDIOVERSION;  Surgeon: Coralyn Mark, MD;  Location: [place];  Service: Cardiovascular;  Laterality: N/A;  . CHOLECYSTECTOMY  1989  . COLONOSCOPY W/ POLYPECTOMY  2005   Neg in 2010; Dr Olevia Perches  . ESOPHAGEAL DILATION     > 3 X; Dr Olevia Perches  . LEFT AND RIGHT HEART CATHETERIZATION WITH CORONARY ANGIOGRAM N/A 12/02/2011   Procedure: LEFT AND RIGHT HEART CATHETERIZATION WITH CORONARY ANGIOGRAM;  Surgeon: Jolaine Artist, MD;  Location: [facility];  Service: Cardiovascular;  Laterality: N/A;  . TOTAL ABDOMINAL HYSTERECTOMY  1972   for pain (no BSO)  . TUBAL LIGATION     with appendectomy  . UPPER GI ENDOSCOPY  2010   H pylori     FAMILY HISTORY:  Family History  Problem Relation Age of Onset  . Heart failure Mother   . Coronary artery disease Mother   . Diabetes Mother   . Colon cancer Father        in 77s  . Osteoarthritis Father   . Coronary artery disease Father   . Breast cancer Sister        in 93's  . Diabetes Sister   . Stroke Sister   . Coronary artery disease Brother   . Throat cancer Brother        smoked  .  Breast cancer Maternal Aunt   . Hypothyroidism Sister      SOCIAL HISTORY:  reports that she has never smoked. She has never used smokeless tobacco. She reports that she does not drink alcohol or use drugs.  The patient is married and lives in [place]. 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  ALLERGIES: Penicillins; Codeine; Acetaminophen; Dabigatran etexilate mesylate; Sulfa antibiotics; and Sulfonamide derivatives   MEDICATIONS:  Current Outpatient Medications  Medication Sig Dispense Refill  . alendronate (FOSAMAX) 70 MG tablet Take 1 tablet (70 mg total) by mouth every 7 (seven) days. Take with a full glass of water on an empty stomach. 4 tablet 11  .  apixaban (ELIQUIS) 5 MG TABS tablet Take 1 tablet (5 mg total) by mouth 2 (two) times daily. 180 tablet 3  . docusate sodium (COLACE) 100 MG capsule Take 100 mg by mouth daily as needed for mild constipation.    . flecainide (TAMBOCOR) 100 MG tablet TAKE 1 TABLET(100 MG) BY MOUTH TWICE DAILY 180 tablet 3  . metoprolol tartrate (LOPRESSOR) 25 MG tablet TAKE 1/2 TABLET(12.5 MG) BY MOUTH TWICE DAILY 30 tablet 6  . Multiple Vitamin (MULTIVITAMIN) tablet Take 1 tablet by mouth daily.    Marland Kitchen omeprazole (PRILOSEC) 40 MG capsule Take 1 capsule (40 mg total) by mouth 2 (two) times daily. 60 capsule 1  . tiZANidine (ZANAFLEX) 4 MG tablet Take 4 mg by mouth 2 (two) times daily as needed.  0   No current facility-administered medications for this visit.      REVIEW OF SYSTEMS: On review of systems, the patient reports that she is doing well overall. She denies any chest pain, shortness of breath, cough, fevers, chills, night sweats, unintended weight changes. She denies any bowel or bladder disturbances, and denies abdominal pain, nausea or vomiting. She denies any new musculoskeletal or joint aches or pains. A complete review of systems is obtained and is otherwise negative.     PHYSICAL EXAM:  Wt Readings from Last 3 Encounters:  05/11/18 171 lb (77.6 kg)  03/23/18 169 lb (76.7 kg)  10/27/17 174 lb (78.9 kg)   Temp Readings from Last 3 Encounters:  05/11/18 98.3 F (36.8 C) (Oral)  01/27/17 97.8 F (36.6 C) (Oral)  11/05/16 98 F (36.7 C) (Oral)   BP Readings from Last 3 Encounters:  05/11/18 116/68  03/23/18 120/72  10/24/17 (!) 142/60   Pulse Readings from Last 3 Encounters:  05/11/18 (!) 57  03/23/18 (!) 56  10/24/17 (!) 50     In general this is a well appearing caucasian female in no acute distress. She is alert and oriented x4 and appropriate throughout the examination. HEENT reveals that the patient is normocephalic, atraumatic. EOMs are intact. Skin is intact without any  evidence of gross lesions. Cardiopulmonary assessment is negative for acute distress and she exhibits normal effort.     ECOG = 0  0 - Asymptomatic (Fully active, able to carry on all predisease activities without restriction)  1 - Symptomatic but completely ambulatory (Restricted in physically strenuous activity but ambulatory and able to carry out work of a light or sedentary nature. For example, light housework, office work)  2 - Symptomatic, <50% in bed during the day (Ambulatory and capable of all self care but unable to carry out any work activities. Up and about more than 50% of waking hours)  3 - Symptomatic, >50% in bed, but not bedbound (Capable of only limited  self-care, confined to bed or chair 50% or more of waking hours)  4 - Bedbound (Completely disabled. Cannot carry on any self-care. Totally confined to bed or chair)  5 - Death   Eustace Pen MM, Creech RH, Tormey DC, et al. [phone]). "Toxicity and response criteria of the [facility] Group". [place] Oncol. 5 (6): 649-55    LABORATORY DATA:  Lab Results  Component Value Date   WBC 6.1 05/12/2018   HGB 12.5 05/12/2018   HCT 37.7 05/12/2018   MCV 88.6 05/12/2018   PLT 297.0 05/12/2018   Lab Results  Component Value Date   NA 141 05/12/2018   K 4.4 05/12/2018   CL 105 05/12/2018   CO2 28 05/12/2018   Lab Results  Component Value Date   ALT 16 05/12/2018   AST 16 05/12/2018   ALKPHOS 74 05/12/2018   BILITOT 0.5 05/12/2018      RADIOGRAPHY: Dg Bone Density  Result Date: 05/14/2018 Date of study: 05/14/2018 Exam: DUAL X-RAY ABSORPTIOMETRY (DXA) FOR BONE MINERAL DENSITY (BMD) Instrument: [employer] Requesting Provider: PCP Indication: follow up for low BMD Comparison: 08/24/2008 Clinical data: Pt is a 85 y.o. female with previous history of T11 vertebra mild compression fracture and also lower leg fracture (reportedly). On calcium and vitamin D. Results:  Lumbar spine (L1) L2-L4 Femoral  neck (FN) T-score -2.1 RFN: -1.2 LFN: -1.2 Change in BMD from previous DXA test (%) -13.6%* -4.2%* (*) statistically significant Assessment: the BMD is low according to the [facility] classification for osteoporosis (see below). Fracture risk: moderate FRAX score: 10 year major osteoporotic risk: 17.5%. 10 year hip fracture risk: 3.7%. The thresholds for treatment are 20% and 3%, respectively. Comments: the technical quality of the study is good. Evaluation for secondary causes should be considered if clinically indicated. Recommend optimizing calcium (1200 mg/day) and vitamin D (800 IU/day) intake. Followup: Repeat BMD is appropriate after 2 years or after 1-2 years if starting treatment. WHO criteria for diagnosis of osteoporosis in postmenopausal women and in men 42 y/o or older: - normal: T-score -1.0 to + 1.0 - osteopenia/low bone density: T-score between -2.5 and -1.0 - osteoporosis: T-score below -2.5 - severe osteoporosis: T-score below -2.5 with history of fragility fracture Note: although not part of the WHO classification, the presence of a fragility fracture, regardless of the T-score, should be considered diagnostic of osteoporosis, provided other causes for the fracture have been excluded. Treatment: The National Osteoporosis Foundation recommends that treatment be considered in postmenopausal women and men age 77 or older with: 1. Hip or vertebral (clinical or morphometric) fracture 2. T-score of - 2.5 or lower at the spine or hip 3. 10-year fracture probability by FRAX of at least 20% for a major osteoporotic fracture and 3% for a hip fracture Philemon Kingdom, MD [facility]   Mm Digital Diagnostic Unilat L  Result Date: 05/28/2018 CLINICAL DATA:  84 year old female presenting for evaluation of left breast calcifications identified on her recent screening mammogram. She has family history of breast cancer in a maternal aunt and in her sister. EXAM: DIGITAL DIAGNOSTIC LEFT MAMMOGRAM WITH CAD  COMPARISON:  Previous exam(s). ACR Breast Density Category b: There are scattered areas of fibroglandular density. FINDINGS: In the lateral aspect of the left breast there is a 7 mm group of amorphous calcifications, new from the prior exam. Mammographic images were processed with CAD. IMPRESSION: There is an indeterminate group of calcifications in the lateral left breast. RECOMMENDATION: Stereotactic biopsy is recommended  for the left breast calcifications. This has been scheduled for 05/29/2018 at 12:45 p.m. I have discussed the findings and recommendations with the patient. Results were also provided in writing at the conclusion of the visit. If applicable, a reminder letter will be sent to the patient regarding the next appointment. BI-RADS CATEGORY  4: Suspicious. Electronically Signed   By: Ammie Ferrier M.D.   On: 05/28/2018 13:13   [place] Axilla Left  Result Date: 06/04/2018 CLINICAL DATA:  Patient presents for sonographic evaluation of the left axilla to assess for adenopathy. Patient underwent left breast stereotactic core needle biopsy of calcifications on 05/29/2018, which revealed invasive ductal carcinoma. EXAM: ULTRASOUND OF THE LEFT AXILLA COMPARISON:  Prior mammograms FINDINGS: Ultrasound is performed, showing several normal size normal morphology lymph nodes. There are no enlarged or abnormal lymph nodes. IMPRESSION: No enlarged or abnormal appearing left axillary lymph nodes. RECOMMENDATION: Treatment as planned for the known left breast carcinoma. I have discussed the findings and recommendations with the patient. Results were also provided in writing at the conclusion of the visit. If applicable, a reminder letter will be sent to the patient regarding the next appointment. BI-RADS CATEGORY  6: Known biopsy-proven malignancy. Electronically Signed   By: Lajean Manes M.D.   On: 06/04/2018 08:42   Mm Clip Placement Left  Result Date: 05/29/2018 CLINICAL DATA:  Post biopsy mammogram of the  left breast for clip placement. EXAM: DIAGNOSTIC LEFT MAMMOGRAM POST STEREOTACTIC BIOPSY COMPARISON:  Previous exam(s). FINDINGS: Mammographic images were obtained following stereotactic guided biopsy of calcifications in the lateral left breast. The coil shaped biopsy clip migrated approximately 5 cm medially to the place of biopsy. Spot compression magnification images over the biopsy site demonstrates that there is 1 residual calcification visible which may be used to assist in localization should excision be necessary. IMPRESSION: Approximately 5 cm and medial displacement from the site of biopsy. See above. Final Assessment: Post Procedure Mammograms for Marker Placement Electronically Signed   By: Ammie Ferrier M.D.   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 This was found to be concordant by Dr. Ammie Ferrier. Pathology results were discussed with the patient by telephone. The patient reported doing well after the biopsy with tenderness and bruising at the site. Post biopsy instructions and care were reviewed and questions were answered. The patient was encouraged to call [facility] for any additional concerns. The patient was referred to [facility] on June 10, 2018. The patient is scheduled for a Left axillary ultrasound on June 04, 2018 for evaluation of the lymph nodes due to the invasive component of the diagnosis. Pathology results reported by Terie Purser, RN on 06/02/2018. Electronically Signed   By: Ammie Ferrier M.D.   On: 06/02/2018 14:56   Result Date: 06/02/2018 CLINICAL DATA:  Stereotactic biopsy of left  breast calcifications. EXAM: LEFT BREAST STEREOTACTIC CORE NEEDLE BIOPSY COMPARISON:  Previous exams. FINDINGS: The patient and I discussed the procedure of stereotactic-guided biopsy including benefits and alternatives. We discussed the high likelihood of a successful procedure. We discussed the risks of the procedure including infection, bleeding, tissue injury, clip migration, and inadequate sampling. Informed written  consent was given. The usual time out protocol was performed immediately prior to the procedure. Using sterile technique and 1% Lidocaine as local anesthetic, under stereotactic guidance, a 9 gauge vacuum assisted device was used to perform core needle biopsy of calcifications in the lateral left breast using a lateral approach. Specimen radiograph was performed showing calcifications within multiple core samples. Specimens with calcifications are identified for pathology. Lesion quadrant: Lower outer quadrant At the conclusion of the procedure, a coil shaped tissue marker clip was deployed into the biopsy cavity. Follow-up 2-view mammogram was performed and dictated separately. IMPRESSION: Stereotactic-guided biopsy of calcifications in the lateral left breast. No apparent complications. Electronically Signed: By: Ammie Ferrier M.D. On: 05/29/2018 13:24       IMPRESSION/PLAN: 1. Stage IA, [id number], grade 1 ER/PR positive, invasive ductal carcinoma of the left breast. Dr. Lisbeth Renshaw discusses the pathology findings and reviews the nature of invasive breast disease. The consensus from the breast conference includes breast conservation with lumpectomy without the need for sampling of sentinel nodes.given the grade and early stage, she does not appear to benefit from radiotherapy at this time, but we could reconsider this after surgery. In case there is further discussion of radiotherapy,  we discussed the risks, benefits, short, and long term effects of radiotherapy. We would be happy to meet back  if she does not wish to take antiestrogen which could make the case for considering adjuvant radiotherapy.  2. Possible genetic predisposition to malignancy. The patient is a candidate for genetic testing given her personal and family history. She was offered referral and will be scheduled for referral.  In a visit lasting 35 minutes, greater than 50% of the time was spent face to face discussing her case, and coordinating the patient's care.   The above documentation reflects my direct findings during this shared patient visit. Please see the separate note by Dr. Lisbeth Renshaw on this date for the remainder of the patient's plan of care.    Carola Rhine, PAC

## 2018-06-10 NOTE — H&P (View-Only) (Signed)
[name] Documented: 06/10/2018 7:31 AM Location: [facility] Patient #: [id number] DOB: January 16, 1933 Undefined / Language: Cleophus Molt / Race: White Female  History of Present Illness Marcello Moores A. Cherlynn Popiel MD; 06/10/2018 10:59 AM) Patient words: Pt sent at the request of Dr Lisbeth Renshaw for left breast microcalcifications. She denied breast pain, nipple discharge, or lump. A 7 mm RUOQ mass noted and low grade IDC.      84 year old female presenting for evaluation of left breast calcifications identified on her recent screening mammogram. She has family history of breast cancer in a maternal aunt and in her sister.  EXAM: DIGITAL DIAGNOSTIC LEFT MAMMOGRAM WITH CAD  COMPARISON: Previous exam(s).  ACR Breast Density Category b: There are scattered areas of fibroglandular density.  FINDINGS: In the lateral aspect of the left breast there is a 7 mm group of amorphous calcifications, new from the prior exam.  Mammographic images were processed with CAD.  IMPRESSION: There is an indeterminate group of calcifications in the lateral left breast.  RECOMMENDATION: Stereotactic biopsy is recommended for the left breast calcifications. This has been scheduled for 05/29/2018 at 12:45 p.m.  I have discussed the findings and recommendations with the patient. Results were also provided in writing at the conclusion of the visit. If applicable, a reminder letter will be sent to the patient regarding the next appointment.  BI-RADS CATEGORY 4: Suspicious.   Electronically Signed By: Ammie Ferrier M.D. On: 05/28/2018 13:13       ADDITIONAL INFORMATION: PROGNOSTIC INDICATORS Results: IMMUNOHISTOCHEMICAL AND MORPHOMETRIC ANALYSIS PERFORMED MANUALLY By immunohistochemistry, the tumor cells are negative for Her2 (0). Estrogen Receptor: 100%, POSITIVE, STRONG STAINING INTENSITY Progesterone Receptor: 90%, POSITIVE, STRONG STAINING INTENSITY Proliferation Marker Ki67:  2% REFERENCE RANGE ESTROGEN RECEPTOR NEGATIVE 0% POSITIVE =>1% REFERENCE RANGE PROGESTERONE RECEPTOR NEGATIVE 0% POSITIVE =>1% All controls stained appropriately Enid Cutter MD Pathologist, Electronic Signature ( Signed 06/02/2018) FINAL DIAGNOSIS Diagnosis Breast, left, needle core biopsy, lateral - INVASIVE DUCTAL CARCINOMA, GRADE 1 WITH CALCIFICATIONS. SEE NOTE. - DUCTAL CARCINOMA IN SITU (DCIS), LOW NUCLEAR GRADE.  The patient is a 84 year old female.   Past Surgical History Tawni Pummel, RN; 06/10/2018 7:32 AM) Gallbladder Surgery - Laparoscopic  Diagnostic Studies History Tawni Pummel, RN; 06/10/2018 7:32 AM) Pap Smear >5 years ago  Medication History Tawni Pummel, RN; 06/10/2018 7:32 AM) Medications Reconciled  Social History Tawni Pummel, RN; 06/10/2018 7:32 AM) No alcohol use No caffeine use Tobacco use Never smoker.  Family History Tawni Pummel, RN; 06/10/2018 7:32 AM) Colon Cancer Father. Diabetes Mellitus Mother, Sister. Prostate Cancer Father. Thyroid problems Sister.  Other Problems Tawni Pummel, RN; 06/10/2018 7:32 AM) Arthritis Atrial Fibrillation Cholelithiasis Hemorrhoids     Review of Systems Sunday Spillers Ledford RN; 06/10/2018 7:32 AM) Respiratory Not Present- Bloody sputum, Chronic Cough, Difficulty Breathing, Snoring and Wheezing. Neurological Not Present- Decreased Memory, Fainting, Headaches, Numbness, Seizures, Tingling, Tremor, Trouble walking and Weakness.   Physical Exam (Haynes Giannotti A. Kiing Deakin MD; 06/10/2018 10:59 AM)  General Mental Status-Alert. General Appearance-Consistent with stated age. Hydration-Well hydrated. Voice-Normal.  Head and Neck Head-normocephalic, atraumatic with no lesions or palpable masses. Trachea-midline. Thyroid Gland Characteristics - normal size and consistency.  Eye Eyeball - Bilateral-Extraocular movements intact. Sclera/Conjunctiva - Bilateral-No scleral  icterus.  Chest and Lung Exam Chest and lung exam reveals -quiet, even and easy respiratory effort with no use of accessory muscles and on auscultation, normal breath sounds, no adventitious sounds and normal vocal resonance. Inspection Chest Wall - Normal. Back - normal.  Breast Note: LEFT BREAST  BRUISING RIGHT BREAST NORMAL  Cardiovascular Cardiovascular examination reveals -normal heart sounds, regular rate and rhythm with no murmurs and normal pedal pulses bilaterally.  Neurologic Neurologic evaluation reveals -alert and oriented x 3 with no impairment of recent or remote memory. Mental Status-Normal.  Musculoskeletal Normal Exam - Left-Upper Extremity Strength Normal and Lower Extremity Strength Normal. Normal Exam - Right-Upper Extremity Strength Normal and Lower Extremity Strength Normal.  Lymphatic Head & Neck  General Head & Neck Lymphatics: Bilateral - Description - Normal. Axillary  General Axillary Region: Bilateral - Description - Normal. Tenderness - Non Tender.    Assessment & Plan (Darvin Dials A. Kamau Weatherall MD; 06/10/2018 11:04 AM)  BREAST CANCER, LEFT (C50.912) Impression: PT HAS OPTED FOR LEFT BREAST LUMPECTOMY DISCUSSED PRO AND CONS OF SLN MAPPING GIVEN AGE GRADE AND TUMOR SIZE 8 % RISK OF LN NO DISEASE WITHOUT ANY CHANGE IN SURVIVAL AND MINMAL INCREASE IN LOCOREGIONAL RECURRENCE PT HAS OPTED TO EXCLUDE SLN    Risk of lumpectomy include bleeding, infection, seroma, more surgery, use of seed/wire, wound care, cosmetic deformity and the need for other treatments, death , blood clots, death. Pt agrees to proceed. MAPPING  Current Plans You are being scheduled for surgery- Our schedulers will call you.  You should hear from our office's scheduling department within 5 working days about the location, date, and time of surgery. We try to make accommodations for patient's preferences in scheduling surgery, but sometimes the OR schedule or the  surgeon's schedule prevents [place] from making those accommodations.  If you have not heard from our office [phone]) in 5 working days, call the office and ask for your surgeon's nurse.  If you have other questions about your diagnosis, plan, or surgery, call the office and ask for your surgeon's nurse.  Pt Education - [facility] Breast Cancer Information Given - [facility] "Breast Journey" Package Pt Education - Pamphlet Given - Breast Biopsy: discussed with patient and provided information. We discussed the staging and pathophysiology of breast cancer. We discussed all of the different options for treatment for breast cancer including surgery, chemotherapy, radiation therapy, Herceptin, and antiestrogen therapy. We discussed a sentinel lymph node biopsy as she does not appear to having lymph node involvement right now. We discussed the performance of that with injection of radioactive tracer and blue dye. We discussed that she would have an incision underneath her axillary hairline. We discussed that there is a bout a 10-20% chance of having a positive node with a sentinel lymph node biopsy and we will await the permanent pathology to make any other first further decisions in terms of her treatment. One of these options might be to return to the operating room to perform an axillary lymph node dissection. We discussed about a 1-2% risk lifetime of chronic shoulder pain as well as lymphedema associated with a sentinel lymph node biopsy. We discussed the options for treatment of the breast cancer which included lumpectomy versus a mastectomy. We discussed the performance of the lumpectomy with a wire placement. We discussed a 10-20% chance of a positive margin requiring reexcision in the operating room. We also discussed that she may need radiation therapy or antiestrogen therapy or both if she undergoes lumpectomy. We discussed the mastectomy and the postoperative care for that as well. We discussed that there is  no difference in her survival whether she undergoes lumpectomy with radiation therapy or antiestrogen therapy versus a mastectomy. There is a slight difference in the local recurrence rate being 3-5% with lumpectomy and about  1% with a mastectomy. We discussed the risks of operation including bleeding, infection, possible reoperation. She understands her further therapy will be based on what her stages at the time of her operation.  Pt Education - [facility] Breast Pains Education

## 2018-06-10 NOTE — Progress Notes (Signed)
[facility] CONSULT NOTE  Patient Care Team: [name], MD as PCP - General (Family Medicine) End, Harrell Gave, MD as PCP - Cardiology (Cardiology) Erroll Luna, MD as Consulting Physician (General Surgery) [name], MD as Consulting Physician (Hematology and Oncology) Kyung Rudd, MD as Consulting Physician (Radiation Oncology)  CHIEF COMPLAINTS/PURPOSE OF CONSULTATION:  Newly diagnosed breast cancer  HISTORY OF PRESENTING ILLNESS:  Bridget Lane 84 y.o. female is here because of recent diagnosis of left breast cancer.  Patient had a routine screening mammogram that detected left breast calcifications measuring 7 mm in size.  The clip migrated by 5 cm.  Axilla was negative.  Biopsy of this mass revealed grade 1 invasive ductal carcinoma with calcifications that was ER PR 100% and 90% and Ki-67 2%.  She was discussed with the multidisciplinary tumor board and she is here today to discuss her treatment plan.  I reviewed her records extensively and collaborated the history with the patient.  SUMMARY OF ONCOLOGIC HISTORY:   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, estrogen receptor positive ([place])   05/29/2018 Initial Diagnosis    Screening detected left breast calcifications 7 mm size axilla negative biopsy revealed grade 1 invasive ductal carcinoma with calcifications ER 100%, PR 90%, Ki-67 2%, HER-2 negative, T1 be N0 stage Ia AJCC 8      MEDICAL HISTORY:  Past Medical History:  Diagnosis Date  . Atrial fibrillation ([place])    persistent  . Colon polyp 2005  . Depression   . DJD (degenerative joint disease)   . Dysrhythmia   . Esophageal stricture   . GERD (gastroesophageal reflux disease)   . H. pylori infection   . Hiatal hernia   . Hyperlipidemia   . Macular degeneration   . Nontoxic multinodular goiter   . OA (osteoarthritis)   . Postherpetic neuralgia at T3-T5 level 04/27/2011  . Rectal bleeding 07/06/2014   Hemorrhoid related in past.      SURGICAL HISTORY: Past Surgical History:  Procedure Laterality Date  . ANTERIOR CERVICAL DECOMP/DISCECTOMY FUSION N/A 05/29/2015   Procedure: ANTERIOR CERVICAL DECOMPRESSION/DISCECTOMY FUSION CERVICAL FOUR-FIVE,CERVICAL FIVE-SIX;  Surgeon: Kary Kos, MD;  Location: [facility];  Service: Neurosurgery;  Laterality: N/A;  . APPENDECTOMY    . CARDIAC CATHETERIZATION    . CARDIOVERSION  08/29/2011   Procedure: CARDIOVERSION;  Surgeon: Bing Quarry, MD;  Location: [facility];  Service: Cardiovascular;  Laterality: N/A;  . CARDIOVERSION  11/22/2011   Procedure: CARDIOVERSION;  Surgeon: Coralyn Mark, MD;  Location: [facility];  Service: Cardiovascular;  Laterality: N/A;  . CHOLECYSTECTOMY  1989  . COLONOSCOPY W/ POLYPECTOMY  2005   Neg in 2010; Dr Olevia Perches  . ESOPHAGEAL DILATION     > 3 X; Dr Olevia Perches  . LEFT AND RIGHT HEART CATHETERIZATION WITH CORONARY ANGIOGRAM N/A 12/02/2011   Procedure: LEFT AND RIGHT HEART CATHETERIZATION WITH CORONARY ANGIOGRAM;  Surgeon: Jolaine Artist, MD;  Location: [facility] CATH LAB;  Service: Cardiovascular;  Laterality: N/A;  . TOTAL ABDOMINAL HYSTERECTOMY  1972   for pain (no BSO)  . TUBAL LIGATION     with appendectomy  . UPPER GI ENDOSCOPY  2010   H pylori    SOCIAL HISTORY: Social History   Socioeconomic History  . Marital status: Married    Spouse name: Not on file  . Number of children: 4  . Years of education: Not on file  . Highest education level: Not on file  Occupational History  .  Occupation: retired  Scientific laboratory technician  . Financial resource strain: Not on file  . Food insecurity:    Worry: Not on file    Inability: Not on file  . Transportation needs:    Medical: Not on file    Non-medical: Not on file  Tobacco Use  . Smoking status: Never Smoker  . Smokeless tobacco: Never Used  Substance and Sexual Activity  . Alcohol use: No  . Drug use: No  . Sexual activity: Not on file  Lifestyle  . Physical activity:    Days  per week: Not on file    Minutes per session: Not on file  . Stress: Not on file  Relationships  . Social connections:    Talks on phone: Not on file    Gets together: Not on file    Attends religious service: Not on file    Active member of club or organization: Not on file    Attends meetings of clubs or organizations: Not on file    Relationship status: Not on file  . Intimate partner violence:    Fear of current or ex partner: Not on file    Emotionally abused: Not on file    Physically abused: Not on file    Forced sexual activity: Not on file  Other Topics Concern  . Not on file  Social History Narrative   Married 1956. 4 children 2 boys 2 girls. 16 grandkids.  5 greatgrandkids.    Pt lives in [place] with spouse.        Retired from [employer]      Hobbies: travel, spend time with people, family time   Exercise-walking      No HCPOA-advised to do this.     FAMILY HISTORY: Family History  Problem Relation Age of Onset  . Heart failure Mother   . Coronary artery disease Mother   . Diabetes Mother   . Colon cancer Father        in 62s  . Osteoarthritis Father   . Coronary artery disease Father   . Breast cancer Sister        in 2's  . Diabetes Sister   . Stroke Sister   . Coronary artery disease Brother   . Throat cancer Brother        smoked  . Breast cancer Maternal Aunt   . Hypothyroidism Sister     ALLERGIES:  is allergic to penicillins; codeine; acetaminophen; dabigatran etexilate mesylate; sulfa antibiotics; and sulfonamide derivatives.  MEDICATIONS:  Current Outpatient Medications  Medication Sig Dispense Refill  . alendronate (FOSAMAX) 70 MG tablet Take 1 tablet (70 mg total) by mouth every 7 (seven) days. Take with a full glass of water on an empty stomach. 4 tablet 11  . apixaban (ELIQUIS) 5 MG TABS tablet Take 1 tablet (5 mg total) by mouth 2 (two) times daily. 180 tablet 3  . Calcium-Magnesium-Vitamin D (CALCIUM 1200+D3  PO) Take by mouth.    . flecainide (TAMBOCOR) 100 MG tablet TAKE 1 TABLET(100 MG) BY MOUTH TWICE DAILY 180 tablet 3  . metoprolol tartrate (LOPRESSOR) 25 MG tablet TAKE 1/2 TABLET(12.5 MG) BY MOUTH TWICE DAILY 30 tablet 6  . Multiple Vitamin (MULTIVITAMIN) tablet Take 1 tablet by mouth daily.    . Multiple Vitamins-Minerals (PRESERVISION AREDS 2+MULTI VIT PO) Take by mouth daily.    . NON FORMULARY Take by mouth daily.    Marland Kitchen omeprazole (PRILOSEC) 40 MG capsule Take 1 capsule (40 mg total)  by mouth 2 (two) times daily. 60 capsule 1  . tiZANidine (ZANAFLEX) 4 MG tablet Take 4 mg by mouth 2 (two) times daily as needed.  0  . docusate sodium (COLACE) 100 MG capsule Take 100 mg by mouth daily as needed for mild constipation.     No current facility-administered medications for this visit.     REVIEW OF SYSTEMS:   Constitutional: Denies fevers, chills or abnormal night sweats Eyes: Denies blurriness of vision, double vision or watery eyes Ears, nose, mouth, throat, and face: Denies mucositis or sore throat Respiratory: Denies cough, dyspnea or wheezes Cardiovascular: Denies palpitation, chest discomfort or lower extremity swelling Gastrointestinal:  Denies nausea, heartburn or change in bowel habits Skin: Denies abnormal skin rashes Lymphatics: Denies new lymphadenopathy or easy bruising Neurological:Denies numbness, tingling or new weaknesses Behavioral/Psych: Mood is stable, no new changes    All other systems were reviewed with the patient and are negative.  PHYSICAL EXAMINATION: ECOG PERFORMANCE STATUS: 1 - Symptomatic but completely ambulatory  Vitals:   06/10/18 0850  BP: 127/72  Pulse: (!) 58  Resp: 16  Temp: 98.4 F (36.9 C)  SpO2: 100%   Filed Weights   06/10/18 0850  Weight: 173 lb 3.2 oz (78.6 kg)    GENERAL:alert, no distress and comfortable SKIN: skin color, texture, turgor are normal, no rashes or significant lesions EYES: normal, conjunctiva are pink and  non-injected, sclera clear OROPHARYNX:no exudate, no erythema and lips, buccal mucosa, and tongue normal  NECK: supple, thyroid normal size, non-tender, without nodularity LYMPH:  no palpable lymphadenopathy in the cervical, axillary or inguinal LUNGS: clear to auscultation and percussion with normal breathing effort HEART: regular rate & rhythm and no murmurs and no lower extremity edema ABDOMEN:abdomen soft, non-tender and normal bowel sounds Musculoskeletal:no cyanosis of digits and no clubbing  PSYCH: alert & oriented x 3 with fluent speech NEURO: no focal motor/sensory deficits    LABORATORY DATA:  I have reviewed the data as listed Lab Results  Component Value Date   WBC 6.4 06/10/2018   HGB 12.3 06/10/2018   HCT 37.0 06/10/2018   MCV 88.9 06/10/2018   PLT 261 06/10/2018   Lab Results  Component Value Date   NA 143 06/10/2018   K 4.1 06/10/2018   CL 108 06/10/2018   CO2 25 06/10/2018    RADIOGRAPHIC STUDIES: I have personally reviewed the radiological reports and agreed with the findings in the report.  ASSESSMENT AND PLAN:  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, estrogen receptor positive ([place]) 05/29/2018:Screening detected left breast calcifications 7 mm size axilla negative biopsy revealed grade 1 invasive ductal carcinoma with calcifications ER 100%, PR 90%, Ki-67 2%, HER-2 negative, T1b N0 stage Ia AJCC 8  Pathology and radiology counseling: Discussed with the patient, the details of pathology including the type of breast cancer,the clinical staging, the significance of ER, PR and HER-2/neu receptors and the implications for treatment. After reviewing the pathology in detail, we proceeded to discuss the different treatment options between surgery, radiation, chemotherapy, antiestrogen therapies.  Plan: 1.  Breast conserving surgery 2. followed by adjuvant antiestrogen therapy with letrozole 2.5 mg daily x5 years.  Radiation oncology  discussed the pros and cons of radiation and determined that based on favorable prognostic features that she would not need adjuvant radiation.  Osteoporosis: Patient has had a diagnosis of osteoporosis but the T score was -2.1.  She is currently on calcium vitamin D along with bone density evaluations every  2 years.  Return to clinic after surgery to discuss pathology report and to initiate antiestrogen therapy.   All questions were answered. The patient knows to call the clinic with any problems, questions or concerns.    Harriette Ohara, MD 06/10/18

## 2018-06-10 NOTE — Progress Notes (Signed)
[facility] Psychosocial Distress Screening Clinical Social Work  Clinical Social Work was referred by distress screening protocol.  The patient scored a unscreened during visit on the Psychosocial Distress Thermometer which indicates unable to assess via Distress Screen, patient self reported little/no anxiety or issues related to cancer diagnosis/treatment distress. Clinical Social Worker contacted patient by phone to assess for distress and other psychosocial needs.   Distress Screen was not completed/available to review.  Based on CSW assessment of patient, patient reports little/no distress. "I am feeling fine about all this, it is not as serious as I had thought."  Aware that she will be having surgery only.  Significant family support from husband, children, church family and grandchildren.  Has several friends who have also had cancer.  Provided brief information and support, encouraged patient to reach out as needed in future.  Gave information packet w resources.    Clinical Social Worker follow up needed: No.  If yes, follow up plan:  Bridget Lane, [place], LCSW Clinical Social Worker Phone:  [phone]

## 2018-06-10 NOTE — H&P (Signed)
[name] Documented: 06/10/2018 7:31 AM Location: [facility] Patient #: [id number] DOB: 11/08/1932 Undefined / Language: Cleophus Molt / Race: White Female  History of Present Illness Marcello Moores A. Olanda Downie MD; 06/10/2018 10:59 AM) Patient words: Pt sent at the request of Dr Lisbeth Renshaw for left breast microcalcifications. She denied breast pain, nipple discharge, or lump. A 7 mm RUOQ mass noted and low grade IDC.      84 year old female presenting for evaluation of left breast calcifications identified on her recent screening mammogram. She has family history of breast cancer in a maternal aunt and in her sister.  EXAM: DIGITAL DIAGNOSTIC LEFT MAMMOGRAM WITH CAD  COMPARISON: Previous exam(s).  ACR Breast Density Category b: There are scattered areas of fibroglandular density.  FINDINGS: In the lateral aspect of the left breast there is a 7 mm group of amorphous calcifications, new from the prior exam.  Mammographic images were processed with CAD.  IMPRESSION: There is an indeterminate group of calcifications in the lateral left breast.  RECOMMENDATION: Stereotactic biopsy is recommended for the left breast calcifications. This has been scheduled for 05/29/2018 at 12:45 p.m.  I have discussed the findings and recommendations with the patient. Results were also provided in writing at the conclusion of the visit. If applicable, a reminder letter will be sent to the patient regarding the next appointment.  BI-RADS CATEGORY 4: Suspicious.   Electronically Signed By: Ammie Ferrier M.D. On: 05/28/2018 13:13       ADDITIONAL INFORMATION: PROGNOSTIC INDICATORS Results: IMMUNOHISTOCHEMICAL AND MORPHOMETRIC ANALYSIS PERFORMED MANUALLY By immunohistochemistry, the tumor cells are negative for Her2 (0). Estrogen Receptor: 100%, POSITIVE, STRONG STAINING INTENSITY Progesterone Receptor: 90%, POSITIVE, STRONG STAINING INTENSITY Proliferation Marker Ki67:  2% REFERENCE RANGE ESTROGEN RECEPTOR NEGATIVE 0% POSITIVE =>1% REFERENCE RANGE PROGESTERONE RECEPTOR NEGATIVE 0% POSITIVE =>1% All controls stained appropriately Enid Cutter MD Pathologist, Electronic Signature ( Signed 06/02/2018) FINAL DIAGNOSIS Diagnosis Breast, left, needle core biopsy, lateral - INVASIVE DUCTAL CARCINOMA, GRADE 1 WITH CALCIFICATIONS. SEE NOTE. - DUCTAL CARCINOMA IN SITU (DCIS), LOW NUCLEAR GRADE.  The patient is a 84 year old female.   Past Surgical History Tawni Pummel, RN; 06/10/2018 7:32 AM) Gallbladder Surgery - Laparoscopic  Diagnostic Studies History Tawni Pummel, RN; 06/10/2018 7:32 AM) Pap Smear >5 years ago  Medication History Tawni Pummel, RN; 06/10/2018 7:32 AM) Medications Reconciled  Social History Tawni Pummel, RN; 06/10/2018 7:32 AM) No alcohol use No caffeine use Tobacco use Never smoker.  Family History Tawni Pummel, RN; 06/10/2018 7:32 AM) Colon Cancer Father. Diabetes Mellitus Mother, Sister. Prostate Cancer Father. Thyroid problems Sister.  Other Problems Tawni Pummel, RN; 06/10/2018 7:32 AM) Arthritis Atrial Fibrillation Cholelithiasis Hemorrhoids     Review of Systems Sunday Spillers Ledford RN; 06/10/2018 7:32 AM) Respiratory Not Present- Bloody sputum, Chronic Cough, Difficulty Breathing, Snoring and Wheezing. Neurological Not Present- Decreased Memory, Fainting, Headaches, Numbness, Seizures, Tingling, Tremor, Trouble walking and Weakness.   Physical Exam (Loreen Bankson A. Amberli Ruegg MD; 06/10/2018 10:59 AM)  General Mental Status-Alert. General Appearance-Consistent with stated age. Hydration-Well hydrated. Voice-Normal.  Head and Neck Head-normocephalic, atraumatic with no lesions or palpable masses. Trachea-midline. Thyroid Gland Characteristics - normal size and consistency.  Eye Eyeball - Bilateral-Extraocular movements intact. Sclera/Conjunctiva - Bilateral-No scleral  icterus.  Chest and Lung Exam Chest and lung exam reveals -quiet, even and easy respiratory effort with no use of accessory muscles and on auscultation, normal breath sounds, no adventitious sounds and normal vocal resonance. Inspection Chest Wall - Normal. Back - normal.  Breast Note: LEFT BREAST  BRUISING RIGHT BREAST NORMAL  Cardiovascular Cardiovascular examination reveals -normal heart sounds, regular rate and rhythm with no murmurs and normal pedal pulses bilaterally.  Neurologic Neurologic evaluation reveals -alert and oriented x 3 with no impairment of recent or remote memory. Mental Status-Normal.  Musculoskeletal Normal Exam - Left-Upper Extremity Strength Normal and Lower Extremity Strength Normal. Normal Exam - Right-Upper Extremity Strength Normal and Lower Extremity Strength Normal.  Lymphatic Head & Neck  General Head & Neck Lymphatics: Bilateral - Description - Normal. Axillary  General Axillary Region: Bilateral - Description - Normal. Tenderness - Non Tender.    Assessment & Plan (Steffani Dionisio A. Becci Batty MD; 06/10/2018 11:04 AM)  BREAST CANCER, LEFT (C50.912) Impression: PT HAS OPTED FOR LEFT BREAST LUMPECTOMY DISCUSSED PRO AND CONS OF SLN MAPPING GIVEN AGE GRADE AND TUMOR SIZE 8 % RISK OF LN NO DISEASE WITHOUT ANY CHANGE IN SURVIVAL AND MINMAL INCREASE IN LOCOREGIONAL RECURRENCE PT HAS OPTED TO EXCLUDE SLN    Risk of lumpectomy include bleeding, infection, seroma, more surgery, use of seed/wire, wound care, cosmetic deformity and the need for other treatments, death , blood clots, death. Pt agrees to proceed. MAPPING  Current Plans You are being scheduled for surgery- Our schedulers will call you.  You should hear from our office's scheduling department within 5 working days about the location, date, and time of surgery. We try to make accommodations for patient's preferences in scheduling surgery, but sometimes the OR schedule or the  surgeon's schedule prevents [place] from making those accommodations.  If you have not heard from our office [phone]) in 5 working days, call the office and ask for your surgeon's nurse.  If you have other questions about your diagnosis, plan, or surgery, call the office and ask for your surgeon's nurse.  Pt Education - [facility] Breast Cancer Information Given - [facility] "Breast Journey" Package Pt Education - Pamphlet Given - Breast Biopsy: discussed with patient and provided information. We discussed the staging and pathophysiology of breast cancer. We discussed all of the different options for treatment for breast cancer including surgery, chemotherapy, radiation therapy, Herceptin, and antiestrogen therapy. We discussed a sentinel lymph node biopsy as she does not appear to having lymph node involvement right now. We discussed the performance of that with injection of radioactive tracer and blue dye. We discussed that she would have an incision underneath her axillary hairline. We discussed that there is a bout a 10-20% chance of having a positive node with a sentinel lymph node biopsy and we will await the permanent pathology to make any other first further decisions in terms of her treatment. One of these options might be to return to the operating room to perform an axillary lymph node dissection. We discussed about a 1-2% risk lifetime of chronic shoulder pain as well as lymphedema associated with a sentinel lymph node biopsy. We discussed the options for treatment of the breast cancer which included lumpectomy versus a mastectomy. We discussed the performance of the lumpectomy with a wire placement. We discussed a 10-20% chance of a positive margin requiring reexcision in the operating room. We also discussed that she may need radiation therapy or antiestrogen therapy or both if she undergoes lumpectomy. We discussed the mastectomy and the postoperative care for that as well. We discussed that there is  no difference in her survival whether she undergoes lumpectomy with radiation therapy or antiestrogen therapy versus a mastectomy. There is a slight difference in the local recurrence rate being 3-5% with lumpectomy and about  1% with a mastectomy. We discussed the risks of operation including bleeding, infection, possible reoperation. She understands her further therapy will be based on what her stages at the time of her operation.  Pt Education - [facility] Breast Pains Education

## 2018-06-16 ENCOUNTER — Telehealth: Payer: Self-pay | Admitting: Internal Medicine

## 2018-06-16 NOTE — Telephone Encounter (Signed)
   [facility] Pre-operative Risk Assessment    Request for surgical clearance:  1. What type of surgery is being performed? Breast Lumpectomy   2. When is this surgery scheduled? TBD   3. What type of clearance is required (medical clearance vs. Pharmacy clearance to hold med vs. Both)?  Both  4. Are there any medications that need to be held prior to surgery and how long? Eliquis 2-3 days prior   5. Practice name and name of physician performing surgery?  [facility]- Dr. Marlou Starks   6. What is your office phone number? [phone]    7.   What is your office fax number? [place], CMA  8.   Anesthesia type (None, local, MAC, general) ? Not mentioned   _________________________________________________________________   (provider comments below)

## 2018-06-16 NOTE — Telephone Encounter (Signed)
Spoke to the patient and informed her that we received the surgical clearance and it was being entered into the computer system.  She was thankful for the call.

## 2018-06-16 NOTE — Telephone Encounter (Signed)
New message:      Pt is calling to see if we have received a surgical clearance paper for her. I let my pt know that the office can give [place] a call and we can send the clearance over for them.

## 2018-06-18 NOTE — Telephone Encounter (Signed)
Pt takes Eliquis for afib with CHADS2VASc score of 4 (age x2, sex, CAD). Renal function is normal. Ok to hold Eliquis for 2 days prior to biopsy.

## 2018-06-18 NOTE — Telephone Encounter (Signed)
Follow Up:      Pt is returning a call from today. 

## 2018-06-18 NOTE — Telephone Encounter (Addendum)
   Primary Cardiologist: Nelva Bush, MD  Chart reviewed as part of pre-operative protocol coverage. Patient was contacted 06/18/2018 in reference to pre-operative risk assessment for pending surgery as outlined below.  Bridget Lane was last seen on 03/23/2018 by Dr End.  Since that day, Bridget Lane has done well.  She has some chronic dyspnea on exertion that has not changed.  She has not had any chest pain.  Therefore, based on ACC/AHA guidelines, the patient would be at acceptable risk for the planned procedure without further cardiovascular testing.   Per pharmacist, okay to hold Eliquis for 2 days prior to biopsy  I will route this recommendation to the requesting party via [facility] fax function and remove from pre-op pool.  Please call with questions.  Rosaria Ferries, PA-C 06/18/2018, 3:34 PM   5. [facility]- Dr. Marlou Starks   6. What is your office phone number? [phone]    7.   What is your office fax number? [place], [place]

## 2018-06-19 ENCOUNTER — Other Ambulatory Visit: Payer: Self-pay | Admitting: Surgery

## 2018-06-19 ENCOUNTER — Encounter (HOSPITAL_BASED_OUTPATIENT_CLINIC_OR_DEPARTMENT_OTHER): Payer: Self-pay | Admitting: *Deleted

## 2018-06-19 ENCOUNTER — Other Ambulatory Visit: Payer: Self-pay

## 2018-06-19 DIAGNOSIS — C50912 Malignant neoplasm of unspecified site of left female breast: Secondary | ICD-10-CM

## 2018-06-19 DIAGNOSIS — Z171 Estrogen receptor negative status [ER-]: Principal | ICD-10-CM

## 2018-06-21 HISTORY — PX: BREAST LUMPECTOMY: SHX2

## 2018-06-23 ENCOUNTER — Ambulatory Visit
Admission: RE | Admit: 2018-06-23 | Discharge: 2018-06-23 | Disposition: A | Discharge status: Home / Self Care | Payer: Medicare Other | Source: Ambulatory Visit | Attending: Surgery | Admitting: Surgery

## 2018-06-23 DIAGNOSIS — D0512 Intraductal carcinoma in situ of left breast: Secondary | ICD-10-CM | POA: Diagnosis not present

## 2018-06-23 DIAGNOSIS — C50912 Malignant neoplasm of unspecified site of left female breast: Secondary | ICD-10-CM

## 2018-06-23 DIAGNOSIS — Z171 Estrogen receptor negative status [ER-]: Principal | ICD-10-CM

## 2018-06-25 ENCOUNTER — Ambulatory Visit (HOSPITAL_BASED_OUTPATIENT_CLINIC_OR_DEPARTMENT_OTHER)
Admission: RE | Admit: 2018-06-25 | Discharge: 2018-06-25 | Disposition: A | Discharge status: Home / Self Care | Payer: Medicare Other | Source: Ambulatory Visit | Attending: Surgery | Admitting: Surgery

## 2018-06-25 ENCOUNTER — Ambulatory Visit (HOSPITAL_BASED_OUTPATIENT_CLINIC_OR_DEPARTMENT_OTHER): Payer: Medicare Other | Admitting: Anesthesiology

## 2018-06-25 ENCOUNTER — Encounter (HOSPITAL_BASED_OUTPATIENT_CLINIC_OR_DEPARTMENT_OTHER)
Admission: RE | Disposition: A | Discharge status: Home / Self Care | Payer: Self-pay | Source: Ambulatory Visit | Attending: Surgery

## 2018-06-25 ENCOUNTER — Encounter (HOSPITAL_BASED_OUTPATIENT_CLINIC_OR_DEPARTMENT_OTHER): Payer: Self-pay | Admitting: Certified Registered Nurse Anesthetist

## 2018-06-25 ENCOUNTER — Ambulatory Visit
Admission: RE | Admit: 2018-06-25 | Discharge: 2018-06-25 | Disposition: A | Discharge status: Home / Self Care | Payer: Medicare Other | Source: Ambulatory Visit | Attending: Surgery | Admitting: Surgery

## 2018-06-25 DIAGNOSIS — C50912 Malignant neoplasm of unspecified site of left female breast: Secondary | ICD-10-CM

## 2018-06-25 DIAGNOSIS — Z171 Estrogen receptor negative status [ER-]: Principal | ICD-10-CM

## 2018-06-25 DIAGNOSIS — I4891 Unspecified atrial fibrillation: Secondary | ICD-10-CM | POA: Insufficient documentation

## 2018-06-25 DIAGNOSIS — K219 Gastro-esophageal reflux disease without esophagitis: Secondary | ICD-10-CM | POA: Diagnosis not present

## 2018-06-25 DIAGNOSIS — Z7901 Long term (current) use of anticoagulants: Secondary | ICD-10-CM | POA: Insufficient documentation

## 2018-06-25 DIAGNOSIS — D0512 Intraductal carcinoma in situ of left breast: Secondary | ICD-10-CM | POA: Diagnosis not present

## 2018-06-25 DIAGNOSIS — Z170 Estrogen receptor positive status [ER+]: Secondary | ICD-10-CM | POA: Insufficient documentation

## 2018-06-25 DIAGNOSIS — M1990 Unspecified osteoarthritis, unspecified site: Secondary | ICD-10-CM | POA: Diagnosis not present

## 2018-06-25 DIAGNOSIS — R928 Other abnormal and inconclusive findings on diagnostic imaging of breast: Secondary | ICD-10-CM | POA: Diagnosis not present

## 2018-06-25 DIAGNOSIS — Z800 Family history of malignant neoplasm of digestive organs: Secondary | ICD-10-CM | POA: Diagnosis not present

## 2018-06-25 DIAGNOSIS — Z79899 Other long term (current) drug therapy: Secondary | ICD-10-CM | POA: Insufficient documentation

## 2018-06-25 DIAGNOSIS — K449 Diaphragmatic hernia without obstruction or gangrene: Secondary | ICD-10-CM | POA: Insufficient documentation

## 2018-06-25 DIAGNOSIS — E785 Hyperlipidemia, unspecified: Secondary | ICD-10-CM | POA: Diagnosis not present

## 2018-06-25 DIAGNOSIS — C50512 Malignant neoplasm of lower-outer quadrant of left female breast: Secondary | ICD-10-CM | POA: Diagnosis not present

## 2018-06-25 HISTORY — PX: BREAST LUMPECTOMY WITH RADIOACTIVE SEED LOCALIZATION: SHX6424

## 2018-06-25 SURGERY — BREAST LUMPECTOMY WITH RADIOACTIVE SEED LOCALIZATION
Anesthesia: General | Site: Breast | Laterality: Left

## 2018-06-25 MED ORDER — HYDROMORPHONE HCL 1 MG/ML IJ SOLN
0.2500 mg | INTRAMUSCULAR | Status: DC | PRN
  Administered 2018-06-25 (×2): 0.25 mg via INTRAVENOUS

## 2018-06-25 MED ORDER — HYDROMORPHONE HCL 1 MG/ML IJ SOLN
INTRAMUSCULAR | Status: AC
  Filled 2018-06-25: qty 0.5

## 2018-06-25 MED ORDER — LIDOCAINE 2% (20 MG/ML) 5 ML SYRINGE
INTRAMUSCULAR | Status: DC | PRN
  Administered 2018-06-25: 60 mg via INTRAVENOUS

## 2018-06-25 MED ORDER — FENTANYL CITRATE (PF) 100 MCG/2ML IJ SOLN
INTRAMUSCULAR | Status: AC
  Filled 2018-06-25: qty 2

## 2018-06-25 MED ORDER — EPHEDRINE 5 MG/ML INJ
INTRAVENOUS | Status: AC
  Filled 2018-06-25: qty 10

## 2018-06-25 MED ORDER — MIDAZOLAM HCL 2 MG/2ML IJ SOLN: 1.0000 mg | INTRAMUSCULAR | Status: DC | PRN

## 2018-06-25 MED ORDER — DEXAMETHASONE SODIUM PHOSPHATE 10 MG/ML IJ SOLN
INTRAMUSCULAR | Status: AC
  Filled 2018-06-25: qty 1

## 2018-06-25 MED ORDER — GABAPENTIN 300 MG PO CAPS
ORAL_CAPSULE | ORAL | Status: AC
  Filled 2018-06-25: qty 1

## 2018-06-25 MED ORDER — CHLORHEXIDINE GLUCONATE CLOTH 2 % EX PADS: 6.0000 | MEDICATED_PAD | Freq: Once | CUTANEOUS | Status: DC

## 2018-06-25 MED ORDER — BUPIVACAINE-EPINEPHRINE (PF) 0.25% -1:200000 IJ SOLN
INTRAMUSCULAR | Status: DC | PRN
  Administered 2018-06-25: 20 mL via PERINEURAL

## 2018-06-25 MED ORDER — ONDANSETRON HCL 4 MG/2ML IJ SOLN
INTRAMUSCULAR | Status: AC
  Filled 2018-06-25: qty 2

## 2018-06-25 MED ORDER — LIDOCAINE 2% (20 MG/ML) 5 ML SYRINGE
INTRAMUSCULAR | Status: AC
  Filled 2018-06-25: qty 5

## 2018-06-25 MED ORDER — SCOPOLAMINE 1 MG/3DAYS TD PT72: 1.0000 | MEDICATED_PATCH | Freq: Once | TRANSDERMAL | Status: DC | PRN

## 2018-06-25 MED ORDER — EPHEDRINE SULFATE-NACL 50-0.9 MG/10ML-% IV SOSY
PREFILLED_SYRINGE | INTRAVENOUS | Status: DC | PRN
  Administered 2018-06-25 (×2): 10 mg via INTRAVENOUS

## 2018-06-25 MED ORDER — GABAPENTIN 300 MG PO CAPS
300.0000 mg | ORAL_CAPSULE | ORAL | Status: AC
  Administered 2018-06-25: 300 mg via ORAL

## 2018-06-25 MED ORDER — DEXAMETHASONE SODIUM PHOSPHATE 10 MG/ML IJ SOLN
INTRAMUSCULAR | Status: DC | PRN
  Administered 2018-06-25: 10 mg via INTRAVENOUS

## 2018-06-25 MED ORDER — PROPOFOL 10 MG/ML IV BOLUS
INTRAVENOUS | Status: AC
  Filled 2018-06-25: qty 20

## 2018-06-25 MED ORDER — CLINDAMYCIN PHOSPHATE 900 MG/50ML IV SOLN
900.0000 mg | INTRAVENOUS | Status: AC
  Administered 2018-06-25: 900 mg via INTRAVENOUS

## 2018-06-25 MED ORDER — CLINDAMYCIN PHOSPHATE 900 MG/50ML IV SOLN
INTRAVENOUS | Status: AC
  Filled 2018-06-25: qty 50

## 2018-06-25 MED ORDER — PROPOFOL 10 MG/ML IV BOLUS
INTRAVENOUS | Status: DC | PRN
  Administered 2018-06-25: 80 mg via INTRAVENOUS

## 2018-06-25 MED ORDER — ONDANSETRON HCL 4 MG/2ML IJ SOLN
INTRAMUSCULAR | Status: DC | PRN
  Administered 2018-06-25: 4 mg via INTRAVENOUS

## 2018-06-25 MED ORDER — LACTATED RINGERS IV SOLN
INTRAVENOUS | Status: DC
  Administered 2018-06-25: 12:00:00 via INTRAVENOUS

## 2018-06-25 MED ORDER — FENTANYL CITRATE (PF) 100 MCG/2ML IJ SOLN: 50.0000 ug | INTRAMUSCULAR | Status: DC | PRN

## 2018-06-25 MED ORDER — FENTANYL CITRATE (PF) 100 MCG/2ML IJ SOLN
INTRAMUSCULAR | Status: DC | PRN
  Administered 2018-06-25: 50 ug via INTRAVENOUS

## 2018-06-25 MED ORDER — IBUPROFEN 600 MG PO TABS: 600.0000 mg | ORAL_TABLET | Freq: Three times a day (TID) | ORAL | 2 refills | Status: DC | PRN

## 2018-06-25 SURGICAL SUPPLY — 52 items
ADH SKN CLS APL DERMABOND .7 (GAUZE/BANDAGES/DRESSINGS) ×1
APPLIER CLIP 9.375 MED OPEN (MISCELLANEOUS)
APR CLP MED 9.3 20 MLT OPN (MISCELLANEOUS)
BINDER BREAST LRG (GAUZE/BANDAGES/DRESSINGS) IMPLANT
BINDER BREAST MEDIUM (GAUZE/BANDAGES/DRESSINGS) ×1 IMPLANT
BINDER BREAST XLRG (GAUZE/BANDAGES/DRESSINGS) IMPLANT
BINDER BREAST XXLRG (GAUZE/BANDAGES/DRESSINGS) IMPLANT
BLADE SURG 15 STRL LF DISP TIS (BLADE) ×1 IMPLANT
BLADE SURG 15 STRL SS (BLADE) ×2
CANISTER SUC SOCK COL 7IN (MISCELLANEOUS) IMPLANT
CANISTER SUCT 1200ML W/VALVE (MISCELLANEOUS) ×1 IMPLANT
CHLORAPREP W/TINT 26ML (MISCELLANEOUS) ×2 IMPLANT
CLIP APPLIE 9.375 MED OPEN (MISCELLANEOUS) IMPLANT
COVER BACK TABLE 60X90IN (DRAPES) ×2 IMPLANT
COVER MAYO STAND STRL (DRAPES) ×2 IMPLANT
COVER PROBE W GEL 5X96 (DRAPES) ×2 IMPLANT
DECANTER SPIKE VIAL GLASS SM (MISCELLANEOUS) IMPLANT
DERMABOND ADVANCED (GAUZE/BANDAGES/DRESSINGS) ×1
DERMABOND ADVANCED .7 DNX12 (GAUZE/BANDAGES/DRESSINGS) ×1 IMPLANT
DEVICE DUBIN W/COMP PLATE 8390 (MISCELLANEOUS) ×2 IMPLANT
DRAPE LAPAROSCOPIC ABDOMINAL (DRAPES) IMPLANT
DRAPE LAPAROTOMY 100X72 PEDS (DRAPES) ×2 IMPLANT
DRAPE UTILITY XL STRL (DRAPES) ×2 IMPLANT
ELECT COATED BLADE 2.86 ST (ELECTRODE) ×2 IMPLANT
ELECT REM PT RETURN 9FT ADLT (ELECTROSURGICAL) ×2
ELECTRODE REM PT RTRN 9FT ADLT (ELECTROSURGICAL) ×1 IMPLANT
GLOVE BIOGEL PI IND STRL 6.5 (GLOVE) IMPLANT
GLOVE BIOGEL PI IND STRL 8 (GLOVE) ×1 IMPLANT
GLOVE BIOGEL PI INDICATOR 6.5 (GLOVE) ×1
GLOVE BIOGEL PI INDICATOR 8 (GLOVE) ×1
GLOVE ECLIPSE 8.0 STRL XLNG CF (GLOVE) ×2 IMPLANT
GLOVE SURG SS PI 6.5 STRL IVOR (GLOVE) ×1 IMPLANT
GOWN STRL REUS W/ TWL LRG LVL3 (GOWN DISPOSABLE) ×2 IMPLANT
GOWN STRL REUS W/TWL LRG LVL3 (GOWN DISPOSABLE) ×4
HEMOSTAT ARISTA ABSORB 3G PWDR (MISCELLANEOUS) IMPLANT
HEMOSTAT SNOW SURGICEL 2X4 (HEMOSTASIS) IMPLANT
KIT MARKER MARGIN INK (KITS) ×2 IMPLANT
NDL HYPO 25X1 1.5 SAFETY (NEEDLE) ×1 IMPLANT
NEEDLE HYPO 25X1 1.5 SAFETY (NEEDLE) ×2 IMPLANT
NS IRRIG 1000ML POUR BTL (IV SOLUTION) ×2 IMPLANT
PACK BASIN DAY SURGERY FS (CUSTOM PROCEDURE TRAY) ×2 IMPLANT
PENCIL BUTTON HOLSTER BLD 10FT (ELECTRODE) ×2 IMPLANT
SLEEVE SCD COMPRESS KNEE MED (MISCELLANEOUS) ×2 IMPLANT
SPONGE LAP 4X18 RFD (DISPOSABLE) ×2 IMPLANT
SUT MNCRL AB 4-0 PS2 18 (SUTURE) ×2 IMPLANT
SUT SILK 2 0 SH (SUTURE) IMPLANT
SUT VICRYL 3-0 CR8 SH (SUTURE) ×2 IMPLANT
SYR CONTROL 10ML LL (SYRINGE) ×2 IMPLANT
TOWEL GREEN STERILE FF (TOWEL DISPOSABLE) ×2 IMPLANT
TOWEL OR NON WOVEN STRL DISP B (DISPOSABLE) ×2 IMPLANT
TUBE CONNECTING 20X1/4 (TUBING) ×1 IMPLANT
YANKAUER SUCT BULB TIP NO VENT (SUCTIONS) ×1 IMPLANT

## 2018-06-25 NOTE — Interval H&P Note (Signed)
History and Physical Interval Note:  06/25/2018 11:09 AM  Bridget Lane  has presented today for surgery, with the diagnosis of LEFT BREAST CANCER  The various methods of treatment have been discussed with the patient and family. After consideration of risks, benefits and other options for treatment, the patient has consented to  Procedure(s): BREAST LUMPECTOMY WITH RADIOACTIVE SEED LOCALIZATION (Left) as a surgical intervention .  The patient's history has been reviewed, patient examined, no change in status, stable for surgery.  I have reviewed the patient's chart and labs.  Questions were answered to the patient's satisfaction.     [place]

## 2018-06-25 NOTE — Transfer of Care (Signed)
Immediate Anesthesia Transfer of Care Note  Patient: Bridget Lane  Procedure(s) Performed: BREAST LUMPECTOMY WITH RADIOACTIVE SEED LOCALIZATION (Left Breast)  Patient Location: PACU  Anesthesia Type:General  Level of Consciousness: awake, alert  and oriented  Airway & Oxygen Therapy: Patient Spontanous Breathing and Patient connected to face mask oxygen  Post-op Assessment: Report given to RN  Post vital signs: Reviewed and stable  Last Vitals:  Vitals Value Taken Time  BP 136/60 06/25/2018 12:34 PM  Temp    Pulse 63 06/25/2018 12:36 PM  Resp 23 06/25/2018 12:36 PM  SpO2 98 % 06/25/2018 12:36 PM  Vitals shown include unvalidated device data.  Last Pain:  Vitals:   06/25/18 1034  TempSrc: Oral  PainSc: 3          Complications: No apparent anesthesia complications

## 2018-06-25 NOTE — Anesthesia Preprocedure Evaluation (Addendum)
Anesthesia Evaluation  Patient identified by MRN, date of birth, ID band Patient awake    Reviewed: Allergy & Precautions, H&P , NPO status , Patient's Chart, lab work & pertinent test results, reviewed documented beta blocker date and time   Airway Mallampati: II  TM Distance: >3 FB Neck ROM: Full    Dental no notable dental hx. (+) Partial Upper, Dental Advisory Given   Pulmonary neg pulmonary ROS,    Pulmonary exam normal breath sounds clear to auscultation       Cardiovascular + dysrhythmias Atrial Fibrillation  Rhythm:Regular Rate:Normal     Neuro/Psych Depression negative neurological ROS     GI/Hepatic Neg liver ROS, hiatal hernia, GERD  Medicated and Controlled,  Endo/Other  negative endocrine ROS  Renal/GU negative Renal ROS  negative genitourinary   Musculoskeletal  (+) Arthritis ,   Abdominal   Peds  Hematology negative hematology ROS (+)   Anesthesia Other Findings   Reproductive/Obstetrics negative OB ROS                            Anesthesia Physical Anesthesia Plan  ASA: III  Anesthesia Plan: General   Post-op Pain Management:    Induction: Intravenous  PONV Risk Score and Plan: 4 or greater and Ondansetron, Dexamethasone and Treatment may vary due to age or medical condition  Airway Management Planned: LMA  Additional Equipment:   Intra-op Plan:   Post-operative Plan: Extubation in OR  Informed Consent: I have reviewed the patients History and Physical, chart, labs and discussed the procedure including the risks, benefits and alternatives for the proposed anesthesia with the patient or authorized representative who has indicated his/her understanding and acceptance.   Dental advisory given  Plan Discussed with: CRNA  Anesthesia Plan Comments:         Anesthesia Quick Evaluation

## 2018-06-25 NOTE — Anesthesia Procedure Notes (Signed)
Procedure Name: LMA Insertion Date/Time: 06/25/2018 11:43 AM Performed by: Raenette Rover, CRNA Pre-anesthesia Checklist: Patient identified, Emergency Drugs available, Suction available and Patient being monitored Patient Re-evaluated:Patient Re-evaluated prior to induction Oxygen Delivery Method: Circle system utilized Preoxygenation: Pre-oxygenation with 100% oxygen Induction Type: IV induction LMA: LMA inserted LMA Size: 4.0 Number of attempts: 1 Placement Confirmation: positive ETCO2,  CO2 detector and breath sounds checked- equal and bilateral Tube secured with: Tape Dental Injury: Teeth and Oropharynx as per pre-operative assessment

## 2018-06-25 NOTE — Discharge Instructions (Signed)
[place] Office Phone Number [phone]  BREAST BIOPSY/LUMPECTOMY: POST OP INSTRUCTIONS  Always review your discharge instruction sheet given to you by the facility where your surgery was performed.  IF YOU HAVE DISABILITY OR FAMILY LEAVE FORMS, YOU MUST BRING THEM TO THE OFFICE FOR PROCESSING.  DO NOT GIVE THEM TO YOUR DOCTOR.  1. A prescription for pain medication may be given to you upon discharge.  Take your pain medication as prescribed, if needed.  If narcotic pain medicine is not needed, then you may take acetaminophen (Tylenol) or ibuprofen (Advil) as needed. 2. Take your usually prescribed medications unless otherwise directed 3. If you need a refill on your pain medication, please contact your pharmacy.  They will contact our office to request authorization.  Prescriptions will not be filled after 5pm or on week-ends. 4. You should eat very light the first 24 hours after surgery, such as soup, crackers, pudding, etc.  Resume your normal diet the day after surgery. 5. Most patients will experience some swelling and bruising in the breast.  Ice packs and a good support bra will help.  Swelling and bruising can take several days to resolve.  6. It is common to experience some constipation if taking pain medication after surgery.  Increasing fluid intake and taking a stool softener will usually help or prevent this problem from occurring.  A mild laxative (Milk of Magnesia or Miralax) should be taken according to package directions if there are no bowel movements after 48 hours. 7. Unless discharge instructions indicate otherwise, you may remove your bandages 24-48 hours after surgery, and you may shower at that time.  You may have steri-strips (small skin tapes) in place directly over the incision.  These strips should be left on the skin for 7-10 days.  If your surgeon used skin glue on the incision, you may shower in 24 hours.  The glue will flake off over the next 2-3  weeks.  Any sutures or staples will be removed at the office during your follow-up visit. 8. ACTIVITIES:  You may resume regular daily activities (gradually increasing) beginning the next day.  Wearing a good support bra or sports bra minimizes pain and swelling.  You may have sexual intercourse when it is comfortable. a. You may drive when you no longer are taking prescription pain medication, you can comfortably wear a seatbelt, and you can safely maneuver your car and apply brakes. b. RETURN TO WORK:  ______________________________________________________________________________________ 9. You should see your doctor in the office for a follow-up appointment approximately two weeks after your surgery.  Your doctors nurse will typically make your follow-up appointment when she calls you with your pathology report.  Expect your pathology report 2-3 business days after your surgery.  You may call to check if you do not hear from [place] after three days. 1. OTHER INSTRUCTIONS: _____________________ 2. Fever over 101.0 3. Nausea and/or vomiting. 4. Extreme swelling or bruising. 5. Continued bleeding from incision. 6. Increased pain, redness, or drainage from the incision.  The clinic staff is available to answer your questions during regular business hours.  Please dont hesitate to call and ask to speak to one of the nurses for clinical concerns.  If you have a medical emergency, go to the nearest emergency room or call 911.  A surgeon from [facility] is always on call at the hospital.  For further questions, please visit [email or web address]    Post Anesthesia Home Care Instructions  Activity: Get plenty of  rest for the remainder of the day. A responsible individual must stay with you for 24 hours following the procedure.  For the next 24 hours, DO NOT: -Drive a car -Paediatric nurse -Drink alcoholic beverages -Take any medication unless instructed by your physician -Make  any legal decisions or sign important papers.  Meals: Start with liquid foods such as gelatin or soup. Progress to regular foods as tolerated. Avoid greasy, spicy, heavy foods. If nausea and/or vomiting occur, drink only clear liquids until the nausea and/or vomiting subsides. Call your physician if vomiting continues.  Special Instructions/Symptoms: Your throat may feel dry or sore from the anesthesia or the breathing tube placed in your throat during surgery. If this causes discomfort, gargle with warm salt water. The discomfort should disappear within 24 hours.  If you had a scopolamine patch placed behind your ear for the management of post- operative nausea and/or vomiting:  1. The medication in the patch is effective for 72 hours, after which it should be removed.  Wrap patch in a tissue and discard in the trash. Wash hands thoroughly with soap and water. 2. You may remove the patch earlier than 72 hours if you experience unpleasant side effects which may include dry mouth, dizziness or visual disturbances. 3. Avoid touching the patch. Wash your hands with soap and water after contact with the patch.

## 2018-06-25 NOTE — Op Note (Signed)
Preoperative diagnosis: Left breast cancer stage I  Postoperative diagnosis: Same  Procedure: Left breast seed lumpectomy  Surgeon: Erroll Luna, MD  Anesthesia: LMA with local  EBL: 10 cc  Specimen: Left breast tissue with seed.  Of note clip was displaced prior to surgery and this was discussed with radiology preoperatively.  Additional medial and lateral margins taken.  Drains: None  IV fluids: Per anesthesia record  Indications for procedure: The patient presents for left breast lumpectomy for stage I left breast cancer.  She opted for breast conservation.The procedure has been discussed with the patient. Alternatives to surgery have been discussed with the patient.  Risks of surgery include bleeding,  Infection,  Seroma formation, death,  and the need for further surgery.   The patient understands and wishes to proceed.     Description of procedure: The patient was met in the holding area.  Neoprobe was used to identify the seed in her left breast.  Questions were answered.  She was taken back to the operating room.  She was placed supine upon the operating room table.  After induction of LMA anesthesia, left breast was prepped and draped in sterile fashion and timeout was done.  She received appropriate preoperative antibiotics.  Neoprobe was used and seed localized left lateral breast.  Curvilinear incision was made in the left lateral breast.  Dissection was carried down all tissue around the seed were excised.  The clip had been displaced and was out of the operative field.  Faxitron revealed the seed to be in the specimen and this was sent to pathology.  I excised both medial lateral margins for an additional margin.  This is a very generous lumpectomy.  Local anesthetic infiltrated of 0.25% Sensorcaine with epinephrine.  Cavity then closed after securing hemostasis with 3-0 Vicryl and 4-0 Monocryl.  Dermabond applied.  All final counts found to be correct of sponge, needles and  instruments.  The patient was awoke extubated taken to recovery in satisfactory condition.  Breast binder placed.

## 2018-06-25 NOTE — Anesthesia Postprocedure Evaluation (Signed)
Anesthesia Post Note  Patient: Bridget Lane  Procedure(s) Performed: BREAST LUMPECTOMY WITH RADIOACTIVE SEED LOCALIZATION (Left Breast)     Patient location during evaluation: PACU Anesthesia Type: General Level of consciousness: awake and alert Pain management: pain level controlled Vital Signs Assessment: post-procedure vital signs reviewed and stable Respiratory status: spontaneous breathing, nonlabored ventilation, respiratory function stable and patient connected to nasal cannula oxygen Cardiovascular status: blood pressure returned to baseline and stable Postop Assessment: no apparent nausea or vomiting Anesthetic complications: no    Last Vitals:  Vitals:   06/25/18 1328 06/25/18 1349  BP:  129/60  Pulse: (!) 56 66  Resp: 12 16  Temp:  36.4 C  SpO2: 98% 94%    Last Pain:  Vitals:   06/25/18 1349  TempSrc:   PainSc: 0-No pain                 Montez Hageman

## 2018-06-26 ENCOUNTER — Encounter (HOSPITAL_BASED_OUTPATIENT_CLINIC_OR_DEPARTMENT_OTHER): Payer: Self-pay | Admitting: Surgery

## 2018-07-02 ENCOUNTER — Inpatient Hospital Stay: Payer: Medicare Other

## 2018-07-02 ENCOUNTER — Telehealth: Payer: Self-pay | Admitting: Hematology and Oncology

## 2018-07-02 ENCOUNTER — Encounter: Payer: Self-pay | Admitting: Licensed Clinical Social Worker

## 2018-07-02 ENCOUNTER — Inpatient Hospital Stay (HOSPITAL_BASED_OUTPATIENT_CLINIC_OR_DEPARTMENT_OTHER): Payer: Medicare Other | Admitting: Hematology and Oncology

## 2018-07-02 ENCOUNTER — Inpatient Hospital Stay: Payer: Medicare Other | Attending: Hematology and Oncology | Admitting: Licensed Clinical Social Worker

## 2018-07-02 DIAGNOSIS — Z170 Estrogen receptor positive status [ER+]: Secondary | ICD-10-CM

## 2018-07-02 DIAGNOSIS — Z8042 Family history of malignant neoplasm of prostate: Secondary | ICD-10-CM

## 2018-07-02 DIAGNOSIS — C50512 Malignant neoplasm of lower-outer quadrant of left female breast: Secondary | ICD-10-CM

## 2018-07-02 DIAGNOSIS — Z803 Family history of malignant neoplasm of breast: Secondary | ICD-10-CM

## 2018-07-02 DIAGNOSIS — M810 Age-related osteoporosis without current pathological fracture: Secondary | ICD-10-CM | POA: Diagnosis not present

## 2018-07-02 DIAGNOSIS — Z1379 Encounter for other screening for genetic and chromosomal anomalies: Secondary | ICD-10-CM | POA: Insufficient documentation

## 2018-07-02 DIAGNOSIS — Z800 Family history of malignant neoplasm of digestive organs: Secondary | ICD-10-CM | POA: Insufficient documentation

## 2018-07-02 MED ORDER — LETROZOLE 2.5 MG PO TABS: 2.5000 mg | ORAL_TABLET | Freq: Every day | ORAL | 3 refills | Status: DC

## 2018-07-02 NOTE — Assessment & Plan Note (Signed)
06/25/2018: Left lumpectomy: IDC grade 1, 0.4 cm, DCIS low-grade, margins negative, left additional medial excision: DCIS low-grade, margins negative, ER 100%, PR 90%, HER-2 negative, Ki-67 2%, T1 a N0 stage Ia  Pathology counseling: I discussed the final pathology report of the patient provided  a copy of this report. I discussed the margins as well as lymph node surgeries. We also discussed the final staging along with previously performed ER/PR and HER-2/neu testing.  Recommendation: Adjuvant antiestrogen therapy with letrozole 2.5 mg daily x5 years Radiation oncology did not feel that radiation would be necessary based on favorable prognostic features and her age.  Osteoporosis: Patient has had a diagnosis of osteoporosis but the T score was -2.1.  She is currently on calcium vitamin D along with bone density evaluations every 2 years. 

## 2018-07-02 NOTE — Progress Notes (Signed)
REFERRING PROVIDER: Hayden Pedro, PA-C [place], [place] [address]  PRIMARY PROVIDER:  [name], MD  PRIMARY REASON FOR VISIT:  1. Family history of breast cancer   2. Family history of prostate cancer   3. Family history of esophageal cancer   4. Genetic testing     HISTORY OF PRESENT ILLNESS:   Bridget Lane, a 84 y.o. female, was seen for a [place] cancer genetics consultation at the request of Dr. Dara Lords due to a personal and family history of breast cancer.  Bridget Lane presents to clinic today to discuss the possibility of a hereditary predisposition to cancer, genetic testing, and to further clarify her future cancer risks, as well as potential cancer risks for family members.   In 2019, at the age of 75, Bridget Lane was diagnosed with left breast cancer ER+/PR+/Her2-. She has had a lumpectomy.   CANCER HISTORY:    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, estrogen receptor positive ([place])   05/29/2018 Initial Diagnosis    Screening detected left breast calcifications 7 mm size axilla negative biopsy revealed grade 1 invasive ductal carcinoma with calcifications ER 100%, PR 90%, Ki-67 2%, HER-2 negative, T1 be N0 stage Ia AJCC 8    06/25/2018 Surgery    Left lumpectomy: IDC grade 1, 0.4 cm, DCIS low-grade, margins negative, left additional medial excision: DCIS low-grade, margins negative, ER 100%, PR 90%, HER-2 negative, Ki-67 2%, T1 a N0 stage Ia      HORMONAL RISK FACTORS:  Menarche was at age 39.  First live birth at age 55.  OCP use for approximately 0 years.  Ovaries intact: yes.  Hysterectomy: no.  Menopausal status: postmenopausal.  HRT use: 0 years. Colonoscopy: yes; abnormal, multiple polyps. (she reports >7) Mammogram within the last year: yes. Number of breast biopsies: 2., one a long time ago that turned out to be fluid.   Past Medical History:  Diagnosis Date  . Atrial fibrillation ([place])    persistent   . Colon polyp 2005  . Depression   . DJD (degenerative joint disease)   . Dysrhythmia   . Esophageal stricture   . Family history of breast cancer   . Family history of esophageal cancer   . Family history of prostate cancer   . GERD (gastroesophageal reflux disease)   . H. pylori infection   . Hiatal hernia   . Hyperlipidemia   . Macular degeneration   . Nontoxic multinodular goiter   . OA (osteoarthritis)   . Postherpetic neuralgia at T3-T5 level 04/27/2011  . Rectal bleeding 07/06/2014   Hemorrhoid related in past.      Past Surgical History:  Procedure Laterality Date  . ANTERIOR CERVICAL DECOMP/DISCECTOMY FUSION N/A 05/29/2015   Procedure: ANTERIOR CERVICAL DECOMPRESSION/DISCECTOMY FUSION CERVICAL FOUR-FIVE,CERVICAL FIVE-SIX;  Surgeon: Kary Kos, MD;  Location: [facility];  Service: Neurosurgery;  Laterality: N/A;  . APPENDECTOMY    . BREAST LUMPECTOMY WITH RADIOACTIVE SEED LOCALIZATION Left 06/25/2018   Procedure: BREAST LUMPECTOMY WITH RADIOACTIVE SEED LOCALIZATION;  Surgeon: Erroll Luna, MD;  Location: [facility];  Service: General;  Laterality: Left;  . CARDIAC CATHETERIZATION    . CARDIOVERSION  08/29/2011   Procedure: CARDIOVERSION;  Surgeon: Bing Quarry, MD;  Location: [facility];  Service: Cardiovascular;  Laterality: N/A;  . CARDIOVERSION  11/22/2011   Procedure: CARDIOVERSION;  Surgeon: Coralyn Mark, MD;  Location: [facility];  Service: Cardiovascular;  Laterality: N/A;  . CHOLECYSTECTOMY  1989  . COLONOSCOPY W/ POLYPECTOMY  2005   Neg in 2010; Dr Olevia Perches  . ESOPHAGEAL DILATION     > 3 X; Dr Olevia Perches  . LEFT AND RIGHT HEART CATHETERIZATION WITH CORONARY ANGIOGRAM N/A 12/02/2011   Procedure: LEFT AND RIGHT HEART CATHETERIZATION WITH CORONARY ANGIOGRAM;  Surgeon: Jolaine Artist, MD;  Location: [facility];  Service: Cardiovascular;  Laterality: N/A;  . TOTAL ABDOMINAL HYSTERECTOMY  1972   for pain (no BSO)  . TUBAL LIGATION     with appendectomy  .  UPPER GI ENDOSCOPY  2010   H pylori    Social History   Socioeconomic History  . Marital status: Married    Spouse name: Not on file  . Number of children: 4  . Years of education: Not on file  . Highest education level: Not on file  Occupational History  . Occupation: retired  Scientific laboratory technician  . Financial resource strain: Not on file  . Food insecurity:    Worry: Not on file    Inability: Not on file  . Transportation needs:    Medical: Not on file    Non-medical: Not on file  Tobacco Use  . Smoking status: Never Smoker  . Smokeless tobacco: Never Used  Substance and Sexual Activity  . Alcohol use: No  . Drug use: No  . Sexual activity: Not on file  Lifestyle  . Physical activity:    Days per week: Not on file    Minutes per session: Not on file  . Stress: Not on file  Relationships  . Social connections:    Talks on phone: Not on file    Gets together: Not on file    Attends religious service: Not on file    Active member of club or organization: Not on file    Attends meetings of clubs or organizations: Not on file    Relationship status: Not on file  Other Topics Concern  . Not on file  Social History Narrative   Married 1956. 4 children 2 boys 2 girls. 16 grandkids.  5 greatgrandkids.    Pt lives in [place] with spouse.        Retired from [employer]      Hobbies: travel, spend time with people, family time   Exercise-walking      No HCPOA-advised to do this.      FAMILY HISTORY:  We obtained a detailed, 4-generation family history.  Significant diagnoses are listed below: Family History  Problem Relation Age of Onset  . Heart failure Mother   . Coronary artery disease Mother   . Diabetes Mother   . Osteoarthritis Father   . Coronary artery disease Father   . Prostate cancer Father        in 76s  . Breast cancer Sister        in 35's  . Diabetes Sister   . Esophageal cancer Brother        smoked  . Breast cancer Maternal  Aunt    Bridget Lane has two sons, ages 30 and 65, and two daughters, ages 69 and 69. No cancer history. She has 15 grandchildren. She had two sisters, one diagnosed with breast cancer in her 53's and passed away in her 22's, the other died at 47. She had one brother as well who had esophageal cancer and died at 24. She has four nephews and one niece.  Bridget Lane father had prostate cancer in his 48's and  died at 33. He had a sister that had cancer, but Bridget Lane does not know the type. He also had at least four brothers, possibly more. No knowledge of cancer in paternal cousins. Both of Bridget Lane's paternal grandparents died in their 53's, no known cancers.  Bridget Lane mother died at  45 from diabetes complications. She had a sister that had breast cancer diagnosed at 33. She had one other sister and three brothers, no cancers. Bridget Lane reports the possibility of cancer in her maternal cousins but she does not know any details. Ms. Lowenstein maternal grandmother died in her 33's and her maternal grandfather died in his 71's.   Bridget Lane is unaware of previous family history of genetic testing for hereditary cancer risks. Patient's maternal ancestors are of [place] descent, and paternal ancestors are of [place] descent. There is no reported Ashkenazi Jewish ancestry, though Ms. Hargrove believes her mother might have had Jewish ancestry. There is no known consanguinity.  GENETIC COUNSELING ASSESSMENT: Bridget Lane is a 84 y.o. female with a personal and family history of breast cancer which is somewhat suggestive of a Hereditary Cancer Predisposition Syndrome. We, therefore, discussed and recommended the following at today's visit.   DISCUSSION: We discussed that about 5-10% of breast cancer cases are hereditary with most cases due to BRCA mutations.  Other genes associated with hereditary breast cancer cases include ATM, CHEK2 and PALB2.  We reviewed the characteristics, features and inheritance  patterns of hereditary cancer syndromes. We also discussed genetic testing, including the appropriate family members to test, the process of testing, insurance coverage and turn-around-time for results. We discussed the implications of a negative, positive and/or variant of uncertain significant result. We recommended (patient) pursue genetic testing for the [facility] Common Hereditary Cancers Panel.   The Common Hereditary Cancers Panel offered by [facility] includes sequencing and/or deletion duplication testing of the following 47 genes: APC, ATM, AXIN2, BARD1, BMPR1A, BRCA1, BRCA2, BRIP1, CDH1, CDKN2A (p14ARF), CDKN2A (p16INK4a), CKD4, CHEK2, CTNNA1, DICER1, EPCAM (Deletion/duplication testing only), GREM1 (promoter region deletion/duplication testing only), KIT, MEN1, MLH1, MSH2, MSH3, MSH6, MUTYH, NBN, NF1, NHTL1, PALB2, PDGFRA, PMS2, POLD1, POLE, PTEN, RAD50, RAD51C, RAD51D, SDHB, SDHC, SDHD, SMAD4, SMARCA4. STK11, TP53, TSC1, TSC2, and VHL.  The following genes were evaluated for sequence changes only: SDHA and HOXB13 c.251G>A variant only.  We discussed that if she is found to have a mutation in one of these genes, it may impact surgical decisions, and alter future medical management recommendations such as increased cancer screenings and consideration of risk reducing surgeries.  A positive result could also have implications for the patient's family members.  A Negative result would mean we were unable to identify a hereditary component to her cancer, but does not rule out the possibility of a hereditary basis for her cancer.  There could be mutations that are undetectable by current technology, or in genes not yet tested or identified to increase cancer risk.    We discussed the potential to find a Variant of Uncertain Significance or VUS.  These are variants that have not yet been identified as pathogenic or benign, and it is unknown if this variant is associated with increased cancer risk or if this  is a normal finding.  Most VUS's are reclassified to benign or likely benign.   It should not be used to make medical management decisions. With time, we suspect the lab will determine the significance of any VUS's identified if any.   Based on Ms.  Romagnoli's personal and family history of cancer, she meets medical criteria for genetic testing. Despite that she meets criteria, she may still have an out of pocket cost.   PLAN: Despite our recommendation, Ms. Brockbank did not wish to pursue genetic testing at today's visit. She stated that she will discuss it further with her children and call me if she chooses to pursue testing in the future. We understand this decision, and remain available to coordinate genetic testing at any time in the future. We therefore recommend Ms. Pulcini continue to follow the cancer screening guidelines given by her primary healthcare provider.  Lastly, we encouraged Ms. Studzinski to remain in contact with cancer genetics annually so that we can continuously update the family history and inform her of any changes in cancer genetics and testing that may be of benefit for this family.   Ms.  Pistole questions were answered to her satisfaction today. Our contact information was provided should additional questions or concerns arise. Thank you for the referral and allowing [place] to share in the care of your patient.   Epimenio Foot, MS Genetic Counselor [email or web address] Phone: [phone]  The patient was seen for a total of 35 minutes in face-to-face genetic counseling. This patient was discussed with Drs. Magrinat, Lindi Adie and/or Burr Medico who agrees with the above.

## 2018-07-02 NOTE — Telephone Encounter (Signed)
Gave avs and calendar ° °

## 2018-07-02 NOTE — Progress Notes (Signed)
Patient Care Team: [name], MD as PCP - General (Family Medicine) End, Harrell Gave, MD as PCP - Cardiology (Cardiology) Erroll Luna, MD as Consulting Physician (General Surgery) [name], MD as Consulting Physician (Hematology and Oncology) Kyung Rudd, MD as Consulting Physician (Radiation Oncology)  DIAGNOSIS:  Encounter Diagnosis  Name Primary?  . 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, estrogen receptor positive ([place])     SUMMARY OF ONCOLOGIC HISTORY:   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, estrogen receptor positive ([place])   05/29/2018 Initial Diagnosis    Screening detected left breast calcifications 7 mm size axilla negative biopsy revealed grade 1 invasive ductal carcinoma with calcifications ER 100%, PR 90%, Ki-67 2%, HER-2 negative, T1 be N0 stage Ia AJCC 8    06/25/2018 Surgery    Left lumpectomy: IDC grade 1, 0.4 cm, DCIS low-grade, margins negative, left additional medial excision: DCIS low-grade, margins negative, ER 100%, PR 90%, HER-2 negative, Ki-67 2%, T1 a N0 stage Ia     CHIEF COMPLIANT: Follow-up after recent left lumpectomy  INTERVAL HISTORY: Bridget Lane is a 85-year with above-mentioned history of left breast cancer treated with lumpectomy and is here today to discuss the final pathology report.  She is recovered and healing very well from the recent surgery.  REVIEW OF SYSTEMS:   Constitutional: Denies fevers, chills or abnormal weight loss Eyes: Denies blurriness of vision Ears, nose, mouth, throat, and face: Denies mucositis or sore throat Respiratory: Denies cough, dyspnea or wheezes Cardiovascular: Denies palpitation, chest discomfort Gastrointestinal:  Denies nausea, heartburn or change in bowel habits Skin: Denies abnormal skin rashes Lymphatics: Denies new lymphadenopathy or easy bruising Neurological:Denies numbness, tingling or new weaknesses Behavioral/Psych: Mood  is stable, no new changes  Extremities: No lower extremity edema Breast: Recent left lumpectomy All other systems were reviewed with the patient and are negative.  I have reviewed the past medical history, past surgical history, social history and family history with the patient and they are unchanged from previous note.  ALLERGIES:  is allergic to penicillins; codeine; acetaminophen; dabigatran etexilate mesylate; sulfa antibiotics; and sulfonamide derivatives.  MEDICATIONS:  Current Outpatient Medications  Medication Sig Dispense Refill  . alendronate (FOSAMAX) 70 MG tablet Take 1 tablet (70 mg total) by mouth every 7 (seven) days. Take with a full glass of water on an empty stomach. 4 tablet 11  . apixaban (ELIQUIS) 5 MG TABS tablet Take 1 tablet (5 mg total) by mouth 2 (two) times daily. 180 tablet 3  . docusate sodium (COLACE) 100 MG capsule Take 100 mg by mouth daily as needed for mild constipation.    . flecainide (TAMBOCOR) 100 MG tablet TAKE 1 TABLET(100 MG) BY MOUTH TWICE DAILY 180 tablet 3  . ibuprofen (ADVIL,MOTRIN) 600 MG tablet Take 1 tablet (600 mg total) by mouth every 8 (eight) hours as needed. 30 tablet 2  . metoprolol tartrate (LOPRESSOR) 25 MG tablet TAKE 1/2 TABLET(12.5 MG) BY MOUTH TWICE DAILY 30 tablet 6  . Multiple Vitamin (MULTIVITAMIN) tablet Take 1 tablet by mouth daily.    . Multiple Vitamins-Minerals (PRESERVISION AREDS 2+MULTI VIT PO) Take by mouth daily.    Marland Kitchen omeprazole (PRILOSEC) 40 MG capsule Take 1 capsule (40 mg total) by mouth 2 (two) times daily. 60 capsule 1  . tiZANidine (ZANAFLEX) 4 MG tablet Take 4 mg by mouth 2 (two) times daily as needed.  0   No current facility-administered medications for this visit.  PHYSICAL EXAMINATION: ECOG PERFORMANCE STATUS: 1 - Symptomatic but completely ambulatory  Vitals:   07/02/18 1352  BP: (!) 142/77  Pulse: (!) 54  Resp: 17  Temp: 98.5 F (36.9 C)  SpO2: 97%   Filed Weights   07/02/18 1352  Weight:  174 lb 1.6 oz (79 kg)    GENERAL:alert, no distress and comfortable SKIN: skin color, texture, turgor are normal, no rashes or significant lesions EYES: normal, Conjunctiva are pink and non-injected, sclera clear OROPHARYNX:no exudate, no erythema and lips, buccal mucosa, and tongue normal  NECK: supple, thyroid normal size, non-tender, without nodularity LYMPH:  no palpable lymphadenopathy in the cervical, axillary or inguinal LUNGS: clear to auscultation and percussion with normal breathing effort HEART: regular rate & rhythm and no murmurs and no lower extremity edema ABDOMEN:abdomen soft, non-tender and normal bowel sounds MUSCULOSKELETAL:no cyanosis of digits and no clubbing  NEURO: alert & oriented x 3 with fluent speech, no focal motor/sensory deficits EXTREMITIES: No lower extremity edema   LABORATORY DATA:  I have reviewed the data as listed CMP Latest Ref Rng & Units 06/10/2018 05/12/2018 03/06/2018  Glucose 70 - 99 mg/dL 79 100(H) 73  BUN 8 - 23 mg/dL _0 Creatinine 0.44 - 1.00 mg/dL 0.95 1.07 1.07(H)  Sodium 135 - 145 mmol/L 143 141 141  Potassium 3.5 - 5.1 mmol/L 4.1 4.4 4.8  Chloride 98 - 111 mmol/L 108 105 104  CO2 22 - 32 mmol/L _1 Calcium 8.9 - 10.3 mg/dL 9.1 9.3 9.6  Total Protein 6.5 - 8.1 g/dL 7.3 6.9 -  Total Bilirubin 0.3 - 1.2 mg/dL 0.7 0.5 -  Alkaline Phos 38 - 126 U/L 86 74 -  AST 15 - 41 U/L 19 16 -  ALT 0 - 44 U/L 15 16 -    Lab Results  Component Value Date   WBC 6.4 06/10/2018   HGB 12.3 06/10/2018   HCT 37.0 06/10/2018   MCV 88.9 06/10/2018   PLT 261 06/10/2018   NEUTROABS 3.7 06/10/2018    ASSESSMENT & PLAN:  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, estrogen receptor positive ([place]) 06/25/2018: Left lumpectomy: IDC grade 1, 0.4 cm, DCIS low-grade, margins negative, left additional medial excision: DCIS low-grade, margins negative, ER 100%, PR 90%, HER-2 negative, Ki-67 2%, T1 a N0 stage Ia  Pathology  counseling: I discussed the final pathology report of the patient provided  a copy of this report. I discussed the margins as well as lymph node surgeries. We also discussed the final staging along with previously performed ER/PR and HER-2/neu testing.  Recommendation: Adjuvant antiestrogen therapy with letrozole 2.5 mg daily x5 years Radiation oncology did not feel that radiation would be necessary based on favorable prognostic features and her age.  Osteoporosis: Patient has had a diagnosis of osteoporosis but the T score was -2.1.  She is currently on calcium vitamin D along with bone density evaluations every 2 years.  Return to clinic in 3 months for follow-up  No orders of the defined types were placed in this encounter.  The patient has a good understanding of the overall plan. she agrees with it. she will call with any problems that may develop before the next visit here.   Harriette Ohara, MD 07/02/18

## 2018-07-03 ENCOUNTER — Telehealth: Payer: Self-pay | Admitting: Adult Health

## 2018-07-03 ENCOUNTER — Encounter: Payer: Self-pay | Admitting: *Deleted

## 2018-07-03 NOTE — Telephone Encounter (Signed)
Per 9/12 sch msg, made appt for SCP.  Called patient, left message with date/time.  Mailed calendar.

## 2018-07-05 ENCOUNTER — Other Ambulatory Visit: Payer: Self-pay | Admitting: Family Medicine

## 2018-07-06 ENCOUNTER — Other Ambulatory Visit: Payer: Self-pay

## 2018-07-06 DIAGNOSIS — H43813 Vitreous degeneration, bilateral: Secondary | ICD-10-CM | POA: Diagnosis not present

## 2018-07-06 DIAGNOSIS — H353132 Nonexudative age-related macular degeneration, bilateral, intermediate dry stage: Secondary | ICD-10-CM | POA: Diagnosis not present

## 2018-07-06 MED ORDER — OMEPRAZOLE 40 MG PO CPDR: 40.0000 mg | DELAYED_RELEASE_CAPSULE | Freq: Two times a day (BID) | ORAL | 5 refills | Status: DC

## 2018-07-13 ENCOUNTER — Ambulatory Visit: Payer: Self-pay | Admitting: Surgery

## 2018-09-19 ENCOUNTER — Other Ambulatory Visit: Payer: Self-pay | Admitting: Internal Medicine

## 2018-09-21 NOTE — Telephone Encounter (Signed)
Please review for refill, Thanks !  

## 2018-09-24 ENCOUNTER — Ambulatory Visit: Payer: Medicare Other | Admitting: Internal Medicine

## 2018-09-24 ENCOUNTER — Encounter: Payer: Self-pay | Admitting: Internal Medicine

## 2018-09-24 VITALS — BP 134/68 | HR 58 | Ht 63.0 in | Wt 172.0 lb

## 2018-09-24 DIAGNOSIS — R0609 Other forms of dyspnea: Secondary | ICD-10-CM

## 2018-09-24 DIAGNOSIS — I4819 Other persistent atrial fibrillation: Secondary | ICD-10-CM

## 2018-09-24 DIAGNOSIS — R0602 Shortness of breath: Secondary | ICD-10-CM | POA: Diagnosis not present

## 2018-09-24 DIAGNOSIS — R0600 Dyspnea, unspecified: Secondary | ICD-10-CM

## 2018-09-24 MED ORDER — FUROSEMIDE 20 MG PO TABS: 20.0000 mg | ORAL_TABLET | Freq: Every day | ORAL | 3 refills | Status: DC

## 2018-09-24 NOTE — Progress Notes (Signed)
Follow-up Outpatient Visit Date: 09/24/2018  Primary Care Provider: Marin Lane, Bridget Lane [place] [address]  Chief Complaint: Shortness of breath  HPI:  Bridget Lane is a 84 y.o. year-old female with history of paroxysmal atrial fibrillation, hyperlipidemia, mild, nonobstructive CAD by catheterization in 2013, and GERD, who presents for follow-up of atrial fibrillation.  I last saw Bridget Lane in June, at which time she was feeling a bit down due to her husband's health issues.  She noted dyspnea when walking up steep hills, which had been unchanged for years.  No medications changes were made at that time.  Today, Bridget Lane reports that she is under quite a bit of stress at home with her husband's health as well as remodeling of her house.  She has felt more short of breath and fatigued since our last visit.  She also notes more swelling in her legs.  She denies orthopnea and PND as well as chest pain, palpitations, and lightheadedness.  She underwent lumpectomy of the left breast earlier this year and is on Femara.  She did not require radiation or chemotherapy.  --------------------------------------------------------------------------------------------------  Past Medical History:  Diagnosis Date  . Atrial fibrillation ([place])    persistent  . Colon polyp 2005  . Depression   . DJD (degenerative joint disease)   . Dysrhythmia   . Esophageal stricture   . Family history of breast cancer   . Family history of esophageal cancer   . Family history of prostate cancer   . GERD (gastroesophageal reflux disease)   . H. pylori infection   . Hiatal hernia   . Hyperlipidemia   . Macular degeneration   . Nontoxic multinodular goiter   . OA (osteoarthritis)   . Postherpetic neuralgia at T3-T5 level 04/27/2011  . Rectal bleeding 07/06/2014   Hemorrhoid related in past.     Past Surgical History:  Procedure Laterality Date  . ANTERIOR CERVICAL DECOMP/DISCECTOMY FUSION  N/A 05/29/2015   Procedure: ANTERIOR CERVICAL DECOMPRESSION/DISCECTOMY FUSION CERVICAL FOUR-FIVE,CERVICAL FIVE-SIX;  Surgeon: Kary Kos, MD;  Location: [facility];  Service: Neurosurgery;  Laterality: N/A;  . APPENDECTOMY    . BREAST LUMPECTOMY WITH RADIOACTIVE SEED LOCALIZATION Left 06/25/2018   Procedure: BREAST LUMPECTOMY WITH RADIOACTIVE SEED LOCALIZATION;  Surgeon: Erroll Luna, MD;  Location: [facility];  Service: General;  Laterality: Left;  . CARDIAC CATHETERIZATION    . CARDIOVERSION  08/29/2011   Procedure: CARDIOVERSION;  Surgeon: Bing Quarry, MD;  Location: [facility];  Service: Cardiovascular;  Laterality: N/A;  . CARDIOVERSION  11/22/2011   Procedure: CARDIOVERSION;  Surgeon: Coralyn Mark, MD;  Location: [facility];  Service: Cardiovascular;  Laterality: N/A;  . CHOLECYSTECTOMY  1989  . COLONOSCOPY W/ POLYPECTOMY  2005   Neg in 2010; Dr Olevia Perches  . ESOPHAGEAL DILATION     > 3 X; Dr Olevia Perches  . LEFT AND RIGHT HEART CATHETERIZATION WITH CORONARY ANGIOGRAM N/A 12/02/2011   Procedure: LEFT AND RIGHT HEART CATHETERIZATION WITH CORONARY ANGIOGRAM;  Surgeon: Jolaine Artist, MD;  Location: [facility];  Service: Cardiovascular;  Laterality: N/A;  . TOTAL ABDOMINAL HYSTERECTOMY  1972   for pain (no BSO)  . TUBAL LIGATION     with appendectomy  . UPPER GI ENDOSCOPY  2010   H pylori    Current Meds  Medication Sig  . alendronate (FOSAMAX) 70 MG tablet Take 1 tablet (70 mg total) by mouth every 7 (seven) days. Take with a full glass of  water on an empty stomach.  Marland Kitchen apixaban (ELIQUIS) 5 MG TABS tablet Take 1 tablet (5 mg total) by mouth 2 (two) times daily.  . flecainide (TAMBOCOR) 100 MG tablet TAKE 1 TABLET(100 MG) BY MOUTH TWICE DAILY  . ibuprofen (ADVIL,MOTRIN) 600 MG tablet Take 1 tablet (600 mg total) by mouth every 8 (eight) hours as needed.  Marland Kitchen letrozole (FEMARA) 2.5 MG tablet Take 1 tablet (2.5 mg total) by mouth daily.  . metoprolol tartrate (LOPRESSOR) 25 MG tablet  TAKE 1/2 TABLET(12.5 MG) BY MOUTH TWICE DAILY  . Multiple Vitamin (MULTIVITAMIN) tablet Take 1 tablet by mouth daily.  . Multiple Vitamins-Minerals (PRESERVISION AREDS 2+MULTI VIT PO) Take by mouth daily.  Marland Kitchen omeprazole (PRILOSEC) 40 MG capsule Take 1 capsule (40 mg total) by mouth 2 (two) times daily.  Marland Kitchen tiZANidine (ZANAFLEX) 4 MG tablet Take 4 mg by mouth 2 (two) times daily as needed.    Allergies: Penicillins; Codeine; Acetaminophen; Dabigatran etexilate mesylate; Sulfa antibiotics; and Sulfonamide derivatives  Social History   Tobacco Use  . Smoking status: Never Smoker  . Smokeless tobacco: Never Used  Substance Use Topics  . Alcohol use: No  . Drug use: No    Family History  Problem Relation Age of Onset  . Heart failure Mother   . Coronary artery disease Mother   . Diabetes Mother   . Osteoarthritis Father   . Coronary artery disease Father   . Prostate cancer Father        in 16s  . Breast cancer Sister        in 40's  . Diabetes Sister   . Esophageal cancer Brother        smoked  . Breast cancer Maternal Aunt     Review of Systems: A 12-system review of systems was performed and was negative except as noted in the HPI.  --------------------------------------------------------------------------------------------------  Physical Exam: BP 134/68   Pulse (!) 58   Ht 5\' 3"  (1.6 m)   Wt 172 lb (78 kg)   SpO2 94%   BMI 30.47 kg/m   General: NAD. HEENT: No conjunctival pallor or scleral icterus. Moist mucous membranes.  OP clear. Neck: Supple without lymphadenopathy, thyromegaly, JVD, or HJR. Lungs: Normal work of breathing. Clear to auscultation bilaterally without wheezes or crackles. Heart: Regular rate and rhythm without murmurs, rubs, or gallops. Non-displaced PMI. Abd: Bowel sounds present. Soft, NT/ND without hepatosplenomegaly Ext: Trace pretibial edema bilaterally. Radial, PT, and DP pulses are 2+ bilaterally. Skin: Warm and dry without rash.  EKG:  Normal sinus rhythm without abnormality.  Lab Results  Component Value Date   WBC 6.4 06/10/2018   HGB 12.3 06/10/2018   HCT 37.0 06/10/2018   MCV 88.9 06/10/2018   PLT 261 06/10/2018    Lab Results  Component Value Date   NA 143 06/10/2018   K 4.1 06/10/2018   CL 108 06/10/2018   CO2 25 06/10/2018   BUN 21 06/10/2018   CREATININE 0.95 06/10/2018   GLUCOSE 79 06/10/2018   ALT 15 06/10/2018    Lab Results  Component Value Date   CHOL 214 (H) 11/01/2016   HDL 53.50 11/01/2016   LDLCALC 127 (H) 11/01/2016   TRIG 164.0 (H) 11/01/2016   CHOLHDL 4 11/01/2016    --------------------------------------------------------------------------------------------------  ASSESSMENT AND PLAN: Shortness of breath and leg edema This could be due to a number of factors.  Bridget Lane has mild edema on exam today.  Her weight is stable over the last 4 months.  Echo a year ago showed preserved LVEF.  Myocardial perfusion stress test in January was low risk without significant ischemia or scar.  We have agreed to a trial of furosemide 20 mg daily, as her dyspnea and edema may reflect an element of diastolic heart failure.  We will defer additional testing at this time.  If symptoms persist, ambulatory cardiac monitoring to evaluate for paroxysmal atrial fibrillation underlying her symptoms may need to be considered.  Persistent atrial fibrillation Bridget Lane is in sinus rhythm today and has not had palpitations, though she previously was unaware of being in atrial fibrillation.  She seems to be tolerating metoprolol, flecainide, and apixaban well.  We will continue these medications.  As noted above, if symptoms do not improve with gentle diuresis, event monitor may be needed to exclude intermittent atrial fibrillation underlying her dyspnea and fatigue.  Nonobstructive coronary artery disease No chest pain noted.  Stress test in January was reassuring.  No further work-up at this time.  Follow-up:  Return to clinic in 1 month to see Bridget Dopp, Bridget Lane.  Long-term, I will have Bridget Lane follow-up with Bridget Lane at [place], given my transition to [facility].  Bridget Bush, MD 09/25/2018 7:33 AM

## 2018-09-24 NOTE — Patient Instructions (Signed)
Medication Instructions:  START LASIX (FUROSEMIDE) 20 mg DAILY  If you need a refill on your cardiac medications before your next appointment, please call your pharmacy.   Lab work: NONE If you have labs (blood work) drawn today and your tests are completely normal, you will receive your results only by: Marland Kitchen MyChart Message (if you have MyChart) OR . A paper copy in the mail If you have any lab test that is abnormal or we need to change your treatment, we will call you to review the results.  Testing/Procedures: NONE  Follow-Up: 1 MONTH WITH SCOTT WEAVER  At [facility], you and your health needs are our priority.  As part of our continuing mission to provide you with exceptional heart care, we have created designated Provider Care Teams.  These Care Teams include your primary Cardiologist (physician) and Advanced Practice Providers (APPs -  Physician Assistants and Nurse Practitioners) who all work together to provide you with the care you need, when you need it. .   Any Other Special Instructions Will Be Listed Below (If Applicable).

## 2018-09-25 ENCOUNTER — Encounter: Payer: Self-pay | Admitting: Internal Medicine

## 2018-10-05 ENCOUNTER — Telehealth: Payer: Self-pay | Admitting: Hematology and Oncology

## 2018-10-05 ENCOUNTER — Inpatient Hospital Stay: Payer: Medicare Other | Attending: Hematology and Oncology | Admitting: Hematology and Oncology

## 2018-10-05 DIAGNOSIS — Z7901 Long term (current) use of anticoagulants: Secondary | ICD-10-CM | POA: Insufficient documentation

## 2018-10-05 DIAGNOSIS — Z170 Estrogen receptor positive status [ER+]: Secondary | ICD-10-CM | POA: Insufficient documentation

## 2018-10-05 DIAGNOSIS — Z791 Long term (current) use of non-steroidal anti-inflammatories (NSAID): Secondary | ICD-10-CM | POA: Insufficient documentation

## 2018-10-05 DIAGNOSIS — M8580 Other specified disorders of bone density and structure, unspecified site: Secondary | ICD-10-CM | POA: Insufficient documentation

## 2018-10-05 DIAGNOSIS — Z79811 Long term (current) use of aromatase inhibitors: Secondary | ICD-10-CM | POA: Diagnosis not present

## 2018-10-05 DIAGNOSIS — Z79899 Other long term (current) drug therapy: Secondary | ICD-10-CM | POA: Insufficient documentation

## 2018-10-05 DIAGNOSIS — M2560 Stiffness of unspecified joint, not elsewhere classified: Secondary | ICD-10-CM | POA: Insufficient documentation

## 2018-10-05 DIAGNOSIS — C50512 Malignant neoplasm of lower-outer quadrant of left female breast: Secondary | ICD-10-CM | POA: Insufficient documentation

## 2018-10-05 MED ORDER — OMEPRAZOLE 40 MG PO CPDR: 40.0000 mg | DELAYED_RELEASE_CAPSULE | Freq: Every day | ORAL | 5 refills | Status: DC

## 2018-10-05 NOTE — Assessment & Plan Note (Signed)
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  Current treatment: Letrozole 2.5 mg daily started 07/02/2018 Osteopenia: T score -2.1 calcium and vitamin D Letrozole toxicities:  Return to clinic in 1 year for follow-up

## 2018-10-05 NOTE — Telephone Encounter (Signed)
Gave avs and calendar ° °

## 2018-10-05 NOTE — Progress Notes (Signed)
Patient Care Team: [name], MD as PCP - General (Family Medicine) End, Harrell Gave, MD as PCP - Cardiology (Cardiology) Erroll Luna, MD as Consulting Physician (General Surgery) [name], MD as Consulting Physician (Hematology and Oncology) Kyung Rudd, MD as Consulting Physician (Radiation Oncology)  DIAGNOSIS:  Encounter Diagnosis  Name Primary?  . 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, estrogen receptor positive ([place])     SUMMARY OF ONCOLOGIC HISTORY:   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, estrogen receptor positive ([place])   05/29/2018 Initial Diagnosis    Screening detected left breast calcifications 7 mm size axilla negative biopsy revealed grade 1 invasive ductal carcinoma with calcifications ER 100%, PR 90%, Ki-67 2%, HER-2 negative, T1 be N0 stage Ia AJCC 8    06/25/2018 Surgery    Left lumpectomy: IDC grade 1, 0.4 cm, DCIS low-grade, margins negative, left additional medial excision: DCIS low-grade, margins negative, ER 100%, PR 90%, HER-2 negative, Ki-67 2%, T1 a N0 stage Ia    07/06/2018 -  Anti-estrogen oral therapy    Letrozole 2.5 mg daily     CHIEF COMPLIANT: Follow-up on letrozole therapy  INTERVAL HISTORY: Bridget Lane is a 85-year with above-mentioned history left breast cancer treated with lumpectomy and is currently on letrozole therapy.  She is here for evaluation of side effects.  She does not have any hot flashes but she does have muscle stiffness and achiness.  It appears that the symptoms have gotten worse lately probably due to letrozole.  REVIEW OF SYSTEMS:   Constitutional: Denies fevers, chills or abnormal weight loss Eyes: Denies blurriness of vision Ears, nose, mouth, throat, and face: Denies mucositis or sore throat Respiratory: Denies cough, dyspnea or wheezes Cardiovascular: Denies palpitation, chest discomfort Gastrointestinal:  Denies nausea, heartburn or change  in bowel habits Skin: Denies abnormal skin rashes Lymphatics: Denies new lymphadenopathy or easy bruising Neurological:Denies numbness, tingling or new weaknesses Behavioral/Psych: Mood is stable, no new changes  Extremities: Joint stiffness and achiness  All other systems were reviewed with the patient and are negative.  I have reviewed the past medical history, past surgical history, social history and family history with the patient and they are unchanged from previous note.  ALLERGIES:  is allergic to penicillins; codeine; acetaminophen; dabigatran etexilate mesylate; sulfa antibiotics; and sulfonamide derivatives.  MEDICATIONS:  Current Outpatient Medications  Medication Sig Dispense Refill  . apixaban (ELIQUIS) 5 MG TABS tablet Take 1 tablet (5 mg total) by mouth 2 (two) times daily. 180 tablet 3  . flecainide (TAMBOCOR) 100 MG tablet TAKE 1 TABLET(100 MG) BY MOUTH TWICE DAILY 180 tablet 1  . furosemide (LASIX) 20 MG tablet Take 1 tablet (20 mg total) by mouth daily. 90 tablet 3  . ibuprofen (ADVIL,MOTRIN) 600 MG tablet Take 1 tablet (600 mg total) by mouth every 8 (eight) hours as needed. 30 tablet 2  . letrozole (FEMARA) 2.5 MG tablet Take 1 tablet (2.5 mg total) by mouth daily. 90 tablet 3  . metoprolol tartrate (LOPRESSOR) 25 MG tablet TAKE 1/2 TABLET(12.5 MG) BY MOUTH TWICE DAILY 30 tablet 6  . Multiple Vitamins-Minerals (PRESERVISION AREDS 2+MULTI VIT PO) Take by mouth daily.    Marland Kitchen omeprazole (PRILOSEC) 40 MG capsule Take 1 capsule (40 mg total) by mouth daily. 60 capsule 5  . tiZANidine (ZANAFLEX) 4 MG tablet Take 4 mg by mouth 2 (two) times daily as needed.  0   No current facility-administered medications for this visit.  PHYSICAL EXAMINATION: ECOG PERFORMANCE STATUS: 1 - Symptomatic but completely ambulatory  Vitals:   10/05/18 1135  BP: (!) 124/58  Pulse: (!) 57  Resp: 17  Temp: 97.9 F (36.6 C)  SpO2: 99%   Filed Weights   10/05/18 1135  Weight: 171 lb  3.2 oz (77.7 kg)    GENERAL:alert, no distress and comfortable SKIN: skin color, texture, turgor are normal, no rashes or significant lesions EYES: normal, Conjunctiva are pink and non-injected, sclera clear OROPHARYNX:no exudate, no erythema and lips, buccal mucosa, and tongue normal  NECK: supple, thyroid normal size, non-tender, without nodularity LYMPH:  no palpable lymphadenopathy in the cervical, axillary or inguinal LUNGS: clear to auscultation and percussion with normal breathing effort HEART: regular rate & rhythm and no murmurs and no lower extremity edema ABDOMEN:abdomen soft, non-tender and normal bowel sounds MUSCULOSKELETAL:no cyanosis of digits and no clubbing  NEURO: alert & oriented x 3 with fluent speech, no focal motor/sensory deficits EXTREMITIES: Joint stiffness and achiness    LABORATORY DATA:  I have reviewed the data as listed CMP Latest Ref Rng & Units 06/10/2018 05/12/2018 03/06/2018  Glucose 70 - 99 mg/dL 79 100(H) 73  BUN 8 - 23 mg/dL '21 17 27  '$ Creatinine 0.44 - 1.00 mg/dL 0.95 1.07 1.07(H)  Sodium 135 - 145 mmol/L 143 141 141  Potassium 3.5 - 5.1 mmol/L 4.1 4.4 4.8  Chloride 98 - 111 mmol/L 108 105 104  CO2 22 - 32 mmol/L '25 28 21  '$ Calcium 8.9 - 10.3 mg/dL 9.1 9.3 9.6  Total Protein 6.5 - 8.1 g/dL 7.3 6.9 -  Total Bilirubin 0.3 - 1.2 mg/dL 0.7 0.5 -  Alkaline Phos 38 - 126 U/L 86 74 -  AST 15 - 41 U/L 19 16 -  ALT 0 - 44 U/L 15 16 -    Lab Results  Component Value Date   WBC 6.4 06/10/2018   HGB 12.3 06/10/2018   HCT 37.0 06/10/2018   MCV 88.9 06/10/2018   PLT 261 06/10/2018   NEUTROABS 3.7 06/10/2018    ASSESSMENT & PLAN:  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, estrogen receptor positive ([place]) 06/25/2018: Left lumpectomy: IDC grade 1, 0.4 cm, DCIS low-grade, margins negative, left additional medial excision: DCIS low-grade, margins negative, ER 100%, PR 90%, HER-2 negative, Ki-67 2%, T1 a N0 stage Ia Radiation  oncology felt that adjuvant radiation is not necessary given her age and favorable prognostic findings.  Current treatment: Letrozole 2.5 mg daily started 07/02/2018 Osteopenia: T score -2.1 calcium and vitamin D Letrozole toxicities: 1.  Joint stiffness and achiness I discussed with her about switching her letrozole to bedtime and see if it makes any difference.  The other option would be to stop letrozole for a week and see if the aches and pains get better.  If they do get better we can consider substituting letrozole with anastrozole.  Patient has osteopenia and could not tolerate bisphosphonate therapy with Fosamax.  Return to clinic in 1 year for follow-up  No orders of the defined types were placed in this encounter.  The patient has a good understanding of the overall plan. she agrees with it. she will call with any problems that may develop before the next visit here.   Harriette Ohara, MD 10/05/18

## 2018-10-28 ENCOUNTER — Ambulatory Visit: Payer: Medicare Other | Admitting: Physician Assistant

## 2018-10-28 ENCOUNTER — Encounter: Payer: Self-pay | Admitting: Physician Assistant

## 2018-10-28 VITALS — BP 136/62 | HR 56 | Ht 63.0 in | Wt 176.4 lb

## 2018-10-28 DIAGNOSIS — I480 Paroxysmal atrial fibrillation: Secondary | ICD-10-CM | POA: Diagnosis not present

## 2018-10-28 DIAGNOSIS — R0602 Shortness of breath: Secondary | ICD-10-CM

## 2018-10-28 NOTE — Progress Notes (Signed)
Cardiology Office Note:    Date:  10/28/2018   ID:  Bridget Lane, DOB 01-27-1933, MRN [id number]  PCP:  [name], MD  Cardiologist:  Nelva Bush, MD >> will est with Dr. Acie Fredrickson Electrophysiologist:  None   Referring MD: [name], MD   Chief Complaint  Patient presents with  . Follow-up    shortness of breath    History of Present Illness:    Bridget Lane is a 85 y.o. female with paroxysmal atrial fibrillation managed with Flecainide and Apixaban, hyperlipidemia, breast CA.  She was previously followed by Dr. Aundra Dubin.  She had recurrent AFib and was referred to hte AFib clinic.  She was back in [place] when she went for her appt.  She last seen Dr. Saunders Revel in 09/2018.   She complained of shortness of breath and she was placed on a trial of Furosemide.    Bridget Lane returns for follow-up.  She does feel that her breathing is easier on the low-dose of Lasix.  She does have some atypical discomfort in her left chest.  She thought that this was related to her breast cancer.  It has been constant for quite some time.  She denies exertional symptoms.  She denies syncope, parous or nocturnal dyspnea or significant lower extremity swelling.  Prior CV studies:   The following studies were reviewed today:  Nuclear stress test 10/27/2017 EF 55, apical lateral and apical defect likely breast attenuation, no ischemia; low risk  Echocardiogram 10/13/2017 EF 55-60, normal wall motion, trivial AI, mild MR, PASP 33  Cardiac catheterization 12/02/2011 LM normal LAD proximal 30-40 followed by 30; mid intramyocardial bridge LCx ostial minimal plaque RCA proximal minimal plaque EF 55  Past Medical History:  Diagnosis Date  . Atrial fibrillation ([place])    persistent  . Colon polyp 2005  . Depression   . DJD (degenerative joint disease)   . Dysrhythmia   . Esophageal stricture   . Family history of breast cancer   . Family history of esophageal cancer   . Family history of  prostate cancer   . GERD (gastroesophageal reflux disease)   . H. pylori infection   . Hiatal hernia   . Hyperlipidemia   . Macular degeneration   . Nontoxic multinodular goiter   . OA (osteoarthritis)   . Postherpetic neuralgia at T3-T5 level 04/27/2011  . Rectal bleeding 07/06/2014   Hemorrhoid related in past.     Surgical Hx: The patient  has a past surgical history that includes Appendectomy; Tubal ligation; Cholecystectomy (1989); Total abdominal hysterectomy (1972); Esophageal dilation; Colonoscopy w/ polypectomy (2005); Cardioversion (08/29/2011); Cardioversion (11/22/2011); Upper gi endoscopy (2010); left and right heart catheterization with coronary angiogram (N/A, 12/02/2011); Cardiac catheterization; Anterior cervical decomp/discectomy fusion (N/A, 05/29/2015); and Breast lumpectomy with radioactive seed localization (Left, 06/25/2018).   Current Medications: Current Meds  Medication Sig  . apixaban (ELIQUIS) 5 MG TABS tablet Take 1 tablet (5 mg total) by mouth 2 (two) times daily.  . flecainide (TAMBOCOR) 100 MG tablet TAKE 1 TABLET(100 MG) BY MOUTH TWICE DAILY  . furosemide (LASIX) 20 MG tablet Take 1 tablet (20 mg total) by mouth daily.  Marland Kitchen ibuprofen (ADVIL,MOTRIN) 600 MG tablet Take 1 tablet (600 mg total) by mouth every 8 (eight) hours as needed.  Marland Kitchen letrozole (FEMARA) 2.5 MG tablet Take 1 tablet (2.5 mg total) by mouth daily.  . metoprolol tartrate (LOPRESSOR) 25 MG tablet TAKE 1/2 TABLET(12.5 MG) BY MOUTH TWICE DAILY  . Multiple  Vitamins-Minerals (PRESERVISION AREDS 2+MULTI VIT PO) Take by mouth daily.  Marland Kitchen omeprazole (PRILOSEC) 40 MG capsule Take 1 capsule (40 mg total) by mouth daily.  Marland Kitchen tiZANidine (ZANAFLEX) 4 MG tablet Take 4 mg by mouth 2 (two) times daily as needed.     Allergies:   Penicillins; Codeine; Acetaminophen; Dabigatran etexilate mesylate; Dabigatran etexilate mesylate; Sulfa antibiotics; and Sulfonamide derivatives   Social History   Tobacco Use  . Smoking  status: Never Smoker  . Smokeless tobacco: Never Used  Substance Use Topics  . Alcohol use: No  . Drug use: No     Family Hx: The patient's family history includes Breast cancer in her maternal aunt and sister; Coronary artery disease in her father and mother; Diabetes in her mother and sister; Esophageal cancer in her brother; Heart failure in her mother; Osteoarthritis in her father; Prostate cancer in her father.  ROS:   Please see the history of present illness.    ROS All other systems reviewed and are negative.   EKGs/Labs/Other Test Reviewed:    EKG:  EKG is  ordered today.  The ekg ordered today demonstrates sinus bradycardia, heart rate 56, normal axis, QTC 438, no change from prior tracing  Recent Labs: 06/10/2018: ALT 15; BUN 21; Creatinine 0.95; Hemoglobin 12.3; Platelet Count 261; Potassium 4.1; Sodium 143   Recent Lipid Panel Lab Results  Component Value Date/Time   CHOL 214 (H) 11/01/2016 09:55 AM   TRIG 164.0 (H) 11/01/2016 09:55 AM   HDL 53.50 11/01/2016 09:55 AM   CHOLHDL 4 11/01/2016 09:55 AM   LDLCALC 127 (H) 11/01/2016 09:55 AM    Physical Exam:    VS:  BP 136/62   Pulse (!) 56   Ht 5\' 3"  (1.6 m)   Wt 176 lb 6.4 oz (80 kg)   BMI 31.25 kg/m     Wt Readings from Last 3 Encounters:  10/28/18 176 lb 6.4 oz (80 kg)  10/05/18 171 lb 3.2 oz (77.7 kg)  09/24/18 172 lb (78 kg)     Physical Exam  Constitutional: She is oriented to person, place, and time. She appears well-developed and well-nourished. No distress.  HENT:  Head: Normocephalic and atraumatic.  Neck: Neck supple. No JVD present.  Cardiovascular: Normal rate, regular rhythm, S1 normal, S2 normal and normal heart sounds.  No murmur heard. Pulmonary/Chest: Effort normal. She has no rales.  Abdominal: Soft. There is no hepatomegaly.  Musculoskeletal:        General: No edema.  Neurological: She is alert and oriented to person, place, and time.  Skin: Skin is warm and dry.    ASSESSMENT  & PLAN:    Shortness of breath  EF 55-60 by echo in 12/18.  She was placed on furosemide at our last visit in December.  She has noted some improvement since that time with her breathing.  Volume status appears stable on exam.  Obtain follow-up BMET today.  Continue current dose of furosemide.  Paroxysmal atrial fibrillation (H[facility])  Maintaining normal sinus rhythm.  CHADS2-VASc=3 (female, age x 2).  Creatinine and hemoglobin were normal in August 2019.  Continue current dose of Apixaban.  Dr. Saunders Revel recommended follow up with Dr. Harrell Gave given his transition to [facility].  However, she prefers to come to [facility].  I will see her along with Dr. Acie Fredrickson going forward.    Dispo:  Return in about 6 months (around 04/28/2019) for Routine Follow Up, w/ Dr. Acie Fredrickson, or Richardson Dopp, PA-C.   Medication  Adjustments/Labs and Tests Ordered: Current medicines are reviewed at length with the patient today.  Concerns regarding medicines are outlined above.  Tests Ordered: Orders Placed This Encounter  Procedures  . Basic metabolic panel  . EKG 12-Lead   Medication Changes: No orders of the defined types were placed in this encounter.   Signed, Richardson Dopp, PA-C  10/28/2018 2:40 PM    [facility] [address], [place], [place]  [address] Phone: [phone]; Fax: [phone]

## 2018-10-28 NOTE — Patient Instructions (Addendum)
Medication Instructions:  Your physician recommends that you continue on your current medications as directed. Please refer to the Current Medication list given to you today.  If you need a refill on your cardiac medications before your next appointment, please call your pharmacy.   Lab work: TODAY: BMET  If you have labs (blood work) drawn today and your tests are completely normal, you will receive your results only by: Marland Kitchen MyChart Message (if you have MyChart) OR . A paper copy in the mail If you have any lab test that is abnormal or we need to change your treatment, we will call you to review the results.  Testing/Procedures: NONE  Follow-Up: At [facility], you and your health needs are our priority.  As part of our continuing mission to provide you with exceptional heart care, we have created designated Provider Care Teams.  These Care Teams include your primary Cardiologist (physician) and Advanced Practice Providers (APPs -  Physician Assistants and Nurse Practitioners) who all work together to provide you with the care you need, when you need it. You will need a follow up appointment in:  6 months.  Please call our office 2 months in advance to schedule this appointment.  You may see DR. [name] or one of the following Advanced Practice Providers on your designated Care Team: Richardson Dopp, PA-C [place], [place] . Daune Perch, NP  Any Other Special Instructions Will Be Listed Below (If Applicable).

## 2018-10-29 LAB — BASIC METABOLIC PANEL WITH GFR
BUN/Creatinine Ratio: 23 (ref 12–28)
BUN: 25 mg/dL (ref 8–27)
CO2: 23 mmol/L (ref 20–29)
Calcium: 9.4 mg/dL (ref 8.7–10.3)
Chloride: 108 mmol/L — ABNORMAL HIGH (ref 96–106)
Creatinine, Ser: 1.07 mg/dL — ABNORMAL HIGH (ref 0.57–1.00)
GFR calc Af Amer: 55 mL/min/{1.73_m2} — ABNORMAL LOW
GFR calc non Af Amer: 48 mL/min/{1.73_m2} — ABNORMAL LOW
Glucose: 98 mg/dL (ref 65–99)
Potassium: 4.8 mmol/L (ref 3.5–5.2)
Sodium: 143 mmol/L (ref 134–144)

## 2018-10-30 ENCOUNTER — Ambulatory Visit (INDEPENDENT_AMBULATORY_CARE_PROVIDER_SITE_OTHER): Payer: Medicare Other

## 2018-10-30 ENCOUNTER — Ambulatory Visit (INDEPENDENT_AMBULATORY_CARE_PROVIDER_SITE_OTHER): Payer: Medicare Other | Admitting: Family Medicine

## 2018-10-30 ENCOUNTER — Encounter: Payer: Self-pay | Admitting: Family Medicine

## 2018-10-30 ENCOUNTER — Ambulatory Visit: Payer: Self-pay

## 2018-10-30 VITALS — BP 126/66 | HR 87 | Temp 79.6°F | Ht 63.0 in | Wt 173.4 lb

## 2018-10-30 DIAGNOSIS — M810 Age-related osteoporosis without current pathological fracture: Secondary | ICD-10-CM | POA: Diagnosis not present

## 2018-10-30 DIAGNOSIS — K219 Gastro-esophageal reflux disease without esophagitis: Secondary | ICD-10-CM

## 2018-10-30 DIAGNOSIS — M25562 Pain in left knee: Secondary | ICD-10-CM

## 2018-10-30 DIAGNOSIS — M25552 Pain in left hip: Secondary | ICD-10-CM

## 2018-10-30 DIAGNOSIS — S8991XA Unspecified injury of right lower leg, initial encounter: Secondary | ICD-10-CM | POA: Diagnosis not present

## 2018-10-30 DIAGNOSIS — M25561 Pain in right knee: Secondary | ICD-10-CM

## 2018-10-30 DIAGNOSIS — S79912A Unspecified injury of left hip, initial encounter: Secondary | ICD-10-CM | POA: Diagnosis not present

## 2018-10-30 NOTE — Progress Notes (Signed)
Subjective:  Bridget Lane is a 85 y.o. year old very pleasant female patient who presents for/with See problem oriented charting ROS-patient had a fall earlier this week-complains of left hip, left knee, right knee pain.  No fever or chills.  Some bruising over her left knee.  Did not hit her head.  Did not lose consciousness.  Past Medical History-  Patient Active Problem List   Diagnosis Date Noted  . 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, estrogen receptor positive ([place]) 06/03/2018    Priority: High  . Atrial fibrillation 06/14/2011    Priority: High  . Insomnia 08/25/2015    Priority: Medium  . Spinal stenosis in cervical region 05/29/2015    Priority: Medium  . Hyperglycemia 06/07/2014    Priority: Medium  . Hyperlipidemia 01/28/2008    Priority: Medium  . Cervical lymphadenopathy 05/16/2016    Priority: Low  . History of Helicobacter pylori infection 07/06/2014    Priority: Low  . OSA (obstructive sleep apnea) 02/11/2012    Priority: Low  . Long term (current) use of anticoagulants 07/19/2011    Priority: Low  . Chest pain 06/14/2011    Priority: Low  . Depression 01/30/2010    Priority: Low  . History of colonic polyps 01/30/2010    Priority: Low  . ESOPHAGEAL STRICTURE 07/20/2008    Priority: Low  . Osteoarthritis 07/20/2008    Priority: Low  . Multiple thyroid nodules 01/28/2008    Priority: Low  . GERD 01/28/2008    Priority: Low  . Osteoporosis 10/30/2018  . Genetic testing 07/02/2018  . Dyspnea on exertion 03/23/2018  . Constipation 08/29/2017    Medications- reviewed and updated Current Outpatient Medications  Medication Sig Dispense Refill  . apixaban (ELIQUIS) 5 MG TABS tablet Take 1 tablet (5 mg total) by mouth 2 (two) times daily. 180 tablet 3  . flecainide (TAMBOCOR) 100 MG tablet TAKE 1 TABLET(100 MG) BY MOUTH TWICE DAILY 180 tablet 1  . furosemide (LASIX) 20 MG tablet Take 1 tablet (20 mg total) by mouth daily.  90 tablet 3  . ibuprofen (ADVIL,MOTRIN) 600 MG tablet Take 1 tablet (600 mg total) by mouth every 8 (eight) hours as needed. 30 tablet 2  . letrozole (FEMARA) 2.5 MG tablet Take 1 tablet (2.5 mg total) by mouth daily. 90 tablet 3  . metoprolol tartrate (LOPRESSOR) 25 MG tablet TAKE 1/2 TABLET(12.5 MG) BY MOUTH TWICE DAILY 30 tablet 6  . Multiple Vitamins-Minerals (PRESERVISION AREDS 2+MULTI VIT PO) Take by mouth daily.    Marland Kitchen omeprazole (PRILOSEC) 40 MG capsule Take 1 capsule (40 mg total) by mouth daily. 60 capsule 5  . tiZANidine (ZANAFLEX) 4 MG tablet Take 4 mg by mouth 2 (two) times daily as needed.  0   No current facility-administered medications for this visit.     Objective: BP 126/66 (BP Location: Left Arm, Patient Position: Sitting, Cuff Size: Large)   Pulse 87   Temp (!) 79.6 F (26.4 C) (Oral)   Ht 5\' 3"  (1.6 m)   Wt 173 lb 6.4 oz (78.7 kg)   SpO2 96%   BMI 30.72 kg/m  Gen: NAD, resting comfortably CV: RRR no murmurs rubs or gallops Lungs: CTAB no crackles, wheeze, rhonchi Ext: no edema Skin: warm, dry MSK- right knee with medial and lateral joint line tenderness without effusion.  Left knee with slight bruising over patella- medial and lateral joint line tenderness with slight effusion.  Some groin pain with external rotation  of the hip.  Otherwise reasonable range of motion of the hip without pain.  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 Heel got hung up in cement hole and could not get foot easily out and then fell onto left side- scraped her hand and thought she just bruised herself up.  She was having left hip and knee pain (tried some ibuprofen)-then yesterday symptoms significantly worsen while out doing errands.  She had to start using a cane.  Very painful with weightbearing.  She noted some bruising over her left knee.  She denies deformity.  She is on Eliquis at  baseline-did not hit her head. Right knee has also been having some pain with this- was worried about fracture. Has tried icing areas with little relief. Heating pad helped some.   Pain is pretty bad today.  A/P: We completed films on her left hip, right knee, left knee- I independently reviewed each of these films and noted arthritis on each but no obvious fracture-we will await radiology overread.  We discussed conservative care of these injuries-can use Tylenol arthritis-seems like she needs a longer acting medicine as she is forgetting to take Advil and then pain is worsening.  In addition I do not really want her on Advil because of her Eliquis-so I discouraged all NSAIDs  In regards to her fall- we discussed being particularly careful when navigating unfamiliar terrain's-she may need to consider a cane if future issues.  Fall education completed  Osteoporosis S:July 2019--2.1 at the lumbar spine.  -1.2 at bilateral hip-but had elevated hip fracture risk over 3% threshold-attempted Fosamax July 2019 but had bad myalgias and had to stop.  She is compliant with calcium and vitamin D A/P: Patient has osteoporosis based on elevated hip fracture risk and prior trial of medication.  Failed Fosamax due to myalgias.  Repeat bone density in 2 years-consider referral to endocrine or trial of Prolia potentially.  Hesitant to retrial bisphosphonate due to severity of myalgias.  Discussed reducing or stopping PPI if possible but she has breakthrough symptoms even on high-dose omeprazole though she has been able to come down from twice a day.  Next bone density July 2021  GERD S: Patient had to be on twice daily PPI in the past.  Currently she is tolerating omeprazole 40 mg once a day.  She still gets occasional breakthrough of symptoms A/P: Stable-would love to reduce dose of PPI given osteoporosis history but patient has been unable to tolerate-continue current medications  Future Appointments  Date  Time Provider Freder[place]  01/01/2019 10:00 AM Causey, Charlestine Massed, NP [facility] None  04/06/2019 11:15 AM [name], MD [facility] None    Lab/Order associations: Left hip pain - Plan: DG HIP UNILAT WITH PELVIS 2-3 VIEWS LEFT  Acute pain of left knee - Plan: DG Knee Complete 4 Views Left  Acute pain of right knee - Plan: DG Knee AP/LAT W/Sunrise Right  Age-related osteoporosis without current pathological fracture  Gastroesophageal reflux disease, esophagitis presence not specified  Return precautions advised.  Garret Reddish, MD

## 2018-10-30 NOTE — Telephone Encounter (Signed)
See note

## 2018-10-30 NOTE — Patient Instructions (Addendum)
I do not see an obvious fracture- we will await radiology overread  Could consider tylenol arthritis - which is a longer acting version- can take every 8 hours. Avoid ibuprofen/aleve/motrin/advil due to eliquis. Can continue heat or icing- whichever is more beneficial. Even though there are no obvious fractures- you may have tweaked muscle groups or bruised bones

## 2018-10-30 NOTE — Assessment & Plan Note (Signed)
S:July 2019--2.1 at the lumbar spine.  -1.2 at bilateral hip-but had elevated hip fracture risk over 3% threshold-attempted Fosamax July 2019 but had bad myalgias and had to stop.  She is compliant with calcium and vitamin D A/P: Patient has osteoporosis based on elevated hip fracture risk and prior trial of medication.  Failed Fosamax due to myalgias.  Repeat bone density in 2 years-consider referral to endocrine or trial of Prolia potentially.  Hesitant to retrial bisphosphonate due to severity of myalgias.  Discussed reducing or stopping PPI if possible but she has breakthrough symptoms even on high-dose omeprazole though she has been able to come down from twice a day.  Next bone density July 2021

## 2018-10-30 NOTE — Telephone Encounter (Signed)
Pt. Reports she fell this past Sunday in the [place] parking [place]. Her left knee and hip were "hurting, but not too bad." Reports "yesterday it was a lot worse because I was out all day doing errands." Using a cane . Very painful with weight bearing. Left knee is bruised.No deformity. On Eliquis. Driving without difficulty. Pain goes up her leg and into her hip and groin area. Please advise pt. Contact number [phone].Instructed by agent that Dr. Yong Channel has no availability.  Reason for Disposition . [1] High-risk adult (e.g., age > 59, osteoporosis, chronic steroid use) AND [2] limping  Answer Assessment - Initial Assessment Questions 1. MECHANISM: "How did the injury happen?" (e.g., twisting injury, direct blow)      [address] in a parking lot last Sunday 2. ONSET: "When did the injury happen?" (Minutes or hours ago)      This past Sunday 3. LOCATION: "Where is the injury located?"       Left hip - front of hip at the groin hurts 4. APPEARANCE of INJURY: "What does the injury look like?"  (e.g., deformity of leg)     No deformity 5. SEVERITY: "Can you put weight on that leg?" "Can you walk?"      Yes, but it is painful. 6. SIZE: For cuts, bruises, or swelling, ask: "How large is it?" (e.g., inches or centimeters;  entire joint)      No cuts 7. PAIN: "Is there pain?" If so, ask: "How bad is the pain?"  (e.g., Scale 1-10; or mild, moderate, severe)     8 8. TETANUS: For any breaks in the skin, ask: "When was the last tetanus booster?"     Unsure 9. OTHER SYMPTOMS: "Do you have any other symptoms?"      No 10. PREGNANCY: "Is there any chance you are pregnant?" "When was your last menstrual period?"       n/a  Protocols used: HIP INJURY-A-AH

## 2018-10-30 NOTE — Telephone Encounter (Signed)
Called and spoke with patient. I am putting her on the schedule for 4:40 today with Dr. Yong Channel. She will arrive at 4:20 today for an xray prior to seeing Dr. Yong Channel

## 2018-10-30 NOTE — Assessment & Plan Note (Signed)
S: Patient had to be on twice daily PPI in the past.  Currently she is tolerating omeprazole 40 mg once a day.  She still gets occasional breakthrough of symptoms A/P: Stable-would love to reduce dose of PPI given osteoporosis history but patient has been unable to tolerate-continue current medications

## 2018-11-23 ENCOUNTER — Telehealth: Payer: Self-pay

## 2018-11-23 ENCOUNTER — Other Ambulatory Visit: Payer: Self-pay | Admitting: Hematology and Oncology

## 2018-11-23 ENCOUNTER — Other Ambulatory Visit: Payer: Self-pay

## 2018-11-23 MED ORDER — ANASTROZOLE 1 MG PO TABS: ORAL_TABLET | ORAL | 0 refills | Status: DC

## 2018-11-23 NOTE — Telephone Encounter (Signed)
Returned pt call with call back number regarding changing her prescription for hormone therapy.

## 2018-11-23 NOTE — Telephone Encounter (Signed)
error 

## 2018-11-23 NOTE — Progress Notes (Signed)
Pt called stating that she was unable to tolerate letrozole the second time she restarted the medication. Pt unable to tolerate joint and aches in her body. Would be interested in trying anastrozole if available. Per Dr.Gudena's last note, okay to switch if pt unable to tolerate letrozole po. Suggested that pt take anastrozole every other night for 1 week and increase to daily at bedtime afterwards. Pt to call back to report how she is doing with new medication. Confirmed pt is taking calcium with vitamin d supplements, since she was unable to tolerate fosamax.

## 2018-12-24 ENCOUNTER — Encounter: Payer: Self-pay | Admitting: Sports Medicine

## 2018-12-24 ENCOUNTER — Ambulatory Visit: Payer: Medicare Other | Admitting: Sports Medicine

## 2018-12-24 VITALS — BP 124/62 | HR 102 | Wt 173.0 lb

## 2018-12-24 DIAGNOSIS — M25552 Pain in left hip: Secondary | ICD-10-CM

## 2018-12-24 DIAGNOSIS — M25561 Pain in right knee: Secondary | ICD-10-CM | POA: Diagnosis not present

## 2018-12-24 DIAGNOSIS — M25562 Pain in left knee: Secondary | ICD-10-CM | POA: Diagnosis not present

## 2018-12-24 DIAGNOSIS — R269 Unspecified abnormalities of gait and mobility: Secondary | ICD-10-CM | POA: Diagnosis not present

## 2018-12-24 MED ORDER — PREDNISONE 20 MG PO TABS: ORAL_TABLET | ORAL | 0 refills | Status: DC

## 2018-12-24 NOTE — Progress Notes (Signed)
Juanda Bond. Raniyah Curenton, [place] [facility] [place] - 85 y.o. female MRN [id number]  Date of birth: 12/04/1932  Visit Date: December 27, 2018  PCP: [name], MD   Referred by: [name], MD  SUBJECTIVE:  Chief Complaint  Patient presents with  . New Patient (Initial Visit)    pain in left hip and thigh and L knee pain     HPI: Patient presents after a fall in January she tripped and landed on her knees.  She has been having worsening pain since that time and describes it currently as moderate in the left hip.  It has been worsening.  It does radiate from her hip into her left leg and thigh.  Heat is not helpful.  Ice was mildly helpful.  Going from sit to stand and getting up to walk it is moderately painful.  REVIEW OF SYSTEMS: Reports, Left knee associated night time disturbances.  Denies fevers, chills, or night sweats. Denies unexplained weight loss. Reports, Breast cancer personal history of cancer. Denies changes in bowel or bladder habits. Denies recent unreported falls. Denies new or worsening dyspnea or wheezing. Denies headaches or dizziness.  Denies numbness, tingling in the extremities.  Denies dizziness or presyncopal episodes Reports lower extremity edema   HISTORY:  Prior history reviewed and updated per electronic medical record.  Patient Active Problem List   Diagnosis Date Noted  . Osteoporosis 10/30/2018    July 2019--2.1 at the lumbar spine.  -1.2 at bilateral hip-but had elevated hip fracture risk over 3% threshold-attempted Fosamax July 2019 but had bad myalgias and had to stop.   . Genetic testing 07/02/2018    Declined genetic testing 07/02/18. Will speak with daughters and call back if she changes her mind.    . 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, estrogen receptor positive ([place]) 06/03/2018  . Dyspnea on exertion 03/23/2018  . Constipation  08/29/2017  . Cervical lymphadenopathy 05/16/2016  . Insomnia 08/25/2015    Didn't feel likeherself on ambien but uses sparingly.  Neurosurgeon put her on muscle relaxer for sleep   . Spinal stenosis in cervical region 05/29/2015  . History of Helicobacter pylori infection 07/06/2014  . Hyperglycemia 06/07/2014    DM: Mother, sister, Jerilynn Mages aunts X107 Lab Results  Component Value Date   HGBA1C 6.0 06/07/2014      . OSA (obstructive sleep apnea) 02/11/2012    Minimal- no CPAP  PHYSICIAN: Kathee Delton, MD,FCCPDATE OF BIRTH: 12-28-1932  DATE OF STUDY: 12/05/2011  NOCTURNAL POLYSOMNOGRAM  REFERRING PHYSICIAN: Thompson Grayer, MD  REFERRING PHYSICIAN: Thompson Grayer, MD  INDICATION FOR STUDY: Hypersomnia with sleep apnea.  EPWORTH SLEEPINESS SCORE: 10.  SLEEP ARCHITECTURE: The patient had a total sleep time of 327 minutes  with no slow-wave sleep and only 34 minutes of REM. Sleep onset latency  was normal at 5.5 minutes and REM onset was prolonged at 147 minutes.  Sleep efficiency was mildly reduced at 83%.  RESPIRATORY DATA: The patient was found to have 12 apneas and 13  obstructive hypopneas, giving her an apnea/hypopnea index of only 5  events per hour. The events occurred in all body positions and there  was moderate snoring noted throughout. The patient did not meet split  night protocol secondary to her small numbers of events.  OXYGEN DATA: There was transient O2 desaturation as low as 83% during  the night.  CARDIAC DATA:  No clinically significant arrhythmias were noted.  MOVEMENT/PARASOMNIA: The patient was found to have very large numbers  of leg jerks with 7 per hour, resulting in arousal or awakening. There  were no abnormal behavior seen.  IMPRESSION/RECOMMENDATION:  1. Minimal obstructive sleep apnea/hypopnea syndrome with an AHI of 5  events per hour and O2 desaturation transiently as low as 83%. The  patient did not meet split night criteria secondary to her very  small  numbers of events. Treatment for this should focus primarily  on modest weight loss, however, a more aggressive approach could be  considered, which may include surgery, dental appliance, or CPAP.  Clinical correlation is suggested.  2. Very large numbers of leg jerks with significant sleep disruption.  This is very suspicious for a primary  movement disorder of sleep such as the restless legs syndrome.  This may be more of a disruption to sleep than her minimal sleep-  disordered breathing. Again, clinical correlation is suggested.  Kathee Delton, MD,FCCP  [place], [place] Board of Sleep  Medicine  [facility]MED[facility] D: 12/23/2011 08:30:26 T: 12/23/2011 23:40:03 Job: [id number]    . Long term (current) use of anticoagulants 07/19/2011  . Atrial fibrillation 06/14/2011    Dr. Aundra Dubin. Eliquis, flecainide, metoprolol 12.5mg  BID  July 2012 Echo study   Study Conclusions    - Left ventricle: The cavity size was normal. Wall thickness was     normal. Systolic function was normal. The estimated ejection     fraction was in the range of 55% to 60%.   - Mitral valve: Mild regurgitation.   - Left atrium: The atrium was mildly dilated.  Nuclear Cardiology  9/5/201Stress ECG: There are no significant ECG changes from baseline.  QPS  Raw Data Images: Normal; no motion artifact; normal heart/lung ratio.  Stress Images: Normal homogeneous uptake in all areas of the myocardium.  Rest Images: Normal homogeneous uptake in all areas of the myocardium.  Subtraction (SDS): No evidence of ischemia.  Transient Ischemic Dilatation (Normal <1.22): .97  Lung/Heart Ratio (Normal <0.45): .33  Quantitative Gated Spect Images  QGS EDV: 77 ml  QGS ESV: 30 ml  QGS cine images: NL LV Function; NL Wall Motion  QGS EF: 61%  Impression  Exercise Capacity: Lexiscan with low level exercise.  BP Response: Normal blood pressure response.  Clinical Symptoms: No chest pain.  ECG Impression: No significant ST segment  change suggestive of ischemia.  Comparison with Prior Nuclear Study: No images to compare  Overall Impression: Normal stress nuclear study. No evidence of ischemia. Normal LV function.  Thayer Headings, Brooke Bonito., MD, [facility]    2     - Right ventricle: RV appears dilated in 4 chamber view but less so     in subcostal image provided The cavity size was moderately     dilated.   - Right atrium: The atrium was moderately dilated.   - Atrial septum: No defect or patent foramen ovale was identified.   . Chest pain 06/14/2011    Findings:  RA = 4  RV = 29/6/8  PA = 28/7 (16)  PCW = 7  Fick cardiac output/index = 5.2/2.8  PVR = 1.7 [place]  FA sat = 96%  PA sat = 67%, 61%, 70%  Ao Pressure: 119/51 (77)  LV Pressure: 120/0/8  Findings: Technically adequate study with good opacification of the central and segmental pulmonary arteries. No focal filling defects demonstrated. No evidence of significant pulmonary embolus. Normal caliber thoracic aorta. No  significant lymphadenopathy in the chest. No pleural effusions. Dependent atelectasis in the lungs. No focal airspace consolidation. Degenerative changes in the thoracic spine. 4 mm nodular opacity demonstrated on previous chest x-ray is not visualized on CT and may represent artifact.  Review of the MIP images confirms the above findings.  IMPRESSION: No evidence of significant pulmonary embolus.  Original Report Authenticated By: Neale Burly, M.D.       Last Resulted: 04/30/11 10:38 PM    There was no signficant gradient across the aortic valve on pullback.  Left main: Normal  LAD: 30-40% prox followed by 30%. 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 Minimal plaque proximally. Otherwise normal.  LV-gram done in the RAO projection: Ejection fraction = 55%. No wall motion abnormalities/  Assessment:  1. Mild CAD  2. Normal LV function  3. Normal right sided pressures  4.  Myocardial bridging section with no evidence of flow limitation  Plan/Discussion:  Essentially normal cath. No findings to explain exertional dyspnea and CP. Will review echo. If symptoms persist will follow-up with PFTs +/- chest CT.  Glori Bickers, MD  8:16 AM       . Depression 01/30/2010    Untreated. DId not tolerate SSRI.    Marland Kitchen History of colonic polyps 01/30/2010    COlonoscopy 2015-adenomas noted. ? Repeat 2020 at age 73.   Colon polypectomy 2005  Last colonoscopy 06/30/2009 revealed only internal hemorrhoids; Dr. Delfin Edis        . ESOPHAGEAL STRICTURE 07/20/2008    S/P dilation X 4;Dr Olevia Perches    . Osteoarthritis 07/20/2008    Hands, back. Still able to walk.     . Multiple thyroid nodules 01/28/2008    2009: 1 x 1 x 1 cm calcified nodule in the left lobe of the thyroid 08/03/2013 : fine needle aspiration of thyroid nodules left lobe (9 x 14 x 16 mm and 6 x 10 x 16 mm). Aspiration consisted almost entirely of lymphocytes No PMH radiation to face/neck Sister hypothyroid   . Hyperlipidemia 01/28/2008    No indication for primary prevention in age group. Also a1c 6 and do not want to increase risk. 30% obstruction LAD does reflect atherosclerosis but very reasonable for age.   NMR Lipoprofile 2005:LDL 129 (8119/ 1478), HDL 51, TG 136.  LDL goal = < 100, ideally < 70.  Father had CM Sister CVA in 63s, smoker    . GERD 01/28/2008    Omeprazole 40mg  BID-->trial once daily 09/2014     Social History   Occupational History  . Occupation: retired  Tobacco Use  . Smoking status: Never Smoker  . Smokeless tobacco: Never Used  Substance and Sexual Activity  . Alcohol use: No  . Drug use: No  . Sexual activity: Not on file   Social History   Social History Narrative   Married 1956. 4 children 2 boys 2 girls. 16 grandkids.  5 greatgrandkids.    Pt lives in [place] with spouse.        Retired from [employer]      Hobbies:  travel, spend time with people, family time   Exercise-walking      No HCPOA-advised to do this.    Past Medical History:  Diagnosis Date  . Atrial fibrillation ([place])    persistent  . Colon polyp 2005  . Depression   . DJD (degenerative joint disease)   . Dysrhythmia   . Esophageal stricture   .  Family history of breast cancer   . Family history of esophageal cancer   . Family history of prostate cancer   . GERD (gastroesophageal reflux disease)   . H. pylori infection   . Hiatal hernia   . Hyperlipidemia   . Macular degeneration   . Nontoxic multinodular goiter   . OA (osteoarthritis)   . Postherpetic neuralgia at T3-T5 level 04/27/2011  . Rectal bleeding 07/06/2014   Hemorrhoid related in past.     Past Surgical History:  Procedure Laterality Date  . ANTERIOR CERVICAL DECOMP/DISCECTOMY FUSION N/A 05/29/2015   Procedure: ANTERIOR CERVICAL DECOMPRESSION/DISCECTOMY FUSION CERVICAL FOUR-FIVE,CERVICAL FIVE-SIX;  Surgeon: Kary Kos, MD;  Location: [facility];  Service: Neurosurgery;  Laterality: N/A;  . APPENDECTOMY    . BREAST LUMPECTOMY WITH RADIOACTIVE SEED LOCALIZATION Left 06/25/2018   Procedure: BREAST LUMPECTOMY WITH RADIOACTIVE SEED LOCALIZATION;  Surgeon: Erroll Luna, MD;  Location: [facility];  Service: General;  Laterality: Left;  . CARDIAC CATHETERIZATION    . CARDIOVERSION  08/29/2011   Procedure: CARDIOVERSION;  Surgeon: Bing Quarry, MD;  Location: [facility];  Service: Cardiovascular;  Laterality: N/A;  . CARDIOVERSION  11/22/2011   Procedure: CARDIOVERSION;  Surgeon: Coralyn Mark, MD;  Location: [facility];  Service: Cardiovascular;  Laterality: N/A;  . CHOLECYSTECTOMY  1989  . COLONOSCOPY W/ POLYPECTOMY  2005   Neg in 2010; Dr Olevia Perches  . ESOPHAGEAL DILATION     > 3 X; Dr Olevia Perches  . LEFT AND RIGHT HEART CATHETERIZATION WITH CORONARY ANGIOGRAM N/A 12/02/2011   Procedure: LEFT AND RIGHT HEART CATHETERIZATION WITH CORONARY ANGIOGRAM;  Surgeon: Jolaine Artist, MD;  Location: [facility] C[facility];  Service: Cardiovascular;  Laterality: N/A;  . TOTAL ABDOMINAL HYSTERECTOMY  1972   for pain (no BSO)  . TUBAL LIGATION     with appendectomy  . UPPER GI ENDOSCOPY  2010   H pylori   family history includes Breast cancer in her maternal aunt and sister; Coronary artery disease in her father and mother; Diabetes in her mother and sister; Esophageal cancer in her brother; Heart failure in her mother; Osteoarthritis in her father; Prostate cancer in her father.  OBJECTIVE:  VS:  HT:    WT:173 lb (78.5 kg)  BMI:     BP:124/62  HR:(!) 102bpm  TEMP: ( )  RESP:94 %   PHYSICAL EXAM: CONSTITUTIONAL: Well-developed, Well-nourished and In no acute distress EYES: Pupils are equal., EOM intact without nystagmus. and No scleral icterus. Psychiatric: Alert & appropriately interactive. and Not depressed or anxious appearing. EXTREMITY EXAM: Warm and well perfused  Her left leg is overall well aligned.  She has good internal and external rotation of her bilateral hips.  She has no pain with FADIR or Corky Sox testing.  She does have some pain with straight leg raise but this is mild.  Pain is worse over bilateral gluteal muscles.  She has weakness with hip abduction especially on the left compared to the right.  No significant effusion of her knees.  Ligamentously stable.   ASSESSMENT:   1. Left hip pain   2. Acute pain of left knee   3. Acute pain of right knee   4. Gait disturbance     PROCEDURES:  None  PLAN:  Pertinent additional documentation may be included in corresponding procedure notes, imaging studies, problem based documentation and patient instructions.  No problem-specific Assessment & Plan notes found for this encounter.   Ultimately symptoms do seem consistent with deconditioning following  the fall and disinhibition of her gluteal muscles.  She should benefit from formal physical therapy and referral for this was placed today.  Prior  x-rays of her hips and knees are reassuring that there is no significant degenerative component likely contributed is other than from her lumbar spine.  Steroid Dosepak risks and benefits discussed and we will plan to start her on this as well as formal therapy.  Activity modifications and the importance of avoiding exacerbating activities (limiting pain to no more than a 4 / 10 during or following activity) recommended and discussed.  Discussed red flag symptoms that warrant earlier emergent evaluation and patient voices understanding.   Meds ordered this encounter  Medications  . predniSONE (DELTASONE) 20 MG tablet    Sig: Take 3tabs PO QAM x3days, 2tabs PO QAM x3days, 1tab PO QAM x4days, 0.5tab PO QAM X4days    Dispense:  21 tablet    Refill:  0   Lab Orders  No laboratory test(s) ordered today   Imaging Orders  No imaging studies ordered today    Referral Orders     Ambulatory referral to Physical Therapy  Return in about 4 weeks (around 01/21/2019).          Gerda Diss, [facility] Physician

## 2019-01-01 ENCOUNTER — Encounter: Payer: Medicare Other | Admitting: Adult Health

## 2019-01-21 ENCOUNTER — Ambulatory Visit: Payer: Medicare Other | Admitting: Sports Medicine

## 2019-02-15 ENCOUNTER — Telehealth: Payer: Self-pay | Admitting: *Deleted

## 2019-02-15 ENCOUNTER — Other Ambulatory Visit: Payer: Self-pay | Admitting: Physician Assistant

## 2019-02-15 MED ORDER — METOPROLOL TARTRATE 25 MG PO TABS: ORAL_TABLET | ORAL | 7 refills | Status: DC

## 2019-02-15 NOTE — Telephone Encounter (Signed)
Received call from pt with hx of left lumpectomy stating she has noticed changes in her breast.  Pt states she is experiencing pain around her nipple and a heaviness in the bottom of her breast.  Per Dr. Lindi Adie, schedule pt for an apt so he can do a breast exam.  Apt scheduled for 02/18/2019 at 11:30 am pt verbalized understanding.

## 2019-02-15 NOTE — Assessment & Plan Note (Addendum)
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  Current treatment: Letrozole 2.5 mg daily started 07/02/2018 Osteopenia: T score -2.1 calcium and vitamin D  Letrozole toxicities: 1.  Joint stiffness and achiness  Breast discomfort: Pain around the nipple and heaviness in the bottom of the breast: On my examination I do not feel any lumps or nodules.  We discussed the role of well fitted bra and decreasing the symptoms.  I gave her a prescription for bras to be fitted.  She is due for mammograms in August.  We will request a left breast mammogram to be done next week.  Patient has osteopenia and could not tolerate bisphosphonate therapy with Fosamax.  Return to clinic in 1 year for follow-up

## 2019-02-17 NOTE — Progress Notes (Signed)
Patient Care Team: [name], MD as PCP - General (Family Medicine) [name], [name], MD as PCP - Cardiology (Cardiology) Erroll Luna, MD as Consulting Physician (General Surgery) [name], MD as Consulting Physician (Hematology and Oncology) Kyung Rudd, MD as Consulting Physician (Radiation Oncology)  DIAGNOSIS:    ICD-10-CM   1. 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, estrogen receptor positive ([place]) C50.512 US BREAST LTD UNI LEFT INC AXILLA   Z17.0 MM DIAG BREAST TOMO UNI LEFT    SUMMARY OF ONCOLOGIC HISTORY:   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, estrogen receptor positive ([place])   05/29/2018 Initial Diagnosis    Screening detected left breast calcifications 7 mm size axilla negative biopsy revealed grade 1 invasive ductal carcinoma with calcifications ER 100%, PR 90%, Ki-67 2%, HER-2 negative, T1 be N0 stage Ia AJCC 8    06/25/2018 Surgery    Left lumpectomy: IDC grade 1, 0.4 cm, DCIS low-grade, margins negative, left additional medial excision: DCIS low-grade, margins negative, ER 100%, PR 90%, HER-2 negative, Ki-67 2%, T1 a N0 stage Ia    07/06/2018 -  Anti-estrogen oral therapy    Letrozole 2.5 mg daily, changed to Anastrozole in 11/2018 due to achiness     CHIEF COMPLIANT: Follow-up on letrozole therapy and pain in breast  INTERVAL HISTORY: Bridget Lane is a 85 y.o. with above-mentioned history of left breast cancer treated with lumpectomy and who is currently on anti-estrogen therapy with anastrozole, after she could not tolerate letrozole. She presents today for a breast exam after reporting recent pain around her nipple and heaviness in her lower breast.  She is also continuing to have problems with her joints especially the left hip and left knee.  She tells me that she had fallen and then since the fall the symptoms have gotten worse.  It is difficult to determine attribution.  REVIEW OF  SYSTEMS:   Constitutional: Denies fevers, chills or abnormal weight loss Eyes: Denies blurriness of vision Ears, nose, mouth, throat, and face: Denies mucositis or sore throat Respiratory: Denies cough, dyspnea or wheezes Cardiovascular: Denies palpitation, chest discomfort Gastrointestinal: Denies nausea, heartburn or change in bowel habits Skin: Denies abnormal skin rashes Lymphatics: Denies new lymphadenopathy or easy bruising Neurological: Denies numbness, tingling or new weaknesses Behavioral/Psych: Mood is stable, no new changes  Extremities: No lower extremity edema Breast: denies any pain or lumps or nodules in either breasts All other systems were reviewed with the patient and are negative.  I have reviewed the past medical history, past surgical history, social history and family history with the patient and they are unchanged from previous note.  ALLERGIES:  is allergic to penicillins; codeine; acetaminophen; dabigatran etexilate mesylate; dabigatran etexilate mesylate; sulfa antibiotics; and sulfonamide derivatives.  MEDICATIONS:  Current Outpatient Medications  Medication Sig Dispense Refill  . anastrozole (ARIMIDEX) 1 MG tablet TAKE 1 TABLET(1 MG) BY MOUTH EVERY OTHER NIGHT FOR 1 WEEK. INCREASE TO TAKING 1 TABLET 1 MG DAILY AT BEDTIME BY MOUTH 90 tablet 0  . apixaban (ELIQUIS) 5 MG TABS tablet Take 1 tablet (5 mg total) by mouth 2 (two) times daily. 180 tablet 3  . flecainide (TAMBOCOR) 100 MG tablet TAKE 1 TABLET(100 MG) BY MOUTH TWICE DAILY 180 tablet 1  . furosemide (LASIX) 20 MG tablet Take 1 tablet (20 mg total) by mouth daily. 90 tablet 3  . ibuprofen (ADVIL,MOTRIN) 600 MG tablet Take 1 tablet (600 mg total) by mouth  every 8 (eight) hours as needed. 30 tablet 2  . metoprolol tartrate (LOPRESSOR) 25 MG tablet TAKE 1/2 TABLET(12.5 MG) BY MOUTH TWICE DAILY 30 tablet 7  . Multiple Vitamins-Minerals (PRESERVISION AREDS 2+MULTI VIT PO) Take by mouth daily.    Marland Kitchen omeprazole  (PRILOSEC) 40 MG capsule Take 1 capsule (40 mg total) by mouth daily. 60 capsule 5  . predniSONE (DELTASONE) 20 MG tablet Take 3tabs PO QAM x3days, 2tabs PO QAM x3days, 1tab PO QAM x4days, 0.5tab PO QAM X4days 21 tablet 0  . tiZANidine (ZANAFLEX) 4 MG tablet Take 4 mg by mouth 2 (two) times daily as needed.  0   No current facility-administered medications for this visit.     PHYSICAL EXAMINATION: ECOG PERFORMANCE STATUS: 1 - Symptomatic but completely ambulatory  Vitals:   02/18/19 1142  BP: (!) 144/40  Pulse: (!) 51  Resp: 17  Temp: 98.1 F (36.7 C)  SpO2: 99%   Filed Weights   02/18/19 1142  Weight: 171 lb 8 oz (77.8 kg)    GENERAL: alert, no distress and comfortable SKIN: skin color, texture, turgor are normal, no rashes or significant lesions EYES: normal, Conjunctiva are pink and non-injected, sclera clear OROPHARYNX: no exudate, no erythema and lips, buccal mucosa, and tongue normal  NECK: supple, thyroid normal size, non-tender, without nodularity LYMPH: no palpable lymphadenopathy in the cervical, axillary or inguinal LUNGS: clear to auscultation and percussion with normal breathing effort HEART: regular rate & rhythm and no murmurs and no lower extremity edema ABDOMEN: abdomen soft, non-tender and normal bowel sounds MUSCULOSKELETAL: no cyanosis of digits and no clubbing  NEURO: alert & oriented x 3 with fluent speech, no focal motor/sensory deficits EXTREMITIES: No lower extremity edema  LABORATORY DATA:  I have reviewed the data as listed CMP Latest Ref Rng & Units 10/28/2018 06/10/2018 05/12/2018  Glucose 65 - 99 mg/dL 98 79 100(H)  BUN 8 - 27 mg/dL _0 Creatinine 0.57 - 1.00 mg/dL 1.07(H) 0.95 1.07  Sodium 134 - 144 mmol/L 143 143 141  Potassium 3.5 - 5.2 mmol/L 4.8 4.1 4.4  Chloride 96 - 106 mmol/L 108(H) 108 105  CO2 20 - 29 mmol/L _1 Calcium 8.7 - 10.3 mg/dL 9.4 9.1 9.3  Total Protein 6.5 - 8.1 g/dL - 7.3 6.9  Total Bilirubin 0.3 - 1.2 mg/dL  - 0.7 0.5  Alkaline Phos 38 - 126 U/L - 86 74  AST 15 - 41 U/L - 19 16  ALT 0 - 44 U/L - 15 16    Lab Results  Component Value Date   WBC 6.4 06/10/2018   HGB 12.3 06/10/2018   HCT 37.0 06/10/2018   MCV 88.9 06/10/2018   PLT 261 06/10/2018   NEUTROABS 3.7 06/10/2018    ASSESSMENT & PLAN:  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, estrogen receptor positive ([facility]) 06/25/2018:Left lumpectomy: IDC grade 1, 0.4 cm, DCIS low-grade, margins negative, left additional medial excision: DCIS low-grade, margins negative, ER 100%, PR 90%, HER-2 negative, Ki-67 2%, T1 a N0 stage Ia Radiation oncology felt that adjuvant radiation is not necessary given her age and favorable prognostic findings.  Current treatment: Letrozole 2.5 mg daily started 07/02/2018 Osteopenia: T score -2.1 calcium and vitamin D  Anastrozole toxicities: 1.  Joint stiffness and achiness The symptoms of persisted in spite of switching her from letrozole to anastrozole. I instructed her to stop anastrozole for 2 weeks and then call us back if her symptoms  got better. We can then switch her to tamoxifen if needed.  Breast discomfort: Pain around the nipple and heaviness in the bottom of the breast: On my examination I do not feel any lumps or nodules.  We discussed the role of well fitted bra and decreasing the symptoms.  I gave her a prescription for bras to be fitted.  She is due for mammograms in August.  We will request a left breast mammogram to be done next week.  Patient has osteopenia and could not tolerate bisphosphonate therapy with Fosamax.  Return to clinic in 1 year for follow-up    Orders Placed This Encounter  Procedures  . US BREAST LTD UNI LEFT INC AXILLA    Standing Status:   Future    Standing Expiration Date:   04/19/2020    Order Specific Question:   Reason for Exam (SYMPTOM  OR DIAGNOSIS REQUIRED)    Answer:   Left breast pain with H/O breast cancer    Order Specific  Question:   Preferred imaging location?    Answer:   [facility]  . MM DIAG BREAST TOMO UNI LEFT    Standing Status:   Future    Standing Expiration Date:   04/19/2020    Order Specific Question:   Reason for Exam (SYMPTOM  OR DIAGNOSIS REQUIRED)    Answer:   Left breast pain    Order Specific Question:   Preferred imaging location?    Answer:   [facility]   The patient has a good understanding of the overall plan. she agrees with it. she will call with any problems that may develop before the next visit here.  [name], MD 02/18/2019  Julious Oka Dorshimer am acting as scribe for Dr. [name].  I have reviewed the above documentation for accuracy and completeness, and I agree with the above.

## 2019-02-18 ENCOUNTER — Other Ambulatory Visit: Payer: Self-pay

## 2019-02-18 ENCOUNTER — Inpatient Hospital Stay: Payer: Medicare Other | Attending: Hematology and Oncology | Admitting: Hematology and Oncology

## 2019-02-18 DIAGNOSIS — Z791 Long term (current) use of non-steroidal anti-inflammatories (NSAID): Secondary | ICD-10-CM | POA: Insufficient documentation

## 2019-02-18 DIAGNOSIS — Z170 Estrogen receptor positive status [ER+]: Secondary | ICD-10-CM | POA: Insufficient documentation

## 2019-02-18 DIAGNOSIS — Z79811 Long term (current) use of aromatase inhibitors: Secondary | ICD-10-CM | POA: Diagnosis not present

## 2019-02-18 DIAGNOSIS — K219 Gastro-esophageal reflux disease without esophagitis: Secondary | ICD-10-CM | POA: Insufficient documentation

## 2019-02-18 DIAGNOSIS — M25552 Pain in left hip: Secondary | ICD-10-CM | POA: Insufficient documentation

## 2019-02-18 DIAGNOSIS — M2560 Stiffness of unspecified joint, not elsewhere classified: Secondary | ICD-10-CM | POA: Insufficient documentation

## 2019-02-18 DIAGNOSIS — Z7901 Long term (current) use of anticoagulants: Secondary | ICD-10-CM | POA: Insufficient documentation

## 2019-02-18 DIAGNOSIS — M25562 Pain in left knee: Secondary | ICD-10-CM | POA: Insufficient documentation

## 2019-02-18 DIAGNOSIS — M8580 Other specified disorders of bone density and structure, unspecified site: Secondary | ICD-10-CM | POA: Diagnosis not present

## 2019-02-18 DIAGNOSIS — C50512 Malignant neoplasm of lower-outer quadrant of left female breast: Secondary | ICD-10-CM | POA: Insufficient documentation

## 2019-02-18 DIAGNOSIS — N644 Mastodynia: Secondary | ICD-10-CM | POA: Insufficient documentation

## 2019-02-18 DIAGNOSIS — Z79899 Other long term (current) drug therapy: Secondary | ICD-10-CM | POA: Insufficient documentation

## 2019-03-03 ENCOUNTER — Telehealth: Payer: Self-pay | Admitting: *Deleted

## 2019-03-03 ENCOUNTER — Other Ambulatory Visit: Payer: Self-pay | Admitting: Hematology and Oncology

## 2019-03-03 MED ORDER — LETROZOLE 2.5 MG PO TABS: 2.5000 mg | ORAL_TABLET | Freq: Every day | ORAL | 0 refills | Status: DC

## 2019-03-03 NOTE — Telephone Encounter (Signed)
Received call from pt stating she has been off of Anastrozole for 10 days and her bone/ joint pain has improved.  Per Dr. Lindi Adie, pt to start on Letrozole for one month to assess how her body tolerates it.  If she continues to have bone and joint pain she may be placed on Tamoxifen. Pt states she will call the office to keep [place] updated on her symptoms.

## 2019-03-05 ENCOUNTER — Ambulatory Visit: Payer: Medicare Other

## 2019-03-05 ENCOUNTER — Ambulatory Visit
Admission: RE | Admit: 2019-03-05 | Discharge: 2019-03-05 | Disposition: A | Discharge status: Home / Self Care | Payer: Medicare Other | Source: Ambulatory Visit | Attending: Hematology and Oncology | Admitting: Hematology and Oncology

## 2019-03-05 ENCOUNTER — Other Ambulatory Visit: Payer: Self-pay

## 2019-03-05 DIAGNOSIS — N644 Mastodynia: Secondary | ICD-10-CM | POA: Diagnosis not present

## 2019-03-05 DIAGNOSIS — C50512 Malignant neoplasm of lower-outer quadrant of left female breast: Secondary | ICD-10-CM

## 2019-03-30 ENCOUNTER — Telehealth: Payer: Self-pay | Admitting: Hematology and Oncology

## 2019-03-30 NOTE — Telephone Encounter (Signed)
I left a message regarding video visit  °

## 2019-03-30 NOTE — Assessment & Plan Note (Signed)
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  Current treatment: Letrozole 2.5 mg daily started 07/02/2018, switched to anastrozole and switched back to letrozole Osteopenia: T score -2.1 calcium and vitamin D  Letrozole toxicities: Joint stiffness and achiness:   Breast cancer surveillance: Left breast mammogram 03/05/2019: No evidence of malignancy, breast density category B Osteopenia: Could not tolerate bisphosphonate therapy. Return to clinic in 1 year for follow-up

## 2019-04-01 DIAGNOSIS — H353132 Nonexudative age-related macular degeneration, bilateral, intermediate dry stage: Secondary | ICD-10-CM | POA: Diagnosis not present

## 2019-04-01 DIAGNOSIS — H43813 Vitreous degeneration, bilateral: Secondary | ICD-10-CM | POA: Diagnosis not present

## 2019-04-04 ENCOUNTER — Other Ambulatory Visit: Payer: Self-pay | Admitting: Hematology and Oncology

## 2019-04-05 ENCOUNTER — Telehealth: Payer: Self-pay | Admitting: Hematology and Oncology

## 2019-04-05 ENCOUNTER — Other Ambulatory Visit: Payer: Self-pay | Admitting: Physician Assistant

## 2019-04-05 MED ORDER — FLECAINIDE ACETATE 100 MG PO TABS: 100.0000 mg | ORAL_TABLET | Freq: Two times a day (BID) | ORAL | 1 refills | Status: DC

## 2019-04-05 NOTE — Progress Notes (Signed)
HEMATOLOGY-ONCOLOGY DOXIMITY VISIT PROGRESS NOTE  I connected with Bridget Lane on 04/06/2019 at 11:15 AM EDT by Doximity video conference and verified that I am speaking with the correct person using two identifiers.  I discussed the limitations, risks, security and privacy concerns of performing an evaluation and management service by Doximity and the availability of in person appointments.  I also discussed with the patient that there may be a patient responsible charge related to this service. The patient expressed understanding and agreed to proceed.  Patient's Location: Home Physician Location: Clinic  Video didn't work. So we did a telephone visit.  CHIEF COMPLIANT: Follow-up on letrozole therapy  INTERVAL HISTORY: Bridget Lane is a 85 y.o. female with above-mentioned history of left breast cancer treated with lumpectomy and who is currently on anti-estrogen therapy with letrozole, after she could not tolerate anastrozole. Mammogram on 03/05/19 showed no evidence of malignancy in the left breast. She presents today for follow-up after one month on letrozole. Shes tolerating letrozole well. She has osteoarthritis. Anxiety about some things.  Oncology History  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, estrogen receptor positive ([place])  05/29/2018 Initial Diagnosis   Screening detected left breast calcifications 7 mm size axilla negative biopsy revealed grade 1 invasive ductal carcinoma with calcifications ER 100%, PR 90%, Ki-67 2%, HER-2 negative, T1 be N0 stage Ia AJCC 8   06/25/2018 Surgery   Left lumpectomy: IDC grade 1, 0.4 cm, DCIS low-grade, margins negative, left additional medial excision: DCIS low-grade, margins negative, ER 100%, PR 90%, HER-2 negative, Ki-67 2%, T1 a N0 stage Ia   07/06/2018 -  Anti-estrogen oral therapy   Letrozole 2.5 mg daily, changed to Anastrozole in 11/2018 due to achiness     REVIEW OF SYSTEMS:   Constitutional: Denies  fevers, chills or abnormal weight loss Eyes: Denies blurriness of vision Ears, nose, mouth, throat, and face: Denies mucositis or sore throat Respiratory: Denies cough, dyspnea or wheezes Cardiovascular: Denies palpitation, chest discomfort Gastrointestinal:  Denies nausea, heartburn or change in bowel habits Skin: Denies abnormal skin rashes Lymphatics: Denies new lymphadenopathy or easy bruising Neurological:Denies numbness, tingling or new weaknesses Behavioral/Psych: anxiety  Extremities: No lower extremity edema Breast: denies any pain or lumps or nodules in either breasts All other systems were reviewed with the patient and are negative.  Observations/Objective:  There were no vitals filed for this visit. There is no height or weight on file to calculate BMI.  I have reviewed the data as listed CMP Latest Ref Rng & Units 10/28/2018 06/10/2018 05/12/2018  Glucose 65 - 99 mg/dL 98 79 100(H)  BUN 8 - 27 mg/dL '25 21 17  '$ Creatinine 0.57 - 1.00 mg/dL 1.07(H) 0.95 1.07  Sodium 134 - 144 mmol/L 143 143 141  Potassium 3.5 - 5.2 mmol/L 4.8 4.1 4.4  Chloride 96 - 106 mmol/L 108(H) 108 105  CO2 20 - 29 mmol/L '23 25 28  '$ Calcium 8.7 - 10.3 mg/dL 9.4 9.1 9.3  Total Protein 6.5 - 8.1 g/dL - 7.3 6.9  Total Bilirubin 0.3 - 1.2 mg/dL - 0.7 0.5  Alkaline Phos 38 - 126 U/L - 86 74  AST 15 - 41 U/L - 19 16  ALT 0 - 44 U/L - 15 16    Lab Results  Component Value Date   WBC 6.4 06/10/2018   HGB 12.3 06/10/2018   HCT 37.0 06/10/2018   MCV 88.9 06/10/2018   PLT 261 06/10/2018   NEUTROABS 3.7 06/10/2018  Assessment Plan:  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, estrogen receptor positive ([place]) 06/25/2018:Left lumpectomy: IDC grade 1, 0.4 cm, DCIS low-grade, margins negative, left additional medial excision: DCIS low-grade, margins negative, ER 100%, PR 90%, HER-2 negative, Ki-67 2%, T1 a N0 stage Ia Radiation oncology felt that adjuvant radiation is not  necessary given her age and favorable prognostic findings.  Current treatment: Letrozole 2.5 mg daily started 07/02/2018, switched to anastrozole and switched back to letrozole Osteopenia: T score -2.1 calcium and vitamin D  Letrozole toxicities: Joint stiffness and achiness: Not bothering her as much by taking it at day time.  Breast cancer surveillance:  Left breast mammogram 03/05/2019: No evidence of malignancy, breast density category B Osteopenia: Could not tolerate bisphosphonate therapy.  Return to clinic in 1 year for follow-up   I discussed the assessment and treatment plan with the patient. The patient was provided an opportunity to ask questions and all were answered. The patient agreed with the plan and demonstrated an understanding of the instructions. The patient was advised to call back or seek an in-person evaluation if the symptoms worsen or if the condition fails to improve as anticipated.   I provided 11 minutes of face-to-face Doximity time during this encounter.    Rulon Eisenmenger, MD 04/06/2019   I, Molly Dorshimer, am acting as scribe for [name], MD.  I have reviewed the above documentation for accuracy and completeness, and I agree with the above.

## 2019-04-05 NOTE — Telephone Encounter (Signed)
Confirmed appt and verified info. °

## 2019-04-06 ENCOUNTER — Inpatient Hospital Stay: Payer: Medicare Other | Attending: Hematology and Oncology | Admitting: Hematology and Oncology

## 2019-04-06 DIAGNOSIS — Z170 Estrogen receptor positive status [ER+]: Secondary | ICD-10-CM | POA: Diagnosis not present

## 2019-04-06 DIAGNOSIS — M8580 Other specified disorders of bone density and structure, unspecified site: Secondary | ICD-10-CM | POA: Diagnosis not present

## 2019-04-06 DIAGNOSIS — Z79811 Long term (current) use of aromatase inhibitors: Secondary | ICD-10-CM

## 2019-04-06 DIAGNOSIS — C50512 Malignant neoplasm of lower-outer quadrant of left female breast: Secondary | ICD-10-CM

## 2019-04-06 MED ORDER — LETROZOLE 2.5 MG PO TABS: ORAL_TABLET | ORAL | 3 refills | Status: DC

## 2019-04-21 ENCOUNTER — Other Ambulatory Visit: Payer: Self-pay | Admitting: Hematology and Oncology

## 2019-04-21 DIAGNOSIS — Z9889 Other specified postprocedural states: Secondary | ICD-10-CM

## 2019-04-26 ENCOUNTER — Other Ambulatory Visit: Payer: Self-pay | Admitting: *Deleted

## 2019-04-26 MED ORDER — APIXABAN 5 MG PO TABS: 5.0000 mg | ORAL_TABLET | Freq: Two times a day (BID) | ORAL | 1 refills | Status: DC

## 2019-04-26 NOTE — Telephone Encounter (Signed)
Prescription refill request for Eliquis received.  Last office visit:  Richardson Dopp ( 10-28-2018) Scr: 1.07 (10-28-2018) Age:  85 y.o. Weight: 77.8kg (02-18-2019)  Prescription refill sent.

## 2019-04-29 ENCOUNTER — Telehealth: Payer: Self-pay | Admitting: Cardiovascular Disease

## 2019-04-29 NOTE — Telephone Encounter (Signed)
New Message            Patient is needing a 6 mth  Appointment for Aug. There is nothing available until Sept. Patient does not want to wait til Sept. Pls call to advise.

## 2019-04-29 NOTE — Telephone Encounter (Signed)
Left the pt a message to call the office back. 

## 2019-04-29 NOTE — Telephone Encounter (Signed)
Follow up ° ° °Patient is returning your call per the previous message. Please call. °

## 2019-05-03 NOTE — Telephone Encounter (Signed)
Patient has been scheduled to see Dr. Acie Fredrickson on August 7

## 2019-05-18 DIAGNOSIS — L570 Actinic keratosis: Secondary | ICD-10-CM | POA: Diagnosis not present

## 2019-05-18 DIAGNOSIS — L821 Other seborrheic keratosis: Secondary | ICD-10-CM | POA: Diagnosis not present

## 2019-05-25 ENCOUNTER — Ambulatory Visit
Admission: RE | Admit: 2019-05-25 | Discharge: 2019-05-25 | Disposition: A | Discharge status: Home / Self Care | Payer: Medicare Other | Source: Ambulatory Visit | Attending: Hematology and Oncology | Admitting: Hematology and Oncology

## 2019-05-25 ENCOUNTER — Other Ambulatory Visit: Payer: Self-pay

## 2019-05-25 DIAGNOSIS — R928 Other abnormal and inconclusive findings on diagnostic imaging of breast: Secondary | ICD-10-CM | POA: Diagnosis not present

## 2019-05-25 DIAGNOSIS — Z9889 Other specified postprocedural states: Secondary | ICD-10-CM

## 2019-05-28 ENCOUNTER — Ambulatory Visit (INDEPENDENT_AMBULATORY_CARE_PROVIDER_SITE_OTHER): Payer: Medicare Other | Admitting: Cardiovascular Disease

## 2019-05-28 ENCOUNTER — Encounter: Payer: Self-pay | Admitting: Cardiovascular Disease

## 2019-05-28 ENCOUNTER — Other Ambulatory Visit: Payer: Self-pay

## 2019-05-28 VITALS — BP 118/76 | HR 85 | Ht 64.0 in | Wt 170.6 lb

## 2019-05-28 DIAGNOSIS — I480 Paroxysmal atrial fibrillation: Secondary | ICD-10-CM

## 2019-05-28 NOTE — Patient Instructions (Signed)
Medication Instructions:  Your physician recommends that you continue on your current medications as directed. Please refer to the Current Medication list given to you today.  If you need a refill on your cardiac medications before your next appointment, please call your pharmacy.    Lab work: None Ordered   Testing/Procedures: None Ordered   Follow-Up: You have been referred to [facility], you and your health needs are our priority.  As part of our continuing mission to provide you with exceptional heart care, we have created designated Provider Care Teams.  These Care Teams include your primary Cardiologist (physician) and Advanced Practice Providers (APPs -  Physician Assistants and Nurse Practitioners) who all work together to provide you with the care you need, when you need it. You will need a follow up appointment in:  6 months.  Please call our office 2 months in advance to schedule this appointment.  You may see Mertie Moores, MD or one of the following Advanced Practice Providers on your designated Care Team: Richardson Dopp, PA-C [place], [place] . Daune Perch, NP

## 2019-05-28 NOTE — Progress Notes (Signed)
Cardiology Office Note:    Date:  05/28/2019   ID:  Bridget Lane, DOB Oct 13, 1933, MRN [id number]  PCP:  [name], MD  Cardiologist:  Mertie Moores, MD  Electrophysiologist:  None   Referring MD: [name], MD   Chief Complaint  Patient presents with  . Atrial Fibrillation  . Shortness of Breath     Aug. 7, 2020    Bridget Lane is a 85 y.o. female with a hx atrial fib.  She is a previous patient of Dr. end and is  transferring care.  She was last seen by Richardson Dopp, PA in January, 2020.  He noted that she had an a history of atrial ablation managed with flecainide and Eliquis.  She has a history of hyperlipidemia and breast cancer.  She was started on furosemide with improvement of her breathing.  Was exercising until she fell 3 months ago  Still has pain in her knee.   xrays do not show significant damange   Past Medical History:  Diagnosis Date  . Atrial fibrillation ([place])    persistent  . Colon polyp 2005  . Depression   . DJD (degenerative joint disease)   . Dysrhythmia   . Esophageal stricture   . Family history of breast cancer   . Family history of esophageal cancer   . Family history of prostate cancer   . GERD (gastroesophageal reflux disease)   . H. pylori infection   . Hiatal hernia   . Hyperlipidemia   . Macular degeneration   . Nontoxic multinodular goiter   . OA (osteoarthritis)   . Postherpetic neuralgia at T3-T5 level 04/27/2011  . Rectal bleeding 07/06/2014   Hemorrhoid related in past.      Past Surgical History:  Procedure Laterality Date  . ANTERIOR CERVICAL DECOMP/DISCECTOMY FUSION N/A 05/29/2015   Procedure: ANTERIOR CERVICAL DECOMPRESSION/DISCECTOMY FUSION CERVICAL FOUR-FIVE,CERVICAL FIVE-SIX;  Surgeon: Kary Kos, MD;  Location: [facility];  Service: Neurosurgery;  Laterality: N/A;  . APPENDECTOMY    . BREAST LUMPECTOMY Left 06/2018  . BREAST LUMPECTOMY WITH RADIOACTIVE SEED LOCALIZATION Left 06/25/2018   Procedure:  BREAST LUMPECTOMY WITH RADIOACTIVE SEED LOCALIZATION;  Surgeon: Erroll Luna, MD;  Location: [facility];  Service: General;  Laterality: Left;  . CARDIAC CATHETERIZATION    . CARDIOVERSION  08/29/2011   Procedure: CARDIOVERSION;  Surgeon: Bing Quarry, MD;  Location: [facility];  Service: Cardiovascular;  Laterality: N/A;  . CARDIOVERSION  11/22/2011   Procedure: CARDIOVERSION;  Surgeon: Coralyn Mark, MD;  Location: [facility] [place];  Service: Cardiovascular;  Laterality: N/A;  . CHOLECYSTECTOMY  1989  . COLONOSCOPY W/ POLYPECTOMY  2005   Neg in 2010; Dr Olevia Perches  . ESOPHAGEAL DILATION     > 3 X; Dr Olevia Perches  . LEFT AND RIGHT HEART CATHETERIZATION WITH CORONARY ANGIOGRAM N/A 12/02/2011   Procedure: LEFT AND RIGHT HEART CATHETERIZATION WITH CORONARY ANGIOGRAM;  Surgeon: Jolaine Artist, MD;  Location: [facility];  Service: Cardiovascular;  Laterality: N/A;  . TOTAL ABDOMINAL HYSTERECTOMY  1972   for pain (no BSO)  . TUBAL LIGATION     with appendectomy  . UPPER GI ENDOSCOPY  2010   H pylori    Current Medications: Current Meds  Medication Sig  . apixaban (ELIQUIS) 5 MG TABS tablet Take 1 tablet (5 mg total) by mouth 2 (two) times daily.  . flecainide (TAMBOCOR) 100 MG tablet Take 1 tablet (100 mg total) by mouth 2 (two) times daily.  Marland Kitchen  furosemide (LASIX) 20 MG tablet Take 1 tablet (20 mg total) by mouth daily.  Marland Kitchen ibuprofen (ADVIL,MOTRIN) 600 MG tablet Take 1 tablet (600 mg total) by mouth every 8 (eight) hours as needed.  Marland Kitchen letrozole (FEMARA) 2.5 MG tablet TAKE 1 TABLET(2.5 MG) BY MOUTH DAILY  . metoprolol tartrate (LOPRESSOR) 25 MG tablet TAKE 1/2 TABLET(12.5 MG) BY MOUTH TWICE DAILY  . Multiple Vitamins-Minerals (PRESERVISION AREDS 2+MULTI VIT PO) Take by mouth daily.  Marland Kitchen omeprazole (PRILOSEC) 40 MG capsule Take 1 capsule (40 mg total) by mouth daily.  Marland Kitchen tiZANidine (ZANAFLEX) 4 MG tablet Take 4 mg by mouth 2 (two) times daily as needed.     Allergies:   Penicillins, Codeine,  Acetaminophen, Dabigatran etexilate mesylate, Dabigatran etexilate mesylate, Sulfa antibiotics, and Sulfonamide derivatives   Social History   Socioeconomic History  . Marital status: Married    Spouse name: Not on file  . Number of children: 4  . Years of education: Not on file  . Highest education level: Not on file  Occupational History  . Occupation: retired  Scientific laboratory technician  . Financial resource strain: Not on file  . Food insecurity    Worry: Not on file    Inability: Not on file  . Transportation needs    Medical: Not on file    Non-medical: Not on file  Tobacco Use  . Smoking status: Never Smoker  . Smokeless tobacco: Never Used  Substance and Sexual Activity  . Alcohol use: No  . Drug use: No  . Sexual activity: Not on file  Lifestyle  . Physical activity    Days per week: Not on file    Minutes per session: Not on file  . Stress: Not on file  Relationships  . Social Herbalist on phone: Not on file    Gets together: Not on file    Attends religious service: Not on file    Active member of club or organization: Not on file    Attends meetings of clubs or organizations: Not on file    Relationship status: Not on file  Other Topics Concern  . Not on file  Social History Narrative   Married 1956. 4 children 2 boys 2 girls. 16 grandkids.  5 greatgrandkids.    Pt lives in [place] with spouse.        Retired from [employer]      Hobbies: travel, spend time with people, family time   Exercise-walking      No HCPOA-advised to do this.      Family History: The patient's family history includes Breast cancer in her maternal aunt and sister; Coronary artery disease in her father and mother; Diabetes in her mother and sister; Esophageal cancer in her brother; Heart failure in her mother; Osteoarthritis in her father; Prostate cancer in her father.  ROS:   Please see the history of present illness.     All other systems reviewed and  are negative.  EKGs/Labs/Other Studies Reviewed:    The following studies were reviewed today:   EKG:   May 28, 2019: Atrial fibrillation with a heart rate of 80.  Rate is well controlled.  Nonspecific IVCD.   Recent Labs: 06/10/2018: ALT 15; Hemoglobin 12.3; Platelet Count 261 10/28/2018: BUN 25; Creatinine, Ser 1.07; Potassium 4.8; Sodium 143  Recent Lipid Panel    Component Value Date/Time   CHOL 214 (H) 11/01/2016 0955   TRIG 164.0 (H) 11/01/2016 0955   HDL 53.50  11/01/2016 0955   CHOLHDL 4 11/01/2016 0955   VLDL 32.8 11/01/2016 0955   LDLCALC 127 (H) 11/01/2016 0955    Physical Exam:    VS:  BP 118/76   Pulse 85   Ht 5\' 4"  (1.626 m)   Wt 170 lb 9.6 oz (77.4 kg)   SpO2 97%   BMI 29.28 kg/m     Wt Readings from Last 3 Encounters:  05/28/19 170 lb 9.6 oz (77.4 kg)  02/18/19 171 lb 8 oz (77.8 kg)  12/24/18 173 lb (78.5 kg)     GEN:   Elderly female,    HEENT: Normal NECK: No JVD; No carotid bruits LYMPHATICS: No lymphadenopathy CARDIAC: Irreg. Irreg.  RESPIRATORY:  Clear to auscultation without rales, wheezing or rhonchi  ABDOMEN: Soft, non-tender, non-distended MUSCULOSKELETAL:  No edema; No deformity  SKIN: Warm and dry NEUROLOGIC:  Alert and oriented x 3 PSYCHIATRIC:  Normal affect   ASSESSMENT:    1. PAF (paroxysmal atrial fibrillation) (HCC)    PLAN:    In order of problems listed above:  1. Paroxysmal atrial fib :   She is back in atrial fib this am.   Thinks she may have gone back into AF last night .   Had an episode of sweats and generalized weakness.   Is not sleeping .   She may convert with her standard dose of flecainide.  We will have her see the atrial fibrillation clinic in several weeks to see if they have any additional suggestions and check an EKG.  If she has not converted she should probably be set up for repeat cardioversion.  The A. fib clinic can consider changing medications as well.  Medication Adjustments/Labs and Tests  Ordered: Current medicines are reviewed at length with the patient today.  Concerns regarding medicines are outlined above.  Orders Placed This Encounter  Procedures  . Amb Referral to AFIB Clinic  . EKG 12-Lead   No orders of the defined types were placed in this encounter.   Patient Instructions  Medication Instructions:  Your physician recommends that you continue on your current medications as directed. Please refer to the Current Medication list given to you today.  If you need a refill on your cardiac medications before your next appointment, please call your pharmacy.    Lab work: None Ordered   Testing/Procedures: None Ordered   Follow-Up: You have been referred to [facility], you and your health needs are our priority.  As part of our continuing mission to provide you with exceptional heart care, we have created designated Provider Care Teams.  These Care Teams include your primary Cardiologist (physician) and Advanced Practice Providers (APPs -  Physician Assistants and Nurse Practitioners) who all work together to provide you with the care you need, when you need it. You will need a follow up appointment in:  6 months.  Please call our office 2 months in advance to schedule this appointment.  You may see Mertie Moores, MD or one of the following Advanced Practice Providers on your designated Care Team: Richardson Dopp, PA-C [place], [place] . Daune Perch, NP      Signed, Mertie Moores, MD  05/28/2019 5:50 PM    [facility]

## 2019-06-01 ENCOUNTER — Other Ambulatory Visit: Payer: Self-pay

## 2019-06-01 ENCOUNTER — Ambulatory Visit (HOSPITAL_COMMUNITY)
Admission: RE | Admit: 2019-06-01 | Discharge: 2019-06-01 | Disposition: A | Discharge status: Home / Self Care | Payer: Medicare Other | Source: Ambulatory Visit | Attending: Physician Assistant | Admitting: Physician Assistant

## 2019-06-01 VITALS — BP 124/66 | HR 53 | Ht 64.0 in | Wt 172.0 lb

## 2019-06-01 DIAGNOSIS — I480 Paroxysmal atrial fibrillation: Secondary | ICD-10-CM

## 2019-06-01 DIAGNOSIS — Z6829 Body mass index (BMI) 29.0-29.9, adult: Secondary | ICD-10-CM | POA: Insufficient documentation

## 2019-06-01 DIAGNOSIS — Z7901 Long term (current) use of anticoagulants: Secondary | ICD-10-CM | POA: Insufficient documentation

## 2019-06-01 DIAGNOSIS — H3530 Unspecified macular degeneration: Secondary | ICD-10-CM | POA: Diagnosis not present

## 2019-06-01 DIAGNOSIS — E669 Obesity, unspecified: Secondary | ICD-10-CM | POA: Insufficient documentation

## 2019-06-01 DIAGNOSIS — K219 Gastro-esophageal reflux disease without esophagitis: Secondary | ICD-10-CM | POA: Diagnosis not present

## 2019-06-01 DIAGNOSIS — Z79899 Other long term (current) drug therapy: Secondary | ICD-10-CM | POA: Diagnosis not present

## 2019-06-01 DIAGNOSIS — E042 Nontoxic multinodular goiter: Secondary | ICD-10-CM | POA: Insufficient documentation

## 2019-06-01 DIAGNOSIS — E785 Hyperlipidemia, unspecified: Secondary | ICD-10-CM | POA: Diagnosis not present

## 2019-06-01 DIAGNOSIS — F329 Major depressive disorder, single episode, unspecified: Secondary | ICD-10-CM | POA: Insufficient documentation

## 2019-06-01 DIAGNOSIS — M1990 Unspecified osteoarthritis, unspecified site: Secondary | ICD-10-CM | POA: Diagnosis not present

## 2019-06-01 NOTE — Progress Notes (Signed)
Primary Care Physician: [name], MD Primary Cardiologist: Dr Acie Fredrickson Primary Electrophysiologist: Dr Rayann Heman Referring Physician: Dr End   Bridget Lane is a 85 y.o. female with a history of paroxysmal atrial fibrillation, HLD, breast cancer, non obstructive CAD who presents for follow up in [facility]. She was diagnosed with afib in 2012 and has been maintained on flecainide and Eliquis. She recently saw Dr Acie Fredrickson for routine follow up and was found to be in rate controlled afib. She admits that for the last few nights she has not been sleeping well 2/2 arthritis pain. She denies any other specific triggers. She is back in SR today. Patient feels she went back into normal rhythm 8/10. She was asymptomatic but can tell she is in afib by checking the regularity of her pulse.   Today, she denies symptoms of palpitations, chest pain, shortness of breath, orthopnea, PND, lower extremity edema, dizziness, presyncope, syncope, snoring, daytime somnolence, bleeding, or neurologic sequela. The patient is tolerating medications without difficulties and is otherwise without complaint today.     she has a BMI of Body mass index is 29.52 kg/m.Marland Kitchen Filed Weights   06/01/19 1413  Weight: 78 kg    Family History  Problem Relation Age of Onset  . Heart failure Mother   . Coronary artery disease Mother   . Diabetes Mother   . Osteoarthritis Father   . Coronary artery disease Father   . Prostate cancer Father        in 22s  . Breast cancer Sister        in 80's  . Diabetes Sister   . Esophageal cancer Brother        smoked  . Breast cancer Maternal Aunt      Atrial Fibrillation Management history:  Previous antiarrhythmic drugs: flecainide Previous cardioversions: 2012, 2013 Previous ablations: none CHADS2VASC score: 3 Anticoagulation history: Eliquis   Past Medical History:  Diagnosis Date  . Atrial fibrillation ([place])    persistent  .  Colon polyp 2005  . Depression   . DJD (degenerative joint disease)   . Dysrhythmia   . Esophageal stricture   . Family history of breast cancer   . Family history of esophageal cancer   . Family history of prostate cancer   . GERD (gastroesophageal reflux disease)   . H. pylori infection   . Hiatal hernia   . Hyperlipidemia   . Macular degeneration   . Nontoxic multinodular goiter   . OA (osteoarthritis)   . Postherpetic neuralgia at T3-T5 level 04/27/2011  . Rectal bleeding 07/06/2014   Hemorrhoid related in past.     Past Surgical History:  Procedure Laterality Date  . ANTERIOR CERVICAL DECOMP/DISCECTOMY FUSION N/A 05/29/2015   Procedure: ANTERIOR CERVICAL DECOMPRESSION/DISCECTOMY FUSION CERVICAL FOUR-FIVE,CERVICAL FIVE-SIX;  Surgeon: Kary Kos, MD;  Location: [place];  Service: Neurosurgery;  Laterality: N/A;  . APPENDECTOMY    . BREAST LUMPECTOMY Left 06/2018  . BREAST LUMPECTOMY WITH RADIOACTIVE SEED LOCALIZATION Left 06/25/2018   Procedure: BREAST LUMPECTOMY WITH RADIOACTIVE SEED LOCALIZATION;  Surgeon: Erroll Luna, MD;  Location: [facility];  Service: General;  Laterality: Left;  . CARDIAC CATHETERIZATION    . CARDIOVERSION  08/29/2011   Procedure: CARDIOVERSION;  Surgeon: Bing Quarry, MD;  Location: [place];  Service: Cardiovascular;  Laterality: N/A;  . CARDIOVERSION  11/22/2011   Procedure: CARDIOVERSION;  Surgeon: Coralyn Mark, MD;  Location: [place] [place];  Service: Cardiovascular;  Laterality:  N/A;  . CHOLECYSTECTOMY  1989  . COLONOSCOPY W/ POLYPECTOMY  2005   Neg in 2010; Dr Olevia Perches  . ESOPHAGEAL DILATION     > 3 X; Dr Olevia Perches  . LEFT AND RIGHT HEART CATHETERIZATION WITH CORONARY ANGIOGRAM N/A 12/02/2011   Procedure: LEFT AND RIGHT HEART CATHETERIZATION WITH CORONARY ANGIOGRAM;  Surgeon: Jolaine Artist, MD;  Location: [facility] CATH LAB;  Service: Cardiovascular;  Laterality: N/A;  . TOTAL ABDOMINAL HYSTERECTOMY  1972   for pain (no BSO)  . TUBAL LIGATION      with appendectomy  . UPPER GI ENDOSCOPY  2010   H pylori    Current Outpatient Medications  Medication Sig Dispense Refill  . apixaban (ELIQUIS) 5 MG TABS tablet Take 1 tablet (5 mg total) by mouth 2 (two) times daily. 180 tablet 1  . flecainide (TAMBOCOR) 100 MG tablet Take 1 tablet (100 mg total) by mouth 2 (two) times daily. 180 tablet 1  . furosemide (LASIX) 20 MG tablet Take 1 tablet (20 mg total) by mouth daily. 90 tablet 3  . ibuprofen (ADVIL,MOTRIN) 600 MG tablet Take 1 tablet (600 mg total) by mouth every 8 (eight) hours as needed. 30 tablet 2  . letrozole (FEMARA) 2.5 MG tablet TAKE 1 TABLET(2.5 MG) BY MOUTH DAILY 90 tablet 3  . metoprolol tartrate (LOPRESSOR) 25 MG tablet TAKE 1/2 TABLET(12.5 MG) BY MOUTH TWICE DAILY 30 tablet 7  . Multiple Vitamins-Minerals (PRESERVISION AREDS 2+MULTI VIT PO) Take by mouth daily.    Marland Kitchen omeprazole (PRILOSEC) 40 MG capsule Take 1 capsule (40 mg total) by mouth daily. 60 capsule 5  . tiZANidine (ZANAFLEX) 4 MG tablet Take 4 mg by mouth 2 (two) times daily as needed.  0   No current facility-administered medications for this encounter.     Allergies  Allergen Reactions  . Penicillins Rash  . Codeine Other (See Comments)    hallucinations hallucinations  . Acetaminophen Other (See Comments)    hurts stomach hurts stomach  . Dabigatran Etexilate Mesylate Other (See Comments)     All extremities feel heavy and hurt  . Dabigatran Etexilate Mesylate Other (See Comments)     All extremities feel heavy and hurt  . Sulfa Antibiotics Rash  . Sulfonamide Derivatives Rash    Social History   Socioeconomic History  . Marital status: Married    Spouse name: Not on file  . Number of children: 4  . Years of education: Not on file  . Highest education level: Not on file  Occupational History  . Occupation: retired  Scientific laboratory technician  . Financial resource strain: Not on file  . Food insecurity    Worry: Not on file    Inability: Not on file  .  Transportation needs    Medical: Not on file    Non-medical: Not on file  Tobacco Use  . Smoking status: Never Smoker  . Smokeless tobacco: Never Used  Substance and Sexual Activity  . Alcohol use: No  . Drug use: No  . Sexual activity: Not on file  Lifestyle  . Physical activity    Days per week: Not on file    Minutes per session: Not on file  . Stress: Not on file  Relationships  . Social Herbalist on phone: Not on file    Gets together: Not on file    Attends religious service: Not on file    Active member of club or organization: Not on file  Attends meetings of clubs or organizations: Not on file    Relationship status: Not on file  . Intimate partner violence    Fear of current or ex partner: Not on file    Emotionally abused: Not on file    Physically abused: Not on file    Forced sexual activity: Not on file  Other Topics Concern  . Not on file  Social History Narrative   Married 1956. 4 children 2 boys 2 girls. 16 grandkids.  5 greatgrandkids.    Pt lives in [place] with spouse.        Retired from [employer]      Hobbies: travel, spend time with people, family time   Exercise-walking      No HCPOA-advised to do this.      ROS- All systems are reviewed and negative except as per the HPI above.  Physical Exam: Vitals:   06/01/19 1413  BP: 124/66  Pulse: (!) 53  Weight: 78 kg  Height: 5\' 4"  (1.626 m)    GEN- The patient is well appearing elderly female, alert and oriented x 3 today.   Head- normocephalic, atraumatic Eyes-  Sclera clear, conjunctiva pink Ears- hearing intact Oropharynx- clear Neck- supple  Lungs- Clear to ausculation bilaterally, normal work of breathing Heart- Regular rate and rhythm, no murmurs, rubs or gallops  GI- soft, NT, ND, + BS Extremities- no clubbing, cyanosis, or edema MS- no significant deformity or atrophy Skin- no rash or lesion Psych- euthymic mood, full affect Neuro-  strength and sensation are intact  Wt Readings from Last 3 Encounters:  06/01/19 78 kg  05/28/19 77.4 kg  02/18/19 77.8 kg    EKG today demonstrates SR HR 53, 1st degree AV block, PR 232, QRS 110, QTc 459  Echo 10/13/17 demonstrated  - Left ventricle: The cavity size was normal. Wall thickness was   normal. Systolic function was normal. The estimated ejection   fraction was in the range of 55% to 60%. Wall motion was normal;   there were no regional wall motion abnormalities. - Aortic valve: There was trivial regurgitation. - Mitral valve: There was mild regurgitation. - Pulmonary arteries: PA peak pressure: 33 mm Hg (S).  Impressions:  - Normal LV systolic function; trace AI; mild MR and TR.  Nuclear stress test 10/27/2017 EF 55, apical lateral and apical defect likely breast attenuation, no ischemia; low risk  Epic records are reviewed at length today  Assessment and Plan:  1. Paroxysmal atrial fibrillation Patient had recent episode of afib. ? Secondary to lack of sleep. She is back in SR today. Encouraged her to follow up with PCP about options for arthritis pain. General education about afib discussed and questions answered. Continue Eliquis 5 mg BID Continue flecainide 100 mg BID Continue Lopressor 12.5 mg BID If her afib becomes more persistent/symptomatic will consider change in AAD vs DCCV.  This patients CHA2DS2-VASc Score and unadjusted Ischemic Stroke Rate (% per year) is equal to 3.2 % stroke rate/year from a score of 3  Above score calculated as 1 point each if present [CHF, HTN, DM, Vascular=MI/PAD/Aortic Plaque, Age if 65-74, or Female] Above score calculated as 2 points each if present [Age > 75, or Stroke/TIA/TE]   2. Obesity Body mass index is 29.52 kg/m. Lifestyle modification was discussed at length including regular exercise and weight reduction.   Follow up in the AF clinic in 3 months. Dr Acie Fredrickson per recall.   Adline Peals PA-C Afib Clinic  [facility] [address] [place], [place] [address] [phone] 06/01/2019 2:52 PM

## 2019-06-16 ENCOUNTER — Other Ambulatory Visit: Payer: Self-pay | Admitting: Family Medicine

## 2019-06-16 NOTE — Telephone Encounter (Signed)
I would prefer for her to be on this just once a day-can you contact her and see if she is willing to try once a day.  If not we probably need to discuss benefits and risks of high-dose-possible virtual visit

## 2019-06-16 NOTE — Telephone Encounter (Signed)
Last OV 10/30/18 Not previously prescribed by Dr. Lewayne Bunting to Dr. Yong Channel

## 2019-06-16 NOTE — Telephone Encounter (Signed)
Called and spoke with patient. She is only taking 1 capsule daily. She does not need a refill at this time.

## 2019-06-18 ENCOUNTER — Ambulatory Visit (INDEPENDENT_AMBULATORY_CARE_PROVIDER_SITE_OTHER): Payer: Medicare Other

## 2019-06-18 ENCOUNTER — Other Ambulatory Visit: Payer: Self-pay

## 2019-06-18 ENCOUNTER — Encounter: Payer: Self-pay | Admitting: Family Medicine

## 2019-06-18 ENCOUNTER — Ambulatory Visit (INDEPENDENT_AMBULATORY_CARE_PROVIDER_SITE_OTHER): Payer: Medicare Other | Admitting: Family Medicine

## 2019-06-18 VITALS — BP 138/78 | HR 56 | Temp 98.0°F | Ht 64.0 in | Wt 168.4 lb

## 2019-06-18 DIAGNOSIS — M79605 Pain in left leg: Secondary | ICD-10-CM

## 2019-06-18 DIAGNOSIS — Z23 Encounter for immunization: Secondary | ICD-10-CM | POA: Diagnosis not present

## 2019-06-18 DIAGNOSIS — M810 Age-related osteoporosis without current pathological fracture: Secondary | ICD-10-CM | POA: Diagnosis not present

## 2019-06-18 DIAGNOSIS — R29898 Other symptoms and signs involving the musculoskeletal system: Secondary | ICD-10-CM | POA: Diagnosis not present

## 2019-06-18 DIAGNOSIS — M79604 Pain in right leg: Secondary | ICD-10-CM | POA: Diagnosis not present

## 2019-06-18 DIAGNOSIS — R351 Nocturia: Secondary | ICD-10-CM

## 2019-06-18 DIAGNOSIS — M47816 Spondylosis without myelopathy or radiculopathy, lumbar region: Secondary | ICD-10-CM | POA: Diagnosis not present

## 2019-06-18 DIAGNOSIS — F330 Major depressive disorder, recurrent, mild: Secondary | ICD-10-CM

## 2019-06-18 LAB — CBC
HCT: 36 % (ref 36.0–46.0)
Hemoglobin: 12.1 g/dL (ref 12.0–15.0)
MCHC: 33.5 g/dL (ref 30.0–36.0)
MCV: 87.1 fl (ref 78.0–100.0)
Platelets: 300 10*3/uL (ref 150.0–400.0)
RBC: 4.14 Mil/uL (ref 3.87–5.11)
RDW: 14.1 % (ref 11.5–15.5)
WBC: 7.6 10*3/uL (ref 4.0–10.5)

## 2019-06-18 LAB — POC URINALSYSI DIPSTICK (AUTOMATED)
Bilirubin, UA: NEGATIVE
Blood, UA: NEGATIVE
Glucose, UA: NEGATIVE
Ketones, UA: NEGATIVE
Nitrite, UA: NEGATIVE
Protein, UA: NEGATIVE
Spec Grav, UA: >=1.03 — AB (ref 1.010–1.025)
Urobilinogen, UA: 0.2 E.U./dL
pH, UA: 5.5 (ref 5.0–8.0)

## 2019-06-18 LAB — VITAMIN B12: Vitamin B-12: 408 pg/mL (ref 211–911)

## 2019-06-18 LAB — COMPREHENSIVE METABOLIC PANEL
ALT: 13 U/L (ref 0–35)
AST: 18 U/L (ref 0–37)
Albumin: 4.2 g/dL (ref 3.5–5.2)
Alkaline Phosphatase: 66 U/L (ref 39–117)
BUN: 27 mg/dL — ABNORMAL HIGH (ref 6–23)
CO2: 28 mEq/L (ref 19–32)
Calcium: 9.7 mg/dL (ref 8.4–10.5)
Chloride: 105 mEq/L (ref 96–112)
Creatinine, Ser: 1.3 mg/dL — ABNORMAL HIGH (ref 0.40–1.20)
GFR: 39.01 mL/min — ABNORMAL LOW (ref >60.00)
Glucose, Bld: 96 mg/dL (ref 70–99)
Potassium: 4.7 mEq/L (ref 3.5–5.1)
Sodium: 140 mEq/L (ref 135–145)
Total Bilirubin: 0.6 mg/dL (ref 0.2–1.2)
Total Protein: 7 g/dL (ref 6.0–8.3)

## 2019-06-18 LAB — VITAMIN D 25 HYDROXY (VIT D DEFICIENCY, FRACTURES): VITD: 34.92 ng/mL (ref 30.00–100.00)

## 2019-06-18 MED ORDER — TRAMADOL HCL 50 MG PO TABS: 25.0000 mg | ORAL_TABLET | Freq: Two times a day (BID) | ORAL | 0 refills | Status: DC | PRN

## 2019-06-18 MED ORDER — PREDNISONE 20 MG PO TABS: ORAL_TABLET | ORAL | 0 refills | Status: DC

## 2019-06-18 NOTE — Addendum Note (Signed)
Addended by: Jasper Loser on: 06/18/2019 01:50 PM   Modules accepted: Orders

## 2019-06-18 NOTE — Patient Instructions (Addendum)
Health Maintenance Due  Topic Date Due  . INFLUENZA VACCINE - will think about it 05/22/2019   Please stop by lab and x-ray before you go If you do not have mychart- we will call you about results within 5 business days of [place] receiving them.  If you have mychart- we will send your results within 3 business days of [place] receiving them.  If abnormal or we want to clarify a result, we will call or mychart you to make sure you receive the message.  If you have questions or concerns or don't hear within 5-7 days, please send [place] a message or call us.   Try tramadol for pain before bed. Can take in day as well but no driving for 8 hours after taking. If hallucinations then stop  Start prednisone tomorrow morning  We will call you within two weeks about your referral to Dr. Tamala Julian. If you do not hear within 3 weeks, give [place] a call.

## 2019-06-18 NOTE — Progress Notes (Signed)
Phone [phone]   Subjective:  Bridget Lane is a 85 y.o. year old very pleasant female patient who presents for/with See problem oriented charting Chief Complaint  Patient presents with  . Hip Pain   ROS- feels leg weakness. No incontinence. Feels like "has boots on feet up to shins. Feels weak in legs   Past Medical History-  Patient Active Problem List   Diagnosis Date Noted  . 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, estrogen receptor positive ([place]) 06/03/2018    Priority: High  . Atrial fibrillation 06/14/2011    Priority: High  . Insomnia 08/25/2015    Priority: Medium  . Spinal stenosis in cervical region 05/29/2015    Priority: Medium  . Hyperglycemia 06/07/2014    Priority: Medium  . Hyperlipidemia 01/28/2008    Priority: Medium  . Cervical lymphadenopathy 05/16/2016    Priority: Low  . History of Helicobacter pylori infection 07/06/2014    Priority: Low  . OSA (obstructive sleep apnea) 02/11/2012    Priority: Low  . Long term (current) use of anticoagulants 07/19/2011    Priority: Low  . Chest pain 06/14/2011    Priority: Low  . Depression 01/30/2010    Priority: Low  . History of colonic polyps 01/30/2010    Priority: Low  . ESOPHAGEAL STRICTURE 07/20/2008    Priority: Low  . Osteoarthritis 07/20/2008    Priority: Low  . Multiple thyroid nodules 01/28/2008    Priority: Low  . GERD 01/28/2008    Priority: Low  . Osteoporosis 10/30/2018  . Genetic testing 07/02/2018  . Dyspnea on exertion 03/23/2018  . Constipation 08/29/2017    Medications- reviewed and updated Current Outpatient Medications  Medication Sig Dispense Refill  . apixaban (ELIQUIS) 5 MG TABS tablet Take 1 tablet (5 mg total) by mouth 2 (two) times daily. 180 tablet 1  . flecainide (TAMBOCOR) 100 MG tablet Take 1 tablet (100 mg total) by mouth 2 (two) times daily. 180 tablet 1  . furosemide (LASIX) 20 MG tablet Take 1 tablet (20 mg total) by mouth  daily. 90 tablet 3  . ibuprofen (ADVIL,MOTRIN) 600 MG tablet Take 1 tablet (600 mg total) by mouth every 8 (eight) hours as needed. 30 tablet 2  . letrozole (FEMARA) 2.5 MG tablet TAKE 1 TABLET(2.5 MG) BY MOUTH DAILY 90 tablet 3  . metoprolol tartrate (LOPRESSOR) 25 MG tablet TAKE 1/2 TABLET(12.5 MG) BY MOUTH TWICE DAILY 30 tablet 7  . Multiple Vitamins-Minerals (PRESERVISION AREDS 2+MULTI VIT PO) Take by mouth daily.    Marland Kitchen omeprazole (PRILOSEC) 40 MG capsule Take 1 capsule (40 mg total) by mouth daily. 60 capsule 5  . predniSONE (DELTASONE) 20 MG tablet Take 2 pills for 3 days, 1 pill for 4 days 10 tablet 0  . traMADol (ULTRAM) 50 MG tablet Take 0.5 tablets (25 mg total) by mouth every 12 (twelve) hours as needed for moderate pain or severe pain. 10 tablet 0   No current facility-administered medications for this visit.      Objective:  BP 138/78 (BP Location: Right Arm, Patient Position: Sitting, Cuff Size: Normal)   Pulse (!) 56   Temp 98 F (36.7 C) (Oral)   Ht 5\' 4"  (1.626 m)   Wt 168 lb 6.4 oz (76.4 kg)   SpO2 (!) 87%   BMI 28.91 kg/m  Gen: NAD, resting comfortably CV: RRR no murmurs rubs or gallops Lungs: CTAB no crackles, wheeze, rhonchi Abdomen: soft/nontender/nondistended/normal bowel sounds. No rebound or guarding.  Ext: no edema Skin: warm, dry Back - Normal skin, Spine with normal alignment and no deformity.  No tenderness to vertebral process palpation.  Paraspinous muscles are not tender and without spasm.   Range of motion is full at neck and lumbar sacral regions. Negative Straight leg raise.  Neuro- no saddle anesthesia, 4/5 strength leg extension, 5/5 lower leg extension    Assessment and Plan   # bilateral leg pain and weakness #polyuria S: Patient with fall back in January- we did knee and hip films at tha ttime. She had arthritis in right knee, left knee largely reassuring, left hip film- could see degenerative changes in lumbar spine. Later saw Dr. Paulla Fore and  had course of prednisone- she think she improved on this and would like to retry  Complains of severe pain in both thighs radiating down to the shins when she tries to stand up but most prominent in thighs and then down into the knees.  She c/o weakness in both legs, they have given out on her on several occasions (no falls but she has been very cautions). Sx keep her up at night. This has been going on for several months and seems to be worsening. Sx are worse with weight bearing and walking, also left side-lying. She has difficulty with sit-to stand. Unable to take NSAIDs on eliquis. She has tried heat and ice with temporary relief. C/o "explosive" feeling in B knees with standing. Reports no swelling in legs. Also c/o increased nocturia, large volume of yellow urine.  She does take her lasix at night though as doesn't want to pee in the dya A/P: 85 year old female with known DDD lumbar spine based on prior x-rays and also history of breast cancer now with continued leg pain and weakness for 7 months- seemed originally to be triggered after fall in January but has been progressive -retrial course of prednisone -refer to sports medicine -lumbar spine films (? Mets/bony lesion- dont think highly likely), think more likely lumbar stenosis -for polyuria will get UA and culture but suspect related to lasix. No dysuria - patient states was told years ago not to take tylenol due to her breast cancer- I do not quite understand this but also listed as allergy for stomach upset. Codeine causes hallucinations but states she really needs something for pain before bed- try tramadol 50mg  half tablet   # Depression prior in remission S: poor control of phq9 today but suspect situational related to pain/weakness issues A/P: mild depression I would say at this point- we agreed to recheck after leg issues have been further worked up.   Recommended follow up: as needed for acute issues- next step sports medicine eval   Future Appointments  Date Time Provider [place]  09/01/2019 11:30 AM Fenton, Berton Mount R, PA [facility] None  04/05/2020  8:45 AM [name], MD [facility] None    Lab/Order associations:   ICD-10-CM   1. Bilateral leg pain  M79.604 DG Lumbar Spine Complete   M79.605 Ambulatory referral to Sports Medicine  2. Weakness of both lower extremities  R29.898 DG Lumbar Spine Complete    CBC    Comprehensive metabolic panel    Vitamin B12    Ambulatory referral to Sports Medicine  3. Nocturia  R35.1 POCT Urinalysis Dipstick (Automated)    Urine Culture  4. Age-related osteoporosis without current pathological fracture  M81.0 VITAMIN D 25 Hydroxy (Vit-D Deficiency, Fractures)    Meds ordered this encounter  Medications  . predniSONE (  DELTASONE) 20 MG tablet    Sig: Take 2 pills for 3 days, 1 pill for 4 days    Dispense:  10 tablet    Refill:  0  . traMADol (ULTRAM) 50 MG tablet    Sig: Take 0.5 tablets (25 mg total) by mouth every 12 (twelve) hours as needed for moderate pain or severe pain.    Dispense:  10 tablet    Refill:  0   Time Stamp The duration of face-to-face time during this visit was greater than 25 minutes. Greater than 50% of this time was spent in counseling, explanation of diagnosis, planning of further management, and/or coordination of care including discussing frustrations of pain, counseling patient about need to work up issues and then reevaluate depressed symptoms, discussing next steps/work up/potential causes  Return precautions advised.  Garret Reddish, MD

## 2019-06-18 NOTE — Addendum Note (Signed)
Addended by: Francis Dowse T on: 06/18/2019 02:12 PM   Modules accepted: Orders

## 2019-06-20 LAB — URINE CULTURE
MICRO NUMBER:: 823739
SPECIMEN QUALITY:: ADEQUATE

## 2019-06-21 ENCOUNTER — Other Ambulatory Visit: Payer: Self-pay | Admitting: Family Medicine

## 2019-06-21 MED ORDER — NITROFURANTOIN MONOHYD MACRO 100 MG PO CAPS: 100.0000 mg | ORAL_CAPSULE | Freq: Two times a day (BID) | ORAL | 0 refills | Status: DC

## 2019-06-22 ENCOUNTER — Other Ambulatory Visit: Payer: Self-pay | Admitting: Family Medicine

## 2019-07-02 ENCOUNTER — Ambulatory Visit: Payer: Self-pay | Admitting: *Deleted

## 2019-07-02 DIAGNOSIS — M79604 Pain in right leg: Secondary | ICD-10-CM

## 2019-07-02 DIAGNOSIS — M79605 Pain in left leg: Secondary | ICD-10-CM

## 2019-07-02 DIAGNOSIS — R29898 Other symptoms and signs involving the musculoskeletal system: Secondary | ICD-10-CM

## 2019-07-02 NOTE — Addendum Note (Signed)
Addended by: Jasper Loser on: 07/02/2019 02:39 PM   Modules accepted: Orders

## 2019-07-02 NOTE — Telephone Encounter (Signed)
Spoke to [place] and she stated that pt was "tearful and beside herself". Per Opal Sidles PT stated that she was in pain and Dr. Yong Channel had mentioned something to her in the past that " we were not the drug store" so pt was uncomfortable to ask for something for pain or sleep. PT declined appt. bc she was so worried of what Dr. Yong Channel would think of her. Pt was given Prednisone and other medication in the past that helped per [facility] nurse. Pt was at the hospital yesterday with her husband and it was cold in the hospital and her neck is hurting from sitting in hospital.   Will forwards to [place] and verbally speak with him.

## 2019-07-02 NOTE — Addendum Note (Signed)
Addended by: Jasper Loser on: 07/02/2019 02:41 PM   Modules accepted: Orders

## 2019-07-02 NOTE — Telephone Encounter (Signed)
Please tell patient I never recall stating that we were "not the drugstore" and if I did say that I am certainly sorry-I do agree we should be cautious with pain medicines and reserve them when people are in severe pain-she sounds to be in severe pain now.  I saw that her appointment with Dr. Tamala Julian of sports medicine was not until September 23 unfortunately- team can we see if we can get her into orthopedics sooner-suspect patient can see Dr. Junius Roads of [facility] early next week- please try to set this up today if possible..  Was the tramadol helpful at all?  Did she do okay with it?  I would be more than happy to refill this for her if helping.

## 2019-07-02 NOTE — Telephone Encounter (Signed)
Rx refill request forwarded to Dr. Yong Channel  Referral to ortho placed. Forwarding to [place], see note per Dr. Yong Channel.

## 2019-07-02 NOTE — Telephone Encounter (Signed)
Summary: neck pain, fatigue    Patient calling requesting to speak with NT to discuss fatigue and pain in neck and down spine. Patient inquired about what sort of medication she is able to take OTC until appointment with her PCP on 9/15. Please advise.     Patient is calling to report she is having pain - she is not sleeping. Patient is tearful. Patient had a fall and she had pain in leg and all over pain. Patient states the medication did help- patient is having back and neck pain. She still has weakness. Patient is crying and upset- she is anxious and in pain- but she is afraid that her PCP will think she is after pain medication. Offered an appointment - but patient states she is coming next week. Patient keeps stating she does not want her PCP to think bad of her. Will send message because I think it is important that this patient is addressed. She is using heating pad and creams. She wants to feel normal again and the medication that was prescribed at the last appointment helped her so much.   Reason for Disposition . [1] SEVERE back pain (e.g., excruciating, unable to do any normal activities) AND [2] not improved 2 hours after pain medicine  Answer Assessment - Initial Assessment Questions 1. ONSET: "When did the pain begin?"      With a fall- 5-6 months ago- it did improve with medication 2. LOCATION: "Where does it hurt?" (upper, mid or lower back)     Neck and spine- now 3. SEVERITY: "How bad is the pain?"  (e.g., Scale 1-10; mild, moderate, or severe)   - MILD (1-3): doesn't interfere with normal activities    - MODERATE (4-7): interferes with normal activities or awakens from sleep    - SEVERE (8-10): excruciating pain, unable to do any normal activities      Moderate/Severe- unable to sleep and becoming anxious 4. PATTERN: "Is the pain constant?" (e.g., yes, no; constant, intermittent)      constant 5. RADIATION: "Does the pain shoot into your legs or elsewhere?"     no 6. CAUSE:   "What do you think is causing the back pain?"      Patient stopped the medication given for pain 7. BACK OVERUSE:  "Any recent lifting of heavy objects, strenuous work or exercise?"    Yes- patient works hard every day 8. MEDICATIONS: "What have you taken so far for the pain?" (e.g., nothing, acetaminophen, NSAIDS)     Patient can not take a lot of OTC medications- she has been taking Aleve for the last couple days 9. NEUROLOGIC SYMPTOMS: "Do you have any weakness, numbness, or problems with bowel/bladder control?"     No 10. OTHER SYMPTOMS: "Do you have any other symptoms?" (e.g., fever, abdominal pain, burning with urination, blood in urine)       Patient is very anxious  11. PREGNANCY: "Is there any chance you are pregnant?" (e.g., yes, no; LMP)       n/a  Protocols used: BACK PAIN-A-AH

## 2019-07-02 NOTE — Telephone Encounter (Signed)
Called and spoke with patient and advised per Dr. Yong Channel. She says that the Tramadol was very helpful and she would like a refill on that.   Forwarding to Dr. Yong Channel.

## 2019-07-03 MED ORDER — TRAMADOL HCL 50 MG PO TABS: 25.0000 mg | ORAL_TABLET | Freq: Two times a day (BID) | ORAL | 0 refills | Status: DC | PRN

## 2019-07-06 ENCOUNTER — Ambulatory Visit (INDEPENDENT_AMBULATORY_CARE_PROVIDER_SITE_OTHER): Payer: Medicare Other | Admitting: Family Medicine

## 2019-07-06 ENCOUNTER — Other Ambulatory Visit: Payer: Self-pay

## 2019-07-06 ENCOUNTER — Encounter: Payer: Self-pay | Admitting: Family Medicine

## 2019-07-06 VITALS — BP 124/72 | HR 72 | Temp 98.3°F | Ht 64.0 in | Wt 172.0 lb

## 2019-07-06 DIAGNOSIS — M79605 Pain in left leg: Secondary | ICD-10-CM

## 2019-07-06 DIAGNOSIS — M79604 Pain in right leg: Secondary | ICD-10-CM | POA: Diagnosis not present

## 2019-07-06 DIAGNOSIS — I4891 Unspecified atrial fibrillation: Secondary | ICD-10-CM | POA: Diagnosis not present

## 2019-07-06 NOTE — Progress Notes (Signed)
Phone [phone]   Subjective:  Bridget Lane is a 85 y.o. year old very pleasant female patient who presents for/with See problem oriented charting Chief Complaint  Patient presents with  . Follow-up  . Leg Pain   ROS-no fever/chills.  No cough or congestion.  Leg pain and weakness improving  Past Medical History-  Patient Active Problem List   Diagnosis Date Noted  . 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, estrogen receptor positive ([place]) 06/03/2018    Priority: High  . Atrial fibrillation 06/14/2011    Priority: High  . Insomnia 08/25/2015    Priority: Medium  . Spinal stenosis in cervical region 05/29/2015    Priority: Medium  . Hyperglycemia 06/07/2014    Priority: Medium  . Hyperlipidemia 01/28/2008    Priority: Medium  . Cervical lymphadenopathy 05/16/2016    Priority: Low  . History of Helicobacter pylori infection 07/06/2014    Priority: Low  . OSA (obstructive sleep apnea) 02/11/2012    Priority: Low  . Long term (current) use of anticoagulants 07/19/2011    Priority: Low  . Chest pain 06/14/2011    Priority: Low  . Depression 01/30/2010    Priority: Low  . History of colonic polyps 01/30/2010    Priority: Low  . ESOPHAGEAL STRICTURE 07/20/2008    Priority: Low  . Osteoarthritis 07/20/2008    Priority: Low  . Multiple thyroid nodules 01/28/2008    Priority: Low  . GERD 01/28/2008    Priority: Low  . Osteoporosis 10/30/2018  . Genetic testing 07/02/2018  . Dyspnea on exertion 03/23/2018  . Constipation 08/29/2017    Medications- reviewed and updated Current Outpatient Medications  Medication Sig Dispense Refill  . apixaban (ELIQUIS) 5 MG TABS tablet Take 1 tablet (5 mg total) by mouth 2 (two) times daily. 180 tablet 1  . flecainide (TAMBOCOR) 100 MG tablet Take 1 tablet (100 mg total) by mouth 2 (two) times daily. 180 tablet 1  . furosemide (LASIX) 20 MG tablet Take 1 tablet (20 mg total) by mouth daily. 90 tablet  3  . ibuprofen (ADVIL,MOTRIN) 600 MG tablet Take 1 tablet (600 mg total) by mouth every 8 (eight) hours as needed. 30 tablet 2  . letrozole (FEMARA) 2.5 MG tablet TAKE 1 TABLET(2.5 MG) BY MOUTH DAILY 90 tablet 3  . metoprolol tartrate (LOPRESSOR) 25 MG tablet TAKE 1/2 TABLET(12.5 MG) BY MOUTH TWICE DAILY 30 tablet 7  . Multiple Vitamins-Minerals (PRESERVISION AREDS 2+MULTI VIT PO) Take by mouth daily.    Marland Kitchen omeprazole (PRILOSEC) 40 MG capsule TAKE 1 CAPSULE(40 MG) BY MOUTH TWICE DAILY 60 capsule 5  . traMADol (ULTRAM) 50 MG tablet Take 0.5-1 tablets (25-50 mg total) by mouth every 12 (twelve) hours as needed for moderate pain or severe pain. 20 tablet 0   No current facility-administered medications for this visit.      Objective:  BP 124/72 (BP Location: Left Arm, Patient Position: Sitting, Cuff Size: Normal)   Pulse 72   Temp 98.3 F (36.8 C) (Temporal)   Ht 5\' 4"  (1.626 m)   Wt 172 lb (78 kg)   SpO2 98%   BMI 29.52 kg/m  Gen: NAD, resting comfortably CV: RRR no murmurs rubs or gallops Lungs: CTAB no crackles, wheeze, rhonchi Rises out of her chair much more easily today, does not appear in any significant distress today.    Assessment and Plan   #Bilateral Leg Pain S: Patient was seen June 18, 2019 with slight bilateral  leg pain and weakness- she had responded well in the past to a course of prednisone with Dr. Paulla Fore several months prior- we prescribed prednisone again and she completed course of Prednisone.  She did note slight weight gain and swelling in her legs on prednisone but that has improved since coming off medication.  We referred her to Dr. Tamala Julian of sports medicine but she was not able to get in until September 23. X-rays on August 28 showed degenerative changes particular at L5-S1  Patient states pain had gotten so bad she almost felt like she could not live.  After she started the prednisone and also with tramadol she made significant improvement in pain levels.  She  felt like she was able to get around/move much better.  On September 11 she was close to running out of medication and she was very concerned about going through the weekend without any tramadol- from her message I was concerned she had worsening pain-I sent in tramadol again.  I put in a referral for her to Dr. Junius Roads to see if we could get her in more quickly- once tramadol was refilled she felt more confident in waiting and states pain continued to improve over the weekend.  Current pain is 5/10 pain with tramadol but she states this is significantly improved and livable.  She feels much stronger in her legs/denies only very mild left leg weakness.  A/P: Bilateral leg pain much improved with course of prednisone and also with tramadol on hand for as needed pain.  X-rays without any mass or fracture.  Pending visit to Dr. Tamala Julian of sports medicine in 8 days.  Since she is feeling better did not want to push for more immediate follow-up with Dr. Arta Bruce reasonable to wait at this point.  If she still feels like she needs more tramadol at time of visit with Dr. Sammie Bench. Tamala Julian can message me- im happy to refill tramadol again if still needed at that visit.   #Atrial fibrillation/possible diastolic CHF S: Patient is compliant with Eliquis 5 mg twice a day for anticoagulation.  She remains on flecainide and metoprolol.  She did note some swelling with prednisone as well as weight gain-this resolved with continuing her Lasix- she reports taking this every other day right now.  Notes per Dr. Saunders Revel previously mentioned possible diastolic heart failure but this has not been formally placed on problem list-she will continue Lasix every other day A/P: Atrial fibrillation is stable-continue current medications.  Continue Lasix every other day and she will let us know if swelling worsens  Recommended follow up: I recommended at least every 32-month follow-up Future Appointments  Date Time Provider [place]  07/14/2019  2:45 PM Lyndal Pulley, DO [facility]  09/01/2019 11:30 AM Malka So R, PA [facility] None  04/05/2020  8:45 AM [name], MD [facility] None   Lab/Order associations:   ICD-10-CM   1. Bilateral leg pain  M79.604    M79.605   2. Atrial fibrillation, unspecified type ([place])  I48.91    Return precautions advised.  Garret Reddish, MD

## 2019-07-06 NOTE — Patient Instructions (Addendum)
Continue tramadol as needed until you see Dr. Tamala Julian- if you still need more at that point- have him send me a message and I can refill it- hopefully it will continue to get better and you will need less and less.   Glad swelling went back down after the prednisone  See me if needed for new or worsening symptoms

## 2019-07-14 ENCOUNTER — Encounter: Payer: Self-pay | Admitting: Family Medicine

## 2019-07-14 ENCOUNTER — Other Ambulatory Visit: Payer: Self-pay

## 2019-07-14 ENCOUNTER — Ambulatory Visit (INDEPENDENT_AMBULATORY_CARE_PROVIDER_SITE_OTHER): Payer: Medicare Other | Admitting: Family Medicine

## 2019-07-14 DIAGNOSIS — M2550 Pain in unspecified joint: Secondary | ICD-10-CM

## 2019-07-14 NOTE — Progress Notes (Signed)
[facility] [place], [place] [address] Phone: [phone] Subjective:   I Bridget Lane am serving as a Education administrator for Dr. Hulan Saas.  I'm seeing this patient by the request  of:    CC: Knee joint pain  [id number]  Bridget Lane is a 85 y.o. female coming in with complaint of joint pain. Patient states she has pain all over. [place] about 6-7 months ago. Before that she states she was fairly active. Has had xrays. States that she has bruised her muscle. States that she hurts from the toes to the top of her head. No numbness and tingling noted.   Onset- Chronic  Location - everywhere  Duration-  Character-aching sensation that is severe. Aggravating factors- ADL Reliving factors-  Therapies tried- Asprin  Severity- 5/10   Patient had difficulty with keeping track.  Patient states that she initially was seen by different provider and was given a different medication.  Shows me a tramadol bottle that was given by her primary care provider.  Patient was adamant that it was not hand who prescribed it initially.  States that she was told by primary care provider that I need to refill this medication.  She states initially that she has been taking 1/2 pill daily and then changed her story that she has not had any for 2 days but does not know if it is making a difference.  Past Medical History:  Diagnosis Date  . Atrial fibrillation ([place])    persistent  . Colon polyp 2005  . Depression   . DJD (degenerative joint disease)   . Dysrhythmia   . Esophageal stricture   . Family history of breast cancer   . Family history of esophageal cancer   . Family history of prostate cancer   . GERD (gastroesophageal reflux disease)   . H. pylori infection   . Hiatal hernia   . Hyperlipidemia   . Macular degeneration   . Nontoxic multinodular goiter   . OA (osteoarthritis)   . Postherpetic neuralgia at T3-T5 level 04/27/2011  . Rectal bleeding 07/06/2014    Hemorrhoid related in past.     Past Surgical History:  Procedure Laterality Date  . ANTERIOR CERVICAL DECOMP/DISCECTOMY FUSION N/A 05/29/2015   Procedure: ANTERIOR CERVICAL DECOMPRESSION/DISCECTOMY FUSION CERVICAL FOUR-FIVE,CERVICAL FIVE-SIX;  Surgeon: Kary Kos, MD;  Location: [facility];  Service: Neurosurgery;  Laterality: N/A;  . APPENDECTOMY    . BREAST LUMPECTOMY Left 06/2018  . BREAST LUMPECTOMY WITH RADIOACTIVE SEED LOCALIZATION Left 06/25/2018   Procedure: BREAST LUMPECTOMY WITH RADIOACTIVE SEED LOCALIZATION;  Surgeon: Erroll Luna, MD;  Location: [facility];  Service: General;  Laterality: Left;  . CARDIAC CATHETERIZATION    . CARDIOVERSION  08/29/2011   Procedure: CARDIOVERSION;  Surgeon: Bing Quarry, MD;  Location: [facility];  Service: Cardiovascular;  Laterality: N/A;  . CARDIOVERSION  11/22/2011   Procedure: CARDIOVERSION;  Surgeon: Coralyn Mark, MD;  Location: [facility];  Service: Cardiovascular;  Laterality: N/A;  . CHOLECYSTECTOMY  1989  . COLONOSCOPY W/ POLYPECTOMY  2005   Neg in 2010; Dr Olevia Perches  . ESOPHAGEAL DILATION     > 3 X; Dr Olevia Perches  . LEFT AND RIGHT HEART CATHETERIZATION WITH CORONARY ANGIOGRAM N/A 12/02/2011   Procedure: LEFT AND RIGHT HEART CATHETERIZATION WITH CORONARY ANGIOGRAM;  Surgeon: Jolaine Artist, MD;  Location: [facility];  Service: Cardiovascular;  Laterality: N/A;  . TOTAL ABDOMINAL HYSTERECTOMY  1972   for pain (  no BSO)  . TUBAL LIGATION     with appendectomy  . UPPER GI ENDOSCOPY  2010   H pylori   Social History   Socioeconomic History  . Marital status: Married    Spouse name: Not on file  . Number of children: 4  . Years of education: Not on file  . Highest education level: Not on file  Occupational History  . Occupation: retired  Scientific laboratory technician  . Financial resource strain: Not on file  . Food insecurity    Worry: Not on file    Inability: Not on file  . Transportation needs    Medical: Not on file     Non-medical: Not on file  Tobacco Use  . Smoking status: Never Smoker  . Smokeless tobacco: Never Used  Substance and Sexual Activity  . Alcohol use: No  . Drug use: No  . Sexual activity: Not on file  Lifestyle  . Physical activity    Days per week: Not on file    Minutes per session: Not on file  . Stress: Not on file  Relationships  . Social Herbalist on phone: Not on file    Gets together: Not on file    Attends religious service: Not on file    Active member of club or organization: Not on file    Attends meetings of clubs or organizations: Not on file    Relationship status: Not on file  Other Topics Concern  . Not on file  Social History Narrative   Married 1956. 4 children 2 boys 2 girls. 16 grandkids.  5 greatgrandkids.    Pt lives in [place] with spouse.        Retired from [employer]      Hobbies: travel, spend time with people, family time   Exercise-walking      No HCPOA-advised to do this.    Allergies  Allergen Reactions  . Penicillins Rash  . Codeine Other (See Comments)    hallucinations hallucinations  . Acetaminophen Other (See Comments)    hurts stomach hurts stomach  . Dabigatran Etexilate Mesylate Other (See Comments)     All extremities feel heavy and hurt  . Dabigatran Etexilate Mesylate Other (See Comments)     All extremities feel heavy and hurt  . Sulfa Antibiotics Rash  . Sulfonamide Derivatives Rash   Family History  Problem Relation Age of Onset  . Heart failure Mother   . Coronary artery disease Mother   . Diabetes Mother   . Osteoarthritis Father   . Coronary artery disease Father   . Prostate cancer Father        in 39s  . Breast cancer Sister        in 40's  . Diabetes Sister   . Esophageal cancer Brother        smoked  . Breast cancer Maternal Aunt      Current Outpatient Medications (Cardiovascular):  .  flecainide (TAMBOCOR) 100 MG tablet, Take 1 tablet (100 mg total) by mouth 2  (two) times daily. .  furosemide (LASIX) 20 MG tablet, Take 1 tablet (20 mg total) by mouth daily. .  metoprolol tartrate (LOPRESSOR) 25 MG tablet, TAKE 1/2 TABLET(12.5 MG) BY MOUTH TWICE DAILY   Current Outpatient Medications (Analgesics):  .  ibuprofen (ADVIL,MOTRIN) 600 MG tablet, Take 1 tablet (600 mg total) by mouth every 8 (eight) hours as needed. .  traMADol (ULTRAM) 50 MG tablet, Take 0.5-1 tablets (  25-50 mg total) by mouth every 12 (twelve) hours as needed for moderate pain or severe pain.  Current Outpatient Medications (Hematological):  .  apixaban (ELIQUIS) 5 MG TABS tablet, Take 1 tablet (5 mg total) by mouth 2 (two) times daily.  Current Outpatient Medications (Other):  .  letrozole (FEMARA) 2.5 MG tablet, TAKE 1 TABLET(2.5 MG) BY MOUTH DAILY .  Multiple Vitamins-Minerals (PRESERVISION AREDS 2+MULTI VIT PO), Take by mouth daily. Marland Kitchen  omeprazole (PRILOSEC) 40 MG capsule, TAKE 1 CAPSULE(40 MG) BY MOUTH TWICE DAILY    Past medical history, social, surgical and family history all reviewed in electronic medical record.  No pertanent information unless stated regarding to the chief complaint.   Review of Systems:  No  visual changes, nausea, vomiting, diarrhea, constipation, dizziness, abdominal pain, skin rash, fevers, chills, night sweats, weight loss, swollen lymph nodes chest pain, shortness of breath, mood changes.  Positive with review of systems including headaches, muscle aches, body aches, joint swelling  Objective  Blood pressure 130/76, pulse 74, height 5\' 4"  (1.626 m), weight 166 lb (75.3 kg), SpO2 96 %.     General: No apparent distress alert patient was very standoffish, interrupted significant number of times, seemed very preoccupied with a refill for pain medication.  Was not oriented. HEENT: Pupils equal, extraocular movements intact  Respiratory: Patient's speak in full sentences and does not appear short of breath  Cardiovascular: Trace lower extremity edema,  non tender, no erythema  Skin: Warm dry intact with no signs of infection or rash on extremities or on axial skeleton.  Abdomen: Soft nontender  Neuro: Cranial nerves II through XII are intact, neurovascularly intact in all extremities with 2+ DTRs and 2+ pulses.  Lymph: No lymphadenopathy of posterior or anterior cervical chain or axillae bilaterally.  Gait normal with good balance and coordination.  MSK:  tender the patient did not allow for good physical exam but some mild arthritic changes noted of most of the joints y.     Impression and Recommendations:     This case required medical decision making of moderate complexity. The above documentation has been reviewed and is accurate and complete Lyndal Pulley, DO       Note: This dictation was prepared with Dragon dictation along with smaller phrase technology. Any transcriptional errors that result from this process are unintentional.

## 2019-07-14 NOTE — Patient Instructions (Addendum)
Good to see you.  Ice 20 minutes 2 times daily. Usually after activity and before bed. Exercises 3 times a week.  pennsaid pinkie amount topically 2 times daily as needed on the legs  Tart cherry extract 1200mg  at night Vitamin D 2000 IU daily  When you take your tramadol take a tylenol up to 500 mg with it See me again in 2 months

## 2019-07-15 ENCOUNTER — Encounter: Payer: Self-pay | Admitting: Family Medicine

## 2019-07-15 DIAGNOSIS — M2550 Pain in unspecified joint: Secondary | ICD-10-CM | POA: Insufficient documentation

## 2019-07-15 NOTE — Assessment & Plan Note (Signed)
Unfortunately a very difficult encounter with patient.  Patient seemed to be significantly preoccupied with the pain medication.  Unfortunately patient was unable to tell me how often she was truly taking the tramadol.  I did discuss my concerns with her taking them regularly at her age as well as with her other comorbidities.  We discussed multiple different medications which patient was adamant when lab work.  Patient was showing significant signs unfortunately of dementia and may be some depression but was not willing to discuss with me.  We discussed that of all my patients I have less than 5 chronically on opiates and if we decided to do that we will need to continue and start a pain medicine contract which patient was not willing to do.  We discussed other natural aspects or taking Tylenol with the tramadol for a synergistic effect but can actually last longer.  Home exercises given to help with patient's back.  Discussed if she would like to live more than willing to see her again in the next 4 weeks and discuss continued care.  Patient declined other treatment options including physical therapy at this time

## 2019-08-30 ENCOUNTER — Telehealth: Payer: Self-pay | Admitting: Family Medicine

## 2019-08-30 NOTE — Telephone Encounter (Signed)
I left a message asking the patient to call and schedule Medicare AWV with Courtney ([facility]).  If patient calls back, please schedule Medicare Wellness Visit at next available opening.  VDM (Dee-Dee) °

## 2019-09-01 ENCOUNTER — Ambulatory Visit (HOSPITAL_COMMUNITY)
Admission: RE | Admit: 2019-09-01 | Discharge: 2019-09-01 | Disposition: A | Discharge status: Home / Self Care | Payer: Medicare Other | Source: Ambulatory Visit | Attending: Physician Assistant | Admitting: Physician Assistant

## 2019-09-01 ENCOUNTER — Encounter (HOSPITAL_COMMUNITY): Payer: Self-pay | Admitting: Physician Assistant

## 2019-09-01 ENCOUNTER — Other Ambulatory Visit: Payer: Self-pay

## 2019-09-01 VITALS — BP 128/76 | HR 54 | Ht 64.0 in | Wt 167.2 lb

## 2019-09-01 DIAGNOSIS — E785 Hyperlipidemia, unspecified: Secondary | ICD-10-CM | POA: Diagnosis not present

## 2019-09-01 DIAGNOSIS — K449 Diaphragmatic hernia without obstruction or gangrene: Secondary | ICD-10-CM | POA: Insufficient documentation

## 2019-09-01 DIAGNOSIS — Z7901 Long term (current) use of anticoagulants: Secondary | ICD-10-CM | POA: Insufficient documentation

## 2019-09-01 DIAGNOSIS — Z79899 Other long term (current) drug therapy: Secondary | ICD-10-CM | POA: Diagnosis not present

## 2019-09-01 DIAGNOSIS — K219 Gastro-esophageal reflux disease without esophagitis: Secondary | ICD-10-CM | POA: Diagnosis not present

## 2019-09-01 DIAGNOSIS — D6869 Other thrombophilia: Secondary | ICD-10-CM | POA: Diagnosis not present

## 2019-09-01 DIAGNOSIS — I480 Paroxysmal atrial fibrillation: Secondary | ICD-10-CM

## 2019-09-01 DIAGNOSIS — I2510 Atherosclerotic heart disease of native coronary artery without angina pectoris: Secondary | ICD-10-CM | POA: Insufficient documentation

## 2019-09-01 NOTE — Progress Notes (Addendum)
Primary Care Physician: [name], MD Primary Cardiologist: Dr Acie Fredrickson Primary Electrophysiologist: Dr Rayann Heman Referring Physician: Dr End   Bridget Lane is a 85 y.o. female with a history of paroxysmal atrial fibrillation, HLD, breast cancer, non obstructive CAD who presents for follow up in the [facility]. She was diagnosed with afib in 2012 and has been maintained on flecainide and Eliquis. She saw Dr Acie Fredrickson for routine follow up on 05/28/19 and was found to be in rate controlled afib. She admits that for the previous few nights she has not been sleeping well 2/2 arthritis pain. She denies any other specific triggers. She was back in SR on follow up in the AF clinic 06/01/19. She is on Eliquis for a CHADS2VASC score of 3.  On follow up today, patient reports that she has done well since her last visit. She denies heart racing or palpitations. She does admit that she has been more sedentary since she fell earlier this year.   Today, she denies symptoms of palpitations, chest pain, orthopnea, PND, lower extremity edema, dizziness, presyncope, syncope, snoring, daytime somnolence, bleeding, or neurologic sequela. The patient is tolerating medications without difficulties and is otherwise without complaint today.     she has a BMI of Body mass index is 28.7 kg/m.Marland Kitchen Filed Weights   09/01/19 1142  Weight: 75.8 kg    Family History  Problem Relation Age of Onset   Heart failure Mother    Coronary artery disease Mother    Diabetes Mother    Osteoarthritis Father    Coronary artery disease Father    Prostate cancer Father        in 62s   Breast cancer Sister        in 56's   Diabetes Sister    Esophageal cancer Brother        smoked   Breast cancer Maternal Aunt      Atrial Fibrillation Management history:  Previous antiarrhythmic drugs: flecainide Previous cardioversions: 2012, 2013 Previous ablations: none CHADS2VASC score:  3 Anticoagulation history: Eliquis   Past Medical History:  Diagnosis Date   Atrial fibrillation ([place])    persistent   Colon polyp 2005   Depression    DJD (degenerative joint disease)    Dysrhythmia    Esophageal stricture    Family history of breast cancer    Family history of esophageal cancer    Family history of prostate cancer    GERD (gastroesophageal reflux disease)    H. pylori infection    Hiatal hernia    Hyperlipidemia    Macular degeneration    Nontoxic multinodular goiter    OA (osteoarthritis)    Postherpetic neuralgia at T3-T5 level 04/27/2011   Rectal bleeding 07/06/2014   Hemorrhoid related in past.     Past Surgical History:  Procedure Laterality Date   ANTERIOR CERVICAL DECOMP/DISCECTOMY FUSION N/A 05/29/2015   Procedure: ANTERIOR CERVICAL DECOMPRESSION/DISCECTOMY FUSION CERVICAL FOUR-FIVE,CERVICAL FIVE-SIX;  Surgeon: Kary Kos, MD;  Location: [facility];  Service: Neurosurgery;  Laterality: N/A;   APPENDECTOMY     BREAST LUMPECTOMY Left 06/2018   BREAST LUMPECTOMY WITH RADIOACTIVE SEED LOCALIZATION Left 06/25/2018   Procedure: BREAST LUMPECTOMY WITH RADIOACTIVE SEED LOCALIZATION;  Surgeon: Erroll Luna, MD;  Location: [facility];  Service: General;  Laterality: Left;   CARDIAC CATHETERIZATION     CARDIOVERSION  08/29/2011   Procedure: CARDIOVERSION;  Surgeon: Bing Quarry, MD;  Location: [facility];  Service: Cardiovascular;  Laterality: N/A;   CARDIOVERSION  11/22/2011   Procedure: CARDIOVERSION;  Surgeon: Coralyn Mark, MD;  Location: [facility];  Service: Cardiovascular;  Laterality: N/A;   CHOLECYSTECTOMY  1989   COLONOSCOPY W/ POLYPECTOMY  2005   Neg in 2010; Dr Olevia Perches   ESOPHAGEAL DILATION     > 3 X; Dr Olevia Perches   LEFT AND RIGHT HEART CATHETERIZATION WITH CORONARY ANGIOGRAM N/A 12/02/2011   Procedure: LEFT AND RIGHT HEART CATHETERIZATION WITH CORONARY ANGIOGRAM;  Surgeon: Jolaine Artist, MD;  [facility];  Service: Cardiovascular;  Laterality: N/A;   TOTAL ABDOMINAL HYSTERECTOMY  1972   for pain (no BSO)   TUBAL LIGATION     with appendectomy   UPPER GI ENDOSCOPY  2010   H pylori    Current Outpatient Medications  Medication Sig Dispense Refill   apixaban (ELIQUIS) 5 MG TABS tablet Take 1 tablet (5 mg total) by mouth 2 (two) times daily. 180 tablet 1   flecainide (TAMBOCOR) 100 MG tablet Take 1 tablet (100 mg total) by mouth 2 (two) times daily. 180 tablet 1   furosemide (LASIX) 20 MG tablet Take 1 tablet (20 mg total) by mouth daily. 90 tablet 3   ibuprofen (ADVIL,MOTRIN) 600 MG tablet Take 1 tablet (600 mg total) by mouth every 8 (eight) hours as needed. 30 tablet 2   letrozole (FEMARA) 2.5 MG tablet TAKE 1 TABLET(2.5 MG) BY MOUTH DAILY 90 tablet 3   metoprolol tartrate (LOPRESSOR) 25 MG tablet TAKE 1/2 TABLET(12.5 MG) BY MOUTH TWICE DAILY 30 tablet 7   Multiple Vitamins-Minerals (PRESERVISION AREDS 2+MULTI VIT PO) Take by mouth daily.     omeprazole (PRILOSEC) 40 MG capsule TAKE 1 CAPSULE(40 MG) BY MOUTH TWICE DAILY 60 capsule 5   No current facility-administered medications for this encounter.     Allergies  Allergen Reactions   Penicillins Rash   Codeine Other (See Comments)    hallucinations hallucinations   Acetaminophen Other (See Comments)    hurts stomach hurts stomach   Dabigatran Etexilate Mesylate Other (See Comments)     All extremities feel heavy and hurt   Dabigatran Etexilate Mesylate Other (See Comments)     All extremities feel heavy and hurt   Sulfa Antibiotics Rash   Sulfonamide Derivatives Rash    Social History   Socioeconomic History   Marital status: Married    Spouse name: Not on file   Number of children: 4   Years of education: Not on file   Highest education level: Not on file  Occupational History   Occupation: retired  Scientist, product/process development strain: Not on file   Food insecurity    Worry: Not  on file    Inability: Not on Lexicographer needs    Medical: Not on file    Non-medical: Not on file  Tobacco Use   Smoking status: Never Smoker   Smokeless tobacco: Never Used  Substance and Sexual Activity   Alcohol use: No   Drug use: No   Sexual activity: Not on file  Lifestyle   Physical activity    Days per week: Not on file    Minutes per session: Not on file   Stress: Not on file  Relationships   Social connections    Talks on phone: Not on file    Gets together: Not on file    Attends religious service: Not on file    Active member of club or organization:  Not on file    Attends meetings of clubs or organizations: Not on file    Relationship status: Not on file   Intimate partner violence    Fear of current or ex partner: Not on file    Emotionally abused: Not on file    Physically abused: Not on file    Forced sexual activity: Not on file  Other Topics Concern   Not on file  Social History Narrative   Married 1956. 4 children 2 boys 2 girls. 16 grandkids.  5 greatgrandkids.    Pt lives in [place] with spouse.        Retired from [employer]      Hobbies: travel, spend time with people, family time   Exercise-walking      No HCPOA-advised to do this.      ROS- All systems are reviewed and negative except as per the HPI above.  Physical Exam: Vitals:   09/01/19 1142  BP: 128/76  Pulse: (!) 54  SpO2: 97%  Weight: 75.8 kg  Height: 5\' 4"  (1.626 m)    GEN- The patient is well appearing elderly female, alert and oriented x 3 today.   HEENT-head normocephalic, atraumatic, sclera clear, conjunctiva pink, hearing intact, trachea midline. Lungs- Clear to ausculation bilaterally, normal work of breathing Heart- Regular rate and rhythm, no murmurs, rubs or gallops  GI- soft, NT, ND, + BS Extremities- no clubbing, cyanosis, or edema MS- no significant deformity or atrophy Skin- no rash or lesion Psych- euthymic  mood, full affect Neuro- strength and sensation are intact   Wt Readings from Last 3 Encounters:  09/01/19 75.8 kg  07/14/19 75.3 kg  07/06/19 78 kg    EKG today demonstrates SB HR 54, 1st degree AV block, PR 214 ,QRS 108, QTc 443  Echo 10/13/17 demonstrated  - Left ventricle: The cavity size was normal. Wall thickness was   normal. Systolic function was normal. The estimated ejection   fraction was in the range of 55% to 60%. Wall motion was normal;   there were no regional wall motion abnormalities. - Aortic valve: There was trivial regurgitation. - Mitral valve: There was mild regurgitation. - Pulmonary arteries: PA peak pressure: 33 mm Hg (S).  Impressions:  - Normal LV systolic function; trace AI; mild MR and TR.  Nuclear stress test 10/27/2017 EF 55, apical lateral and apical defect likely breast attenuation, no ischemia; low risk  Epic records are reviewed at length today  Assessment and Plan:  1. Paroxysmal atrial fibrillation Patient appears to be maintaining SR. Continue Eliquis 5 mg BID Continue flecainide 100 mg BID Continue Lopressor 12.5 mg BID We discussed again today that if her afib becomes more persistent/symptomatic will consider change in AAD. Patient happy with current regiment.   This patients CHA2DS2-VASc Score and unadjusted Ischemic Stroke Rate (% per year) is equal to 3.2 % stroke rate/year from a score of 3  Above score calculated as 1 point each if present [CHF, HTN, DM, Vascular=MI/PAD/Aortic Plaque, Age if 65-74, or Female] Above score calculated as 2 points each if present [Age > 75, or Stroke/TIA/TE]   Follow up with Dr Acie Fredrickson per recall. AF clinic as needed.   [facility] [address] [place], [place] [address] [phone] 09/01/2019 11:57 AM

## 2019-09-16 ENCOUNTER — Other Ambulatory Visit: Payer: Self-pay | Admitting: Physician Assistant

## 2019-09-17 ENCOUNTER — Other Ambulatory Visit: Payer: Self-pay | Admitting: Physician Assistant

## 2019-09-20 ENCOUNTER — Other Ambulatory Visit: Payer: Self-pay | Admitting: Physician Assistant

## 2019-09-20 MED ORDER — APIXABAN 5 MG PO TABS: 5.0000 mg | ORAL_TABLET | Freq: Two times a day (BID) | ORAL | 1 refills | Status: DC

## 2019-09-21 ENCOUNTER — Other Ambulatory Visit: Payer: Self-pay

## 2019-09-21 ENCOUNTER — Ambulatory Visit (INDEPENDENT_AMBULATORY_CARE_PROVIDER_SITE_OTHER): Payer: Medicare Other | Admitting: Family Medicine

## 2019-09-21 ENCOUNTER — Other Ambulatory Visit (INDEPENDENT_AMBULATORY_CARE_PROVIDER_SITE_OTHER): Payer: Medicare Other

## 2019-09-21 ENCOUNTER — Encounter: Payer: Self-pay | Admitting: Family Medicine

## 2019-09-21 VITALS — BP 128/68 | Ht 64.0 in | Wt 167.0 lb

## 2019-09-21 DIAGNOSIS — M2550 Pain in unspecified joint: Secondary | ICD-10-CM

## 2019-09-21 LAB — CBC WITH DIFFERENTIAL/PLATELET
Basophils Absolute: 0.1 10*3/uL (ref 0.0–0.1)
Basophils Relative: 0.9 % (ref 0.0–3.0)
Eosinophils Absolute: 0.1 10*3/uL (ref 0.0–0.7)
Eosinophils Relative: 1.8 % (ref 0.0–5.0)
HCT: 35.6 % — ABNORMAL LOW (ref 36.0–46.0)
Hemoglobin: 11.9 g/dL — ABNORMAL LOW (ref 12.0–15.0)
Lymphocytes Relative: 20.8 % (ref 12.0–46.0)
Lymphs Abs: 1.5 10*3/uL (ref 0.7–4.0)
MCHC: 33.4 g/dL (ref 30.0–36.0)
MCV: 86.4 fl (ref 78.0–100.0)
Monocytes Absolute: 0.4 10*3/uL (ref 0.1–1.0)
Monocytes Relative: 6.1 % (ref 3.0–12.0)
Neutro Abs: 5.2 10*3/uL (ref 1.4–7.7)
Neutrophils Relative %: 70.4 % (ref 43.0–77.0)
Platelets: 289 10*3/uL (ref 150.0–400.0)
RBC: 4.12 Mil/uL (ref 3.87–5.11)
RDW: 14.3 % (ref 11.5–15.5)
WBC: 7.4 10*3/uL (ref 4.0–10.5)

## 2019-09-21 LAB — COMPREHENSIVE METABOLIC PANEL
ALT: 16 U/L (ref 0–35)
AST: 22 U/L (ref 0–37)
Albumin: 4.2 g/dL (ref 3.5–5.2)
Alkaline Phosphatase: 75 U/L (ref 39–117)
BUN: 28 mg/dL — ABNORMAL HIGH (ref 6–23)
CO2: 26 mEq/L (ref 19–32)
Calcium: 9.6 mg/dL (ref 8.4–10.5)
Chloride: 107 mEq/L (ref 96–112)
Creatinine, Ser: 1.31 mg/dL — ABNORMAL HIGH (ref 0.40–1.20)
GFR: 38.64 mL/min — ABNORMAL LOW (ref >60.00)
Glucose, Bld: 109 mg/dL — ABNORMAL HIGH (ref 70–99)
Potassium: 3.8 mEq/L (ref 3.5–5.1)
Sodium: 143 mEq/L (ref 135–145)
Total Bilirubin: 0.7 mg/dL (ref 0.2–1.2)
Total Protein: 7.1 g/dL (ref 6.0–8.3)

## 2019-09-21 LAB — FERRITIN: Ferritin: 28 ng/mL (ref 10.0–291.0)

## 2019-09-21 LAB — IBC PANEL
Iron: 79 ug/dL (ref 42–145)
Saturation Ratios: 19.3 % — ABNORMAL LOW (ref 20.0–50.0)
Transferrin: 293 mg/dL (ref 212.0–360.0)

## 2019-09-21 LAB — TSH: TSH: 0.92 u[IU]/mL (ref 0.35–4.50)

## 2019-09-21 LAB — URIC ACID: Uric Acid, Serum: 9 mg/dL — ABNORMAL HIGH (ref 2.4–7.0)

## 2019-09-21 LAB — SEDIMENTATION RATE: Sed Rate: 18 mm/hr (ref 0–30)

## 2019-09-21 LAB — C-REACTIVE PROTEIN: CRP: <1 mg/dL (ref 0.5–20.0)

## 2019-09-21 MED ORDER — GABAPENTIN 100 MG PO CAPS: 200.0000 mg | ORAL_CAPSULE | Freq: Every day | ORAL | 3 refills | Status: DC

## 2019-09-21 NOTE — Assessment & Plan Note (Signed)
Continues to have pain out of proportion.  Do believe there is some underlying dementia that is also contributing.  We discussed with patient about icing regimen, home exercise, which activities to do which wants to avoid.  We discussed laboratory work-up to rule out such things as gout that could be contributing to some of the discomfort and pain.  Discussed following up with primary care provider in the near future as well.  Spent  25 minutes with patient face-to-face and had greater than 50% of counseling including as described above in assessment and plan.

## 2019-09-21 NOTE — Patient Instructions (Addendum)
Gabapentin 100 mg at night for sleep and pain Labs downstairs Pennsaid on the painful spot 2 times a day See me again in 3 months      [address], [address]st floor [place], [place] [address] Phone [phone]

## 2019-09-21 NOTE — Progress Notes (Signed)
[facility] [place], [place] [address] Phone: [phone] Subjective:   Fontaine No, am serving as a scribe for Dr. Hulan Saas.   This visit occurred during the SARS-CoV-2 public health emergency.  Safety protocols were in place, including screening questions prior to the visit, additional usage of staff PPE, and extensive cleaning of exam room while observing appropriate contact time as indicated for disinfecting solutions.   I'm seeing this patient by the request  of:    CC: All over pain follow-up  [id number]   07/14/2019 Unfortunately a very difficult encounter with patient.  Patient seemed to be significantly preoccupied with the pain medication.  Unfortunately patient was unable to tell me how often she was truly taking the tramadol.  I did discuss my concerns with her taking them regularly at her age as well as with her other comorbidities.  We discussed multiple different medications which patient was adamant when lab work.  Patient was showing significant signs unfortunately of dementia and may be some depression but was not willing to discuss with me.  We discussed that of all my patients I have less than 5 chronically on opiates and if we decided to do that we will need to continue and start a pain medicine contract which patient was not willing to do.  We discussed other natural aspects or taking Tylenol with the tramadol for a synergistic effect but can actually last longer.  Home exercises given to help with patient's back.  Discussed if she would like to live more than willing to see her again in the next 4 weeks and discuss continued care.  Patient declined other treatment options including physical therapy at this time  Update 09/21/2019 ASAKO ALLEGRO is a 85 y.o. female coming in with complaint of polyarthralgia. Patient states that she is able to walk better than she was but continues to have pain throughout her entire body.  Complaining mainly of bilateral knee and right shoulder pain. States that her pain was exacerbated after a fall 6 months ago. States that she is not taking anything for her pain.  Patient then at the same time tells her that she is taking ibuprofen daily.  Even though she is not taking anything for the pain     Past Medical History:  Diagnosis Date   Atrial fibrillation ([place])    persistent   Colon polyp 2005   Depression    DJD (degenerative joint disease)    Dysrhythmia    Esophageal stricture    Family history of breast cancer    Family history of esophageal cancer    Family history of prostate cancer    GERD (gastroesophageal reflux disease)    H. pylori infection    Hiatal hernia    Hyperlipidemia    Macular degeneration    Nontoxic multinodular goiter    OA (osteoarthritis)    Postherpetic neuralgia at T3-T5 level 04/27/2011   Rectal bleeding 07/06/2014   Hemorrhoid related in past.     Past Surgical History:  Procedure Laterality Date   ANTERIOR CERVICAL DECOMP/DISCECTOMY FUSION N/A 05/29/2015   Procedure: ANTERIOR CERVICAL DECOMPRESSION/DISCECTOMY FUSION CERVICAL FOUR-FIVE,CERVICAL FIVE-SIX;  Surgeon: Kary Kos, MD;  Location: [facility];  Service: Neurosurgery;  Laterality: N/A;   APPENDECTOMY     BREAST LUMPECTOMY Left 06/2018   BREAST LUMPECTOMY WITH RADIOACTIVE SEED LOCALIZATION Left 06/25/2018   Procedure: BREAST LUMPECTOMY WITH RADIOACTIVE SEED LOCALIZATION;  Surgeon: Erroll Luna, MD;  Location:  [facility];  Service: General;  Laterality: Left;   CARDIAC CATHETERIZATION     CARDIOVERSION  08/29/2011   Procedure: CARDIOVERSION;  Surgeon: Bing Quarry, MD;  Location: [facility];  Service: Cardiovascular;  Laterality: N/A;   CARDIOVERSION  11/22/2011   Procedure: CARDIOVERSION;  Surgeon: Coralyn Mark, MD;  Location: [facility];  Service: Cardiovascular;  Laterality: N/A;   CHOLECYSTECTOMY  1989   COLONOSCOPY W/ POLYPECTOMY  2005    Neg in 2010; Dr Olevia Perches   ESOPHAGEAL DILATION     > 3 X; Dr Olevia Perches   LEFT AND RIGHT HEART CATHETERIZATION WITH CORONARY ANGIOGRAM N/A 12/02/2011   Procedure: LEFT AND [place];  Surgeon: Jolaine Artist, MD;  Location: [facility];  Service: Cardiovascular;  Laterality: N/A;   TOTAL ABDOMINAL HYSTERECTOMY  1972   for pain (no BSO)   TUBAL LIGATION     with appendectomy   UPPER GI ENDOSCOPY  2010   H pylori   Social History   Socioeconomic History   Marital status: Married    Spouse name: Not on file   Number of children: 4   Years of education: Not on file   Highest education level: Not on file  Occupational History   Occupation: retired  Scientist, product/process development strain: Not on file   Food insecurity    Worry: Not on file    Inability: Not on Lexicographer needs    Medical: Not on file    Non-medical: Not on file  Tobacco Use   Smoking status: Never Smoker   Smokeless tobacco: Never Used  Substance and Sexual Activity   Alcohol use: No   Drug use: No   Sexual activity: Not on file  Lifestyle   Physical activity    Days per week: Not on file    Minutes per session: Not on file   Stress: Not on file  Relationships   Social connections    Talks on phone: Not on file    Gets together: Not on file    Attends religious service: Not on file    Active member of club or organization: Not on file    Attends meetings of clubs or organizations: Not on file    Relationship status: Not on file  Other Topics Concern   Not on file  Social History Narrative   Married 1956. 4 children 2 boys 2 girls. 16 grandkids.  5 greatgrandkids.    Pt lives in [place] with spouse.        Retired from [employer]      Hobbies: travel, spend time with people, family time   Exercise-walking      No HCPOA-advised to do this.    Allergies  Allergen Reactions   Penicillins Rash    Codeine Other (See Comments)    hallucinations hallucinations   Acetaminophen Other (See Comments)    hurts stomach hurts stomach   Dabigatran Etexilate Mesylate Other (See Comments)     All extremities feel heavy and hurt   Dabigatran Etexilate Mesylate Other (See Comments)     All extremities feel heavy and hurt   Sulfa Antibiotics Rash   Sulfonamide Derivatives Rash   Family History  Problem Relation Age of Onset   Heart failure Mother    Coronary artery disease Mother    Diabetes Mother    Osteoarthritis Father    Coronary artery disease Father    Prostate  cancer Father        in 40s   Breast cancer Sister        in 32's   Diabetes Sister    Esophageal cancer Brother        smoked   Breast cancer Maternal Aunt      Current Outpatient Medications (Cardiovascular):    flecainide (TAMBOCOR) 100 MG tablet, TAKE 1 TABLET(100 MG) BY MOUTH TWICE DAILY   furosemide (LASIX) 20 MG tablet, Take 1 tablet (20 mg total) by mouth daily.   metoprolol tartrate (LOPRESSOR) 25 MG tablet, TAKE 1/2 TABLET(12.5 MG) BY MOUTH TWICE DAILY   Current Outpatient Medications (Analgesics):    ibuprofen (ADVIL,MOTRIN) 600 MG tablet, Take 1 tablet (600 mg total) by mouth every 8 (eight) hours as needed.  Current Outpatient Medications (Hematological):    apixaban (ELIQUIS) 5 MG TABS tablet, Take 1 tablet (5 mg total) by mouth 2 (two) times daily.  Current Outpatient Medications (Other):    letrozole (FEMARA) 2.5 MG tablet, TAKE 1 TABLET(2.5 MG) BY MOUTH DAILY   Multiple Vitamins-Minerals (PRESERVISION AREDS 2+MULTI VIT PO), Take by mouth daily.   omeprazole (PRILOSEC) 40 MG capsule, TAKE 1 CAPSULE(40 MG) BY MOUTH TWICE DAILY   gabapentin (NEURONTIN) 100 MG capsule, Take 2 capsules (200 mg total) by mouth at bedtime.    Past medical history, social, surgical and family history all reviewed in electronic medical record.  No pertanent information unless stated regarding to  the chief complaint.   Review of Systems:  No headache, visual changes, nausea, vomiting, diarrhea, constipation, dizziness, abdominal pain, skin rash, fevers, chills, night sweats, weight loss, swollen lymph nodes, body aches, joint swelling,  chest pain, shortness of breath, mood changes.  Positive muscle aches  Objective  Blood pressure 128/68, height 5\' 4"  (1.626 m), weight 167 lb (75.8 kg).    General: No apparent distress alert and patient continues to be fairly standoffish and does not seem to be completely oriented HEENT: Pupils equal, extraocular movements intact  Respiratory: Patient's speak in full sentences and does not appear short of breath  Cardiovascular: No lower extremity edema, non tender, no erythema  Skin: Warm dry intact with no signs of infection or rash on extremities or on axial skeleton.  Abdomen: Soft nontender  Neuro: Cranial nerves II through XII are intact, neurovascularly intact in all extremities with 2+ DTRs and 2+ pulses.  Lymph: No lymphadenopathy of posterior or anterior cervical chain or axillae bilaterally.  Gait normal with good balance and coordination.  MSK:  tender with limited range of motion and stability and symmetric strength and tone of shoulders, elbows, wrist, hip, knee and ankles bilaterally.  Patient's pain is out of proportion to the amount of palpation     Impression and Recommendations:     This case required medical decision making of moderate complexity. The above documentation has been reviewed and is accurate and complete Lyndal Pulley, DO       Note: This dictation was prepared with Dragon dictation along with smaller phrase technology. Any transcriptional errors that result from this process are unintentional.

## 2019-09-21 NOTE — Telephone Encounter (Signed)
Outpatient Medication Detail   Disp Refills Start End   metoprolol tartrate (LOPRESSOR) 25 MG tablet 30 tablet 7 09/20/2019    Sig: TAKE 1/2 TABLET(12.5 MG) BY MOUTH TWICE DAILY   Sent to pharmacy as: metoprolol tartrate (LOPRESSOR) 25 MG tablet   E-Prescribing Status: Receipt confirmed by pharmacy (09/20/2019 1:29 PM EST)   [facility], [place] - [address]

## 2019-09-21 NOTE — Telephone Encounter (Signed)
Outpatient Medication Detail   Disp Refills Start End   metoprolol tartrate (LOPRESSOR) 25 MG tablet 30 tablet 7 09/20/2019    Sig: TAKE 1/2 TABLET(12.5 MG) BY MOUTH TWICE DAILY   Sent to pharmacy as: metoprolol tartrate (LOPRESSOR) 25 MG tablet   E-Prescribing Status: Receipt confirmed by pharmacy (09/20/2019 1:29 PM EST)   [facility], [place] - [address]

## 2019-09-23 ENCOUNTER — Other Ambulatory Visit: Payer: Self-pay | Admitting: Cardiovascular Disease

## 2019-09-23 LAB — PTH, INTACT AND CALCIUM
Calcium: 9.8 mg/dL (ref 8.6–10.4)
PTH: 24 pg/mL (ref 14–64)

## 2019-09-23 LAB — VITAMIN D 1,25 DIHYDROXY
Vitamin D 1, 25 (OH)2 Total: 40 pg/mL (ref 18–72)
Vitamin D2 1, 25 (OH)2: <8 pg/mL
Vitamin D3 1, 25 (OH)2: 40 pg/mL

## 2019-09-23 LAB — CYCLIC CITRUL PEPTIDE ANTIBODY, IGG: Cyclic Citrullin Peptide Ab: <16 UNITS

## 2019-09-23 LAB — ANA: Anti Nuclear Antibody (ANA): NEGATIVE

## 2019-09-23 LAB — ANGIOTENSIN CONVERTING ENZYME: Angiotensin-Converting Enzyme: 17 U/L (ref 9–67)

## 2019-09-23 LAB — RHEUMATOID FACTOR: Rheumatoid fact SerPl-aCnc: <14 IU/mL (ref <14)

## 2019-09-23 LAB — CALCIUM, IONIZED: Calcium, Ion: 5.33 mg/dL (ref 4.8–5.6)

## 2019-09-23 MED ORDER — METOPROLOL TARTRATE 25 MG PO TABS: 12.5000 mg | ORAL_TABLET | Freq: Two times a day (BID) | ORAL | 8 refills | Status: DC

## 2019-09-27 ENCOUNTER — Telehealth: Payer: Self-pay | Admitting: Family Medicine

## 2019-09-27 NOTE — Telephone Encounter (Signed)
Access Nurse Call: 09/26/2019 at 12:47pm -   Patient called and spoke to the after hours nurse line. She states that she has gout pain in her knees, arms, and all over and needs a medication for it.  Please advise?

## 2019-09-28 ENCOUNTER — Telehealth: Payer: Self-pay

## 2019-09-28 ENCOUNTER — Other Ambulatory Visit: Payer: Self-pay

## 2019-09-28 MED ORDER — COLCHICINE 0.6 MG PO TABS: 0.6000 mg | ORAL_TABLET | Freq: Every day | ORAL | 0 refills | Status: DC

## 2019-09-28 MED ORDER — ALLOPURINOL 100 MG PO TABS: 100.0000 mg | ORAL_TABLET | Freq: Two times a day (BID) | ORAL | 1 refills | Status: DC

## 2019-09-28 NOTE — Telephone Encounter (Signed)
Returned patient call about question she had about gout and medication. Left message to call back.

## 2019-09-28 NOTE — Telephone Encounter (Signed)
Patient returned call. Sent in 2 medications for gout. Patient understands instructions for medications.

## 2019-09-29 ENCOUNTER — Telehealth: Payer: Self-pay | Admitting: *Deleted

## 2019-09-29 NOTE — Telephone Encounter (Signed)
Returned patient call- left message to call back

## 2019-09-29 NOTE — Telephone Encounter (Signed)
Copied from [place] [id number]. Topic: General - Other >> Sep 29, 2019 10:18 AM Leward Quan A wrote: Reason for CRM: Patient would like a call back from Dr Tamala Julian or his nurse she need to know why she only received 5 tabs of colchicine 0.6 MG tablet. Ph# [phone]

## 2019-09-30 NOTE — Telephone Encounter (Signed)
Talked to patient. Informed her that she is to take all of the medication (colchicine) and that the medication works with the allopurinol. Patient voices understanding.

## 2019-10-07 DIAGNOSIS — H43813 Vitreous degeneration, bilateral: Secondary | ICD-10-CM | POA: Diagnosis not present

## 2019-10-07 DIAGNOSIS — H43391 Other vitreous opacities, right eye: Secondary | ICD-10-CM | POA: Diagnosis not present

## 2019-10-07 DIAGNOSIS — H353132 Nonexudative age-related macular degeneration, bilateral, intermediate dry stage: Secondary | ICD-10-CM | POA: Diagnosis not present

## 2019-10-27 ENCOUNTER — Ambulatory Visit: Payer: Medicare Other | Attending: Internal Medicine

## 2019-10-27 DIAGNOSIS — Z20822 Contact with and (suspected) exposure to covid-19: Secondary | ICD-10-CM | POA: Diagnosis not present

## 2019-10-29 LAB — NOVEL CORONAVIRUS, NAA: SARS-CoV-2, NAA: NOT DETECTED

## 2019-11-08 ENCOUNTER — Other Ambulatory Visit: Payer: Self-pay | Admitting: Cardiovascular Disease

## 2019-11-08 MED ORDER — FUROSEMIDE 20 MG PO TABS: 20.0000 mg | ORAL_TABLET | Freq: Every day | ORAL | 2 refills | Status: DC

## 2019-11-22 ENCOUNTER — Ambulatory Visit: Payer: Medicare Other

## 2019-11-25 DIAGNOSIS — H43391 Other vitreous opacities, right eye: Secondary | ICD-10-CM | POA: Diagnosis not present

## 2019-11-25 DIAGNOSIS — H43813 Vitreous degeneration, bilateral: Secondary | ICD-10-CM | POA: Diagnosis not present

## 2019-11-25 DIAGNOSIS — H59032 Cystoid macular edema following cataract surgery, left eye: Secondary | ICD-10-CM | POA: Diagnosis not present

## 2019-11-25 DIAGNOSIS — H353132 Nonexudative age-related macular degeneration, bilateral, intermediate dry stage: Secondary | ICD-10-CM | POA: Diagnosis not present

## 2019-11-25 DIAGNOSIS — H353222 Exudative age-related macular degeneration, left eye, with inactive choroidal neovascularization: Secondary | ICD-10-CM | POA: Diagnosis not present

## 2019-11-25 DIAGNOSIS — H35352 Cystoid macular degeneration, left eye: Secondary | ICD-10-CM | POA: Diagnosis not present

## 2019-11-28 ENCOUNTER — Ambulatory Visit: Payer: Medicare Other

## 2019-11-29 ENCOUNTER — Ambulatory Visit: Payer: Medicare Other

## 2019-11-29 ENCOUNTER — Ambulatory Visit: Payer: Medicare Other | Attending: Internal Medicine

## 2019-11-29 ENCOUNTER — Telehealth: Payer: Self-pay

## 2019-11-29 DIAGNOSIS — Z23 Encounter for immunization: Secondary | ICD-10-CM | POA: Insufficient documentation

## 2019-11-29 NOTE — Telephone Encounter (Signed)
Patient unhappy with care provided by Dr. Tamala Julian and other [facility] providers. Offer to patient that we can recommend other providers for her care. Patient declines at this time. Would like to cancel appoint with Dr. Tamala Julian. Future appointment cancelled.

## 2019-11-29 NOTE — Telephone Encounter (Signed)
Patient wanting a call back to discuss her gout medication and that she had a horrible flare this weekend and needs an Rx as she is in pain. Patient is really is wanting to talk to Dr. Tamala Julian, states that she feels she doesn't really get to talk to him while she is at her visits.

## 2019-11-29 NOTE — Progress Notes (Signed)
   Covid-19 Vaccination Clinic  Name:  KASON CARN    MRN: [id number] DOB: 06-Dec-1932  11/29/2019  Ms. Costilow was observed post Covid-19 immunization for 15 minutes without incidence. She was provided with Vaccine Information Sheet and instruction to access the V-Safe system.   Ms. Streicher was instructed to call 911 with any severe reactions post vaccine: Marland Kitchen Difficulty breathing  . Swelling of your face and throat  . A fast heartbeat  . A bad rash all over your body  . Dizziness and weakness    Immunizations Administered    Name Date Dose VIS Date Route   Pfizer COVID-19 Vaccine 11/29/2019  5:50 PM 0.3 mL 10/01/2019 Intramuscular   Manufacturer: [place]   Lot: [id number]   [place]: [id number]

## 2019-11-30 ENCOUNTER — Telehealth: Payer: Self-pay

## 2019-11-30 NOTE — Telephone Encounter (Signed)
Patient is calling to complain about Dr. Tamala Julian because she do not appreciate the services that she have received from that provider. Patient states that the Dr gives her 3 pills and never let her know what's the name of the medication and what's it  for. Patient would like for Dr. Yong Channel to refer her over to another provider. Pt states she do not mind going to the doctor she just want to see someone that care. Patient would like pills to help flame up because pt states that she have gout. Please return pt call.

## 2019-11-30 NOTE — Telephone Encounter (Signed)
Called and lm on pt vm tcb. Pt is also scheduled to see Dr. Yong Channel Friday and this can be further discussed at that time.

## 2019-12-01 ENCOUNTER — Telehealth: Payer: Self-pay | Admitting: Family Medicine

## 2019-12-01 NOTE — Telephone Encounter (Signed)
I called the patient to schedule AWV w/ Loma Sousa after seeing Dr. Yong Channel on 12/03/2019, but she only wants to see Dr. Yong Channel.  She said that she will discuss it with him.

## 2019-12-02 NOTE — Telephone Encounter (Signed)
I would forward to seeing her tomorrow to discuss her concerns.

## 2019-12-02 NOTE — Telephone Encounter (Signed)
Pt returned call stating that she was diagnosed with gout a few days ago and she had breast cancer a year ago and she can't take anything at all otc. She took 3 ibuprofen and 2 Tylenol which helped but she needs something stronger. She woke up yesterday morning her trunk, back and arms were purple.Pt just wanted you to be aware incase she forgot to mention it at her visit with you tomorrow.

## 2019-12-03 ENCOUNTER — Other Ambulatory Visit: Payer: Self-pay

## 2019-12-03 ENCOUNTER — Ambulatory Visit (INDEPENDENT_AMBULATORY_CARE_PROVIDER_SITE_OTHER): Payer: Medicare Other | Admitting: Family Medicine

## 2019-12-03 ENCOUNTER — Encounter: Payer: Self-pay | Admitting: Family Medicine

## 2019-12-03 VITALS — BP 120/70 | HR 88 | Temp 97.3°F | Ht 64.0 in | Wt 171.4 lb

## 2019-12-03 DIAGNOSIS — E785 Hyperlipidemia, unspecified: Secondary | ICD-10-CM

## 2019-12-03 DIAGNOSIS — R413 Other amnesia: Secondary | ICD-10-CM

## 2019-12-03 DIAGNOSIS — R52 Pain, unspecified: Secondary | ICD-10-CM | POA: Diagnosis not present

## 2019-12-03 LAB — CBC WITH DIFFERENTIAL/PLATELET
Basophils Absolute: 0.1 10*3/uL (ref 0.0–0.1)
Basophils Relative: 0.9 % (ref 0.0–3.0)
Eosinophils Absolute: 0.2 10*3/uL (ref 0.0–0.7)
Eosinophils Relative: 1.6 % (ref 0.0–5.0)
HCT: 33.1 % — ABNORMAL LOW (ref 36.0–46.0)
Hemoglobin: 10.8 g/dL — ABNORMAL LOW (ref 12.0–15.0)
Lymphocytes Relative: 17.3 % (ref 12.0–46.0)
Lymphs Abs: 1.9 10*3/uL (ref 0.7–4.0)
MCHC: 32.6 g/dL (ref 30.0–36.0)
MCV: 88.1 fl (ref 78.0–100.0)
Monocytes Absolute: 0.5 10*3/uL (ref 0.1–1.0)
Monocytes Relative: 4.8 % (ref 3.0–12.0)
Neutro Abs: 8.4 10*3/uL — ABNORMAL HIGH (ref 1.4–7.7)
Neutrophils Relative %: 75.4 % (ref 43.0–77.0)
Platelets: 315 10*3/uL (ref 150.0–400.0)
RBC: 3.76 Mil/uL — ABNORMAL LOW (ref 3.87–5.11)
RDW: 15.7 % — ABNORMAL HIGH (ref 11.5–15.5)
WBC: 11.1 10*3/uL — ABNORMAL HIGH (ref 4.0–10.5)

## 2019-12-03 LAB — COMPREHENSIVE METABOLIC PANEL
ALT: 25 U/L (ref 0–35)
AST: 31 U/L (ref 0–37)
Albumin: 4.1 g/dL (ref 3.5–5.2)
Alkaline Phosphatase: 85 U/L (ref 39–117)
BUN: 36 mg/dL — ABNORMAL HIGH (ref 6–23)
CO2: 27 mEq/L (ref 19–32)
Calcium: 9.6 mg/dL (ref 8.4–10.5)
Chloride: 104 mEq/L (ref 96–112)
Creatinine, Ser: 1.35 mg/dL — ABNORMAL HIGH (ref 0.40–1.20)
GFR: 37.31 mL/min — ABNORMAL LOW (ref >60.00)
Glucose, Bld: 110 mg/dL — ABNORMAL HIGH (ref 70–99)
Potassium: 4.4 mEq/L (ref 3.5–5.1)
Sodium: 140 mEq/L (ref 135–145)
Total Bilirubin: 0.9 mg/dL (ref 0.2–1.2)
Total Protein: 6.8 g/dL (ref 6.0–8.3)

## 2019-12-03 LAB — VITAMIN B12: Vitamin B-12: 418 pg/mL (ref 211–911)

## 2019-12-03 MED ORDER — DULOXETINE HCL 30 MG PO CPEP: ORAL_CAPSULE | ORAL | 3 refills | Status: DC

## 2019-12-03 NOTE — Progress Notes (Signed)
Phone [phone] In person visit   Subjective:   Bridget Lane is a 86 y.o. year old very pleasant female patient who presents for/with See problem oriented charting Chief Complaint  Patient presents with  . Leg Pain   This visit occurred during the SARS-CoV-2 public health emergency.  Safety protocols were in place, including screening questions prior to the visit, additional usage of staff PPE, and extensive cleaning of exam room while observing appropriate contact time as indicated for disinfecting solutions.   Past Medical History-  Patient Active Problem List   Diagnosis Date Noted  . 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, estrogen receptor positive ([place]) 06/03/2018    Priority: High  . Atrial fibrillation 06/14/2011    Priority: High  . Insomnia 08/25/2015    Priority: Medium  . Spinal stenosis in cervical region 05/29/2015    Priority: Medium  . Hyperglycemia 06/07/2014    Priority: Medium  . Hyperlipidemia 01/28/2008    Priority: Medium  . Cervical lymphadenopathy 05/16/2016    Priority: Low  . History of Helicobacter pylori infection 07/06/2014    Priority: Low  . OSA (obstructive sleep apnea) 02/11/2012    Priority: Low  . Long term (current) use of anticoagulants 07/19/2011    Priority: Low  . Chest pain 06/14/2011    Priority: Low  . Depression 01/30/2010    Priority: Low  . History of colonic polyps 01/30/2010    Priority: Low  . ESOPHAGEAL STRICTURE 07/20/2008    Priority: Low  . Osteoarthritis 07/20/2008    Priority: Low  . Multiple thyroid nodules 01/28/2008    Priority: Low  . GERD 01/28/2008    Priority: Low  . Acquired thrombophilia ([place]) 09/01/2019  . Polyarthralgia 07/15/2019  . Osteoporosis 10/30/2018  . Genetic testing 07/02/2018  . Dyspnea on exertion 03/23/2018  . Constipation 08/29/2017    Medications- reviewed and updated Current Outpatient Medications  Medication Sig Dispense Refill  .  allopurinol (ZYLOPRIM) 100 MG tablet Take 1 tablet (100 mg total) by mouth 2 (two) times daily. 60 tablet 1  . apixaban (ELIQUIS) 5 MG TABS tablet Take 1 tablet (5 mg total) by mouth 2 (two) times daily. 180 tablet 1  . colchicine 0.6 MG tablet Take 1 tablet (0.6 mg total) by mouth daily. 5 tablet 0  . flecainide (TAMBOCOR) 100 MG tablet TAKE 1 TABLET(100 MG) BY MOUTH TWICE DAILY 180 tablet 1  . furosemide (LASIX) 20 MG tablet Take 1 tablet (20 mg total) by mouth daily. 90 tablet 2  . gabapentin (NEURONTIN) 100 MG capsule Take 2 capsules (200 mg total) by mouth at bedtime. 180 capsule 3  . letrozole (FEMARA) 2.5 MG tablet TAKE 1 TABLET(2.5 MG) BY MOUTH DAILY 90 tablet 3  . metoprolol tartrate (LOPRESSOR) 25 MG tablet Take 0.5 tablets (12.5 mg total) by mouth 2 (two) times daily. 30 tablet 8  . Multiple Vitamins-Minerals (PRESERVISION AREDS 2+MULTI VIT PO) Take by mouth daily.    Marland Kitchen omeprazole (PRILOSEC) 40 MG capsule TAKE 1 CAPSULE(40 MG) BY MOUTH TWICE DAILY 60 capsule 5  . DULoxetine (CYMBALTA) 30 MG capsule Take one 30 mg tablet by mouth once a day for the first week. Then increase to two 30 mg tablets ( total 74m) by mouth once daily. 60 capsule 3   No current facility-administered medications for this visit.     Objective:  BP 120/70   Pulse 88   Temp (!) 97.3 F (36.3 C) (Temporal)   Ht  5' 4" (1.626 m)   Wt 171 lb 6.4 oz (77.7 kg)   SpO2 92%   BMI 29.42 kg/m  Gen: NAD, resting comfortably CV: RRR no murmurs rubs or gallops Lungs: CTAB no crackles, wheeze, rhonchi Ext: no edema Skin: warm, dry, did not examine bruise on right side of body as intended to due to significant patient distress about diffuse pain    Assessment and Plan  # bruising S:patient has on right side of body. Patient denies any falls. States that she woke up 2-3 days ago. She states that she thinks that it is due to the one dose of both ibuprofen and tylenol that she took on Sunday and Tuesday. Patient is  currently on Eliquis.  A/P: Senile purpura worsened by Eliquis and then with recent use of ibuprofen.  Patient knows she should avoid ibuprofen while on Eliquis-she reiterated this herself today.  Should continue to avoid these.  She has no progressive pain just bruising that is no longer worsening-if she has new or worsening symptoms she should follow-up to see [place].  #Diffuse body pain S: Patient feels like her diffuse pain has been going on for at least a year. Had a fall January 2020- we did knee x-rays and hip x-rays. We also updated x-rays of lumbar spine august 2020. Trial of prednisone was not helpful at that time for bilateral leg pain. Reports gabapentin not helpful. She states since first fall has started hurting all over - states there is not an area that she doesn't hurt.   Patient states that her entire body hurts all the time-she is unable to explain any areas that are particularly worse other than left leg which feels like it is on fire and makes walking difficult at times.  And denies any areas not being affected. She does not have any medication to help with pain. She had recent diagnosis  for gout and started taking medications with no improvement-it is difficult to pinpoint any area specifically bothered by gout due to broad ranging pain.  Patient did have ESR and CRP recently with Dr. Tamala Julian which were not elevated pointing away from polymyalgia rheumatica and prior did not have profound response to prednisone.  Prior had been tried on tramadol without significant relief either.  She previously saw Dr. Paulla Fore in March 2020 for left hip pain-reviewing that note he got patient's hip pain was related to deconditioning after her fall.  Recommended formal physical therapy and trial of steroid Dosepak was given.  I do not see that she participated in physical therapy.  Later referred to Dr. Gardenia Phlegm of sports medicine in September who listed polyarthralgia has primary diagnosis-he was concerned  about patient preoccupation with pain medication.  He was also concerned about depression or dementia playing a role.  Dr. Tamala Julian had another visit with patient on September 21, 2019 and noted pain out of proportion to exam.  Once again he felt like dementia could be contributing to patient's symptoms.  He recommended icing/home exercise.  Extensive blood work was completed without any obvious rheumatological cause of her pain.  Gabapentin was recommended before bed as well as pennsaid for painful areas and 107-monthfollow-up.   She did not feel like she had the best fit with Dr. STamala Julianand would like to discontinue visits. She came in rather upset about prior work-up with providers she has seen and fact she is not on anything for pain-I tried to comfort her and explained how thorough and appropriate her  work up was by other providers.  She made some irregular statements about me writing in her husband's chart about her-which I have no records of She also made inconsistent comments that she is unable to walk yet coupled this with her "doing everything in the home" and doing the work of someone 35 years younger than her daily.  She also made comments about wanting a "happy pill" like "everyone else has" but then complained that I have her own too many medicines.-Patient states just needs something to make her smile again/hurt less.   A/P: I am uncertain what is causing patient to have/ such diffuse pain.  There is not a specific discrete area of pain to work-up.  She does have depressed mood as well as anhedonia and thoughts that she may be "better off dead" but is able to specifically state that she has no thoughts of harming herself.  I am concerned she may have depression which is lowering her pain threshold.  I think a trial of Cymbalta would be reasonable-this was prescribed as below. -I also recommended a counselor -ESR and CRP not previously elevated so doubt PMR -Tramadol not helpful in the  past -Gabapentin at night not particularly helpful-could consider weaning this down.  She reports gabapentin being helpful in her past but she is not sure what she took it for -Patient wonders if letrozole could be causing some of her symptoms-myalgias listed as 7-9% chance on up-to-date -Trial of prednisone not helpful in the past -Counseling as per after visit summary -Reviewed all labs from September 21, 2019-largely reassuring other thanPatient did have some anemia on prior labs and we will repeat labs today.  She also has chronic kidney disease stage III and does not appear well-hydrated-encouraged increased hydration. -Denies use of alcohol or drugs or chronic pain medication  #Memory concern/changes S: Patient states she has been having some issues with names and directions more than normal.  She is not sure how long this has been going on MMSE - [place] Exam 12/03/2019  Orientation to time 4  Orientation to Place 4  Registration 3  Attention/ Calculation 5  Recall 0  Language- name 2 objects 2  Language- repeat 1  Language- follow 3 step command 3  Language- read & follow direction 1  Write a sentence 1  Copy design 1  Total score 25  A/P: Patient with reduced MMSE at 25 out of 30.  Patient really does not seem to be herself since pain issues have started.  She does have depressed mood which could worsen memory but I would still like to get neurology's opinion-I wonder about dementia  Recommended follow up: 1 month follow-up or sooner if needed Future Appointments  Date Time Provider [place]  12/15/2019 11:20 AM [name], [name], MD [facility]  12/24/2019  5:00 PM [place]  01/17/2020 11:20 AM [name], MD [facility]  04/05/2020  8:45 AM [name], MD [facility] None    Lab/Order associations:   ICD-10-CM   1. Diffuse pain  R52   2. Memory changes  R41.3 Vitamin B12    Ambulatory referral to  Neurology  3. Hyperlipidemia, unspecified hyperlipidemia type  E78.5 CBC with Differential/Platelet    Comprehensive metabolic panel   Meds ordered this encounter  Medications  . DULoxetine (CYMBALTA) 30 MG capsule    Sig: Take one 30 mg tablet by mouth once a day for the first week. Then increase to two  30 mg tablets ( total 64m) by mouth once daily.    Dispense:  60 capsule    Refill:  3   This note is not being shared with the patient for the following reason: To prevent harm (release of this note would result in harm to the life or physical safety of the patient or another). I am concerned if patient read my concerns about her behavior that she would be less trusting of medical system- I am concerned about her cognition and significant departure from her baseline over the last year.   57 minutes of total time (10:35 AM- 11:13 AM, 3:42- 3:48 PM, 4:50- 5:03 PM) was spent on the date of the encounter performing the following actions: chart review prior to seeing the patient, obtaining history, performing a medically necessary exam, counseling on the treatment plan, placing orders, and documenting in our EHR.   Return precautions advised.  SGarret Reddish MD

## 2019-12-03 NOTE — Patient Instructions (Addendum)
Recommended follow up: Return in about 1 month (around 12/31/2019).  Start cymbalta- 1 pill a day for 1 week then increase to 2 pills- if feels like too much we can stay with 1 pill  Please call [phone] to schedule a visit with [facility] -Trey Paula is an excellent counselor who is based out of our clinic  Taking the medicine as directed and not missing any doses is one of the best things you can do to treat your depression as well as chronic pain.  Here are some things to keep in mind:  1) Side effects (stomach upset, some increased anxiety) may happen before you notice a benefit.  These side effects typically go away over time. 2) Changes to your dose of medicine or a change in medication all together is sometimes necessary 3) Most people need to be on medication at least 6-12 months 4) Many people will notice an improvement within two weeks but the full effect of the medication can take up to 4-6 weeks 5) Stopping the medication when you start feeling better often results in a return of symptoms 6) If you start having thoughts of hurting yourself or others after starting this medicine, call our office immediately at [phone] or seek care through 911.    Please stop by lab before you go If you do not have mychart- we will call you about results within 5 business days of [place] receiving them.  If you have mychart- we will send your results within 3 business days of [place] receiving them.  If abnormal or we want to clarify a result, we will call or mychart you to make sure you receive the message.  If you have questions or concerns or don't hear within 5-7 days, please send [place] a message or call us.

## 2019-12-15 ENCOUNTER — Encounter: Payer: Self-pay | Admitting: Cardiovascular Disease

## 2019-12-15 ENCOUNTER — Ambulatory Visit: Payer: Medicare Other | Admitting: Cardiovascular Disease

## 2019-12-15 ENCOUNTER — Other Ambulatory Visit: Payer: Self-pay

## 2019-12-15 VITALS — BP 136/62 | HR 62 | Ht 64.0 in | Wt 166.8 lb

## 2019-12-15 DIAGNOSIS — I480 Paroxysmal atrial fibrillation: Secondary | ICD-10-CM

## 2019-12-15 MED ORDER — OMEPRAZOLE 40 MG PO CPDR: 40.0000 mg | DELAYED_RELEASE_CAPSULE | Freq: Every day | ORAL | Status: DC

## 2019-12-15 NOTE — Progress Notes (Signed)
Cardiology Office Note:    Date:  12/15/2019   ID:  Bridget Lane, DOB Dec 23, 1932, MRN [id number]  PCP:  [name], MD  Cardiologist:  Mertie Moores, MD  Electrophysiologist:  None   Referring MD: [name], MD   No chief complaint on file.    Aug. 7, 2020    Bridget Lane is a 86 y.o. female with a hx atrial fib.  She is a previous patient of Dr. end and is  transferring care.  She was last seen by Richardson Dopp, PA in January, 2020.  He noted that she had an a history of atrial ablation managed with flecainide and Eliquis.  She has a history of hyperlipidemia and breast cancer.  She was started on furosemide with improvement of her breathing.  Was exercising until she fell 3 months ago  Still has pain in her knee.   xrays do not show significant damange   December 15, 2019:  Bridget Lane is seen today for follow-up of her paroxysmal atrial fibrillation.  She has a history of atrial fibrillation ablation and has been on flecainide and Eliquis.  She has a history of hyperlipidemia and also history of breast cancer. She fell about 8 months ago .   Still recovering from the fall. Is on gabapendin,  synbolta  Has had some palpitations related to leg pain   Past Medical History:  Diagnosis Date  . Atrial fibrillation ([place])    persistent  . Colon polyp 2005  . Depression   . DJD (degenerative joint disease)   . Dysrhythmia   . Esophageal stricture   . Family history of breast cancer   . Family history of esophageal cancer   . Family history of prostate cancer   . GERD (gastroesophageal reflux disease)   . H. pylori infection   . Hiatal hernia   . Hyperlipidemia   . Macular degeneration   . Nontoxic multinodular goiter   . OA (osteoarthritis)   . Postherpetic neuralgia at T3-T5 level 04/27/2011  . Rectal bleeding 07/06/2014   Hemorrhoid related in past.      Past Surgical History:  Procedure Laterality Date  . ANTERIOR CERVICAL DECOMP/DISCECTOMY  FUSION N/A 05/29/2015   Procedure: ANTERIOR CERVICAL DECOMPRESSION/DISCECTOMY FUSION CERVICAL FOUR-FIVE,CERVICAL FIVE-SIX;  Surgeon: Kary Kos, MD;  Location: [facility];  Service: Neurosurgery;  Laterality: N/A;  . APPENDECTOMY    . BREAST LUMPECTOMY Left 06/2018  . BREAST LUMPECTOMY WITH RADIOACTIVE SEED LOCALIZATION Left 06/25/2018   Procedure: BREAST LUMPECTOMY WITH RADIOACTIVE SEED LOCALIZATION;  Surgeon: Erroll Luna, MD;  Location: [facility];  Service: General;  Laterality: Left;  . CARDIAC CATHETERIZATION    . CARDIOVERSION  08/29/2011   Procedure: CARDIOVERSION;  Surgeon: Bing Quarry, MD;  Location: [facility];  Service: Cardiovascular;  Laterality: N/A;  . CARDIOVERSION  11/22/2011   Procedure: CARDIOVERSION;  Surgeon: Coralyn Mark, MD;  Location: [facility];  Service: Cardiovascular;  Laterality: N/A;  . CHOLECYSTECTOMY  1989  . COLONOSCOPY W/ POLYPECTOMY  2005   Neg in 2010; Dr Olevia Perches  . ESOPHAGEAL DILATION     > 3 X; Dr Olevia Perches  . LEFT AND RIGHT HEART CATHETERIZATION WITH CORONARY ANGIOGRAM N/A 12/02/2011   Procedure: LEFT AND RIGHT HEART CATHETERIZATION WITH CORONARY ANGIOGRAM;  Surgeon: Jolaine Artist, MD;  Location: [facility];  Service: Cardiovascular;  Laterality: N/A;  . TOTAL ABDOMINAL HYSTERECTOMY  1972   for pain (no BSO)  . TUBAL LIGATION  with appendectomy  . UPPER GI ENDOSCOPY  2010   H pylori    Current Medications: Current Meds  Medication Sig  . apixaban (ELIQUIS) 5 MG TABS tablet Take 1 tablet (5 mg total) by mouth 2 (two) times daily.  . DULoxetine (CYMBALTA) 30 MG capsule Take one 30 mg tablet by mouth once a day for the first week. Then increase to two 30 mg tablets ( total 60mg ) by mouth once daily.  . flecainide (TAMBOCOR) 100 MG tablet TAKE 1 TABLET(100 MG) BY MOUTH TWICE DAILY  . furosemide (LASIX) 20 MG tablet Take 1 tablet (20 mg total) by mouth daily.  Marland Kitchen gabapentin (NEURONTIN) 100 MG capsule Take 2 capsules (200 mg total) by mouth  at bedtime.  Marland Kitchen letrozole (FEMARA) 2.5 MG tablet TAKE 1 TABLET(2.5 MG) BY MOUTH DAILY  . metoprolol tartrate (LOPRESSOR) 25 MG tablet Take 0.5 tablets (12.5 mg total) by mouth 2 (two) times daily.  . Multiple Vitamins-Minerals (PRESERVISION AREDS 2+MULTI VIT PO) Take by mouth daily.  Marland Kitchen omeprazole (PRILOSEC) 40 MG capsule Take 1 capsule (40 mg total) by mouth daily.  . [DISCONTINUED] omeprazole (PRILOSEC) 40 MG capsule TAKE 1 CAPSULE(40 MG) BY MOUTH TWICE DAILY (Patient taking differently: Take by mouth daily. )     Allergies:   Penicillins, Codeine, Acetaminophen, Dabigatran etexilate mesylate, Dabigatran etexilate mesylate, Sulfa antibiotics, and Sulfonamide derivatives   Social History   Socioeconomic History  . Marital status: Married    Spouse name: Not on file  . Number of children: 4  . Years of education: Not on file  . Highest education level: Not on file  Occupational History  . Occupation: retired  Tobacco Use  . Smoking status: Never Smoker  . Smokeless tobacco: Never Used  Substance and Sexual Activity  . Alcohol use: No  . Drug use: No  . Sexual activity: Not on file  Other Topics Concern  . Not on file  Social History Narrative   Married 1956. 4 children 2 boys 2 girls. 16 grandkids.  5 greatgrandkids.    Pt lives in [place] with spouse.        Retired from [employer]      Hobbies: travel, spend time with people, family time   Exercise-walking      No HCPOA-advised to do this.    Social Determinants of Health   Financial Resource Strain:   . Difficulty of Paying Living Expenses: Not on file  Food Insecurity:   . Worried About Charity fundraiser in the Last Year: Not on file  . Ran Out of Food in the Last Year: Not on file  Transportation Needs:   . Lack of Transportation (Medical): Not on file  . Lack of Transportation (Non-Medical): Not on file  Physical Activity:   . Days of Exercise per Week: Not on file  . Minutes of  Exercise per Session: Not on file  Stress:   . Feeling of Stress : Not on file  Social Connections:   . Frequency of Communication with Friends and Family: Not on file  . Frequency of Social Gatherings with Friends and Family: Not on file  . Attends Religious Services: Not on file  . Active Member of Clubs or Organizations: Not on file  . Attends Archivist Meetings: Not on file  . Marital Status: Not on file     Family History: The patient's family history includes Breast cancer in her maternal aunt and sister; Coronary artery disease in her  father and mother; Diabetes in her mother and sister; Esophageal cancer in her brother; Heart failure in her mother; Osteoarthritis in her father; Prostate cancer in her father.  ROS:   Please see the history of present illness.     All other systems reviewed and are negative.  EKGs/Labs/Other Studies Reviewed:    The following studies were reviewed today:  Physical Exam: Blood pressure 136/62, pulse 62, height 5\' 4"  (1.626 m), weight 166 lb 12 oz (75.6 kg), SpO2 99 %.  GEN:  Elderly female,   NAD  HEENT: Normal NECK: No JVD; No carotid bruits LYMPHATICS: No lymphadenopathy CARDIAC: RRR , soft systolic murmur  RESPIRATORY:  Clear to auscultation without rales, wheezing or rhonchi  ABDOMEN: Soft, non-tender, non-distended MUSCULOSKELETAL:  No edema; No deformity  SKIN: Warm and dry NEUROLOGIC:  Alert and oriented x 3   EKG:     Recent Labs: 09/21/2019: TSH 0.92 12/03/2019: ALT 25; BUN 36; Creatinine, Ser 1.35; Hemoglobin 10.8; Platelets 315.0; Potassium 4.4; Sodium 140  Recent Lipid Panel    Component Value Date/Time   CHOL 214 (H) 11/01/2016 0955   TRIG 164.0 (H) 11/01/2016 0955   HDL 53.50 11/01/2016 0955   CHOLHDL 4 11/01/2016 0955   VLDL 32.8 11/01/2016 0955   LDLCALC 127 (H) 11/01/2016 0955    Physical Exam:    Physical Exam: Blood pressure 136/62, pulse 62, height 5\' 4"  (1.626 m), weight 166 lb 12 oz (75.6  kg), SpO2 99 %.  GEN: Elderly female, no acute distress HEENT: Normal NECK: No JVD; No carotid bruits LYMPHATICS: No lymphadenopathy CARDIAC: RRR , no murmurs, rubs, gallops RESPIRATORY:  Clear to auscultation without rales, wheezing or rhonchi  ABDOMEN: Soft, non-tender, non-distended MUSCULOSKELETAL:  No edema; No deformity  SKIN: Warm and dry NEUROLOGIC:  Alert and oriented x 3   ASSESSMENT:    No diagnosis found. PLAN:    In order of problems listed above:  Paroxysmal atrial fib :   Is maintaining NSR  Cont eliquis  She overall seems to be doing very well.  Continue current medications.  Medication Adjustments/Labs and Tests Ordered: Current medicines are reviewed at length with the patient today.  Concerns regarding medicines are outlined above.  No orders of the defined types were placed in this encounter.  Meds ordered this encounter  Medications  . omeprazole (PRILOSEC) 40 MG capsule    Sig: Take 1 capsule (40 mg total) by mouth daily.    Patient Instructions  Your physician recommends that you continue on your current medications as directed. Please refer to the Current Medication list given to you today.    Your physician wants you to follow-up in: [place] will receive a reminder letter in the mail two months in advance. If you don't receive a letter, please call our office to schedule the follow-up appointment.     Signed, Mertie Moores, MD  12/15/2019 5:53 PM    [place]

## 2019-12-15 NOTE — Patient Instructions (Signed)
Your physician recommends that you continue on your current medications as directed. Please refer to the Current Medication list given to you today.    Your physician wants you to follow-up in: [place] will receive a reminder letter in the mail two months in advance. If you don't receive a letter, please call our office to schedule the follow-up appointment.

## 2019-12-20 ENCOUNTER — Ambulatory Visit: Payer: Medicare Other | Admitting: Family Medicine

## 2019-12-24 ENCOUNTER — Ambulatory Visit: Payer: Medicare Other | Attending: Internal Medicine

## 2019-12-24 ENCOUNTER — Other Ambulatory Visit: Payer: Self-pay

## 2019-12-24 DIAGNOSIS — Z23 Encounter for immunization: Secondary | ICD-10-CM

## 2019-12-24 NOTE — Progress Notes (Signed)
   Covid-19 Vaccination Clinic  Name:  Bridget Lane    MRN: [id number] DOB: 05-28-1933  12/24/2019  [name] was observed post Covid-19 immunization for 15 minutes without incident. She was provided with Vaccine Information Sheet and instruction to access the V-Safe system.   [name] was instructed to call 911 with any severe reactions post vaccine: Marland Kitchen Difficulty breathing  . Swelling of face and throat  . A fast heartbeat  . A bad rash all over body  . Dizziness and weakness   Immunizations Administered    Name Date Dose VIS Date Route   Pfizer COVID-19 Vaccine 12/24/2019  4:46 PM 0.3 mL 10/01/2019 Intramuscular   Manufacturer: [place]   Lot: [id number]   [place]: [id number]

## 2019-12-30 DIAGNOSIS — H353132 Nonexudative age-related macular degeneration, bilateral, intermediate dry stage: Secondary | ICD-10-CM | POA: Diagnosis not present

## 2020-01-07 DIAGNOSIS — S81811A Laceration without foreign body, right lower leg, initial encounter: Secondary | ICD-10-CM | POA: Diagnosis not present

## 2020-01-14 DIAGNOSIS — L089 Local infection of the skin and subcutaneous tissue, unspecified: Secondary | ICD-10-CM | POA: Diagnosis not present

## 2020-01-14 DIAGNOSIS — S81811D Laceration without foreign body, right lower leg, subsequent encounter: Secondary | ICD-10-CM | POA: Diagnosis not present

## 2020-01-17 ENCOUNTER — Ambulatory Visit: Payer: Medicare Other | Admitting: Family Medicine

## 2020-01-18 DIAGNOSIS — Z4802 Encounter for removal of sutures: Secondary | ICD-10-CM | POA: Diagnosis not present

## 2020-01-25 ENCOUNTER — Other Ambulatory Visit: Payer: Self-pay

## 2020-01-25 ENCOUNTER — Encounter (HOSPITAL_BASED_OUTPATIENT_CLINIC_OR_DEPARTMENT_OTHER): Payer: Self-pay

## 2020-01-25 ENCOUNTER — Emergency Department (HOSPITAL_BASED_OUTPATIENT_CLINIC_OR_DEPARTMENT_OTHER)
Admission: EM | Admit: 2020-01-25 | Discharge: 2020-01-25 | Disposition: A | Discharge status: Home / Self Care | Payer: Medicare Other | Attending: Emergency Medicine | Admitting: Emergency Medicine

## 2020-01-25 ENCOUNTER — Telehealth: Payer: Self-pay | Admitting: Family Medicine

## 2020-01-25 DIAGNOSIS — Z882 Allergy status to sulfonamides status: Secondary | ICD-10-CM | POA: Diagnosis not present

## 2020-01-25 DIAGNOSIS — Z4801 Encounter for change or removal of surgical wound dressing: Secondary | ICD-10-CM | POA: Diagnosis not present

## 2020-01-25 DIAGNOSIS — E785 Hyperlipidemia, unspecified: Secondary | ICD-10-CM | POA: Diagnosis not present

## 2020-01-25 DIAGNOSIS — L988 Other specified disorders of the skin and subcutaneous tissue: Secondary | ICD-10-CM | POA: Insufficient documentation

## 2020-01-25 DIAGNOSIS — Z880 Allergy status to penicillin: Secondary | ICD-10-CM | POA: Insufficient documentation

## 2020-01-25 DIAGNOSIS — Z888 Allergy status to other drugs, medicaments and biological substances status: Secondary | ICD-10-CM | POA: Diagnosis not present

## 2020-01-25 DIAGNOSIS — Z5189 Encounter for other specified aftercare: Secondary | ICD-10-CM

## 2020-01-25 DIAGNOSIS — Z885 Allergy status to narcotic agent status: Secondary | ICD-10-CM | POA: Diagnosis not present

## 2020-01-25 DIAGNOSIS — Z79899 Other long term (current) drug therapy: Secondary | ICD-10-CM | POA: Diagnosis not present

## 2020-01-25 DIAGNOSIS — Z9861 Coronary angioplasty status: Secondary | ICD-10-CM | POA: Diagnosis not present

## 2020-01-25 DIAGNOSIS — I4891 Unspecified atrial fibrillation: Secondary | ICD-10-CM | POA: Diagnosis not present

## 2020-01-25 DIAGNOSIS — S8991XD Unspecified injury of right lower leg, subsequent encounter: Secondary | ICD-10-CM | POA: Diagnosis not present

## 2020-01-25 DIAGNOSIS — Z7901 Long term (current) use of anticoagulants: Secondary | ICD-10-CM | POA: Insufficient documentation

## 2020-01-25 NOTE — ED Provider Notes (Signed)
[facility] EMERGENCY DEPARTMENT Provider Note   CSN: [id number] Arrival date & time: 01/25/20  1505     History Chief Complaint  Patient presents with  . Leg Injury    Bridget Lane is a 86 y.o. female.  HPI Patient presents to the emergency department with a need for wound check.  The patient states she had a laceration and skin tear to her lower leg while she was seeing her daughter out in [place].  She states she went to the emergency department there where they sutured her lower leg.  She states 2 to 3 days later she developed some skin infection the patient was then placed on antibiotics.  The patient states that she finished antibiotics last night.  She states that she was concerned due to the fact that the skin.  Darkened.  She states that they placed Steri-Strips on the wound after she had the sutures removed.  Patient states she has had no others symptoms associate with this.  She states there is still some mild redness but significantly better from previous.    Past Medical History:  Diagnosis Date  . Atrial fibrillation ([place])    persistent  . Colon polyp 2005  . Depression   . DJD (degenerative joint disease)   . Dysrhythmia   . Esophageal stricture   . Family history of breast cancer   . Family history of esophageal cancer   . Family history of prostate cancer   . GERD (gastroesophageal reflux disease)   . H. pylori infection   . Hiatal hernia   . Hyperlipidemia   . Macular degeneration   . Nontoxic multinodular goiter   . OA (osteoarthritis)   . Postherpetic neuralgia at T3-T5 level 04/27/2011  . Rectal bleeding 07/06/2014   Hemorrhoid related in past.      Patient Active Problem List   Diagnosis Date Noted  . Acquired thrombophilia ([place]) 09/01/2019  . Polyarthralgia 07/15/2019  . Osteoporosis 10/30/2018  . Genetic testing 07/02/2018  . 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, estrogen receptor positive ([place])  06/03/2018  . Dyspnea on exertion 03/23/2018  . Constipation 08/29/2017  . Cervical lymphadenopathy 05/16/2016  . Insomnia 08/25/2015  . Spinal stenosis in cervical region 05/29/2015  . History of Helicobacter pylori infection 07/06/2014  . Hyperglycemia 06/07/2014  . OSA (obstructive sleep apnea) 02/11/2012  . Long term (current) use of anticoagulants 07/19/2011  . Atrial fibrillation 06/14/2011  . Chest pain 06/14/2011  . Depression 01/30/2010  . History of colonic polyps 01/30/2010  . ESOPHAGEAL STRICTURE 07/20/2008  . Osteoarthritis 07/20/2008  . Multiple thyroid nodules 01/28/2008  . Hyperlipidemia 01/28/2008  . GERD 01/28/2008    Past Surgical History:  Procedure Laterality Date  . ANTERIOR CERVICAL DECOMP/DISCECTOMY FUSION N/A 05/29/2015   Procedure: ANTERIOR CERVICAL DECOMPRESSION/DISCECTOMY FUSION CERVICAL FOUR-FIVE,CERVICAL FIVE-SIX;  Surgeon: Kary Kos, MD;  Location: [facility];  Service: Neurosurgery;  Laterality: N/A;  . APPENDECTOMY    . BREAST LUMPECTOMY Left 06/2018  . BREAST LUMPECTOMY WITH RADIOACTIVE SEED LOCALIZATION Left 06/25/2018   Procedure: BREAST LUMPECTOMY WITH RADIOACTIVE SEED LOCALIZATION;  Surgeon: Erroll Luna, MD;  Location: [facility];  Service: General;  Laterality: Left;  . CARDIAC CATHETERIZATION    . CARDIOVERSION  08/29/2011   Procedure: CARDIOVERSION;  Surgeon: Bing Quarry, MD;  Location: [facility];  Service: Cardiovascular;  Laterality: N/A;  . CARDIOVERSION  11/22/2011   Procedure: CARDIOVERSION;  Surgeon: Coralyn Mark, MD;  Location: [facility];  Service: Cardiovascular;  Laterality: N/A;  . CHOLECYSTECTOMY  1989  . COLONOSCOPY W/ POLYPECTOMY  2005   Neg in 2010; Dr Olevia Perches  . ESOPHAGEAL DILATION     > 3 X; Dr Olevia Perches  . LEFT AND RIGHT HEART CATHETERIZATION WITH CORONARY ANGIOGRAM N/A 12/02/2011   Procedure: LEFT AND RIGHT HEART CATHETERIZATION WITH CORONARY ANGIOGRAM;  Surgeon: Jolaine Artist, MD;  Location: [facility] CATH LAB;   Service: Cardiovascular;  Laterality: N/A;  . TOTAL ABDOMINAL HYSTERECTOMY  1972   for pain (no BSO)  . TUBAL LIGATION     with appendectomy  . UPPER GI ENDOSCOPY  2010   H pylori     OB History   No obstetric history on file.     Family History  Problem Relation Age of Onset  . Heart failure Mother   . Coronary artery disease Mother   . Diabetes Mother   . Osteoarthritis Father   . Coronary artery disease Father   . Prostate cancer Father        in 26s  . Breast cancer Sister        in 81's  . Diabetes Sister   . Esophageal cancer Brother        smoked  . Breast cancer Maternal Aunt     Social History   Tobacco Use  . Smoking status: Never Smoker  . Smokeless tobacco: Never Used  Substance Use Topics  . Alcohol use: No  . Drug use: No    Home Medications Prior to Admission medications   Medication Sig Start Date End Date Taking? Authorizing Provider  apixaban (ELIQUIS) 5 MG TABS tablet Take 1 tablet (5 mg total) by mouth 2 (two) times daily. 09/20/19   End, Harrell Gave, MD  DULoxetine (CYMBALTA) 30 MG capsule Take one 30 mg tablet by mouth once a day for the first week. Then increase to two 30 mg tablets ( total 60mg ) by mouth once daily. 12/03/19   [name], MD  flecainide (TAMBOCOR) 100 MG tablet TAKE 1 TABLET(100 MG) BY MOUTH TWICE DAILY 09/20/19   Richardson Dopp T, PA-C  furosemide (LASIX) 20 MG tablet Take 1 tablet (20 mg total) by mouth daily. 11/08/19   [name], [name], MD  gabapentin (NEURONTIN) 100 MG capsule Take 2 capsules (200 mg total) by mouth at bedtime. 09/21/19   Lyndal Pulley, DO  letrozole [facility]) 2.5 MG tablet TAKE 1 TABLET(2.5 MG) BY MOUTH DAILY 04/06/19   [name], MD  metoprolol tartrate (LOPRESSOR) 25 MG tablet Take 0.5 tablets (12.5 mg total) by mouth 2 (two) times daily. 09/23/19   [name], [name], MD  Multiple Vitamins-Minerals (PRESERVISION AREDS 2+MULTI VIT PO) Take by mouth daily.    [provider]  omeprazole  (PRILOSEC) 40 MG capsule Take 1 capsule (40 mg total) by mouth daily. 12/15/19   [name], [name], MD    Allergies    Penicillins, Codeine, Acetaminophen, Dabigatran etexilate mesylate, Dabigatran etexilate mesylate, Sulfa antibiotics, and Sulfonamide derivatives  Review of Systems   Review of Systems All other systems negative except as documented in the HPI. All pertinent positives and negatives as reviewed in the HPI. Physical Exam Updated Vital Signs BP (!) 147/71 (BP Location: Left Arm)   Pulse 76   Temp 99 F (37.2 C) (Oral)   Resp 18   Ht 5\' 4"  (1.626 m)   Wt 66.2 kg   SpO2 99%   BMI 25.06 kg/m   Physical Exam Vitals and nursing note  reviewed.  Constitutional:      General: She is not in acute distress.    Appearance: She is well-developed.  HENT:     Head: Normocephalic and atraumatic.  Eyes:     Pupils: Pupils are equal, round, and reactive to light.  Pulmonary:     Effort: Pulmonary effort is normal.  Skin:    General: Skin is warm and dry.       Neurological:     Mental Status: She is alert and oriented to person, place, and time.     ED Results / Procedures / Treatments   Labs (all labs ordered are listed, but only abnormal results are displayed) Labs Reviewed - No data to display  EKG None  Radiology No results found.  Procedures Procedures (including critical care time)  Medications Ordered in ED Medications - No data to display  ED Course  I have reviewed the triage vital signs and the nursing notes.  Pertinent labs & imaging results that were available during my care of the patient were reviewed by me and considered in my medical decision making (see chart for details).    MDM Rules/Calculators/A&P                      At this point I do not feel that the patient has a skin infection.  She does have healing tissue noted but this will take quite a while to heal based on the fact that there was a skin tear and that skin tissue has died.   The patient is advised of this and advised to follow-up with her primary doctor.  Patient agrees the plan and all questions were answered. Final Clinical Impression(s) / ED Diagnoses Final diagnoses:  None    Rx / DC Orders ED Discharge Orders    None       Dalia Heading, PA-C 01/25/20 [place], Wonda Olds, MD 01/27/20 [phone]

## 2020-01-25 NOTE — ED Triage Notes (Signed)
Pt states that she hit her leg on the dishwasher door at her daughters on March 18th, reports she was placed on antibiotics and took all of them, has concern that the area "looks bad" and is not healing.

## 2020-01-25 NOTE — Discharge Instructions (Addendum)
Keep the area clean and dry.  Follow-up with your primary doctor.  There is any changing or worsening in your condition you can return here for reevaluation.

## 2020-01-25 NOTE — Telephone Encounter (Signed)
Nurse Assessment Nurse: Neena Rhymes, RN, Sharyn Lull Date/Time (Eastern Time): 01/25/2020 11:06:43 AM Confirm and document reason for call. If symptomatic, describe symptoms. ---Caller states that she fell over the dishwasher door 2 weeks ago. She got 11 stiches in her right leg right above her ankle. Her skin tore in a triangle pattern. She is on antibiotics, but not improving. Her leg is red and black. She got the stitches out 2-3 days ago, and it still hurts. Denies fever. Has the patient had close contact with a person known or suspected to have the novel coronavirus illness OR traveled / lives in area with major community spread (including international travel) in the last 14 days from the onset of symptoms? * If Asymptomatic, screen for exposure and travel within the last 14 days. ---No Does the patient have any new or worsening symptoms? ---Yes Will a triage be completed? ---Yes Related visit to physician within the last 2 weeks? ---Yes Does the PT have any chronic conditions? (i.e. diabetes, asthma, this includes High risk factors for pregnancy, etc.) ---Yes List chronic conditions. ---a fib on a blood thinner. Is this a behavioral health or substance abuse call? ---NoPLEASE NOTE: All timestamps contained within this report are represented as [place] Standard Time. CONFIDENTIALTY NOTICE: This fax transmission is intended only for the addressee. It contains information that is legally privileged, confidential or otherwise protected from use or disclosure. If you are not the intended recipient, you are strictly prohibited from reviewing, disclosing, copying using or disseminating any of this information or taking any action in reliance on or regarding this information. If you have received this fax in error, please notify us immediately by telephone so that we can arrange for its return to [place]. Phone: [phone], Toll-Free: [phone], Fax: [phone] Page: 2 of 2 Call Id:  [id number] Guidelines Guideline Title Affirmed Question Affirmed Notes Nurse Date/Time Eilene Ghazi Time) Wound Infection Black (necrotic) or blisters develop in wound [place], RN, Sharyn Lull 01/25/2020 11:09:34 AM Disp. Time Eilene Ghazi Time) Disposition Final User 01/25/2020 11:13:00 AM Go to ED Now (or PCP triage) Yes Neena Rhymes, RN, Julio Sicks Disagree/Comply Comply Caller Understands No PreDisposition Call Doctor Care Advice Given Per Guideline GO TO ED NOW (OR PCP TRIAGE): CARE ADVICE per Wound Infection (Adult) guideline.

## 2020-01-25 NOTE — Telephone Encounter (Signed)
Noted thanks °

## 2020-01-25 NOTE — Telephone Encounter (Signed)
FYI: pt at ED 

## 2020-01-26 ENCOUNTER — Ambulatory Visit: Payer: Medicare Other | Admitting: Family Medicine

## 2020-02-02 ENCOUNTER — Ambulatory Visit (INDEPENDENT_AMBULATORY_CARE_PROVIDER_SITE_OTHER): Payer: Medicare Other | Admitting: Family Medicine

## 2020-02-02 ENCOUNTER — Encounter: Payer: Self-pay | Admitting: Family Medicine

## 2020-02-02 ENCOUNTER — Other Ambulatory Visit: Payer: Self-pay

## 2020-02-02 VITALS — BP 132/60 | HR 95 | Temp 98.3°F | Ht 64.0 in | Wt 145.0 lb

## 2020-02-02 DIAGNOSIS — F3342 Major depressive disorder, recurrent, in full remission: Secondary | ICD-10-CM

## 2020-02-02 DIAGNOSIS — M159 Polyosteoarthritis, unspecified: Secondary | ICD-10-CM

## 2020-02-02 DIAGNOSIS — M8949 Other hypertrophic osteoarthropathy, multiple sites: Secondary | ICD-10-CM | POA: Diagnosis not present

## 2020-02-02 DIAGNOSIS — M4802 Spinal stenosis, cervical region: Secondary | ICD-10-CM | POA: Diagnosis not present

## 2020-02-02 DIAGNOSIS — R413 Other amnesia: Secondary | ICD-10-CM

## 2020-02-02 DIAGNOSIS — L03116 Cellulitis of left lower limb: Secondary | ICD-10-CM | POA: Diagnosis not present

## 2020-02-02 MED ORDER — DOXYCYCLINE HYCLATE 100 MG PO TABS: 100.0000 mg | ORAL_TABLET | Freq: Two times a day (BID) | ORAL | 0 refills | Status: AC

## 2020-02-02 NOTE — Patient Instructions (Addendum)
Looks like local skin infection- antibiotic should help- if not improving or symptoms worsen see [place] back.   Glad you are feeling better from pain and depression though I know you are not perfect  Consider counseling Please call [phone] to schedule a visit with [facility] -Trey Paula is an excellent counselor who is based out of our clinic  Recommended follow up: Return in about 4 months (around 06/03/2020) for physical or sooner if needed.

## 2020-02-02 NOTE — Progress Notes (Signed)
Phone [phone] In person visit   Subjective:   Bridget Lane is a 86 y.o. year old very pleasant female patient who presents for/with See problem oriented charting Chief Complaint  Patient presents with  . Laceration    This visit occurred during the SARS-CoV-2 public health emergency.  Safety protocols were in place, including screening questions prior to the visit, additional usage of staff PPE, and extensive cleaning of exam room while observing appropriate contact time as indicated for disinfecting solutions.   Past Medical History-  Patient Active Problem List   Diagnosis Date Noted  . 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, estrogen receptor positive ([place]) 06/03/2018    Priority: High  . Atrial fibrillation 06/14/2011    Priority: High  . Insomnia 08/25/2015    Priority: Medium  . Spinal stenosis in cervical region 05/29/2015    Priority: Medium  . Hyperglycemia 06/07/2014    Priority: Medium  . Hyperlipidemia 01/28/2008    Priority: Medium  . Cervical lymphadenopathy 05/16/2016    Priority: Low  . History of Helicobacter pylori infection 07/06/2014    Priority: Low  . OSA (obstructive sleep apnea) 02/11/2012    Priority: Low  . Long term (current) use of anticoagulants 07/19/2011    Priority: Low  . Chest pain 06/14/2011    Priority: Low  . Depression 01/30/2010    Priority: Low  . History of colonic polyps 01/30/2010    Priority: Low  . ESOPHAGEAL STRICTURE 07/20/2008    Priority: Low  . Osteoarthritis 07/20/2008    Priority: Low  . Multiple thyroid nodules 01/28/2008    Priority: Low  . GERD 01/28/2008    Priority: Low  . Acquired thrombophilia ([place]) 09/01/2019  . Polyarthralgia 07/15/2019  . Osteoporosis 10/30/2018  . Genetic testing 07/02/2018  . Dyspnea on exertion 03/23/2018  . Constipation 08/29/2017    Medications- reviewed and updated Current Outpatient Medications  Medication Sig Dispense Refill  .  apixaban (ELIQUIS) 5 MG TABS tablet Take 1 tablet (5 mg total) by mouth 2 (two) times daily. 180 tablet 1  . DULoxetine (CYMBALTA) 30 MG capsule Take one 30 mg tablet by mouth once a day for the first week. Then increase to two 30 mg tablets ( total 60mg ) by mouth once daily. 60 capsule 3  . flecainide (TAMBOCOR) 100 MG tablet TAKE 1 TABLET(100 MG) BY MOUTH TWICE DAILY 180 tablet 1  . furosemide (LASIX) 20 MG tablet Take 1 tablet (20 mg total) by mouth daily. 90 tablet 2  . gabapentin (NEURONTIN) 100 MG capsule Take 2 capsules (200 mg total) by mouth at bedtime. 180 capsule 3  . letrozole (FEMARA) 2.5 MG tablet TAKE 1 TABLET(2.5 MG) BY MOUTH DAILY 90 tablet 3  . metoprolol tartrate (LOPRESSOR) 25 MG tablet Take 0.5 tablets (12.5 mg total) by mouth 2 (two) times daily. 30 tablet 8  . Multiple Vitamins-Minerals (PRESERVISION AREDS 2+MULTI VIT PO) Take by mouth daily.    Marland Kitchen omeprazole (PRILOSEC) 40 MG capsule Take 1 capsule (40 mg total) by mouth daily.    Marland Kitchen doxycycline (VIBRA-TABS) 100 MG tablet Take 1 tablet (100 mg total) by mouth 2 (two) times daily for 7 days. 14 tablet 0   No current facility-administered medications for this visit.     Objective:  BP 132/60   Pulse 95   Temp 98.3 F (36.8 C) (Temporal)   Ht 5\' 4"  (1.626 m)   Wt 145 lb (65.8 kg)   SpO2 95%  BMI 24.89 kg/m  Gen: NAD, resting comfortably CV: RRR no murmurs rubs or gallops Lungs: CTAB no crackles, wheeze, rhonchi  Ext: no edema Skin: warm, dry, on left lower extremity has a healing scar surrounded by eyrthema (more intense than it photographed) that is warm to touch and slightly tender concerning for cellulitis       Assessment and Plan   # poorly healing wound after skin laceration now with redness S: originally had skin tear/laceration on right lower leg on 16th of march in [place] where dishwasher edge came down and hit her leg.  She went to emergency room and had 11 sutures- she stayed 2 more weeks in  [place] and seemed to improve then worsened and had to go on antibiotics short term and got worse. They removed sutures before she came home. She went to be with her daughter.   Ten Seen in local ED on 4/62021. She had just finished antibiotics when she saw her and they did not think she needed more. But since that time she has noted worsening redness around the lesion, warmth and tenderness.   She has penicillin allergy with rash and sulfa allergy. She is not 100% sure on name of antibiotic but thinks it may have been doxycycline when directly asked A/P: appears to have a cellulitis on her right lower leg where she had prior laceration that is slowly healing. She tends to take a long time to heal has had prior wound in this area. We will give her 7 days of doxycycline and if not improving or worsens she agrees to come back and see [place].  -Warned of photosensitivity risk  # Diffuse pain- osteoarthritis and spinal stenosis as likely triggers S:Cymbalta 30 mg twice a day has been helpful for chronic pain  A/P: Patient seems much more like herself today-previously very tearful and distraught about pain levels.  I am encouraged that the Cymbalta has helped her so much-she also thinks the gabapentin that Dr. Tamala Julian gave her has been helpful for bedtime.  # Depression S: Compliant with Cymbalta 30 mg twice a day Depression screen [facility] 2/9 02/02/2020 12/03/2019 07/06/2019  Decreased Interest 1 2 1   Down, Depressed, Hopeless 1 2 1   PHQ - 2 Score 2 4 2   Altered sleeping 0 2 2  Tired, decreased energy 1 2 1   Change in appetite 1 2 0  Feeling bad or failure about yourself  1 0 1  Trouble concentrating 2 2 2   Moving slowly or fidgety/restless 0 0 0  Suicidal thoughts 0 1 0  PHQ-9 Score 7 13 8   Difficult doing work/chores Somewhat difficult Very difficult Somewhat difficult  Some recent data might be hidden   A/P: Patient with improvement in scores-in regards to memory issues she plans to call to schedule  follow-up with neurology.  Her trouble concentrating score was primarily related to memory-without that PHQ-9 would be 5-I would consider this full remission -In the past did not tolerate SSRI but seems to be doing well on SNRI. -I did suggest possibly considering counseling-she will think this over  Recommended follow up: Return in about 4 months (around 06/03/2020) for physical or sooner if needed. Future Appointments  Date Time Provider [place]  04/05/2020  8:45 AM [name], MD [facility] None  06/12/2020  1:20 PM [name], MD [facility]    Lab/Order associations:   ICD-10-CM   1. Cellulitis of left lower extremity  L03.116   2. Recurrent major depressive disorder,  in full remission ([place])  F33.42   3. Spinal stenosis in cervical region  M48.02   4. Primary osteoarthritis involving multiple joints  M89.49   5. Memory changes  R41.3     Meds ordered this encounter  Medications  . doxycycline (VIBRA-TABS) 100 MG tablet    Sig: Take 1 tablet (100 mg total) by mouth 2 (two) times daily for 7 days.    Dispense:  14 tablet    Refill:  0   Return precautions advised.  Garret Reddish, MD

## 2020-02-02 NOTE — Assessment & Plan Note (Signed)
S: Compliant with Cymbalta 30 mg twice a day Depression screen [facility] 2/9 02/02/2020 12/03/2019 07/06/2019  Decreased Interest 1 2 1   Down, Depressed, Hopeless 1 2 1   PHQ - 2 Score 2 4 2   Altered sleeping 0 2 2  Tired, decreased energy 1 2 1   Change in appetite 1 2 0  Feeling bad or failure about yourself  1 0 1  Trouble concentrating 2 2 2   Moving slowly or fidgety/restless 0 0 0  Suicidal thoughts 0 1 0  PHQ-9 Score 7 13 8   Difficult doing work/chores Somewhat difficult Very difficult Somewhat difficult  Some recent data might be hidden   A/P: Patient with improvement in scores-in regards to memory issues she plans to call to schedule follow-up with neurology.  Her trouble concentrating score was primarily related to memory-without that PHQ-9 would be 5-I would consider this full remission -In the past did not tolerate SSRI but seems to be doing well on SNRI. -I did suggest possibly considering counseling-she will think this over

## 2020-02-03 DIAGNOSIS — H353132 Nonexudative age-related macular degeneration, bilateral, intermediate dry stage: Secondary | ICD-10-CM | POA: Diagnosis not present

## 2020-02-03 DIAGNOSIS — H43813 Vitreous degeneration, bilateral: Secondary | ICD-10-CM | POA: Diagnosis not present

## 2020-02-03 DIAGNOSIS — H43391 Other vitreous opacities, right eye: Secondary | ICD-10-CM | POA: Diagnosis not present

## 2020-02-28 ENCOUNTER — Other Ambulatory Visit: Payer: Self-pay

## 2020-02-28 ENCOUNTER — Ambulatory Visit (INDEPENDENT_AMBULATORY_CARE_PROVIDER_SITE_OTHER): Payer: Medicare Other | Admitting: Internal Medicine

## 2020-02-28 ENCOUNTER — Encounter: Payer: Self-pay | Admitting: Internal Medicine

## 2020-02-28 VITALS — BP 120/60 | HR 88 | Temp 98.1°F | Ht 64.0 in | Wt 165.2 lb

## 2020-02-28 DIAGNOSIS — Z9181 History of falling: Secondary | ICD-10-CM | POA: Diagnosis not present

## 2020-02-28 DIAGNOSIS — W19XXXA Unspecified fall, initial encounter: Secondary | ICD-10-CM | POA: Diagnosis not present

## 2020-02-28 DIAGNOSIS — S6991XA Unspecified injury of right wrist, hand and finger(s), initial encounter: Secondary | ICD-10-CM | POA: Diagnosis not present

## 2020-02-28 DIAGNOSIS — Z7901 Long term (current) use of anticoagulants: Secondary | ICD-10-CM

## 2020-02-28 DIAGNOSIS — S60221A Contusion of right hand, initial encounter: Secondary | ICD-10-CM | POA: Diagnosis not present

## 2020-02-28 NOTE — Progress Notes (Signed)
This visit occurred during the SARS-CoV-2 public health emergency.  Safety protocols were in place, including screening questions prior to the visit, additional usage of staff PPE, and extensive cleaning of exam room while observing appropriate contact time as indicated for disinfecting solutions.    Chief Complaint  Patient presents with  . Fall    had a bad fall yesterday  . Wrist Pain    right wrist  . Hand Pain    right hand    HPI: Bridget Lane 86 y.o. come in for [facility]  Fall on May 9 and PCP appt NA  Fell up step?  Right arm flexed and hyper flexed at wrist    No pop  Had to straighten out wrist   An get up  Ice  And came in today .  hx of recent falls  denies syncope loc  Thinks related to problem with left leg  Is on anticoagluation for ? AFib   Nursing a gash wound from last month and 2 round of antibiotics? Dose she need more  No fever  Lots of stress .   Still has large scab.  Drove her self today  Is right handed  Hurts fingers pinky and wrist forearm  ROS: See pertinent positives and negatives per HPI.  Past Medical History:  Diagnosis Date  . Atrial fibrillation ([place])    persistent  . Colon polyp 2005  . Depression   . DJD (degenerative joint disease)   . Dysrhythmia   . Esophageal stricture   . Family history of breast cancer   . Family history of esophageal cancer   . Family history of prostate cancer   . GERD (gastroesophageal reflux disease)   . H. pylori infection   . Hiatal hernia   . Hyperlipidemia   . Macular degeneration   . Nontoxic multinodular goiter   . OA (osteoarthritis)   . Postherpetic neuralgia at T3-T5 level 04/27/2011  . Rectal bleeding 07/06/2014   Hemorrhoid related in past.      Family History  Problem Relation Age of Onset  . Heart failure Mother   . Coronary artery disease Mother   . Diabetes Mother   . Osteoarthritis Father   . Coronary artery disease Father   . Prostate cancer Father        in 33s  . Breast cancer  Sister        in 34's  . Diabetes Sister   . Esophageal cancer Brother        smoked  . Breast cancer Maternal Aunt     Social History   Socioeconomic History  . Marital status: Married    Spouse name: Not on file  . Number of children: 4  . Years of education: Not on file  . Highest education level: Not on file  Occupational History  . Occupation: retired  Tobacco Use  . Smoking status: Never Smoker  . Smokeless tobacco: Never Used  Substance and Sexual Activity  . Alcohol use: No  . Drug use: No  . Sexual activity: Not on file  Other Topics Concern  . Not on file  Social History Narrative   Married 1956. 4 children 2 boys 2 girls. 16 grandkids.  5 greatgrandkids.    Pt lives in [place] with spouse.        Retired from [employer]      Hobbies: travel, spend time with people, family time   Exercise-walking      No HCPOA-advised  to do this.    Social Determinants of Health   Financial Resource Strain:   . Difficulty of Paying Living Expenses:   Food Insecurity:   . Worried About Charity fundraiser in the Last Year:   . Arboriculturist in the Last Year:   Transportation Needs:   . Film/video editor (Medical):   Marland Kitchen Lack of Transportation (Non-Medical):   Physical Activity:   . Days of Exercise per Week:   . Minutes of Exercise per Session:   Stress:   . Feeling of Stress :   Social Connections:   . Frequency of Communication with Friends and Family:   . Frequency of Social Gatherings with Friends and Family:   . Attends Religious Services:   . Active Member of Clubs or Organizations:   . Attends Archivist Meetings:   Marland Kitchen Marital Status:     Outpatient Medications Prior to Visit  Medication Sig Dispense Refill  . apixaban (ELIQUIS) 5 MG TABS tablet Take 1 tablet (5 mg total) by mouth 2 (two) times daily. 180 tablet 1  . DULoxetine (CYMBALTA) 30 MG capsule Take one 30 mg tablet by mouth once a day for the first week.  Then increase to two 30 mg tablets ( total 60mg ) by mouth once daily. 60 capsule 3  . flecainide (TAMBOCOR) 100 MG tablet TAKE 1 TABLET(100 MG) BY MOUTH TWICE DAILY 180 tablet 1  . furosemide (LASIX) 20 MG tablet Take 1 tablet (20 mg total) by mouth daily. 90 tablet 2  . gabapentin (NEURONTIN) 100 MG capsule Take 2 capsules (200 mg total) by mouth at bedtime. 180 capsule 3  . letrozole (FEMARA) 2.5 MG tablet TAKE 1 TABLET(2.5 MG) BY MOUTH DAILY 90 tablet 3  . metoprolol tartrate (LOPRESSOR) 25 MG tablet Take 0.5 tablets (12.5 mg total) by mouth 2 (two) times daily. 30 tablet 8  . Multiple Vitamins-Minerals (PRESERVISION AREDS 2+MULTI VIT PO) Take by mouth daily.    Marland Kitchen omeprazole (PRILOSEC) 40 MG capsule Take 1 capsule (40 mg total) by mouth daily.     No facility-administered medications prior to visit.     EXAM:  BP 120/60   Pulse 88   Temp 98.1 F (36.7 C) (Temporal)   Ht 5\' 4"  (1.626 m)   Wt 165 lb 3.2 oz (74.9 kg)   SpO2 97%   BMI 28.36 kg/m   Body mass index is 28.36 kg/m.  GENERAL: vitals reviewed and listed above, alert, oriented, appears well hydrated and in no acute distress HEENT: atraumatic, conjunctiva  clear, no obvious abnormalities on inspection of external nose and ears  NECK: no obvious masses on inspection palpation  CV: HRRR, no clubbing cyanosis or  peripheral edema nl cap refill  MS: moves all extremities   Right  Distal arm forearm wrist hand fingers with extensive ecchymosis   And swelling  But will move hand and wrist   Most tender 5 metacarpal and ring finger and mid wrist .    rle wound cnetral eschar about 2 cm with surrounding  Mild erythema no warmth or dc  PSYCH: pleasant and cooperative,   BP Readings from Last 3 Encounters:  02/28/20 120/60  02/02/20 132/60  01/25/20 132/75    ASSESSMENT AND PLAN:  Discussed the following assessment and plan:  Injury of right wrist, initial encounter - Plan: Ambulatory referral to Orthopedic  Surgery  Injury of right hand, initial encounter - Plan: Ambulatory referral to Orthopedic Surgery  History of  fall - Plan: Ambulatory referral to Orthopedic Surgery  Anticoagulant long-term use - Plan: Ambulatory referral to Orthopedic Surgery No x ray available  in building today large swelling and  Areas of pain   NV seems ok   prob fracture or sig st injury  Refer to ortho asap  Is right handed and drove  To office .  Need splinting  Wrapping today  Chronic  wound on leg  Uncertain if need more wound care help.   Will need help with fall prevention in future  Feels left leg is bothersome  and puts her at risk for fall.  -Patient advised to return or notify health care team  if  new concerns arise.  Patient Instructions  Fall prevention see ortho elevated in interim   Ortho appt or  Urgent ortho clinic sm etc     Talen Poser K. Hobert Poplaski M.D.

## 2020-02-28 NOTE — Patient Instructions (Signed)
Fall prevention see ortho elevated in interim   Ortho appt or  Urgent ortho clinic sm etc

## 2020-03-15 ENCOUNTER — Telehealth: Payer: Self-pay | Admitting: Hematology and Oncology

## 2020-03-15 NOTE — Telephone Encounter (Signed)
Rescheduled pt to 6/21. Provider on PAL. Unable to reach pt. Left voicemail.

## 2020-03-23 ENCOUNTER — Emergency Department (HOSPITAL_BASED_OUTPATIENT_CLINIC_OR_DEPARTMENT_OTHER)
Admission: EM | Admit: 2020-03-23 | Discharge: 2020-03-23 | Disposition: A | Discharge status: Home / Self Care | Payer: Medicare Other | Attending: Emergency Medicine | Admitting: Emergency Medicine

## 2020-03-23 ENCOUNTER — Encounter (HOSPITAL_BASED_OUTPATIENT_CLINIC_OR_DEPARTMENT_OTHER): Payer: Self-pay | Admitting: *Deleted

## 2020-03-23 ENCOUNTER — Emergency Department (HOSPITAL_BASED_OUTPATIENT_CLINIC_OR_DEPARTMENT_OTHER): Payer: Medicare Other

## 2020-03-23 ENCOUNTER — Other Ambulatory Visit: Payer: Self-pay

## 2020-03-23 ENCOUNTER — Telehealth: Payer: Self-pay | Admitting: Family Medicine

## 2020-03-23 DIAGNOSIS — Z7901 Long term (current) use of anticoagulants: Secondary | ICD-10-CM | POA: Diagnosis not present

## 2020-03-23 DIAGNOSIS — W19XXXA Unspecified fall, initial encounter: Secondary | ICD-10-CM | POA: Insufficient documentation

## 2020-03-23 DIAGNOSIS — Y998 Other external cause status: Secondary | ICD-10-CM | POA: Diagnosis not present

## 2020-03-23 DIAGNOSIS — Z79899 Other long term (current) drug therapy: Secondary | ICD-10-CM | POA: Diagnosis not present

## 2020-03-23 DIAGNOSIS — R519 Headache, unspecified: Secondary | ICD-10-CM | POA: Diagnosis not present

## 2020-03-23 DIAGNOSIS — Y929 Unspecified place or not applicable: Secondary | ICD-10-CM | POA: Diagnosis not present

## 2020-03-23 DIAGNOSIS — S0990XA Unspecified injury of head, initial encounter: Secondary | ICD-10-CM | POA: Insufficient documentation

## 2020-03-23 DIAGNOSIS — Y9389 Activity, other specified: Secondary | ICD-10-CM | POA: Diagnosis not present

## 2020-03-23 DIAGNOSIS — M549 Dorsalgia, unspecified: Secondary | ICD-10-CM | POA: Insufficient documentation

## 2020-03-23 NOTE — ED Provider Notes (Signed)
[facility] EMERGENCY DEPARTMENT Provider Note   CSN: [id number] Arrival date & time: 03/23/20  1227     History Chief Complaint  Patient presents with  . Fall    Bridget Lane is a 86 y.o. female.  The history is provided by the patient, medical records and the spouse.  Fall This is a recurrent problem. The current episode started 6 to 12 hours ago. The problem occurs rarely. The problem has been resolved. Associated symptoms include headaches. Pertinent negatives include no chest pain, no abdominal pain and no shortness of breath. Nothing aggravates the symptoms. Nothing relieves the symptoms. She has tried nothing for the symptoms. The treatment provided no relief.       Past Medical History:  Diagnosis Date  . Atrial fibrillation ([place])    persistent  . Colon polyp 2005  . Depression   . DJD (degenerative joint disease)   . Dysrhythmia   . Esophageal stricture   . Family history of breast cancer   . Family history of esophageal cancer   . Family history of prostate cancer   . GERD (gastroesophageal reflux disease)   . H. pylori infection   . Hiatal hernia   . Hyperlipidemia   . Macular degeneration   . Nontoxic multinodular goiter   . OA (osteoarthritis)   . Postherpetic neuralgia at T3-T5 level 04/27/2011  . Rectal bleeding 07/06/2014   Hemorrhoid related in past.      Patient Active Problem List   Diagnosis Date Noted  . Acquired thrombophilia ([place]) 09/01/2019  . Polyarthralgia 07/15/2019  . Osteoporosis 10/30/2018  . Genetic testing 07/02/2018  . 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, estrogen receptor positive ([place]) 06/03/2018  . Dyspnea on exertion 03/23/2018  . Constipation 08/29/2017  . Cervical lymphadenopathy 05/16/2016  . Insomnia 08/25/2015  . Spinal stenosis in cervical region 05/29/2015  . History of Helicobacter pylori infection 07/06/2014  . Hyperglycemia 06/07/2014  . OSA (obstructive sleep apnea)  02/11/2012  . Long term (current) use of anticoagulants 07/19/2011  . Atrial fibrillation 06/14/2011  . Chest pain 06/14/2011  . Depression 01/30/2010  . History of colonic polyps 01/30/2010  . ESOPHAGEAL STRICTURE 07/20/2008  . Osteoarthritis 07/20/2008  . Multiple thyroid nodules 01/28/2008  . Hyperlipidemia 01/28/2008  . GERD 01/28/2008    Past Surgical History:  Procedure Laterality Date  . ANTERIOR CERVICAL DECOMP/DISCECTOMY FUSION N/A 05/29/2015   Procedure: ANTERIOR CERVICAL DECOMPRESSION/DISCECTOMY FUSION CERVICAL FOUR-FIVE,CERVICAL FIVE-SIX;  Surgeon: Kary Kos, MD;  Location: [facility];  Service: Neurosurgery;  Laterality: N/A;  . APPENDECTOMY    . BREAST LUMPECTOMY Left 06/2018  . BREAST LUMPECTOMY WITH RADIOACTIVE SEED LOCALIZATION Left 06/25/2018   Procedure: BREAST LUMPECTOMY WITH RADIOACTIVE SEED LOCALIZATION;  Surgeon: Erroll Luna, MD;  Location: [facility];  Service: General;  Laterality: Left;  . CARDIAC CATHETERIZATION    . CARDIOVERSION  08/29/2011   Procedure: CARDIOVERSION;  Surgeon: Bing Quarry, MD;  Location: [facility];  Service: Cardiovascular;  Laterality: N/A;  . CARDIOVERSION  11/22/2011   Procedure: CARDIOVERSION;  Surgeon: Coralyn Mark, MD;  Location: [facility];  Service: Cardiovascular;  Laterality: N/A;  . CHOLECYSTECTOMY  1989  . COLONOSCOPY W/ POLYPECTOMY  2005   Neg in 2010; Dr Olevia Perches  . ESOPHAGEAL DILATION     > 3 X; Dr Olevia Perches  . LEFT AND RIGHT HEART CATHETERIZATION WITH CORONARY ANGIOGRAM N/A 12/02/2011   Procedure: LEFT AND RIGHT HEART CATHETERIZATION WITH CORONARY ANGIOGRAM;  Surgeon: Quillian Quince  R Bensimhon, MD;  Location: [facility];  Service: Cardiovascular;  Laterality: N/A;  . TOTAL ABDOMINAL HYSTERECTOMY  1972   for pain (no BSO)  . TUBAL LIGATION     with appendectomy  . UPPER GI ENDOSCOPY  2010   H pylori     OB History   No obstetric history on file.     Family History  Problem Relation Age of Onset  . Heart  failure Mother   . Coronary artery disease Mother   . Diabetes Mother   . Osteoarthritis Father   . Coronary artery disease Father   . Prostate cancer Father        in 74s  . Breast cancer Sister        in 35's  . Diabetes Sister   . Esophageal cancer Brother        smoked  . Breast cancer Maternal Aunt     Social History   Tobacco Use  . Smoking status: Never Smoker  . Smokeless tobacco: Never Used  Substance Use Topics  . Alcohol use: No  . Drug use: No    Home Medications Prior to Admission medications   Medication Sig Start Date End Date Taking? Authorizing Provider  apixaban (ELIQUIS) 5 MG TABS tablet Take 1 tablet (5 mg total) by mouth 2 (two) times daily. 09/20/19   End, Harrell Gave, MD  DULoxetine (CYMBALTA) 30 MG capsule Take one 30 mg tablet by mouth once a day for the first week. Then increase to two 30 mg tablets ( total 60mg ) by mouth once daily. 12/03/19   [name], MD  flecainide (TAMBOCOR) 100 MG tablet TAKE 1 TABLET(100 MG) BY MOUTH TWICE DAILY 09/20/19   Richardson Dopp T, PA-C  furosemide (LASIX) 20 MG tablet Take 1 tablet (20 mg total) by mouth daily. 11/08/19   [name], [name], MD  gabapentin (NEURONTIN) 100 MG capsule Take 2 capsules (200 mg total) by mouth at bedtime. 09/21/19   Lyndal Pulley, DO  letrozole [facility]) 2.5 MG tablet TAKE 1 TABLET(2.5 MG) BY MOUTH DAILY 04/06/19   [name], MD  metoprolol tartrate (LOPRESSOR) 25 MG tablet Take 0.5 tablets (12.5 mg total) by mouth 2 (two) times daily. 09/23/19   [name], [name], MD  Multiple Vitamins-Minerals (PRESERVISION AREDS 2+MULTI VIT PO) Take by mouth daily.    [provider]  omeprazole (PRILOSEC) 40 MG capsule Take 1 capsule (40 mg total) by mouth daily. 12/15/19   [name], [name], MD  oxyCODONE (OXY IR/ROXICODONE) 5 MG immediate release tablet Take 5 mg by mouth every 6 (six) hours as needed. 02/29/20   [provider]    Allergies    Penicillins, Codeine,  Acetaminophen, Dabigatran etexilate mesylate, Dabigatran etexilate mesylate, Sulfa antibiotics, and Sulfonamide derivatives  Review of Systems   Review of Systems  Constitutional: Negative for chills, diaphoresis, fatigue and fever.  HENT: Negative for congestion.   Eyes: Negative for visual disturbance.  Respiratory: Negative for cough, chest tightness, shortness of breath and wheezing.   Cardiovascular: Negative for chest pain, palpitations and leg swelling.  Gastrointestinal: Negative for abdominal pain, diarrhea, nausea and vomiting.  Genitourinary: Negative for dysuria and flank pain.  Musculoskeletal: Positive for back pain (chronic and intermittnet. no current pain. ). Negative for neck pain and neck stiffness.  Skin: Positive for color change (bruising) and wound (healing laceration on eyebrow). Negative for rash.  Neurological: Positive for headaches. Negative for dizziness, seizures, syncope, speech difficulty, weakness (no currnet), light-headedness  and numbness (on current).  Psychiatric/Behavioral: Negative for agitation and confusion.  All other systems reviewed and are negative.   Physical Exam Updated Vital Signs BP (!) 144/66   Pulse 62   Temp 98.5 F (36.9 C) (Oral)   Resp 18   Ht 5\' 4"  (1.626 m)   Wt 72.6 kg   SpO2 100%   BMI 27.46 kg/m   Physical Exam Vitals and nursing note reviewed.  Constitutional:      General: She is not in acute distress.    Appearance: She is well-developed. She is not ill-appearing, toxic-appearing or diaphoretic.  HENT:     Head: Contusion and laceration (old) present. No raccoon eyes or abrasion.     Jaw: No trismus or tenderness.      Right Ear: External ear normal.     Left Ear: External ear normal.     Nose: Nose normal. No congestion or rhinorrhea.     Mouth/Throat:     Mouth: Mucous membranes are moist.     Pharynx: No oropharyngeal exudate or posterior oropharyngeal erythema.  Eyes:     Conjunctiva/sclera:  Conjunctivae normal.     Pupils: Pupils are equal, round, and reactive to light.  Cardiovascular:     Rate and Rhythm: Normal rate.     Pulses: Normal pulses.     Heart sounds: No murmur.  Pulmonary:     Effort: Pulmonary effort is normal. No respiratory distress.     Breath sounds: No stridor. No wheezing, rhonchi or rales.  Chest:     Chest wall: No tenderness.  Abdominal:     General: Abdomen is flat. There is no distension.     Tenderness: There is no abdominal tenderness. There is no right CVA tenderness, left CVA tenderness, guarding or rebound.  Musculoskeletal:        General: No tenderness. Normal range of motion.     Cervical back: Normal range of motion and neck supple. No tenderness.     Right lower leg: No edema.     Left lower leg: No edema.     Comments: No back tenderness. No focal neuro deficits    Skin:    General: Skin is warm.     Capillary Refill: Capillary refill takes less than 2 seconds.     Coloration: Skin is not pale.     Findings: Bruising present. No erythema or rash.  Neurological:     General: No focal deficit present.     Mental Status: She is alert and oriented to person, place, and time.     Cranial Nerves: No cranial nerve deficit.     Sensory: No sensory deficit.     Motor: No weakness or abnormal muscle tone.     Coordination: Coordination normal.     Deep Tendon Reflexes: Reflexes are normal and symmetric.  Psychiatric:        Mood and Affect: Mood normal.     ED Results / Procedures / Treatments   Labs (all labs ordered are listed, but only abnormal results are displayed) Labs Reviewed - No data to display  EKG None  Radiology CT Head Wo Contrast  Result Date: 03/23/2020 CLINICAL DATA:  Headache, head trauma, on anticoagulation. Multiple falls over the last month. EXAM: CT HEAD WITHOUT CONTRAST TECHNIQUE: Contiguous axial images were obtained from the base of the skull through the vertex without intravenous contrast. COMPARISON:   None. FINDINGS: Brain: No intracranial hemorrhage, mass effect, or midline shift. Age related atrophy with  mild to moderate chronic small vessel ischemia. No hydrocephalus. The basilar cisterns are patent. No evidence of territorial infarct or acute ischemia. No extra-axial or intracranial fluid collection. Vascular: No hyperdense vessel. Skull: No fracture or focal lesion. Sinuses/Orbits: No acute findings. The left mastoid air cells are hypo pneumatized. Bilateral cataract resection. Other: None. IMPRESSION: 1. No acute intracranial abnormality. No skull fracture. 2. Age related atrophy and chronic small vessel ischemia. Electronically Signed   By: Keith Rake M.D.   On: 03/23/2020 13:32    Procedures Procedures (including critical care time)  Medications Ordered in ED Medications - No data to display  ED Course  I have reviewed the triage vital signs and the nursing notes.  Pertinent labs & imaging results that were available during my care of the patient were reviewed by me and considered in my medical decision making (see chart for details).    MDM Rules/Calculators/A&P                      LIRIA PARCEL is a 86 y.o. female with a past medical history significant for osteoarthritis, hyperlipidemia, GERD, atrial fibrillation on Eliquis therapy, and chronic back pains who presents with recurrent falls.  Patient reports that her fall problems began over 2 years ago when she had a fall onto her backside.  She reports that she had some x-rays at that time that were reassuring she reports but over the last several months has had worsening symptoms.  She reports for the last few months she has been having episodes where her legs start hurting her and she feels like they get weak leading her to have more falls.  She reports that over the last month she has had around 3 falls.  She reports she also has back pain at this time however she denies any symptoms currently.  She says that the reason  she came in today was because she fell backwards last night and hit her head when her legs felt like they were getting weak.  Again, she says that she had no preceding other symptoms including no other fevers, chills, chest pain, shortness of breath, nausea, vomiting, loss of bowel or bladder control, or any recent medication changes.  On exam, patient had some tenderness on the back of her head where she had a small bump.  There was also some old bruising on her left forehead and an old laceration from several days ago.  She had no tenderness whatsoever on her thoracic or lumbar spine and had no flank tenderness.  No muscle spasms.  Legs were very strong on both sides and had normal sensation, reflexes, and pulses.  Abdomen nontender and chest nontender.  Lungs clear.  Patient otherwise well-appearing.  Neck was nontender.  Had a long shared decision-making conversation with patient and husband about work-up.  She reports that she is scheduled to see her PCP tomorrow to discuss the chronic episodes of back pain and leg weakness leading to her falls.  She says that the only reason she came today was to make sure she did not get injured from the fall hitting the back of her head yesterday on blood thinners.  Her CT of the head showed no fracture, bleed, or dislocation.  From a traumatic injury standpoint, do not suspect any new injury from his last fall.  I am however somewhat concerned about the episodes of back pain and bilateral leg weakness leading to all of her falls.  She does  report that intermittently she has numbness as well.  Again, on exam today she had no back or leg symptoms or findings on exam.  Do not feel she has any bleed or cauda equina at this time.  We discussed if she wanted to do blood work or further imaging of her back today to look for injuries from her previous falls however, as she is scheduled to see the PCP tomorrow, she would rather PCP direct the management and work-up.  As she  was symptom-free today otherwise, we felt this was a reasonable plan however, we stressed if he developed any new numbness, tingling, weakness, more falls, or loss of bowel or bladder control, she needs to come to the nearest emergency department immediately.  At that time, she may need CT scans or even MRI imaging to further evaluate for a cord problem leading to these episodes.  Patient and family agreed with plan of care and agreed with the minimal work-up today.  She understands return precautions and follow-up instructions and will follow up tomorrow morning with her PCP for further ongoing work-up of her chronic symptoms.       Final Clinical Impression(s) / ED Diagnoses Final diagnoses:  Fall, initial encounter  Injury of head, initial encounter    Rx / DC Orders ED Discharge Orders    None     Clinical Impression: 1. Fall, initial encounter   2. Injury of head, initial encounter     Disposition: Discharge  Condition: Good  I have discussed the results, Dx and Tx plan with the pt(& family if present). He/she/they expressed understanding and agree(s) with the plan. Discharge instructions discussed at great length. Strict return precautions discussed and pt &/or family have verbalized understanding of the instructions. No further questions at time of discharge.    Discharge Medication List as of 03/23/2020  2:30 PM      Follow Up: [name], [place] [address] [phone]     [facility] [address] [id number] [place] [phone]       Kathy Wares, Gwenyth Allegra, MD 03/23/20 2055

## 2020-03-23 NOTE — ED Notes (Signed)
[facility] ED nursing note: states over the past month as had an increase in falls, states she has noted that the left side is more weak at times. Has had some dizziness at times prior to falling. Denies chest pain, does not currently utilize a walker or cane.

## 2020-03-23 NOTE — Telephone Encounter (Signed)
PATIENT WAS SEEN AT ED   Initial Comment Caller states she fell yesterday, appt tomorrow, needing triaged, head hit, no change in behavior. Symptoms: fell two years ago, severe pain in back, meds didn't work then. Fallen: 4-5 times since mothers day, legs give out. Trimmed rose bush, legs burning and stinging before she went down. Cut eye the other day with picking up a rug. Sherene Sires all the time, thinking brain damage, nothing checked before. Heart and everything ok. [place] Not Listed ED on [address] Translation No Nurse Assessment Nurse: Durene Cal, RN, Brandi Date/Time (Eastern Time): 03/23/2020 10:26:52 AM Confirm and document reason for call. If symptomatic, describe symptoms. ---Caller reports she was trimming a rose bush and fell yesterday; hit her head. Reports multiple falls since mothers Day. Says he has hit her head a lot and feels like her memory is being affected. Has the patient had close contact with a person known or suspected to have the novel coronavirus illness OR traveled / lives in area with major community spread (including international travel) in the last 14 days from the onset of symptoms? * If Asymptomatic, screen for exposure and travel within the last 14 days. ---No Does the patient have any new or worsening symptoms? ---Yes Will a triage be completed? ---Yes Related visit to physician within the last 2 weeks? ---No Does the PT have any chronic conditions? (i.e. diabetes, asthma, this includes High risk factors for pregnancy, etc.) ---Yes List chronic conditions. ---Hx: Afib arthritis Is this a behavioral health or substance abuse call? ---NoPLEASE NOTE: All timestamps contained within this report are represented as [place] Standard Time. CONFIDENTIALTY NOTICE: This fax transmission is intended only for the addressee. It contains information that is legally privileged, confidential or otherwise protected from use or disclosure. If you are not the intended  recipient, you are strictly prohibited from reviewing, disclosing, copying using or disseminating any of this information or taking any action in reliance on or regarding this information. If you have received this fax in error, please notify us immediately by telephone so that we can arrange for its return to [place]. Phone: [phone], Toll-Free: [phone], Fax: [phone] Page: 2 of 2 Call Id: [id number] Guidelines Guideline Title Affirmed Question Affirmed Notes Nurse Date/Time Eilene Ghazi Time) Falls and Falling Sounds like a serious injury to the triager Durene Cal, RN, [place] 03/23/2020 10:38:22 AM Disp. Time Eilene Ghazi Time) Disposition Final User 03/23/2020 10:43:12 AM Go to ED Now Yes Durene Cal, RN, Berdie Ogren Disagree/Comply Comply Caller Understands Yes PreDisposition InappropriateToAsk Care Advice Given Per Guideline GO TO ED NOW: ANOTHER ADULT SHOULD DRIVE: * It is better and safer if another adult drives instead of you. BRING MEDICINES: Comments User: Ermalene Postin, RN Date/Time Eilene Ghazi Time): 03/23/2020 10:28:02 AM Caller has an appt for 2:20pm on 03/24/2020 User: Ermalene Postin, RN Date/Time Eilene Ghazi Time): 03/23/2020 10:34:44 AM Difficult to get clear information from caller. User: Ermalene Postin, RN Date/Time Eilene Ghazi Time): 03/23/2020 10:37:57 AM Caller keeps ref a fall that she had on Mothers Day. Reports she hyper flexed her wrist then and still having issues off an on with the hand. Reports she takes eliquis. User: Ermalene Postin, RN Date/Time Eilene Ghazi Time): 03/23/2020 10:40:00 AM [place] [address] by car and cut head over the left eye a few days ago. User: Ermalene Postin, RN Date/Time Eilene Ghazi Time): 03/23/2020 10:45:29 AM [address] Referrals GO TO FACILITY UNDECIDE

## 2020-03-23 NOTE — ED Triage Notes (Addendum)
Pt reports multiple  Falls with head injuries  x 1 month , pt states " my legs are just week" , heeling lac to left eyebrow from fall x 3 days ago , pt is on blood thinners

## 2020-03-23 NOTE — Discharge Instructions (Signed)
Your CT scan today did not show any evidence of fracture or bleeding in your head.  I suspect you have a mild concussion given the headache.  We had a long shared decision-making conversation with you and your family about further management and as you are seeing your primary doctor tomorrow to discuss your ongoing several months of falls due to bilateral leg symptoms, we agreed together to hold on further imaging and lab work-up today and rather let your primary doctor start this work-up tomorrow.  You were asymptomatic otherwise today with no back pain, back tenderness, leg pain, leg tenderness, or reporting any other preceding symptoms such as infectious or urinary symptoms.  Based on your description of intermittent back pain with a leg symptoms, I do suspect your primary doctor will want to get further work-up on your back as you reported hurting it several years ago without significant imaging being done at that time.  If you develop any leg pain, leg weakness, numbness, loss of bowel or bladder control, or have further falls, please return to the nearest emergency department immediately however, please go to the appointment tomorrow to discuss your further work-up of your chronic problems.  Your imaging today was overall reassuring.

## 2020-03-24 ENCOUNTER — Ambulatory Visit (INDEPENDENT_AMBULATORY_CARE_PROVIDER_SITE_OTHER): Payer: Medicare Other | Admitting: Family Medicine

## 2020-03-24 ENCOUNTER — Encounter: Payer: Self-pay | Admitting: Family Medicine

## 2020-03-24 VITALS — BP 136/62 | HR 68 | Temp 98.3°F | Ht 64.0 in | Wt 163.0 lb

## 2020-03-24 DIAGNOSIS — R29898 Other symptoms and signs involving the musculoskeletal system: Secondary | ICD-10-CM | POA: Diagnosis not present

## 2020-03-24 DIAGNOSIS — D6869 Other thrombophilia: Secondary | ICD-10-CM | POA: Diagnosis not present

## 2020-03-24 DIAGNOSIS — I480 Paroxysmal atrial fibrillation: Secondary | ICD-10-CM

## 2020-03-24 DIAGNOSIS — G1220 Motor neuron disease, unspecified: Secondary | ICD-10-CM | POA: Diagnosis not present

## 2020-03-24 DIAGNOSIS — Z170 Estrogen receptor positive status [ER+]: Secondary | ICD-10-CM

## 2020-03-24 DIAGNOSIS — C50512 Malignant neoplasm of lower-outer quadrant of left female breast: Secondary | ICD-10-CM

## 2020-03-24 NOTE — Progress Notes (Signed)
Phone [phone] In person visit   Subjective:   Bridget Lane is a 86 y.o. year old very pleasant female patient who presents for/with See problem oriented charting Chief Complaint  Patient presents with  . Fall    2 times in 3 weeks     This visit occurred during the SARS-CoV-2 public health emergency.  Safety protocols were in place, including screening questions prior to the visit, additional usage of staff PPE, and extensive cleaning of exam room while observing appropriate contact time as indicated for disinfecting solutions.   Past Medical History-  Patient Active Problem List   Diagnosis Date Noted  . 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, estrogen receptor positive ([place]) 06/03/2018    Priority: High  . Atrial fibrillation 06/14/2011    Priority: High  . Insomnia 08/25/2015    Priority: Medium  . Spinal stenosis in cervical region 05/29/2015    Priority: Medium  . Hyperglycemia 06/07/2014    Priority: Medium  . Hyperlipidemia 01/28/2008    Priority: Medium  . Cervical lymphadenopathy 05/16/2016    Priority: Low  . History of Helicobacter pylori infection 07/06/2014    Priority: Low  . OSA (obstructive sleep apnea) 02/11/2012    Priority: Low  . Long term (current) use of anticoagulants 07/19/2011    Priority: Low  . Chest pain 06/14/2011    Priority: Low  . Depression 01/30/2010    Priority: Low  . History of colonic polyps 01/30/2010    Priority: Low  . ESOPHAGEAL STRICTURE 07/20/2008    Priority: Low  . Osteoarthritis 07/20/2008    Priority: Low  . Multiple thyroid nodules 01/28/2008    Priority: Low  . GERD 01/28/2008    Priority: Low  . Acquired thrombophilia ([place]) 09/01/2019  . Polyarthralgia 07/15/2019  . Osteoporosis 10/30/2018  . Genetic testing 07/02/2018  . Dyspnea on exertion 03/23/2018  . Constipation 08/29/2017    Medications- reviewed and updated Current Outpatient Medications  Medication Sig  Dispense Refill  . DULoxetine (CYMBALTA) 30 MG capsule Take one 30 mg tablet by mouth once a day for the first week. Then increase to two 30 mg tablets ( total 60mg ) by mouth once daily. 60 capsule 3  . flecainide (TAMBOCOR) 100 MG tablet TAKE 1 TABLET(100 MG) BY MOUTH TWICE DAILY 180 tablet 1  . furosemide (LASIX) 20 MG tablet Take 1 tablet (20 mg total) by mouth daily. 90 tablet 2  . gabapentin (NEURONTIN) 100 MG capsule Take 2 capsules (200 mg total) by mouth at bedtime. 180 capsule 3  . letrozole (FEMARA) 2.5 MG tablet TAKE 1 TABLET(2.5 MG) BY MOUTH DAILY 90 tablet 3  . metoprolol tartrate (LOPRESSOR) 25 MG tablet Take 0.5 tablets (12.5 mg total) by mouth 2 (two) times daily. 30 tablet 8  . Multiple Vitamins-Minerals (PRESERVISION AREDS 2+MULTI VIT PO) Take by mouth daily.    Marland Kitchen omeprazole (PRILOSEC) 40 MG capsule Take 1 capsule (40 mg total) by mouth daily.     No current facility-administered medications for this visit.     Objective:  BP 136/62   Pulse 68   Temp 98.3 F (36.8 C) (Temporal)   Ht 5\' 4"  (1.626 m)   Wt 163 lb (73.9 kg)   SpO2 95%   BMI 27.98 kg/m  Gen: NAD, resting comfortably Ext: no edema Skin: warm, dry Neuro: Patient with significant pain when trying to stand-she reports perception of weakness in her legs.  While seated-5 out of 5 strength with  lower leg extension and flexion    Assessment and Plan   # Falls/lower extremity weakness/knee pain  #Cervical spinal stenosis-pain improved with Cymbalta and gabapentin S:Patient states she has had 3 falls in last two weeks, 5 since mothers day. Most recent was yesterday. She was seen at ED for evaluation.  Patient had a fall night before visiting the emergency room where she fell backwards and hit her head when her legs felt like they were getting weak.  No fever/chills/chest pain/shortness of breath/nausea/vomiting/loss of bowel or bladder reported.  She did have slight tenderness in the back of her scalp and strength  seemed reasonable in her legs.CT of her head showed no fracture/bleeding or dislocation. She states has hit her head several times.   In regards to her falls-She states that she feels like her legs just give out. Weakness in left greater than right.  She also has pain at her knees which shoots down into her lower legs and seems to start above the knees.  She states the last few times she has fallen on her knees. She denies any dizziness. She has had increased weakness. She tires easily. She would like to review medications not sure she needs her heart medicine.     During reported leg weakness they were concerned about potential lumbar spine issues particularly with ongoing low back pain issues.  Last imaging was 06/18/2019 with x-ray of lumbar spine-no recent MRI of the lumbar spine.  Takes gabapentin at night but no falls at night thankfully. Has a cane but not using it. Doesn't want to use a walker. She feels like her knees is what's throwing her off.  A/P: 86 year old female with recurrent falls which she attributes to lower extremity weakness/knee pain.  We will start with MRI lumbar spine-has history of cervical spinal stenosis and lumbar spinal stenosis would certainly be a possibility.  She has had significant low back pain improvement on Cymbalta as well as evident to stop this-also seem to be doing well on gabapentin and not having falls at nighttime so opted against this-see discussion below -Patient does have known arthritis of her bilateral knees as per x-ray January 2020-would consider steroid injection but opted to pursue MRI first From [facility] "  Patient Instructions  I would really like for you to use a cane consistently.  If you still have falls with a cane I want you to use a walker-we can write a prescription if needed.  We really need to avoid falls.  Please let us know if you have new or recurrent issues such as falls or new issues such as worsening leg weakness, fecal or urinary  incontinence, numbness or tingling in your groin  Lets get an MRI of your low back to see if there is a nerve issue causing the falls.  Depending on severity of low back findings-may get neurosurgery referral.  It does not look like obvious surgical intervention needed may also get opinion of sports medicine-Dr. Tamala Julian.  If your knee pain worsens you can call for sooner follow-up with him-we do not have availability for x-ray this afternoon unfortunately so I will have to hold off on x-rays.  Stay off of the Eliquis for now-I remove this from your medication list-we are stopping this until we can make sure you are not having falls  I also considered stopping gabapentin which can increase fall risk but since you are not having falls at night we will hold off.  I also considered stopping the  Cymbalta which can increase fall risk but since most of her issues seem to be more related to weakness we opted to hold off.    " # Atrial fibrillation/acquired thrombophilia-due to atrial fibrillation S: Rate controlled with flecainide and metoprolol Anticoagulated with Eliquis Chadsvasc score of 3 Patient is  followed by cardiology: Dr. Acie Fredrickson last visit A/P: I am concerned about patient's recent falls including hitting her head-we jointly agreed to stop Eliquis for now.  Continue flecainide and metoprolol  #Breast cancer-patient remains on Femara and has regular follow-up with oncology Dr. Lindi Adie  #Memory loss-previously referred to neurology-we will have team check on status of this  Recommended follow up: August follow-up scheduled but happy to see you sooner if needed-definitely want to see you back if you continue to have falls Future Appointments  Date Time Provider [place]  04/10/2020  8:15 AM [name], MD [facility] None  06/12/2020  1:20 PM [name], MD [facility]    Lab/Order associations:   ICD-10-CM   1. Weakness of both lower extremities  R29.898 MR Lumbar  Spine Wo Contrast  2. 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 Paroxysmal atrial fibrillation ([facility])  I48.0   4. Acquired thrombophilia ([place]) Chronic D68.69   5. 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, estrogen receptor positive ([place]) Chronic C50.512    Z17.0    Return precautions advised.  Garret Reddish, MD

## 2020-03-24 NOTE — Assessment & Plan Note (Signed)
S: Rate controlled with flecainide and metoprolol Anticoagulated with Eliquis Chadsvasc score of 3 Patient is  followed by cardiology: Dr. Acie Fredrickson last visit A/P: I am concerned about patient's recent falls including hitting her head-we jointly agreed to stop Eliquis for now.  Continue flecainide and metoprolol

## 2020-03-24 NOTE — Patient Instructions (Addendum)
I would really like for you to use a cane consistently.  If you still have falls with a cane I want you to use a walker-we can write a prescription if needed.  We really need to avoid falls.  Please let us know if you have new or recurrent issues such as falls or new issues such as worsening leg weakness, fecal or urinary incontinence, numbness or tingling in your groin  Lets get an MRI of your low back to see if there is a nerve issue causing the falls.  Depending on severity of low back findings-may get neurosurgery referral.  It does not look like obvious surgical intervention needed may also get opinion of sports medicine-Dr. Tamala Julian.  If your knee pain worsens you can call for sooner follow-up with him-we do not have availability for x-ray this afternoon unfortunately so I will have to hold off on x-rays.  Stay off of the Eliquis for now-I remove this from your medication list-we are stopping this until we can make sure you are not having falls  I also considered stopping gabapentin which can increase fall risk but since you are not having falls at night we will hold off.  I also considered stopping the Cymbalta which can increase fall risk but since most of her issues seem to be more related to weakness we opted to hold off.  Recommended follow up: August follow-up scheduled but happy to see you sooner if needed-definitely want to see you back if you continue to have falls   Please call Neurology for appointment: Phone: [phone]

## 2020-04-03 ENCOUNTER — Telehealth: Payer: Self-pay | Admitting: Family Medicine

## 2020-04-03 ENCOUNTER — Other Ambulatory Visit: Payer: Self-pay

## 2020-04-03 DIAGNOSIS — R29898 Other symptoms and signs involving the musculoskeletal system: Secondary | ICD-10-CM

## 2020-04-03 DIAGNOSIS — G1220 Motor neuron disease, unspecified: Secondary | ICD-10-CM

## 2020-04-03 NOTE — Telephone Encounter (Signed)
New referral placed.

## 2020-04-03 NOTE — Telephone Encounter (Signed)
error 

## 2020-04-03 NOTE — Telephone Encounter (Signed)
Patient is calling in saying she tried contacting the neurologist but the referral was expired, they need a  new referral in place in order to get her scheduled.

## 2020-04-05 ENCOUNTER — Ambulatory Visit: Payer: Medicare Other | Admitting: Hematology and Oncology

## 2020-04-06 ENCOUNTER — Other Ambulatory Visit: Payer: Self-pay | Admitting: Family Medicine

## 2020-04-09 NOTE — Assessment & Plan Note (Deleted)
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  Current treatment: Letrozole 2.5 mg daily started 07/02/2018, switched to anastrozole and switched back to letrozole Osteopenia: T score -2.1 calcium and vitamin D  Letrozoletoxicities: Joint stiffness and achiness: Not bothering her as much by taking it at day time.  Breast cancer surveillance:  Left breast mammogram 05/25/2019: No evidence of malignancy, breast density category B Breast exam: benign  Osteopenia: Could not tolerate bisphosphonate therapy.  Return to clinic in 1 year for follow-up

## 2020-04-10 ENCOUNTER — Other Ambulatory Visit: Payer: Self-pay

## 2020-04-10 ENCOUNTER — Inpatient Hospital Stay: Payer: Medicare Other | Attending: Hematology and Oncology | Admitting: Hematology and Oncology

## 2020-04-26 NOTE — Progress Notes (Signed)
Patient Care Team: [name], MD as PCP - General (Family Medicine) [name], [name], MD as PCP - Cardiology (Cardiology) Erroll Luna, MD as Consulting Physician (General Surgery) [name], MD as Consulting Physician (Hematology and Oncology) Kyung Rudd, MD as Consulting Physician (Radiation Oncology)  DIAGNOSIS:    ICD-10-CM   1. 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, estrogen receptor positive ([place])  05/29/2018 Initial Diagnosis   Screening detected left breast calcifications 7 mm size axilla negative biopsy revealed grade 1 invasive ductal carcinoma with calcifications ER 100%, PR 90%, Ki-67 2%, HER-2 negative, T1 be N0 stage Ia AJCC 8   06/25/2018 Surgery   Left lumpectomy: IDC grade 1, 0.4 cm, DCIS low-grade, margins negative, left additional medial excision: DCIS low-grade, margins negative, ER 100%, PR 90%, HER-2 negative, Ki-67 2%, T1 a N0 stage Ia   07/06/2018 -  Anti-estrogen oral therapy   Letrozole 2.5 mg daily, changed to Anastrozole in 11/2018 due to achiness     CHIEF COMPLIANT: Follow-up of left breast cancer on letrozole therapy  INTERVAL HISTORY: Bridget Lane is a 86 y.o. with above-mentioned history of left breast cancer treated with lumpectomy and who is currently on anti-estrogen therapy with anastrozole, after she could not tolerate letrozole. Mammogram on 05/25/19 showed no evidence of malignancy bilaterally. She presents today for follow-up.  She is extremely weak in her muscles.  She had fallen several times.  She is seeing a neurologist.  She has an MRI of her back scheduled.  She had a CT of the head which did not show any abnormalities.  She feels that her leg muscles are weaker.  She has not been exercising regularly.  ALLERGIES:  is  allergic to penicillins, codeine, acetaminophen, dabigatran etexilate mesylate, dabigatran etexilate mesylate, sulfa antibiotics, and sulfonamide derivatives.  MEDICATIONS:  Current Outpatient Medications  Medication Sig Dispense Refill  . DULoxetine (CYMBALTA) 30 MG capsule TAKE 1 CAPSULE BY MOUTH EVERY DAY FOR THE FIRST WEEK, THEN INCREASE TO 2 CAPSULES BY MOUTH EVERY DAY 60 capsule 3  . flecainide (TAMBOCOR) 100 MG tablet TAKE 1 TABLET(100 MG) BY MOUTH TWICE DAILY 180 tablet 1  . furosemide (LASIX) 20 MG tablet Take 1 tablet (20 mg total) by mouth daily. 90 tablet 2  . gabapentin (NEURONTIN) 100 MG capsule Take 2 capsules (200 mg total) by mouth at bedtime. 180 capsule 3  . letrozole (FEMARA) 2.5 MG tablet TAKE 1 TABLET(2.5 MG) BY MOUTH DAILY 90 tablet 3  . metoprolol tartrate (LOPRESSOR) 25 MG tablet Take 0.5 tablets (12.5 mg total) by mouth 2 (two) times daily. 30 tablet 8  . Multiple Vitamins-Minerals (PRESERVISION AREDS 2+MULTI VIT PO) Take by mouth daily.    Marland Kitchen omeprazole (PRILOSEC) 40 MG capsule Take 1 capsule (40 mg total) by mouth daily.     No current facility-administered medications for this visit.    PHYSICAL EXAMINATION: ECOG PERFORMANCE STATUS: 1 - Symptomatic but completely ambulatory  Vitals:   04/27/20 1032  BP: (!) 142/96  Pulse: 68  Resp: 18  Temp: 98.5 F (36.9 C)  SpO2: 97%   Filed Weights   04/27/20 1032  Weight: 161 lb 14.4 oz (73.4 kg)    BREAST: No palpable masses or nodules in either right or left breasts. No palpable axillary supraclavicular or  infraclavicular adenopathy no breast tenderness or nipple discharge. (exam performed in the presence of a chaperone)  LABORATORY DATA:  I have reviewed the data as listed CMP Latest Ref Rng & Units 12/03/2019 09/21/2019 09/21/2019  Glucose 70 - 99 mg/dL 110(H) - 109(H)  BUN 6 - 23 mg/dL 36(H) - 28(H)  Creatinine 0.40 - 1.20 mg/dL 1.35(H) - 1.31(H)  Sodium 135 - 145 mEq/L 140 - 143  Potassium 3.5 - 5.1 mEq/L  4.4 - 3.8  Chloride 96 - 112 mEq/L 104 - 107  CO2 19 - 32 mEq/L 27 - 26  Calcium 8.4 - 10.5 mg/dL 9.6 9.8 9.6  Total Protein 6.0 - 8.3 g/dL 6.8 - 7.1  Total Bilirubin 0.2 - 1.2 mg/dL 0.9 - 0.7  Alkaline Phos 39 - 117 U/L 85 - 75  AST 0 - 37 U/L 31 - 22  ALT 0 - 35 U/L 25 - 16    Lab Results  Component Value Date   WBC 11.1 (H) 12/03/2019   HGB 10.8 (L) 12/03/2019   HCT 33.1 (L) 12/03/2019   MCV 88.1 12/03/2019   PLT 315.0 12/03/2019   NEUTROABS 8.4 (H) 12/03/2019    ASSESSMENT & PLAN:  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, estrogen receptor positive ([facility]) 06/25/2018:Left lumpectomy: IDC grade 1, 0.4 cm, DCIS low-grade, margins negative, left additional medial excision: DCIS low-grade, margins negative, ER 100%, PR 90%, HER-2 negative, Ki-67 2%, T1 a N0 stage Ia Radiation oncology felt that adjuvant radiation is not necessary given her age and favorable prognostic findings.  Current treatment: Letrozole 2.5 mg daily started 07/02/2018, switched to anastrozole and switched back to letrozole Osteopenia: T score -2.1 calcium and vitamin D  Letrozoletoxicities: Joint stiffness and achiness: Not bothering her as much by taking it at day time. I renewed her prescription today.  Breast cancer surveillance:  Left breast mammogram 05/25/2019: No evidence of malignancy, breast density category B Breast exam 04/27/2020: Benign  Osteopenia: Could not tolerate bisphosphonate therapy. Frequent falls: Patient is seeing neurology. Return to clinic in 1 year for follow-up     No orders of the defined types were placed in this encounter.  The patient has a good understanding of the overall plan. she agrees with it. she will call with any problems that may develop before the next visit here.  Total time spent: 20 mins including face to face time and time spent for planning, charting and coordination of care  [name], MD 04/27/2020  I, Cloyde Reams Dorshimer,  am acting as scribe for Dr. [name].  I have reviewed the above documentation for accuracy and completeness, and I agree with the above.

## 2020-04-26 NOTE — Assessment & Plan Note (Addendum)
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  Current treatment: Letrozole 2.5 mg daily started 07/02/2018, switched to anastrozole and switched back to letrozole Osteopenia: T score -2.1 calcium and vitamin D  Letrozoletoxicities: Joint stiffness and achiness: Not bothering her as much by taking it at day time.  Breast cancer surveillance:  Left breast mammogram 05/25/2019: No evidence of malignancy, breast density category B Breast exam 04/27/2020: Benign  Osteopenia: Could not tolerate bisphosphonate therapy.  Return to clinic in 1 year for follow-up

## 2020-04-27 ENCOUNTER — Other Ambulatory Visit: Payer: Self-pay

## 2020-04-27 ENCOUNTER — Inpatient Hospital Stay: Payer: Medicare Other | Attending: Hematology and Oncology | Admitting: Hematology and Oncology

## 2020-04-27 DIAGNOSIS — Z170 Estrogen receptor positive status [ER+]: Secondary | ICD-10-CM | POA: Insufficient documentation

## 2020-04-27 DIAGNOSIS — Z885 Allergy status to narcotic agent status: Secondary | ICD-10-CM | POA: Diagnosis not present

## 2020-04-27 DIAGNOSIS — Z79811 Long term (current) use of aromatase inhibitors: Secondary | ICD-10-CM | POA: Diagnosis not present

## 2020-04-27 DIAGNOSIS — R531 Weakness: Secondary | ICD-10-CM | POA: Insufficient documentation

## 2020-04-27 DIAGNOSIS — M2560 Stiffness of unspecified joint, not elsewhere classified: Secondary | ICD-10-CM | POA: Diagnosis not present

## 2020-04-27 DIAGNOSIS — R296 Repeated falls: Secondary | ICD-10-CM | POA: Diagnosis not present

## 2020-04-27 DIAGNOSIS — C50512 Malignant neoplasm of lower-outer quadrant of left female breast: Secondary | ICD-10-CM | POA: Insufficient documentation

## 2020-04-27 DIAGNOSIS — Z882 Allergy status to sulfonamides status: Secondary | ICD-10-CM | POA: Insufficient documentation

## 2020-04-27 DIAGNOSIS — M8580 Other specified disorders of bone density and structure, unspecified site: Secondary | ICD-10-CM | POA: Diagnosis not present

## 2020-04-27 DIAGNOSIS — Z880 Allergy status to penicillin: Secondary | ICD-10-CM | POA: Diagnosis not present

## 2020-04-27 DIAGNOSIS — Z79899 Other long term (current) drug therapy: Secondary | ICD-10-CM | POA: Insufficient documentation

## 2020-04-27 MED ORDER — LETROZOLE 2.5 MG PO TABS: ORAL_TABLET | ORAL | 3 refills | Status: DC

## 2020-05-01 ENCOUNTER — Telehealth: Payer: Self-pay | Admitting: Hematology and Oncology

## 2020-05-01 NOTE — Telephone Encounter (Signed)
Scheduled per 7/8 los. Called and spoke with pt, confirmed 7/8 appt

## 2020-05-05 ENCOUNTER — Ambulatory Visit
Admission: RE | Admit: 2020-05-05 | Discharge: 2020-05-05 | Disposition: A | Discharge status: Home / Self Care | Payer: Medicare Other | Source: Ambulatory Visit | Attending: Family Medicine | Admitting: Family Medicine

## 2020-05-05 ENCOUNTER — Other Ambulatory Visit: Payer: Self-pay

## 2020-05-05 DIAGNOSIS — M48061 Spinal stenosis, lumbar region without neurogenic claudication: Secondary | ICD-10-CM | POA: Diagnosis not present

## 2020-05-05 DIAGNOSIS — R29898 Other symptoms and signs involving the musculoskeletal system: Secondary | ICD-10-CM

## 2020-05-09 DIAGNOSIS — N899 Noninflammatory disorder of vagina, unspecified: Secondary | ICD-10-CM | POA: Diagnosis not present

## 2020-05-12 ENCOUNTER — Ambulatory Visit: Payer: Self-pay | Admitting: Neurology

## 2020-05-12 ENCOUNTER — Other Ambulatory Visit: Payer: Self-pay

## 2020-05-12 DIAGNOSIS — M4800 Spinal stenosis, site unspecified: Secondary | ICD-10-CM

## 2020-05-19 ENCOUNTER — Other Ambulatory Visit: Payer: Self-pay | Admitting: Physician Assistant

## 2020-05-19 ENCOUNTER — Other Ambulatory Visit: Payer: Self-pay | Admitting: Internal Medicine

## 2020-05-19 NOTE — Telephone Encounter (Signed)
Refill Request.  

## 2020-05-19 NOTE — Telephone Encounter (Signed)
Prescription refill request for Eliquis received. Indication: Atrial Fibrillation Last office visit: 12/15/2019 Dr Acie Fredrickson Scr: 1.35 12/03/2019 Age: 86 Weight:  73.4 kg  Prescription refilled

## 2020-05-24 ENCOUNTER — Telehealth: Payer: Self-pay | Admitting: *Deleted

## 2020-05-24 DIAGNOSIS — Z170 Estrogen receptor positive status [ER+]: Secondary | ICD-10-CM

## 2020-05-24 DIAGNOSIS — C50512 Malignant neoplasm of lower-outer quadrant of left female breast: Secondary | ICD-10-CM

## 2020-05-24 NOTE — Telephone Encounter (Signed)
Bridget Lane left a message stating when she saw Dr Lindi Adie she was having "quicky, little pains" in her left breast. Now the pains are worse and her breast under her arm has changed shape. She has not had a mammogram this year.

## 2020-05-24 NOTE — Telephone Encounter (Signed)
Please order a diagnostic mammogram and ultrasound of the breast

## 2020-05-24 NOTE — Telephone Encounter (Signed)
Bridget Lane notified of orders

## 2020-05-26 ENCOUNTER — Telehealth: Payer: Self-pay

## 2020-05-26 NOTE — Telephone Encounter (Signed)
Pt is wanting to know why she hasnt heard from Dr. Saintclair Halsted. Can we check on that or does Pt need to call?

## 2020-05-29 NOTE — Telephone Encounter (Signed)
Called Dr. Windy Carina office and LVM to give me a call back.

## 2020-05-29 NOTE — Telephone Encounter (Signed)
Received call from Dr. Windy Carina office. They do not accept pts insurance. They have tried to contact pt to tell her this. Do you have any other recommendation for neurosurgeon?

## 2020-05-29 NOTE — Telephone Encounter (Signed)
I am fine with whoever accepts her insurance-[facility] or [facility] or [facility] or [employer]

## 2020-05-29 NOTE — Telephone Encounter (Signed)
See below

## 2020-06-05 NOTE — Progress Notes (Deleted)
Phone [phone]   Subjective:  Patient presents today for their annual physical. Chief complaint-noted.   See problem oriented charting- ROS- full  review of systems was completed and negative except for: ***  The following were reviewed and entered/updated in epic: Past Medical History:  Diagnosis Date  . Atrial fibrillation ([place])    persistent  . Colon polyp 2005  . Depression   . DJD (degenerative joint disease)   . Dysrhythmia   . Esophageal stricture   . Family history of breast cancer   . Family history of esophageal cancer   . Family history of prostate cancer   . GERD (gastroesophageal reflux disease)   . H. pylori infection   . Hiatal hernia   . Hyperlipidemia   . Macular degeneration   . Nontoxic multinodular goiter   . OA (osteoarthritis)   . Postherpetic neuralgia at T3-T5 level 04/27/2011  . Rectal bleeding 07/06/2014   Hemorrhoid related in past.     Patient Active Problem List   Diagnosis Date Noted  . Acquired thrombophilia ([place]) 09/01/2019  . Polyarthralgia 07/15/2019  . Osteoporosis 10/30/2018  . Genetic testing 07/02/2018  . 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, estrogen receptor positive ([place]) 06/03/2018  . Dyspnea on exertion 03/23/2018  . Constipation 08/29/2017  . Cervical lymphadenopathy 05/16/2016  . Insomnia 08/25/2015  . Spinal stenosis in cervical region 05/29/2015  . History of Helicobacter pylori infection 07/06/2014  . Hyperglycemia 06/07/2014  . OSA (obstructive sleep apnea) 02/11/2012  . Long term (current) use of anticoagulants 07/19/2011  . Atrial fibrillation 06/14/2011  . Chest pain 06/14/2011  . Depression 01/30/2010  . History of colonic polyps 01/30/2010  . ESOPHAGEAL STRICTURE 07/20/2008  . Osteoarthritis 07/20/2008  . Multiple thyroid nodules 01/28/2008  . Hyperlipidemia 01/28/2008  . GERD 01/28/2008   Past Surgical History:  Procedure Laterality Date  . ANTERIOR CERVICAL  DECOMP/DISCECTOMY FUSION N/A 05/29/2015   Procedure: ANTERIOR CERVICAL DECOMPRESSION/DISCECTOMY FUSION CERVICAL FOUR-FIVE,CERVICAL FIVE-SIX;  Surgeon: Kary Kos, MD;  Location: [place] [facility];  Service: Neurosurgery;  Laterality: N/A;  . APPENDECTOMY    . BREAST LUMPECTOMY Left 06/2018  . BREAST LUMPECTOMY WITH RADIOACTIVE SEED LOCALIZATION Left 06/25/2018   Procedure: BREAST LUMPECTOMY WITH RADIOACTIVE SEED LOCALIZATION;  Surgeon: Erroll Luna, MD;  Location: [place];  Service: General;  Laterality: Left;  . CARDIAC CATHETERIZATION    . CARDIOVERSION  08/29/2011   Procedure: CARDIOVERSION;  Surgeon: Bing Quarry, MD;  Location: [place];  Service: Cardiovascular;  Laterality: N/A;  . CARDIOVERSION  11/22/2011   Procedure: CARDIOVERSION;  Surgeon: Coralyn Mark, MD;  Location: [place];  Service: Cardiovascular;  Laterality: N/A;  . CHOLECYSTECTOMY  1989  . COLONOSCOPY W/ POLYPECTOMY  2005   Neg in 2010; Dr Olevia Perches  . ESOPHAGEAL DILATION     > 3 X; Dr Olevia Perches  . LEFT AND RIGHT HEART CATHETERIZATION WITH CORONARY ANGIOGRAM N/A 12/02/2011   Procedure: LEFT AND RIGHT HEART CATHETERIZATION WITH CORONARY ANGIOGRAM;  Surgeon: Jolaine Artist, MD;  Location: [facility] C[facility];  Service: Cardiovascular;  Laterality: N/A;  . TOTAL ABDOMINAL HYSTERECTOMY  1972   for pain (no BSO)  . TUBAL LIGATION     with appendectomy  . UPPER GI ENDOSCOPY  2010   H pylori    Family History  Problem Relation Age of Onset  . Heart failure Mother   . Coronary artery disease Mother   . Diabetes Mother   . Osteoarthritis Father   .  Coronary artery disease Father   . Prostate cancer Father        in 50s  . Breast cancer Sister        in 49's  . Diabetes Sister   . Esophageal cancer Brother        smoked  . Breast cancer Maternal Aunt     Medications- reviewed and updated Current Outpatient Medications  Medication Sig Dispense Refill  . DULoxetine (CYMBALTA) 30 MG capsule TAKE 1 CAPSULE BY MOUTH  EVERY DAY FOR THE FIRST WEEK, THEN INCREASE TO 2 CAPSULES BY MOUTH EVERY DAY 60 capsule 3  . ELIQUIS 5 MG TABS tablet TAKE 1 TABLET(5 MG) BY MOUTH TWICE DAILY 180 tablet 1  . flecainide (TAMBOCOR) 100 MG tablet TAKE 1 TABLET(100 MG) BY MOUTH TWICE DAILY 180 tablet 1  . furosemide (LASIX) 20 MG tablet Take 1 tablet (20 mg total) by mouth daily. 90 tablet 2  . gabapentin (NEURONTIN) 100 MG capsule Take 2 capsules (200 mg total) by mouth at bedtime. 180 capsule 3  . letrozole (FEMARA) 2.5 MG tablet TAKE 1 TABLET(2.5 MG) BY MOUTH DAILY 90 tablet 3  . metoprolol tartrate (LOPRESSOR) 25 MG tablet Take 0.5 tablets (12.5 mg total) by mouth 2 (two) times daily. 30 tablet 8  . Multiple Vitamins-Minerals (PRESERVISION AREDS 2+MULTI VIT PO) Take by mouth daily.    Marland Kitchen omeprazole (PRILOSEC) 40 MG capsule Take 1 capsule (40 mg total) by mouth daily.     No current facility-administered medications for this visit.    Allergies-reviewed and updated Allergies  Allergen Reactions  . Penicillins Rash  . Codeine Other (See Comments)    hallucinations hallucinations  . Acetaminophen Other (See Comments)    hurts stomach hurts stomach  . Dabigatran Etexilate Mesylate Other (See Comments)     All extremities feel heavy and hurt  . Dabigatran Etexilate Mesylate Other (See Comments)     All extremities feel heavy and hurt  . Sulfa Antibiotics Rash  . Sulfonamide Derivatives Rash    Social History   Social History Narrative   Married 1956. 4 children 2 boys 2 girls. 16 grandkids.  5 greatgrandkids.    Pt lives in [place] with spouse.        Retired from [employer]      Hobbies: travel, spend time with people, family time   Exercise-walking      No HCPOA-advised to do this.    Objective  Objective:  There were no vitals taken for this visit. Gen: NAD, resting comfortably HEENT: Mucous membranes are moist. Oropharynx normal Neck: no thyromegaly CV: RRR no murmurs rubs or  gallops Lungs: CTAB no crackles, wheeze, rhonchi Abdomen: soft/nontender/nondistended/normal bowel sounds. No rebound or guarding.  Ext: no edema Skin: warm, dry Neuro: grossly normal, moves all extremities, PERRLA***   Assessment and Plan   86 y.o. female presenting for annual physical.  Health Maintenance counseling: 1. Anticipatory guidance: Patient counseled regarding regular dental exams ***q6 months, eye exams ***,  avoiding smoking and second hand smoke*** , limiting alcohol to 1 beverage per day*** .   2. Risk factor reduction:  Advised patient of need for regular exercise and diet rich and fruits and vegetables to reduce risk of heart attack and stroke. Exercise- ***. Diet-***.  Wt Readings from Last 3 Encounters:  04/27/20 161 lb 14.4 oz (73.4 kg)  03/24/20 163 lb (73.9 kg)  03/23/20 160 lb (72.6 kg)   3. Immunizations/screenings/ancillary studies Immunization History  Administered Date(s) Administered  .  Fluad Quad(high Dose 65+) 06/18/2019  . Influenza Whole 08/21/2012  . Influenza, High Dose Seasonal PF 08/21/2013  . Influenza-Unspecified 08/04/2014, 07/22/2015, 07/05/2016, 07/08/2018  . PFIZER SARS-COV-2 Vaccination 11/29/2019, 12/24/2019  . Pneumococcal Conjugate-13 09/30/2014  . Pneumococcal Polysaccharide-23 11/05/2016  . Tdap 07/02/2011  . Zoster 09/08/2013   Health Maintenance Due  Topic Date Due  . INFLUENZA VACCINE  05/21/2020   4. Cervical cancer screening- *** 5. Breast cancer screening-  breast exam *** and mammogram *** 6. Colon cancer screening - *** 7. Skin cancer screening- ***advised regular sunscreen use. Denies worrisome, changing, or new skin lesions.  8. Birth control/STD check- *** 9. Osteoporosis screening at 22- *** -*** smoker  Status of chronic or acute concerns  #hyperlipidemia S: Medication:***  Lab Results  Component Value Date   CHOL 214 (H) 11/01/2016   HDL 53.50 11/01/2016   LDLCALC 127 (H) 11/01/2016   TRIG 164.0 (H)  11/01/2016   CHOLHDL 4 11/01/2016   A/P: ***  # Hyperglycemia/insulin resistance/prediabetes S:  Medication: *** Exercise and diet- *** Lab Results  Component Value Date   HGBA1C 5.8 11/01/2016   HGBA1C 5.7 05/09/2015   HGBA1C 6.0 06/07/2014    A/P: ***  # Depression S: Medication: Cymbalta 30Mg  Depression screen [facility] 2/9 03/24/2020 02/02/2020 12/03/2019  Decreased Interest 0 1 2  Down, Depressed, Hopeless 0 1 2  PHQ - 2 Score 0 2 4  Altered sleeping 0 0 2  Tired, decreased energy 3 1 2   Change in appetite 0 1 2  Feeling bad or failure about yourself  1 1 0  Trouble concentrating 1 2 2   Moving slowly or fidgety/restless 0 0 0  Suicidal thoughts 0 0 1  PHQ-9 Score 5 7 13   Difficult doing work/chores - Somewhat difficult Very difficult  Some recent data might be hidden   A/P: ***  # GERD S:***  B12 levels related to PPI use: Omeperazole 40Mg  Lab Results  Component Value Date   VITAMINB12 418 12/03/2019   A/P: ***     possible dementia- neuro refer pending *** *** No diagnosis found.  Recommended follow up: ***No follow-ups on file. Future Appointments  Date Time Provider [place]  06/06/2020 10:40 AM [facility] DIAG [place] 1 [facility] GI-BREAST CE  06/06/2020 10:50 AM GI[facility] [place] 1 GI-[facility]GUS GI-BREAST CE  06/12/2020  1:20 PM [name], MD [facility] PEC  07/07/2020  9:15 AM Kathrynn Ducking, MD [facility] None  04/27/2021 10:30 AM [name], MD [facility] None    No chief complaint on file.  Lab/Order associations:*** fasting No diagnosis found.  No orders of the defined types were placed in this encounter.   Return precautions advised.  Clyde Lundborg, CMA

## 2020-06-06 ENCOUNTER — Other Ambulatory Visit: Payer: Medicare Other

## 2020-06-12 ENCOUNTER — Other Ambulatory Visit: Payer: Self-pay

## 2020-06-12 ENCOUNTER — Ambulatory Visit
Admission: RE | Admit: 2020-06-12 | Discharge: 2020-06-12 | Disposition: A | Discharge status: Home / Self Care | Payer: Medicare Other | Source: Ambulatory Visit | Attending: Hematology and Oncology | Admitting: Hematology and Oncology

## 2020-06-12 ENCOUNTER — Encounter: Payer: Medicare Other | Admitting: Family Medicine

## 2020-06-12 ENCOUNTER — Ambulatory Visit: Payer: Medicare Other

## 2020-06-12 DIAGNOSIS — R928 Other abnormal and inconclusive findings on diagnostic imaging of breast: Secondary | ICD-10-CM | POA: Diagnosis not present

## 2020-06-12 DIAGNOSIS — C50512 Malignant neoplasm of lower-outer quadrant of left female breast: Secondary | ICD-10-CM

## 2020-06-12 DIAGNOSIS — Z853 Personal history of malignant neoplasm of breast: Secondary | ICD-10-CM | POA: Diagnosis not present

## 2020-06-13 ENCOUNTER — Ambulatory Visit (INDEPENDENT_AMBULATORY_CARE_PROVIDER_SITE_OTHER): Payer: Medicare Other | Admitting: Family Medicine

## 2020-06-13 ENCOUNTER — Encounter: Payer: Self-pay | Admitting: Family Medicine

## 2020-06-13 VITALS — BP 140/80 | HR 63 | Temp 98.2°F | Ht 64.0 in

## 2020-06-13 DIAGNOSIS — M4802 Spinal stenosis, cervical region: Secondary | ICD-10-CM | POA: Diagnosis not present

## 2020-06-13 DIAGNOSIS — F3341 Major depressive disorder, recurrent, in partial remission: Secondary | ICD-10-CM | POA: Diagnosis not present

## 2020-06-13 NOTE — Patient Instructions (Addendum)
Health Maintenance Due  Topic Date Due   INFLUENZA VACCINE  - will complete later in flu season (please let us know if you get this at another location so we can update your chart) . We should have vaccination here in 1-2 months - can call back for an appointment.   05/21/2020  Dr. Jannifer Franklin  Phone number Phone: [phone]  Dr. Harl Bowie Phone : [phone] or [phone]  Please call [phone] to schedule a visit with [facility] [facility] -Trey Paula is an excellent counselor who is based out of our clinic  I also think you need to follow up with neurology due to possible memory changes. Team please provide #   Recommended follow up: keep December visit or sooner if needed
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
## 2020-06-19 ENCOUNTER — Other Ambulatory Visit: Payer: Self-pay

## 2020-06-19 MED ORDER — METOPROLOL TARTRATE 25 MG PO TABS: 12.5000 mg | ORAL_TABLET | Freq: Two times a day (BID) | ORAL | 5 refills | Status: DC

## 2020-06-20 ENCOUNTER — Other Ambulatory Visit: Payer: Medicare Other

## 2020-07-07 ENCOUNTER — Encounter: Payer: Self-pay | Admitting: Neurology

## 2020-07-07 ENCOUNTER — Ambulatory Visit: Payer: Medicare Other | Admitting: Neurology

## 2020-07-07 VITALS — BP 133/67 | HR 66 | Ht 64.5 in | Wt 158.0 lb

## 2020-07-07 DIAGNOSIS — M48061 Spinal stenosis, lumbar region without neurogenic claudication: Secondary | ICD-10-CM

## 2020-07-07 DIAGNOSIS — M25562 Pain in left knee: Secondary | ICD-10-CM

## 2020-07-07 DIAGNOSIS — M25561 Pain in right knee: Secondary | ICD-10-CM

## 2020-07-07 DIAGNOSIS — G8929 Other chronic pain: Secondary | ICD-10-CM

## 2020-07-07 DIAGNOSIS — R269 Unspecified abnormalities of gait and mobility: Secondary | ICD-10-CM

## 2020-07-07 HISTORY — DX: Unspecified abnormalities of gait and mobility: R26.9

## 2020-07-07 HISTORY — DX: Spinal stenosis, lumbar region without neurogenic claudication: M48.061

## 2020-07-07 NOTE — Progress Notes (Signed)
Reason for visit: Lumbar spinal stenosis  Referring physician: Dr. Eda Keys MACKENZYE Lane is a 86 y.o. female  History of present illness:  Bridget Lane is an 86 year old right-handed white female with a history of lumbar spinal stenosis.  The patient claims that 2 years ago she fell coming out of the grocery store and within several days she found that she was not able to walk at all.  She gradually regained her ability to walk, but she has felt that her legs have been weak bilaterally since that time, left greater than right.  The patient has had low back pain for many, many years, this has not changed.  She denies a lot of pain down the legs with walking, she may occasionally have some upper thigh pain on the left.  She is more concerned about bilateral knee discomfort when she first stands up.  She has severe pain in both knees, once she starts walking, the knee pain seems to dissipate.  The patient reports some sensation that she is wearing socks on her feet, she may occasionally have some numbness in the hands.  She has arthritis in the hands and has difficulty with handwriting because of this.  She reports some difficulty with urinary incontinence and difficulty fully voiding the bladder.  She has a history of cervical spinal stenosis, she underwent surgery several years ago by Dr. Saintclair Halsted.  She has a history of atrial fibrillation intermittently, she is on Eliquis for this.  She has been falling with some regularity, the last fall was about a month ago.  She refuses to use a cane.  At times when she walks longer distances, she is limited by episodes of increased heart rate and shortness of breath.  The patient does not wish to consider surgical options for her lumbar spinal stenosis at the L4-5 level.  Recent MRI of the lumbar spine has been done.  She comes to this office for further evaluation.  Past Medical History:  Diagnosis Date  . Atrial fibrillation ([place])    persistent  . Colon polyp  2005  . Depression   . DJD (degenerative joint disease)   . Dysrhythmia   . Esophageal stricture   . Family history of breast cancer   . Family history of esophageal cancer   . Family history of prostate cancer   . GERD (gastroesophageal reflux disease)   . H. pylori infection   . Hiatal hernia   . Hyperlipidemia   . Macular degeneration   . Nontoxic multinodular goiter   . OA (osteoarthritis)   . Postherpetic neuralgia at T3-T5 level 04/27/2011  . Rectal bleeding 07/06/2014   Hemorrhoid related in past.      Past Surgical History:  Procedure Laterality Date  . ANTERIOR CERVICAL DECOMP/DISCECTOMY FUSION N/A 05/29/2015   Procedure: ANTERIOR CERVICAL DECOMPRESSION/DISCECTOMY FUSION CERVICAL FOUR-FIVE,CERVICAL FIVE-SIX;  Surgeon: Kary Kos, MD;  Location: [place];  Service: Neurosurgery;  Laterality: N/A;  . APPENDECTOMY    . BREAST LUMPECTOMY Left 06/2018  . BREAST LUMPECTOMY WITH RADIOACTIVE SEED LOCALIZATION Left 06/25/2018   Procedure: BREAST LUMPECTOMY WITH RADIOACTIVE SEED LOCALIZATION;  Surgeon: Erroll Luna, MD;  Location: [facility] [facility];  Service: General;  Laterality: Left;  . CARDIAC CATHETERIZATION    . CARDIOVERSION  08/29/2011   Procedure: CARDIOVERSION;  Surgeon: Bing Quarry, MD;  Location: [facility];  Service: Cardiovascular;  Laterality: N/A;  . CARDIOVERSION  11/22/2011   Procedure: CARDIOVERSION;  Surgeon: Coralyn Mark, MD;  Location: [facility];  Service: Cardiovascular;  Laterality: N/A;  . CHOLECYSTECTOMY  1989  . COLONOSCOPY W/ POLYPECTOMY  2005   Neg in 2010; Dr Olevia Perches  . ESOPHAGEAL DILATION     > 3 X; Dr Olevia Perches  . LEFT AND RIGHT HEART CATHETERIZATION WITH CORONARY ANGIOGRAM N/A 12/02/2011   Procedure: LEFT AND RIGHT HEART CATHETERIZATION WITH CORONARY ANGIOGRAM;  Surgeon: Jolaine Artist, MD;  Location: [facility];  Service: Cardiovascular;  Laterality: N/A;  . TOTAL ABDOMINAL HYSTERECTOMY  1972   for pain (no BSO)  . TUBAL LIGATION     with  appendectomy  . UPPER GI ENDOSCOPY  2010   H pylori    Family History  Problem Relation Age of Onset  . Heart failure Mother   . Coronary artery disease Mother   . Diabetes Mother   . Osteoarthritis Father   . Coronary artery disease Father   . Prostate cancer Father        in 64s  . Breast cancer Sister        in 104's  . Diabetes Sister   . Esophageal cancer Brother        smoked  . Breast cancer Maternal Aunt     Social history:  reports that she has never smoked. She has never used smokeless tobacco. She reports that she does not drink alcohol and does not use drugs.  Medications:  Prior to Admission medications   Medication Sig Start Date End Date Taking? Authorizing Provider  allopurinol (ZYLOPRIM) 100 MG tablet allopurinol 100 mg tablet   Yes [provider]  colchicine 0.6 MG tablet colchicine 0.6 mg tablet   Yes [provider]  DULoxetine (CYMBALTA) 30 MG capsule TAKE 1 CAPSULE BY MOUTH EVERY DAY FOR THE FIRST WEEK, THEN INCREASE TO 2 CAPSULES BY MOUTH EVERY DAY 04/10/20  Yes [name], MD  ELIQUIS 5 MG TABS tablet TAKE 1 TABLET(5 MG) BY MOUTH TWICE DAILY 05/19/20  Yes End, Harrell Gave, MD  flecainide (TAMBOCOR) 100 MG tablet TAKE 1 TABLET(100 MG) BY MOUTH TWICE DAILY 05/22/20  Yes [name], [name], MD  furosemide (LASIX) 20 MG tablet Take 1 tablet (20 mg total) by mouth daily. 11/08/19  Yes [name], [name], MD  gabapentin (NEURONTIN) 100 MG capsule Take 2 capsules (200 mg total) by mouth at bedtime. 09/21/19  Yes Hulan Saas M, DO  letrozole [facility]) 2.5 MG tablet TAKE 1 TABLET(2.5 MG) BY MOUTH DAILY 04/27/20  Yes [name], MD  metoprolol tartrate (LOPRESSOR) 25 MG tablet Take 0.5 tablets (12.5 mg total) by mouth 2 (two) times daily. 06/19/20  Yes [name], [name], MD  Multiple Vitamins-Minerals (PRESERVISION AREDS 2+MULTI VIT PO) Take by mouth daily.   Yes [provider]  omeprazole (PRILOSEC) 40 MG capsule Take 1 capsule (40 mg  total) by mouth daily. 12/15/19  Yes [name], [name], MD  tiZANidine (ZANAFLEX) 4 MG tablet tizanidine 4 mg tablet  TK 1 T PO BID PRN   Yes [provider]  traMADol (ULTRAM) 50 MG tablet tramadol 50 mg tablet   Yes [provider]      Allergies  Allergen Reactions  . Penicillins Rash  . Codeine Other (See Comments)    hallucinations hallucinations  . Acetaminophen Other (See Comments)    hurts stomach hurts stomach  . Dabigatran Etexilate Mesylate Other (See Comments)     All extremities feel heavy and hurt  . Dabigatran Etexilate Mesylate Other (See Comments)  All extremities feel heavy and hurt  . Sulfa Antibiotics Rash  . Sulfonamide Derivatives Rash    ROS:  Out of a complete 14 system review of symptoms, the patient complains only of the following symptoms, and all other reviewed systems are negative.  Walking difficulty Joint pains, knee pain Urinary frequency  Blood pressure 133/67, pulse 66, height 5' 4.5" (1.638 m), weight 158 lb (71.7 kg).  Physical Exam  General: The patient is alert and cooperative at the time of the examination.  Eyes: Pupils are equal, round, and reactive to light. Discs are flat bilaterally.  Neck: The neck is supple, no carotid bruits are noted.  Respiratory: The respiratory examination is clear.  Cardiovascular: The cardiovascular examination reveals a regular rate and rhythm, no obvious murmurs or rubs are noted.  Skin: Extremities are without significant edema.  Neurologic Exam  Mental status: The patient is alert and oriented x 3 at the time of the examination. The patient has apparent normal recent and remote memory, with an apparently normal attention span and concentration ability.  Cranial nerves: Facial symmetry is present. There is good sensation of the face to pinprick and soft touch bilaterally. The strength of the facial muscles and the muscles to head turning and shoulder shrug are normal  bilaterally. Speech is well enunciated, no aphasia or dysarthria is noted. Extraocular movements are full. Visual fields are full. The tongue is midline, and the patient has symmetric elevation of the soft palate. No obvious hearing deficits are noted.  Motor: The motor testing reveals 5 over 5 strength of all 4 extremities. Good symmetric motor tone is noted throughout.  Sensory: Sensory testing is intact to pinprick, soft touch, vibration sensation, and position sense on all 4 extremities. No evidence of extinction is noted.  Coordination: Cerebellar testing reveals good finger-nose-finger and heel-to-shin bilaterally.  Gait and station: Gait is slightly wide-based, the patient can walk independently.  Tandem gait is unsteady.  The patient is able to walk on the heels and the toes bilaterally.  Romberg is negative. No drift is seen.  Reflexes: Deep tendon reflexes are symmetric, but are depressed bilaterally. Toes are downgoing bilaterally.   MRI lumbar 05/08/20:  IMPRESSION: 1. Grade 2 anterolisthesis of L4 on L5 with severe canal stenosis and moderate bilateral foraminal stenosis at L4-L5. 2. No additional levels of significant foraminal or canal stenosis. 3. Mildly dilated common bile duct measuring 12 mm in diameter, which may be related to post cholecystectomy changes.  * MRI scan images were reviewed online. I agree with the written report.    Assessment/Plan:  1.  Lumbar spinal stenosis, L4-5 level, severe  2.  Bilateral knee discomfort, arthritis  3.  Gait instability  The patient does have significant lumbar spinal stenosis, it is not clear that she is having a lot of pain down the legs associated with this, her main discomfort is associated with joint pain in the knees that is most severe when she is first standing up and starting to walk.  The patient will be referred for orthopedic evaluation.  The patient is on anticoagulant therapy, she would have to come off of this  for 5 days if a lumbar epidural steroid injection was considered.  The patient is mainly concerned with her gait instability that likely is associated with the spinal stenosis.  She will be sent for physical therapy for gait training.  She is urged to use a cane when outside the house.  She will follow-up here in 6  months.   Jill Alexanders MD 07/07/2020 10:00 AM  [facility] [address] [place], [place] [address]  Phone [phone] Fax [phone]

## 2020-07-11 ENCOUNTER — Ambulatory Visit: Payer: Medicare Other

## 2020-07-19 ENCOUNTER — Ambulatory Visit: Payer: Self-pay

## 2020-07-19 ENCOUNTER — Encounter: Payer: Self-pay | Admitting: Orthopaedic Surgery

## 2020-07-19 ENCOUNTER — Ambulatory Visit: Payer: Medicare Other | Admitting: Orthopaedic Surgery

## 2020-07-19 VITALS — BP 115/61 | HR 58

## 2020-07-19 DIAGNOSIS — M25562 Pain in left knee: Secondary | ICD-10-CM

## 2020-07-19 DIAGNOSIS — M25561 Pain in right knee: Secondary | ICD-10-CM | POA: Diagnosis not present

## 2020-07-19 NOTE — Progress Notes (Signed)
Office Visit Note   Patient: Bridget Lane           Date of Birth: Mar 20, 1933           MRN: [id number] Visit Date: 07/19/2020              Requested by: Kathrynn Ducking, MD [address] [place],   [id number] PCP: [name], MD   Assessment & Plan: Visit Diagnoses:  1. Left knee pain, unspecified chronicity   2. Right knee pain, unspecified chronicity     Plan: Currently patient not having neurogenic claudication symptoms significant enough to consider decompression surgery.  She certainly does have severe stenosis by plain radiograph this would be labeled L5-S1 however her MRI scan with longer transverse process labeled at L4-5 level.  She has had cervical fusion for cervical stenosis at C4-5.  On return visit we will obtain cervical spine x-rays make sure she has not had any adjacent shifting above or below her previous fusion.  She use the cane is recommended to prevent falling.  Recheck 3 months.  Follow-Up Instructions: No follow-ups on file.   Orders:  Orders Placed This Encounter  Procedures  . XR KNEE 3 VIEW RIGHT  . XR KNEE 3 VIEW LEFT   No orders of the defined types were placed in this encounter.     Procedures: No procedures performed   Clinical Data: No additional findings.   Subjective: Chief Complaint  Patient presents with  . Right Knee - Pain  . Left Knee - Pain    HPI 86 year old female with history of fall 2 years ago and since that time progressive increased problems with balance multiple falls.  When she walks she tends to list to the left side and states "I walk like a drunk person".  She states she has numbness in both feet and ankles but is able to ambulate fairly long distance denies claudication symptoms with standing.  She has right more than left knee osteoarthritis but primarily with prolonged standing and walking she has lower extremity leg problems that extend all the way down to her feet.  Patient still able  to go to the drugstore and to the grocery store.  She has a cane but does not have it with her.  She is never fallen when she has the cane.  We discussed possible knee injection but it appears that her problems are likely from the lumbar stenosis and gait disturbance not related to her knee osteoarthritis.  We can reconsider possible knee injection on return visit.  Review of Systems positive for cervical spinal stenosis with C4-5 fusion 2016, depression atrial fib sleep apnea lumbar spinal stenosis hyperlipidemia.  Bilateral knee osteoarthritis.  Breast cancer.  All other systems are negative.   Objective: Vital Signs: BP 115/61   Pulse (!) 58   Physical Exam Constitutional:      Appearance: She is well-developed.  HENT:     Head: Normocephalic.     Right Ear: External ear normal.     Left Ear: External ear normal.  Eyes:     Pupils: Pupils are equal, round, and reactive to light.  Neck:     Thyroid: No thyromegaly.     Trachea: No tracheal deviation.  Cardiovascular:     Rate and Rhythm: Normal rate.  Pulmonary:     Effort: Pulmonary effort is normal.  Abdominal:     Palpations: Abdomen is soft.  Skin:    General: Skin  is warm and dry.  Neurological:     Mental Status: She is alert and oriented to person, place, and time.  Psychiatric:        Behavior: Behavior normal.     Ortho Exam patient ambulates across the room tends lose her balance does not fall.  Decreased balance when she turns or rotates.  Decreased sensation both lower extremities no lower extremity clonus.  Anterior tib gastrocsoleus is strong.  Negative logroll of the hips negative straight leg raising 90 degrees.  More crepitus right knee than left knee mild effusion.  Palpation of the knee and flexion extension is minimally painful.  She ambulates with both knees slightly flexed. Specialty Comments:  No specialty comments available.  Imaging: No results found.   PMFS History: Patient Active Problem List     Diagnosis Date Noted  . Lumbar spinal stenosis 07/07/2020  . Gait abnormality 07/07/2020  . Acquired thrombophilia ([place]) 09/01/2019  . Polyarthralgia 07/15/2019  . Osteoporosis 10/30/2018  . Genetic testing 07/02/2018  . 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, estrogen receptor positive ([place]) 06/03/2018  . Dyspnea on exertion 03/23/2018  . Constipation 08/29/2017  . Cervical lymphadenopathy 05/16/2016  . Insomnia 08/25/2015  . Spinal stenosis in cervical region 05/29/2015  . History of Helicobacter pylori infection 07/06/2014  . Hyperglycemia 06/07/2014  . OSA (obstructive sleep apnea) 02/11/2012  . Long term (current) use of anticoagulants 07/19/2011  . Atrial fibrillation 06/14/2011  . Chest pain 06/14/2011  . Depression 01/30/2010  . History of colonic polyps 01/30/2010  . ESOPHAGEAL STRICTURE 07/20/2008  . Osteoarthritis 07/20/2008  . Multiple thyroid nodules 01/28/2008  . Hyperlipidemia 01/28/2008  . GERD 01/28/2008   Past Medical History:  Diagnosis Date  . Atrial fibrillation ([place])    persistent  . Colon polyp 2005  . Depression   . DJD (degenerative joint disease)   . Dysrhythmia   . Esophageal stricture   . Family history of breast cancer   . Family history of esophageal cancer   . Family history of prostate cancer   . Gait abnormality 07/07/2020  . GERD (gastroesophageal reflux disease)   . H. pylori infection   . Hiatal hernia   . Hyperlipidemia   . Lumbar spinal stenosis 07/07/2020   L4-5  . Macular degeneration   . Nontoxic multinodular goiter   . OA (osteoarthritis)   . Postherpetic neuralgia at T3-T5 level 04/27/2011  . Rectal bleeding 07/06/2014   Hemorrhoid related in past.      Family History  Problem Relation Age of Onset  . Heart failure Mother   . Coronary artery disease Mother   . Diabetes Mother   . Osteoarthritis Father   . Coronary artery disease Father   . Prostate cancer Father        in 55s  . Breast  cancer Sister        in 76's  . Diabetes Sister   . Esophageal cancer Brother        smoked  . Breast cancer Maternal Aunt     Past Surgical History:  Procedure Laterality Date  . ANTERIOR CERVICAL DECOMP/DISCECTOMY FUSION N/A 05/29/2015   Procedure: ANTERIOR CERVICAL DECOMPRESSION/DISCECTOMY FUSION CERVICAL FOUR-FIVE,CERVICAL FIVE-SIX;  Surgeon: Kary Kos, MD;  Location: [place] [facility];  Service: Neurosurgery;  Laterality: N/A;  . APPENDECTOMY    . BREAST LUMPECTOMY Left 06/2018  . BREAST LUMPECTOMY WITH RADIOACTIVE SEED LOCALIZATION Left 06/25/2018   Procedure: BREAST LUMPECTOMY WITH RADIOACTIVE SEED LOCALIZATION;  Surgeon:  Erroll Luna, MD;  Location: [facility];  Service: General;  Laterality: Left;  . CARDIAC CATHETERIZATION    . CARDIOVERSION  08/29/2011   Procedure: CARDIOVERSION;  Surgeon: Bing Quarry, MD;  Location: [place];  Service: Cardiovascular;  Laterality: N/A;  . CARDIOVERSION  11/22/2011   Procedure: CARDIOVERSION;  Surgeon: Coralyn , MD;  Location: [facility] [facility];  Service: Cardiovascular;  Laterality: N/A;  . CHOLECYSTECTOMY  1989  . COLONOSCOPY W/ POLYPECTOMY  2005   Neg in 2010; Dr Olevia Perches  . ESOPHAGEAL DILATION     > 3 X; Dr Olevia Perches  . LEFT AND RIGHT HEART CATHETERIZATION WITH CORONARY ANGIOGRAM N/A 12/02/2011   Procedure: LEFT AND RIGHT HEART CATHETERIZATION WITH CORONARY ANGIOGRAM;  Surgeon: Jolaine Artist, MD;  Location: [facility] LAB;  Service: Cardiovascular;  Laterality: N/A;  . TOTAL ABDOMINAL HYSTERECTOMY  1972   for pain (no BSO)  . TUBAL LIGATION     with appendectomy  . UPPER GI ENDOSCOPY  2010   H pylori   Social History   Occupational History  . Occupation: retired  Tobacco Use  . Smoking status: Never Smoker  . Smokeless tobacco: Never Used  Vaping Use  . Vaping Use: Never used  Substance and Sexual Activity  . Alcohol use: No  . Drug use: No  . Sexual activity: Not on file

## 2020-07-20 ENCOUNTER — Ambulatory Visit: Payer: Medicare Other

## 2020-07-24 ENCOUNTER — Other Ambulatory Visit: Payer: Self-pay

## 2020-07-24 ENCOUNTER — Encounter: Payer: Self-pay | Admitting: Rehabilitation

## 2020-07-24 ENCOUNTER — Ambulatory Visit: Payer: Medicare Other | Attending: Family Medicine | Admitting: Rehabilitation

## 2020-07-24 DIAGNOSIS — M6281 Muscle weakness (generalized): Secondary | ICD-10-CM

## 2020-07-24 DIAGNOSIS — R2689 Other abnormalities of gait and mobility: Secondary | ICD-10-CM

## 2020-07-24 DIAGNOSIS — R2681 Unsteadiness on feet: Secondary | ICD-10-CM

## 2020-07-24 DIAGNOSIS — R296 Repeated falls: Secondary | ICD-10-CM | POA: Diagnosis not present

## 2020-07-24 NOTE — Therapy (Signed)
[facility] [address] [place], [place], [address] Phone: [phone]   Fax:  [phone]  Physical Therapy Evaluation  Patient Details  Name: Bridget Lane MRN: [id number] Date of Birth: Sep 17, 1933 Referring Provider (PT): Margette Fast, MD    Encounter Date: 07/24/2020   PT End of Session - 07/24/20 1307    Visit Number 1    Number of Visits 17    Date for PT Re-Evaluation 09/22/20    Authorization Type BCBS Medicare (needs 10th visit progress note)    PT Start Time 1100    PT Stop Time 1145    PT Time Calculation (min) 45 min    Activity Tolerance Patient tolerated treatment well    Behavior During Therapy [facility] for tasks assessed/performed           Past Medical History:  Diagnosis Date  . Atrial fibrillation ([place])    persistent  . Colon polyp 2005  . Depression   . DJD (degenerative joint disease)   . Dysrhythmia   . Esophageal stricture   . Family history of breast cancer   . Family history of esophageal cancer   . Family history of prostate cancer   . Gait abnormality 07/07/2020  . GERD (gastroesophageal reflux disease)   . H. pylori infection   . Hiatal hernia   . Hyperlipidemia   . Lumbar spinal stenosis 07/07/2020   L4-5  . Macular degeneration   . Nontoxic multinodular goiter   . OA (osteoarthritis)   . Postherpetic neuralgia at T3-T5 level 04/27/2011  . Rectal bleeding 07/06/2014   Hemorrhoid related in past.      Past Surgical History:  Procedure Laterality Date  . ANTERIOR CERVICAL DECOMP/DISCECTOMY FUSION N/A 05/29/2015   Procedure: ANTERIOR CERVICAL DECOMPRESSION/DISCECTOMY FUSION CERVICAL FOUR-FIVE,CERVICAL FIVE-SIX;  Surgeon: Kary Kos, MD;  Location: [facility];  Service: Neurosurgery;  Laterality: N/A;  . APPENDECTOMY    . BREAST LUMPECTOMY Left 06/2018  . BREAST LUMPECTOMY WITH RADIOACTIVE SEED LOCALIZATION Left 06/25/2018   Procedure: BREAST LUMPECTOMY WITH RADIOACTIVE SEED  LOCALIZATION;  Surgeon: Erroll Luna, MD;  Location: [facility];  Service: General;  Laterality: Left;  . CARDIAC CATHETERIZATION    . CARDIOVERSION  08/29/2011   Procedure: CARDIOVERSION;  Surgeon: Bing Quarry, MD;  Location: [facility];  Service: Cardiovascular;  Laterality: N/A;  . CARDIOVERSION  11/22/2011   Procedure: CARDIOVERSION;  Surgeon: Coralyn Mark, MD;  Location: [facility];  Service: Cardiovascular;  Laterality: N/A;  . CHOLECYSTECTOMY  1989  . COLONOSCOPY W/ POLYPECTOMY  2005   Neg in 2010; Dr Olevia Perches  . ESOPHAGEAL DILATION     > 3 X; Dr Olevia Perches  . LEFT AND RIGHT HEART CATHETERIZATION WITH CORONARY ANGIOGRAM N/A 12/02/2011   Procedure: LEFT AND RIGHT HEART CATHETERIZATION WITH CORONARY ANGIOGRAM;  Surgeon: Jolaine Artist, MD;  Location: [facility];  Service: Cardiovascular;  Laterality: N/A;  . TOTAL ABDOMINAL HYSTERECTOMY  1972   for pain (no BSO)  . TUBAL LIGATION     with appendectomy  . UPPER GI ENDOSCOPY  2010   H pylori    There were no vitals filed for this visit.    Subjective Assessment - 07/24/20 1103    Subjective "I found out what is wrong with me but before I did I was falling.  I have been to the Doctor next door and he found that I have a bulging disc in my low back and that that is causing  my leg issues and not being able to get up from a chair.  The orthopedic MD says that I could have surgery to fix this."    Pertinent History Spinal stenosis at L4-S1, B knee pain (L>R), hx of cervical fusion at C4/5, Grade II anteriolisthesis at L4/5, DJD, HLD, afib    Limitations Walking    How long can you walk comfortably? 20-30 mins but with staggering to the side    Patient Stated Goals "I want to be able to take a stroll without falling"    Currently in Pain? Yes    Pain Score 7     Pain Location Knee    Pain Orientation Right;Left    Pain Descriptors / Indicators Aching;Sore    Pain Type Chronic pain    Pain Onset More than a month ago    Pain  Frequency Constant    Aggravating Factors  bending it, sit<>stand, night time    Pain Relieving Factors medication              [facility] PT Assessment - 07/24/20 1112      Assessment   Medical Diagnosis Spinal stenosis with gait abnormality     Referring Provider (PT) Margette Fast, MD     Onset Date/Surgical Date --   about 2 years ago    Prior Therapy PT for neck in past       Precautions   Precautions Fall;Back      Balance Screen   Has the patient fallen in the past 6 months Yes    How many times? several     Has the patient had a decrease in activity level because of a fear of falling?  Yes    Is the patient reluctant to leave their home because of a fear of falling?  Yes      [place] Private residence    Living Arrangements Spouse/significant other    Available Help at Discharge Family;Available PRN/intermittently    Type of Home House    Home Access Stairs to enter    Entrance Stairs-Number of Steps 3   8 in the garage (uses most often)   Entrance Stairs-Rails Right    Home Layout One level    Home Equipment Shower seat;Grab bars - tub/shower   walk in shower with small threshold     Prior Function   Level of Independence Independent    Vocation Retired    Leisure [employer] with girlfriends, walking in neighborhood (3x/week), traveling       Cognition   Overall Cognitive Status Within Functional Limits for tasks assessed      Sensation   Light Touch Impaired Detail    Light Touch Impaired Details Impaired RLE;Impaired LLE   both feet, feels like has a sock on    Hot/Cold Appears Intact      Coordination   Gross Motor Movements are Fluid and Coordinated Yes    Fine Motor Movements are Fluid and Coordinated Yes    Heel Shin Test WFL       ROM / Strength   AROM / PROM / Strength Strength      Strength   Overall Strength Deficits    Overall Strength Comments R hip flex 3+/5, L  hip flex 4/5, R knee ext 3+/5, L knee ext 5/5,  B knee flex 4/5, B ankle DF 4/5, seated B hip abd/add 4/5      Transfers   Transfers Sit to  Stand;Stand to Sit    Sit to Stand 5: Supervision;With upper extremity assist    Sit to Stand Details Verbal cues for precautions/safety;Verbal cues for technique    Five time sit to stand comments  17.72 secs with BUE support     Stand to Sit 5: Supervision    Stand to Sit Details (indicate cue type and reason) Verbal cues for sequencing;Verbal cues for technique      Ambulation/Gait   Ambulation/Gait Yes    Ambulation/Gait Assistance 5: Supervision;4: Min guard;4: Min assist    Ambulation/Gait Assistance Details Pt with unsteady/staggering gait with narrow BOS needing S to min A (esp during balance challenges).  Pt tends to scissor RLE over L at times as well.  PT did have pt trial use of quad tip cane at end of session briefly.  She continued to need demo and verbal cues on sequencing and technique along with cues for wider BOS, but overall did better and feel she definitely needs AD to prevent falls.     Ambulation Distance (Feet) 300 Feet    Assistive device None   and quad tip cane    Gait Pattern Step-through pattern;Step-to pattern;Decreased arm swing - right;Decreased arm swing - left;Decreased stride length;Shuffle;Scissoring;Narrow base of support;Trunk flexed    Ambulation Surface Level;Indoor    Gait velocity 2.08 ft/sec without device.     Stairs Yes    Stairs Assistance 5: Supervision    Stair Management Technique Two rails;Alternating pattern;Forwards    Number of Stairs 4    Height of Stairs 6      Functional Gait  Assessment   Gait assessed  Yes    Gait Level Surface Walks 20 ft, slow speed, abnormal gait pattern, evidence for imbalance or deviates 10-15 in outside of the 12 in walkway width. Requires more than 7 sec to ambulate 20 ft.   7.8 secs    Change in Gait Speed Able to change speed, demonstrates mild gait deviations, deviates 6-10 in outside of the 12 in walkway width, or  no gait deviations, unable to achieve a major change in velocity, or uses a change in velocity, or uses an assistive device.    Gait with Horizontal Head Turns Performs head turns smoothly with slight change in gait velocity (eg, minor disruption to smooth gait path), deviates 6-10 in outside 12 in walkway width, or uses an assistive device.    Gait with Vertical Head Turns Performs task with moderate change in gait velocity, slows down, deviates 10-15 in outside 12 in walkway width but recovers, can continue to walk.    Gait and Pivot Turn Pivot turns safely in greater than 3 sec and stops with no loss of balance, or pivot turns safely within 3 sec and stops with mild imbalance, requires small steps to catch balance.    Step Over Obstacle Is able to step over one shoe box (4.5 in total height) but must slow down and adjust steps to clear box safely. May require verbal cueing.    Gait with Narrow Base of Support Ambulates less than 4 steps heel to toe or cannot perform without assistance.    Gait with Eyes Closed Cannot walk 20 ft without assistance, severe gait deviations or imbalance, deviates greater than 15 in outside 12 in walkway width or will not attempt task.    Ambulating Backwards Cannot walk 20 ft without assistance, severe gait deviations or imbalance, deviates greater than 15 in outside 12 in walkway width or will not  attempt task.    Steps Alternating feet, must use rail.    Total Score 11    FGA comment: < 19 = high risk fall                       Objective measurements completed on examination: See above findings.               PT Education - 07/24/20 1307    Education Details Education on evaluation results, POC, goals, getting quad tip or tripod cane to begin gait training.    Person(s) Educated Patient    Methods Explanation;Demonstration    Comprehension Verbalized understanding;Returned demonstration;Need further instruction            PT Short  Term Goals - 07/24/20 1350      PT SHORT TERM GOAL #1   Title Pt will be independent with initial HEP in order to indicate dec fall risk and improved functional mobility.  (Target Date 08/23/20)    Time 4    Period Weeks    Status New    Target Date 08/23/20      PT SHORT TERM GOAL #2   Title Pt will improve gait speed to >/=2.62 ft/sec with LRAD at mod I level in order to indicate safe community ambulation.    Time 4    Period Weeks    Status New      PT SHORT TERM GOAL #3   Title Pt will ambulate 100' with quad tip cane at S level in simulated household environment in order to indicate safety at home.    Time 4    Period Weeks    Status New      PT SHORT TERM GOAL #4   Title Pt will improve FGA to 14/30 in order to indicate dec fall risk.    Time 4    Period Weeks    Status New      PT SHORT TERM GOAL #5   Title Pt will improve 5TSS to </=15 secs with single UE support only in order to indicate improved strength.    Time 4    Period Weeks    Status New             PT Long Term Goals - 07/24/20 1352      PT LONG TERM GOAL #1   Title Pt will be independent with final HEP in order to indicate dec fall risk and improved functional mobility.  (Target Date: 09/22/20)    Time 8    Period Weeks    Status New    Target Date 09/22/20      PT LONG TERM GOAL #2   Title Pt will ambulate 500' over unlevel outdoor surfaces (short distances in grass to simulate gardening tasks) with LRAD at mod I level in order to indicate safe community negotiation.    Time 8    Period Weeks    Status New      PT LONG TERM GOAL #3   Title Pt will improve FGA to >/=19/30 in order to indicate decreased fall risk.    Time 8    Period Weeks    Status New      PT LONG TERM GOAL #4   Title Pt will negotiate up/down 8 steps with single rail and cane at mod I level in order to indicate improved strength and safety with home entry/exit.    Time 8    Period  Weeks    Status New                   Plan - 07/24/20 1308    Clinical Impression Statement Pt is pleasant 86 y/o woman with recent diagnosis of Grade II anterolisthesis at L4/5, moderate to severe spinal stenosis at L4-S1 with history of previous cervical fusion at C4/5, DJD, HLD, afib and B chronic knee pain.  Per pt, ortho MD recommending surgery which she is agreeable to at this time.  Pt is to let MD know this soon, however upon PT evaluation, pt with generalized weakness in BLEs, 5TSS time of 17.75 secs with heavy UE reliance indicative of fall risk and dec functional strength, gait speed of 2.08 ft/sec with marked unsteadiness indicative of limited community ambulator and FGA of 11/30 indicative of high fall risk.  Pt will benefit from skilled OP neuro to address deficits.    Personal Factors and Comorbidities Age;Comorbidity 3+;Time since onset of injury/illness/exacerbation    Comorbidities see above    Examination-Activity Limitations Stand;Stairs;Squat;Locomotion Level;Transfers    Examination-Participation Restrictions Community Activity;Shop;Cleaning    Stability/Clinical Decision Making Evolving/Moderate complexity    Clinical Decision Making Moderate    Rehab Potential Good    PT Frequency 2x / week   She was only willing to do 1x/wk due to copay   PT Duration 8 weeks    PT Treatment/Interventions Aquatic Therapy;DME Instruction;Gait training;Stair training;Functional mobility training;Therapeutic activities;Therapeutic exercise;Balance training;Neuromuscular re-education;Patient/family education;ADLs/Self Care Home Management;Manual techniques;Passive range of motion;Vestibular    PT Next Visit Plan Establish HEP for balance (compliant surfaces, EC), gait training with quad tip cane (did she get one-did she bring it), add strenthening in pain free range for BLEs.    Consulted and Agree with Plan of Care Patient           Patient will benefit from skilled therapeutic intervention in order to improve  the following deficits and impairments:  Abnormal gait, Decreased balance, Decreased endurance, Decreased knowledge of use of DME, Decreased mobility, Decreased strength, Difficulty walking, Impaired perceived functional ability, Impaired flexibility, Impaired sensation, Postural dysfunction  Visit Diagnosis: Unsteadiness on feet  Repeated falls  Muscle weakness (generalized)  Other abnormalities of gait and mobility     Problem List Patient Active Problem List   Diagnosis Date Noted  . Lumbar spinal stenosis 07/07/2020  . Gait abnormality 07/07/2020  . Acquired thrombophilia ([place]) 09/01/2019  . Polyarthralgia 07/15/2019  . Osteoporosis 10/30/2018  . Genetic testing 07/02/2018  . 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, estrogen receptor positive ([place]) 06/03/2018  . Dyspnea on exertion 03/23/2018  . Constipation 08/29/2017  . Cervical lymphadenopathy 05/16/2016  . Insomnia 08/25/2015  . Spinal stenosis in cervical region 05/29/2015  . History of Helicobacter pylori infection 07/06/2014  . Hyperglycemia 06/07/2014  . OSA (obstructive sleep apnea) 02/11/2012  . Long term (current) use of anticoagulants 07/19/2011  . Atrial fibrillation 06/14/2011  . Chest pain 06/14/2011  . Depression 01/30/2010  . History of colonic polyps 01/30/2010  . ESOPHAGEAL STRICTURE 07/20/2008  . Osteoarthritis 07/20/2008  . Multiple thyroid nodules 01/28/2008  . Hyperlipidemia 01/28/2008  . GERD 01/28/2008    Cameron Sprang, PT, MPT [facility] [address] [place], [place], [address] Phone: [phone]   Fax:  [phone] 07/24/20, 1:56 PM  Name: Bridget Lane MRN: [id number] Date of Birth: [birth year]

## 2020-07-28 ENCOUNTER — Telehealth: Payer: Self-pay | Admitting: Orthopaedic Surgery

## 2020-07-28 NOTE — Telephone Encounter (Signed)
Patient seen on 07-19-20 for back pain and does want to proceed with surgery, just not  immediately.  She states she is taking care of a niece that is in [place] in [place].  Patient is followed by [facility], has Afib.  We can work on clearances will she decides on a upcoming date of surgery.  Please provide surgery sheet.

## 2020-07-31 NOTE — Telephone Encounter (Signed)
Please see below and advise.

## 2020-08-01 ENCOUNTER — Encounter: Payer: Self-pay | Admitting: Physical Therapy

## 2020-08-01 ENCOUNTER — Ambulatory Visit: Payer: Medicare Other | Admitting: Physical Therapy

## 2020-08-01 ENCOUNTER — Other Ambulatory Visit: Payer: Self-pay

## 2020-08-01 DIAGNOSIS — R2689 Other abnormalities of gait and mobility: Secondary | ICD-10-CM

## 2020-08-01 DIAGNOSIS — R296 Repeated falls: Secondary | ICD-10-CM | POA: Diagnosis not present

## 2020-08-01 DIAGNOSIS — M6281 Muscle weakness (generalized): Secondary | ICD-10-CM

## 2020-08-01 DIAGNOSIS — R2681 Unsteadiness on feet: Secondary | ICD-10-CM

## 2020-08-01 NOTE — Patient Instructions (Addendum)
Seated Hip ABDUCTION with TheraBand   SEATED BILATERAL HIP ABDUCTION WITH THERABAND  Starting in a sitting position with good posture with back upright, away from back of chair. Bring knees and feet together and wrap THERABAND around both knees.   Keeping feet together, slowly move knees out to either side to separate your legs.  Hold for 5 seconds. SLOWLY return knees back to starting position, with knees together.   Complete 10 repetition's. .    Side Step (at counter)  Stand in front of a counter so it can be used for support. Stand with feet together and a red resistance band tied just below the knees. From here, side step along the counter. At the end side step back to your starting point.  Keep your toes facing the counter and have tall posture.    Go down the counter and back for 3 laps toward each direction.       Standing Irregular Surface Eyes Closed  Stand with your feet about shoulder width apart on a pillow/s in a corner with a chair in front of you for safety.    Close your eyes for 30 seconds. Do this for 3 reps.   Use the walls/chair as needed for balance.

## 2020-08-01 NOTE — Therapy (Signed)
[facility] [address] [place], [place], [address] Phone: [phone]   Fax:  [phone]  Physical Therapy Treatment  Patient Details  Name: Bridget Lane MRN: [id number] Date of Birth: 10/07/1933 Referring Provider (PT): Margette Fast, MD    Encounter Date: 08/01/2020   PT End of Session - 08/01/20 1240    Visit Number 2    Number of Visits 17    Date for PT Re-Evaluation 09/22/20    Authorization Type BCBS Medicare (needs 10th visit progress note)    PT Start Time 1234    PT Stop Time 1315    PT Time Calculation (min) 41 min    Equipment Utilized During Treatment Gait belt    Activity Tolerance Patient tolerated treatment well;No increased pain    Behavior During Therapy WFL for tasks assessed/performed           Past Medical History:  Diagnosis Date  . Atrial fibrillation ([place])    persistent  . Colon polyp 2005  . Depression   . DJD (degenerative joint disease)   . Dysrhythmia   . Esophageal stricture   . Family history of breast cancer   . Family history of esophageal cancer   . Family history of prostate cancer   . Gait abnormality 07/07/2020  . GERD (gastroesophageal reflux disease)   . H. pylori infection   . Hiatal hernia   . Hyperlipidemia   . Lumbar spinal stenosis 07/07/2020   L4-5  . Macular degeneration   . Nontoxic multinodular goiter   . OA (osteoarthritis)   . Postherpetic neuralgia at T3-T5 level 04/27/2011  . Rectal bleeding 07/06/2014   Hemorrhoid related in past.      Past Surgical History:  Procedure Laterality Date  . ANTERIOR CERVICAL DECOMP/DISCECTOMY FUSION N/A 05/29/2015   Procedure: ANTERIOR CERVICAL DECOMPRESSION/DISCECTOMY FUSION CERVICAL FOUR-FIVE,CERVICAL FIVE-SIX;  Surgeon: Kary Kos, MD;  Location: [facility];  Service: Neurosurgery;  Laterality: N/A;  . APPENDECTOMY    . BREAST LUMPECTOMY Left 06/2018  . BREAST LUMPECTOMY WITH RADIOACTIVE SEED LOCALIZATION  Left 06/25/2018   Procedure: BREAST LUMPECTOMY WITH RADIOACTIVE SEED LOCALIZATION;  Surgeon: Erroll Luna, MD;  Location: [facility];  Service: General;  Laterality: Left;  . CARDIAC CATHETERIZATION    . CARDIOVERSION  08/29/2011   Procedure: CARDIOVERSION;  Surgeon: Bing Quarry, MD;  Location: [facility];  Service: Cardiovascular;  Laterality: N/A;  . CARDIOVERSION  11/22/2011   Procedure: CARDIOVERSION;  Surgeon: Coralyn Mark, MD;  Location: [facility];  Service: Cardiovascular;  Laterality: N/A;  . CHOLECYSTECTOMY  1989  . COLONOSCOPY W/ POLYPECTOMY  2005   Neg in 2010; Dr Olevia Perches  . ESOPHAGEAL DILATION     > 3 X; Dr Olevia Perches  . LEFT AND RIGHT HEART CATHETERIZATION WITH CORONARY ANGIOGRAM N/A 12/02/2011   Procedure: LEFT AND RIGHT HEART CATHETERIZATION WITH CORONARY ANGIOGRAM;  Surgeon: Jolaine Artist, MD;  Location: [facility] CATH LAB;  Service: Cardiovascular;  Laterality: N/A;  . TOTAL ABDOMINAL HYSTERECTOMY  1972   for pain (no BSO)  . TUBAL LIGATION     with appendectomy  . UPPER GI ENDOSCOPY  2010   H pylori    There were no vitals filed for this visit.   Subjective Assessment - 08/01/20 1237    Subjective No new complaitns. No new falls. Some right knee pain after evaluation, "my usual pain". Does have a straight cane with her today that needs adjusted.    Pertinent History Spinal  stenosis at L4-S1, B knee pain (L>R), hx of cervical fusion at C4/5, Grade II anteriolisthesis at L4/5, DJD, HLD, afib    Limitations Walking    How long can you walk comfortably? 20-30 mins but with staggering to the side    Patient Stated Goals "I want to be able to take a stroll without falling"    Currently in Pain? Yes    Pain Score 7     Pain Location Generalized   back and knee   Pain Descriptors / Indicators Aching;Sore    Pain Type Chronic pain    Pain Onset More than a month ago    Pain Frequency Constant    Aggravating Factors  getting up and down    Pain Relieving Factors  medication.                [facility] Adult PT Treatment/Exercise - 08/01/20 1246      Transfers   Transfers Sit to Stand;Stand to Sit    Sit to Stand 5: Supervision;With upper extremity assist    Stand to Sit 5: Supervision      Ambulation/Gait   Ambulation/Gait Yes    Ambulation/Gait Assistance 5: Supervision;4: Min guard    Ambulation/Gait Assistance Details cane adjusted to correct height. cues needed for posture, sequencing with cane and to keep wider base of support for decreased scissoring of feet with gait.     Ambulation Distance (Feet) 80 Feet   x2, plus around gym   Assistive device Straight cane    Gait Pattern Step-through pattern;Step-to pattern;Decreased arm swing - right;Decreased arm swing - left;Decreased stride length;Shuffle;Scissoring;Narrow base of support;Trunk flexed    Ambulation Surface Level;Indoor      Self-Care   Self-Care Other Self-Care Comments    Other Self-Care Comments  pt with new cane. Adjusted to correct height. Also provided pt with several options to obtain rubber quad tip for increased stability.       Exercises   Exercises Other Exercises    Other Exercises  issued HEP for strengthening and balance. Refer to note for details. cues needed on form and technique.            issued to HEP today: Seated Hip ABDUCTION with TheraBand   SEATED BILATERAL HIP Felt  Starting in a sitting position with good posture with back upright, away from back of chair. Bring knees and feet together and wrap THERABAND around both knees.   Keeping feet together, slowly move knees out to either side to separate your legs.  Hold for 5 seconds. SLOWLY return knees back to starting position, with knees together.   Complete 10 repetition's. .    Side Step (at counter)  Stand in front of a counter so it can be used for support. Stand with feet together and a red resistance band tied just below the knees. From here, side step along the  counter. At the end side step back to your starting point.  Keep your toes facing the counter and have tall posture.    Go down the counter and back for 3 laps toward each direction.       Standing Irregular Surface Eyes Closed  Stand with your feet about shoulder width apart on a pillow/s in a corner with a chair in front of you for safety.    Close your eyes for 30 seconds. Do this for 3 reps.   Use the walls/chair as needed for balance.  PT Education - 08/01/20 1320    Education Details where to purchase rubber tips for cane; HEP for strengthening and balance    Person(s) Educated Patient    Methods Explanation;Verbal cues;Demonstration;Handout    Comprehension Verbalized understanding;Returned demonstration;Verbal cues required;Need further instruction            PT Short Term Goals - 07/24/20 1350      PT SHORT TERM GOAL #1   Title Pt will be independent with initial HEP in order to indicate dec fall risk and improved functional mobility.  (Target Date 08/23/20)    Time 4    Period Weeks    Status New    Target Date 08/23/20      PT SHORT TERM GOAL #2   Title Pt will improve gait speed to >/=2.62 ft/sec with LRAD at mod I level in order to indicate safe community ambulation.    Time 4    Period Weeks    Status New      PT SHORT TERM GOAL #3   Title Pt will ambulate 100' with quad tip cane at S level in simulated household environment in order to indicate safety at home.    Time 4    Period Weeks    Status New      PT SHORT TERM GOAL #4   Title Pt will improve FGA to 14/30 in order to indicate dec fall risk.    Time 4    Period Weeks    Status New      PT SHORT TERM GOAL #5   Title Pt will improve 5TSS to </=15 secs with single UE support only in order to indicate improved strength.    Time 4    Period Weeks    Status New             PT Long Term Goals - 07/24/20 1352      PT LONG TERM GOAL #1   Title Pt will be independent  with final HEP in order to indicate dec fall risk and improved functional mobility.  (Target Date: 09/22/20)    Time 8    Period Weeks    Status New    Target Date 09/22/20      PT LONG TERM GOAL #2   Title Pt will ambulate 500' over unlevel outdoor surfaces (short distances in grass to simulate gardening tasks) with LRAD at mod I level in order to indicate safe community negotiation.    Time 8    Period Weeks    Status New      PT LONG TERM GOAL #3   Title Pt will improve FGA to >/=19/30 in order to indicate decreased fall risk.    Time 8    Period Weeks    Status New      PT LONG TERM GOAL #4   Title Pt will negotiate up/down 8 steps with single rail and cane at mod I level in order to indicate improved strength and safety with home entry/exit.    Time 8    Period Weeks    Status New                 Plan - 08/01/20 1240    Clinical Impression Statement Today's skilled session focused on gait training with pt's new cane and issuing of an HEP to begin to address balance/strengthening. No issues or increased pain reported with performance in session. The pt is progressing toward goals and should benefit from  continued PT to progress toward unmet goals.    Personal Factors and Comorbidities Age;Comorbidity 3+;Time since onset of injury/illness/exacerbation    Comorbidities see above    Examination-Activity Limitations Stand;Stairs;Squat;Locomotion Level;Transfers    Examination-Participation Restrictions Community Activity;Shop;Cleaning    Stability/Clinical Decision Making Evolving/Moderate complexity    Rehab Potential Good    PT Frequency 2x / week   She was only willing to do 1x/wk due to copay   PT Duration 8 weeks    PT Treatment/Interventions Aquatic Therapy;DME Instruction;Gait training;Stair training;Functional mobility training;Therapeutic activities;Therapeutic exercise;Balance training;Neuromuscular re-education;Patient/family education;ADLs/Self Care Home  Management;Manual techniques;Passive range of motion;Vestibular    PT Next Visit Plan gait training with quad tip cane; added to HEP more balance ex's (both [employer] and [facility] working, used HEP to go)    Oncologist with Plan of Care Patient           Patient will benefit from skilled therapeutic intervention in order to improve the following deficits and impairments:  Abnormal gait, Decreased balance, Decreased endurance, Decreased knowledge of use of DME, Decreased mobility, Decreased strength, Difficulty walking, Impaired perceived functional ability, Impaired flexibility, Impaired sensation, Postural dysfunction  Visit Diagnosis: Unsteadiness on feet  Repeated falls  Muscle weakness (generalized)  Other abnormalities of gait and mobility     Problem List Patient Active Problem List   Diagnosis Date Noted  . Lumbar spinal stenosis 07/07/2020  . Gait abnormality 07/07/2020  . Acquired thrombophilia ([facility]) 09/01/2019  . Polyarthralgia 07/15/2019  . Osteoporosis 10/30/2018  . Genetic testing 07/02/2018  . 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, estrogen receptor positive ([place]) 06/03/2018  . Dyspnea on exertion 03/23/2018  . Constipation 08/29/2017  . Cervical lymphadenopathy 05/16/2016  . Insomnia 08/25/2015  . Spinal stenosis in cervical region 05/29/2015  . History of Helicobacter pylori infection 07/06/2014  . Hyperglycemia 06/07/2014  . OSA (obstructive sleep apnea) 02/11/2012  . Long term (current) use of anticoagulants 07/19/2011  . Atrial fibrillation 06/14/2011  . Chest pain 06/14/2011  . Depression 01/30/2010  . History of colonic polyps 01/30/2010  . ESOPHAGEAL STRICTURE 07/20/2008  . Osteoarthritis 07/20/2008  . Multiple thyroid nodules 01/28/2008  . Hyperlipidemia 01/28/2008  . GERD 01/28/2008    Willow Ora, PTA, [facility] [address], [place], [place]  [id number] [phone] 08/01/20, 1:32 PM   Name: Bridget Lane MRN: [id number] Date of Birth: 10-11-1933

## 2020-08-03 DIAGNOSIS — H43813 Vitreous degeneration, bilateral: Secondary | ICD-10-CM | POA: Diagnosis not present

## 2020-08-03 DIAGNOSIS — H43391 Other vitreous opacities, right eye: Secondary | ICD-10-CM | POA: Diagnosis not present

## 2020-08-03 DIAGNOSIS — H353124 Nonexudative age-related macular degeneration, left eye, advanced atrophic with subfoveal involvement: Secondary | ICD-10-CM | POA: Diagnosis not present

## 2020-08-03 DIAGNOSIS — H353112 Nonexudative age-related macular degeneration, right eye, intermediate dry stage: Secondary | ICD-10-CM | POA: Diagnosis not present

## 2020-08-03 NOTE — Telephone Encounter (Signed)
See last note, needs ROV and c-spine xrays before surgery decided on. Last visit she was not having enough problems for surgery and could not get approved with that note . She can ROV when she wants to return thanks ucall

## 2020-08-03 NOTE — Telephone Encounter (Signed)
Please see below. I called pt from [facility] office but no answer. He needs to see her back in the office before deciding on surgery.

## 2020-08-05 NOTE — Telephone Encounter (Signed)
Left message on patient's voicemail that Dr. Lorin Mercy would need to see her again before scheduling surgery and c-spine xrays would need to be done too.  Left name and direct number for her to call back to schedule

## 2020-08-07 ENCOUNTER — Encounter: Payer: Self-pay | Admitting: Rehabilitation

## 2020-08-07 ENCOUNTER — Other Ambulatory Visit: Payer: Self-pay

## 2020-08-07 ENCOUNTER — Ambulatory Visit: Payer: Medicare Other | Admitting: Rehabilitation

## 2020-08-07 DIAGNOSIS — R2689 Other abnormalities of gait and mobility: Secondary | ICD-10-CM | POA: Diagnosis not present

## 2020-08-07 DIAGNOSIS — R296 Repeated falls: Secondary | ICD-10-CM

## 2020-08-07 DIAGNOSIS — R2681 Unsteadiness on feet: Secondary | ICD-10-CM

## 2020-08-07 DIAGNOSIS — M6281 Muscle weakness (generalized): Secondary | ICD-10-CM

## 2020-08-07 NOTE — Therapy (Signed)
[facility] [address] [place], [place], [address] Phone: [phone]   Fax:  [phone]  Physical Therapy Treatment  Patient Details  Name: SHAWNTA Lane MRN: [id number] Date of Birth: 04-22-1933 Referring Provider (PT): Margette Fast, MD    Encounter Date: 08/07/2020   PT End of Session - 08/07/20 1114    Visit Number 3    Number of Visits 17    Date for PT Re-Evaluation 09/22/20    Authorization Type BCBS Medicare (needs 10th visit progress note)    PT Start Time 1109   pt late to session   PT Stop Time 1145    PT Time Calculation (min) 36 min    Equipment Utilized During Treatment Gait belt    Activity Tolerance Patient tolerated treatment well;No increased pain    Behavior During Therapy WFL for tasks assessed/performed           Past Medical History:  Diagnosis Date  . Atrial fibrillation ([facility])    persistent  . Colon polyp 2005  . Depression   . DJD (degenerative joint disease)   . Dysrhythmia   . Esophageal stricture   . Family history of breast cancer   . Family history of esophageal cancer   . Family history of prostate cancer   . Gait abnormality 07/07/2020  . GERD (gastroesophageal reflux disease)   . H. pylori infection   . Hiatal hernia   . Hyperlipidemia   . Lumbar spinal stenosis 07/07/2020   L4-5  . Macular degeneration   . Nontoxic multinodular goiter   . OA (osteoarthritis)   . Postherpetic neuralgia at T3-T5 level 04/27/2011  . Rectal bleeding 07/06/2014   Hemorrhoid related in past.      Past Surgical History:  Procedure Laterality Date  . ANTERIOR CERVICAL DECOMP/DISCECTOMY FUSION N/A 05/29/2015   Procedure: ANTERIOR CERVICAL DECOMPRESSION/DISCECTOMY FUSION CERVICAL FOUR-FIVE,CERVICAL FIVE-SIX;  Surgeon: Kary Kos, MD;  Location: [facility];  Service: Neurosurgery;  Laterality: N/A;  . APPENDECTOMY    . BREAST LUMPECTOMY Left 06/2018  . BREAST LUMPECTOMY WITH RADIOACTIVE  SEED LOCALIZATION Left 06/25/2018   Procedure: BREAST LUMPECTOMY WITH RADIOACTIVE SEED LOCALIZATION;  Surgeon: Erroll Luna, MD;  Location: [facility];  Service: General;  Laterality: Left;  . CARDIAC CATHETERIZATION    . CARDIOVERSION  08/29/2011   Procedure: CARDIOVERSION;  Surgeon: Bing Quarry, MD;  Location: [facility];  Service: Cardiovascular;  Laterality: N/A;  . CARDIOVERSION  11/22/2011   Procedure: CARDIOVERSION;  Surgeon: Coralyn Mark, MD;  Location: [facility];  Service: Cardiovascular;  Laterality: N/A;  . CHOLECYSTECTOMY  1989  . COLONOSCOPY W/ POLYPECTOMY  2005   Neg in 2010; Dr Olevia Perches  . ESOPHAGEAL DILATION     > 3 X; Dr Olevia Perches  . LEFT AND RIGHT HEART CATHETERIZATION WITH CORONARY ANGIOGRAM N/A 12/02/2011   Procedure: LEFT AND RIGHT HEART CATHETERIZATION WITH CORONARY ANGIOGRAM;  Surgeon: Jolaine Artist, MD;  Location: [facility] CATH LAB;  Service: Cardiovascular;  Laterality: N/A;  . TOTAL ABDOMINAL HYSTERECTOMY  1972   for pain (no BSO)  . TUBAL LIGATION     with appendectomy  . UPPER GI ENDOSCOPY  2010   H pylori    There were no vitals filed for this visit.   Subjective Assessment - 08/07/20 1111    Subjective Reports that exercises are going well and that she is sore, but a good sore from them.  She also walked 2 miles yesterday.  Pertinent History Spinal stenosis at L4-S1, B knee pain (L>R), hx of cervical fusion at C4/5, Grade II anteriolisthesis at L4/5, DJD, HLD, afib    Patient Stated Goals "I want to be able to take a stroll without falling"    Currently in Pain? Yes    Pain Score 4     Pain Location Leg    Pain Orientation Right;Left    Pain Descriptors / Indicators Sore    Pain Type Chronic pain    Pain Onset More than a month ago    Pain Frequency Intermittent    Aggravating Factors  exercise    Pain Relieving Factors medication                             [facility] Adult PT Treatment/Exercise - 08/07/20 1110       Ambulation/Gait   Ambulation/Gait Yes    Ambulation/Gait Assistance 5: Supervision    Ambulation/Gait Assistance Details Pt presents without cane today and reports she "left it in car."  She states that she has not gotten quad tip but doesn't feel she needs it at this time.  PT had pt ambulate x 4 laps with SPC at S level with continued cues for wider BOS, however overall was more steady than previous session with this PT.  Cues for forward gaze.  Note that at times she gets too close to obstacles and needs cues to walk further around as she tends to get close and cross legs over when getting around obstacle.  Therefore laid down 2 hula hoops to have pt negotiate in figure 8 pattern around them with SPC.  Max cues for leading with inside foot and allow more space for cane between her and obstacle.      Ambulation Distance (Feet) 460 Feet    Assistive device Straight cane    Gait Pattern Step-through pattern;Step-to pattern;Decreased arm swing - right;Decreased arm swing - left;Decreased stride length;Shuffle;Scissoring;Narrow base of support;Trunk flexed    Ambulation Surface Level;Indoor      Neuro Re-ed    Neuro Re-ed Details  Reviewed current corner balance exercise from HEP with feet apart EC on compliant surface.  Pt did well with exercise without UE support and mild sway.  Performed feet shoulder width apart EO with head turns up/down x 10 reps, side/side x 10 reps-added to HEP.  Then progressed to feet together EO on foam x 20 secs, feet together EO with head turns up/down x 10 reps, side/side x 10 reps with increased postural sway therefore did not add these the HEP yet.  Ended in corner with feet together EC on foam x 2 sets of 20 secs.  mild to moderate postural sway, therefore added to HEP.  Standing at counter top, performed forward/backward marching x 4 laps with single UE (light) support with cues for dec speed and upright posture.  Lateral stepping from foam to ground x 10 reps each side  with BUE support.  Marching in place on foam airex x 20 reps with light UE support, ending with alternating cone taps while on foam airex x 10 reps with cues for light tapping to increase time spent in SLS>              Access Code: [email or web address] URL: [email or web address] Date: 08/07/2020 Prepared by: Cameron Sprang  Exercises Wide Stance with Head Rotation on Foam Pad - 1 x daily - 7 x weekly - 1 sets - 10 reps  Wide Stance with Head Nods on Foam Pad - 1 x daily - 7 x weekly - 1 sets - 10 reps Romberg Stance Eyes Closed on Foam Pad - 1 x daily - 7 x weekly - 3 sets - 10 reps      PT Education - 08/07/20 1207    Education Details additional balance HEP    Person(s) Educated Patient    Methods Explanation;Demonstration;Handout    Comprehension Verbalized understanding;Returned demonstration            PT Short Term Goals - 07/24/20 1350      PT SHORT TERM GOAL #1   Title Pt will be independent with initial HEP in order to indicate dec fall risk and improved functional mobility.  (Target Date 08/23/20)    Time 4    Period Weeks    Status New    Target Date 08/23/20      PT SHORT TERM GOAL #2   Title Pt will improve gait speed to >/=2.62 ft/sec with LRAD at mod I level in order to indicate safe community ambulation.    Time 4    Period Weeks    Status New      PT SHORT TERM GOAL #3   Title Pt will ambulate 100' with quad tip cane at S level in simulated household environment in order to indicate safety at home.    Time 4    Period Weeks    Status New      PT SHORT TERM GOAL #4   Title Pt will improve FGA to 14/30 in order to indicate dec fall risk.    Time 4    Period Weeks    Status New      PT SHORT TERM GOAL #5   Title Pt will improve 5TSS to </=15 secs with single UE support only in order to indicate improved strength.    Time 4    Period Weeks    Status New             PT Long Term Goals - 07/24/20 1352      PT LONG TERM GOAL #1   Title  Pt will be independent with final HEP in order to indicate dec fall risk and improved functional mobility.  (Target Date: 09/22/20)    Time 8    Period Weeks    Status New    Target Date 09/22/20      PT LONG TERM GOAL #2   Title Pt will ambulate 500' over unlevel outdoor surfaces (short distances in grass to simulate gardening tasks) with LRAD at mod I level in order to indicate safe community negotiation.    Time 8    Period Weeks    Status New      PT LONG TERM GOAL #3   Title Pt will improve FGA to >/=19/30 in order to indicate decreased fall risk.    Time 8    Period Weeks    Status New      PT LONG TERM GOAL #4   Title Pt will negotiate up/down 8 steps with single rail and cane at mod I level in order to indicate improved strength and safety with home entry/exit.    Time 8    Period Weeks    Status New                 Plan - 08/07/20 1208    Clinical Impression Statement Skilled session focused on continued gait training  with SPC.  She has most difficulty around objects as she tends to get too close causing feet to scissor and have LOB.  Also continued to work on balance on compliant surfaces along with stepping strategy and SLS with additional corner balance exercises added to HEP.  Pt tolerated them well.    Personal Factors and Comorbidities Age;Comorbidity 3+;Time since onset of injury/illness/exacerbation    Comorbidities see above    Examination-Activity Limitations Stand;Stairs;Squat;Locomotion Level;Transfers    Examination-Participation Restrictions Community Activity;Shop;Cleaning    Stability/Clinical Decision Making Evolving/Moderate complexity    Rehab Potential Good    PT Frequency 2x / week   She was only willing to do 1x/wk due to copay   PT Duration 8 weeks    PT Treatment/Interventions Aquatic Therapy;DME Instruction;Gait training;Stair training;Functional mobility training;Therapeutic activities;Therapeutic exercise;Balance training;Neuromuscular  re-education;Patient/family education;ADLs/Self Care Home Management;Manual techniques;Passive range of motion;Vestibular    PT Next Visit Plan gait training with SPC; See if additional balance exercises going well.  Continue with high level balance tasks on uneven surfaces, EC, head motions, high level gait/balance.    PT Home Exercise Plan [email or web address] plus HEP to go exercises    Consulted and Agree with Plan of Care Patient           Patient will benefit from skilled therapeutic intervention in order to improve the following deficits and impairments:  Abnormal gait, Decreased balance, Decreased endurance, Decreased knowledge of use of DME, Decreased mobility, Decreased strength, Difficulty walking, Impaired perceived functional ability, Impaired flexibility, Impaired sensation, Postural dysfunction  Visit Diagnosis: Unsteadiness on feet  Repeated falls  Muscle weakness (generalized)  Other abnormalities of gait and mobility     Problem List Patient Active Problem List   Diagnosis Date Noted  . Lumbar spinal stenosis 07/07/2020  . Gait abnormality 07/07/2020  . Acquired thrombophilia ([facility]) 09/01/2019  . Polyarthralgia 07/15/2019  . Osteoporosis 10/30/2018  . Genetic testing 07/02/2018  . 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, estrogen receptor positive ([place]) 06/03/2018  . Dyspnea on exertion 03/23/2018  . Constipation 08/29/2017  . Cervical lymphadenopathy 05/16/2016  . Insomnia 08/25/2015  . Spinal stenosis in cervical region 05/29/2015  . History of Helicobacter pylori infection 07/06/2014  . Hyperglycemia 06/07/2014  . OSA (obstructive sleep apnea) 02/11/2012  . Long term (current) use of anticoagulants 07/19/2011  . Atrial fibrillation 06/14/2011  . Chest pain 06/14/2011  . Depression 01/30/2010  . History of colonic polyps 01/30/2010  . ESOPHAGEAL STRICTURE 07/20/2008  . Osteoarthritis 07/20/2008  . Multiple thyroid nodules 01/28/2008    . Hyperlipidemia 01/28/2008  . GERD 01/28/2008    Cameron Sprang, PT, MPT [facility] [address] [place], [place], [address] Phone: [phone]   Fax:  [phone] 08/07/20, 12:13 PM  Name: Bridget Lane MRN: [id number] Date of Birth: 28-May-1933

## 2020-08-07 NOTE — Patient Instructions (Signed)
Access Code: [email or web address] URL: [email or web address] Date: 08/07/2020 Prepared by: Cameron Sprang  Exercises Wide Stance with Head Rotation on Foam Pad - 1 x daily - 7 x weekly - 1 sets - 10 reps Wide Stance with Head Nods on Foam Pad - 1 x daily - 7 x weekly - 1 sets - 10 reps Romberg Stance Eyes Closed on Foam Pad - 1 x daily - 7 x weekly - 3 sets - 10 reps

## 2020-08-14 ENCOUNTER — Ambulatory Visit: Payer: Medicare Other | Admitting: Rehabilitation

## 2020-08-21 ENCOUNTER — Ambulatory Visit: Payer: Medicare Other | Attending: Family Medicine | Admitting: Rehabilitation

## 2020-08-23 ENCOUNTER — Telehealth: Payer: Self-pay | Admitting: Physical Therapy

## 2020-08-23 ENCOUNTER — Other Ambulatory Visit: Payer: Self-pay | Admitting: Cardiovascular Disease

## 2020-08-23 ENCOUNTER — Ambulatory Visit: Payer: Medicare Other | Admitting: Physical Therapy

## 2020-08-23 NOTE — Telephone Encounter (Signed)
Called patient about missed appointment today at 3:30. Provided patient with date and time of her next PT appointment and the clinic phone number should she need to cancel this upcoming appointment.  Bridget Lane, PTA, [facility] [address] [place], [place] [address] [phone] 08/23/20, 3:46 PM

## 2020-08-28 ENCOUNTER — Ambulatory Visit: Payer: Medicare Other | Admitting: Rehabilitation

## 2020-08-29 DIAGNOSIS — S8012XA Contusion of left lower leg, initial encounter: Secondary | ICD-10-CM | POA: Diagnosis not present

## 2020-08-29 DIAGNOSIS — M79604 Pain in right leg: Secondary | ICD-10-CM | POA: Diagnosis not present

## 2020-09-01 ENCOUNTER — Other Ambulatory Visit: Payer: Self-pay | Admitting: Family Medicine

## 2020-09-04 ENCOUNTER — Ambulatory Visit: Payer: Medicare Other | Admitting: Rehabilitation

## 2020-09-04 DIAGNOSIS — H524 Presbyopia: Secondary | ICD-10-CM | POA: Diagnosis not present

## 2020-09-06 ENCOUNTER — Ambulatory Visit: Payer: Medicare Other

## 2020-09-11 ENCOUNTER — Ambulatory Visit: Payer: Medicare Other

## 2020-09-18 ENCOUNTER — Ambulatory Visit: Payer: Medicare Other | Admitting: Rehabilitation

## 2020-09-18 NOTE — Progress Notes (Deleted)
Phone [phone] In person visit   Subjective:   Bridget Lane is a 86 y.o. year old very pleasant female patient who presents for/with See problem oriented charting No chief complaint on file.   This visit occurred during the SARS-CoV-2 public health emergency.  Safety protocols were in place, including screening questions prior to the visit, additional usage of staff PPE, and extensive cleaning of exam room while observing appropriate contact time as indicated for disinfecting solutions.   Past Medical History-  Patient Active Problem List   Diagnosis Date Noted  . Lumbar spinal stenosis 07/07/2020  . Gait abnormality 07/07/2020  . Acquired thrombophilia ([place]) 09/01/2019  . Polyarthralgia 07/15/2019  . Osteoporosis 10/30/2018  . Genetic testing 07/02/2018  . 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, estrogen receptor positive ([place]) 06/03/2018  . Dyspnea on exertion 03/23/2018  . Constipation 08/29/2017  . Cervical lymphadenopathy 05/16/2016  . Insomnia 08/25/2015  . Spinal stenosis in cervical region 05/29/2015  . History of Helicobacter pylori infection 07/06/2014  . Hyperglycemia 06/07/2014  . OSA (obstructive sleep apnea) 02/11/2012  . Long term (current) use of anticoagulants 07/19/2011  . Atrial fibrillation 06/14/2011  . Chest pain 06/14/2011  . Depression 01/30/2010  . History of colonic polyps 01/30/2010  . ESOPHAGEAL STRICTURE 07/20/2008  . Osteoarthritis 07/20/2008  . Multiple thyroid nodules 01/28/2008  . Hyperlipidemia 01/28/2008  . GERD 01/28/2008    Medications- reviewed and updated Current Outpatient Medications  Medication Sig Dispense Refill  . allopurinol (ZYLOPRIM) 100 MG tablet allopurinol 100 mg tablet    . colchicine 0.6 MG tablet colchicine 0.6 mg tablet    . DULoxetine (CYMBALTA) 30 MG capsule TAKE 1 CAPSULE BY MOUTH EVERY DAY FOR THE FIRST WEEK, THEN INCREASE TO 2 CAPSULES BY MOUTH EVERY DAY 60 capsule 3  .  ELIQUIS 5 MG TABS tablet TAKE 1 TABLET(5 MG) BY MOUTH TWICE DAILY 180 tablet 1  . flecainide (TAMBOCOR) 100 MG tablet TAKE 1 TABLET(100 MG) BY MOUTH TWICE DAILY 180 tablet 1  . furosemide (LASIX) 20 MG tablet TAKE 1 TABLET(20 MG) BY MOUTH DAILY 90 tablet 1  . gabapentin (NEURONTIN) 100 MG capsule Take 2 capsules (200 mg total) by mouth at bedtime. 180 capsule 3  . letrozole (FEMARA) 2.5 MG tablet TAKE 1 TABLET(2.5 MG) BY MOUTH DAILY 90 tablet 3  . metoprolol tartrate (LOPRESSOR) 25 MG tablet Take 0.5 tablets (12.5 mg total) by mouth 2 (two) times daily. 30 tablet 5  . Multiple Vitamins-Minerals (PRESERVISION AREDS 2+MULTI VIT PO) Take by mouth daily.    Marland Kitchen omeprazole (PRILOSEC) 40 MG capsule Take 1 capsule (40 mg total) by mouth daily.    Marland Kitchen tiZANidine (ZANAFLEX) 4 MG tablet tizanidine 4 mg tablet  TK 1 T PO BID PRN    . traMADol (ULTRAM) 50 MG tablet tramadol 50 mg tablet     No current facility-administered medications for this visit.     Objective:  There were no vitals taken for this visit. Gen: NAD, resting comfortably CV: RRR no murmurs rubs or gallops Lungs: CTAB no crackles, wheeze, rhonchi Abdomen: soft/nontender/nondistended/normal bowel sounds. No rebound or guarding.  Ext: no edema Skin: warm, dry Neuro: grossly normal, moves all extremities  ***    Assessment and Plan  # Discuss Referral  S:***  A/P: ***    possible dementia- neuro refer pending ***  No problem-specific Assessment & Plan notes found for this encounter.   Recommended follow up: ***No follow-ups on file. Future Appointments  Date Time Provider [place]  09/19/2020 10:40 AM [name], MD [facility]  10/12/2020  4:00 PM [name], MD [facility]  01/18/2021 10:00 AM Kathrynn Ducking, MD [facility] None  04/27/2021 10:30 AM [name], MD [facility] None    Lab/Order associations: No diagnosis found.  No orders of the defined types were placed in this  encounter.   Time Spent: *** minutes of total time (4:01 PM***- 4:01 PM***) was spent on the date of the encounter performing the following actions: chart review prior to seeing the patient, obtaining history, performing a medically necessary exam, counseling on the treatment plan, placing orders, and documenting in our EHR.   Return precautions advised.  Thomes Cake, [place]

## 2020-09-19 ENCOUNTER — Ambulatory Visit: Payer: Medicare Other | Admitting: Family Medicine

## 2020-09-24 NOTE — Patient Instructions (Addendum)
Tremor looks to be essential tremor.  She is already on metoprolol 12.5 mg twice daily-we opted to increase that to 25 mg twice daily.  Her heart rate was in the 90s today so I think she can tolerate this.  She agrees if she gets lower heart rate such as under 55 or so to let us know (has cuff at home for BP)  Right knee pain possibly meniscus issue. Will refer to sports medicine as I do not think x-ray would be as beneficial as ultrasound which they can complete. We will call you within two weeks about your referral to [facility]. If you do not hear within 3 weeks, give [place] a call.   Please please please keep visit on march 31st at 10 AM with Dr. Jannifer Franklin.    Essential Tremor A tremor is trembling or shaking that a person cannot control. Most tremors affect the hands or arms. Tremors can also affect the head, vocal cords, legs, and other parts of the body. Essential tremor is a tremor without a known cause. Usually, it occurs while a person is trying to perform an action. It tends to get worse gradually as a person ages. What are the causes? The cause of this condition is not known. What increases the risk? You are more likely to develop this condition if:  You have a family member with essential tremor.  You are age 64 or older.  You take certain medicines. What are the signs or symptoms? The main sign of a tremor is a rhythmic shaking of certain parts of your body that is uncontrolled and unintentional. You may:  Have difficulty eating with a spoon or fork.  Have difficulty writing.  Nod your head up and down or side to side.  Have a quivering voice. The shaking may:  Get worse over time.  Come and go.  Be more noticeable on one side of your body.  Get worse due to stress, fatigue, caffeine, and extreme heat or cold. How is this diagnosed? This condition may be diagnosed based on:  Your symptoms and medical history.  A physical exam. There is no single test to  diagnose an essential tremor. However, your health care provider may order tests to rule out other causes of your condition. These may include:  Blood and urine tests.  Imaging studies of your brain, such as CT scan and MRI.  A test that measures involuntary muscle movement (electromyogram). How is this treated? Treatment for essential tremor depends on the severity of the condition.  Some tremors may go away without treatment.  Mild tremors may not need treatment if they do not affect your day-to-day life.  Severe tremors may need to be treated using one or more of the following options: ? Medicines. ? Lifestyle changes. ? Occupational or physical therapy. Follow these instructions at home: Lifestyle   Do not use any products that contain nicotine or tobacco, such as cigarettes and e-cigarettes. If you need help quitting, ask your health care provider.  Limit your caffeine intake as told by your health care provider.  Try to get 8 hours of sleep each night.  Find ways to manage your stress that fits your lifestyle and personality. Consider trying meditation or yoga.  Try to anticipate stressful situations and allow extra time to manage them.  If you are struggling emotionally with the effects of your tremor, consider working with a mental health provider. General instructions  Take over-the-counter and prescription medicines only as  told by your health care provider.  Avoid extreme heat and extreme cold.  Keep all follow-up visits as told by your health care provider. This is important. Visits may include physical therapy visits. Contact a health care provider if:  You experience any changes in the location or intensity of your tremors.  You start having a tremor after starting a new medicine.  You have tremor with other symptoms, such as: ? Numbness. ? Tingling. ? Pain. ? Weakness.  Your tremor gets worse.  Your tremor interferes with your daily life.  You  feel down, blue, or sad for at least 2 weeks in a row.  Worrying about your tremor and what other people think about you interferes with your everyday life functions, including relationships, work, or school. Summary  Essential tremor is a tremor without a known cause. Usually, it occurs when you are trying to perform an action.  The cause of this condition is not known.  The main sign of a tremor is a rhythmic shaking of certain parts of your body that is uncontrolled and unintentional.  Treatment for essential tremor depends on the severity of the condition. This information is not intended to replace advice given to you by your health care provider. Make sure you discuss any questions you have with your health care provider. Document Revised: 10/17/2017 Document Reviewed: 10/17/2017 Elsevier Patient Education  2020 [employer].

## 2020-09-24 NOTE — Progress Notes (Signed)
Phone [phone] In person visit   Subjective:   Bridget Lane is a 86 y.o. year old very pleasant female patient who presents for/with See problem oriented charting Chief Complaint  Patient presents with  . Neurology Referral   This visit occurred during the SARS-CoV-2 public health emergency.  Safety protocols were in place, including screening questions prior to the visit, additional usage of staff PPE, and extensive cleaning of exam room while observing appropriate contact time as indicated for disinfecting solutions.   Past Medical History-  Patient Active Problem List   Diagnosis Date Noted  . 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, estrogen receptor positive ([place]) 06/03/2018    Priority: High  . Atrial fibrillation 06/14/2011    Priority: High  . Insomnia 08/25/2015    Priority: Medium  . Spinal stenosis in cervical region 05/29/2015    Priority: Medium  . Hyperglycemia 06/07/2014    Priority: Medium  . Depression 01/30/2010    Priority: Medium  . Hyperlipidemia 01/28/2008    Priority: Medium  . Cervical lymphadenopathy 05/16/2016    Priority: Low  . History of Helicobacter pylori infection 07/06/2014    Priority: Low  . OSA (obstructive sleep apnea) 02/11/2012    Priority: Low  . Long term (current) use of anticoagulants 07/19/2011    Priority: Low  . Chest pain 06/14/2011    Priority: Low  . History of colonic polyps 01/30/2010    Priority: Low  . ESOPHAGEAL STRICTURE 07/20/2008    Priority: Low  . Osteoarthritis 07/20/2008    Priority: Low  . Multiple thyroid nodules 01/28/2008    Priority: Low  . GERD 01/28/2008    Priority: Low  . Lumbar spinal stenosis 07/07/2020  . Gait abnormality 07/07/2020  . Acquired thrombophilia ([place]) 09/01/2019  . Polyarthralgia 07/15/2019  . Osteoporosis 10/30/2018  . Genetic testing 07/02/2018  . Dyspnea on exertion 03/23/2018  . Constipation 08/29/2017    Medications- reviewed and  updated Current Outpatient Medications  Medication Sig Dispense Refill  . allopurinol (ZYLOPRIM) 100 MG tablet allopurinol 100 mg tablet    . colchicine 0.6 MG tablet colchicine 0.6 mg tablet    . DULoxetine (CYMBALTA) 30 MG capsule TAKE 1 CAPSULE BY MOUTH EVERY DAY FOR THE FIRST WEEK, THEN INCREASE TO 2 CAPSULES BY MOUTH EVERY DAY 60 capsule 3  . ELIQUIS 5 MG TABS tablet TAKE 1 TABLET(5 MG) BY MOUTH TWICE DAILY 180 tablet 1  . flecainide (TAMBOCOR) 100 MG tablet TAKE 1 TABLET(100 MG) BY MOUTH TWICE DAILY 180 tablet 1  . furosemide (LASIX) 20 MG tablet TAKE 1 TABLET(20 MG) BY MOUTH DAILY 90 tablet 1  . gabapentin (NEURONTIN) 100 MG capsule Take 2 capsules (200 mg total) by mouth at bedtime. 180 capsule 3  . metoprolol tartrate (LOPRESSOR) 25 MG tablet Take 1 tablet (25 mg total) by mouth 2 (two) times daily. 60 tablet 5  . Multiple Vitamins-Minerals (PRESERVISION AREDS 2+MULTI VIT PO) Take by mouth daily.    Marland Kitchen omeprazole (PRILOSEC) 40 MG capsule Take 1 capsule (40 mg total) by mouth daily.    Marland Kitchen tiZANidine (ZANAFLEX) 4 MG tablet tizanidine 4 mg tablet  TK 1 T PO BID PRN    . traMADol (ULTRAM) 50 MG tablet tramadol 50 mg tablet     No current facility-administered medications for this visit.     Objective:  BP 126/66   Pulse 96   Temp 98 F (36.7 C) (Temporal)   Resp 18   Ht  5\' 5"  (1.651 m)   Wt 152 lb 12.8 oz (69.3 kg)   SpO2 98%   BMI 25.43 kg/m  Gen: NAD, resting comfortably CV: RRR no murmurs rubs or gallops Lungs: CTAB no crackles, wheeze, rhonchi Abdomen: soft/nontender/nondistended/normal bowel sounds. No rebound or guarding.  Ext: no edema Skin: warm, dry Neuro: Intention tremor noted with raising hands.  No tremor at rest.  No cogwheel rigidity.  Walks with cane. Right knee examination-patient appears to have an effusion compared to the left knee.  No joint line tenderness noted.  Good range of motion.  No obvious ACL or PCL laxity.  With meniscus testing I do hear a  slight pop but no reported pain with that maneuver.    Assessment and Plan   # social update - apparently undergoing a separation with husband Mortimer Fries. Last visit she had concerns he may be cheating - she did not mention that today. She focused on a sex toy  (sounds like a dildo) that offended her but apparently used once but apparently has had trouble with BMs since that time- smears when she wipes. When she pees feels like urination sprays more (declines UA)- she is going to call urology. No blood in the stool or dark black stool.  - lost niece recently as well  # Discuss referral to neurology S:Patient stated that she doesn't understand why her daughter thinks she needs a referral to neurology.   A/P: we discussed with memory changes that I thought neuro evaluation was reasonable- she is agreeable to keep visit with neurology   # Tremor S:Patient mentioned that here recently she has noticed a tremor to her hands that prevents her from writing. No tremor at rest. Harder time writing A/P: Tremor looks to be essential tremor.  She is already on metoprolol 12.5 mg twice daily-we opted to increase that to 25 mg twice daily.  Her heart rate was in the 90s today so I think she can tolerate this.  She agrees if she gets lower heart rate such as under 55 or so to let us know (has cuff at home for BP)  # Right knee pain S:  hyperextended right knee in the past reports and then later reports also broke that knee in the past.  Having a lot of pain getting in and out of car due to this. fels like pain started a month ago doing some band leg exercises- heard a pop. A lot of pain with getting up and moving- worse as getting up from chair. Radiates some below the knee as well A/P: Right knee pain possibly meniscus issue. Will refer to sports medicine as I do not think x-ray would be as beneficial as ultrasound which they can complete.   Recommended follow up: Return in about 4 months (around 01/24/2021) for  physical or sooner if needed. Future Appointments  Date Time Provider [place]  01/18/2021 10:00 AM Kathrynn Ducking, MD [facility] None  04/27/2021 10:30 AM [name], MD [facility] None   Lab/Order associations:   ICD-10-CM   1. Essential tremor  G25.0   2. Acute pain of right knee  M25.561 Ambulatory referral to Sports Medicine  essential tremor chronic condition requiring medication management as below.   Meds ordered this encounter  Medications  . metoprolol tartrate (LOPRESSOR) 25 MG tablet    Sig: Take 1 tablet (25 mg total) by mouth 2 (two) times daily.    Dispense:  60 tablet    Refill:  5  Return precautions advised.  Garret Reddish, MD

## 2020-09-25 ENCOUNTER — Other Ambulatory Visit: Payer: Self-pay

## 2020-09-25 ENCOUNTER — Ambulatory Visit: Payer: Medicare Other | Admitting: Family Medicine

## 2020-09-25 ENCOUNTER — Encounter: Payer: Self-pay | Admitting: Family Medicine

## 2020-09-25 VITALS — BP 126/66 | HR 96 | Temp 98.0°F | Resp 18 | Ht 65.0 in | Wt 152.8 lb

## 2020-09-25 DIAGNOSIS — M25561 Pain in right knee: Secondary | ICD-10-CM | POA: Diagnosis not present

## 2020-09-25 DIAGNOSIS — G250 Essential tremor: Secondary | ICD-10-CM

## 2020-09-25 MED ORDER — METOPROLOL TARTRATE 25 MG PO TABS: 25.0000 mg | ORAL_TABLET | Freq: Two times a day (BID) | ORAL | 5 refills | Status: DC

## 2020-10-04 NOTE — Progress Notes (Signed)
Subjective:   I, Peterson Lombard, LAT, ATC acting as a scribe for [name], MD.  I'm seeing this patient as a consultation for Dr. Garret Reddish. Note will be routed back to referring provider/PCP.  CC: R knee pain   HPI: Pt is a 86yo female c/o right knee pain. Pt reports a PMhx of R knee injury (hyperextending and "breaking"). Pt has previously been seen by orthopedics and neurology for knee pain. Today, pt reports knee pain started worsening about a month ago when she was doing theraband exercises and heard a pop. Pt locates pain to all over knee and pain radiates into lower leg. Pt c/o popping that started "not long ago." Pt reports having done PT prior and it being very beneficial.  Radiating pain: yes R knee swelling: yes Aggravates: getting in/out of car, transitioning from sit-to-stand Rx tried: none  Dx imaging: 07/19/20 R & L knee XR          05/05/20 L-spine MRI  Past medical history, Surgical history, Family history, Social history, Allergies, and medications have been entered into the medical record, reviewed.   Review of Systems: No new headache, visual changes, nausea, vomiting, diarrhea, constipation, dizziness, abdominal pain, skin rash, fevers, chills, night sweats, weight loss, swollen lymph nodes, body aches, joint swelling, muscle aches, chest pain, shortness of breath, mood changes, visual or auditory hallucinations.   Objective:    Vitals:   10/09/20 1308  BP: 110/64  Pulse: 68  SpO2: 98%   General: Well Developed, well nourished, and in no acute distress.  Neuro/Psych: Alert and oriented x3, extra-ocular muscles intact, able to move all 4 extremities, sensation grossly intact. Skin: Warm and dry, no rashes noted.  Respiratory: Not using accessory muscles, speaking in full sentences, trachea midline.  Cardiovascular: Pulses palpable, no extremity edema. Abdomen: Does not appear distended. MSK: Right knee mild effusion and bossing tibia medially otherwise  normal. Normal motion with crepitation. Intact strength. Stable ligamentous exam.   Lab and Radiology Results  X-ray images right knee obtained at orthopedic office September 2021 personally independently interpreted showing significant DJD.  Procedure: Real-time Ultrasound Guided Injection of right knee superior lateral patellar space Device: Philips Affiniti 50G Images permanently stored and available for review in PACS Ultrasound examination prior to injection reveals a small effusion and severe lateral compartment DJD with extruded lateral meniscus. Verbal informed consent obtained.  Discussed risks and benefits of procedure. Warned about infection bleeding damage to structures skin hypopigmentation and fat atrophy among others. Patient expresses understanding and agreement Time-out conducted.   Noted no overlying erythema, induration, or other signs of local infection.   Skin prepped in a sterile fashion.   Local anesthesia: Topical Ethyl chloride.   With sterile technique and under real time ultrasound guidance:  40 mg of Kenalog and 2 mL of Marcaine injected into right knee joint. Fluid seen entering the joint capsule.   Completed without difficulty   Pain immediately resolved suggesting accurate placement of the medication.   Advised to call if fevers/chills, erythema, induration, drainage, or persistent bleeding.   Images permanently stored and available for review in the ultrasound unit.  Impression: Technically successful ultrasound guided injection.       Impression and Recommendations:    Assessment and Plan: 86 y.o. female with right knee pain ongoing for few months after failing typical conservative management including home exercise program and Voltaren gel.  Pain thought to be due to exacerbation of DJD.  Plan for trial of steroid  injection today.  Check back as needed in the future if this should not last very long.  Next step would be likely hyaluronic acid  injection.  PDMP not reviewed this encounter. Orders Placed This Encounter  Procedures   [place] LIMITED JOINT SPACE STRUCTURES LOW RIGHT(NO LINKED CHARGES)    Standing Status:   Future    Number of Occurrences:   1    Standing Expiration Date:   04/09/2021    Order Specific Question:   Reason for Exam (SYMPTOM  OR DIAGNOSIS REQUIRED)    Answer:   chronic right knee pain    Order Specific Question:   Preferred imaging location?    Answer:   [place]   No orders of the defined types were placed in this encounter.   Discussed warning signs or symptoms. Please see discharge instructions. Patient expresses understanding.   The above documentation has been reviewed and is accurate and complete [name], M.D.

## 2020-10-09 ENCOUNTER — Ambulatory Visit: Payer: Medicare Other | Admitting: Family Medicine

## 2020-10-09 ENCOUNTER — Encounter: Payer: Self-pay | Admitting: Family Medicine

## 2020-10-09 ENCOUNTER — Other Ambulatory Visit: Payer: Self-pay

## 2020-10-09 ENCOUNTER — Ambulatory Visit: Payer: Self-pay

## 2020-10-09 VITALS — BP 110/64 | HR 68 | Ht 65.0 in | Wt 153.8 lb

## 2020-10-09 DIAGNOSIS — G8929 Other chronic pain: Secondary | ICD-10-CM | POA: Diagnosis not present

## 2020-10-09 DIAGNOSIS — M25561 Pain in right knee: Secondary | ICD-10-CM | POA: Diagnosis not present

## 2020-10-09 NOTE — Patient Instructions (Signed)
Thank you for coming in today.   Call or go to the ER if you develop a large red swollen joint with extreme pain or oozing puss.    Return as needed.  

## 2020-10-12 ENCOUNTER — Encounter: Payer: Medicare Other | Admitting: Family Medicine

## 2020-11-07 ENCOUNTER — Telehealth: Payer: Self-pay

## 2020-11-07 NOTE — Telephone Encounter (Signed)
Patient called in very upset, wanting to speak with Dr.Hunter about her life issues.

## 2020-11-07 NOTE — Telephone Encounter (Signed)
This is just an FYI:  I spoke with patient right after Danielle.  I asked if patient was afraid for her life in regard to her husband.  Patient states that she was not afraid for her life.    Patient stated that she was very upset with her husband b/c she felt like he was spreading lies to Dr. Yong Channel and all over town.  Patient states she is leaving her husband.  States she is upset with him due to previous cheating.   Patient did not want to schedule an appointment with Dr. Yong Channel.    Patient stated that she might be finding another healthcare provider.

## 2020-11-07 NOTE — Telephone Encounter (Signed)
Please schedule virtual or OV in order to speak with Dr. Yong Channel.

## 2020-11-08 NOTE — Telephone Encounter (Signed)
FYI

## 2020-11-08 NOTE — Telephone Encounter (Signed)
Attempted to call patient back.  I am not sure what patient is referencing in regards to Lies-husband simply told me they had moved into different homes and he is hoping they can improve on their situation together.  I am glad patient feels safe-that is the most important thing
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
## 2020-12-27 ENCOUNTER — Other Ambulatory Visit: Payer: Self-pay | Admitting: Family Medicine

## 2020-12-27 ENCOUNTER — Other Ambulatory Visit: Payer: Self-pay

## 2020-12-27 MED ORDER — FLECAINIDE ACETATE 100 MG PO TABS: ORAL_TABLET | ORAL | 0 refills | Status: DC

## 2021-01-03 ENCOUNTER — Other Ambulatory Visit: Payer: Self-pay

## 2021-01-03 MED ORDER — APIXABAN 5 MG PO TABS: ORAL_TABLET | ORAL | 0 refills | Status: DC

## 2021-01-03 NOTE — Telephone Encounter (Signed)
Pt last saw Dr Acie Fredrickson 12/15/19, overdue for follow-up.  Will send message to scheduler to schedule follow-up.  Last labs 12/12/19 Creat 1.35, overdue for labwork as well.  Age 87, weight 69.8kg, pt needs follow-up and labwork. Will await appt to be scheduled to refill rx.

## 2021-01-03 NOTE — Telephone Encounter (Signed)
Pt was called and scheduled a follow-up appt with Dr Acie Fredrickson on 01/08/21.  Called spoke with pt, she states she is completely out of Eliquis will send in rx for 30 day supply and check labwork at OV to ensure pt is on appropriate dosage.

## 2021-01-07 ENCOUNTER — Encounter: Payer: Self-pay | Admitting: Cardiovascular Disease

## 2021-01-07 NOTE — Progress Notes (Signed)
This encounter was created in error - please disregard.

## 2021-01-08 ENCOUNTER — Encounter: Payer: Medicare Other | Admitting: Cardiovascular Disease

## 2021-01-18 ENCOUNTER — Ambulatory Visit: Payer: Medicare Other | Admitting: Neurology

## 2021-01-18 ENCOUNTER — Telehealth: Payer: Self-pay | Admitting: Neurology

## 2021-01-18 NOTE — Telephone Encounter (Signed)
This patient canceled the same day of the revisit appointment today.

## 2021-01-25 NOTE — Patient Instructions (Addendum)
Please stop by lab before you go If you have mychart- we will send your results within 3 business days of [place] receiving them.  If you do not have mychart- we will call you about results within 5 business days of [place] receiving them.  *please also note that you will see labs on mychart as soon as they post. I will later go in and write notes on them- will say "notes from Dr. Yong Channel"  Glad you are feeling better  No changes today unless labs lead [place] to make changes   Health Maintenance Due  Topic Date Due  . COVID-19 Vaccine #3- declines for now 01/21/2020

## 2021-01-25 NOTE — Progress Notes (Signed)
Phone [phone]   Subjective:  Patient presents today for their annual physical. Chief complaint-noted.   See problem oriented charting- ROS- full  review of systems was completed and negative except for: congestion, post nasal drip, ear discharge, light sensitivity, generalized weakness  The following were reviewed and entered/updated in epic: Past Medical History:  Diagnosis Date  . Atrial fibrillation ([place])    persistent  . Colon polyp 2005  . Depression   . DJD (degenerative joint disease)   . Dysrhythmia   . Esophageal stricture   . Family history of breast cancer   . Family history of esophageal cancer   . Family history of prostate cancer   . Gait abnormality 07/07/2020  . GERD (gastroesophageal reflux disease)   . H. pylori infection   . Hiatal hernia   . Hyperlipidemia   . Lumbar spinal stenosis 07/07/2020   L4-5  . Macular degeneration   . Nontoxic multinodular goiter   . OA (osteoarthritis)   . Postherpetic neuralgia at T3-T5 level 04/27/2011  . Rectal bleeding 07/06/2014   Hemorrhoid related in past.     Patient Active Problem List   Diagnosis Date Noted  . 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, estrogen receptor positive ([place]) 06/03/2018    Priority: High  . Atrial fibrillation 06/14/2011    Priority: High  . Insomnia 08/25/2015    Priority: Medium  . Spinal stenosis in cervical region 05/29/2015    Priority: Medium  . Hyperglycemia 06/07/2014    Priority: Medium  . Depression 01/30/2010    Priority: Medium  . Hyperlipidemia 01/28/2008    Priority: Medium  . Cervical lymphadenopathy 05/16/2016    Priority: Low  . History of Helicobacter pylori infection 07/06/2014    Priority: Low  . OSA (obstructive sleep apnea) 02/11/2012    Priority: Low  . Long term (current) use of anticoagulants 07/19/2011    Priority: Low  . Chest pain 06/14/2011    Priority: Low  . History of colonic polyps 01/30/2010    Priority: Low   . ESOPHAGEAL STRICTURE 07/20/2008    Priority: Low  . Osteoarthritis 07/20/2008    Priority: Low  . Multiple thyroid nodules 01/28/2008    Priority: Low  . GERD 01/28/2008    Priority: Low  . Lumbar spinal stenosis 07/07/2020  . Gait abnormality 07/07/2020  . Acquired thrombophilia ([place]) 09/01/2019  . Polyarthralgia 07/15/2019  . Osteoporosis 10/30/2018  . Genetic testing 07/02/2018  . Dyspnea on exertion 03/23/2018  . Constipation 08/29/2017   Past Surgical History:  Procedure Laterality Date  . ANTERIOR CERVICAL DECOMP/DISCECTOMY FUSION N/A 05/29/2015   Procedure: ANTERIOR CERVICAL DECOMPRESSION/DISCECTOMY FUSION CERVICAL FOUR-FIVE,CERVICAL FIVE-SIX;  Surgeon: Kary Kos, MD;  Location: [facility];  Service: Neurosurgery;  Laterality: N/A;  . APPENDECTOMY    . BREAST LUMPECTOMY Left 06/2018  . BREAST LUMPECTOMY WITH RADIOACTIVE SEED LOCALIZATION Left 06/25/2018   Procedure: BREAST LUMPECTOMY WITH RADIOACTIVE SEED LOCALIZATION;  Surgeon: Erroll Luna, MD;  Location: [facility];  Service: General;  Laterality: Left;  . CARDIAC CATHETERIZATION    . CARDIOVERSION  08/29/2011   Procedure: CARDIOVERSION;  Surgeon: Bing Quarry, MD;  Location: [facility];  Service: Cardiovascular;  Laterality: N/A;  . CARDIOVERSION  11/22/2011   Procedure: CARDIOVERSION;  Surgeon: Coralyn Mark, MD;  Location: [facility];  Service: Cardiovascular;  Laterality: N/A;  . CHOLECYSTECTOMY  1989  . COLONOSCOPY W/ POLYPECTOMY  2005   Neg in 2010; Dr Olevia Perches  . ESOPHAGEAL DILATION     >  3 X; Dr Olevia Perches  . LEFT AND RIGHT HEART CATHETERIZATION WITH CORONARY ANGIOGRAM N/A 12/02/2011   Procedure: LEFT AND RIGHT HEART CATHETERIZATION WITH CORONARY ANGIOGRAM;  Surgeon: Jolaine Artist, MD;  Location: [facility];  Service: Cardiovascular;  Laterality: N/A;  . TOTAL ABDOMINAL HYSTERECTOMY  1972   for pain (no BSO)  . TUBAL LIGATION     with appendectomy  . UPPER GI ENDOSCOPY  2010   H pylori    Family  History  Problem Relation Age of Onset  . Heart failure Mother   . Coronary artery disease Mother   . Diabetes Mother   . Osteoarthritis Father   . Coronary artery disease Father   . Prostate cancer Father        in 82s  . Breast cancer Sister        in 72's  . Diabetes Sister   . Esophageal cancer Brother        smoked  . Breast cancer Maternal Aunt     Medications- reviewed and updated Current Outpatient Medications  Medication Sig Dispense Refill  . allopurinol (ZYLOPRIM) 100 MG tablet allopurinol 100 mg tablet    . apixaban (ELIQUIS) 5 MG TABS tablet TAKE 1 TABLET(5 MG) BY MOUTH TWICE DAILY.  Overdue for follow-up, MUST see MD for FUTURE refills. 60 tablet 0  . DULoxetine (CYMBALTA) 30 MG capsule TAKE  2 CAPSULES BY MOUTH EVERY DAY 60 capsule 3  . flecainide (TAMBOCOR) 100 MG tablet TAKE 1 TABLET(100 MG) BY MOUTH TWICE DAILY. Please make overdue appt with Dr. Acie Fredrickson before anymore refills. Thank you 1st attempt 60 tablet 0  . furosemide (LASIX) 20 MG tablet TAKE 1 TABLET(20 MG) BY MOUTH DAILY 90 tablet 1  . gabapentin (NEURONTIN) 100 MG capsule Take 2 capsules (200 mg total) by mouth at bedtime. 180 capsule 3  . metoprolol tartrate (LOPRESSOR) 25 MG tablet Take 1 tablet (25 mg total) by mouth 2 (two) times daily. 60 tablet 5  . Multiple Vitamins-Minerals (PRESERVISION AREDS 2+MULTI VIT PO) Take by mouth daily.    Marland Kitchen omeprazole (PRILOSEC) 40 MG capsule Take 1 capsule (40 mg total) by mouth daily.    Marland Kitchen tiZANidine (ZANAFLEX) 4 MG tablet tizanidine 4 mg tablet  TK 1 T PO BID PRN    . colchicine 0.6 MG tablet colchicine 0.6 mg tablet (Patient not taking: Reported on 01/29/2021)    . traMADol (ULTRAM) 50 MG tablet tramadol 50 mg tablet (Patient not taking: Reported on 01/29/2021)     No current facility-administered medications for this visit.    Allergies-reviewed and updated Allergies  Allergen Reactions  . Penicillins Rash  . Codeine Other (See Comments)     hallucinations hallucinations  . Acetaminophen Other (See Comments)    hurts stomach hurts stomach  . Dabigatran Etexilate Mesylate Other (See Comments)     All extremities feel heavy and hurt  . Dabigatran Etexilate Mesylate Other (See Comments)     All extremities feel heavy and hurt  . Sulfa Antibiotics Rash  . Sulfonamide Derivatives Rash    Social History   Social History Narrative   Married 1956. 4 children 2 boys 2 girls. 16 grandkids.  5 greatgrandkids.    Pt lives in [place] with spouse.        Retired from [employer]      Hobbies: travel, spend time with people, family time   Exercise-walking      No HCPOA-advised to do this.    Objective  Objective:  BP 120/72   Pulse (!) 57   Temp (!) 96.3 F (35.7 C) (Temporal)   Ht 5\' 5"  (1.651 m)   Wt 154 lb 12.8 oz (70.2 kg)   SpO2 97%   BMI 25.76 kg/m  Gen: NAD, resting comfortably HEENT: Mucous membranes are moist. Oropharynx normal Neck: no new thyromegaly CV: RRR no murmurs rubs or gallops Lungs: CTAB no crackles, wheeze, rhonchi Abdomen: soft/nontender/nondistended/normal bowel sounds. No rebound or guarding.  Ext: no edema Skin: warm, dry Neuro: grossly normal, moves all extremities, PERRLA    Assessment and Plan   87 y.o. female presenting for annual physical.  Health Maintenance counseling: 1. Anticipatory guidance: Patient counseled regarding regular dental exams q6 months, eye exams - yearly,  avoiding smoking and second hand smoke , limiting alcohol to 1 beverage per day, no illicit drugs .   2. Risk factor reduction:  Advised patient of need for regular exercise and diet rich and fruits and vegetables to reduce risk of heart attack and stroke. Exercise- limited due to mobility concerns- discussed seated hand weights- also could do some tv exercises as long as safe f for her age. Diet-reasonably healthy diet.  Wt Readings from Last 3 Encounters:  01/29/21 154 lb 12.8 oz (70.2 kg)   10/09/20 153 lb 12.8 oz (69.8 kg)  09/25/20 152 lb 12.8 oz (69.3 kg)  3. Immunizations/screenings/ancillary studies- recommended 3rd booster (declines for now) Immunization History  Administered Date(s) Administered  . Fluad Quad(high Dose 65+) 06/18/2019  . Influenza Whole 08/21/2012  . Influenza, High Dose Seasonal PF 08/21/2013  . Influenza-Unspecified 08/04/2014, 07/22/2015, 07/05/2016, 07/08/2018, 07/10/2020  . PFIZER(Purple Top)SARS-COV-2 Vaccination 11/29/2019, 12/24/2019  . Pneumococcal Conjugate-13 09/30/2014  . Pneumococcal Polysaccharide-23 11/05/2016  . Tdap 07/02/2011  . Zoster 09/08/2013  4. Cervical cancer screening- thinks over 20 years ago had abnormal pap but follow up normal 5. Breast cancer history-patient follows with Dr. Lindi Adie after prior breast cancer. Reports taking letrozole still (do not see on med list) 6. Colon cancer surveillance- patient with colonoscopy in 2015 and adenomas noted.  Has opted out of repeat at age [age]  but per GI letter in 2018- "colonoscopy is now NOT recommended"  7. Skin cancer screening- has sensitive spot on right side of nose- going ot schedule dermatology appointment. advised regular sunscreen use. Denies worrisome, changing, or new skin lesions.  8. Birth control/STD check- not sexually active 9. Osteoporosis screening at 65- myalgias in 2019 despite elevated hip fracture score above 3%-last bone density 2019 . Declines bone density at this tim -Never smoker  Status of chronic or acute concerns   #Possible dementia- neuro refer pending at last visit-unfortunately patient no showed this appointment . She and her husband no longer living in same home- she states this has been very helpful for her. Seems much happier today- less tearful, irritable, anxious  #Chronic diffuse pain/also with spinal stenosis in cervical region #Depression - patient remains on cymbalta 60 mg daily. Also on gabapentin 200mg  at bedtime.  Depression screen  [facility] 2/9 01/29/2021 01/29/2021 09/25/2020  Decreased Interest 0 0 0  Down, Depressed, Hopeless 0 0 0  PHQ - 2 Score 0 0 0  Altered sleeping 0 - 0  Tired, decreased energy 0 - 0  Change in appetite 0 - 0  Feeling bad or failure about yourself  0 - 0  Trouble concentrating 0 - 0  Moving slowly or fidgety/restless 0 - 0  Suicidal thoughts 0 - 0  PHQ-9 Score 0 -  0  Difficult doing work/chores Not difficult at all - -  Some recent data might be hidden  - phq9 down at 0 will consider full remission  # Atrial fibrillation S: Remains on flecainide as well as metoprolol 12.5 mg BID- slightly bradycardic even with this Anticoagulated with eliquis 5 mg BID A/P: Stable. Continue current medications.    #Essential tremor-we also increase metoprolol last visit to see if that would help with her essential tremor-today she reports this was helpful for tremor but was felling tired and went back to a half tablet   #Patient on Lasix 20 mg daily through cardiology in the past-possible diastolic CHF.  Based on prior refill would be out of medicine but without signs of fluid overload-continue to monitor and instructed patient to monitor. She reports taking daily- sh ewill double check at hom-e could have been phoned in  #Gout S: 0 flares on allopurinol 100mg - shes not sure shes taking this Lab Results  Component Value Date   LABURIC 9.0 (H) 09/21/2019  A/P:with no gout flares recently- will check uric acid but likely not press for medicine   #hyperlipidemia S: Medication: none Lab Results  Component Value Date   CHOL 214 (H) 11/01/2016   HDL 53.50 11/01/2016   LDLCALC 127 (H) 11/01/2016   TRIG 164.0 (H) 11/01/2016   CHOLHDL 4 11/01/2016   A/P: Poor control in the past with past primary prevention age group- update today  # Hyperglycemia/insulin resistance/prediabetes S:  Medication: none Exercise and diet-  Doesn't feel steady for walking, trying to eat reasonably healthy Lab Results  Component  Value Date   HGBA1C 5.8 11/01/2016   HGBA1C 5.7 05/09/2015   HGBA1C 6.0 06/07/2014   A/P: hopefully stable- update a1c today  # GERD S:Medication: omeprazole 40 mg reportedly from another physician  A/P: I am not refilling- we will monitor at present  Recommended follow up: Return in about 6 months (around 07/31/2021) for follow up- or sooner if needed. Future Appointments  Date Time Provider [place]  04/12/2021 12:45 PM [name], NP [facility] None  04/27/2021 10:30 AM [name], MD [facility] None   Lab/Order associations:NOT fasting- pepsi but no food   ICD-10-CM   1. Preventative health care  Z00.00 CBC with Differential/Platelet    Comprehensive metabolic panel    Lipid panel    Uric acid    Hemoglobin A1c  2. Gastroesophageal reflux disease, unspecified whether esophagitis present  K21.9   3. Hyperlipidemia, unspecified hyperlipidemia type  E78.5 CBC with Differential/Platelet    Comprehensive metabolic panel    Lipid panel  4. Hyperglycemia  R73.9 Hemoglobin A1c  5. Recurrent major depressive disorder, in partial remission ([place])  F33.41   6. 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, estrogen receptor positive ([place])  C50.512    Z17.0   7. Paroxysmal atrial fibrillation ([facility])  I48.0   8. Spinal stenosis in cervical region  M48.02   9. Gout, unspecified cause, unspecified chronicity, unspecified site  M10.9 Uric acid    No orders of the defined types were placed in this encounter.   Return precautions advised.  Garret Reddish, MD

## 2021-01-29 ENCOUNTER — Encounter: Payer: Self-pay | Admitting: Family Medicine

## 2021-01-29 ENCOUNTER — Other Ambulatory Visit: Payer: Self-pay

## 2021-01-29 ENCOUNTER — Ambulatory Visit (INDEPENDENT_AMBULATORY_CARE_PROVIDER_SITE_OTHER): Payer: Medicare Other | Admitting: Family Medicine

## 2021-01-29 VITALS — BP 120/72 | HR 57 | Temp 96.3°F | Ht 65.0 in | Wt 154.8 lb

## 2021-01-29 DIAGNOSIS — Z0000 Encounter for general adult medical examination without abnormal findings: Secondary | ICD-10-CM

## 2021-01-29 DIAGNOSIS — R739 Hyperglycemia, unspecified: Secondary | ICD-10-CM

## 2021-01-29 DIAGNOSIS — C50512 Malignant neoplasm of lower-outer quadrant of left female breast: Secondary | ICD-10-CM

## 2021-01-29 DIAGNOSIS — K219 Gastro-esophageal reflux disease without esophagitis: Secondary | ICD-10-CM | POA: Diagnosis not present

## 2021-01-29 DIAGNOSIS — I480 Paroxysmal atrial fibrillation: Secondary | ICD-10-CM

## 2021-01-29 DIAGNOSIS — E785 Hyperlipidemia, unspecified: Secondary | ICD-10-CM | POA: Diagnosis not present

## 2021-01-29 DIAGNOSIS — M109 Gout, unspecified: Secondary | ICD-10-CM | POA: Diagnosis not present

## 2021-01-29 DIAGNOSIS — Z170 Estrogen receptor positive status [ER+]: Secondary | ICD-10-CM

## 2021-01-29 DIAGNOSIS — F3341 Major depressive disorder, recurrent, in partial remission: Secondary | ICD-10-CM

## 2021-01-29 DIAGNOSIS — M4802 Spinal stenosis, cervical region: Secondary | ICD-10-CM

## 2021-01-29 DIAGNOSIS — D6869 Other thrombophilia: Secondary | ICD-10-CM

## 2021-01-30 LAB — HEMOGLOBIN A1C: Hgb A1c MFr Bld: 5.9 % (ref 4.6–6.5)

## 2021-01-30 LAB — COMPREHENSIVE METABOLIC PANEL
ALT: 13 U/L (ref 0–35)
AST: 17 U/L (ref 0–37)
Albumin: 3.9 g/dL (ref 3.5–5.2)
Alkaline Phosphatase: 76 U/L (ref 39–117)
BUN: 26 mg/dL — ABNORMAL HIGH (ref 6–23)
CO2: 28 mEq/L (ref 19–32)
Calcium: 9.5 mg/dL (ref 8.4–10.5)
Chloride: 102 mEq/L (ref 96–112)
Creatinine, Ser: 1.31 mg/dL — ABNORMAL HIGH (ref 0.40–1.20)
GFR: 37.09 mL/min — ABNORMAL LOW (ref >60.00)
Glucose, Bld: 88 mg/dL (ref 70–99)
Potassium: 4.6 mEq/L (ref 3.5–5.1)
Sodium: 139 mEq/L (ref 135–145)
Total Bilirubin: 0.7 mg/dL (ref 0.2–1.2)
Total Protein: 6.6 g/dL (ref 6.0–8.3)

## 2021-01-30 LAB — CBC WITH DIFFERENTIAL/PLATELET
Basophils Absolute: 0.1 10*3/uL (ref 0.0–0.1)
Basophils Relative: 1.5 % (ref 0.0–3.0)
Eosinophils Absolute: 0.2 10*3/uL (ref 0.0–0.7)
Eosinophils Relative: 2 % (ref 0.0–5.0)
HCT: 37.2 % (ref 36.0–46.0)
Hemoglobin: 12.4 g/dL (ref 12.0–15.0)
Lymphocytes Relative: 28.1 % (ref 12.0–46.0)
Lymphs Abs: 2.2 10*3/uL (ref 0.7–4.0)
MCHC: 33.3 g/dL (ref 30.0–36.0)
MCV: 87.6 fl (ref 78.0–100.0)
Monocytes Absolute: 0.5 10*3/uL (ref 0.1–1.0)
Monocytes Relative: 6.5 % (ref 3.0–12.0)
Neutro Abs: 4.9 10*3/uL (ref 1.4–7.7)
Neutrophils Relative %: 61.9 % (ref 43.0–77.0)
Platelets: 313 10*3/uL (ref 150.0–400.0)
RBC: 4.24 Mil/uL (ref 3.87–5.11)
RDW: 13.7 % (ref 11.5–15.5)
WBC: 7.9 10*3/uL (ref 4.0–10.5)

## 2021-01-30 LAB — LIPID PANEL
Cholesterol: 195 mg/dL (ref 0–200)
HDL: 56.2 mg/dL (ref >39.00)
LDL Cholesterol: 101 mg/dL — ABNORMAL HIGH (ref 0–99)
NonHDL: 138.67
Total CHOL/HDL Ratio: 3
Triglycerides: 190 mg/dL — ABNORMAL HIGH (ref 0.0–149.0)
VLDL: 38 mg/dL (ref 0.0–40.0)

## 2021-01-30 LAB — URIC ACID: Uric Acid, Serum: 7.2 mg/dL — ABNORMAL HIGH (ref 2.4–7.0)

## 2021-02-01 DIAGNOSIS — H43393 Other vitreous opacities, bilateral: Secondary | ICD-10-CM | POA: Diagnosis not present

## 2021-02-01 DIAGNOSIS — H353112 Nonexudative age-related macular degeneration, right eye, intermediate dry stage: Secondary | ICD-10-CM | POA: Diagnosis not present

## 2021-02-01 DIAGNOSIS — H353124 Nonexudative age-related macular degeneration, left eye, advanced atrophic with subfoveal involvement: Secondary | ICD-10-CM | POA: Diagnosis not present

## 2021-02-01 DIAGNOSIS — H43813 Vitreous degeneration, bilateral: Secondary | ICD-10-CM | POA: Diagnosis not present

## 2021-02-02 ENCOUNTER — Other Ambulatory Visit: Payer: Self-pay | Admitting: Cardiovascular Disease

## 2021-02-07 NOTE — Telephone Encounter (Signed)
Prescription refill request for Eliquis received. Indication: afib  Last office visit: nahser, 12/15/2019 Scr: 1.31, 01/29/2021 Age: 87 yo  Weight: 70.2 kg   Pt is overdue for an office visit, message sent to scheduler,

## 2021-02-07 NOTE — Telephone Encounter (Signed)
Pt scheduled to see Dr Acie Fredrickson 05/02/2021.   Pt is on the correct dose of Eliquis per dosing criteria, prescription refill sent for Eliquis 5mg  BID.

## 2021-02-14 ENCOUNTER — Ambulatory Visit: Payer: Self-pay

## 2021-02-14 ENCOUNTER — Other Ambulatory Visit: Payer: Self-pay

## 2021-02-14 ENCOUNTER — Ambulatory Visit (INDEPENDENT_AMBULATORY_CARE_PROVIDER_SITE_OTHER): Payer: Medicare Other | Admitting: Family Medicine

## 2021-02-14 VITALS — BP 118/78 | HR 52 | Ht 65.0 in | Wt 153.4 lb

## 2021-02-14 DIAGNOSIS — M25561 Pain in right knee: Secondary | ICD-10-CM | POA: Diagnosis not present

## 2021-02-14 DIAGNOSIS — G8929 Other chronic pain: Secondary | ICD-10-CM | POA: Diagnosis not present

## 2021-02-14 NOTE — Progress Notes (Signed)
I, Peterson Lombard, LAT, ATC acting as a scribe for [name], MD.  Bridget Lane is a 87 y.o. female who presents to [place] [facility] today for cont chronic R knee pain. Pt has previously been seen by orthopedics and neurology for knee pain. Pt was last seen by Dr. Georgina Snell on 10/09/20 and was given a R knee steroid injection. Today, pt reports has had total relief from prior steroid injection w/ pain returning in the last 1-2 weeks. Pt c/o knee pain waking her up at night.  Dx imaging: 07/19/20 R & L knee XR                     05/05/20 L-spine MRI  Pertinent review of systems: No fevers or chills  Relevant historical information: Sleep apnea   Exam:  BP 118/78 (BP Location: Right Arm, Patient Position: Sitting, Cuff Size: Normal)   Pulse (!) 52   Ht 5\' 5"  (1.651 m)   Wt 153 lb 6.4 oz (69.6 kg)   SpO2 97%   BMI 25.53 kg/m  General: Well Developed, well nourished, and in no acute distress.   MSK: Right knee mild effusion normal motion with crepitation.  Tender palpation medial joint line.    Lab and Radiology Results  Procedure: Real-time Ultrasound Guided Injection of right knee superior lateral patellar space Device: Philips Affiniti 50G Images permanently stored and available for review in PACS Verbal informed consent obtained.  Discussed risks and benefits of procedure. Warned about infection bleeding damage to structures skin hypopigmentation and fat atrophy among others. Patient expresses understanding and agreement Time-out conducted.   Noted no overlying erythema, induration, or other signs of local infection.   Skin prepped in a sterile fashion.   Local anesthesia: Topical Ethyl chloride.   With sterile technique and under real time ultrasound guidance:  40 mg of Kenalog and 2 mL of Marcaine injected into knee joint. Fluid seen entering the joint capsule.   Completed without difficulty   Pain immediately resolved suggesting accurate  placement of the medication.   Advised to call if fevers/chills, erythema, induration, drainage, or persistent bleeding.   Images permanently stored and available for review in the ultrasound unit.  Impression: Technically successful ultrasound guided injection.     Assessment and Plan: 87 y.o. female with right knee pain due to DJD.  Plan for repeat steroid injection.  Patient had great benefit from steroid injection previously.  Discussed treatment plan and options going forward if the steroid injections do not last could proceed with gel shots.  Ultimately may need knee replacement however 87 years old we will try our best to avoid that if possible.   PDMP not reviewed this encounter. Orders Placed This Encounter  Procedures  . [place] LIMITED JOINT SPACE STRUCTURES LOW RIGHT(NO LINKED CHARGES)    Standing Status:   Future    Number of Occurrences:   1    Standing Expiration Date:   08/16/2021    Order Specific Question:   Reason for Exam (SYMPTOM  OR DIAGNOSIS REQUIRED)    Answer:   chronic right knee pain    Order Specific Question:   Preferred imaging location?    Answer:   [facility]   No orders of the defined types were placed in this encounter.    Discussed warning signs or symptoms. Please see discharge instructions. Patient expresses understanding.   The above documentation has been reviewed and is accurate and  complete [name], M.D.

## 2021-02-14 NOTE — Patient Instructions (Signed)
Thank you for coming in today.  Call or go to the ER if you develop a large red swollen joint with extreme pain or oozing puss.   Recheck as needed.   We can do this shot up to every 3 moths but less is more.

## 2021-02-28 ENCOUNTER — Other Ambulatory Visit: Payer: Self-pay | Admitting: Cardiovascular Disease

## 2021-02-28 ENCOUNTER — Other Ambulatory Visit: Payer: Self-pay | Admitting: Family Medicine

## 2021-03-08 ENCOUNTER — Telehealth: Payer: Self-pay | Admitting: Cardiovascular Disease

## 2021-03-23 DIAGNOSIS — L218 Other seborrheic dermatitis: Secondary | ICD-10-CM | POA: Diagnosis not present

## 2021-03-23 DIAGNOSIS — Z1283 Encounter for screening for malignant neoplasm of skin: Secondary | ICD-10-CM | POA: Diagnosis not present

## 2021-03-23 DIAGNOSIS — Z08 Encounter for follow-up examination after completed treatment for malignant neoplasm: Secondary | ICD-10-CM | POA: Diagnosis not present

## 2021-03-23 DIAGNOSIS — Z85828 Personal history of other malignant neoplasm of skin: Secondary | ICD-10-CM | POA: Diagnosis not present

## 2021-03-24 ENCOUNTER — Other Ambulatory Visit: Payer: Self-pay | Admitting: Cardiovascular Disease

## 2021-03-26 ENCOUNTER — Telehealth: Payer: Self-pay | Admitting: Cardiovascular Disease

## 2021-03-26 ENCOUNTER — Other Ambulatory Visit: Payer: Self-pay

## 2021-03-26 ENCOUNTER — Emergency Department (HOSPITAL_BASED_OUTPATIENT_CLINIC_OR_DEPARTMENT_OTHER)
Admission: EM | Admit: 2021-03-26 | Discharge: 2021-03-26 | Disposition: A | Discharge status: Home / Self Care | Payer: Medicare Other | Attending: Emergency Medicine | Admitting: Emergency Medicine

## 2021-03-26 DIAGNOSIS — Z7901 Long term (current) use of anticoagulants: Secondary | ICD-10-CM | POA: Insufficient documentation

## 2021-03-26 DIAGNOSIS — S90862A Insect bite (nonvenomous), left foot, initial encounter: Secondary | ICD-10-CM | POA: Insufficient documentation

## 2021-03-26 DIAGNOSIS — S90861A Insect bite (nonvenomous), right foot, initial encounter: Secondary | ICD-10-CM | POA: Diagnosis not present

## 2021-03-26 DIAGNOSIS — W57XXXA Bitten or stung by nonvenomous insect and other nonvenomous arthropods, initial encounter: Secondary | ICD-10-CM | POA: Insufficient documentation

## 2021-03-26 DIAGNOSIS — Z853 Personal history of malignant neoplasm of breast: Secondary | ICD-10-CM | POA: Diagnosis not present

## 2021-03-26 DIAGNOSIS — S99921A Unspecified injury of right foot, initial encounter: Secondary | ICD-10-CM | POA: Diagnosis present

## 2021-03-26 DIAGNOSIS — S90869A Insect bite (nonvenomous), unspecified foot, initial encounter: Secondary | ICD-10-CM

## 2021-03-26 MED ORDER — DOXYCYCLINE HYCLATE 100 MG PO CAPS
100.0000 mg | ORAL_CAPSULE | Freq: Two times a day (BID) | ORAL | 0 refills | Status: AC
Start: 2021-03-26 — End: 2021-04-02

## 2021-03-26 MED ORDER — PREDNISONE 10 MG (21) PO TBPK
ORAL_TABLET | Freq: Every day | ORAL | 0 refills | Status: DC
Start: 2021-03-26 — End: 2021-04-12

## 2021-03-26 MED ORDER — DIPHENHYDRAMINE HCL 25 MG PO TABS
25.0000 mg | ORAL_TABLET | Freq: Four times a day (QID) | ORAL | 0 refills | Status: DC | PRN
Start: 2021-03-26 — End: 2021-09-03

## 2021-03-26 NOTE — ED Provider Notes (Signed)
[facility] EMERGENCY DEPT Provider Note   CSN: [id number] Arrival date & time: 03/26/21  1543     History Chief Complaint  Patient presents with  . Insect Bite    Bridget Lane is a 87 y.o. female.  Patient reports that a little over a week ago she had stepped on an ant pile and suffered from several ant bites on the dorsum of her feet.  States that she has subsequently developed some lesions and redness.  Somewhat itchy.  Denies any chills or fevers.  Otherwise feels fine.  No bleeding or aggravating factors identified.  Does not try any medication  HPI     Past Medical History:  Diagnosis Date  . Atrial fibrillation ([place])    persistent  . Colon polyp 2005  . Depression   . DJD (degenerative joint disease)   . Dysrhythmia   . Esophageal stricture   . Family history of breast cancer   . Family history of esophageal cancer   . Family history of prostate cancer   . Gait abnormality 07/07/2020  . GERD (gastroesophageal reflux disease)   . H. pylori infection   . Hiatal hernia   . Hyperlipidemia   . Lumbar spinal stenosis 07/07/2020   L4-5  . Macular degeneration   . Nontoxic multinodular goiter   . OA (osteoarthritis)   . Postherpetic neuralgia at T3-T5 level 04/27/2011  . Rectal bleeding 07/06/2014   Hemorrhoid related in past.      Patient Active Problem List   Diagnosis Date Noted  . Lumbar spinal stenosis 07/07/2020  . Gait abnormality 07/07/2020  . Acquired thrombophilia ([place]) 09/01/2019  . Polyarthralgia 07/15/2019  . Osteoporosis 10/30/2018  . Genetic testing 07/02/2018  . 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, estrogen receptor positive ([place]) 06/03/2018  . Dyspnea on exertion 03/23/2018  . Constipation 08/29/2017  . Cervical lymphadenopathy 05/16/2016  . Insomnia 08/25/2015  . Spinal stenosis in cervical region 05/29/2015  . History of Helicobacter pylori infection 07/06/2014  . Hyperglycemia 06/07/2014  .  OSA (obstructive sleep apnea) 02/11/2012  . Long term (current) use of anticoagulants 07/19/2011  . Atrial fibrillation 06/14/2011  . Chest pain 06/14/2011  . Depression 01/30/2010  . History of colonic polyps 01/30/2010  . ESOPHAGEAL STRICTURE 07/20/2008  . Osteoarthritis 07/20/2008  . Multiple thyroid nodules 01/28/2008  . Hyperlipidemia 01/28/2008  . GERD 01/28/2008    Past Surgical History:  Procedure Laterality Date  . ANTERIOR CERVICAL DECOMP/DISCECTOMY FUSION N/A 05/29/2015   Procedure: ANTERIOR CERVICAL DECOMPRESSION/DISCECTOMY FUSION CERVICAL FOUR-FIVE,CERVICAL FIVE-SIX;  Surgeon: Kary Kos, MD;  Location: [facility];  Service: Neurosurgery;  Laterality: N/A;  . APPENDECTOMY    . BREAST LUMPECTOMY Left 06/2018  . BREAST LUMPECTOMY WITH RADIOACTIVE SEED LOCALIZATION Left 06/25/2018   Procedure: BREAST LUMPECTOMY WITH RADIOACTIVE SEED LOCALIZATION;  Surgeon: Erroll Luna, MD;  Location: [facility];  Service: General;  Laterality: Left;  . CARDIAC CATHETERIZATION    . CARDIOVERSION  08/29/2011   Procedure: CARDIOVERSION;  Surgeon: Bing Quarry, MD;  Location: [facility];  Service: Cardiovascular;  Laterality: N/A;  . CARDIOVERSION  11/22/2011   Procedure: CARDIOVERSION;  Surgeon: Coralyn Mark, MD;  Location: [facility];  Service: Cardiovascular;  Laterality: N/A;  . CHOLECYSTECTOMY  1989  . COLONOSCOPY W/ POLYPECTOMY  2005   Neg in 2010; Dr Olevia Perches  . ESOPHAGEAL DILATION     > 3 X; Dr Olevia Perches  . LEFT AND RIGHT HEART CATHETERIZATION WITH CORONARY ANGIOGRAM N/A  12/02/2011   Procedure: LEFT AND RIGHT HEART CATHETERIZATION WITH CORONARY ANGIOGRAM;  Surgeon: Jolaine Artist, MD;  Location: [facility] C[facility] LAB;  Service: Cardiovascular;  Laterality: N/A;  . TOTAL ABDOMINAL HYSTERECTOMY  1972   for pain (no BSO)  . TUBAL LIGATION     with appendectomy  . UPPER GI ENDOSCOPY  2010   H pylori     OB History   No obstetric history on file.     Family History  Problem Relation  Age of Onset  . Heart failure Mother   . Coronary artery disease Mother   . Diabetes Mother   . Osteoarthritis Father   . Coronary artery disease Father   . Prostate cancer Father        in 8s  . Breast cancer Sister        in 10's  . Diabetes Sister   . Esophageal cancer Brother        smoked  . Breast cancer Maternal Aunt     Social History   Tobacco Use  . Smoking status: Never Smoker  . Smokeless tobacco: Never Used  Vaping Use  . Vaping Use: Never used  Substance Use Topics  . Alcohol use: No  . Drug use: No    Home Medications Prior to Admission medications   Medication Sig Start Date End Date Taking? Authorizing Provider  diphenhydrAMINE (BENADRYL) 25 MG tablet Take 1 tablet (25 mg total) by mouth every 6 (six) hours as needed for itching or allergies. 03/26/21  Yes Lucrezia Starch, MD  doxycycline (VIBRAMYCIN) 100 MG capsule Take 1 capsule (100 mg total) by mouth 2 (two) times daily for 7 days. 03/26/21 04/02/21 Yes Cynthya Yam, Ellwood Dense, MD  predniSONE (STERAPRED UNI-PAK 21 TAB) 10 MG (21) TBPK tablet Take by mouth daily. Take 6 tabs by mouth daily  for 1 day, then 5 tabs for 1 day, then 4 tabs for 1 day, then 3 tabs for 1 day, 2 tabs for 1 day, then 1 tab by mouth daily for 1 day 03/26/21  Yes Lucrezia Starch, MD  allopurinol (ZYLOPRIM) 100 MG tablet allopurinol 100 mg tablet    [provider]  colchicine 0.6 MG tablet colchicine 0.6 mg tablet Patient not taking: Reported on 01/29/2021    [provider]  DULoxetine (CYMBALTA) 30 MG capsule TAKE  2 CAPSULES BY MOUTH EVERY DAY 12/27/20   [name], MD  ELIQUIS 5 MG TABS tablet TAKE 1 TABLET BY MOUTH TWICE DAILY 02/07/21   [name], [name], MD  flecainide (TAMBOCOR) 100 MG tablet Take 1 tablet (100 mg total) by mouth 2 (two) times daily. TAKE 1 TABLET(100 MG) BY MOUTH TWICE DAILY. PLEASE MAKE OVERDUE APPOINTMENT WITH DOCTOR [name] BEFORE ANYMORE REFILLS. [place] YOU FIRST ATTEMPT 03/26/21   [name],  [name], MD  furosemide (LASIX) 20 MG tablet TAKE 1 TABLET(20 MG) BY MOUTH DAILY 03/01/21   [name], [name], MD  gabapentin (NEURONTIN) 100 MG capsule Take 2 capsules (200 mg total) by mouth at bedtime. 09/21/19   Lyndal Pulley, DO  letrozole [facility]) 2.5 MG tablet Take 2.5 mg by mouth daily. From oncology. [facility] team erroneously removed 09/25/20 visit    [provider]  metoprolol tartrate (LOPRESSOR) 25 MG tablet Take 1 tablet (25 mg total) by mouth 2 (two) times daily. 09/25/20   [name], MD  Multiple Vitamins-Minerals (PRESERVISION AREDS 2+MULTI VIT PO) Take by mouth daily.    [provider]  omeprazole (  PRILOSEC) 40 MG capsule TAKE 1 CAPSULE(40 MG) BY MOUTH TWICE DAILY 03/01/21   [name], MD  tiZANidine (ZANAFLEX) 4 MG tablet tizanidine 4 mg tablet  TK 1 T PO BID PRN    [provider]  traMADol (ULTRAM) 50 MG tablet tramadol 50 mg tablet Patient not taking: Reported on 01/29/2021    [provider]    Allergies    Penicillins, Codeine, Acetaminophen, Dabigatran etexilate mesylate, Dabigatran etexilate mesylate, Sulfa antibiotics, and Sulfonamide derivatives  Review of Systems   Review of Systems  Constitutional: Negative for chills and fever.  HENT: Negative for ear pain and sore throat.   Eyes: Negative for pain and visual disturbance.  Respiratory: Negative for cough and shortness of breath.   Cardiovascular: Negative for chest pain and palpitations.  Gastrointestinal: Negative for abdominal pain and vomiting.  Genitourinary: Negative for dysuria and hematuria.  Musculoskeletal: Negative for arthralgias and back pain.  Skin: Positive for rash and wound. Negative for color change.  Neurological: Negative for seizures and syncope.  All other systems reviewed and are negative.   Physical Exam Updated Vital Signs BP 121/87 (BP Location: Left Arm)   Pulse 70   Temp 99.1 F (37.3 C) (Oral)   Resp 14   Ht 5\' 4"  (1.626 m)    Wt 68 kg   SpO2 98%   BMI 25.75 kg/m   Physical Exam Vitals and nursing note reviewed.  Constitutional:      General: She is not in acute distress.    Appearance: She is well-developed.  HENT:     Head: Normocephalic and atraumatic.  Eyes:     Conjunctiva/sclera: Conjunctivae normal.  Cardiovascular:     Rate and Rhythm: Normal rate.     Pulses: Normal pulses.  Pulmonary:     Effort: Pulmonary effort is normal. No respiratory distress.  Musculoskeletal:     Cervical back: Neck supple.  Skin:    Comments: Small raised maculopapular rash over the dorsum of bilateral feet, blanchable, some excoriations present  Neurological:     Mental Status: She is alert.  Psychiatric:        Mood and Affect: Mood normal.        Thought Content: Thought content normal.     ED Results / Procedures / Treatments   Labs (all labs ordered are listed, but only abnormal results are displayed) Labs Reviewed - No data to display  EKG None  Radiology No results found.  Procedures Procedures   Medications Ordered in ED Medications - No data to display  ED Course  I have reviewed the triage vital signs and the nursing notes.  Pertinent labs & imaging results that were available during my care of the patient were reviewed by me and considered in my medical decision making (see chart for details).    MDM Rules/Calculators/A&P                          87 year old lady presents to ER with concern for rash over both feet after being bit by ants.  Suspect most likely this is allergic in nature given the itching and bilateral nature but cannot exclude some mild cellulitis.  Recommend course of steroids and antibiotics.  Recommend she follow-up either with her dermatologist or her primary care doctor for close recheck in the next couple days.  Discharged home in stable condition.  After the discussed management above, the patient was determined to be safe for  discharge.  The patient was in  agreement with this plan and all questions regarding their care were answered.  ED return precautions were discussed and the patient will return to the ED with any significant worsening of condition.    Final Clinical Impression(s) / ED Diagnoses Final diagnoses:  Insect bite of foot, unspecified laterality, initial encounter    Rx / DC Orders ED Discharge Orders         Ordered    predniSONE (STERAPRED UNI-PAK 21 TAB) 10 MG (21) TBPK tablet  Daily        03/26/21 1623    doxycycline (VIBRAMYCIN) 100 MG capsule  2 times daily        03/26/21 1623    diphenhydrAMINE (BENADRYL) 25 MG tablet  Every 6 hours PRN        03/26/21 1624           Lucrezia Starch, MD 03/27/21 1552

## 2021-03-26 NOTE — Discharge Instructions (Addendum)
Please schedule appointment to be seen within the next couple days either with your primary care doctor or your dermatologist.  I recommend taking the course of steroids as well as antibiotics.  For itching recommend taking Benadryl as needed.  You can buy this over-the-counter.  Note it can make you mildly drowsy and should not be taken while driving or operating heavy machinery.    The antibiotic can make your skin more sensitive.  While taking this you should avoid direct sunlight and use lots of sunscreen while outside.  If the redness worsens, you develop other new concerning symptom, return to ER for reassessment.

## 2021-03-26 NOTE — Telephone Encounter (Signed)
Left message to call back  

## 2021-03-26 NOTE — ED Triage Notes (Signed)
Ant bites to bilateral feet from 8 days ago. Itchy.

## 2021-03-26 NOTE — Telephone Encounter (Signed)
    Pt c/o medication issue:  1. Name of Medication:   flecainide (TAMBOCOR) 100 MG tablet    2. How are you currently taking this medication (dosage and times per day)? Take 1 tablet (100 mg total) by mouth 2 (two) times daily. TAKE 1 TABLET(100 MG) BY MOUTH TWICE DAILY.   3. Are you having a reaction (difficulty breathing--STAT)?   4. What is your medication issue? Pt said the pharmacy is asking $145 for her prescription, she said pharmacy gave her a refill before but she said she can't find the bottle, she said she dont think she got any refill yet. She is very upset (crying) because she doesn't have any $145 and she been out of meds for 2 days now

## 2021-03-28 ENCOUNTER — Other Ambulatory Visit: Payer: Self-pay | Admitting: Hematology and Oncology

## 2021-03-29 NOTE — Telephone Encounter (Signed)
**Note De-Identified  Obfuscation** I started a urgent Flecainide tier exception through covermymeds. Key: [id number]  No answer so I left a detailed message on the pts VM advising her that I have started a urgent tier exception on her Flecainide and that if approved this will lower her cost and if it is denied we can discuss her using [facility].

## 2021-03-29 NOTE — Telephone Encounter (Signed)
**Note De-Identified  Obfuscation** The pt called me back and stated that her pharmacy was out of Flecainide but has received some since she called [place] and now she has a 30 day supply of her Flecainide on hand. I advised her that if this happens again to request that her pharmacy transfer her RX to a pharmacy that does have her medication as it is important that she not miss any doses of her Flecainide.  She is aware to call us immediately if this happens again.  She is also aware that I started a Flecainide tier exception and that it has been approved as follows:  Message received from covermymeds: Bridget Lane Key: [id number] Outcome: Approved today Effective from 03/29/2021 through 03/29/2022. Drug: Flecainide Acetate 100MG  tablets Form: [employer] [place] Medicare Part D Tier Exception Form  She thanked me for my help.

## 2021-04-11 NOTE — Progress Notes (Signed)
PATIENT: Bridget Lane DOB: 1933/01/02  REASON FOR VISIT: follow up HISTORY FROM: patient Primary Neurologist: Dr. Jannifer Franklin   Today 04/12/21 Bridget Lane is an 87 year old female with history of lumbar spinal stenosis.  Previously had cervical spine surgery with Dr. Saintclair Halsted.  Going through a lot of change, she is separated from her husband of 91 years.  Now living in her own apartment for 3 months.  2 weeks ago, suffered a fall in the bathroom, 2 nights in a row, both nights took Ambien which is new.  Denies headache, or other neurologic issue.  Bruising to the face noted.  Is on Eliquis with history of A. fib.  Denies significant low back pain, but does have weakness of the right leg.  Uses a cane.  Saw orthopedic, given injections to the right knee.  Did PT, good benefit, help to learn to use a cane, worked on gait and balance.  Still has occasional falls.  Is now interested in possible surgical options.  Does have urinary leakage, difficulty emptying the bladder.  Would like a referral to Dr. Saintclair Halsted.  HISTORY 07/07/2020 Dr. Jannifer Franklin: Bridget Lane is an 87 year old right-handed white female with a history of lumbar spinal stenosis.  The patient claims that 2 years ago she fell coming out of the grocery store and within several days she found that she was not able to walk at all.  She gradually regained her ability to walk, but she has felt that her legs have been weak bilaterally since that time, left greater than right.  The patient has had low back pain for many, many years, this has not changed.  She denies a lot of pain down the legs with walking, she may occasionally have some upper thigh pain on the left.  She is more concerned about bilateral knee discomfort when she first stands up.  She has severe pain in both knees, once she starts walking, the knee pain seems to dissipate.  The patient reports some sensation that she is wearing socks on her feet, she may occasionally have some numbness in the hands.   She has arthritis in the hands and has difficulty with handwriting because of this.  She reports some difficulty with urinary incontinence and difficulty fully voiding the bladder.  She has a history of cervical spinal stenosis, she underwent surgery several years ago by Dr. Saintclair Halsted.  She has a history of atrial fibrillation intermittently, she is on Eliquis for this.  She has been falling with some regularity, the last fall was about a month ago.  She refuses to use a cane.  At times when she walks longer distances, she is limited by episodes of increased heart rate and shortness of breath.  The patient does not wish to consider surgical options for her lumbar spinal stenosis at the L4-5 level.  Recent MRI of the lumbar spine has been done.  She comes to this office for further evaluation.  REVIEW OF SYSTEMS: Out of a complete 14 system review of symptoms, the patient complains only of the following symptoms, and all other reviewed systems are negative.  See HPI  ALLERGIES: Allergies  Allergen Reactions   Penicillins Rash   Codeine Other (See Comments)    hallucinations hallucinations   Acetaminophen Other (See Comments)    hurts stomach hurts stomach   Dabigatran Etexilate Mesylate Other (See Comments)     All extremities feel heavy and hurt   Dabigatran Etexilate Mesylate Other (See Comments)  All extremities feel heavy and hurt   Sulfa Antibiotics Rash   Sulfonamide Derivatives Rash    HOME MEDICATIONS: Outpatient Medications Prior to Visit  Medication Sig Dispense Refill   diphenhydrAMINE (BENADRYL) 25 MG tablet Take 1 tablet (25 mg total) by mouth every 6 (six) hours as needed for itching or allergies. 30 tablet 0   DULoxetine (CYMBALTA) 30 MG capsule TAKE  2 CAPSULES BY MOUTH EVERY DAY 60 capsule 3   ELIQUIS 5 MG TABS tablet TAKE 1 TABLET BY MOUTH TWICE DAILY 60 tablet 2   flecainide (TAMBOCOR) 100 MG tablet Take 1 tablet (100 mg total) by mouth 2 (two) times daily. TAKE 1  TABLET(100 MG) BY MOUTH TWICE DAILY. PLEASE MAKE OVERDUE APPOINTMENT WITH DOCTOR [name] BEFORE ANYMORE REFILLS. THANK YOU FIRST ATTEMPT 180 tablet 0   furosemide (LASIX) 20 MG tablet TAKE 1 TABLET(20 MG) BY MOUTH DAILY 90 tablet 0   letrozole (FEMARA) 2.5 MG tablet TAKE 1 TABLET(2.5 MG) BY MOUTH DAILY 90 tablet 1   metoprolol tartrate (LOPRESSOR) 25 MG tablet Take 1 tablet (25 mg total) by mouth 2 (two) times daily. 60 tablet 5   Multiple Vitamins-Minerals (PRESERVISION AREDS 2+MULTI VIT PO) Take by mouth daily.     omeprazole (PRILOSEC) 40 MG capsule TAKE 1 CAPSULE(40 MG) BY MOUTH TWICE DAILY 60 capsule 3   tiZANidine (ZANAFLEX) 4 MG tablet tizanidine 4 mg tablet  TK 1 T PO BID PRN     traMADol (ULTRAM) 50 MG tablet      allopurinol (ZYLOPRIM) 100 MG tablet allopurinol 100 mg tablet     colchicine 0.6 MG tablet colchicine 0.6 mg tablet (Patient not taking: Reported on 01/29/2021)     gabapentin (NEURONTIN) 100 MG capsule Take 2 capsules (200 mg total) by mouth at bedtime. 180 capsule 3   predniSONE (STERAPRED UNI-PAK 21 TAB) 10 MG (21) TBPK tablet Take by mouth daily. Take 6 tabs by mouth daily  for 1 day, then 5 tabs for 1 day, then 4 tabs for 1 day, then 3 tabs for 1 day, 2 tabs for 1 day, then 1 tab by mouth daily for 1 day 21 tablet 0   No facility-administered medications prior to visit.    PAST MEDICAL HISTORY: Past Medical History:  Diagnosis Date   Atrial fibrillation ([place])    persistent   Colon polyp 2005   Depression    DJD (degenerative joint disease)    Dysrhythmia    Esophageal stricture    Family history of breast cancer    Family history of esophageal cancer    Family history of prostate cancer    Gait abnormality 07/07/2020   GERD (gastroesophageal reflux disease)    H. pylori infection    Hiatal hernia    Hyperlipidemia    Lumbar spinal stenosis 07/07/2020   L4-5   Macular degeneration    Nontoxic multinodular goiter    OA (osteoarthritis)    Postherpetic  neuralgia at T3-T5 level 04/27/2011   Rectal bleeding 07/06/2014   Hemorrhoid related in past.      PAST SURGICAL HISTORY: Past Surgical History:  Procedure Laterality Date   ANTERIOR CERVICAL DECOMP/DISCECTOMY FUSION N/A 05/29/2015   Procedure: ANTERIOR CERVICAL DECOMPRESSION/DISCECTOMY FUSION CERVICAL FOUR-FIVE,CERVICAL FIVE-SIX;  Surgeon: Kary Kos, MD;  Location: [facility];  Service: Neurosurgery;  Laterality: N/A;   APPENDECTOMY     BREAST LUMPECTOMY Left 06/2018   BREAST LUMPECTOMY WITH RADIOACTIVE SEED LOCALIZATION Left 06/25/2018   Procedure: BREAST LUMPECTOMY WITH RADIOACTIVE SEED LOCALIZATION;  Surgeon: Erroll Luna, MD;  Location: [facility];  Service: General;  Laterality: Left;   CARDIAC CATHETERIZATION     CARDIOVERSION  08/29/2011   Procedure: CARDIOVERSION;  Surgeon: Bing Quarry, MD;  Location: [facility];  Service: Cardiovascular;  Laterality: N/A;   CARDIOVERSION  11/22/2011   Procedure: CARDIOVERSION;  Surgeon: Coralyn Mark, MD;  Location: [facility];  Service: Cardiovascular;  Laterality: N/A;   CHOLECYSTECTOMY  1989   COLONOSCOPY W/ POLYPECTOMY  2005   Neg in 2010; Dr Olevia Perches   ESOPHAGEAL DILATION     > 3 X; Dr Olevia Perches   LEFT AND RIGHT HEART CATHETERIZATION WITH CORONARY ANGIOGRAM N/A 12/02/2011   Procedure: LEFT AND [place];  Surgeon: Jolaine Artist, MD;  Location: [facility];  Service: Cardiovascular;  Laterality: N/A;   TOTAL ABDOMINAL HYSTERECTOMY  1972   for pain (no BSO)   TUBAL LIGATION     with appendectomy   UPPER GI ENDOSCOPY  2010   H pylori    FAMILY HISTORY: Family History  Problem Relation Age of Onset   Heart failure Mother    Coronary artery disease Mother    Diabetes Mother    Osteoarthritis Father    Coronary artery disease Father    Prostate cancer Father        in 30s   Breast cancer Sister        in 20's   Diabetes Sister    Esophageal cancer Brother        smoked   Breast  cancer Maternal Aunt     SOCIAL HISTORY: Social History   Socioeconomic History   Marital status: Married    Spouse name: Not on file   Number of children: 4   Years of education: Not on file   Highest education level: Not on file  Occupational History   Occupation: retired  Tobacco Use   Smoking status: Never   Smokeless tobacco: Never  Vaping Use   Vaping Use: Never used  Substance and Sexual Activity   Alcohol use: No   Drug use: No   Sexual activity: Not on file  Other Topics Concern   Not on file  Social History Narrative   Married 1956. 4 children 2 boys 2 girls. 16 grandkids.  5 greatgrandkids.    Pt lives in [place] with spouse.        Retired from [employer]      Hobbies: travel, spend time with people, family time   Exercise-walking      No HCPOA-advised to do this.    Social Determinants of Health   Financial Resource Strain: Not on file  Food Insecurity: Not on file  Transportation Needs: Not on file  Physical Activity: Not on file  Stress: Not on file  Social Connections: Not on file  Intimate Partner Violence: Not on file      PHYSICAL EXAM  Vitals:   04/12/21 1244  BP: (!) 156/70  Pulse: 65  Weight: 158 lb (71.7 kg)  Height: 5\' 4"  (1.626 m)   Body mass index is 27.12 kg/m.  Generalized: Well developed, in no acute distress  Neurological examination  Mentation: Alert oriented to time, place, history taking. Follows all commands speech and language fluent Cranial nerve II-XII: Pupils were equal round reactive to light. Extraocular movements were full, visual field were full on confrontational test. Facial sensation and strength were normal. Head turning and shoulder shrug  were normal and  symmetric. Bruising to frontal, nasal area.  Motor: The motor testing reveals 5 over 5 strength of all 4 extremities. Good symmetric motor tone is noted throughout.  Sensory: Sensory testing is intact to soft touch on all 4  extremities. No evidence of extinction is noted.  Coordination: Cerebellar testing reveals good finger-nose-finger and heel-to-shin bilaterally.  Gait and station: Gait is slightly wide-based, can walk independently, tends to limp slight on the right Reflexes: Deep tendon reflexes are symmetric but depressed throughout  DIAGNOSTIC DATA (LABS, IMAGING, TESTING) - I reviewed patient records, labs, notes, testing and imaging myself where available.  Lab Results  Component Value Date   WBC 7.9 01/29/2021   HGB 12.4 01/29/2021   HCT 37.2 01/29/2021   MCV 87.6 01/29/2021   PLT 313.0 01/29/2021      Component Value Date/Time   NA 139 01/29/2021 1529   NA 143 10/28/2018 1458   K 4.6 01/29/2021 1529   CL 102 01/29/2021 1529   CO2 28 01/29/2021 1529   GLUCOSE 88 01/29/2021 1529   BUN 26 (H) 01/29/2021 1529   BUN 25 10/28/2018 1458   CREATININE 1.31 (H) 01/29/2021 1529   CREATININE 0.95 06/10/2018 0830   CALCIUM 9.5 01/29/2021 1529   PROT 6.6 01/29/2021 1529   ALBUMIN 3.9 01/29/2021 1529   AST 17 01/29/2021 1529   AST 19 06/10/2018 0830   ALT 13 01/29/2021 1529   ALT 15 06/10/2018 0830   ALKPHOS 76 01/29/2021 1529   BILITOT 0.7 01/29/2021 1529   BILITOT 0.7 06/10/2018 0830   GFRNONAA 48 (L) 10/28/2018 1458   GFRNONAA 54 (L) 06/10/2018 0830   GFRAA 55 (L) 10/28/2018 1458   GFRAA >60 06/10/2018 0830   Lab Results  Component Value Date   CHOL 195 01/29/2021   HDL 56.20 01/29/2021   LDLCALC 101 (H) 01/29/2021   TRIG 190.0 (H) 01/29/2021   CHOLHDL 3 01/29/2021   Lab Results  Component Value Date   HGBA1C 5.9 01/29/2021   Lab Results  Component Value Date   VITAMINB12 418 12/03/2019   Lab Results  Component Value Date   TSH 0.92 09/21/2019   ASSESSMENT AND PLAN 87 y.o. year old female  has a past medical history of Atrial fibrillation ([place]), Colon polyp (2005), Depression, DJD (degenerative joint disease), Dysrhythmia, Esophageal stricture, Family history of breast  cancer, Family history of esophageal cancer, Family history of prostate cancer, Gait abnormality (07/07/2020), GERD (gastroesophageal reflux disease), H. pylori infection, Hiatal hernia, Hyperlipidemia, Lumbar spinal stenosis (07/07/2020), Macular degeneration, Nontoxic multinodular goiter, OA (osteoarthritis), Postherpetic neuralgia at T3-T5 level (04/27/2011), and Rectal bleeding (07/06/2014). here with:  1.  Lumbar spinal stenosis, L4-5 level, severe 2.  Bilateral knee discomfort, arthritis 3.  Gait instability 4.  Recent fall, on Eliquis  -Referral to Dr. Saintclair Halsted, is interested in considering surgical options for the lumbar stenosis -Continues with gait abnormality, but has benefited from orthopedic injection to the right knee, PT -Suffered recent fall, in the setting of Ambien at night, recommend discontinuing Ambien, also would recommend CT scan of the head, even maxillofacial, patient defers at this time, no neurological changes, discussed red flags signs -She will follow-up with this office in 6 to 8 months, asks she keep me posted on her plans with Dr. Saintclair Halsted, of note, the last 3 months she has gone through significant change separating from her husband of 73 years -Have urged her to be extremely careful not to fall, as she is at risk for internal bleeding being on  chronic anticoagulation  Butler Denmark, AGNP-C, DNP 04/12/2021, 1:12 PM [facility] [address], [place], [place] [address] [phone]

## 2021-04-12 ENCOUNTER — Ambulatory Visit (INDEPENDENT_AMBULATORY_CARE_PROVIDER_SITE_OTHER): Payer: Medicare Other | Admitting: Neurology

## 2021-04-12 ENCOUNTER — Other Ambulatory Visit: Payer: Self-pay

## 2021-04-12 ENCOUNTER — Encounter: Payer: Self-pay | Admitting: Neurology

## 2021-04-12 VITALS — BP 156/70 | HR 65 | Ht 64.0 in | Wt 158.0 lb

## 2021-04-12 DIAGNOSIS — R269 Unspecified abnormalities of gait and mobility: Secondary | ICD-10-CM | POA: Diagnosis not present

## 2021-04-12 DIAGNOSIS — M48061 Spinal stenosis, lumbar region without neurogenic claudication: Secondary | ICD-10-CM

## 2021-04-12 NOTE — Progress Notes (Signed)
I have read the note, and I agree with the clinical assessment and plan.  Erika Hussar K Chantrice Hagg   

## 2021-04-12 NOTE — Patient Instructions (Signed)
Referral to neurosurgery for consultation  Please be careful not to fall, watch for any neurological changes Follow-up in 6-8 months

## 2021-04-16 ENCOUNTER — Telehealth: Payer: Self-pay | Admitting: Neurology

## 2021-04-16 NOTE — Telephone Encounter (Signed)
Referral for Dr. Saintclair Halsted faxed to [facility]. Phone: [phone]. Fax: [phone].

## 2021-04-26 NOTE — Progress Notes (Signed)
Patient Care Team: Shelva Majestic, MD as PCP - General (Family Medicine) [name], Deloris Ping, MD as PCP - Cardiology (Cardiology) Harriette Bouillon, MD as Consulting Physician (General Surgery) Serena Croissant, MD as Consulting Physician (Hematology and Oncology) Dorothy Puffer, MD as Consulting Physician (Radiation Oncology)  DIAGNOSIS:    ICD-10-CM   1. 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, estrogen receptor positive (HCC)  C50.512    Z17.0       SUMMARY OF ONCOLOGIC HISTORY: Oncology History  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, estrogen receptor positive (HCC)  05/29/2018 Initial Diagnosis   Screening detected left breast calcifications 7 mm size axilla negative biopsy revealed grade 1 invasive ductal carcinoma with calcifications ER 100%, PR 90%, Ki-67 2%, HER-2 negative, T1 be N0 stage Ia AJCC 8    06/25/2018 Surgery   Left lumpectomy: IDC grade 1, 0.4 cm, DCIS low-grade, margins negative, left additional medial excision: DCIS low-grade, margins negative, ER 100%, PR 90%, HER-2 negative, Ki-67 2%, T1 a N0 stage Ia    07/06/2018 -  Anti-estrogen oral therapy   Letrozole 2.5 mg daily, changed to Anastrozole in 11/2018 due to achiness      CHIEF COMPLIANT: Follow-up of left breast cancer on letrozole therapy  INTERVAL HISTORY: Bridget Lane is a 87 y.o. with above-mentioned history of left breast cancer treated with lumpectomy and who is currently on anti-estrogen therapy with anastrozole, after she could not tolerate letrozole. Mammogram on 06/12/20 showed no evidence of malignancy bilaterally. She presents today for follow-up.  She has had chronic low back issues but she does not want to pursue any surgical options.  ALLERGIES:  is allergic to penicillins, codeine, acetaminophen, dabigatran etexilate mesylate, dabigatran etexilate mesylate, sulfa antibiotics, and sulfonamide derivatives.  MEDICATIONS:  Current  Outpatient Medications  Medication Sig Dispense Refill   apixaban (ELIQUIS) 5 MG TABS tablet TAKE 1 TABLET BY MOUTH TWICE DAILY 60 tablet 5   diphenhydrAMINE (BENADRYL) 25 MG tablet Take 1 tablet (25 mg total) by mouth every 6 (six) hours as needed for itching or allergies. 30 tablet 0   DULoxetine (CYMBALTA) 30 MG capsule TAKE  2 CAPSULES BY MOUTH EVERY DAY 60 capsule 3   flecainide (TAMBOCOR) 100 MG tablet Take 1 tablet (100 mg total) by mouth 2 (two) times daily. TAKE 1 TABLET(100 MG) BY MOUTH TWICE DAILY. PLEASE MAKE OVERDUE APPOINTMENT WITH DOCTOR [name] BEFORE ANYMORE REFILLS. THANK YOU FIRST ATTEMPT 180 tablet 0   furosemide (LASIX) 20 MG tablet TAKE 1 TABLET(20 MG) BY MOUTH DAILY 90 tablet 0   letrozole (FEMARA) 2.5 MG tablet TAKE 1 TABLET(2.5 MG) BY MOUTH DAILY 90 tablet 1   metoprolol tartrate (LOPRESSOR) 25 MG tablet Take 1 tablet (25 mg total) by mouth 2 (two) times daily. 60 tablet 5   Multiple Vitamins-Minerals (PRESERVISION AREDS 2+MULTI VIT PO) Take by mouth daily.     omeprazole (PRILOSEC) 40 MG capsule TAKE 1 CAPSULE(40 MG) BY MOUTH TWICE DAILY 60 capsule 3   tiZANidine (ZANAFLEX) 4 MG tablet tizanidine 4 mg tablet  TK 1 T PO BID PRN     traMADol (ULTRAM) 50 MG tablet      No current facility-administered medications for this visit.    PHYSICAL EXAMINATION: ECOG PERFORMANCE STATUS: 1 - Symptomatic but completely ambulatory  Vitals:   04/27/21 1046  BP: (!) 131/48  Pulse: (!) 58  Resp: 17  Temp: 97.6 F (36.4 C)  SpO2: 98%   Filed  Weights   04/27/21 1046  Weight: 161 lb (73 kg)    BREAST: No palpable masses or nodules in either right or left breasts. No palpable axillary supraclavicular or infraclavicular adenopathy no breast tenderness or nipple discharge. (exam performed in the presence of a chaperone)  LABORATORY DATA:  I have reviewed the data as listed CMP Latest Ref Rng & Units 01/29/2021 12/03/2019 09/21/2019  Glucose 70 - 99 mg/dL 88 110(H) -  BUN 6 - 23  mg/dL 26(H) 36(H) -  Creatinine 0.40 - 1.20 mg/dL 1.31(H) 1.35(H) -  Sodium 135 - 145 mEq/L 139 140 -  Potassium 3.5 - 5.1 mEq/L 4.6 4.4 -  Chloride 96 - 112 mEq/L 102 104 -  CO2 19 - 32 mEq/L 28 27 -  Calcium 8.4 - 10.5 mg/dL 9.5 9.6 9.8  Total Protein 6.0 - 8.3 g/dL 6.6 6.8 -  Total Bilirubin 0.2 - 1.2 mg/dL 0.7 0.9 -  Alkaline Phos 39 - 117 U/L 76 85 -  AST 0 - 37 U/L 17 31 -  ALT 0 - 35 U/L 13 25 -    Lab Results  Component Value Date   WBC 7.9 01/29/2021   HGB 12.4 01/29/2021   HCT 37.2 01/29/2021   MCV 87.6 01/29/2021   PLT 313.0 01/29/2021   NEUTROABS 4.9 01/29/2021    ASSESSMENT & PLAN:  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, estrogen receptor positive ([place]) 06/25/2018: Left lumpectomy: IDC grade 1, 0.4 cm, DCIS low-grade, margins negative, left additional medial excision: DCIS low-grade, margins negative, ER 100%, PR 90%, HER-2 negative, Ki-67 2%, T1 a N0 stage Ia Radiation oncology felt that adjuvant radiation is not necessary given her age and favorable prognostic findings.   Current treatment: Letrozole 2.5 mg daily started 07/02/2018, switched to anastrozole and switched back to letrozole Osteopenia: T score -2.1 calcium and vitamin D   Letrozole toxicities: Joint stiffness and achiness: Not bothering her as much by taking it at day time. I renewed her prescription today.   Breast cancer surveillance: Left breast mammogram  06/12/2020: No evidence of malignancy, breast density category B Breast exam 04/27/2021: Benign   Osteopenia: Could not tolerate bisphosphonate therapy. Frequent falls: Patient is seeing neurology.  She and her husband divorced 3 months ago.  She is very happy and peaceful since that happened.  She wants to focus on her health and wellness. Return to clinic in 1 year for follow-up    No orders of the defined types were placed in this encounter.  The patient has a good understanding of the overall plan. she  agrees with it. she will call with any problems that may develop before the next visit here.  Total time spent: 20 mins including face to face time and time spent for planning, charting and coordination of care  Rulon Eisenmenger, MD, MPH 04/27/2021  I, Thana Ates, am acting as scribe for Dr. [name].  I have reviewed the above documentation for accuracy and completeness, and I agree with the above.

## 2021-04-27 ENCOUNTER — Other Ambulatory Visit: Payer: Self-pay | Admitting: Cardiovascular Disease

## 2021-04-27 ENCOUNTER — Other Ambulatory Visit: Payer: Self-pay

## 2021-04-27 ENCOUNTER — Inpatient Hospital Stay: Payer: Medicare Other | Attending: Hematology and Oncology | Admitting: Hematology and Oncology

## 2021-04-27 DIAGNOSIS — C50512 Malignant neoplasm of lower-outer quadrant of left female breast: Secondary | ICD-10-CM | POA: Insufficient documentation

## 2021-04-27 DIAGNOSIS — Z882 Allergy status to sulfonamides status: Secondary | ICD-10-CM | POA: Diagnosis not present

## 2021-04-27 DIAGNOSIS — Z79899 Other long term (current) drug therapy: Secondary | ICD-10-CM | POA: Diagnosis not present

## 2021-04-27 DIAGNOSIS — Z79811 Long term (current) use of aromatase inhibitors: Secondary | ICD-10-CM | POA: Insufficient documentation

## 2021-04-27 DIAGNOSIS — Z170 Estrogen receptor positive status [ER+]: Secondary | ICD-10-CM

## 2021-04-27 DIAGNOSIS — Z7901 Long term (current) use of anticoagulants: Secondary | ICD-10-CM | POA: Diagnosis not present

## 2021-04-27 DIAGNOSIS — Z880 Allergy status to penicillin: Secondary | ICD-10-CM | POA: Insufficient documentation

## 2021-04-27 DIAGNOSIS — R296 Repeated falls: Secondary | ICD-10-CM | POA: Insufficient documentation

## 2021-04-27 DIAGNOSIS — M8580 Other specified disorders of bone density and structure, unspecified site: Secondary | ICD-10-CM | POA: Insufficient documentation

## 2021-04-27 DIAGNOSIS — Z885 Allergy status to narcotic agent status: Secondary | ICD-10-CM | POA: Insufficient documentation

## 2021-04-27 DIAGNOSIS — M2560 Stiffness of unspecified joint, not elsewhere classified: Secondary | ICD-10-CM | POA: Insufficient documentation

## 2021-04-27 MED ORDER — LETROZOLE 2.5 MG PO TABS: ORAL_TABLET | ORAL | 3 refills | Status: DC

## 2021-04-27 NOTE — Assessment & Plan Note (Signed)
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  Current treatment: Letrozole 2.5 mg daily started 07/02/2018, switched to anastrozole and switchedback to letrozole Osteopenia: T score -2.1 calcium and vitamin D  Letrozoletoxicities: Joint stiffness and achiness:Not bothering her as much by taking it at day time. I renewed her prescription today.  Breast cancer surveillance: Left breast mammogram  06/12/2020: No evidence of malignancy, breast density category B Breast exam 04/27/2021: Benign  Osteopenia: Could not tolerate bisphosphonate therapy. Frequent falls: Patient is seeing neurology.  Return to clinic in 1 year for follow-up

## 2021-04-27 NOTE — Telephone Encounter (Signed)
Eliquis 5mg  refill request received. Patient is 87 years old, weight-71.7kg, Crea-1.31 on 01/29/2021, Diagnosis-Afib, and last seen by Dr. Acie Fredrickson on 12/15/2019 and pending an appt with Dr. Acie Fredrickson on 05/21/21. Dose is appropriate based on dosing criteria. Will send in refill to requested pharmacy.

## 2021-04-28 DIAGNOSIS — Z8679 Personal history of other diseases of the circulatory system: Secondary | ICD-10-CM | POA: Diagnosis not present

## 2021-04-28 DIAGNOSIS — Z79899 Other long term (current) drug therapy: Secondary | ICD-10-CM | POA: Diagnosis not present

## 2021-04-28 DIAGNOSIS — R5383 Other fatigue: Secondary | ICD-10-CM | POA: Diagnosis not present

## 2021-04-28 DIAGNOSIS — R600 Localized edema: Secondary | ICD-10-CM | POA: Diagnosis not present

## 2021-04-28 DIAGNOSIS — M129 Arthropathy, unspecified: Secondary | ICD-10-CM | POA: Diagnosis not present

## 2021-04-30 ENCOUNTER — Encounter: Payer: Self-pay | Admitting: Family Medicine

## 2021-04-30 ENCOUNTER — Ambulatory Visit (INDEPENDENT_AMBULATORY_CARE_PROVIDER_SITE_OTHER): Payer: Medicare Other | Admitting: Family Medicine

## 2021-04-30 ENCOUNTER — Other Ambulatory Visit: Payer: Self-pay

## 2021-04-30 VITALS — BP 107/62 | HR 53 | Temp 98.5°F | Ht 64.0 in | Wt 157.2 lb

## 2021-04-30 DIAGNOSIS — I4891 Unspecified atrial fibrillation: Secondary | ICD-10-CM

## 2021-04-30 DIAGNOSIS — G250 Essential tremor: Secondary | ICD-10-CM | POA: Diagnosis not present

## 2021-04-30 DIAGNOSIS — M109 Gout, unspecified: Secondary | ICD-10-CM

## 2021-04-30 DIAGNOSIS — R600 Localized edema: Secondary | ICD-10-CM | POA: Diagnosis not present

## 2021-04-30 NOTE — Patient Instructions (Addendum)
Health Maintenance Due  Topic Date Due   Zoster Vaccines- Shingrix (1 of 2) Please check with your pharmacy to see if they have the shingrix vaccine. If they do- please get this immunization and update [place] by phone call or mychart with dates you receive the vaccine  Never done   Sign release of information at the check out desk for request to release records from [facility].   Continue Lasix daily.   Team fill out form for compress stockings from [facility].   Recommended follow up: Return in about 6 months (around 10/31/2021) for As sooner if needed.

## 2021-04-30 NOTE — Progress Notes (Signed)
Phone [phone] In person visit   Subjective:   Bridget Lane is a 87 y.o. year old very pleasant female patient who presents for/with See problem oriented charting Chief Complaint  Patient presents with   Leg Swelling    Bilateral. Its on and off swelling.    This visit occurred during the SARS-CoV-2 public health emergency.  Safety protocols were in place, including screening questions prior to the visit, additional usage of staff PPE, and extensive cleaning of exam room while observing appropriate contact time as indicated for disinfecting solutions.   Past Medical History-  Patient Active Problem List   Diagnosis Date Noted   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, estrogen receptor positive ([place]) 06/03/2018    Priority: High   Atrial fibrillation 06/14/2011    Priority: High   Insomnia 08/25/2015    Priority: Medium   Spinal stenosis in cervical region 05/29/2015    Priority: Medium   Hyperglycemia 06/07/2014    Priority: Medium   Depression 01/30/2010    Priority: Medium   Hyperlipidemia 01/28/2008    Priority: Medium   Cervical lymphadenopathy 05/16/2016    Priority: Low   History of Helicobacter pylori infection 07/06/2014    Priority: Low   OSA (obstructive sleep apnea) 02/11/2012    Priority: Low   Long term (current) use of anticoagulants 07/19/2011    Priority: Low   Chest pain 06/14/2011    Priority: Low   History of colonic polyps 01/30/2010    Priority: Low   ESOPHAGEAL STRICTURE 07/20/2008    Priority: Low   Osteoarthritis 07/20/2008    Priority: Low   Multiple thyroid nodules 01/28/2008    Priority: Low   GERD 01/28/2008    Priority: Low   Lumbar spinal stenosis 07/07/2020   Gait abnormality 07/07/2020   Acquired thrombophilia ([place]) 09/01/2019   Polyarthralgia 07/15/2019   Osteoporosis 10/30/2018   Genetic testing 07/02/2018   Dyspnea on exertion 03/23/2018   Constipation 08/29/2017    Medications-  reviewed and updated Current Outpatient Medications  Medication Sig Dispense Refill   apixaban (ELIQUIS) 5 MG TABS tablet TAKE 1 TABLET BY MOUTH TWICE DAILY 60 tablet 5   diphenhydrAMINE (BENADRYL) 25 MG tablet Take 1 tablet (25 mg total) by mouth every 6 (six) hours as needed for itching or allergies. 30 tablet 0   DULoxetine (CYMBALTA) 30 MG capsule TAKE  2 CAPSULES BY MOUTH EVERY DAY 60 capsule 3   flecainide (TAMBOCOR) 100 MG tablet Take 1 tablet (100 mg total) by mouth 2 (two) times daily. TAKE 1 TABLET(100 MG) BY MOUTH TWICE DAILY. PLEASE MAKE OVERDUE APPOINTMENT WITH DOCTOR [name] BEFORE ANYMORE REFILLS. THANK YOU FIRST ATTEMPT 180 tablet 0   furosemide (LASIX) 20 MG tablet TAKE 1 TABLET(20 MG) BY MOUTH DAILY 90 tablet 0   letrozole (FEMARA) 2.5 MG tablet TAKE 1 TABLET(2.5 MG) BY MOUTH DAILY 90 tablet 3   metoprolol tartrate (LOPRESSOR) 25 MG tablet Take 1 tablet (25 mg total) by mouth 2 (two) times daily. 60 tablet 5   Multiple Vitamins-Minerals (PRESERVISION AREDS 2+MULTI VIT PO) Take by mouth daily.     omeprazole (PRILOSEC) 40 MG capsule TAKE 1 CAPSULE(40 MG) BY MOUTH TWICE DAILY 60 capsule 3   tiZANidine (ZANAFLEX) 4 MG tablet tizanidine 4 mg tablet  TK 1 T PO BID PRN     traMADol (ULTRAM) 50 MG tablet      No current facility-administered medications for this visit.     Objective:  BP 107/62   Pulse (!) 53   Temp 98.5 F (36.9 C) (Temporal)   Ht 5\' 4"  (1.626 m)   Wt 157 lb 3.2 oz (71.3 kg)   SpO2 97%   BMI 26.98 kg/m  Gen: NAD, resting comfortably No JVD CV: RRR no murmurs rubs or gallops Lungs: CTAB no crackles, wheeze, rhonchi Abdomen: soft/nontender/nondistended/normal bowel sounds. No rebound or guarding.  Ext: trace edema Skin: warm, dry    Assessment and Plan   #social update- birthday tomorrow but grandson had injury today and may postpone trip to [facility]- was tryign to be there for her daughter  - patient may need Rx for [place]- cymbalta, omeprazole,  metoprolol- she will let us know  # Leg swelling S:intermittent swelling in lower legs. Last night propped feet up and seems better today. Turns slightly red and has a hard time fitting into her shoes. Gets some pain in bottom of both feet- if uses a pillow it is helpful.    She went to [facility] medical urgent care- had bloodwork at least 7 tests- was tested for gout, electrolyte issues.   She is using her lasix daily at 20 mg Elevation is helpful. She tries to keep salt intake low.   Only up 3 lbs. Also sleeping on 2 pillows A/P: patient with history of diastolic dysfunction- no overt heart failure at present perhaps mild fluid overload- I think continuing lasix is reasonable but will due 3 days of twice a day lasix separated by 6 hours- also elevate legs and wear compression stockings in the day as tolerable.  - keep salt content low  -get records from [facility] medical  # Atrial fibrillation S: Remains on flecainide as well as metoprolol 25 mg BID- slightly bradycardic even with this Anticoagulated with eliquis 5 mg BID A/P: Stable. Continue current medications.  Glad she is tolerating higher metoprolol dose- bradycardia is tolerable   #Essential tremor-we also increase metoprolol last visit to see if that would help with her essential tremor-today she reports this was helpful for tremor but was felling tired and went back to a half tablet - she was able to retrial and states her writing has improved- with improvement- continue current meds.    #Gout S: 0 flares on allopurinol 100mg - shes not sure shes taking this. No recent flares though she thought leg swelling could have been related to gout.  Lab Results  Component Value Date   LABURIC 7.2 (H) 01/29/2021  A/P: Uric acid has been slightly high but patient has not had any recent gout flares-continue without medication  #Hyperlipidemia S: Medication: none Lab Results  Component Value Date   CHOL 195 01/29/2021   HDL 56.20 01/29/2021    LDLCALC 101 (H) 01/29/2021   TRIG 190.0 (H) 01/29/2021   CHOLHDL 3 01/29/2021  A/P: Poor control in the past with past primary prevention age group- update today   Recommended follow up: No follow-ups on file. Future Appointments  Date Time Provider [place]  05/21/2021 10:00 AM [name], [name], MD [facility]  12/17/2021 12:45 PM [name], NP [facility] None  04/26/2022 10:30 AM [name], MD [facility] None   Lab/Order associations:   ICD-10-CM   1. Bilateral leg edema  R60.0     2. Gout, unspecified cause, unspecified chronicity, unspecified site  M10.9     3. Atrial fibrillation, unspecified type ([place])  I48.91     4. Essential tremor  G25.0      I,Alexis Bryant,acting as a  scribe for Garret Reddish, MD.,have documented all relevant documentation on the behalf of Garret Reddish, MD,as directed by  Garret Reddish, MD while in the presence of Garret Reddish, MD.  I, Garret Reddish, MD, have reviewed all documentation for this visit. The documentation on 04/30/21 for the exam, diagnosis, procedures, and orders are all accurate and complete.    Return precautions advised.  Garret Reddish, MD

## 2021-05-01 DIAGNOSIS — M5416 Radiculopathy, lumbar region: Secondary | ICD-10-CM | POA: Diagnosis not present

## 2021-05-02 ENCOUNTER — Ambulatory Visit: Payer: Medicare Other | Admitting: Cardiovascular Disease

## 2021-05-08 DIAGNOSIS — M545 Low back pain, unspecified: Secondary | ICD-10-CM | POA: Diagnosis not present

## 2021-05-08 DIAGNOSIS — Z7689 Persons encountering health services in other specified circumstances: Secondary | ICD-10-CM | POA: Diagnosis not present

## 2021-05-08 DIAGNOSIS — M5416 Radiculopathy, lumbar region: Secondary | ICD-10-CM | POA: Diagnosis not present

## 2021-05-10 DIAGNOSIS — M5416 Radiculopathy, lumbar region: Secondary | ICD-10-CM | POA: Diagnosis not present

## 2021-05-14 ENCOUNTER — Telehealth: Payer: Self-pay | Admitting: *Deleted

## 2021-05-14 NOTE — Telephone Encounter (Signed)
Clinical pharmacist to review Eliquis 

## 2021-05-14 NOTE — Telephone Encounter (Signed)
   [facility] Pre-operative Risk Assessment    Patient Name: Bridget Lane  DOB: 11/22/1932 MRN: [id number]  HEARTCARE STAFF:  - IMPORTANT!!!!!! Under Visit Info/Reason for Call, type in Other and utilize the format Clearance MM/DD/YY or Clearance TBD. Do not use dashes or single digits. - Please review there is not already an duplicate clearance open for this procedure. - If request is for dental extraction, please clarify the # of teeth to be extracted. - If the patient is currently at the dentist's office, call Pre-Op Callback Staff (MA/nurse) to input urgent request.  - If the patient is not currently in the dentist office, please route to the Pre-Op pool.  Request for surgical clearance:  What type of surgery is being performed? SPINAL INJECTION  When is this surgery scheduled? TBD  What type of clearance is required (medical clearance vs. Pharmacy clearance to hold med vs. Both)? BOTH  Are there any medications that need to be held prior to surgery and how long? ELIQUIS x 3-5 DAYS PRIOR TO INJECTION  Practice name and name of physician performing surgery? [facility]; DR. Marlaine Hind  What is the office phone number? [phone]   7.   What is the office fax number? [phone]  8.   Anesthesia type (None, local, MAC, general) ? NOT LISTED   Julaine Hua 05/14/2021, 4:56 PM  _________________________________________________________________   (provider comments below)

## 2021-05-14 NOTE — Telephone Encounter (Signed)
Patient is scheduled to see Dr. Acie Fredrickson on 8/1, will defer final clearance to MD.

## 2021-05-15 ENCOUNTER — Encounter: Payer: Self-pay | Admitting: Pharmacist

## 2021-05-15 DIAGNOSIS — I700 Atherosclerosis of aorta: Secondary | ICD-10-CM | POA: Insufficient documentation

## 2021-05-15 NOTE — Telephone Encounter (Signed)
Patient with diagnosis of afib on Eliquis for anticoagulation.    Procedure: spinal injection Date of procedure: TBD   CHA2DS2-VASc Score = 4  This indicates a 4.8% annual risk of stroke. The patient's score is based upon: CHF History: No HTN History: No Diabetes History: No Stroke History: No Vascular Disease History: Yes Age Score: 2 Gender Score: 1    CrCl 34.7 mL/min Platelet count 313K  Per office protocol, patient can hold Eliquis for 3 days prior to procedure.    Patient should restart Eliquis on the evening of procedure or day after, at discretion of procedure MD

## 2021-05-16 DIAGNOSIS — Z8679 Personal history of other diseases of the circulatory system: Secondary | ICD-10-CM | POA: Diagnosis not present

## 2021-05-16 DIAGNOSIS — R600 Localized edema: Secondary | ICD-10-CM | POA: Diagnosis not present

## 2021-05-20 ENCOUNTER — Encounter: Payer: Self-pay | Admitting: Cardiovascular Disease

## 2021-05-20 NOTE — Progress Notes (Signed)
Cardiology Office Note:    Date:  05/21/2021   ID:  Bridget Lane, DOB [birth year], MRN [id number]  PCP:  Bridget Olp, MD  Cardiologist:  Bridget Moores, MD  Electrophysiologist:  None   Referring MD: Bridget Olp, MD   Chief Complaint  Patient presents with   Atrial Fibrillation     Aug. 7, 2020    Bridget Lane is a 87 y.o. female with a hx atrial fib.  She is a previous patient of Dr. end and is  transferring care.  She was last seen by Bridget Dopp, PA in January, 2020.  He noted that she had an a history of atrial ablation managed with flecainide and Eliquis.  She has a history of hyperlipidemia and breast cancer.  She was started on furosemide with improvement of her breathing.  Was exercising until she fell 3 months ago  Still has pain in her knee.   xrays do not show significant damange   December 15, 2019:  Bridget Lane is seen today for follow-up of her paroxysmal atrial fibrillation.  She has a history of atrial fibrillation ablation and has been on flecainide and Eliquis.  She has a history of hyperlipidemia and also history of breast cancer. She fell about 8 months ago .   Still recovering from the fall. Is on gabapendin,  synbolta  Has had some palpitations related to leg pain    Aug. 1, 2022: Bridget Lane is seen today for follow up of her PAF. Hx of Afib ablation and is on eliquis. She is in sinus brady today   Here for pre - op visit today . May need to have back surgery .  Has 3 spinal discs that need some attention . Denies having any significant dyspnea or CP . Has some DOE if she walks too far.   She is at low risk for her back surgery if she decides to go ahead with the procedure. She may hold her Eliquis for 3 days prior to surgery .   She may restart it 1-2 days after surgery.    Past Medical History:  Diagnosis Date   Atrial fibrillation ([place])    persistent   Colon polyp 2005   Depression    DJD (degenerative joint disease)     Dysrhythmia    Esophageal stricture    Family history of breast cancer    Family history of esophageal cancer    Family history of prostate cancer    Gait abnormality 07/07/2020   GERD (gastroesophageal reflux disease)    H. pylori infection    Hiatal hernia    Hyperlipidemia    Lumbar spinal stenosis 07/07/2020   L4-5   Macular degeneration    Nontoxic multinodular goiter    OA (osteoarthritis)    Postherpetic neuralgia at T3-T5 level 04/27/2011   Rectal bleeding 07/06/2014   Hemorrhoid related in past.      Past Surgical History:  Procedure Laterality Date   ANTERIOR CERVICAL DECOMP/DISCECTOMY FUSION N/A 05/29/2015   Procedure: ANTERIOR CERVICAL DECOMPRESSION/DISCECTOMY FUSION CERVICAL FOUR-FIVE,CERVICAL FIVE-SIX;  Surgeon: Kary Kos, MD;  Location: [facility];  Service: Neurosurgery;  Laterality: N/A;   APPENDECTOMY     BREAST LUMPECTOMY Left 06/2018   BREAST LUMPECTOMY WITH RADIOACTIVE SEED LOCALIZATION Left 06/25/2018   Procedure: BREAST LUMPECTOMY WITH RADIOACTIVE SEED LOCALIZATION;  Surgeon: Erroll Luna, MD;  Location: [place];  Service: General;  Laterality: Left;   [place]  08/29/2011   Procedure: CARDIOVERSION;  Surgeon: Bing Quarry, MD;  Location: [place];  Service: Cardiovascular;  Laterality: N/A;   CARDIOVERSION  11/22/2011   Procedure: CARDIOVERSION;  Surgeon: Coralyn Mark, MD;  Location: [place];  Service: Cardiovascular;  Laterality: N/A;   CHOLECYSTECTOMY  1989   COLONOSCOPY W/ POLYPECTOMY  2005   Neg in 2010; Dr Olevia Perches   ESOPHAGEAL DILATION     > 3 X; Dr Olevia Perches   LEFT AND RIGHT HEART CATHETERIZATION WITH CORONARY ANGIOGRAM N/A 12/02/2011   Procedure: LEFT AND [place];  Surgeon: Jolaine Artist, MD;  Location: [facility] C[facility];  Service: Cardiovascular;  Laterality: N/A;   TOTAL ABDOMINAL HYSTERECTOMY  1972   for pain (no BSO)   TUBAL LIGATION     with appendectomy    UPPER GI ENDOSCOPY  2010   H pylori    Current Medications: Current Meds  Medication Sig   apixaban (ELIQUIS) 5 MG TABS tablet TAKE 1 TABLET BY MOUTH TWICE DAILY   diphenhydrAMINE (BENADRYL) 25 MG tablet Take 1 tablet (25 mg total) by mouth every 6 (six) hours as needed for itching or allergies.   flecainide (TAMBOCOR) 100 MG tablet Take 1 tablet (100 mg total) by mouth 2 (two) times daily. TAKE 1 TABLET(100 MG) BY MOUTH TWICE DAILY. PLEASE MAKE OVERDUE APPOINTMENT WITH DOCTOR Bridget Lane BEFORE ANYMORE REFILLS. THANK YOU FIRST ATTEMPT   furosemide (LASIX) 20 MG tablet TAKE 1 TABLET(20 MG) BY MOUTH DAILY   letrozole (FEMARA) 2.5 MG tablet TAKE 1 TABLET(2.5 MG) BY MOUTH DAILY   metoprolol tartrate (LOPRESSOR) 25 MG tablet Take 1 tablet (25 mg total) by mouth 2 (two) times daily.   Multiple Vitamins-Minerals (PRESERVISION AREDS 2+MULTI VIT PO) Take by mouth daily.   omeprazole (PRILOSEC) 40 MG capsule TAKE 1 CAPSULE(40 MG) BY MOUTH TWICE DAILY   tiZANidine (ZANAFLEX) 4 MG tablet Take 4 mg by mouth daily as needed.     Allergies:   Penicillins, Codeine, Acetaminophen, Dabigatran etexilate mesylate, Dabigatran etexilate mesylate, Sulfa antibiotics, and Sulfonamide derivatives   Social History   Socioeconomic History   Marital status: Legally Separated    Spouse name: Not on file   Number of children: 4   Years of education: Not on file   Highest education level: Not on file  Occupational History   Occupation: retired  Tobacco Use   Smoking status: Never   Smokeless tobacco: Never  Vaping Use   Vaping Use: Never used  Substance and Sexual Activity   Alcohol use: No   Drug use: No   Sexual activity: Not on file  Other Topics Concern   Not on file  Social History Narrative   Married 1956. 4 children 2 boys 2 girls. 16 grandkids.  5 greatgrandkids.    Pt lives in [place] with spouse.        Retired from [employer]      Hobbies: travel, spend time with people,  family time   Exercise-walking      No HCPOA-advised to do this.    Social Determinants of Health   Financial Resource Strain: Not on file  Food Insecurity: Not on file  Transportation Needs: Not on file  Physical Activity: Not on file  Stress: Not on file  Social Connections: Not on file     Family History: The patient's family history includes Breast cancer in her maternal aunt and sister; Coronary artery disease in her father and mother; Diabetes in her  mother and sister; Esophageal cancer in her brother; Heart failure in her mother; Osteoarthritis in her father; Prostate cancer in her father.  ROS:   Please see the history of present illness.     All other systems reviewed and are negative.  EKGs/Labs/Other Studies Reviewed:      Recent Labs: 01/29/2021: ALT 13; BUN 26; Creatinine, Ser 1.31; Hemoglobin 12.4; Platelets 313.0; Potassium 4.6; Sodium 139  Recent Lipid Panel    Component Value Date/Time   CHOL 195 01/29/2021 1529   TRIG 190.0 (H) 01/29/2021 1529   HDL 56.20 01/29/2021 1529   CHOLHDL 3 01/29/2021 1529   VLDL 38.0 01/29/2021 1529   LDLCALC 101 (H) 01/29/2021 1529    Physical Exam:    Physical Exam: Blood pressure (!) 106/58, pulse (!) 54, height '5\' 4"'$  (1.626 m), weight 159 lb (72.1 kg).  GEN:  Well nourished, well developed in no acute distress HEENT: Normal NECK: No JVD; No carotid bruits LYMPHATICS: No lymphadenopathy CARDIAC: RRR , no murmurs, rubs, gallops RESPIRATORY:  Clear to auscultation without rales, wheezing or rhonchi  ABDOMEN: Soft, non-tender, non-distended MUSCULOSKELETAL:  No edema; No deformity  SKIN: Warm and dry NEUROLOGIC:  Alert and oriented x 3  ECG:   Aug. 1, 2022:   Sinus brady at 54.  1st degree AV block    ASSESSMENT:    1. Paroxysmal atrial fibrillation (HCC)    PLAN:      Paroxysmal atrial fib : Bridget Lane remains in sinus rhythm.  She is on flecainide, Eliquis, metoprolol.  Heart rate remains low but she is  asymptomatic.  She denies any syncope.  Continue current medications.  She is at low risk for her upcoming back surgery.  Given the okay to hold the Eliquis for 3 days prior to surgery and for 1 to 2 days following surgery if needed.    Follow up in 1 year with APP   Medication Adjustments/Labs and Tests Ordered: Current medicines are reviewed at length with the patient today.  Concerns regarding medicines are outlined above.  Orders Placed This Encounter  Procedures   EKG 12-Lead    No orders of the defined types were placed in this encounter.   Patient Instructions  Medication Instructions:  Your physician recommends that you continue on your current medications as directed. Please refer to the Current Medication list given to you today.  *If you need a refill on your cardiac medications before your next appointment, please call your pharmacy*   Follow-Up: At [facility], you and your health needs are our priority.  As part of our continuing mission to provide you with exceptional heart care, we have created designated Provider Care Teams.  These Care Teams include your primary Cardiologist (physician) and Advanced Practice Providers (APPs -  Physician Assistants and Nurse Practitioners) who all work together to provide you with the care you need, when you need it.  Your next appointment:   1 year(s)  The format for your next appointment:   In Person  Provider:   You will see one of the following Advanced Practice Providers on your designated Care Team:   Bridget Dopp, PA-C Robbie Lis, [place]     Signed, Bridget Moores, MD  05/21/2021 10:50 AM    [place]

## 2021-05-21 ENCOUNTER — Encounter: Payer: Self-pay | Admitting: Cardiovascular Disease

## 2021-05-21 ENCOUNTER — Ambulatory Visit: Payer: Medicare Other | Admitting: Cardiovascular Disease

## 2021-05-21 ENCOUNTER — Other Ambulatory Visit: Payer: Self-pay

## 2021-05-21 VITALS — BP 106/58 | HR 54 | Ht 64.0 in | Wt 159.0 lb

## 2021-05-21 DIAGNOSIS — I480 Paroxysmal atrial fibrillation: Secondary | ICD-10-CM

## 2021-05-21 NOTE — Telephone Encounter (Signed)
Bridget Lane is at low risk for her upcoming back surgery.  She may hold her Eliquis for 3 days prior to her surgery and may hold it 1 to 2 days following surgery as directed by her surgeon.

## 2021-05-21 NOTE — Patient Instructions (Signed)
Medication Instructions:  Your physician recommends that you continue on your current medications as directed. Please refer to the Current Medication list given to you today.  *If you need a refill on your cardiac medications before your next appointment, please call your pharmacy*   Follow-Up: At [facility], you and your health needs are our priority.  As part of our continuing mission to provide you with exceptional heart care, we have created designated Provider Care Teams.  These Care Teams include your primary Cardiologist (physician) and Advanced Practice Providers (APPs -  Physician Assistants and Nurse Practitioners) who all work together to provide you with the care you need, when you need it.  Your next appointment:   1 year(s)  The format for your next appointment:   In Person  Provider:   You will see one of the following Advanced Practice Providers on your designated Care Team:   Richardson Dopp, PA-C Vin [place], [place]

## 2021-05-22 ENCOUNTER — Ambulatory Visit: Payer: Medicare Other | Attending: Student | Admitting: Physical Therapy

## 2021-05-22 ENCOUNTER — Encounter: Payer: Self-pay | Admitting: Physical Therapy

## 2021-05-22 ENCOUNTER — Other Ambulatory Visit: Payer: Self-pay | Admitting: Family Medicine

## 2021-05-22 DIAGNOSIS — R2689 Other abnormalities of gait and mobility: Secondary | ICD-10-CM | POA: Diagnosis present

## 2021-05-22 DIAGNOSIS — R296 Repeated falls: Secondary | ICD-10-CM

## 2021-05-22 DIAGNOSIS — M6281 Muscle weakness (generalized): Secondary | ICD-10-CM

## 2021-05-22 DIAGNOSIS — R2681 Unsteadiness on feet: Secondary | ICD-10-CM | POA: Diagnosis present

## 2021-05-22 DIAGNOSIS — M5416 Radiculopathy, lumbar region: Secondary | ICD-10-CM | POA: Diagnosis present

## 2021-05-22 NOTE — Patient Instructions (Signed)
Access Code: [email or web address] URL: [email or web address] Date: 05/22/2021 Prepared by: Venetia Night Terrah Decoster  Exercises Hooklying Single Knee to Chest Stretch - 1 x daily - 7 x weekly - 1 sets - 3 reps - 10 hold Supine Double Knee to Chest - 1 x daily - 7 x weekly - 1 sets - 3 reps - 10 hold Supine Piriformis Stretch with Foot on Ground - 1 x daily - 7 x weekly - 1 sets - 2 reps - 30 hold Supine Butterfly Groin Stretch - 1 x daily - 7 x weekly - 1 sets - 3 reps - 10 hold Seated Hamstring Stretch - 1 x daily - 7 x weekly - 1 sets - 3 reps - 15 hold Sit to Stand with Armchair - 3 x daily - 7 x weekly - 1 sets - 5 reps Seated Long Arc Quad - 3 x daily - 7 x weekly - 1 sets - 10 reps

## 2021-05-22 NOTE — Therapy (Signed)
[facility] [address] [place], [place], [address] Phone: [phone]   Fax:  [phone]  Physical Therapy Evaluation  Patient Details  Name: Bridget Lane MRN: [id number] Date of Birth: 1932-12-23 Referring Provider (PT): Meyran, Ocie Cornfield, NP   Encounter Date: 05/22/2021   PT End of Session - 05/22/21 1533     Visit Number 1    Date for PT Re-Evaluation 07/17/21    Authorization Type BCBS Medicare    Progress Note Due on Visit 10    PT Start Time [id number]    PT Stop Time [id number]    PT Time Calculation (min) 46 min    Activity Tolerance Patient tolerated treatment well    Behavior During Therapy [facility] for tasks assessed/performed             Past Medical History:  Diagnosis Date   Atrial fibrillation ([place])    persistent   Colon polyp 2005   Depression    DJD (degenerative joint disease)    Dysrhythmia    Esophageal stricture    Family history of breast cancer    Family history of esophageal cancer    Family history of prostate cancer    Gait abnormality 07/07/2020   GERD (gastroesophageal reflux disease)    H. pylori infection    Hiatal hernia    Hyperlipidemia    Lumbar spinal stenosis 07/07/2020   L4-5   Macular degeneration    Nontoxic multinodular goiter    OA (osteoarthritis)    Postherpetic neuralgia at T3-T5 level 04/27/2011   Rectal bleeding 07/06/2014   Hemorrhoid related in past.      Past Surgical History:  Procedure Laterality Date   ANTERIOR CERVICAL DECOMP/DISCECTOMY FUSION N/A 05/29/2015   Procedure: ANTERIOR CERVICAL DECOMPRESSION/DISCECTOMY FUSION CERVICAL FOUR-FIVE,CERVICAL FIVE-SIX;  Surgeon: Kary Kos, MD;  Location: [facility];  Service: Neurosurgery;  Laterality: N/A;   APPENDECTOMY     BREAST LUMPECTOMY Left 06/2018   BREAST LUMPECTOMY WITH RADIOACTIVE SEED LOCALIZATION Left 06/25/2018   Procedure: BREAST LUMPECTOMY WITH RADIOACTIVE SEED LOCALIZATION;  Surgeon: Erroll Luna, MD;  Location: [place];  Service: General;  Laterality: Left;   CARDIAC CATHETERIZATION     CARDIOVERSION  08/29/2011   Procedure: CARDIOVERSION;  Surgeon: Bing Quarry, MD;  Location: [place];  Service: Cardiovascular;  Laterality: N/A;   CARDIOVERSION  11/22/2011   Procedure: CARDIOVERSION;  Surgeon: Coralyn Mark, MD;  Location: [place];  Service: Cardiovascular;  Laterality: N/A;   CHOLECYSTECTOMY  1989   COLONOSCOPY W/ POLYPECTOMY  2005   Neg in 2010; Dr Olevia Perches   ESOPHAGEAL DILATION     > 3 X; Dr Olevia Perches   LEFT AND RIGHT HEART CATHETERIZATION WITH CORONARY ANGIOGRAM N/A 12/02/2011   Procedure: LEFT AND [place];  Surgeon: Jolaine Artist, MD;  Location: [facility] C[facility];  Service: Cardiovascular;  Laterality: N/A;   TOTAL ABDOMINAL HYSTERECTOMY  1972   for pain (no BSO)   TUBAL LIGATION     with appendectomy   UPPER GI ENDOSCOPY  2010   H pylori    There were no vitals filed for this visit.    Subjective Assessment - 05/22/21 1446     Subjective Pt referred to OPPT for long history of lumbar spine pain with radicular symptoms.  LE symptoms are bil, maybe Rt>Lt.  Rt leg swells and the knee is bad.  I have started falling a lot.  I have  stopped walking for exercise due to falling so I need something I can do to work my legs more safely.  LE symptoms are worse than back.    Pertinent History fall risk, a-fib, gout, Rt knee pain, essential tremor, bil leg edema, Hx of breast cancer    Limitations Standing;Walking    How long can you walk comfortably? unsure but I get winded, SOB limits me more than pain    Diagnostic tests lumbar MRI: severe spinal stenosis L4/5 with Gr I anterolisthesis, severe DDD    Patient Stated Goals learn HEP for arms and legs, walk better    Currently in Pain? Yes    Pain Score 4     Pain Location Leg    Pain Orientation Right;Left    Pain Descriptors / Indicators Tiring    Pain Type Chronic  pain    Pain Radiating Towards all over my legs, Rt knee    Pain Onset More than a month ago    Pain Frequency Intermittent    Aggravating Factors  stand, walk    Pain Relieving Factors sitting    Effect of Pain on Daily Activities has stopped walking for exercise                [facility] PT Assessment - 05/22/21 0001       Assessment   Medical Diagnosis Radiculopathy, lumbar region (M54.16)    Referring Provider (PT) Meyran, Ocie Cornfield, NP    Hand Dominance Right    Next MD Visit maybe an ESI if PT isn't helping    Prior Therapy yes      Precautions   Precautions Fall      Balance Screen   Has the patient fallen in the past 6 months Yes    How many times? 2    Has the patient had a decrease in activity level because of a fear of falling?  Yes    Is the patient reluctant to leave their home because of a fear of falling?  No      Home Environment   Living Environment Private residence   apartment   [place] Access Level entry    Home Layout One level    [place] - single point      Prior Function   Level of Independence Independent      Cognition   Overall Cognitive Status Within Functional Limits for tasks assessed      Observation/Other Assessments-Edema    Edema --   bil LE general edema     Posture/Postural Control   Posture/Postural Control Postural limitations    Postural Limitations Rounded Shoulders;Forward head;Increased thoracic kyphosis;Flexed trunk      ROM / Strength   AROM / PROM / Strength AROM;Strength;PROM      AROM   Overall AROM Comments trunk ROM limited 30% all cardinal planes without pain      PROM   Overall PROM Comments bil hip ER and flexion limited 30%      Strength   Overall Strength Comments bil knees and hips 4/5      Flexibility   Soft Tissue Assessment /Muscle Length yes   bil adductors lack 50% length   Hamstrings lacks 25% bil    Piriformis lacks 20% bil       Special Tests    Special Tests Lumbar    Lumbar Tests Straight Leg Raise  Straight Leg Raise   Findings Negative      Transfers   Transfers Independent with all Transfers      Ambulation/Gait   Ambulation/Gait Yes    Ambulation/Gait Assistance 6: Modified independent (Device/Increase time)    Assistive device None    Gait Pattern Step-through pattern;Scissoring;Trunk flexed;Narrow base of support    Gait Comments Rt knee valgus present, bil ankle instability      Standardized Balance Assessment   Standardized Balance Assessment Timed Up and Go Test;Five Times Sit to Stand    Five times sit to stand comments  23 sec, heavy use of UEs      Timed Up and Go Test   TUG Normal TUG    Normal TUG (seconds) 16                        Objective measurements completed on examination: See above findings.       [place] Adult PT Treatment/Exercise - 05/22/21 0001       Self-Care   Self-Care Other Self-Care Comments    Other Self-Care Comments  proper footwear to optimize ankle stability and reduce fall risk      Exercises   Other Exercises  initiated HEP                    PT Education - 05/22/21 1528     Education Details Access Code: [email or web address]              PT Short Term Goals - 05/22/21 1613       PT SHORT TERM GOAL #1   Title Pt will be independent with initial HEP in order to indicate dec fall risk and improve functional mobility    Time 3    Period Weeks    Status New    Target Date 06/12/21      PT SHORT TERM GOAL #2   Title Pt will improve 5x sit to stand to </= 18 sec    Baseline 23    Time 4    Period Weeks    Status New    Target Date 06/19/21      PT SHORT TERM GOAL #3   Title Pt will participate in 3' walk test with min SOB and LE pain    Time 4    Period Weeks    Status New    Target Date 06/19/21      PT SHORT TERM GOAL #4   Title Improve TUG to </= 14 sec    Baseline 16    Time 4    Period Weeks     Status New    Target Date 06/19/21      PT SHORT TERM GOAL #5   Title -               PT Long Term Goals - 05/22/21 1615       PT LONG TERM GOAL #1   Title Pt will be independent with final HEP in order to indicate dec fall risk and improve functional mobility.    Time 8    Period Weeks    Status New    Target Date 07/17/21      PT LONG TERM GOAL #2   Title Pt will be able to participate in 6' walk test with min or no LOB and with min or no SOB    Baseline 10 min    Time 8  Period Weeks    Status New    Target Date 07/17/21      PT LONG TERM GOAL #3   Title Pt will achieve LE flexibility to WNL to improve step length and gait quality (reduced scissoring)    Time 8    Period Weeks    Status New    Target Date 07/17/21      PT LONG TERM GOAL #4   Title Improve TUG to </= 12 sec to demo reduced fall risk    Baseline 16    Time 8    Period Weeks    Status New    Target Date 07/17/21      PT LONG TERM GOAL #5   Title Pt will improve 5x sit to stand to </= 16 sec with min or no UE use to demo improved functional strength of bil LEs and reduced fall risk.    Time 8    Period Weeks    Status New    Target Date 07/17/21                    Plan - 05/22/21 1528     Clinical Impression Statement Pt is a pleasant 87yo female with complex PMH referred to OPPT with ongoing history of lumbar radiculopathy x 3 years.  Symptoms are worse in bil LEs than lumbar region.  She has severe DDD, spinal stenosis and Gr I anterolisthesis.  Pt reports she has had a signif decrease in activity level and is interested in learning HEP to improve LE strength and walking confidence/tolerance.  She reports walking is limited to 10 min due to SOB > leg pain.  She has a history of recent falls and Rt knee pain.  Pt states legs swell in varying degrees daily.  Pt is ind with gait and all transfers.  Gait pattern includes narrow base of support with Rt LE scissoring and observable  ankle instability (Pt wearing unsupportive shoes today), with flexed trunk.  Rt knee valgus contributes to gait dysfunction.  Pt with 4-/5 bil hip and knee strength.  Trunk ROM is limited by approx. 30% without pain.  LE flexibility is limited in bil hamstrings, piriformis and adductors.  Pt has + fall risk measures including 5x sit to stand in 23 sec with signif UE use and TUG of 16 sec.  Pt will benefit from skilled PT to address pain and deficits to increase safety, gait quality and endurance, and functional strength.    Personal Factors and Comorbidities Age;Fitness;Time since onset of injury/illness/exacerbation;Comorbidity 1;Comorbidity 2;Comorbidity 3+    Comorbidities fall risk, a-fib, bil LE edema, Rt knee pain    Examination-Activity Limitations Locomotion Level;Squat;Stairs;Stand;Lift;Bend    Examination-Participation Restrictions Community Activity;Cleaning;Shop;Laundry    Stability/Clinical Decision Making Evolving/Moderate complexity    Clinical Decision Making Moderate    Rehab Potential Good    PT Frequency 2x / week   Pt will start with 1x/week due to financial concerns   PT Duration 8 weeks    PT Treatment/Interventions ADLs/Self Care Home Management;Aquatic Therapy;Joint Manipulations;Spinal Manipulations;Dry needling;Passive range of motion;Gait training;Functional mobility training;Therapeutic activities;Therapeutic exercise;Balance training;Neuromuscular re-education;Moist Heat;Electrical Stimulation;Cryotherapy;Patient/family education;Manual techniques    PT Next Visit Plan NuStep, HEP progress LE strength for HEP, work on sit to stand with LE>UE goal, add tband row, counter LE strength, basic balance, try 3' walk test for baseline    PT Home Exercise Plan Access Code: [email or web address]    Consulted and Agree with Plan of Care Patient  Patient will benefit from skilled therapeutic intervention in order to improve the following deficits and impairments:  Abnormal gait,  Decreased range of motion, Pain, Postural dysfunction, Increased edema, Decreased strength, Impaired flexibility, Hypomobility, Decreased balance, Decreased activity tolerance, Cardiopulmonary status limiting activity, Decreased endurance  Visit Diagnosis: Radiculopathy, lumbar region - Plan: PT plan of care cert/re-cert  Repeated falls - Plan: PT plan of care cert/re-cert  Unsteadiness on feet - Plan: PT plan of care cert/re-cert  Muscle weakness (generalized) - Plan: PT plan of care cert/re-cert  Other abnormalities of gait and mobility - Plan: PT plan of care cert/re-cert     Problem List Patient Active Problem List   Diagnosis Date Noted   Aortic atherosclerosis ([place]) 05/15/2021   Lumbar spinal stenosis 07/07/2020   Gait abnormality 07/07/2020   Acquired thrombophilia ([place]) 09/01/2019   Polyarthralgia 07/15/2019   Osteoporosis 10/30/2018   Genetic testing 07/02/2018   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, estrogen receptor positive ([place]) 06/03/2018   Dyspnea on exertion 03/23/2018   Constipation 08/29/2017   Cervical lymphadenopathy 05/16/2016   Insomnia 08/25/2015   Spinal stenosis in cervical region [id number]   History of Helicobacter pylori infection 07/06/2014   Hyperglycemia 06/07/2014   OSA (obstructive sleep apnea) 02/11/2012   Long term (current) use of anticoagulants 07/19/2011   Atrial fibrillation 06/14/2011   Chest pain 06/14/2011   Depression 01/30/2010   History of colonic polyps 01/30/2010   ESOPHAGEAL STRICTURE 07/20/2008   Osteoarthritis 07/20/2008   Multiple thyroid nodules 01/28/2008   Hyperlipidemia 01/28/2008   GERD 01/28/2008    Venetia Night Dezarai Prew, PT 05/22/21 4:26 PM    [facility] [address] [place], [place], [address] Phone: [phone]   Fax:  [phone]  Name: Bridget Lane MRN: [id number] Date of Birth: 01/04/1933

## 2021-05-23 NOTE — Telephone Encounter (Signed)
    Patient Name: Bridget Lane  DOB: [birth year] MRN: [id number]  Primary Cardiologist: Mertie Moores, MD  Chart reviewed as part of pre-operative protocol coverage. Given past medical history and time since last visit, based on ACC/AHA guidelines, [name] would be at acceptable risk for the planned procedure without further cardiovascular testing.   Patient was cleared by Dr. Acie Fredrickson to proceed with procedure.  She is a low risk candidate.  She may hold Eliquis for 3 days prior to her surgery and may hold it 1 to 2 days afterward as directed by her surgeon.  The patient was advised that if she develops new symptoms prior to surgery to contact our office to arrange for a follow-up visit, and she verbalized understanding.  I will route this recommendation to the requesting party via Epic fax function and remove from pre-op pool.  Please call with questions.  [place], [place] 05/23/2021, 12:28 PM

## 2021-05-23 NOTE — Telephone Encounter (Signed)
Molly from [facility] was following up on the status of the surgical clearance.   Please fax results to [phone]

## 2021-05-29 ENCOUNTER — Ambulatory Visit: Payer: Medicare Other | Admitting: Family Medicine

## 2021-05-29 ENCOUNTER — Telehealth: Payer: Self-pay | Admitting: Physical Therapy

## 2021-05-29 ENCOUNTER — Ambulatory Visit: Payer: Medicare Other | Admitting: Physical Therapy

## 2021-05-29 ENCOUNTER — Other Ambulatory Visit: Payer: Self-pay | Admitting: Cardiovascular Disease

## 2021-05-29 NOTE — Telephone Encounter (Signed)
Pt was a no show to PT appointment on 05/29/21 at 11am.  PT called and spoke to Pt who was confused about appointment time.  Rescheduled for 05/30/21 at 8:45am.  Baruch Merl, PT 05/29/21 11:21 AM

## 2021-05-29 NOTE — Progress Notes (Deleted)
   I, [name], LAT, ATC, am serving as scribe for Dr. [name].  Bridget Lane is a 87 y.o. female who presents to [place] at [facility] today for f/u of chronic R knee pain due to DJD.  She was last seen by Dr. Georgina Snell on 02/14/21 and had  a R knee steroid injection.  Since her last visit, pt reports   Diagnostic imaging: R and L knee XR- 07/19/20  Pertinent review of systems: ***  Relevant historical information: ***   Exam:  There were no vitals taken for this visit. General: Well Developed, well nourished, and in no acute distress.   MSK: ***    Lab and Radiology Results No results found for this or any previous visit (from the past 72 hour(s)). No results found.     Assessment and Plan: 87 y.o. female with ***   PDMP not reviewed this encounter. No orders of the defined types were placed in this encounter.  No orders of the defined types were placed in this encounter.    Discussed warning signs or symptoms. Please see discharge instructions. Patient expresses understanding.   ***

## 2021-05-30 ENCOUNTER — Ambulatory Visit: Payer: Medicare Other | Admitting: Physical Therapy

## 2021-05-30 ENCOUNTER — Encounter: Payer: Self-pay | Admitting: Physical Therapy

## 2021-05-30 ENCOUNTER — Other Ambulatory Visit: Payer: Self-pay

## 2021-05-30 ENCOUNTER — Ambulatory Visit: Payer: Medicare Other | Admitting: Family Medicine

## 2021-05-30 DIAGNOSIS — M5416 Radiculopathy, lumbar region: Secondary | ICD-10-CM

## 2021-05-30 DIAGNOSIS — R296 Repeated falls: Secondary | ICD-10-CM | POA: Diagnosis not present

## 2021-05-30 DIAGNOSIS — M6281 Muscle weakness (generalized): Secondary | ICD-10-CM | POA: Diagnosis not present

## 2021-05-30 DIAGNOSIS — R2689 Other abnormalities of gait and mobility: Secondary | ICD-10-CM

## 2021-05-30 DIAGNOSIS — R2681 Unsteadiness on feet: Secondary | ICD-10-CM

## 2021-05-30 NOTE — Progress Notes (Deleted)
   I, Bridget Lane, LAT, ATC, am serving as scribe for Dr. [name].  Bridget Lane is a 87 y.o. female who presents to [place] at [facility] today for f/u of chronic R knee pain due to DJD.  She was last seen by Dr. Georgina Snell on 02/14/21 and had a R knee steroid injection.  Today, pt reports   Diagnostic testing: R and L knee XR- 07/19/20  Pertinent review of systems: ***  Relevant historical information: ***   Exam:  There were no vitals taken for this visit. General: Well Developed, well nourished, and in no acute distress.   MSK: ***    Lab and Radiology Results No results found for this or any previous visit (from the past 72 hour(s)). No results found.     Assessment and Plan: 87 y.o. female with ***   PDMP not reviewed this encounter. No orders of the defined types were placed in this encounter.  No orders of the defined types were placed in this encounter.    Discussed warning signs or symptoms. Please see discharge instructions. Patient expresses understanding.   ***

## 2021-05-30 NOTE — Patient Instructions (Signed)
Access Code: [email or web address] URL: [email or web address] Date: 05/30/2021 Prepared by: [place]ia Night Audra Kagel  Exercises Hooklying Single Knee to Chest Stretch - 1 x daily - 7 x weekly - 1 sets - 3 reps - 10 hold Supine Double Knee to Chest - 1 x daily - 7 x weekly - 1 sets - 3 reps - 10 hold Supine Piriformis Stretch with Foot on Ground - 1 x daily - 7 x weekly - 1 sets - 2 reps - 30 hold Supine Butterfly Groin Stretch - 1 x daily - 7 x weekly - 1 sets - 3 reps - 10 hold Seated Hamstring Stretch - 1 x daily - 7 x weekly - 1 sets - 3 reps - 15 hold Sit to Stand with Armchair - 3 x daily - 7 x weekly - 1 sets - 5 reps Seated Long Arc Quad - 3 x daily - 7 x weekly - 1 sets - 10 reps Heel Toe Raises with Counter Support - 1 x daily - 7 x weekly - 1 sets - 10 reps Standing Hip Abduction with Counter Support - 1 x daily - 7 x weekly - 1 sets - 10 reps Standing Hip Extension with Counter Support - 1 x daily - 7 x weekly - 1 sets - 10 reps Seated Hip Abduction with Resistance - 1 x daily - 7 x weekly - 1 sets - 10 reps

## 2021-05-30 NOTE — Therapy (Signed)
[facility] [address], [place], [place], [address] Phone: [phone]   Fax:  [phone]  Physical Therapy Treatment  Patient Details  Name: Bridget Lane MRN: [id number] Date of Birth: 03-Dec-1932 Referring Provider (PT): Meyran, Ocie Cornfield, NP   Encounter Date: 05/30/2021   PT End of Session - 05/30/21 0935     Visit Number 2    Date for PT Re-Evaluation 07/17/21    Authorization Type BCBS Medicare    Progress Note Due on Visit 10    PT Start Time [phone]    PT Stop Time 0930    PT Time Calculation (min) 43 min    Activity Tolerance Patient tolerated treatment well    Behavior During Therapy [facility] for tasks assessed/performed             Past Medical History:  Diagnosis Date   Atrial fibrillation ([place])    persistent   Colon polyp 2005   Depression    DJD (degenerative joint disease)    Dysrhythmia    Esophageal stricture    Family history of breast cancer    Family history of esophageal cancer    Family history of prostate cancer    Gait abnormality 07/07/2020   GERD (gastroesophageal reflux disease)    H. pylori infection    Hiatal hernia    Hyperlipidemia    Lumbar spinal stenosis 07/07/2020   L4-5   Macular degeneration    Nontoxic multinodular goiter    OA (osteoarthritis)    Postherpetic neuralgia at T3-T5 level 04/27/2011   Rectal bleeding 07/06/2014   Hemorrhoid related in past.      Past Surgical History:  Procedure Laterality Date   ANTERIOR CERVICAL DECOMP/DISCECTOMY FUSION N/A 05/29/2015   Procedure: ANTERIOR CERVICAL DECOMPRESSION/DISCECTOMY FUSION CERVICAL FOUR-FIVE,CERVICAL FIVE-SIX;  Surgeon: Kary Kos, MD;  [facility]: [facility];  Service: Neurosurgery;  Laterality: N/A;   APPENDECTOMY     BREAST LUMPECTOMY Left 06/2018   BREAST LUMPECTOMY WITH RADIOACTIVE SEED LOCALIZATION Left 06/25/2018   Procedure: BREAST LUMPECTOMY WITH RADIOACTIVE SEED LOCALIZATION;  Surgeon: Erroll Luna, MD;  Location: [place];  Service: General;  Laterality: Left;   CARDIAC CATHETERIZATION     CARDIOVERSION  08/29/2011   Procedure: CARDIOVERSION;  Surgeon: Bing Quarry, MD;  Location: [place];  Service: Cardiovascular;  Laterality: N/A;   CARDIOVERSION  11/22/2011   Procedure: CARDIOVERSION;  Surgeon: Coralyn Mark, MD;  Location: [place];  Service: Cardiovascular;  Laterality: N/A;   CHOLECYSTECTOMY  1989   COLONOSCOPY W/ POLYPECTOMY  2005   Neg in 2010; Dr Olevia Perches   ESOPHAGEAL DILATION     > 3 X; Dr Olevia Perches   LEFT AND RIGHT HEART CATHETERIZATION WITH CORONARY ANGIOGRAM N/A 12/02/2011   Procedure: LEFT AND [place];  Surgeon: Jolaine Artist, MD;  Location: [facility] CATH LAB;  Service: Cardiovascular;  Laterality: N/A;   TOTAL ABDOMINAL HYSTERECTOMY  1972   for pain (no BSO)   TUBAL LIGATION     with appendectomy   UPPER GI ENDOSCOPY  2010   H pylori    There were no vitals filed for this visit.   Subjective Assessment - 05/30/21 0852     Subjective I like the exercises.  My Rt leg has less pain.  I am doing the laying down exercises in my bed b/c I tried from the floor and had a hard time getting up.  [place] Adult PT Treatment/Exercise - 05/30/21 0001       Exercises   Exercises Shoulder;Lumbar;Knee/Hip      Lumbar Exercises: Standing   Heel Raises 10 reps    Heel Raises Limitations heel toe raises bil UE support      Knee/Hip Exercises: Machines for Strengthening   Cybex Leg Press 1x20 each at 50lb, 60lb, bil LEs, with ball squeeze      Knee/Hip Exercises: Standing   Hip Abduction Right;Left;10 reps;Knee straight    Abduction Limitations bil UE support    Hip Extension Left;Right;1 set;10 reps;Knee straight    Extension Limitations bil UE support    Gait Training 3' walk test covering 280' with supervision and VC for avoiding scissoring of Rt LE, 60-70% success  with foot placement      Knee/Hip Exercises: Seated   Clamshell with TheraBand Green   15 reps   Sit to [employer] 2 sets;5 reps;with UE support;without UE support   Pt able to demo from chair + black pad w/o UEs using green band and VC/TC for COG over feet and feet apart, heavy cueing by PT     Shoulder Exercises: Seated   Row Strengthening;Both;20 reps;Weights    Row Weight (lbs) 25 power tower                      PT Short Term Goals - 05/30/21 0939       PT SHORT TERM GOAL #1   Title Pt will be independent with initial HEP in order to indicate dec fall risk and improve functional mobility    Status On-going      PT SHORT TERM GOAL #3   Title Pt will participate in 3' walk test with min SOB and LE pain    Status Achieved               PT Long Term Goals - 05/22/21 1615       PT LONG TERM GOAL #1   Title Pt will be independent with final HEP in order to indicate dec fall risk and improve functional mobility.    Time 8    Period Weeks    Status New    Target Date 07/17/21      PT LONG TERM GOAL #2   Title Pt will be able to participate in 6' walk test with min or no LOB and with min or no SOB    Baseline 10 min    Time 8    Period Weeks    Status New    Target Date 07/17/21      PT LONG TERM GOAL #3   Title Pt will achieve LE flexibility to WNL to improve step length and gait quality (reduced scissoring)    Time 8    Period Weeks    Status New    Target Date 07/17/21      PT LONG TERM GOAL #4   Title Improve TUG to </= 12 sec to demo reduced fall risk    Baseline 16    Time 8    Period Weeks    Status New    Target Date 07/17/21      PT LONG TERM GOAL #5   Title Pt will improve 5x sit to stand to </= 16 sec with min or no UE use to demo improved functional strength of bil LEs and reduced fall risk.    Time 8    Period Weeks  Status New    Target Date 07/17/21                   Plan - 05/30/21 0936     Clinical Impression  Statement Pt with excellent tolerance and work rate today.  She was able to complete 3' walk test covering 280' with focus on avoidance of Rt LE scissoring.  She was approx 60-70% successful with this.  She was able with heavy cueing by PT for COG forward and feet under/apart from knees to perform sit to stand from chair + black pad without UEs today.  PT updated HEP for standing LE strength and seated clam to enhance sit to stand collective alignment and strength of hip abductors in closed chain.  Continue along POC with Pt planning to limit visits due to financial concerns.    Comorbidities fall risk, a-fib, bil LE edema, Rt knee pain    PT Frequency 2x / week    PT Duration 8 weeks    PT Treatment/Interventions ADLs/Self Care Home Management;Aquatic Therapy;Joint Manipulations;Spinal Manipulations;Dry needling;Passive range of motion;Gait training;Functional mobility training;Therapeutic activities;Therapeutic exercise;Balance training;Neuromuscular re-education;Moist Heat;Electrical Stimulation;Cryotherapy;Patient/family education;Manual techniques    PT Next Visit Plan continue NuStep, review HEP, leg press, sit to stand success wo UE support, work toward 6' walk test    PT Home Exercise Plan Access Code: [email or web address]    Consulted and Agree with Plan of Care Patient             Patient will benefit from skilled therapeutic intervention in order to improve the following deficits and impairments:     Visit Diagnosis: Radiculopathy, lumbar region  Repeated falls  Unsteadiness on feet  Muscle weakness (generalized)  Other abnormalities of gait and mobility     Problem List Patient Active Problem List   Diagnosis Date Noted   Aortic atherosclerosis ([place]) 05/15/2021   Lumbar spinal stenosis 07/07/2020   Gait abnormality 07/07/2020   Acquired thrombophilia ([place]) 09/01/2019   Polyarthralgia 07/15/2019   Osteoporosis 10/30/2018   Genetic testing 07/02/2018   Malignant neoplasm of  lower-outer quadrant of left breast of female, estrogen receptor positive ([place]) 06/03/2018   Dyspnea on exertion 03/23/2018   Constipation 08/29/2017   Cervical lymphadenopathy 05/16/2016   Insomnia 08/25/2015   Spinal stenosis in cervical region [id number]   History of Helicobacter pylori infection 07/06/2014   Hyperglycemia 06/07/2014   OSA (obstructive sleep apnea) 02/11/2012   Long term (current) use of anticoagulants 07/19/2011   Atrial fibrillation 06/14/2011   Chest pain 06/14/2011   Depression 01/30/2010   History of colonic polyps 01/30/2010   ESOPHAGEAL STRICTURE 07/20/2008   Osteoarthritis 07/20/2008   Multiple thyroid nodules 01/28/2008   Hyperlipidemia 01/28/2008   GERD 01/28/2008    Venetia Night Harkirat Orozco, PT 05/30/21 9:40 AM   [facility] [address], [place], [place], [address] Phone: [phone]   Fax:  [phone]  Name: Bridget Lane MRN: [id number] Date of Birth: 07-01-1933

## 2021-06-04 ENCOUNTER — Ambulatory Visit: Payer: Medicare Other | Admitting: Family Medicine

## 2021-06-04 ENCOUNTER — Other Ambulatory Visit: Payer: Self-pay

## 2021-06-04 ENCOUNTER — Ambulatory Visit: Payer: Self-pay

## 2021-06-04 VITALS — BP 122/72 | HR 54 | Ht 64.0 in | Wt 158.2 lb

## 2021-06-04 DIAGNOSIS — M25561 Pain in right knee: Secondary | ICD-10-CM

## 2021-06-04 DIAGNOSIS — M1711 Unilateral primary osteoarthritis, right knee: Secondary | ICD-10-CM

## 2021-06-04 DIAGNOSIS — G8929 Other chronic pain: Secondary | ICD-10-CM | POA: Diagnosis not present

## 2021-06-04 NOTE — Patient Instructions (Signed)
Thank you for coming in today.   Call or go to the ER if you develop a large red swollen joint with extreme pain or oozing puss.    Please use Voltaren gel (Generic Diclofenac Gel) up to 4x daily for pain as needed.  This is available over-the-counter as both the name brand Voltaren gel and the generic diclofenac gel.   Ok to take tylneol arthritis every 8 hours as needed for pain.   Recheck as needed.   I can do this shot every 3 months.

## 2021-06-04 NOTE — Progress Notes (Signed)
I, Peterson Lombard, LAT, ATC acting as a scribe for [name], MD.  Bridget Lane is a 87 y.o. female who presents to [place] [facility] today for cont R knee pain due to DJD. Pt was last seen by Dr. Georgina Snell on 02/14/21 and was given a R knee steroid injection. Today, pt reports R knee is pretty painful and has been worsening over the past month. Pt locates pain to the anterior aspect. Pt c/o difficulty sleeping due to not being able get comfortable.  Dx imaging: 07/19/20 R & L knee XR                     05/05/20 L-spine MRI  Pertinent review of systems: No fevers or chills  Relevant historical information: Osteoporosis.   Exam:  BP 122/72   Pulse (!) 54   Ht '5\' 4"'$  (1.626 m)   Wt 158 lb 3.2 oz (71.8 kg)   SpO2 97%   BMI 27.15 kg/m  General: Well Developed, well nourished, and in no acute distress.   MSK: Right knee mild effusion normal motion with crepitation.    Lab and Radiology Results  Procedure: Real-time Ultrasound Guided Injection of right knee superior lateral patellar space Device: Philips Affiniti 50G Images permanently stored and available for review in PACS Verbal informed consent obtained.  Discussed risks and benefits of procedure. Warned about infection bleeding damage to structures skin hypopigmentation and fat atrophy among others. Patient expresses understanding and agreement Time-out conducted.   Noted no overlying erythema, induration, or other signs of local infection.   Skin prepped in a sterile fashion.   Local anesthesia: Topical Ethyl chloride.   With sterile technique and under real time ultrasound guidance: 40 mg of Kenalog and 2 mL of Marcaine injected into knee joint. Fluid seen entering the joint capsule.   Completed without difficulty   Pain immediately resolved suggesting accurate placement of the medication.   Advised to call if fevers/chills, erythema, induration, drainage, or persistent bleeding.   Images  permanently stored and available for review in the ultrasound unit.  Impression: Technically successful ultrasound guided injection.    X-ray review images obtained July 19 2020 shows moderate DJD.  Images personally independent interpreted today.     Assessment and Plan: 87 y.o. female with right knee pain due to DJD exacerbation.  Patient has had a series of steroid injections in her right knee last occurring April 27 of this year.  Plan for repeat steroid injection today.  Recommend also quad strengthening and Voltaren gel and extended release acetaminophen.  Certainly happy to repeat the injection as early as 3 months from now.  If this does not last can proceed to hyaluronic acid injections.  Ultimately she may require total knee replacement.   PDMP not reviewed this encounter. Orders Placed This Encounter  Procedures   [place] LIMITED JOINT SPACE STRUCTURES LOW RIGHT(NO LINKED CHARGES)    Standing Status:   Future    Number of Occurrences:   1    Standing Expiration Date:   12/05/2021    Order Specific Question:   Reason for Exam (SYMPTOM  OR DIAGNOSIS REQUIRED)    Answer:   right knee pain    Order Specific Question:   Preferred imaging location?    Answer:   [place]   No orders of the defined types were placed in this encounter.    Discussed warning signs or symptoms. Please see discharge instructions.  Patient expresses understanding.   The above documentation has been reviewed and is accurate and complete [name], M.D.

## 2021-06-05 ENCOUNTER — Telehealth: Payer: Self-pay | Admitting: Physical Therapy

## 2021-06-05 ENCOUNTER — Ambulatory Visit: Payer: Medicare Other | Admitting: Physical Therapy

## 2021-06-05 NOTE — Telephone Encounter (Signed)
Pt was a no-show to PT appointment today, 06/05/21 at 11:00am.  PT left voice mail to return our call.  Will need to review cancellation policy and confirm next appointment.  Braelon Sprung, PT 06/05/21 11:15 AM

## 2021-06-06 ENCOUNTER — Ambulatory Visit: Payer: Medicare Other | Admitting: Physical Therapy

## 2021-06-06 ENCOUNTER — Other Ambulatory Visit: Payer: Self-pay

## 2021-06-06 DIAGNOSIS — R2681 Unsteadiness on feet: Secondary | ICD-10-CM | POA: Diagnosis not present

## 2021-06-06 DIAGNOSIS — R296 Repeated falls: Secondary | ICD-10-CM | POA: Diagnosis not present

## 2021-06-06 DIAGNOSIS — M6281 Muscle weakness (generalized): Secondary | ICD-10-CM

## 2021-06-06 DIAGNOSIS — M5416 Radiculopathy, lumbar region: Secondary | ICD-10-CM

## 2021-06-06 DIAGNOSIS — R2689 Other abnormalities of gait and mobility: Secondary | ICD-10-CM

## 2021-06-06 NOTE — Therapy (Signed)
[facility] [address], [place], [place], [address] Phone: [phone]   Fax:  [phone]  Physical Therapy Treatment  Patient Details  Name: Bridget Lane MRN: [id number] Date of Birth: January 15, 1933 Referring Provider (PT): Meyran, Ocie Cornfield, NP   Encounter Date: 06/06/2021   PT End of Session - 06/06/21 1622     Visit Number 3    Date for PT Re-Evaluation 07/17/21    Authorization Type BCBS Medicare    Progress Note Due on Visit 10    PT Start Time 1617    PT Stop Time 1657    PT Time Calculation (min) 40 min    Activity Tolerance Patient tolerated treatment well    Behavior During Therapy [facility] for tasks assessed/performed             Past Medical History:  Diagnosis Date   Atrial fibrillation ([place])    persistent   Colon polyp 2005   Depression    DJD (degenerative joint disease)    Dysrhythmia    Esophageal stricture    Family history of breast cancer    Family history of esophageal cancer    Family history of prostate cancer    Gait abnormality 07/07/2020   GERD (gastroesophageal reflux disease)    H. pylori infection    Hiatal hernia    Hyperlipidemia    Lumbar spinal stenosis 07/07/2020   L4-5   Macular degeneration    Nontoxic multinodular goiter    OA (osteoarthritis)    Postherpetic neuralgia at T3-T5 level 04/27/2011   Rectal bleeding 07/06/2014   Hemorrhoid related in past.      Past Surgical History:  Procedure Laterality Date   ANTERIOR CERVICAL DECOMP/DISCECTOMY FUSION N/A 05/29/2015   Procedure: ANTERIOR CERVICAL DECOMPRESSION/DISCECTOMY FUSION CERVICAL FOUR-FIVE,CERVICAL FIVE-SIX;  Surgeon: Kary Kos, MD;  Location: [facility];  Service: Neurosurgery;  Laterality: N/A;   APPENDECTOMY     BREAST LUMPECTOMY Left 06/2018   BREAST LUMPECTOMY WITH RADIOACTIVE SEED LOCALIZATION Left 06/25/2018   Procedure: BREAST LUMPECTOMY WITH RADIOACTIVE SEED LOCALIZATION;  Surgeon: Erroll Luna, MD;  Location: [place];  Service: General;  Laterality: Left;   CARDIAC CATHETERIZATION     CARDIOVERSION  08/29/2011   Procedure: CARDIOVERSION;  Surgeon: Bing Quarry, MD;  Location: [place];  Service: Cardiovascular;  Laterality: N/A;   CARDIOVERSION  11/22/2011   Procedure: CARDIOVERSION;  Surgeon: Coralyn Mark, MD;  Location: [place];  Service: Cardiovascular;  Laterality: N/A;   CHOLECYSTECTOMY  1989   COLONOSCOPY W/ POLYPECTOMY  2005   Neg in 2010; Dr Olevia Perches   ESOPHAGEAL DILATION     > 3 X; Dr Olevia Perches   LEFT AND RIGHT HEART CATHETERIZATION WITH CORONARY ANGIOGRAM N/A 12/02/2011   Procedure: LEFT AND [place];  Surgeon: Jolaine Artist, MD;  Location: [facility] CATH LAB;  Service: Cardiovascular;  Laterality: N/A;   TOTAL ABDOMINAL HYSTERECTOMY  1972   for pain (no BSO)   TUBAL LIGATION     with appendectomy   UPPER GI ENDOSCOPY  2010   H pylori    There were no vitals filed for this visit.   Subjective Assessment - 06/06/21 1714     Subjective I have done the exercises and I always feel better when I do them.  They are not too hard and I can feel like I am working.  My knee feels better got injection yesterday    Pertinent History  fall risk, a-fib, gout, Rt knee pain, essential tremor, bil leg edema, Hx of breast cancer    Limitations Standing;Walking    How long can you walk comfortably? unsure but I get winded, SOB limits me more than pain    Diagnostic tests lumbar MRI: severe spinal stenosis L4/5 with Gr I anterolisthesis, severe DDD    Patient Stated Goals learn HEP for arms and legs, walk better    Currently in Pain? No/denies                               [facility] Adult PT Treatment/Exercise - 06/06/21 0001       Ambulation/Gait   Ambulation/Gait Yes    Ambulation/Gait Assistance 5: Supervision    Assistive device None    Gait Pattern Step-through pattern;Scissoring;Trunk flexed;Narrow  base of support    Gait Comments scissoring with Rt>Lt and several times Rt foot catching on the floor; ambulation x 4 min today      Lumbar Exercises: Stretches   Active Hamstring Stretch Right;Left;5 reps    Gastroc Stretch Right;Left;3 reps;10 seconds      Lumbar Exercises: Aerobic   Stationary Bike --    Nustep L3 x 4 min; L4 x 2 min      Lumbar Exercises: Machines for Strengthening   Leg Press bil seat 7; 45# x 15 reps; 60# x 15 reps      Lumbar Exercises: Standing   Heel Raises 20 reps   on foam mat     Knee/Hip Exercises: Standing   Hip Flexion Stengthening;Both;Knee bent;20 reps   tapping step bil UE support - doing quick taps   Hip Abduction Right;Left;10 reps;Knee straight   add 1.5#   Forward Step Up Both;2 sets;10 reps;Hand Hold: 2;Step Height: 6";Hand Hold: 1;Step Height: 4"      Knee/Hip Exercises: Seated   Long Arc Quad Strengthening;Both;10 reps   1.5#   Clamshell with TheraBand --   yellow loop - 2 x 20                     PT Short Term Goals - 06/06/21 1713       PT SHORT TERM GOAL #1   Title Pt will be independent with initial HEP in order to indicate dec fall risk and improve functional mobility    Status Achieved               PT Long Term Goals - 05/22/21 1615       PT LONG TERM GOAL #1   Title Pt will be independent with final HEP in order to indicate dec fall risk and improve functional mobility.    Time 8    Period Weeks    Status New    Target Date 07/17/21      PT LONG TERM GOAL #2   Title Pt will be able to participate in 6' walk test with min or no LOB and with min or no SOB    Baseline 10 min    Time 8    Period Weeks    Status New    Target Date 07/17/21      PT LONG TERM GOAL #3   Title Pt will achieve LE flexibility to WNL to improve step length and gait quality (reduced scissoring)    Time 8    Period Weeks    Status New    Target Date 07/17/21  PT LONG TERM GOAL #4   Title Improve TUG to </= 12 sec  to demo reduced fall risk    Baseline 16    Time 8    Period Weeks    Status New    Target Date 07/17/21      PT LONG TERM GOAL #5   Title Pt will improve 5x sit to stand to </= 16 sec with min or no UE use to demo improved functional strength of bil LEs and reduced fall risk.    Time 8    Period Weeks    Status New    Target Date 07/17/21                   Plan - 06/06/21 1710     Clinical Impression Statement Pt did well with exercise tolerance . Pt needed very few rest breaks and was eager to begin next exercise throughout.  Pt demonstrated scissoring on the way out at the end of therapy needing cues to keep feet apart.  She does well with cues to remind her, but she demonstrated fatigue at the end of 4 min walk today with foot catching the floor more frequently.  Pt was able to add resistance to ex's.  She is recommended to continue to work on improved strength and gait.    PT Treatment/Interventions ADLs/Self Care Home Management;Aquatic Therapy;Joint Manipulations;Spinal Manipulations;Dry needling;Passive range of motion;Gait training;Functional mobility training;Therapeutic activities;Therapeutic exercise;Balance training;Neuromuscular re-education;Moist Heat;Electrical Stimulation;Cryotherapy;Patient/family education;Manual techniques    PT Next Visit Plan continue NuStep, review and add to HEP as able, leg press, sit to stand success wo UE support, work toward 6' walk test    PT Home Exercise Plan Access Code: [email or web address]    Consulted and Agree with Plan of Care Patient             Patient will benefit from skilled therapeutic intervention in order to improve the following deficits and impairments:  Abnormal gait, Decreased range of motion, Pain, Postural dysfunction, Increased edema, Decreased strength, Impaired flexibility, Hypomobility, Decreased balance, Decreased activity tolerance, Cardiopulmonary status limiting activity, Decreased endurance  Visit  Diagnosis: Radiculopathy, lumbar region  Repeated falls  Unsteadiness on feet  Muscle weakness (generalized)  Other abnormalities of gait and mobility     Problem List Patient Active Problem List   Diagnosis Date Noted   Aortic atherosclerosis ([place]) 05/15/2021   Lumbar spinal stenosis 07/07/2020   Gait abnormality 07/07/2020   Acquired thrombophilia ([place]) 09/01/2019   Polyarthralgia 07/15/2019   Osteoporosis 10/30/2018   Genetic testing 07/02/2018   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, estrogen receptor positive ([place]) 06/03/2018   Dyspnea on exertion 03/23/2018   Constipation 08/29/2017   Cervical lymphadenopathy 05/16/2016   Insomnia 08/25/2015   Spinal stenosis in cervical region [id number]   History of Helicobacter pylori infection 07/06/2014   Hyperglycemia 06/07/2014   OSA (obstructive sleep apnea) 02/11/2012   Long term (current) use of anticoagulants 07/19/2011   Atrial fibrillation 06/14/2011   Chest pain 06/14/2011   Depression 01/30/2010   History of colonic polyps 01/30/2010   ESOPHAGEAL STRICTURE 07/20/2008   Osteoarthritis 07/20/2008   Multiple thyroid nodules 01/28/2008   Hyperlipidemia 01/28/2008   GERD 01/28/2008    Camillo Flaming Boaz Berisha, PT 06/06/2021, 5:15 PM  [facility] [address], [place], [place], [address] Phone: [phone]   Fax:  [phone]  Name: Bridget Lane MRN: [id number] Date of Birth: 11-18-1932

## 2021-06-12 ENCOUNTER — Ambulatory Visit: Payer: Medicare Other | Admitting: Physical Therapy

## 2021-06-12 ENCOUNTER — Encounter: Payer: Self-pay | Admitting: Physical Therapy

## 2021-06-12 ENCOUNTER — Other Ambulatory Visit: Payer: Self-pay

## 2021-06-12 DIAGNOSIS — R2681 Unsteadiness on feet: Secondary | ICD-10-CM | POA: Diagnosis not present

## 2021-06-12 DIAGNOSIS — R2689 Other abnormalities of gait and mobility: Secondary | ICD-10-CM | POA: Diagnosis not present

## 2021-06-12 DIAGNOSIS — R296 Repeated falls: Secondary | ICD-10-CM

## 2021-06-12 DIAGNOSIS — M5416 Radiculopathy, lumbar region: Secondary | ICD-10-CM | POA: Diagnosis not present

## 2021-06-12 DIAGNOSIS — M6281 Muscle weakness (generalized): Secondary | ICD-10-CM | POA: Diagnosis not present

## 2021-06-12 NOTE — Therapy (Addendum)
[facility] [address] [place], [place], [address] Phone: [phone]   Fax:  [phone]  Physical Therapy Treatment  Patient Details  Name: Bridget Lane MRN: [id number] Date of Birth: 04-17-1933 Referring Provider (PT): Meyran, Ocie Cornfield, NP   Encounter Date: 06/12/2021   PT End of Session - 06/12/21 1054     Visit Number 4    Date for PT Re-Evaluation 07/17/21    Authorization Type BCBS Medicare    Progress Note Due on Visit 10    PT Start Time 1057    PT Stop Time 1112   short visit due to Pt becoming ill within session   PT Time Calculation (min) 15 min    Activity Tolerance Patient tolerated treatment well    Behavior During Therapy [facility] for tasks assessed/performed             Past Medical History:  Diagnosis Date   Atrial fibrillation ([place])    persistent   Colon polyp 2005   Depression    DJD (degenerative joint disease)    Dysrhythmia    Esophageal stricture    Family history of breast cancer    Family history of esophageal cancer    Family history of prostate cancer    Gait abnormality 07/07/2020   GERD (gastroesophageal reflux disease)    H. pylori infection    Hiatal hernia    Hyperlipidemia    Lumbar spinal stenosis 07/07/2020   L4-5   Macular degeneration    Nontoxic multinodular goiter    OA (osteoarthritis)    Postherpetic neuralgia at T3-T5 level 04/27/2011   Rectal bleeding 07/06/2014   Hemorrhoid related in past.      Past Surgical History:  Procedure Laterality Date   ANTERIOR CERVICAL DECOMP/DISCECTOMY FUSION N/A 05/29/2015   Procedure: ANTERIOR CERVICAL DECOMPRESSION/DISCECTOMY FUSION CERVICAL FOUR-FIVE,CERVICAL FIVE-SIX;  Surgeon: Kary Kos, MD;  Location: [facility];  Service: Neurosurgery;  Laterality: N/A;   APPENDECTOMY     BREAST LUMPECTOMY Left 06/2018   BREAST LUMPECTOMY WITH RADIOACTIVE SEED LOCALIZATION Left 06/25/2018   Procedure: BREAST LUMPECTOMY WITH  RADIOACTIVE SEED LOCALIZATION;  Surgeon: Erroll Luna, MD;  Location: [place];  Service: General;  Laterality: Left;   CARDIAC CATHETERIZATION     CARDIOVERSION  08/29/2011   Procedure: CARDIOVERSION;  Surgeon: Bing Quarry, MD;  Location: [place];  Service: Cardiovascular;  Laterality: N/A;   CARDIOVERSION  11/22/2011   Procedure: CARDIOVERSION;  Surgeon: Coralyn Mark, MD;  Location: [place];  Service: Cardiovascular;  Laterality: N/A;   CHOLECYSTECTOMY  1989   COLONOSCOPY W/ POLYPECTOMY  2005   Neg in 2010; Dr Olevia Perches   ESOPHAGEAL DILATION     > 3 X; Dr Olevia Perches   LEFT AND RIGHT HEART CATHETERIZATION WITH CORONARY ANGIOGRAM N/A 12/02/2011   Procedure: LEFT AND [place];  Surgeon: Jolaine Artist, MD;  Location: [facility] CATH LAB;  Service: Cardiovascular;  Laterality: N/A;   TOTAL ABDOMINAL HYSTERECTOMY  1972   for pain (no BSO)   TUBAL LIGATION     with appendectomy   UPPER GI ENDOSCOPY  2010   H pylori    There were no vitals filed for this visit.   Subjective Assessment - 06/12/21 1054     Subjective I am doing the exercises.  I am getting stronger.  I feel a litte weak in my chest today.    Pertinent History fall risk, a-fib, gout, Rt knee  pain, essential tremor, bil leg edema, Hx of breast cancer    Limitations Standing;Walking    How long can you walk comfortably? unsure but I get winded, SOB limits me more than pain    Diagnostic tests lumbar MRI: severe spinal stenosis L4/5 with Gr I anterolisthesis, severe DDD    Patient Stated Goals learn HEP for arms and legs, walk better    Currently in Pain? No/denies                               [facility] Adult PT Treatment/Exercise - 06/12/21 0001       Exercises   Exercises Shoulder;Lumbar;Knee/Hip      Lumbar Exercises: Standing   Heel Raises 20 reps   on foam pad     Knee/Hip Exercises: Standing   Hip Flexion Stengthening;Both;20 reps;Knee bent     Hip Flexion Limitations march taps edge of TM    Hip Abduction Right;Left;Stengthening;2 sets;10 reps;Knee straight    Abduction Limitations 1.5lb      Knee/Hip Exercises: Seated   Long Arc Quad Strengthening;Both;10 reps;Weights;2 sets;15 reps   1x10, 1x15   Long Arc Quad Weight --   1.5lb                     PT Short Term Goals - 06/06/21 1713       PT SHORT TERM GOAL #1   Title Pt will be independent with initial HEP in order to indicate dec fall risk and improve functional mobility    Status Achieved               PT Long Term Goals - 05/22/21 1615       PT LONG TERM GOAL #1   Title Pt will be independent with final HEP in order to indicate dec fall risk and improve functional mobility.    Time 8    Period Weeks    Status New    Target Date 07/17/21      PT LONG TERM GOAL #2   Title Pt will be able to participate in 6' walk test with min or no LOB and with min or no SOB    Baseline 10 min    Time 8    Period Weeks    Status New    Target Date 07/17/21      PT LONG TERM GOAL #3   Title Pt will achieve LE flexibility to WNL to improve step length and gait quality (reduced scissoring)    Time 8    Period Weeks    Status New    Target Date 07/17/21      PT LONG TERM GOAL #4   Title Improve TUG to </= 12 sec to demo reduced fall risk    Baseline 16    Time 8    Period Weeks    Status New    Target Date 07/17/21      PT LONG TERM GOAL #5   Title Pt will improve 5x sit to stand to </= 16 sec with min or no UE use to demo improved functional strength of bil LEs and reduced fall risk.    Time 8    Period Weeks    Status New    Target Date 07/17/21                   Plan - 06/12/21 1124  Clinical Impression Statement Pt has been compliant and demonstrated good motivation to work both at home and within sessions.  She arrived with report of feeling "weakness" in her chest but participated in seated and standing LE ther ex x 15 min  before reporting feeling ill.  She vomited and went home early from her visit.   Pt will be traveling to visit out of town family for an unknown period of time but wishes to resume PT when she gets back.  PT placing Pt on hold until we hear she is back.  PT encouraged Pt to contact doctor with any concerning symptoms over becoming ill this morning at PT.    Comorbidities fall risk, a-fib, bil LE edema, Rt knee pain    PT Frequency 2x / week    PT Duration 8 weeks    PT Treatment/Interventions ADLs/Self Care Home Management;Aquatic Therapy;Joint Manipulations;Spinal Manipulations;Dry needling;Passive range of motion;Gait training;Functional mobility training;Therapeutic activities;Therapeutic exercise;Balance training;Neuromuscular re-education;Moist Heat;Electrical Stimulation;Cryotherapy;Patient/family education;Manual techniques    PT Next Visit Plan placing Pt on hold due to upcoming extended out of town period, Pt will call to resume PT when she gets back in town, may need ERO if beyond cert date of 3/54/56    PT Home Exercise Plan Access Code: [email or web address]    Consulted and Agree with Plan of Care Patient             Patient will benefit from skilled therapeutic intervention in order to improve the following deficits and impairments:     Visit Diagnosis: Radiculopathy, lumbar region  Repeated falls  Unsteadiness on feet  Muscle weakness (generalized)  Other abnormalities of gait and mobility     Problem List Patient Active Problem List   Diagnosis Date Noted   Aortic atherosclerosis ([place]) 05/15/2021   Lumbar spinal stenosis 07/07/2020   Gait abnormality 07/07/2020   Acquired thrombophilia ([place]) 09/01/2019   Polyarthralgia 07/15/2019   Osteoporosis 10/30/2018   Genetic testing 07/02/2018   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, estrogen receptor positive ([place]) 06/03/2018   Dyspnea on exertion 03/23/2018   Constipation 08/29/2017   Cervical  lymphadenopathy 05/16/2016   Insomnia 08/25/2015   Spinal stenosis in cervical region 25/63/8937   History of Helicobacter pylori infection 07/06/2014   Hyperglycemia 06/07/2014   OSA (obstructive sleep apnea) 02/11/2012   Long term (current) use of anticoagulants 07/19/2011   Atrial fibrillation 06/14/2011   Chest pain 06/14/2011   Depression 01/30/2010   History of colonic polyps 01/30/2010   ESOPHAGEAL STRICTURE 07/20/2008   Osteoarthritis 07/20/2008   Multiple thyroid nodules 01/28/2008   Hyperlipidemia 01/28/2008   GERD 01/28/2008    Sanoe Hazan, PT 06/12/21 11:28 AM  PHYSICAL THERAPY DISCHARGE SUMMARY  Visits from Start of Care: 4  Current functional level related to goals / functional outcomes: Pt had been placed on hold due to going out of town but never returned to clinic.   Remaining deficits: See above   Education / Equipment: Initial HEP   Patient agrees to discharge. Patient goals were partially met. Patient is being discharged due to not returning since the last visit.  Venetia Night Deanglo Hissong, PT 12/26/21 8:57 AM  [facility] [address] [place], [place], [address] Phone: [phone]   Fax:  [phone]  Name: KADASIA KASSING MRN: [id number] Date of Birth: September 11, 1933

## 2021-06-19 ENCOUNTER — Encounter: Payer: Medicare Other | Admitting: Physical Therapy

## 2021-06-21 ENCOUNTER — Other Ambulatory Visit: Payer: Self-pay | Admitting: *Deleted

## 2021-06-21 ENCOUNTER — Other Ambulatory Visit: Payer: Self-pay | Admitting: Hematology and Oncology

## 2021-06-21 DIAGNOSIS — C50512 Malignant neoplasm of lower-outer quadrant of left female breast: Secondary | ICD-10-CM

## 2021-06-21 DIAGNOSIS — Z170 Estrogen receptor positive status [ER+]: Secondary | ICD-10-CM

## 2021-06-22 ENCOUNTER — Other Ambulatory Visit: Payer: Self-pay | Admitting: Hematology and Oncology

## 2021-06-22 DIAGNOSIS — C50512 Malignant neoplasm of lower-outer quadrant of left female breast: Secondary | ICD-10-CM

## 2021-06-26 ENCOUNTER — Encounter: Payer: Medicare Other | Admitting: Physical Therapy

## 2021-06-27 ENCOUNTER — Other Ambulatory Visit: Payer: Self-pay | Admitting: Cardiovascular Disease

## 2021-06-27 ENCOUNTER — Other Ambulatory Visit: Payer: Self-pay | Admitting: Hematology and Oncology

## 2021-06-27 DIAGNOSIS — C50512 Malignant neoplasm of lower-outer quadrant of left female breast: Secondary | ICD-10-CM

## 2021-06-27 DIAGNOSIS — Z170 Estrogen receptor positive status [ER+]: Secondary | ICD-10-CM

## 2021-06-27 DIAGNOSIS — N644 Mastodynia: Secondary | ICD-10-CM

## 2021-06-28 ENCOUNTER — Ambulatory Visit
Admission: RE | Admit: 2021-06-28 | Discharge: 2021-06-28 | Disposition: A | Discharge status: Home / Self Care | Payer: Medicare Other | Source: Ambulatory Visit | Attending: Hematology and Oncology | Admitting: Hematology and Oncology

## 2021-06-28 ENCOUNTER — Ambulatory Visit: Payer: Medicare Other

## 2021-06-28 ENCOUNTER — Other Ambulatory Visit: Payer: Self-pay

## 2021-06-28 DIAGNOSIS — N644 Mastodynia: Secondary | ICD-10-CM | POA: Diagnosis not present

## 2021-06-28 DIAGNOSIS — R922 Inconclusive mammogram: Secondary | ICD-10-CM | POA: Diagnosis not present

## 2021-06-28 DIAGNOSIS — Z170 Estrogen receptor positive status [ER+]: Secondary | ICD-10-CM

## 2021-06-28 DIAGNOSIS — C50512 Malignant neoplasm of lower-outer quadrant of left female breast: Secondary | ICD-10-CM

## 2021-07-03 ENCOUNTER — Encounter: Payer: Medicare Other | Admitting: Physical Therapy

## 2021-07-12 ENCOUNTER — Other Ambulatory Visit: Payer: Self-pay | Admitting: Family Medicine

## 2021-07-26 ENCOUNTER — Telehealth: Payer: Self-pay | Admitting: Family Medicine

## 2021-07-26 NOTE — Chronic Care Management (AMB) (Signed)
  Care Management   Follow Up Note   07/26/2021 Name: Bridget Lane MRN: [id number] DOB: Apr 27, 1933   Referred by: [name], MD Reason for referral : No chief complaint on file.   An unsuccessful telephone outreach was attempted today. The patient was referred to the case management team for assistance with care management and care coordination.   Follow Up Plan: No further follow up required:    [place]

## 2021-07-26 NOTE — Chronic Care Management (AMB) (Signed)
  Care Management  Note   07/26/2021 Name: Bridget Lane MRN: [id number] DOB: 1933/02/09  Bridget Lane is a 87 y.o. year old female who is a primary care patient of Yong Channel, Brayton Mars, MD. The care management team was consulted for assistance with chronic disease management and care coordination needs.   [name] was given information about Care Management services today including:  CCM service includes personalized support from designated clinical staff supervised by the physician, including individualized plan of care and coordination with other care providers 24/7 contact phone numbers for assistance for urgent and routine care needs. Service will only be billed when office clinical staff spend 20 minutes or more in a month to coordinate care. Only one practitioner may furnish and bill the service in a calendar month. The patient may stop CCM services at amy time (effective at the end of the month) by phone call to the office staff. The patient will be responsible for cost sharing (co-pay) or up to 20% of the service fee (after annual deductible is met)  Patient agreed to services and verbal consent obtained.  Follow up plan:   An initial telephone outreach has been scheduled for: 09/18/21 $[email or web address]$   [employer]

## 2021-08-15 ENCOUNTER — Observation Stay (HOSPITAL_COMMUNITY)
Admission: EM | Admit: 2021-08-15 | Discharge: 2021-08-17 | Disposition: A | Discharge status: Home / Self Care | Payer: Medicare Other | Attending: Family Medicine | Admitting: Family Medicine

## 2021-08-15 ENCOUNTER — Encounter (HOSPITAL_COMMUNITY): Payer: Self-pay

## 2021-08-15 ENCOUNTER — Emergency Department (HOSPITAL_COMMUNITY): Payer: Medicare Other

## 2021-08-15 ENCOUNTER — Encounter (HOSPITAL_COMMUNITY): Payer: Self-pay | Admitting: Internal Medicine

## 2021-08-15 ENCOUNTER — Other Ambulatory Visit: Payer: Self-pay

## 2021-08-15 DIAGNOSIS — W010XXA Fall on same level from slipping, tripping and stumbling without subsequent striking against object, initial encounter: Secondary | ICD-10-CM | POA: Diagnosis not present

## 2021-08-15 DIAGNOSIS — Z23 Encounter for immunization: Secondary | ICD-10-CM | POA: Insufficient documentation

## 2021-08-15 DIAGNOSIS — I129 Hypertensive chronic kidney disease with stage 1 through stage 4 chronic kidney disease, or unspecified chronic kidney disease: Secondary | ICD-10-CM | POA: Diagnosis not present

## 2021-08-15 DIAGNOSIS — Z7901 Long term (current) use of anticoagulants: Secondary | ICD-10-CM | POA: Insufficient documentation

## 2021-08-15 DIAGNOSIS — Z853 Personal history of malignant neoplasm of breast: Secondary | ICD-10-CM | POA: Insufficient documentation

## 2021-08-15 DIAGNOSIS — R2681 Unsteadiness on feet: Secondary | ICD-10-CM | POA: Diagnosis not present

## 2021-08-15 DIAGNOSIS — Y900 Blood alcohol level of less than 20 mg/100 ml: Secondary | ICD-10-CM | POA: Diagnosis not present

## 2021-08-15 DIAGNOSIS — I959 Hypotension, unspecified: Secondary | ICD-10-CM | POA: Diagnosis present

## 2021-08-15 DIAGNOSIS — N179 Acute kidney failure, unspecified: Secondary | ICD-10-CM | POA: Diagnosis present

## 2021-08-15 DIAGNOSIS — D631 Anemia in chronic kidney disease: Secondary | ICD-10-CM | POA: Diagnosis not present

## 2021-08-15 DIAGNOSIS — F03918 Unspecified dementia, unspecified severity, with other behavioral disturbance: Secondary | ICD-10-CM | POA: Diagnosis not present

## 2021-08-15 DIAGNOSIS — S022XXA Fracture of nasal bones, initial encounter for closed fracture: Secondary | ICD-10-CM | POA: Diagnosis not present

## 2021-08-15 DIAGNOSIS — R55 Syncope and collapse: Secondary | ICD-10-CM | POA: Diagnosis present

## 2021-08-15 DIAGNOSIS — I739 Peripheral vascular disease, unspecified: Secondary | ICD-10-CM | POA: Diagnosis present

## 2021-08-15 DIAGNOSIS — W19XXXA Unspecified fall, initial encounter: Secondary | ICD-10-CM | POA: Diagnosis not present

## 2021-08-15 DIAGNOSIS — R5383 Other fatigue: Secondary | ICD-10-CM | POA: Diagnosis present

## 2021-08-15 DIAGNOSIS — I4891 Unspecified atrial fibrillation: Secondary | ICD-10-CM | POA: Diagnosis present

## 2021-08-15 DIAGNOSIS — S0181XA Laceration without foreign body of other part of head, initial encounter: Principal | ICD-10-CM | POA: Insufficient documentation

## 2021-08-15 DIAGNOSIS — Z79899 Other long term (current) drug therapy: Secondary | ICD-10-CM | POA: Insufficient documentation

## 2021-08-15 DIAGNOSIS — E861 Hypovolemia: Secondary | ICD-10-CM

## 2021-08-15 DIAGNOSIS — Z20822 Contact with and (suspected) exposure to covid-19: Secondary | ICD-10-CM | POA: Insufficient documentation

## 2021-08-15 DIAGNOSIS — T1490XA Injury, unspecified, initial encounter: Secondary | ICD-10-CM

## 2021-08-15 DIAGNOSIS — I9589 Other hypotension: Secondary | ICD-10-CM

## 2021-08-15 DIAGNOSIS — R443 Hallucinations, unspecified: Secondary | ICD-10-CM | POA: Diagnosis present

## 2021-08-15 DIAGNOSIS — N1831 Chronic kidney disease, stage 3a: Secondary | ICD-10-CM | POA: Insufficient documentation

## 2021-08-15 DIAGNOSIS — I480 Paroxysmal atrial fibrillation: Secondary | ICD-10-CM

## 2021-08-15 DIAGNOSIS — Z043 Encounter for examination and observation following other accident: Secondary | ICD-10-CM | POA: Diagnosis not present

## 2021-08-15 DIAGNOSIS — M47812 Spondylosis without myelopathy or radiculopathy, cervical region: Secondary | ICD-10-CM | POA: Diagnosis not present

## 2021-08-15 DIAGNOSIS — M79641 Pain in right hand: Secondary | ICD-10-CM | POA: Diagnosis not present

## 2021-08-15 DIAGNOSIS — R001 Bradycardia, unspecified: Secondary | ICD-10-CM | POA: Diagnosis not present

## 2021-08-15 HISTORY — DX: Unspecified dementia, unspecified severity, without behavioral disturbance, psychotic disturbance, mood disturbance, and anxiety: F03.90

## 2021-08-15 HISTORY — DX: Cardiac arrhythmia, unspecified: I49.9

## 2021-08-15 HISTORY — DX: Acute kidney failure, unspecified: N17.9

## 2021-08-15 LAB — I-STAT CHEM 8, ED
BUN: 31 mg/dL — ABNORMAL HIGH (ref 8–23)
Calcium, Ion: 1.21 mmol/L (ref 1.15–1.40)
Chloride: 102 mmol/L (ref 98–111)
Creatinine, Ser: 1.8 mg/dL — ABNORMAL HIGH (ref 0.44–1.00)
Glucose, Bld: 103 mg/dL — ABNORMAL HIGH (ref 70–99)
HCT: 35 % — ABNORMAL LOW (ref 36.0–46.0)
Hemoglobin: 11.9 g/dL — ABNORMAL LOW (ref 12.0–15.0)
Potassium: 4.2 mmol/L (ref 3.5–5.1)
Sodium: 140 mmol/L (ref 135–145)
TCO2: 27 mmol/L (ref 22–32)

## 2021-08-15 LAB — CBC WITH DIFFERENTIAL/PLATELET
Abs Immature Granulocytes: 0.03 10*3/uL (ref 0.00–0.07)
Basophils Absolute: 0.1 10*3/uL (ref 0.0–0.1)
Basophils Relative: 1 %
Eosinophils Absolute: 0.2 10*3/uL (ref 0.0–0.5)
Eosinophils Relative: 3 %
HCT: 36.1 % (ref 36.0–46.0)
Hemoglobin: 11.4 g/dL — ABNORMAL LOW (ref 12.0–15.0)
Immature Granulocytes: 1 %
Lymphocytes Relative: 32 %
Lymphs Abs: 2 10*3/uL (ref 0.7–4.0)
MCH: 29.1 pg (ref 26.0–34.0)
MCHC: 31.6 g/dL (ref 30.0–36.0)
MCV: 92.1 fL (ref 80.0–100.0)
Monocytes Absolute: 0.5 10*3/uL (ref 0.1–1.0)
Monocytes Relative: 8 %
Neutro Abs: 3.5 10*3/uL (ref 1.7–7.7)
Neutrophils Relative %: 55 %
Platelets: 292 10*3/uL (ref 150–400)
RBC: 3.92 MIL/uL (ref 3.87–5.11)
RDW: 13.6 % (ref 11.5–15.5)
WBC: 6.4 10*3/uL (ref 4.0–10.5)
nRBC: 0 % (ref 0.0–0.2)

## 2021-08-15 LAB — URINALYSIS, ROUTINE W REFLEX MICROSCOPIC
Bacteria, UA: NONE SEEN
Bilirubin Urine: NEGATIVE
Glucose, UA: NEGATIVE mg/dL
Hgb urine dipstick: NEGATIVE
Ketones, ur: NEGATIVE mg/dL
Nitrite: NEGATIVE
Protein, ur: NEGATIVE mg/dL
Specific Gravity, Urine: 1.008 (ref 1.005–1.030)
pH: 6 (ref 5.0–8.0)

## 2021-08-15 LAB — COMPREHENSIVE METABOLIC PANEL
ALT: 16 U/L (ref 0–44)
AST: 23 U/L (ref 15–41)
Albumin: 3.5 g/dL (ref 3.5–5.0)
Alkaline Phosphatase: 76 U/L (ref 38–126)
Anion gap: 9 (ref 5–15)
BUN: 30 mg/dL — ABNORMAL HIGH (ref 8–23)
CO2: 27 mmol/L (ref 22–32)
Calcium: 9.4 mg/dL (ref 8.9–10.3)
Chloride: 102 mmol/L (ref 98–111)
Creatinine, Ser: 1.7 mg/dL — ABNORMAL HIGH (ref 0.44–1.00)
GFR, Estimated: 29 mL/min — ABNORMAL LOW (ref >60)
Glucose, Bld: 107 mg/dL — ABNORMAL HIGH (ref 70–99)
Potassium: 4.2 mmol/L (ref 3.5–5.1)
Sodium: 138 mmol/L (ref 135–145)
Total Bilirubin: 0.9 mg/dL (ref 0.3–1.2)
Total Protein: 6.3 g/dL — ABNORMAL LOW (ref 6.5–8.1)

## 2021-08-15 LAB — RESP PANEL BY RT-PCR (FLU A&B, COVID) ARPGX2
Influenza A by PCR: NEGATIVE
Influenza B by PCR: NEGATIVE
SARS Coronavirus 2 by RT PCR: NEGATIVE

## 2021-08-15 LAB — LACTIC ACID, PLASMA: Lactic Acid, Venous: 1.6 mmol/L (ref 0.5–1.9)

## 2021-08-15 LAB — ETHANOL: Alcohol, Ethyl (B): <10 mg/dL (ref <10)

## 2021-08-15 LAB — CK: Total CK: 86 U/L (ref 38–234)

## 2021-08-15 LAB — PROTIME-INR
INR: 1.3 — ABNORMAL HIGH (ref 0.8–1.2)
Prothrombin Time: 16.1 seconds — ABNORMAL HIGH (ref 11.4–15.2)

## 2021-08-15 LAB — AMMONIA: Ammonia: 19 umol/L (ref 9–35)

## 2021-08-15 LAB — TROPONIN I (HIGH SENSITIVITY): Troponin I (High Sensitivity): 11 ng/L (ref <18)

## 2021-08-15 LAB — D-DIMER, QUANTITATIVE: D-Dimer, Quant: 0.56 ug/mL-FEU — ABNORMAL HIGH (ref 0.00–0.50)

## 2021-08-15 LAB — MAGNESIUM: Magnesium: 1.8 mg/dL (ref 1.7–2.4)

## 2021-08-15 LAB — PHOSPHORUS: Phosphorus: 3.6 mg/dL (ref 2.5–4.6)

## 2021-08-15 MED ORDER — ACETAMINOPHEN 650 MG RE SUPP: 650.0000 mg | Freq: Four times a day (QID) | RECTAL | Status: DC | PRN

## 2021-08-15 MED ORDER — SODIUM CHLORIDE 0.9 % IV BOLUS
500.0000 mL | Freq: Once | INTRAVENOUS | Status: AC
  Administered 2021-08-15: 500 mL via INTRAVENOUS

## 2021-08-15 MED ORDER — LETROZOLE 2.5 MG PO TABS
2.5000 mg | ORAL_TABLET | Freq: Every day | ORAL | Status: DC
  Administered 2021-08-16 – 2021-08-17 (×2): 2.5 mg via ORAL
  Filled 2021-08-15 (×2): qty 1

## 2021-08-15 MED ORDER — LIDOCAINE HCL (PF) 1 % IJ SOLN
5.0000 mL | Freq: Once | INTRAMUSCULAR | Status: AC
  Administered 2021-08-15: 5 mL via INTRADERMAL
  Filled 2021-08-15: qty 5

## 2021-08-15 MED ORDER — SODIUM CHLORIDE 0.9 % IV SOLN
75.0000 mL/h | INTRAVENOUS | Status: AC
  Administered 2021-08-15: 75 mL/h via INTRAVENOUS

## 2021-08-15 MED ORDER — SODIUM CHLORIDE 0.9 % IV BOLUS
1000.0000 mL | Freq: Once | INTRAVENOUS | Status: AC
  Administered 2021-08-15: 1000 mL via INTRAVENOUS

## 2021-08-15 MED ORDER — ACETAMINOPHEN 325 MG PO TABS
650.0000 mg | ORAL_TABLET | Freq: Four times a day (QID) | ORAL | Status: DC | PRN
  Administered 2021-08-16 (×2): 650 mg via ORAL
  Filled 2021-08-15 (×2): qty 2

## 2021-08-15 MED ORDER — PANTOPRAZOLE SODIUM 40 MG PO TBEC
40.0000 mg | DELAYED_RELEASE_TABLET | Freq: Every day | ORAL | Status: DC
  Administered 2021-08-16 – 2021-08-17 (×2): 40 mg via ORAL
  Filled 2021-08-15 (×2): qty 1

## 2021-08-15 NOTE — ED Notes (Signed)
PA placed 3 sutures to the chin and dermabond on the bridge of pt nose.

## 2021-08-15 NOTE — ED Triage Notes (Signed)
Pt BIB EMS from the [facility] for an unwitnessed fall on blood thinners. Staff at [facility] stated that video recording shows pt fell prone but saw no cause for fall. Pt is on Eliquis for a hx of AFIB. Pt sustained lacerations to her chin and nose.   VSS with EMS except for a 1st degree heart block. EMS states that pt was A&Ox3 and was not oriented to time nor to the event.

## 2021-08-15 NOTE — Progress Notes (Signed)
Asked to evaluate after fall in hallway; arrived to find pt, trying to get up, encouraged to stay seated, assisted to sit with back to wall; reports she doesn't know what happened, bleeding from nasal bridge, chin and lower lip. Ice and pressure applied to all 3 areas, not able to report how/why she fell, 911 called; stayed with pt, applying pressure and ice to abrasions until EMS arrival; report to EMTs, she is not able to recall day of week (thinks it is Monday), month, president; advised to transport to ER. Did not take VS as was trying to control bleeding, but BP taken by EMS 94/60.

## 2021-08-15 NOTE — Progress Notes (Signed)
Orthopedic Tech Progress Note Patient Details:  Bridget Lane 11/07/1932 [id number] Level 2 Trauma  Patient ID: Bridget Lane, female   DOB: Nov 11, 1932, 87 y.o.   MRN: [id number]  Bridget Lane 08/15/2021, 3:24 PM

## 2021-08-15 NOTE — H&P (Addendum)
Bridget Lane EHM:[id number] DOB: 01-Nov-1932 DOA: 08/15/2021     PCP: [name], MD   Outpatient Specialists:  CARDS:   Dr.[name]    Oncology   [place]     Patient arrived to ER on 08/15/21 at 1510 Referred by Attending Truddie Hidden, MD   Patient coming from: home Lives alone,     Chief Complaint:  Chief Complaint  Patient presents with   Fall    HPI: Bridget Lane is a 87 y.o. female with medical history significant of Breast ca, A.fib    Presented with  fall from [facility] no cause sudden fall prone, no attempt to brace self Possible syncope On Eliquis for A.fib Seems a bit confused, reports possible hallucinations Reports hx of Breast ca sp lumpectomy Does not smoke Reports sometimes she sees things that are not there and she feels it is because she is close to spiritual world but at time she gets more confused and sees things more  Does not smoke or drink and left her husband to live independently No fever or chills but her legs have been more swollen lately  Family states she has episodes of confusion and memory loss at times but has not been given diagnosis of Dementia Possible Bipolar but never diagnosed Pt recently had a airplane travel to see her other Daughter and had less PO intake while travelling   She have had trouble with walking for the past 1 year Has been perseverating and obsesses Has  been vaccinated against COVID    Initial COVID TEST  NEGATIVE   Lab Results  Component Value Date   [place] 08/15/2021   Regarding pertinent Chronic problems:      HTN on Metoprolol     A. Fib -  - CHA2DS2 vas score  >3    current  on anticoagulation with  Eliquis,          -  Rate control:  Currently controlled with  Metoprolol,         - Rhythm control:  flecainide   CKD stage IIIa- baseline Cr 1.3 Estimated Creatinine Clearance: 21.5 mL/min (A) (by C-G formula based on SCr of 1.8 mg/dL (H)).  Lab Results   Component Value Date   CREATININE 1.80 (H) 08/15/2021   CREATININE 1.70 (H) 08/15/2021      Chronic anemia - baseline hg Hemoglobin & Hematocrit  Recent Labs    08/15/21 1515 08/15/21 1611  HGB 11.4* 11.9*    While in ER: Noted to be orthostatic and in A.fib Cr slightly up 1.3- 1.8   ED Triage Vitals  Enc Vitals Group     BP 08/15/21 1511 100/68     Pulse Rate 08/15/21 1520 (!) 39     Resp 08/15/21 1520 19     Temp 08/15/21 1528 97.8 F (36.6 C)     Temp Source 08/15/21 1528 Temporal     SpO2 08/15/21 1520 97 %     Weight 08/15/21 1528 150 lb (68 kg)     Height 08/15/21 1528 5' 4.5" (1.638 m)     Head Circumference --      Peak Flow --      Pain Score 08/15/21 1527 0     Pain Loc --      Pain Edu? --      Excl. in [place]? --   TMAX(24)@     _________________________________________ Significant initial  Findings: Abnormal Labs Reviewed  CBC WITH DIFFERENTIAL/PLATELET -  Abnormal; Notable for the following components:      Result Value   Hemoglobin 11.4 (*)    All other components within normal limits  COMPREHENSIVE METABOLIC PANEL - Abnormal; Notable for the following components:   Glucose, Bld 107 (*)    BUN 30 (*)    Creatinine, Ser 1.70 (*)    Total Protein 6.3 (*)    GFR, Estimated 29 (*)    All other components within normal limits  PROTIME-INR - Abnormal; Notable for the following components:   Prothrombin Time 16.1 (*)    INR 1.3 (*)    All other components within normal limits  I-STAT CHEM 8, ED - Abnormal; Notable for the following components:   BUN 31 (*)    Creatinine, Ser 1.80 (*)    Glucose, Bld 103 (*)    Hemoglobin 11.9 (*)    HCT 35.0 (*)    All other components within normal limits   ____________________________________________ Ordered CT HEAD . Acute minimally depressed bilateral nasal bone fractures with overlying soft tissue swelling.  CXR -  NON acute    Right hand No acute osseous abnormality. _________________________ Troponin   ordered ECG: Ordered Personally reviewed by me showing: HR : 89 Rhythm: A.fib.  no evidence of ischemic changes QTC 465 __________________  The recent clinical data is shown below. Vitals:   08/15/21 1700 08/15/21 1715 08/15/21 1730 08/15/21 1745  BP: 118/83 125/80 97/75 110/69  Pulse: 97 65 94 82  Resp: 15 15 (!) 21 20  Temp:      TempSrc:      SpO2: 97% 97% 94% 96%  Weight:      Height:        WBC     Component Value Date/Time   WBC 6.4 08/15/2021 1515   LYMPHSABS 2.0 08/15/2021 1515   MONOABS 0.5 08/15/2021 1515   EOSABS 0.2 08/15/2021 1515   BASOSABS 0.1 08/15/2021 1515     Lactic Acid, Venous    Component Value Date/Time   LATICACIDVEN 1.6 08/15/2021 1515   UA ordered    No results found for this or any previous visit.  _______________________________________________ Hospitalist was called for admission for syncope  The following Work up has been ordered so far:  Orders Placed This Encounter  Procedures   Resp Panel by RT-PCR (Flu A&B, Covid) Nasopharyngeal Swab   CT Head Wo Contrast   CT Cervical Spine Wo Contrast   DG Chest Port 1 View   DG Pelvis Portable   DG Hand Complete Right   CT Maxillofacial Wo Contrast   CBC with Differential   Comprehensive metabolic panel   Urinalysis, Routine w reflex microscopic   Ammonia   Lactic acid, plasma   Ethanol   Protime-INR   Diet NPO time specified   Cardiac monitoring   Interrogate Pacemaker if present   Measure blood pressure   Initiate Carrier Fluid Protocol   Orthostatic vital signs   Dermabond   Remove c-collar   Consult to hospitalist   Pulse oximetry, continuous   CBG monitoring, ED   I-Stat Chem 8, ED   EKG 12-Lead   Insert peripheral IV    Following Medications were ordered in ER: Medications  sodium chloride 0.9 % bolus 500 mL (500 mLs Intravenous New Bag/Given 08/15/21 1657)  lidocaine (PF) (XYLOCAINE) 1 % injection 5 mL (5 mLs Intradermal Given by Other 08/15/21 1840)         Consult Orders  (From admission, onward)  Start     Ordered   08/15/21 1741  Consult to hospitalist  Once       Provider:  (Not yet assigned)  Question Answer Comment  Place call to: [facility]   Reason for Consult Admit      08/15/21 1740              OTHER Significant initial  Findings:  labs showing:    Recent Labs  Lab 08/15/21 1515 08/15/21 1611  NA 138 140  K 4.2 4.2  CO2 27  --   GLUCOSE 107* 103*  BUN 30* 31*  CREATININE 1.70* 1.80*  CALCIUM 9.4  --     Cr  Up from baseline see below Lab Results  Component Value Date   CREATININE 1.80 (H) 08/15/2021   CREATININE 1.70 (H) 08/15/2021    Recent Labs  Lab 08/15/21 1515  AST 23  ALT 16  ALKPHOS 76  BILITOT 0.9  PROT 6.3*  ALBUMIN 3.5   Lab Results  Component Value Date   CALCIUM 9.4 08/15/2021        Plt: Lab Results  Component Value Date   PLT 292 08/15/2021     ABG    Component Value Date/Time   TCO2 27 08/15/2021 1611     Recent Labs  Lab 08/15/21 1515 08/15/21 1611  WBC 6.4  --   NEUTROABS 3.5  --   HGB 11.4* 11.9*  HCT 36.1 35.0*  MCV 92.1  --   PLT 292  --     HG/HCT  stable,  from baseline see below    Component Value Date/Time   HGB 11.9 (L) 08/15/2021 1611   HCT 35.0 (L) 08/15/2021 1611   MCV 92.1 08/15/2021 1515    No results for input(s): LIPASE, AMYLASE in the last 168 hours. Recent Labs  Lab 08/15/21 1531  AMMONIA 19     Cardiac Panel (last 3 results) No results for input(s): CKTOTAL, CKMB, TROPONINI, RELINDX in the last 72 hours.   BNP (last 3 results) No results for input(s): BNP in the last 8760 hours.      Cultures: No results found for: SDES, SPECREQUEST, CULT, REPTSTATUS   Radiological Exams on Admission: CT Head Wo Contrast  Result Date: 08/15/2021 CLINICAL DATA:  Fall.  Facial trauma EXAM: CT HEAD WITHOUT CONTRAST CT MAXILLOFACIAL WITHOUT CONTRAST CT CERVICAL SPINE WITHOUT CONTRAST TECHNIQUE: Multidetector CT  imaging of the head, cervical spine, and maxillofacial structures were performed using the standard protocol without intravenous contrast. Multiplanar CT image reconstructions of the cervical spine and maxillofacial structures were also generated. COMPARISON:  03/23/2020 FINDINGS: CT HEAD FINDINGS Brain: No evidence of acute infarction, hemorrhage, hydrocephalus, extra-axial collection or mass lesion/mass effect. Scattered low-density changes within the periventricular and subcortical white matter compatible with chronic microvascular ischemic change. Mild diffuse cerebral volume loss. Vascular: Atherosclerotic calcifications involving the large vessels of the skull base. No unexpected hyperdense vessel. Skull: Normal. Negative for fracture or focal lesion. Other: Negative for scalp hematoma. CT MAXILLOFACIAL FINDINGS Osseous: Acute minimally depressed bilateral nasal bone fractures. No additional acute maxillofacial bone fracture. Bony orbital walls are intact. Mandible intact. Temporomandibular joints are aligned without dislocation. The degenerative changes of the bilateral temporomandibular joints. Orbits: Negative. No traumatic or inflammatory finding. Sinuses: Clear. Soft tissues: Soft tissue swelling over the nasal bridge. No facial hematoma. CT CERVICAL SPINE FINDINGS Alignment: Facet joints are aligned without dislocation or traumatic listhesis. Dens and lateral masses are aligned. Skull base and vertebrae: Postsurgical changes  from prior C4-C6 ACDF with solid arthrodesis. No evidence of acute fracture. No destructive bone lesion. Soft tissues and spinal canal: No prevertebral fluid or swelling. No visible canal hematoma. Disc levels: Adjacent segment disease at C3-4 and C6-7. Multilevel bilateral facet arthropathy. Prominent degenerative changes at the C1-2 articulation on the left. Upper chest: Biapical pleuroparenchymal scarring. Other: Bilateral carotid atherosclerosis. IMPRESSION: 1. No acute  intracranial findings. 2. Acute minimally depressed bilateral nasal bone fractures with overlying soft tissue swelling. 3. No evidence for acute traumatic injury to the cervical spine. 4. Postsurgical changes from prior C4-C6 ACDF with solid arthrodesis. Electronically Signed   By: Davina Poke D.O.   On: 08/15/2021 16:06   CT Cervical Spine Wo Contrast  Result Date: 08/15/2021 CLINICAL DATA:  Fall.  Facial trauma EXAM: CT HEAD WITHOUT CONTRAST CT MAXILLOFACIAL WITHOUT CONTRAST CT CERVICAL SPINE WITHOUT CONTRAST TECHNIQUE: Multidetector CT imaging of the head, cervical spine, and maxillofacial structures were performed using the standard protocol without intravenous contrast. Multiplanar CT image reconstructions of the cervical spine and maxillofacial structures were also generated. COMPARISON:  03/23/2020 FINDINGS: CT HEAD FINDINGS Brain: No evidence of acute infarction, hemorrhage, hydrocephalus, extra-axial collection or mass lesion/mass effect. Scattered low-density changes within the periventricular and subcortical white matter compatible with chronic microvascular ischemic change. Mild diffuse cerebral volume loss. Vascular: Atherosclerotic calcifications involving the large vessels of the skull base. No unexpected hyperdense vessel. Skull: Normal. Negative for fracture or focal lesion. Other: Negative for scalp hematoma. CT MAXILLOFACIAL FINDINGS Osseous: Acute minimally depressed bilateral nasal bone fractures. No additional acute maxillofacial bone fracture. Bony orbital walls are intact. Mandible intact. Temporomandibular joints are aligned without dislocation. The degenerative changes of the bilateral temporomandibular joints. Orbits: Negative. No traumatic or inflammatory finding. Sinuses: Clear. Soft tissues: Soft tissue swelling over the nasal bridge. No facial hematoma. CT CERVICAL SPINE FINDINGS Alignment: Facet joints are aligned without dislocation or traumatic listhesis. Dens and lateral  masses are aligned. Skull base and vertebrae: Postsurgical changes from prior C4-C6 ACDF with solid arthrodesis. No evidence of acute fracture. No destructive bone lesion. Soft tissues and spinal canal: No prevertebral fluid or swelling. No visible canal hematoma. Disc levels: Adjacent segment disease at C3-4 and C6-7. Multilevel bilateral facet arthropathy. Prominent degenerative changes at the C1-2 articulation on the left. Upper chest: Biapical pleuroparenchymal scarring. Other: Bilateral carotid atherosclerosis. IMPRESSION: 1. No acute intracranial findings. 2. Acute minimally depressed bilateral nasal bone fractures with overlying soft tissue swelling. 3. No evidence for acute traumatic injury to the cervical spine. 4. Postsurgical changes from prior C4-C6 ACDF with solid arthrodesis. Electronically Signed   By: Davina Poke D.O.   On: 08/15/2021 16:06   DG Pelvis Portable  Result Date: 08/15/2021 CLINICAL DATA:  Status post fall. EXAM: PORTABLE PELVIS 1-2 VIEWS COMPARISON:  None. FINDINGS: There is no evidence of pelvic fracture or diastasis. No pelvic bone lesions are seen. IMPRESSION: Negative. Electronically Signed   By: Iven Finn M.D.   On: 08/15/2021 17:52   DG Chest Port 1 View  Result Date: 08/15/2021 CLINICAL DATA:  Status post fall EXAM: PORTABLE CHEST 1 VIEW.  Patient is rotated. COMPARISON:  None. FINDINGS: The heart and mediastinal contours are within grossly normal limits. No focal consolidation. No pulmonary edema. No pleural effusion. No pneumothorax. No acute osseous abnormality. IMPRESSION: No active disease. Electronically Signed   By: Iven Finn M.D.   On: 08/15/2021 17:53   DG Hand Complete Right  Result Date: 08/15/2021 CLINICAL DATA:  Fall with right  hand pain EXAM: RIGHT HAND - COMPLETE 3+ VIEW COMPARISON:  None. FINDINGS: No acute displaced fracture or malalignment. Advanced arthritis at the D IP joints with mild arthritis at the PIP and first and second  MCP joints. Moderate arthritis at the first [facility] joint and STT interval. IMPRESSION: No acute osseous abnormality. Electronically Signed   By: Donavan Foil M.D.   On: 08/15/2021 16:53   CT Maxillofacial Wo Contrast  Result Date: 08/15/2021 CLINICAL DATA:  Fall.  Facial trauma EXAM: CT HEAD WITHOUT CONTRAST CT MAXILLOFACIAL WITHOUT CONTRAST CT CERVICAL SPINE WITHOUT CONTRAST TECHNIQUE: Multidetector CT imaging of the head, cervical spine, and maxillofacial structures were performed using the standard protocol without intravenous contrast. Multiplanar CT image reconstructions of the cervical spine and maxillofacial structures were also generated. COMPARISON:  03/23/2020 FINDINGS: CT HEAD FINDINGS Brain: No evidence of acute infarction, hemorrhage, hydrocephalus, extra-axial collection or mass lesion/mass effect. Scattered low-density changes within the periventricular and subcortical white matter compatible with chronic microvascular ischemic change. Mild diffuse cerebral volume loss. Vascular: Atherosclerotic calcifications involving the large vessels of the skull base. No unexpected hyperdense vessel. Skull: Normal. Negative for fracture or focal lesion. Other: Negative for scalp hematoma. CT MAXILLOFACIAL FINDINGS Osseous: Acute minimally depressed bilateral nasal bone fractures. No additional acute maxillofacial bone fracture. Bony orbital walls are intact. Mandible intact. Temporomandibular joints are aligned without dislocation. The degenerative changes of the bilateral temporomandibular joints. Orbits: Negative. No traumatic or inflammatory finding. Sinuses: Clear. Soft tissues: Soft tissue swelling over the nasal bridge. No facial hematoma. CT CERVICAL SPINE FINDINGS Alignment: Facet joints are aligned without dislocation or traumatic listhesis. Dens and lateral masses are aligned. Skull base and vertebrae: Postsurgical changes from prior C4-C6 ACDF with solid arthrodesis. No evidence of acute fracture.  No destructive bone lesion. Soft tissues and spinal canal: No prevertebral fluid or swelling. No visible canal hematoma. Disc levels: Adjacent segment disease at C3-4 and C6-7. Multilevel bilateral facet arthropathy. Prominent degenerative changes at the C1-2 articulation on the left. Upper chest: Biapical pleuroparenchymal scarring. Other: Bilateral carotid atherosclerosis. IMPRESSION: 1. No acute intracranial findings. 2. Acute minimally depressed bilateral nasal bone fractures with overlying soft tissue swelling. 3. No evidence for acute traumatic injury to the cervical spine. 4. Postsurgical changes from prior C4-C6 ACDF with solid arthrodesis. Electronically Signed   By: Davina Poke D.O.   On: 08/15/2021 16:06   _______________________________________________________________________________________________________ Latest   Blood pressure 110/69, pulse 82, temperature 97.8 F (36.6 C), temperature source Temporal, resp. rate 20, height 5' 4.5" (1.638 m), weight 68 kg, SpO2 96 %.   Review of Systems:    Pertinent positives include:    fatigue, Constitutional:  No weight loss, night sweats, Fevers, chills, weight loss  HEENT:  No headaches, Difficulty swallowing,Tooth/dental problems,Sore throat,  No sneezing, itching, ear ache, nasal congestion, post nasal drip,  Cardio-vascular:  No chest pain, Orthopnea, PND, anasarca, dizziness, palpitations.no Bilateral lower extremity swelling  GI:  No heartburn, indigestion, abdominal pain, nausea, vomiting, diarrhea, change in bowel habits, loss of appetite, melena, blood in stool, hematemesis Resp:  no shortness of breath at rest. No dyspnea on exertion, No excess mucus, no productive cough, No non-productive cough, No coughing up of blood.No change in color of mucus.No wheezing. Skin:  no rash or lesions. No jaundice GU:  no dysuria, change in color of urine, no urgency or frequency. No straining to urinate.  No flank pain.   Musculoskeletal:  No joint pain or no joint swelling. No decreased range of motion.  No back pain.  Psych:  No change in mood or affect. No depression or anxiety. No memory loss.  Neuro: no localizing neurological complaints, no tingling, no weakness, no double vision, no gait abnormality, no slurred speech, no confusion  All systems reviewed and apart from [place] all are negative _______________________________________________________________________________________________ Past Medical History:   Past Medical History:  Diagnosis Date   Dementia ([place])    Dysrhythmia        Social History:  Ambulatory  cane,     reports that she has never smoked. She has never used smokeless tobacco. She reports that she does not drink alcohol and does not use drugs.     Family History:   Family History  Problem Relation Age of Onset   CAD Other    ______________________________________________________________________________________________ Allergies: Allergies  Allergen Reactions   Codeine Other (See Comments)   Penicillins Rash   Sulfa Antibiotics Rash     Prior to Admission medications   Not on File    ___________________________________________________________________________________________________ Physical Exam: Vitals with BMI 08/15/2021 08/15/2021 08/15/2021  Height - - -  Weight - - -  BMI - - -  Systolic 161 97 096  Diastolic 69 75 80  Pulse 82 94 65   1. General:  in No  Acute distress   Chronically ill -appearing 2. Psychological: Alert and  Oriented 3. Head/ENT:   Dry Mucous Membranes                          Head Non traumatic, neck supple                           Poor Dentition 4. SKIN: decreased Skin turgor,  Skin clean Dry and intact no rash 5. Heart: Regular rate and rhythm no  Murmur, no Rub or gallop 6. Lungs:  no wheezes or crackles   7. Abdomen: Soft,  non-tender, Non distended  bowel sounds present 8. Lower extremities: no clubbing, cyanosis,  trace edema 9. Neurologically  strength 5 out of 5 in all 4 extremities cranial nerves II through XII intact 10. MSK: Normal range of motion    Chart has been reviewed  ______________________________________________________________________________________________  Assessment/Plan 87 y.o. female with medical history significant of Breast ca, A.fib Admitted for syncope, hallucinations  Present on Admission:  Syncope and collapse -  given risk factor will admit rehydrate obtain CE, monitor on tele and obtain carotid dopplers, monitor on tele   Hallucinations - undiagnosed dementia vs Bipolar disorder, order behavioral health consult    A-fib ([place]) - given frequent falls hold off on Eliquis for tonight, cont flecainide, monitor on tele Will need discussion of risk benefits of anticoagulation with frequent falls  Hx of Breast Cancer - cont Femara   Hypotension - possibly due to dehydration   AKI (acute kidney injury) ([place]) - likely due to dehydration   Fatigue - PT,OT  Bilateral leg edema - obtain Dopplers  Possible Dementia given hx of confusion, increased forgetfulness, will arrange for Neurology follow up as an outpatient  Anemia  - obtain anemia panel  Nsal fracture will need ENT out pt follow up  Other plan as per orders.  DVT prophylaxis:  SCD   Code Status:    Code Status: Not on file FULL CODE I had personally discussed CODE STATUS with patient  Family Communication:   Family not at  Bedside  plan of care was discussed on  the phone with  Daughter,   Disposition Plan:      To home once workup is complete and patient is stable   Following barriers for discharge:                            Electrolytes corrected                               Anemia stable                                                         Will need to be able to tolerate PO                                                       Will need consultants to evaluate patient prior to discharge                   Would benefit from PT/OT eval prior to DC  Ordered                   Transition of care consulted                     Behavioral health  consulted                    Consults called: behavioral health  Admission status:  ED Disposition     ED Disposition  Admit   Condition  --   [place]: [place] [[id number]]  Level of Care: Telemetry Cardiac [[id number]]  May place patient in observation at [facility] or [place] if equivalent level of care is available:: No  Covid Evaluation: Asymptomatic Screening Protocol (No Symptoms)  Diagnosis: Syncope and collapse [780.2.ICD-9-CM]  Admitting Physician: Toy Baker [[id number]]  Attending Physician: Toy Baker [[id number]]          Obs     Level of care     tele  For 24H         Lab Results  Component Value Date   [place] NEGATIVE 08/15/2021     Precautions: admitted as Covid Negative    PPE: Used by the provider:   N95  eye Goggles,  Gloves    Clif Serio 08/15/2021, 9:00 PM    [facility]     after 2 AM please page floor coverage PA If 7AM-7PM, please contact the day team taking care of the patient using [email or web address]   Patient was evaluated in the context of the global COVID-19 pandemic, which necessitated consideration that the patient might be at risk for infection with the SARS-CoV-2 virus that causes COVID-19. Institutional protocols and algorithms that pertain to the evaluation of patients at risk for COVID-19 are in a state of rapid change based on information released by regulatory bodies including the CDC and federal and state organizations. These policies and algorithms were followed during the patient's care.

## 2021-08-15 NOTE — ED Notes (Signed)
Patient transported to CT 

## 2021-08-15 NOTE — ED Notes (Signed)
Pt provided with warm blanket and made aware of the need of an urine sample. [place] in place.

## 2021-08-15 NOTE — ED Provider Notes (Addendum)
[place] EMERGENCY DEPARTMENT Provider Note   CSN: [id number] Arrival date & time: 08/15/21  1510     History No chief complaint on file.   Bridget Lane is a 87 y.o. female with a past medical history of atrial fibrillation, H. pylori, previous GI bleed who presents via EMS as a level 2 trauma for fall on blood thinners.  Patient has a history of atrial fibrillation and is anticoagulated on Eliquis.  [facility] [place] [facility] [place] [facility] [place].  [facility] at [facility] was able to review footage showing the patient was standing and then fell forward without bracing for the fall.  No concern for syncope.  Patient noted to have bleeding from the nasal bridge and chin.  EMS reports that the patient was initially oriented to person and place however during the ride said some confused things and then became only oriented to person.  Patient states that [facility] had [facility] and she is not sure what happened.  She states that she does not wish for [place] to call her family because her husband apparently has heart condition she does not want him to worry him.  She denies any abdominal pain, chest pain, shortness of breath.  She denies extremity pain.   HPI     No past medical history on file.  There are no problems to display for this patient.     OB History   No obstetric history on file.     No family history on file.     Home Medications Prior to Admission medications   Not on File    Allergies    Patient has no allergy information on record.  Review of Systems   Review of Systems  Unable to perform ROS: Mental status change   Physical Exam Updated Vital Signs BP 109/69   Pulse (!) 57   Temp 97.8 F (36.6 C) (Temporal)   Resp 13   Ht 5' 4.5" (1.638 m)   Wt 68 kg   SpO2 98%   BMI 25.35 kg/m   Physical Exam Vitals and nursing note reviewed.  Constitutional:      General: She is not in acute distress.     Appearance: She is well-developed. She is not diaphoretic.  HENT:     Head: Normocephalic.     Comments: Small laceration to the nasal bridge, laceration to the chin noted. No malocclusion or dental injury.    Right Ear: Tympanic membrane and external ear normal.     Left Ear: Tympanic membrane and external ear normal.     Nose: Nose normal.     Mouth/Throat:     Mouth: Mucous membranes are moist.  Eyes:     General: No scleral icterus.    Extraocular Movements: Extraocular movements intact.     Conjunctiva/sclera: Conjunctivae normal.     Pupils: Pupils are equal, round, and reactive to light.  Neck:     Comments: Cervical collar in place Cardiovascular:     Rate and Rhythm: Normal rate and regular rhythm.     Pulses:          Radial pulses are 2+ on the right side and 2+ on the left side.       Dorsalis pedis pulses are 2+ on the right side and 2+ on the left side.     Heart sounds: Normal heart sounds. No murmur heard.   No friction  rub. No gallop.  Pulmonary:     Effort: Pulmonary effort is normal. No respiratory distress.     Breath sounds: Normal breath sounds.  Abdominal:     General: Bowel sounds are normal. There is no distension.     Palpations: Abdomen is soft. There is no mass.     Tenderness: There is no abdominal tenderness. There is no guarding.  Musculoskeletal:     Right lower leg: Edema present.     Left lower leg: Edema present.     Comments: Moves extremities without pain or weakness Bruising noted over the fourth and fifth MCP on the right hand. No abrasions or injuries to the knees.  Skin:    General: Skin is warm and dry.  Neurological:     Mental Status: She is alert and oriented to person, place, and time.     GCS: GCS eye subscore is 4. GCS verbal subscore is 4. GCS motor subscore is 6.     Cranial Nerves: Cranial nerves 2-12 are intact.     Sensory: Sensation is intact.     Motor: Motor function is intact.  Psychiatric:        Behavior:  Behavior normal.    ED Results / Procedures / Treatments   Labs (all labs ordered are listed, but only abnormal results are displayed) Labs Reviewed - No data to display  EKG EKG Interpretation  Date/Time:  Wednesday August 15 2021 15:19:18 EDT Ventricular Rate:  89 PR Interval:    QRS Duration: 124 QT Interval:  416 QTC Calculation: 465 R Axis:   -8 Text Interpretation: Atrial fibrillation Left bundle branch block No old tracing to compare Confirmed by Calvert Cantor [phone]) on 08/15/2021 3:21:34 PM  Radiology No results found.  Procedures .Marland KitchenLaceration Repair  Date/Time: 08/15/2021 8:42 PM Performed by: Margarita Mail, PA-C Authorized by: Margarita Mail, PA-C   Consent:    Consent obtained:  Verbal   Consent given by:  Patient   Risks discussed:  Infection, need for additional repair, pain, poor cosmetic result and poor wound healing   Alternatives discussed:  No treatment and delayed treatment Universal protocol:    Procedure explained and questions answered to patient or proxy's satisfaction: yes     Relevant documents present and verified: yes     Test results available: yes     Imaging studies available: yes     Required blood products, implants, devices, and special equipment available: yes     Site/side marked: yes     Immediately prior to procedure, a time out was called: yes     Patient identity confirmed:  Verbally with patient Anesthesia:    Anesthesia method:  Local infiltration   Local anesthetic:  Lidocaine 1% w/o epi Laceration details:    Location:  Face   Face location:  Chin   Length (cm):  4   Depth (mm):  10 Pre-procedure details:    Preparation:  Patient was prepped and draped in usual sterile fashion Exploration:    Wound exploration: wound explored through full range of motion and entire depth of wound visualized   Treatment:    Area cleansed with:  Povidone-iodine   Amount of cleaning:  Standard   Irrigation solution:  Sterile  water   Irrigation method:  Syringe Skin repair:    Repair method:  Sutures   Suture size:  5-0   Wound skin closure material used: vicryl rapide.   Suture technique:  Simple interrupted   Number of sutures:  3 Approximation:    Approximation:  Close Repair type:    Repair type:  Simple Post-procedure details:    Dressing:  Open (no dressing)   Procedure completion:  Tolerated well, no immediate complications   Medications Ordered in ED Medications - No data to display  ED Course  I have reviewed the triage vital signs and the nursing notes.  Pertinent labs & imaging results that were available during my care of the patient were reviewed by me and considered in my medical decision making (see chart for details).  Clinical Course as of 08/15/21 2042  Wed Aug 15, 2021  1627 Creatinine(!): 1.80 About 0.50 higher than baseline- 500 NS bolus ordered and orthostatic vs  [AH]    Clinical Course User Index [AH] Margarita Mail, PA-C   MDM Rules/Calculators/A&P                         Patient with syncope - presenting as Level 2 trauma due to fall on blood thinners with facial trauma. The differential for syncope is extensive and includes, but is not limited to: arrythmia (Vtach, SVT, SSS, sinus arrest, AV block, bradycardia) aortic stenosis, AMI, HOCM, PE, atrial myxoma, pulmonary hypertension, orthostatic hypotension, (hypovolemia, drug effect, GB syndrome, micturition, cough, swall) carotid sinus sensitivity, Seizure, TIA/CVA, hypoglycemia,  Vertigo. I ordered and reviewed labs that included  CBC without significant abnormality, CMP with elevated creatinine above patient's baseline representing AKI, ethanol, lactic acid, ammonia, respiratory panel and urine within normal limits. Returned and reviewed images including plain films of the chest and pelvis and hand as well as a CT head and C-spine which showed no significant abnormality, CT maxillofacial shows a nasal bone fracture. EKG  shows rate controlled A. fib.  Patient with syncopal event likely multifactorial.  She is certainly orthostatic and has an AKI.  Patient may have be somewhat overdiuresis and states she is on a diuretic.  A. fib may also be contributing to her dizziness.  Patient will be admitted by the hospitalist.  Discussed with Dr. Roel Cluck who will bring the patient in for further evaluation. Final Clinical Impression(s) / ED Diagnoses Final diagnoses:  None    Rx / DC Orders ED Discharge Orders     None        Margarita Mail, PA-C 08/15/21 2159    Margarita Mail, PA-C 08/15/21 2213    Truddie Hidden, MD 08/15/21 2220

## 2021-08-16 ENCOUNTER — Encounter (HOSPITAL_COMMUNITY): Payer: Self-pay | Admitting: Internal Medicine

## 2021-08-16 ENCOUNTER — Observation Stay (HOSPITAL_BASED_OUTPATIENT_CLINIC_OR_DEPARTMENT_OTHER): Payer: Medicare Other

## 2021-08-16 DIAGNOSIS — R55 Syncope and collapse: Secondary | ICD-10-CM

## 2021-08-16 DIAGNOSIS — I480 Paroxysmal atrial fibrillation: Secondary | ICD-10-CM | POA: Diagnosis not present

## 2021-08-16 DIAGNOSIS — R609 Edema, unspecified: Secondary | ICD-10-CM

## 2021-08-16 LAB — PHOSPHORUS: Phosphorus: 3.9 mg/dL (ref 2.5–4.6)

## 2021-08-16 LAB — COMPREHENSIVE METABOLIC PANEL
ALT: 13 U/L (ref 0–44)
AST: 20 U/L (ref 15–41)
Albumin: 3 g/dL — ABNORMAL LOW (ref 3.5–5.0)
Alkaline Phosphatase: 63 U/L (ref 38–126)
Anion gap: 6 (ref 5–15)
BUN: 22 mg/dL (ref 8–23)
CO2: 25 mmol/L (ref 22–32)
Calcium: 8.6 mg/dL — ABNORMAL LOW (ref 8.9–10.3)
Chloride: 107 mmol/L (ref 98–111)
Creatinine, Ser: 1.4 mg/dL — ABNORMAL HIGH (ref 0.44–1.00)
GFR, Estimated: 37 mL/min — ABNORMAL LOW (ref >60)
Glucose, Bld: 96 mg/dL (ref 70–99)
Potassium: 3.8 mmol/L (ref 3.5–5.1)
Sodium: 138 mmol/L (ref 135–145)
Total Bilirubin: 0.7 mg/dL (ref 0.3–1.2)
Total Protein: 5.3 g/dL — ABNORMAL LOW (ref 6.5–8.1)

## 2021-08-16 LAB — RETICULOCYTES
Immature Retic Fract: 6.7 % (ref 2.3–15.9)
RBC.: 3.56 MIL/uL — ABNORMAL LOW (ref 3.87–5.11)
Retic Count, Absolute: 40.6 10*3/uL (ref 19.0–186.0)
Retic Ct Pct: 1.1 % (ref 0.4–3.1)

## 2021-08-16 LAB — IRON AND TIBC
Iron: 45 ug/dL (ref 28–170)
Saturation Ratios: 14 % (ref 10.4–31.8)
TIBC: 323 ug/dL (ref 250–450)
UIBC: 278 ug/dL

## 2021-08-16 LAB — CBC WITH DIFFERENTIAL/PLATELET
Abs Immature Granulocytes: 0.03 10*3/uL (ref 0.00–0.07)
Basophils Absolute: 0.1 10*3/uL (ref 0.0–0.1)
Basophils Relative: 1 %
Eosinophils Absolute: 0.2 10*3/uL (ref 0.0–0.5)
Eosinophils Relative: 2 %
HCT: 31.4 % — ABNORMAL LOW (ref 36.0–46.0)
Hemoglobin: 10.2 g/dL — ABNORMAL LOW (ref 12.0–15.0)
Immature Granulocytes: 0 %
Lymphocytes Relative: 32 %
Lymphs Abs: 2.4 10*3/uL (ref 0.7–4.0)
MCH: 28.7 pg (ref 26.0–34.0)
MCHC: 32.5 g/dL (ref 30.0–36.0)
MCV: 88.2 fL (ref 80.0–100.0)
Monocytes Absolute: 0.8 10*3/uL (ref 0.1–1.0)
Monocytes Relative: 10 %
Neutro Abs: 4.2 10*3/uL (ref 1.7–7.7)
Neutrophils Relative %: 55 %
Platelets: 266 10*3/uL (ref 150–400)
RBC: 3.56 MIL/uL — ABNORMAL LOW (ref 3.87–5.11)
RDW: 13.5 % (ref 11.5–15.5)
WBC: 7.6 10*3/uL (ref 4.0–10.5)
nRBC: 0 % (ref 0.0–0.2)

## 2021-08-16 LAB — ECHOCARDIOGRAM COMPLETE
Area-P 1/2: 3.19 cm2
Height: 64 in
S' Lateral: 3.3 cm
Weight: 2540.8 oz

## 2021-08-16 LAB — VITAMIN B12: Vitamin B-12: 278 pg/mL (ref 180–914)

## 2021-08-16 LAB — FERRITIN: Ferritin: 28 ng/mL (ref 11–307)

## 2021-08-16 LAB — TSH: TSH: 0.571 u[IU]/mL (ref 0.350–4.500)

## 2021-08-16 LAB — FOLATE: Folate: 30.4 ng/mL (ref >5.9)

## 2021-08-16 LAB — MAGNESIUM: Magnesium: 1.8 mg/dL (ref 1.7–2.4)

## 2021-08-16 MED ORDER — APIXABAN 5 MG PO TABS
5.0000 mg | ORAL_TABLET | Freq: Two times a day (BID) | ORAL | Status: DC
  Administered 2021-08-16 – 2021-08-17 (×3): 5 mg via ORAL
  Filled 2021-08-16 (×3): qty 1

## 2021-08-16 MED ORDER — SODIUM CHLORIDE 0.9 % IV SOLN: INTRAVENOUS | Status: DC

## 2021-08-16 MED ORDER — INFLUENZA VAC A&B SA ADJ QUAD 0.5 ML IM PRSY
0.5000 mL | PREFILLED_SYRINGE | INTRAMUSCULAR | Status: AC
  Administered 2021-08-16: 0.5 mL via INTRAMUSCULAR
  Filled 2021-08-16: qty 0.5

## 2021-08-16 MED ORDER — METOPROLOL TARTRATE 25 MG PO TABS: 25.0000 mg | ORAL_TABLET | Freq: Two times a day (BID) | ORAL | Status: DC

## 2021-08-16 MED ORDER — FLECAINIDE ACETATE 100 MG PO TABS
100.0000 mg | ORAL_TABLET | Freq: Two times a day (BID) | ORAL | Status: DC
  Administered 2021-08-16 – 2021-08-17 (×3): 100 mg via ORAL
  Filled 2021-08-16 (×3): qty 1

## 2021-08-16 NOTE — Evaluation (Signed)
Occupational Therapy Evaluation Patient Details Name: Bridget Lane MRN: [id number] DOB: 12/06/1932 Today's Date: 08/16/2021   History of Present Illness The pt is an 87 yo female presenting 10/26 after an unwitnessed fall at [facility]. Undergoing continued workup for dementia vs bipolar, PMH includes: HTN, afib on Eliquis, CKD IIIa.   Clinical Impression   Pt was independent prior to admission, lives alone and drives. Son in room and reports pt's behavior and cognition is at her baseline. He reports he calls her frequently and worries about her. Pt tangential throughout session, fidgeting and restless. She reports frequent falls. She is currently functioning at a modified independent level in self care and ADL transfers with no overt LOB this visit. Will follow and administer cognitive tools.     Recommendations for follow up therapy are one component of a multi-disciplinary discharge planning process, led by the attending physician.  Recommendations may be updated based on patient status, additional functional criteria and insurance authorization.   Follow Up Recommendations  Outpatient OT    Assistance Recommended at Discharge Frequent or constant Supervision/Assistance  Functional Status Assessment  Patient has had a recent decline in their functional status and demonstrates the ability to make significant improvements in function in a reasonable and predictable amount of time.  Equipment Recommendations  None recommended by OT    Recommendations for Other Services       Precautions / Restrictions Precautions Precautions: Fall Precaution Comments: reports multiple falls Restrictions Weight Bearing Restrictions: No      Mobility Bed Mobility                    Transfers Overall transfer level: Modified independent Equipment used: None                      Balance Overall balance assessment: History of Falls;Needs assistance Sitting-balance support: No  upper extremity supported;Feet supported Sitting balance-Leahy Scale: Good     Standing balance support: No upper extremity supported;During functional activity Standing balance-Leahy Scale: Good                             ADL either performed or assessed with clinical judgement   ADL Overall ADL's : Modified independent                                             Vision Baseline Vision/History: 0 No visual deficits Patient Visual Report: No change from baseline       Perception     Praxis      Pertinent Vitals/Pain Pain Assessment: No/denies pain     Hand Dominance Right   Extremity/Trunk Assessment Upper Extremity Assessment Upper Extremity Assessment: Overall WFL for tasks assessed (arthritic changes in hands)   Lower Extremity Assessment Lower Extremity Assessment: Defer to PT evaluation   Cervical / Trunk Assessment Cervical / Trunk Assessment: Normal   Communication Communication Communication: No difficulties   Cognition                                 General Comments: son at bedside and reports pt is at her baseline, pt tangential during conversation, fidgeting and restless     General Comments       Exercises  Shoulder Instructions      Home Living Family/patient expects to be discharged to:: Private residence Living Arrangements: Alone Available Help at Discharge: Family;Available PRN/intermittently (family can stay 24 hours initially) Type of Home: Apartment Home Access: Ramped entrance     Home Layout: One level     Bathroom Shower/Tub: Teacher, early years/pre: Standard     Home Equipment: Grab bars - tub/shower;Cane - single point          Prior Functioning/Environment Prior Level of Function : Independent/Modified Independent             Mobility Comments: uses cane outdoors, reports falls ADLs Comments: no assist, drives        OT Problem List: Impaired  balance (sitting and/or standing);Decreased cognition      OT Treatment/Interventions: Balance training;Patient/family education;Cognitive remediation/compensation    OT Goals(Current goals can be found in the care plan section) Acute Rehab OT Goals Patient Stated Goal: return home OT Goal Formulation: With patient Time For Goal Achievement: 08/30/21 Potential to Achieve Goals: Good  OT Frequency: Min 2X/week   Barriers to D/C:            Co-evaluation              AM-PAC OT "6 Clicks" Daily Activity     Outcome Measure Help from another person eating meals?: None Help from another person taking care of personal grooming?: None Help from another person toileting, which includes using toliet, bedpan, or urinal?: None Help from another person bathing (including washing, rinsing, drying)?: None Help from another person to put on and taking off regular upper body clothing?: None Help from another person to put on and taking off regular lower body clothing?: None 6 Click Score: 24   End of Session Equipment Utilized During Treatment: Gait belt  Activity Tolerance: Patient tolerated treatment well Patient left: in chair;with call bell/phone within reach;with family/visitor present  OT Visit Diagnosis: Unsteadiness on feet (R26.81);Other symptoms and signs involving cognitive function                Time: 1137-1205 OT Time Calculation (min): 28 min Charges:  OT General Charges $OT Visit: 1 Visit OT Evaluation $OT Eval Low Complexity: 1 Low OT Treatments $Self Care/Home Management : 8-22 mins  Nestor Lewandowsky, OTR/L Acute Rehabilitation Services Pager: [phone] Office: [phone]  Malka So 08/16/2021, 12:48 PM
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
## 2021-08-16 NOTE — Progress Notes (Signed)
  Echocardiogram 2D Echocardiogram has been performed.  Bridget Lane M 08/16/2021, 9:36 AM

## 2021-08-16 NOTE — TOC CAGE-AID Note (Signed)
Transition of Care [facility]) - CAGE-AID Screening   Patient Details  Name: Bridget Lane MRN: [id number] Date of Birth: 05-May-1933  Transition of Care [facility]) CM/SW Contact:    Jamie Hafford C Tarpley-Carter, [place] Phone Number: 08/16/2021, 10:58 AM   Clinical Narrative: Pt is unable to participate in Cage Aid.  Renald Haithcock Tarpley-Carter, MSW, LCSW-A Pronouns:  She/Her/Hers [facility] Clinical Social Worker Direct Number:  [phone] [email or web address]  CAGE-AID Screening: Substance Abuse Screening unable to be completed due to: : Patient unable to participate             Substance Abuse Education Offered: No

## 2021-08-16 NOTE — Progress Notes (Addendum)
Present PROGRESS NOTE    Bridget Lane  IWL:[id number] DOB: 06-10-1933 DOA: 08/15/2021 PCP: [name], MD    Brief Narrative:  This 87 years old female with PMH significant for breast CA, A. Fibrillation on eliquis, hypertension, CKD stage IIIa, chronic anemia presented in the ED S/p fall with syncopal episode in [facility].  Patient denies any prodromal symptoms before passing out.  She seems to be confused after episode.  Patient lives alone, denies smoking or drinking.  Family reports episodes of confusion and memory loss and sometimes seeing things that are not there. Patient is admitted for syncopal episode, CT head showed acute minimally depressed bilateral nasal bone fractures with overlying soft tissue swelling.  Work-up for syncope in progress.  Assessment & Plan:   Active Problems:   Syncope and collapse   Syncope, vasovagal   A-fib (HCC)   Hypotension   AKI (acute kidney injury) ([place])   Fatigue   Hallucination  Syncope: Patient presented with syncope episode in the [facility]. She denies any prodromal symptoms before passing out. It could be due to dehydration, continue gentle hydration. CT head: No acute CVA, nasal bone fractures Continue to monitor on telemetry. Obtain 2D echocardiogram and carotid duplex.  Hallucinations: Family reports visual and auditory hallucinations. Patient denies any hallucinations at present. Undiagnosed dementia versus bipolar disorder. Psychiatry consulted, awaiting recommendation.  Atrial fibrillation: Heart rate is controlled, Continue flecainide and Eliquis. Need discussion of risks vs benefits of anticoagulation with frequent falls.  Breast cancer Continue Femara  Hypotension: Possibly could be due to dehydration, Continue gentle hydration  Acute kidney injury on CKD stage IIIA: Unknown baseline serum creatinine.  Serum creatinine at admission 1.7 Continue gentle hydration, serum creatinine improving  Possible  dementia: Family reports increased forgetfulness, needs outpatient neuro follow-up.  Nasal bone fracture: Continue pain control, outpatient ENT follow-up  Chronic anemia: H&H is a stable, continue to monitor.:  Generalized weakness: PT and OT evaluation  DVT prophylaxis:  Eliquis Code Status: Full code Family Communication: No family at bedside Disposition Plan:  Status is: Observation  The patient remains OBS appropriate and will d/c before 2 midnights.   Syncope work-up.   Consultants:  None  Procedures: Echo, carotid duplex, CT head Antimicrobials:   Anti-infectives (From admission, onward)    None       Subjective: Patient was seen and examined at bedside.  Overnight events noted.   Patient reports feeling improved , She still complains of weakness.   Patient does not recall what happens before syncopal episode She denies any hallucinations at this time.  Objective: Vitals:   08/16/21 0011 08/16/21 0202 08/16/21 0402 08/16/21 0814  BP: 113/65  (!) 99/45 112/71  Pulse: (!) 109  72 70  Resp: 19  16 18   Temp: 97.8 F (36.6 C)  98.4 F (36.9 C) 98.4 F (36.9 C)  TempSrc: Oral  Oral Oral  SpO2: 100%  95% 98%  Weight:  72 kg    Height:        Intake/Output Summary (Last 24 hours) at 08/16/2021 1121 Last data filed at 08/16/2021 0329 Gross per 24 hour  Intake 990.93 ml  Output 300 ml  Net 690.93 ml   Filed Weights   08/15/21 1528 08/15/21 2124 08/16/21 0202  Weight: 68 kg 72 kg 72 kg    Examination:  General exam: Appears comfortable, not in any acute distress.  Nasal bruises noted. Respiratory system: Clear to auscultation. Respiratory effort normal. Cardiovascular system: S1-S2 heard, Irregular rhythm,  no murmur.   Gastrointestinal system: Abdomen is soft, nontender, nondistended, BS+ Central nervous system: Alert and oriented. No focal neurological deficits. Extremities: No edema, no cyanosis, no clubbing. Skin: No rashes, lesions or  ulcers Psychiatry: Judgement and insight appear normal. Mood & affect appropriate.     Data Reviewed: I have personally reviewed following labs and imaging studies  CBC: Recent Labs  Lab 08/15/21 1515 08/15/21 1611 08/16/21 0307  WBC 6.4  --  7.6  NEUTROABS 3.5  --  4.2  HGB 11.4* 11.9* 10.2*  HCT 36.1 35.0* 31.4*  MCV 92.1  --  88.2  PLT 292  --  270   Basic Metabolic Panel: Recent Labs  Lab 08/15/21 1515 08/15/21 1611 08/15/21 2154 08/16/21 0307  NA 138 140  --  138  K 4.2 4.2  --  3.8  CL 102 102  --  107  CO2 27  --   --  25  GLUCOSE 107* 103*  --  96  BUN 30* 31*  --  22  CREATININE 1.70* 1.80*  --  1.40*  CALCIUM 9.4  --   --  8.6*  MG  --   --  1.8 1.8  PHOS  --   --  3.6 3.9   GFR: Estimated Creatinine Clearance: 28.1 mL/min (A) (by C-G formula based on SCr of 1.4 mg/dL (H)). Liver Function Tests: Recent Labs  Lab 08/15/21 1515 08/16/21 0307  AST 23 20  ALT 16 13  ALKPHOS 76 63  BILITOT 0.9 0.7  PROT 6.3* 5.3*  ALBUMIN 3.5 3.0*   No results for input(s): LIPASE, AMYLASE in the last 168 hours. Recent Labs  Lab 08/15/21 1531  AMMONIA 19   Coagulation Profile: Recent Labs  Lab 08/15/21 1515  INR 1.3*   Cardiac Enzymes: Recent Labs  Lab 08/15/21 2154  CKTOTAL 86   BNP (last 3 results) No results for input(s): PROBNP in the last 8760 hours. HbA1C: No results for input(s): HGBA1C in the last 72 hours. CBG: No results for input(s): GLUCAP in the last 168 hours. Lipid Profile: No results for input(s): CHOL, HDL, LDLCALC, TRIG, CHOLHDL, LDLDIRECT in the last 72 hours. Thyroid Function Tests: Recent Labs    08/16/21 0307  TSH 0.571   Anemia Panel: Recent Labs    08/16/21 0307  VITAMINB12 278  FOLATE 30.4  FERRITIN 28  TIBC 323  IRON 45  RETICCTPCT 1.1   Sepsis Labs: Recent Labs  Lab 08/15/21 1515  LATICACIDVEN 1.6    Recent Results (from the past 240 hour(s))  Resp Panel by RT-PCR (Flu A&B, Covid) Nasopharyngeal Swab      Status: None   Collection Time: 08/15/21  3:35 PM   Specimen: Nasopharyngeal Swab; Nasopharyngeal(NP) swabs in vial transport medium  Result Value Ref Range Status   SARS Coronavirus 2 by RT PCR NEGATIVE NEGATIVE Final    Comment: (NOTE) SARS-CoV-2 target nucleic acids are NOT DETECTED.  The SARS-CoV-2 RNA is generally detectable in upper respiratory specimens during the acute phase of infection. The lowest concentration of SARS-CoV-2 viral copies this assay can detect is 138 copies/mL. A negative result does not preclude SARS-Cov-2 infection and should not be used as the sole basis for treatment or other patient management decisions. A negative result may occur with  improper specimen collection/handling, submission of specimen other than nasopharyngeal swab, presence of viral mutation(s) within the areas targeted by this assay, and inadequate number of viral copies(<138 copies/mL). A negative result must be combined with  clinical observations, patient history, and epidemiological information. The expected result is Negative.  Fact Sheet for Patients:  EntrepreneurPulse.com.au  Fact Sheet for Healthcare Providers:  IncredibleEmployment.be  This test is no t yet approved or cleared by the [place] FDA and  has been authorized for detection and/or diagnosis of SARS-CoV-2 by FDA under an Emergency Use Authorization (EUA). This EUA will remain  in effect (meaning this test can be used) for the duration of the COVID-19 declaration under Section 564(b)(1) of the Act, 21 U.S.C.section 360bbb-3(b)(1), unless the authorization is terminated  or revoked sooner.       Influenza A by PCR NEGATIVE NEGATIVE Final   Influenza B by PCR NEGATIVE NEGATIVE Final    Comment: (NOTE) The Xpert Xpress SARS-CoV-2/FLU/RSV plus assay is intended as an aid in the diagnosis of influenza from Nasopharyngeal swab specimens and should not be used as a sole  basis for treatment. Nasal washings and aspirates are unacceptable for Xpert Xpress SARS-CoV-2/FLU/RSV testing.  Fact Sheet for Patients: EntrepreneurPulse.com.au  Fact Sheet for Healthcare Providers: IncredibleEmployment.be  This test is not yet approved or cleared by the [place] FDA and has been authorized for detection and/or diagnosis of SARS-CoV-2 by FDA under an Emergency Use Authorization (EUA). This EUA will remain in effect (meaning this test can be used) for the duration of the COVID-19 declaration under Section 564(b)(1) of the Act, 21 U.S.C. section 360bbb-3(b)(1), unless the authorization is terminated or revoked.  Performed at [facility], [place], [place], [place] [address]     Radiology Studies: CT Head Wo Contrast  Result Date: 08/15/2021 CLINICAL DATA:  Fall.  Facial trauma EXAM: CT HEAD WITHOUT CONTRAST CT MAXILLOFACIAL WITHOUT CONTRAST CT CERVICAL SPINE WITHOUT CONTRAST TECHNIQUE: Multidetector CT imaging of the head, cervical spine, and maxillofacial structures were performed using the standard protocol without intravenous contrast. Multiplanar CT image reconstructions of the cervical spine and maxillofacial structures were also generated. COMPARISON:  03/23/2020 FINDINGS: CT HEAD FINDINGS Brain: No evidence of acute infarction, hemorrhage, hydrocephalus, extra-axial collection or mass lesion/mass effect. Scattered low-density changes within the periventricular and subcortical white matter compatible with chronic microvascular ischemic change. Mild diffuse cerebral volume loss. Vascular: Atherosclerotic calcifications involving the large vessels of the skull base. No unexpected hyperdense vessel. Skull: Normal. Negative for fracture or focal lesion. Other: Negative for scalp hematoma. CT MAXILLOFACIAL FINDINGS Osseous: Acute minimally depressed bilateral nasal bone fractures. No additional acute maxillofacial bone  fracture. Bony orbital walls are intact. Mandible intact. Temporomandibular joints are aligned without dislocation. The degenerative changes of the bilateral temporomandibular joints. Orbits: Negative. No traumatic or inflammatory finding. Sinuses: Clear. Soft tissues: Soft tissue swelling over the nasal bridge. No facial hematoma. CT CERVICAL SPINE FINDINGS Alignment: Facet joints are aligned without dislocation or traumatic listhesis. Dens and lateral masses are aligned. Skull base and vertebrae: Postsurgical changes from prior C4-C6 ACDF with solid arthrodesis. No evidence of acute fracture. No destructive bone lesion. Soft tissues and spinal canal: No prevertebral fluid or swelling. No visible canal hematoma. Disc levels: Adjacent segment disease at C3-4 and C6-7. Multilevel bilateral facet arthropathy. Prominent degenerative changes at the C1-2 articulation on the left. Upper chest: Biapical pleuroparenchymal scarring. Other: Bilateral carotid atherosclerosis. IMPRESSION: 1. No acute intracranial findings. 2. Acute minimally depressed bilateral nasal bone fractures with overlying soft tissue swelling. 3. No evidence for acute traumatic injury to the cervical spine. 4. Postsurgical changes from prior C4-C6 ACDF with solid arthrodesis. Electronically Signed   By: Davina Poke  D.O.   On: 08/15/2021 16:06   CT Cervical Spine Wo Contrast  Result Date: 08/15/2021 CLINICAL DATA:  Fall.  Facial trauma EXAM: CT HEAD WITHOUT CONTRAST CT MAXILLOFACIAL WITHOUT CONTRAST CT CERVICAL SPINE WITHOUT CONTRAST TECHNIQUE: Multidetector CT imaging of the head, cervical spine, and maxillofacial structures were performed using the standard protocol without intravenous contrast. Multiplanar CT image reconstructions of the cervical spine and maxillofacial structures were also generated. COMPARISON:  03/23/2020 FINDINGS: CT HEAD FINDINGS Brain: No evidence of acute infarction, hemorrhage, hydrocephalus, extra-axial collection  or mass lesion/mass effect. Scattered low-density changes within the periventricular and subcortical white matter compatible with chronic microvascular ischemic change. Mild diffuse cerebral volume loss. Vascular: Atherosclerotic calcifications involving the large vessels of the skull base. No unexpected hyperdense vessel. Skull: Normal. Negative for fracture or focal lesion. Other: Negative for scalp hematoma. CT MAXILLOFACIAL FINDINGS Osseous: Acute minimally depressed bilateral nasal bone fractures. No additional acute maxillofacial bone fracture. Bony orbital walls are intact. Mandible intact. Temporomandibular joints are aligned without dislocation. The degenerative changes of the bilateral temporomandibular joints. Orbits: Negative. No traumatic or inflammatory finding. Sinuses: Clear. Soft tissues: Soft tissue swelling over the nasal bridge. No facial hematoma. CT CERVICAL SPINE FINDINGS Alignment: Facet joints are aligned without dislocation or traumatic listhesis. Dens and lateral masses are aligned. Skull base and vertebrae: Postsurgical changes from prior C4-C6 ACDF with solid arthrodesis. No evidence of acute fracture. No destructive bone lesion. Soft tissues and spinal canal: No prevertebral fluid or swelling. No visible canal hematoma. Disc levels: Adjacent segment disease at C3-4 and C6-7. Multilevel bilateral facet arthropathy. Prominent degenerative changes at the C1-2 articulation on the left. Upper chest: Biapical pleuroparenchymal scarring. Other: Bilateral carotid atherosclerosis. IMPRESSION: 1. No acute intracranial findings. 2. Acute minimally depressed bilateral nasal bone fractures with overlying soft tissue swelling. 3. No evidence for acute traumatic injury to the cervical spine. 4. Postsurgical changes from prior C4-C6 ACDF with solid arthrodesis. Electronically Signed   By: Davina Poke D.O.   On: 08/15/2021 16:06   DG Pelvis Portable  Result Date: 08/15/2021 CLINICAL DATA:   Status post fall. EXAM: PORTABLE PELVIS 1-2 VIEWS COMPARISON:  None. FINDINGS: There is no evidence of pelvic fracture or diastasis. No pelvic bone lesions are seen. IMPRESSION: Negative. Electronically Signed   By: Iven Finn M.D.   On: 08/15/2021 17:52   DG Chest Port 1 View  Result Date: 08/15/2021 CLINICAL DATA:  Status post fall EXAM: PORTABLE CHEST 1 VIEW.  Patient is rotated. COMPARISON:  None. FINDINGS: The heart and mediastinal contours are within grossly normal limits. No focal consolidation. No pulmonary edema. No pleural effusion. No pneumothorax. No acute osseous abnormality. IMPRESSION: No active disease. Electronically Signed   By: Iven Finn M.D.   On: 08/15/2021 17:53   DG Hand Complete Right  Result Date: 08/15/2021 CLINICAL DATA:  Fall with right hand pain EXAM: RIGHT HAND - COMPLETE 3+ VIEW COMPARISON:  None. FINDINGS: No acute displaced fracture or malalignment. Advanced arthritis at the D IP joints with mild arthritis at the PIP and first and second MCP joints. Moderate arthritis at the first [facility] joint and STT interval. IMPRESSION: No acute osseous abnormality. Electronically Signed   By: Donavan Foil M.D.   On: 08/15/2021 16:53   ECHOCARDIOGRAM COMPLETE  Result Date: 08/16/2021    ECHOCARDIOGRAM REPORT   Patient Name:   Bridget Lane Date of Exam: 08/16/2021 Medical Rec #:  [id number]       Height:  64.0 in Accession #:    [id number]      Weight:       158.8 lb Date of Birth:  Feb 22, 1933       BSA:          1.774 m Patient Age:    54 years        BP:           112/71 mmHg Patient Gender: F               HR:           70 bpm. Exam Location:  Inpatient Procedure: 2D Echo, 3D Echo, Cardiac Doppler, Color Doppler and Strain Analysis Indications:    Syncope R55  History:        Patient has prior history of Echocardiogram examinations.                 Arrythmias:Atrial Fibrillation; Risk Factors:Hypertension.                 Chronic kidney disease, anemia.   Sonographer:    Darlina Sicilian RDCS Referring Phys: [place]  1. Left ventricular ejection fraction, by estimation, is 60 to 65%. Left ventricular ejection fraction by 3D volume is 56 %. The left ventricle has normal function. The left ventricle has no regional wall motion abnormalities. Left ventricular diastolic  parameters were normal. The average left ventricular global longitudinal strain is -20.9 %. The global longitudinal strain is normal.  2. Right ventricular systolic function is normal. The right ventricular size is normal. There is normal pulmonary artery systolic pressure.  3. The mitral valve is normal in structure. No evidence of mitral valve regurgitation. No evidence of mitral stenosis.  4. The aortic valve is normal in structure. Aortic valve regurgitation is trivial. No aortic stenosis is present.  5. The inferior vena cava is normal in size with greater than 50% respiratory variability, suggesting right atrial pressure of 3 mmHg. FINDINGS  Left Ventricle: Left ventricular ejection fraction, by estimation, is 60 to 65%. Left ventricular ejection fraction by 3D volume is 56 %. The left ventricle has normal function. The left ventricle has no regional wall motion abnormalities. The average left ventricular global longitudinal strain is -20.9 %. The global longitudinal strain is normal. The left ventricular internal cavity size was normal in size. There is no left ventricular hypertrophy. Left ventricular diastolic parameters were normal. Normal left ventricular filling pressure. Right Ventricle: The right ventricular size is normal. No increase in right ventricular wall thickness. Right ventricular systolic function is normal. There is normal pulmonary artery systolic pressure. The tricuspid regurgitant velocity is 2.31 m/s, and  with an assumed right atrial pressure of 3 mmHg, the estimated right ventricular systolic pressure is 33.3 mmHg. Left Atrium: Left atrial size was  normal in size. Right Atrium: Right atrial size was normal in size. Pericardium: There is no evidence of pericardial effusion. Mitral Valve: The mitral valve is normal in structure. No evidence of mitral valve regurgitation. No evidence of mitral valve stenosis. Tricuspid Valve: The tricuspid valve is normal in structure. Tricuspid valve regurgitation is trivial. No evidence of tricuspid stenosis. Aortic Valve: The aortic valve is normal in structure. Aortic valve regurgitation is trivial. No aortic stenosis is present. Pulmonic Valve: The pulmonic valve was normal in structure. Pulmonic valve regurgitation is mild. No evidence of pulmonic stenosis. Aorta: The aortic root is normal in size and structure. Venous: The inferior vena cava is normal in size with  greater than 50% respiratory variability, suggesting right atrial pressure of 3 mmHg. IAS/Shunts: No atrial level shunt detected by color flow Doppler.  LEFT VENTRICLE PLAX 2D LVIDd:         4.40 cm         Diastology LVIDs:         3.30 cm         LV e' medial:    9.08 cm/s LV PW:         1.00 cm         LV E/e' medial:  8.4 LV IVS:        0.90 cm         LV e' lateral:   11.00 cm/s LVOT diam:     2.10 cm         LV E/e' lateral: 6.9 LV SV:         46 LV SV Index:   26              2D LVOT Area:     3.46 cm        Longitudinal                                Strain                                2D Strain GLS  -19.0 %                                (A2C):                                2D Strain GLS  -23.1 %                                (A3C):                                2D Strain GLS  -20.5 %                                (A4C):                                2D Strain GLS  -20.9 %                                Avg:                                 3D Volume EF                                LV 3D EF:    Left  ventricul                                             ar                                              ejection                                             fraction                                             by 3D                                             volume is                                             56 %.                                 3D Volume EF:                                3D EF:        56 %                                LV EDV:       118 ml                                LV ESV:       52 ml                                LV SV:        66 ml RIGHT VENTRICLE RV S prime:     11.00 cm/s TAPSE (M-mode): 1.8 cm LEFT ATRIUM             Index        RIGHT ATRIUM           Index LA diam:        3.50 cm 1.97 cm/m   RA Area:     13.60 cm LA Vol (A2C):   28.6 ml 16.12 ml/m  RA Volume:   29.70 ml  16.74 ml/m LA Vol (A4C):   34.2 ml 19.28 ml/m LA Biplane Vol: 32.0 ml 18.04 ml/m  AORTIC VALVE LVOT Vmax:   71.70 cm/s LVOT Vmean:  48.400 cm/s LVOT VTI:  0.132 m  AORTA Ao Root diam: 3.30 cm Ao Asc diam:  3.10 cm MITRAL VALVE               TRICUSPID VALVE MV Area (PHT): 3.19 cm    TR Peak grad:   21.3 mmHg MV Decel Time: 238 msec    TR Vmax:        231.00 cm/s MV E velocity: 76.10 cm/s MV A velocity: 65.40 cm/s  SHUNTS MV E/A ratio:  1.16        Systemic VTI:  0.13 m                            Systemic Diam: 2.10 cm Fransico Him MD Electronically signed by Fransico Him MD Signature Date/Time: 08/16/2021/9:55:58 AM    Final    VAS US CAROTID  Result Date: 08/16/2021 Carotid Arterial Duplex Study Patient Name:  Bridget Lane  Date of Exam:   08/16/2021 Medical Rec #: [id number]        Accession #:    [id number] Date of Birth: [birth year]        Patient Gender: F Patient Age:   55 years Exam Location:  [facility] Procedure:      VAS US CAROTID Referring Phys: Nyoka Lint DOUTOVA --------------------------------------------------------------------------------  Indications:       Syncope. Comparison Study:  no prior Performing Technologist: Archie Patten RVS  Examination Guidelines: A complete  evaluation includes B-mode imaging, spectral Doppler, color Doppler, and power Doppler as needed of all accessible portions of each vessel. Bilateral testing is considered an integral part of a complete examination. Limited examinations for reoccurring indications may be performed as noted.  Right Carotid Findings: +----------+--------+--------+--------+------------------+--------+           PSV cm/sEDV cm/sStenosisPlaque DescriptionComments +----------+--------+--------+--------+------------------+--------+ CCA Prox  82      13              heterogenous               +----------+--------+--------+--------+------------------+--------+ CCA Distal68      17              heterogenous               +----------+--------+--------+--------+------------------+--------+ ICA Prox  64      24      1-39%   heterogenous               +----------+--------+--------+--------+------------------+--------+ ICA Distal82      25                                         +----------+--------+--------+--------+------------------+--------+ ECA       52      7                                          +----------+--------+--------+--------+------------------+--------+ +----------+--------+-------+--------+-------------------+           PSV cm/sEDV cmsDescribeArm Pressure (mmHG) +----------+--------+-------+--------+-------------------+ [id number]                                         +----------+--------+-------+--------+-------------------+ +---------+--------+--+--------+--+---------+ VertebralPSV cm/s66EDV cm/s17Antegrade +---------+--------+--+--------+--+---------+  Left Carotid Findings: +----------+--------+--------+--------+------------------+--------+  PSV cm/sEDV cm/sStenosisPlaque DescriptionComments +----------+--------+--------+--------+------------------+--------+ CCA Prox  75      19              heterogenous                +----------+--------+--------+--------+------------------+--------+ CCA Distal58      14              heterogenous               +----------+--------+--------+--------+------------------+--------+ ICA Prox  85      29      1-39%   heterogenous               +----------+--------+--------+--------+------------------+--------+ ICA Distal78      26                                         +----------+--------+--------+--------+------------------+--------+ ECA       63      13                                         +----------+--------+--------+--------+------------------+--------+ +----------+--------+--------+--------+-------------------+           PSV cm/sEDV cm/sDescribeArm Pressure (mmHG) +----------+--------+--------+--------+-------------------+ [id number]                                          +----------+--------+--------+--------+-------------------+ +---------+--------+--+--------+--+---------+ VertebralPSV cm/s59EDV cm/s21Antegrade +---------+--------+--+--------+--+---------+   Summary: Right Carotid: Velocities in the right ICA are consistent with a 1-39% stenosis. Left Carotid: Velocities in the left ICA are consistent with a 1-39% stenosis. Vertebrals: Bilateral vertebral arteries demonstrate antegrade flow. *See table(s) above for measurements and observations.     Preliminary    VAS [place] LOWER EXTREMITY VENOUS (DVT)  Result Date: 08/16/2021  Lower Venous DVT Study Patient Name:  Bridget Lane  Date of Exam:   08/16/2021 Medical Rec #: [id number]        Accession #:    [id number] Date of Birth: 1932/12/14        Patient Gender: F Patient Age:   42 years Exam Location:  [facility] Procedure:      VAS [place] LOWER EXTREMITY VENOUS (DVT) Referring Phys: Nyoka Lint DOUTOVA --------------------------------------------------------------------------------  Indications: Edema.  Comparison Study: no prior Performing Technologist: Archie Patten RVS   Examination Guidelines: A complete evaluation includes B-mode imaging, spectral Doppler, color Doppler, and power Doppler as needed of all accessible portions of each vessel. Bilateral testing is considered an integral part of a complete examination. Limited examinations for reoccurring indications may be performed as noted. The reflux portion of the exam is performed with the patient in reverse Trendelenburg.  +---------+---------------+---------+-----------+----------+--------------+ RIGHT    CompressibilityPhasicitySpontaneityPropertiesThrombus Aging +---------+---------------+---------+-----------+----------+--------------+ CFV      Full           Yes      Yes                                 +---------+---------------+---------+-----------+----------+--------------+ SFJ      Full                                                        +---------+---------------+---------+-----------+----------+--------------+  FV Prox  Full                                                        +---------+---------------+---------+-----------+----------+--------------+ FV Mid   Full                                                        +---------+---------------+---------+-----------+----------+--------------+ FV DistalFull                                                        +---------+---------------+---------+-----------+----------+--------------+ PFV      Full                                                        +---------+---------------+---------+-----------+----------+--------------+ POP      Full           Yes      Yes                                 +---------+---------------+---------+-----------+----------+--------------+ PTV      Full                                                        +---------+---------------+---------+-----------+----------+--------------+ PERO     Full                                                         +---------+---------------+---------+-----------+----------+--------------+   +---------+---------------+---------+-----------+----------+--------------+ LEFT     CompressibilityPhasicitySpontaneityPropertiesThrombus Aging +---------+---------------+---------+-----------+----------+--------------+ CFV      Full           Yes      Yes                                 +---------+---------------+---------+-----------+----------+--------------+ SFJ      Full                                                        +---------+---------------+---------+-----------+----------+--------------+ FV Prox  Full                                                        +---------+---------------+---------+-----------+----------+--------------+  FV Mid   Full                                                        +---------+---------------+---------+-----------+----------+--------------+ FV DistalFull                                                        +---------+---------------+---------+-----------+----------+--------------+ PFV      Full                                                        +---------+---------------+---------+-----------+----------+--------------+ POP      Full           Yes      Yes                                 +---------+---------------+---------+-----------+----------+--------------+ PTV      Full                                                        +---------+---------------+---------+-----------+----------+--------------+ PERO     Full                                                        +---------+---------------+---------+-----------+----------+--------------+     Summary: BILATERAL: - No evidence of deep vein thrombosis seen in the lower extremities, bilaterally. -No evidence of popliteal cyst, bilaterally.   *See table(s) above for measurements and observations.    Preliminary    CT Maxillofacial Wo Contrast  Result Date:  08/15/2021 CLINICAL DATA:  Fall.  Facial trauma EXAM: CT HEAD WITHOUT CONTRAST CT MAXILLOFACIAL WITHOUT CONTRAST CT CERVICAL SPINE WITHOUT CONTRAST TECHNIQUE: Multidetector CT imaging of the head, cervical spine, and maxillofacial structures were performed using the standard protocol without intravenous contrast. Multiplanar CT image reconstructions of the cervical spine and maxillofacial structures were also generated. COMPARISON:  03/23/2020 FINDINGS: CT HEAD FINDINGS Brain: No evidence of acute infarction, hemorrhage, hydrocephalus, extra-axial collection or mass lesion/mass effect. Scattered low-density changes within the periventricular and subcortical white matter compatible with chronic microvascular ischemic change. Mild diffuse cerebral volume loss. Vascular: Atherosclerotic calcifications involving the large vessels of the skull base. No unexpected hyperdense vessel. Skull: Normal. Negative for fracture or focal lesion. Other: Negative for scalp hematoma. CT MAXILLOFACIAL FINDINGS Osseous: Acute minimally depressed bilateral nasal bone fractures. No additional acute maxillofacial bone fracture. Bony orbital walls are intact. Mandible intact. Temporomandibular joints are aligned without dislocation. The degenerative changes of the bilateral temporomandibular joints. Orbits: Negative. No traumatic or inflammatory finding. Sinuses: Clear. Soft tissues: Soft tissue swelling over the nasal bridge. No facial hematoma. CT  CERVICAL SPINE FINDINGS Alignment: Facet joints are aligned without dislocation or traumatic listhesis. Dens and lateral masses are aligned. Skull base and vertebrae: Postsurgical changes from prior C4-C6 ACDF with solid arthrodesis. No evidence of acute fracture. No destructive bone lesion. Soft tissues and spinal canal: No prevertebral fluid or swelling. No visible canal hematoma. Disc levels: Adjacent segment disease at C3-4 and C6-7. Multilevel bilateral facet arthropathy. Prominent  degenerative changes at the C1-2 articulation on the left. Upper chest: Biapical pleuroparenchymal scarring. Other: Bilateral carotid atherosclerosis. IMPRESSION: 1. No acute intracranial findings. 2. Acute minimally depressed bilateral nasal bone fractures with overlying soft tissue swelling. 3. No evidence for acute traumatic injury to the cervical spine. 4. Postsurgical changes from prior C4-C6 ACDF with solid arthrodesis. Electronically Signed   By: Davina Poke D.O.   On: 08/15/2021 16:06    Scheduled Meds:  influenza vaccine adjuvanted  0.5 mL Intramuscular Tomorrow-1000   letrozole  2.5 mg Oral Daily   pantoprazole  40 mg Oral Daily   Continuous Infusions:  sodium chloride       LOS: 0 days    Time spent: 35 mins    Rosaisela Jamroz, MD [facility]   If 7PM-7AM, please contact night-coverage

## 2021-08-16 NOTE — Evaluation (Signed)
Physical Therapy Evaluation Patient Details Name: Bridget Lane MRN: [id number] DOB: Feb 07, 1933 Today's Date: 08/16/2021  History of Present Illness  The pt is an 87 yo female presenting 10/26 after an unwitnessed fall at [facility]. Undergoing continued workup for dementia vs bipolar, PMH includes: HTN, afib on Eliquis, CKD IIIa.   Clinical Impression  Pt in bed upon arrival of PT, agreeable to evaluation at this time. Prior to admission the pt was living alone, independent with all mobility and ADLs, reports she was driving and active in community, hopeful to restart physical activity program soon. The pt now presents with limitations in functional mobility and dynamic stability due to above dx, and will continue to benefit from skilled PT to address these deficits. The pt was able to demo good independence with all bed mobility and sit-stand transfers with use of UE support. The pt requires either UE support or increased time and use of momentum to complete without use of her arms. She was able to complete hallway ambulation and stairs without use of AD or assist, but demos mild instability especially with narrow BOS or in environments with reduced vision. Recommend continued skilled PT acutely to address high-level balance deficits as well as OPPT following d/c to continue to address balance dysfunction and reduce risk of falls.   I discussed returning home with family (husband or local daughter who does not work) to provide supervision initially and pt states they would be able to stay with her following d/c.   Dynamic Gait Index (DGI): 19/24 (<19 indicates increased risk for falls)  5X Sit-to-Stand: 19 sec (> 14.8 sec indicates increased risk of falls for individuals aged 95-89, > 15 sec indicates increased risk of recurrent falls)   Recommendations for follow up therapy are one component of a multi-disciplinary discharge planning process, led by the attending physician.  Recommendations may be  updated based on patient status, additional functional criteria and insurance authorization.  Follow Up Recommendations Outpatient PT (for balance)    Assistance Recommended at Discharge Intermittent Supervision/Assistance  Functional Status Assessment Patient has had a recent decline in their functional status and demonstrates the ability to make significant improvements in function in a reasonable and predictable amount of time.  Equipment Recommendations  Other (comment) (shower chair)       Precautions / Restrictions Precautions Precautions: Fall Precaution Comments: pt admitted for fall, 4 in last 6 months Restrictions Weight Bearing Restrictions: No      Mobility  Bed Mobility Overal bed mobility: Independent                  Transfers Overall transfer level: Modified independent Equipment used: None               General transfer comment: increased time or momentum without use of UE    Ambulation/Gait Ambulation/Gait assistance: Supervision Gait Distance (Feet): 400 Feet Assistive device: None Gait Pattern/deviations: Step-through pattern;Decreased stride length Gait velocity: 0.57 m/s Gait velocity interpretation: <1.8 ft/sec, indicate of risk for recurrent falls General Gait Details: mild instability but no overt LOB, even with balance challenges  Stairs Stairs: Yes Stairs assistance: Min guard Stair Management: One rail Left;Alternating pattern;Forwards Number of Stairs: 4 General stair comments: alternating pattern with slow but steady steps. no LOB      Balance Overall balance assessment: History of Falls;Needs assistance Sitting-balance support: No upper extremity supported;Feet supported Sitting balance-Leahy Scale: Good     Standing balance support: No upper extremity supported;During functional activity Standing balance-Leahy Scale:  Good   Single Leg Stance - Right Leg: 2 Single Leg Stance - Left Leg: 3 Tandem Stance - Right  Leg: 8 (semi-tandem with 2" separation) Tandem Stance - Left Leg: 15 (semi-tandem with 2" separation) [place] - Eyes Opened: 30 [place] - Eyes Closed: 30 High level balance activites: Direction changes;Backward walking;Turns;Sudden stops;Head turns High Level Balance Comments: pt stable, no overt LOB or need for assist Standardized Balance Assessment Standardized Balance Assessment : Dynamic Gait Index   Dynamic Gait Index Level Surface: Normal Change in Gait Speed: Mild Impairment Gait with Horizontal Head Turns: Normal Gait with Vertical Head Turns: Normal Gait and Pivot Turn: Mild Impairment Step Over Obstacle: Mild Impairment Step Around Obstacles: Mild Impairment Steps: Mild Impairment Total Score: 19       Pertinent Vitals/Pain Pain Assessment: No/denies pain    Home Living Family/patient expects to be discharged to:: Private residence Living Arrangements: Alone Available Help at Discharge: Family;Available PRN/intermittently (separated from husband but states he can assist, also has local daughter who doesnt work) Type of Home: Apartment Home Access: Ramped entrance       [place]: One level [place]: [place];Cane - single point      Prior Function Prior Level of Function : Independent/Modified Independent;Driving;History of Falls (last six months) (4 falls in last 6 months)             Mobility Comments: pt reports mobilizing without AD in the home, use of cane in community. just getting back into exercise after 2 year hiatus ADLs Comments: reports no assist needed but that she "doesn't dress often"     Hand Dominance   Dominant Hand: Right    Extremity/Trunk Assessment   Upper Extremity Assessment Upper Extremity Assessment: Defer to OT evaluation    Lower Extremity Assessment Lower Extremity Assessment: Overall WFL for tasks assessed    Cervical / Trunk Assessment Cervical / Trunk Assessment: Normal  Communication    Communication: No difficulties  Cognition Arousal/Alertness: Awake/alert Behavior During Therapy: WFL for tasks assessed/performed Overall Cognitive Status: Impaired/Different from baseline Area of Impairment: Attention;Memory;Following commands;Safety/judgement;Problem solving;Awareness                   Current Attention Level: Selective Memory: Decreased short-term memory Following Commands: Follows one step commands consistently;Follows multi-step commands consistently Safety/Judgement: Decreased awareness of safety Awareness: Emergent Problem Solving: Slow processing General Comments: pt following all simple commands during session and able to complete multi-step commands without issue. at times tangential about past and family, but pleasant and agreeable        General Comments General comments (skin integrity, edema, etc.): VSS, BP stable in all positions and after activity    Exercises     Assessment/Plan    PT Assessment Patient needs continued PT services  PT Problem List Decreased strength;Decreased range of motion;Decreased activity tolerance;Decreased balance;Decreased mobility;Decreased safety awareness       PT Treatment Interventions DME instruction;Gait training;Functional mobility training;Stair training;Therapeutic activities;Therapeutic exercise;Balance training;Patient/family education    PT Goals (Current goals can be found in the Care Plan section)  Acute Rehab PT Goals Patient Stated Goal: return home PT Goal Formulation: With patient Time For Goal Achievement: 08/30/21 Potential to Achieve Goals: Good    Frequency Min 3X/week   Barriers to discharge Decreased caregiver support pt from home alone, states she has family that can stay with her after d/c    Co-evaluation  AM-PAC PT "6 Clicks" Mobility  Outcome Measure Help needed turning from your back to your side while in a flat bed without using bedrails?: None Help  needed moving from lying on your back to sitting on the side of a flat bed without using bedrails?: None Help needed moving to and from a bed to a chair (including a wheelchair)?: None Help needed standing up from a chair using your arms (e.g., wheelchair or bedside chair)?: A Little Help needed to walk in hospital room?: A Little Help needed climbing 3-5 steps with a railing? : A Little 6 Click Score: 21    End of Session Equipment Utilized During Treatment: Gait belt Activity Tolerance: Patient tolerated treatment well Patient left: in chair;with chair alarm set;with call bell/phone within reach Nurse Communication: Mobility status PT Visit Diagnosis: Other abnormalities of gait and mobility (R26.89);Repeated falls (R29.6);History of falling (Z91.81)    Time: 1121-6244 PT Time Calculation (min) (ACUTE ONLY): 43 min   Charges:   PT Evaluation $PT Eval Low Complexity: 1 Low PT Treatments $Gait Training: 8-22 mins $Therapeutic Activity: 8-22 mins        [place] Carbo, PT, DPT   Acute Rehabilitation Department Pager #: [phone]  Sandra Cockayne 08/16/2021, 9:06 AM

## 2021-08-16 NOTE — Progress Notes (Signed)
Carotid duplex and lower extremity venous has been completed.   Preliminary results in CV Proc.   Bridget Lane 08/16/2021 9:28 AM

## 2021-08-17 ENCOUNTER — Encounter (HOSPITAL_COMMUNITY): Payer: Self-pay

## 2021-08-17 DIAGNOSIS — R55 Syncope and collapse: Secondary | ICD-10-CM | POA: Diagnosis not present

## 2021-08-17 DIAGNOSIS — I480 Paroxysmal atrial fibrillation: Secondary | ICD-10-CM | POA: Diagnosis not present

## 2021-08-17 NOTE — Progress Notes (Signed)
  Mobility Specialist Criteria Algorithm Info.  Mobility Team: HOB elevated: Activity: Ambulated in hall; Dangled on edge of bed Range of motion: Active; All extremities Level of assistance: Standby assist, set-up cues, supervision of patient - no hands on Assistive device: None Minutes sitting in chair:  Minutes stood:  Minutes ambulated:  Distance ambulated (ft): 500 ft Mobility response: Tolerated well Bed Position: Semi-fowlers  Patient received dangling EOB agreed to participate in mobility with anticipation to get discharged. Ambulated in hallway at supervision with steady gait. Tolerated ambulation well without complaint or incident was left dangling EOB with all needs met.   08/17/2021 09:00 AM

## 2021-08-17 NOTE — TOC Transition Note (Signed)
Transition of Care [facility]) - CM/SW Discharge Note   Patient Details  Name: Bridget Lane MRN: [id number] Date of Birth: 1933/08/11  [facility] CM/SW Contact:  Zenon Mayo, RN Phone Number: 08/17/2021, 10:02 AM   Clinical Narrative:    NCM spoke with patient at bedside, she states she does not want this NCM to set up outpatient physical therapy, she will be going to [facility] to do therapy there and she does not want to pay 40.00 to go to outpatient therapy.  She states she has a scale at home, she does not have bp cuff but will look into buying one.  She states she eats a low sodium diet, she lives alone, and uses a cane at home.    Final next level of care: Home/Self Care Barriers to Discharge: No Barriers Identified   Patient Goals and CMS Choice     Choice offered to / list presented to : NA  Discharge Placement                       Discharge Plan and Services                  DME Agency: NA       HH Arranged: NA          Social Determinants of Health (SDOH) Interventions     Readmission Risk Interventions No flowsheet data found.

## 2021-08-17 NOTE — Plan of Care (Signed)
Unfortunately pt in process of discharging when we saw her around 27. No SI, HI, AH/VH. Now after discussion newly amenable to starting sertraline however dc orders signed - ensured daughter knew time of next PCP appt - can start it at that appt.    00000 no charge

## 2021-08-17 NOTE — Progress Notes (Signed)
Occupational Therapy Treatment Patient Details Name: Bridget Lane MRN: [id number] DOB: 27-Jul-1933 Today's Date: 08/17/2021   History of present illness The pt is an 87 yo female presenting 10/26 after an unwitnessed fall at [facility]. Undergoing continued workup for dementia vs bipolar, PMH includes: HTN, afib on Eliquis, CKD IIIa.   OT comments  OT treatment session with focus on higher level cognition and cognitive assessment relevant to completion of ADLs/IADLs. Patient scored 4/28 on SBT. Per assessment standards, scores of 1-4 are indicative of "normal" cognition. Patient reports taking 3 important prescriptions daily and many other pills including vitamins. Reports using a pillbox but reports some difficulty with managing small pills. Patient unable to recall how many pills she takes daily but reports that she "knows [her] medications well". Pillbox test (effective in differentiating graduated levels of executive dysfunction) completed this date with patient exceeding 8 errors of omission and misplaced movement. Patient also easily distracted requiring frequent redirection for completion of task. Executive functioning is a strong indicator of everyday function. Given patients deficits in planning/attention, decreased mental flexibility, and inability to multitask, recommendation for supervision A with med management. Daughter present at bedside at conclusion of OT treatment session. Results of Pillbox test discussed with patient/family. Patient strongly declining assist with medication management. D/c planned for later this date.     Recommendations for follow up therapy are one component of a multi-disciplinary discharge planning process, led by the attending physician.  Recommendations may be updated based on patient status, additional functional criteria and insurance authorization.    Follow Up Recommendations  Outpatient OT    Assistance Recommended at Discharge Frequent or constant  Supervision/Assistance  Equipment Recommendations  None recommended by OT    Recommendations for Other Services      Precautions / Restrictions Precautions Precautions: Fall Precaution Comments: reports multiple falls Restrictions Weight Bearing Restrictions: No       Mobility Bed Mobility                    Transfers Overall transfer level: Modified independent Equipment used: None                     Balance Overall balance assessment: History of Falls;Needs assistance Sitting-balance support: No upper extremity supported;Feet supported Sitting balance-Leahy Scale: Good     Standing balance support: No upper extremity supported;During functional activity Standing balance-Leahy Scale: Good                             ADL either performed or assessed with clinical judgement   ADL Overall ADL's : Modified independent                                             Vision   Vision Assessment?: No apparent visual deficits   Perception     Praxis      Cognition Arousal/Alertness: Awake/alert Behavior During Therapy:  (Loquacious) Overall Cognitive Status: History of cognitive impairments - at baseline                                 General Comments: Patient maxed out on errors on Pillbox test. Took more than increased time to complete task requiring frequent redirection. Scored 4/28 on SBT (idicates "normal  cognition".          Exercises     Shoulder Instructions       General Comments Daughter present at conclusion of OT session. Education provided on providing supervision A with med management. Patient not in agreement.    Pertinent Vitals/ Pain       Pain Assessment: No/denies pain  Home Living                                          Prior Functioning/Environment              Frequency  Min 2X/week        Progress Toward Goals  OT Goals(current goals can  now be found in the care plan section)  Progress towards OT goals: Progressing toward goals  Acute Rehab OT Goals Patient Stated Goal: To return home today. OT Goal Formulation: With patient Time For Goal Achievement: 08/30/21 Potential to Achieve Goals: Good ADL Goals Additional ADL Goal #1: Pt will participate in cognitive screening.  Plan Discharge plan remains appropriate;Frequency remains appropriate    Co-evaluation                 AM-PAC OT "6 Clicks" Daily Activity     Outcome Measure   Help from another person eating meals?: None Help from another person taking care of personal grooming?: None Help from another person toileting, which includes using toliet, bedpan, or urinal?: None Help from another person bathing (including washing, rinsing, drying)?: None Help from another person to put on and taking off regular upper body clothing?: None Help from another person to put on and taking off regular lower body clothing?: None 6 Click Score: 24    End of Session Equipment Utilized During Treatment: Gait belt  OT Visit Diagnosis: Unsteadiness on feet (R26.81);Other symptoms and signs involving cognitive function   Activity Tolerance Patient tolerated treatment well   Patient Left in chair;with call bell/phone within reach;with family/visitor present   Nurse Communication          Time: 1017-1040 OT Time Calculation (min): 23 min  Charges: OT General Charges $OT Visit: 1 Visit OT Treatments $Therapeutic Activity: 23-37 mins  Kamani Lewter H. OTR/L Supplemental OT, Department of rehab services [phone]  Kendrick Haapala R H. 08/17/2021, 10:48 AM

## 2021-08-17 NOTE — Discharge Instructions (Signed)
Advised to follow-up with primary care physician in 1 week. Advised to follow-up with ENT for nasal bone fractures. Advised to follow-up with neurologist as scheduled.

## 2021-08-17 NOTE — Progress Notes (Signed)
Physical Therapy Treatment Patient Details Name: Bridget Lane MRN: [id number] DOB: 1933/07/19 Today's Date: 08/17/2021   History of Present Illness The pt is an 87 yo female presenting 10/26 after an unwitnessed fall at [facility]. Undergoing continued workup for dementia vs bipolar, PMH includes: HTN, afib on Eliquis, CKD IIIa.    PT Comments    Per CM/SW note pt had refused OP PT referral. Pt ambulated with therapist and exhibit scissoring and minor LoB while trying to walk and talk to therapist at the same time. PT requested pt focus on ambulation and not talk and pt stability improved. Pt reports that she is aware of her tendency for decreased balance when "shopping with other people." Focus of session education for pt and daughter on level of dynamic balance deficits that might not adequately be addressed by trainer at the [facility]. PT recommending return to PT pt has already worked with to review HEP and get recommendation for cardio equipment that will be safe to use at [facility].  Pt in agreement and CM/SW contacted to set up referral.     Recommendations for follow up therapy are one component of a multi-disciplinary discharge planning process, led by the attending physician.  Recommendations may be updated based on patient status, additional functional criteria and insurance authorization.  Follow Up Recommendations  Outpatient PT (for balance)     Assistance Recommended at Discharge Intermittent Supervision/Assistance  Equipment Recommendations  Other (comment) (shower chair)       Precautions / Restrictions Precautions Precautions: Fall Precaution Comments: pt admitted for fall, 4 in last 6 months Restrictions Weight Bearing Restrictions: No     Mobility  Bed Mobility               General bed mobility comments: sitting EoB on entry    Transfers Overall transfer level: Modified independent Equipment used: None               General transfer comment: increased time  for power up    Ambulation/Gait Ambulation/Gait assistance: Supervision Gait Distance (Feet): 500 Feet Assistive device: None Gait Pattern/deviations: Step-through pattern;Decreased stride length;Scissoring Gait velocity: too fast for safety Gait velocity interpretation: 1.31 - 2.62 ft/sec, indicative of limited community ambulator General Gait Details: exhibits mild instability throughout however exhibits scissoring when trying to ambulate and talk at same time.   Stairs         General stair comments: alternating pattern with slow but steady steps. no LOB          Balance Overall balance assessment: History of Falls;Needs assistance Sitting-balance support: No upper extremity supported;Feet supported Sitting balance-Leahy Scale: Good     Standing balance support: No upper extremity supported;During functional activity Standing balance-Leahy Scale: Good                              Cognition Arousal/Alertness: Awake/alert Behavior During Therapy: WFL for tasks assessed/performed Overall Cognitive Status: History of cognitive impairments - at baseline Area of Impairment: Attention;Memory;Following commands;Safety/judgement;Problem solving;Awareness                   Current Attention Level: Selective Memory: Decreased short-term memory Following Commands: Follows one step commands consistently;Follows multi-step commands consistently Safety/Judgement: Decreased awareness of safety Awareness: Emergent Problem Solving: Requires verbal cues General Comments: pt very easily distracted, often by herself, requires increased cuing to keep mind on task at hand for safety  General Comments General comments (skin integrity, edema, etc.): Daughter present during session. provided increased education about pt instability with gait when distracted stability increased when pt concentrates on task at hand, Although pt refused OP PT agreeable to  return to PT she had seen previously to review her HEP and make suggestions for cardio machines to use at [facility]      Pertinent Vitals/Pain Pain Assessment: No/denies pain     PT Goals (current goals can now be found in the care plan section) Acute Rehab PT Goals Patient Stated Goal: return home PT Goal Formulation: With patient Time For Goal Achievement: 08/30/21 Potential to Achieve Goals: Good Progress towards PT goals: Progressing toward goals    Frequency    Min 3X/week      PT Plan Current plan remains appropriate       AM-PAC PT "6 Clicks" Mobility   Outcome Measure  Help needed turning from your back to your side while in a flat bed without using bedrails?: None Help needed moving from lying on your back to sitting on the side of a flat bed without using bedrails?: None Help needed moving to and from a bed to a chair (including a wheelchair)?: None Help needed standing up from a chair using your arms (e.g., wheelchair or bedside chair)?: A Little Help needed to walk in hospital room?: A Little Help needed climbing 3-5 steps with a railing? : A Little 6 Click Score: 21    End of Session Equipment Utilized During Treatment: Gait belt Activity Tolerance: Patient tolerated treatment well Patient left: Other (comment) (standing with daughter in room getting ready to discharge) Nurse Communication: Mobility status PT Visit Diagnosis: Other abnormalities of gait and mobility (R26.89);Repeated falls (R29.6);History of falling (Z91.81)     Time: 4665-9935 PT Time Calculation (min) (ACUTE ONLY): 21 min  Charges:  $Gait Training: 8-22 mins                     Bary Limbach B. Migdalia Dk PT, DPT Acute Rehabilitation Services Pager [phone] Office [phone]    [place] 08/17/2021, 11:26 AM

## 2021-08-17 NOTE — Discharge Summary (Signed)
Physician Discharge Summary  RAINN ZUPKO YTK:[id number] DOB: 09-09-1933 DOA: 08/15/2021  PCP: [name], MD  Admit date: 08/15/2021  Discharge date: 08/17/2021  Admitted From: Home. Disposition:  Home.  Recommendations for Outpatient Follow-up:  Follow up with PCP in 1-2 weeks. Please obtain BMP/CBC in one week. Advised to follow-up with ENT for nasal bone fractures. Advised to follow-up with Neurologist as scheduled.  Home Health: NONE Equipment/Devices: None  Discharge Condition: Stable CODE STATUS:Full code Diet recommendation: Heart Healthy   Brief Summary / Hospital course: This 87 years old female with PMH significant for breast CA, Atrial. Fibrillation on eliquis, hypertension, CKD stage IIIa, chronic anemia presented in the ED S/p fall with syncopal episode in [facility].  Patient denies any prodromal symptoms before passing out.  She seemed to be confused after episode.  Patient lives alone, denies any smoking or drinking.  Family reports episodes of confusion and memory loss and sometimes seeing things that are not there. Patient was admitted for syncopal episode, CT head showed acute minimally depressed bilateral nasal bone fractures with overlying soft tissue swelling.  No intracranial abnormality.  Echocardiogram normal LVEF, no valvular problems.  Venous duplex negative for any DVT.   Patient was seen by psychiatry, recommended outpatient psych follow-up for dementia, Patient denies any further hallucinations or delusions.  Patient feels better and wants to be discharged.  PT and OT recommended outpatient PT.  Patient is being discharged home.  She will follow-up with ENT and primary care physician as scheduled.  appointment has been made.  She was managed for below problems during hospital.    Discharge Diagnoses:  Active Problems:   Syncope and collapse   Syncope, vasovagal   A-fib (HCC)   Hypotension   AKI (acute kidney injury) ([place])   Fatigue    Hallucination  Syncope: Patient presented with syncope episode in the [facility]. She denies any prodromal symptoms before passing out. It could be due to dehydration, continue gentle hydration. CT head: No acute CVA, nasal bone fractures Continue to monitor on telemetry. 2D echo and carotid duplex unremarkable.   Hallucinations: Family reports visual and auditory hallucinations. Patient denies any hallucinations at present. Undiagnosed dementia versus bipolar disorder. Psychiatry consulted, recommended outpatient follow-up.   Atrial fibrillation: Heart rate is controlled, Continue flecainide and Eliquis. PCP has to discuss risks vs benefits of anticoagulation with frequent falls.   Breast cancer Continue Femara.   Hypotension: Possibly could be due to dehydration, Continue gentle hydration. BP improved.   Acute kidney injury on CKD stage IIIA: Unknown baseline serum creatinine.  Serum creatinine at admission 1.7 Serum creatinine back to baseline.   Possible dementia: Family reports increased forgetfulness, needs outpatient neuro follow-up.   Nasal bone fracture: Continue pain control, outpatient ENT follow-up   Chronic anemia: H&H is a stable, continue to monitor.:   Generalized weakness: PT and OT evaluation   Discharge Instructions  Discharge Instructions     Ambulatory referral to Neurology   Complete by: As directed    An appointment is requested in approximately: 4 weeks   Call MD for:  difficulty breathing, headache or visual disturbances   Complete by: As directed    Call MD for:  persistant dizziness or light-headedness   Complete by: As directed    Call MD for:  persistant nausea and vomiting   Complete by: As directed    Diet - low sodium heart healthy   Complete by: As directed    Diet - low sodium heart  healthy   Complete by: As directed    Discharge instructions   Complete by: As directed    Advised to follow-up with primary care physician in 1  week. Advised to follow-up with ENT for nasal bone fractures. Advised to follow-up with neurologist as scheduled.   Discharge wound care:   Complete by: As directed    Follow up ENT   Discharge wound care:   Complete by: As directed    Follow-up PCP in 1 week.   Increase activity slowly   Complete by: As directed    Increase activity slowly   Complete by: As directed       Allergies as of 08/17/2021       Reactions   Codeine Other (See Comments)   nightmares   Dabigatran Etexilate Mesylate    Unk reaction   Penicillins Rash   Sulfa Antibiotics Rash        Medication List     TAKE these medications    clobetasol 0.05 % external solution Commonly known as: TEMOVATE Apply 1 application topically 3 (three) times a week.   DULoxetine 30 MG capsule Commonly known as: CYMBALTA Take 60 mg by mouth 2 (two) times daily.   Eliquis 5 MG Tabs tablet Generic drug: apixaban Take 5 mg by mouth 2 (two) times daily.   flecainide 100 MG tablet Commonly known as: TAMBOCOR Take 100 mg by mouth 2 (two) times daily.   furosemide 20 MG tablet Commonly known as: LASIX Take 20 mg by mouth daily.   letrozole 2.5 MG tablet Commonly known as: FEMARA Take 2.5 mg by mouth daily.   metoprolol tartrate 25 MG tablet Commonly known as: LOPRESSOR Take 25 mg by mouth 2 (two) times daily.   Multi For Her 50+ Tabs Take 1 tablet by mouth daily.   PRESERVISION AREDS 2+MULTI VIT PO Take 1 tablet by mouth in the morning and at bedtime.   omeprazole 40 MG capsule Commonly known as: PRILOSEC Take 40 mg by mouth 2 (two) times daily.               Discharge Care Instructions  (From admission, onward)           Start     Ordered   08/17/21 0000  Discharge wound care:       Comments: Follow up ENT   08/17/21 0920   08/17/21 0000  Discharge wound care:       Comments: Follow-up PCP in 1 week.   08/17/21 1058            Follow-up Information     [name],  MD. Go on 09/03/2021.   Specialty: Family Medicine Why: @3 :40pm Contact information: [place] [address] [phone]         Izora Gala, MD Follow up in 1 week(s).   Specialty: Otolaryngology Contact information: [address] [place] [address] [phone]         [place] Follow up in 4 week(s).   Contact information: [address]     [address] [place] [address] [phone]               Allergies  Allergen Reactions   Codeine Other (See Comments)    nightmares   Dabigatran Etexilate Mesylate     Unk reaction   Penicillins Rash   Sulfa Antibiotics Rash    Consultations: None   Procedures/Studies: CT Head Wo Contrast  Result Date: 08/15/2021 CLINICAL DATA:  Fall.  Facial trauma EXAM: CT HEAD WITHOUT CONTRAST CT MAXILLOFACIAL WITHOUT CONTRAST CT CERVICAL SPINE WITHOUT CONTRAST TECHNIQUE: Multidetector CT imaging of the head, cervical spine, and maxillofacial structures were performed using the standard protocol without intravenous contrast. Multiplanar CT image reconstructions of the cervical spine and maxillofacial structures were also generated. COMPARISON:  03/23/2020 FINDINGS: CT HEAD FINDINGS Brain: No evidence of acute infarction, hemorrhage, hydrocephalus, extra-axial collection or mass lesion/mass effect. Scattered low-density changes within the periventricular and subcortical white matter compatible with chronic microvascular ischemic change. Mild diffuse cerebral volume loss. Vascular: Atherosclerotic calcifications involving the large vessels of the skull base. No unexpected hyperdense vessel. Skull: Normal. Negative for fracture or focal lesion. Other: Negative for scalp hematoma. CT MAXILLOFACIAL FINDINGS Osseous: Acute minimally depressed bilateral nasal bone fractures. No additional acute maxillofacial bone fracture. Bony orbital walls are intact. Mandible  intact. Temporomandibular joints are aligned without dislocation. The degenerative changes of the bilateral temporomandibular joints. Orbits: Negative. No traumatic or inflammatory finding. Sinuses: Clear. Soft tissues: Soft tissue swelling over the nasal bridge. No facial hematoma. CT CERVICAL SPINE FINDINGS Alignment: Facet joints are aligned without dislocation or traumatic listhesis. Dens and lateral masses are aligned. Skull base and vertebrae: Postsurgical changes from prior C4-C6 ACDF with solid arthrodesis. No evidence of acute fracture. No destructive bone lesion. Soft tissues and spinal canal: No prevertebral fluid or swelling. No visible canal hematoma. Disc levels: Adjacent segment disease at C3-4 and C6-7. Multilevel bilateral facet arthropathy. Prominent degenerative changes at the C1-2 articulation on the left. Upper chest: Biapical pleuroparenchymal scarring. Other: Bilateral carotid atherosclerosis. IMPRESSION: 1. No acute intracranial findings. 2. Acute minimally depressed bilateral nasal bone fractures with overlying soft tissue swelling. 3. No evidence for acute traumatic injury to the cervical spine. 4. Postsurgical changes from prior C4-C6 ACDF with solid arthrodesis. Electronically Signed   By: Davina Poke D.O.   On: 08/15/2021 16:06   CT Cervical Spine Wo Contrast  Result Date: 08/15/2021 CLINICAL DATA:  Fall.  Facial trauma EXAM: CT HEAD WITHOUT CONTRAST CT MAXILLOFACIAL WITHOUT CONTRAST CT CERVICAL SPINE WITHOUT CONTRAST TECHNIQUE: Multidetector CT imaging of the head, cervical spine, and maxillofacial structures were performed using the standard protocol without intravenous contrast. Multiplanar CT image reconstructions of the cervical spine and maxillofacial structures were also generated. COMPARISON:  03/23/2020 FINDINGS: CT HEAD FINDINGS Brain: No evidence of acute infarction, hemorrhage, hydrocephalus, extra-axial collection or mass lesion/mass effect. Scattered low-density  changes within the periventricular and subcortical white matter compatible with chronic microvascular ischemic change. Mild diffuse cerebral volume loss. Vascular: Atherosclerotic calcifications involving the large vessels of the skull base. No unexpected hyperdense vessel. Skull: Normal. Negative for fracture or focal lesion. Other: Negative for scalp hematoma. CT MAXILLOFACIAL FINDINGS Osseous: Acute minimally depressed bilateral nasal bone fractures. No additional acute maxillofacial bone fracture. Bony orbital walls are intact. Mandible intact. Temporomandibular joints are aligned without dislocation. The degenerative changes of the bilateral temporomandibular joints. Orbits: Negative. No traumatic or inflammatory finding. Sinuses: Clear. Soft tissues: Soft tissue swelling over the nasal bridge. No facial hematoma. CT CERVICAL SPINE FINDINGS Alignment: Facet joints are aligned without dislocation or traumatic listhesis. Dens and lateral masses are aligned. Skull base and vertebrae: Postsurgical changes from prior C4-C6 ACDF with solid arthrodesis. No evidence of acute fracture. No destructive bone lesion. Soft tissues and spinal canal: No prevertebral fluid or swelling. No visible canal hematoma. Disc levels: Adjacent segment disease at C3-4 and C6-7. Multilevel bilateral facet arthropathy. Prominent degenerative changes  at the C1-2 articulation on the left. Upper chest: Biapical pleuroparenchymal scarring. Other: Bilateral carotid atherosclerosis. IMPRESSION: 1. No acute intracranial findings. 2. Acute minimally depressed bilateral nasal bone fractures with overlying soft tissue swelling. 3. No evidence for acute traumatic injury to the cervical spine. 4. Postsurgical changes from prior C4-C6 ACDF with solid arthrodesis. Electronically Signed   By: Davina Poke D.O.   On: 08/15/2021 16:06   DG Pelvis Portable  Result Date: 08/15/2021 CLINICAL DATA:  Status post fall. EXAM: PORTABLE PELVIS 1-2 VIEWS  COMPARISON:  None. FINDINGS: There is no evidence of pelvic fracture or diastasis. No pelvic bone lesions are seen. IMPRESSION: Negative. Electronically Signed   By: Iven Finn M.D.   On: 08/15/2021 17:52   DG Chest Port 1 View  Result Date: 08/15/2021 CLINICAL DATA:  Status post fall EXAM: PORTABLE CHEST 1 VIEW.  Patient is rotated. COMPARISON:  None. FINDINGS: The heart and mediastinal contours are within grossly normal limits. No focal consolidation. No pulmonary edema. No pleural effusion. No pneumothorax. No acute osseous abnormality. IMPRESSION: No active disease. Electronically Signed   By: Iven Finn M.D.   On: 08/15/2021 17:53   DG Hand Complete Right  Result Date: 08/15/2021 CLINICAL DATA:  Fall with right hand pain EXAM: RIGHT HAND - COMPLETE 3+ VIEW COMPARISON:  None. FINDINGS: No acute displaced fracture or malalignment. Advanced arthritis at the D IP joints with mild arthritis at the PIP and first and second MCP joints. Moderate arthritis at the first [facility] joint and STT interval. IMPRESSION: No acute osseous abnormality. Electronically Signed   By: Donavan Foil M.D.   On: 08/15/2021 16:53   ECHOCARDIOGRAM COMPLETE  Result Date: 08/16/2021    ECHOCARDIOGRAM REPORT   Patient Name:   DEVYNNE STURDIVANT Date of Exam: 08/16/2021 Medical Rec #:  [id number]       Height:       64.0 in Accession #:    [id number]      Weight:       158.8 lb Date of Birth:  24-Aug-1933       BSA:          1.774 m Patient Age:    46 years        BP:           112/71 mmHg Patient Gender: F               HR:           70 bpm. Exam Location:  Inpatient Procedure: 2D Echo, 3D Echo, Cardiac Doppler, Color Doppler and Strain Analysis Indications:    Syncope R55  History:        Patient has prior history of Echocardiogram examinations.                 Arrythmias:Atrial Fibrillation; Risk Factors:Hypertension.                 Chronic kidney disease, anemia.  Sonographer:    Darlina Sicilian RDCS Referring Phys: [place]  1. Left ventricular ejection fraction, by estimation, is 60 to 65%. Left ventricular ejection fraction by 3D volume is 56 %. The left ventricle has normal function. The left ventricle has no regional wall motion abnormalities. Left ventricular diastolic  parameters were normal. The average left ventricular global longitudinal strain is -20.9 %. The global longitudinal strain is normal.  2. Right ventricular systolic function is normal. The right ventricular size is normal. There is normal pulmonary artery  systolic pressure.  3. The mitral valve is normal in structure. No evidence of mitral valve regurgitation. No evidence of mitral stenosis.  4. The aortic valve is normal in structure. Aortic valve regurgitation is trivial. No aortic stenosis is present.  5. The inferior vena cava is normal in size with greater than 50% respiratory variability, suggesting right atrial pressure of 3 mmHg. FINDINGS  Left Ventricle: Left ventricular ejection fraction, by estimation, is 60 to 65%. Left ventricular ejection fraction by 3D volume is 56 %. The left ventricle has normal function. The left ventricle has no regional wall motion abnormalities. The average left ventricular global longitudinal strain is -20.9 %. The global longitudinal strain is normal. The left ventricular internal cavity size was normal in size. There is no left ventricular hypertrophy. Left ventricular diastolic parameters were normal. Normal left ventricular filling pressure. Right Ventricle: The right ventricular size is normal. No increase in right ventricular wall thickness. Right ventricular systolic function is normal. There is normal pulmonary artery systolic pressure. The tricuspid regurgitant velocity is 2.31 m/s, and  with an assumed right atrial pressure of 3 mmHg, the estimated right ventricular systolic pressure is 32.6 mmHg. Left Atrium: Left atrial size was normal in size. Right Atrium: Right atrial size was  normal in size. Pericardium: There is no evidence of pericardial effusion. Mitral Valve: The mitral valve is normal in structure. No evidence of mitral valve regurgitation. No evidence of mitral valve stenosis. Tricuspid Valve: The tricuspid valve is normal in structure. Tricuspid valve regurgitation is trivial. No evidence of tricuspid stenosis. Aortic Valve: The aortic valve is normal in structure. Aortic valve regurgitation is trivial. No aortic stenosis is present. Pulmonic Valve: The pulmonic valve was normal in structure. Pulmonic valve regurgitation is mild. No evidence of pulmonic stenosis. Aorta: The aortic root is normal in size and structure. Venous: The inferior vena cava is normal in size with greater than 50% respiratory variability, suggesting right atrial pressure of 3 mmHg. IAS/Shunts: No atrial level shunt detected by color flow Doppler.  LEFT VENTRICLE PLAX 2D LVIDd:         4.40 cm         Diastology LVIDs:         3.30 cm         LV e' medial:    9.08 cm/s LV PW:         1.00 cm         LV E/e' medial:  8.4 LV IVS:        0.90 cm         LV e' lateral:   11.00 cm/s LVOT diam:     2.10 cm         LV E/e' lateral: 6.9 LV SV:         46 LV SV Index:   26              2D LVOT Area:     3.46 cm        Longitudinal                                Strain                                2D Strain GLS  -19.0 %                                (  A2C):                                2D Strain GLS  -23.1 %                                (A3C):                                2D Strain GLS  -20.5 %                                (A4C):                                2D Strain GLS  -20.9 %                                Avg:                                 3D Volume EF                                LV 3D EF:    Left                                             ventricul                                             ar                                             ejection                                              fraction                                             by 3D                                             volume is                                             56 %.  3D Volume EF:                                3D EF:        56 %                                LV EDV:       118 ml                                LV ESV:       52 ml                                LV SV:        66 ml RIGHT VENTRICLE RV S prime:     11.00 cm/s TAPSE (M-mode): 1.8 cm LEFT ATRIUM             Index        RIGHT ATRIUM           Index LA diam:        3.50 cm 1.97 cm/m   RA Area:     13.60 cm LA Vol (A2C):   28.6 ml 16.12 ml/m  RA Volume:   29.70 ml  16.74 ml/m LA Vol (A4C):   34.2 ml 19.28 ml/m LA Biplane Vol: 32.0 ml 18.04 ml/m  AORTIC VALVE LVOT Vmax:   71.70 cm/s LVOT Vmean:  48.400 cm/s LVOT VTI:    0.132 m  AORTA Ao Root diam: 3.30 cm Ao Asc diam:  3.10 cm MITRAL VALVE               TRICUSPID VALVE MV Area (PHT): 3.19 cm    TR Peak grad:   21.3 mmHg MV Decel Time: 238 msec    TR Vmax:        231.00 cm/s MV E velocity: 76.10 cm/s MV A velocity: 65.40 cm/s  SHUNTS MV E/A ratio:  1.16        Systemic VTI:  0.13 m                            Systemic Diam: 2.10 cm Fransico Him MD Electronically signed by Fransico Him MD Signature Date/Time: 08/16/2021/9:55:58 AM    Final    VAS US CAROTID  Result Date: 08/16/2021 Carotid Arterial Duplex Study Patient Name:  GEARLENE GODSIL  Date of Exam:   08/16/2021 Medical Rec #: [id number]        Accession #:    [id number] Date of Birth: 1932-11-24        Patient Gender: F Patient Age:   21 years Exam Location:  [facility] Procedure:      VAS US CAROTID Referring Phys: Nyoka Lint DOUTOVA --------------------------------------------------------------------------------  Indications:       Syncope. Comparison Study:  no prior Performing Technologist: Archie Patten RVS  Examination Guidelines: A complete evaluation includes B-mode imaging, spectral Doppler,  color Doppler, and power Doppler as needed of all accessible portions of each vessel. Bilateral testing is considered an integral part of a complete examination. Limited examinations for reoccurring indications may be performed as noted.  Right Carotid Findings: +----------+--------+--------+--------+------------------+--------+  PSV cm/sEDV cm/sStenosisPlaque DescriptionComments +----------+--------+--------+--------+------------------+--------+ CCA Prox  82      13              heterogenous               +----------+--------+--------+--------+------------------+--------+ CCA Distal68      17              heterogenous               +----------+--------+--------+--------+------------------+--------+ ICA Prox  64      24      1-39%   heterogenous               +----------+--------+--------+--------+------------------+--------+ ICA Distal82      25                                         +----------+--------+--------+--------+------------------+--------+ ECA       52      7                                          +----------+--------+--------+--------+------------------+--------+ +----------+--------+-------+--------+-------------------+           PSV cm/sEDV cmsDescribeArm Pressure (mmHG) +----------+--------+-------+--------+-------------------+ [id number]                                         +----------+--------+-------+--------+-------------------+ +---------+--------+--+--------+--+---------+ VertebralPSV cm/s66EDV cm/s17Antegrade +---------+--------+--+--------+--+---------+  Left Carotid Findings: +----------+--------+--------+--------+------------------+--------+           PSV cm/sEDV cm/sStenosisPlaque DescriptionComments +----------+--------+--------+--------+------------------+--------+ CCA Prox  75      19              heterogenous               +----------+--------+--------+--------+------------------+--------+ CCA  Distal58      14              heterogenous               +----------+--------+--------+--------+------------------+--------+ ICA Prox  85      29      1-39%   heterogenous               +----------+--------+--------+--------+------------------+--------+ ICA Distal78      26                                         +----------+--------+--------+--------+------------------+--------+ ECA       63      13                                         +----------+--------+--------+--------+------------------+--------+ +----------+--------+--------+--------+-------------------+           PSV cm/sEDV cm/sDescribeArm Pressure (mmHG) +----------+--------+--------+--------+-------------------+ [id number]                                          +----------+--------+--------+--------+-------------------+ +---------+--------+--+--------+--+---------+ VertebralPSV cm/s59EDV cm/s21Antegrade +---------+--------+--+--------+--+---------+   Summary: Right Carotid: Velocities in the right ICA are consistent with a 1-39% stenosis.  Left Carotid: Velocities in the left ICA are consistent with a 1-39% stenosis. Vertebrals: Bilateral vertebral arteries demonstrate antegrade flow. *See table(s) above for measurements and observations.  Electronically signed by Orlie Pollen on 08/16/2021 at 6:44:53 PM.    Final    VAS [place] LOWER EXTREMITY VENOUS (DVT)  Result Date: 08/16/2021  Lower Venous DVT Study Patient Name:  EDNA REDE  Date of Exam:   08/16/2021 Medical Rec #: [id number]        Accession #:    [id number] Date of Birth: [birth year]        Patient Gender: F Patient Age:   22 years Exam Location:  [facility] Procedure:      VAS [place] LOWER EXTREMITY VENOUS (DVT) Referring Phys: Nyoka Lint DOUTOVA --------------------------------------------------------------------------------  Indications: Edema.  Comparison Study: no prior Performing Technologist: Archie Patten RVS  Examination  Guidelines: A complete evaluation includes B-mode imaging, spectral Doppler, color Doppler, and power Doppler as needed of all accessible portions of each vessel. Bilateral testing is considered an integral part of a complete examination. Limited examinations for reoccurring indications may be performed as noted. The reflux portion of the exam is performed with the patient in reverse Trendelenburg.  +---------+---------------+---------+-----------+----------+--------------+ RIGHT    CompressibilityPhasicitySpontaneityPropertiesThrombus Aging +---------+---------------+---------+-----------+----------+--------------+ CFV      Full           Yes      Yes                                 +---------+---------------+---------+-----------+----------+--------------+ SFJ      Full                                                        +---------+---------------+---------+-----------+----------+--------------+ FV Prox  Full                                                        +---------+---------------+---------+-----------+----------+--------------+ FV Mid   Full                                                        +---------+---------------+---------+-----------+----------+--------------+ FV DistalFull                                                        +---------+---------------+---------+-----------+----------+--------------+ PFV      Full                                                        +---------+---------------+---------+-----------+----------+--------------+ POP      Full           Yes  Yes                                 +---------+---------------+---------+-----------+----------+--------------+ PTV      Full                                                        +---------+---------------+---------+-----------+----------+--------------+ PERO     Full                                                         +---------+---------------+---------+-----------+----------+--------------+   +---------+---------------+---------+-----------+----------+--------------+ LEFT     CompressibilityPhasicitySpontaneityPropertiesThrombus Aging +---------+---------------+---------+-----------+----------+--------------+ CFV      Full           Yes      Yes                                 +---------+---------------+---------+-----------+----------+--------------+ SFJ      Full                                                        +---------+---------------+---------+-----------+----------+--------------+ FV Prox  Full                                                        +---------+---------------+---------+-----------+----------+--------------+ FV Mid   Full                                                        +---------+---------------+---------+-----------+----------+--------------+ FV DistalFull                                                        +---------+---------------+---------+-----------+----------+--------------+ PFV      Full                                                        +---------+---------------+---------+-----------+----------+--------------+ POP      Full           Yes      Yes                                 +---------+---------------+---------+-----------+----------+--------------+ PTV      Full                                                        +---------+---------------+---------+-----------+----------+--------------+  PERO     Full                                                        +---------+---------------+---------+-----------+----------+--------------+     Summary: BILATERAL: - No evidence of deep vein thrombosis seen in the lower extremities, bilaterally. -No evidence of popliteal cyst, bilaterally.   *See table(s) above for measurements and observations. Electronically signed by Orlie Pollen on 08/16/2021 at 6:44:35 PM.     Final    CT Maxillofacial Wo Contrast  Result Date: 08/15/2021 CLINICAL DATA:  Fall.  Facial trauma EXAM: CT HEAD WITHOUT CONTRAST CT MAXILLOFACIAL WITHOUT CONTRAST CT CERVICAL SPINE WITHOUT CONTRAST TECHNIQUE: Multidetector CT imaging of the head, cervical spine, and maxillofacial structures were performed using the standard protocol without intravenous contrast. Multiplanar CT image reconstructions of the cervical spine and maxillofacial structures were also generated. COMPARISON:  03/23/2020 FINDINGS: CT HEAD FINDINGS Brain: No evidence of acute infarction, hemorrhage, hydrocephalus, extra-axial collection or mass lesion/mass effect. Scattered low-density changes within the periventricular and subcortical white matter compatible with chronic microvascular ischemic change. Mild diffuse cerebral volume loss. Vascular: Atherosclerotic calcifications involving the large vessels of the skull base. No unexpected hyperdense vessel. Skull: Normal. Negative for fracture or focal lesion. Other: Negative for scalp hematoma. CT MAXILLOFACIAL FINDINGS Osseous: Acute minimally depressed bilateral nasal bone fractures. No additional acute maxillofacial bone fracture. Bony orbital walls are intact. Mandible intact. Temporomandibular joints are aligned without dislocation. The degenerative changes of the bilateral temporomandibular joints. Orbits: Negative. No traumatic or inflammatory finding. Sinuses: Clear. Soft tissues: Soft tissue swelling over the nasal bridge. No facial hematoma. CT CERVICAL SPINE FINDINGS Alignment: Facet joints are aligned without dislocation or traumatic listhesis. Dens and lateral masses are aligned. Skull base and vertebrae: Postsurgical changes from prior C4-C6 ACDF with solid arthrodesis. No evidence of acute fracture. No destructive bone lesion. Soft tissues and spinal canal: No prevertebral fluid or swelling. No visible canal hematoma. Disc levels: Adjacent segment disease at C3-4 and  C6-7. Multilevel bilateral facet arthropathy. Prominent degenerative changes at the C1-2 articulation on the left. Upper chest: Biapical pleuroparenchymal scarring. Other: Bilateral carotid atherosclerosis. IMPRESSION: 1. No acute intracranial findings. 2. Acute minimally depressed bilateral nasal bone fractures with overlying soft tissue swelling. 3. No evidence for acute traumatic injury to the cervical spine. 4. Postsurgical changes from prior C4-C6 ACDF with solid arthrodesis. Electronically Signed   By: Davina Poke D.O.   On: 08/15/2021 16:06    CT head, echocardiogram, carotid duplex all unremarkable.   Subjective: Patient was seen and examined at bedside.  Overnight events noted.   Patient reports feeling much improved.  She wants to be discharged.  Patient is being discharged home.  Discharge Exam: Vitals:   08/17/21 0008 08/17/21 0333  BP: 127/80 (!) 142/44  Pulse: 72 86  Resp: 17 17  Temp: 98.1 F (36.7 C) 97.8 F (36.6 C)  SpO2: 100% 98%   Vitals:   08/16/21 1544 08/16/21 1959 08/17/21 0008 08/17/21 0333  BP: (!) 138/93 (!) 143/70 127/80 (!) 142/44  Pulse: 76 80 72 86  Resp: 20 18 17 17   Temp: 98.3 F (36.8 C) 97.9 F (36.6 C) 98.1 F (36.7 C) 97.8 F (36.6 C)  TempSrc: Oral Oral Oral Oral  SpO2: 98% 98% 100% 98%  Weight:   73.1 kg  Height:        General: Appears comfortable, not in any acute distress.  Stitches noted on the chin.  Bruises noted on the nasal bone. Cardiovascular: RRR, S1/S2 +, no rubs, no gallops Respiratory: CTA bilaterally, no wheezing, no rhonchi Abdominal: Soft, NT, ND, bowel sounds + Extremities: no edema, no cyanosis    The results of significant diagnostics from this hospitalization (including imaging, microbiology, ancillary and laboratory) are listed below for reference.     Microbiology: Recent Results (from the past 240 hour(s))  Resp Panel by RT-PCR (Flu A&B, Covid) Nasopharyngeal Swab     Status: None   Collection Time:  08/15/21  3:35 PM   Specimen: Nasopharyngeal Swab; Nasopharyngeal(NP) swabs in vial transport medium  Result Value Ref Range Status   SARS Coronavirus 2 by RT PCR NEGATIVE NEGATIVE Final    Comment: (NOTE) SARS-CoV-2 target nucleic acids are NOT DETECTED.  The SARS-CoV-2 RNA is generally detectable in upper respiratory specimens during the acute phase of infection. The lowest concentration of SARS-CoV-2 viral copies this assay can detect is 138 copies/mL. A negative result does not preclude SARS-Cov-2 infection and should not be used as the sole basis for treatment or other patient management decisions. A negative result may occur with  improper specimen collection/handling, submission of specimen other than nasopharyngeal swab, presence of viral mutation(s) within the areas targeted by this assay, and inadequate number of viral copies(<138 copies/mL). A negative result must be combined with clinical observations, patient history, and epidemiological information. The expected result is Negative.  Fact Sheet for Patients:  EntrepreneurPulse.com.au  Fact Sheet for Healthcare Providers:  IncredibleEmployment.be  This test is no t yet approved or cleared by the [place] FDA and  has been authorized for detection and/or diagnosis of SARS-CoV-2 by FDA under an Emergency Use Authorization (EUA). This EUA will remain  in effect (meaning this test can be used) for the duration of the COVID-19 declaration under Section 564(b)(1) of the Act, 21 U.S.C.section 360bbb-3(b)(1), unless the authorization is terminated  or revoked sooner.       Influenza A by PCR NEGATIVE NEGATIVE Final   Influenza B by PCR NEGATIVE NEGATIVE Final    Comment: (NOTE) The Xpert Xpress SARS-CoV-2/FLU/RSV plus assay is intended as an aid in the diagnosis of influenza from Nasopharyngeal swab specimens and should not be used as a sole basis for treatment. Nasal washings  and aspirates are unacceptable for Xpert Xpress SARS-CoV-2/FLU/RSV testing.  Fact Sheet for Patients: EntrepreneurPulse.com.au  Fact Sheet for Healthcare Providers: IncredibleEmployment.be  This test is not yet approved or cleared by the [place] FDA and has been authorized for detection and/or diagnosis of SARS-CoV-2 by FDA under an Emergency Use Authorization (EUA). This EUA will remain in effect (meaning this test can be used) for the duration of the COVID-19 declaration under Section 564(b)(1) of the Act, 21 U.S.C. section 360bbb-3(b)(1), unless the authorization is terminated or revoked.  Performed at [facility], [place] [address] [place], [place] [address]      Labs: BNP (last 3 results) No results for input(s): BNP in the last 8760 hours. Basic Metabolic Panel: Recent Labs  Lab 08/15/21 1515 08/15/21 1611 08/15/21 2154 08/16/21 0307  NA 138 140  --  138  K 4.2 4.2  --  3.8  CL 102 102  --  107  CO2 27  --   --  25  GLUCOSE 107* 103*  --  96  BUN 30* 31*  --  22  CREATININE 1.70* 1.80*  --  1.40*  CALCIUM 9.4  --   --  8.6*  MG  --   --  1.8 1.8  PHOS  --   --  3.6 3.9   Liver Function Tests: Recent Labs  Lab 08/15/21 1515 08/16/21 0307  AST 23 20  ALT 16 13  ALKPHOS 76 63  BILITOT 0.9 0.7  PROT 6.3* 5.3*  ALBUMIN 3.5 3.0*   No results for input(s): LIPASE, AMYLASE in the last 168 hours. Recent Labs  Lab 08/15/21 1531  AMMONIA 19   CBC: Recent Labs  Lab 08/15/21 1515 08/15/21 1611 08/16/21 0307  WBC 6.4  --  7.6  NEUTROABS 3.5  --  4.2  HGB 11.4* 11.9* 10.2*  HCT 36.1 35.0* 31.4*  MCV 92.1  --  88.2  PLT 292  --  266   Cardiac Enzymes: Recent Labs  Lab 08/15/21 2154  CKTOTAL 86   BNP: Invalid input(s): POCBNP CBG: No results for input(s): GLUCAP in the last 168 hours. D-Dimer Recent Labs    08/15/21 2154  DDIMER 0.56*   Hgb A1c No results for input(s): HGBA1C in the last 72  hours. Lipid Profile No results for input(s): CHOL, HDL, LDLCALC, TRIG, CHOLHDL, LDLDIRECT in the last 72 hours. Thyroid function studies Recent Labs    08/16/21 0307  TSH 0.571   Anemia work up Recent Labs    08/16/21 0307  VITAMINB12 278  FOLATE 30.4  FERRITIN 28  TIBC 323  IRON 45  RETICCTPCT 1.1   Urinalysis    Component Value Date/Time   COLORURINE YELLOW 08/15/2021 1531   APPEARANCEUR HAZY (A) 08/15/2021 1531   LABSPEC 1.008 08/15/2021 1531   PHURINE 6.0 08/15/2021 1531   GLUCOSEU NEGATIVE 08/15/2021 1531   [place] 08/15/2021 1531   [place] 08/15/2021 1531   KETONESUR NEGATIVE 08/15/2021 1531   PROTEINUR NEGATIVE 08/15/2021 1531   NITRITE NEGATIVE 08/15/2021 1531   LEUKOCYTESUR LARGE (A) 08/15/2021 1531   Sepsis Labs Invalid input(s): PROCALCITONIN,  WBC,  LACTICIDVEN Microbiology Recent Results (from the past 240 hour(s))  Resp Panel by RT-PCR (Flu A&B, Covid) Nasopharyngeal Swab     Status: None   Collection Time: 08/15/21  3:35 PM   Specimen: Nasopharyngeal Swab; Nasopharyngeal(NP) swabs in vial transport medium  Result Value Ref Range Status   SARS Coronavirus 2 by RT PCR NEGATIVE NEGATIVE Final    Comment: (NOTE) SARS-CoV-2 target nucleic acids are NOT DETECTED.  The SARS-CoV-2 RNA is generally detectable in upper respiratory specimens during the acute phase of infection. The lowest concentration of SARS-CoV-2 viral copies this assay can detect is 138 copies/mL. A negative result does not preclude SARS-Cov-2 infection and should not be used as the sole basis for treatment or other patient management decisions. A negative result may occur with  improper specimen collection/handling, submission of specimen other than nasopharyngeal swab, presence of viral mutation(s) within the areas targeted by this assay, and inadequate number of viral copies(<138 copies/mL). A negative result must be combined with clinical observations, patient  history, and epidemiological information. The expected result is Negative.  Fact Sheet for Patients:  EntrepreneurPulse.com.au  Fact Sheet for Healthcare Providers:  IncredibleEmployment.be  This test is no t yet approved or cleared by the [place] FDA and  has been authorized for detection and/or diagnosis of SARS-CoV-2 by FDA under an Emergency Use Authorization (EUA). This EUA will remain  in effect (meaning this test can be used) for the duration  of the COVID-19 declaration under Section 564(b)(1) of the Act, 21 U.S.C.section 360bbb-3(b)(1), unless the authorization is terminated  or revoked sooner.       Influenza A by PCR NEGATIVE NEGATIVE Final   Influenza B by PCR NEGATIVE NEGATIVE Final    Comment: (NOTE) The Xpert Xpress SARS-CoV-2/FLU/RSV plus assay is intended as an aid in the diagnosis of influenza from Nasopharyngeal swab specimens and should not be used as a sole basis for treatment. Nasal washings and aspirates are unacceptable for Xpert Xpress SARS-CoV-2/FLU/RSV testing.  Fact Sheet for Patients: EntrepreneurPulse.com.au  Fact Sheet for Healthcare Providers: IncredibleEmployment.be  This test is not yet approved or cleared by the [place] FDA and has been authorized for detection and/or diagnosis of SARS-CoV-2 by FDA under an Emergency Use Authorization (EUA). This EUA will remain in effect (meaning this test can be used) for the duration of the COVID-19 declaration under Section 564(b)(1) of the Act, 21 U.S.C. section 360bbb-3(b)(1), unless the authorization is terminated or revoked.  Performed at [facility], [place] [address] [place], [place] [address]      Time coordinating discharge: Over 30 minutes  SIGNED:   Shawna Clamp, MD  [facility] 08/17/2021, 11:01 AM Pager   If 7PM-7AM, please contact night-coverage

## 2021-08-21 DIAGNOSIS — S022XXA Fracture of nasal bones, initial encounter for closed fracture: Secondary | ICD-10-CM | POA: Diagnosis not present

## 2021-08-21 DIAGNOSIS — S0181XA Laceration without foreign body of other part of head, initial encounter: Secondary | ICD-10-CM | POA: Diagnosis not present

## 2021-08-27 NOTE — Progress Notes (Signed)
Phone [phone] In person visit   Subjective:   Bridget Lane is a 87 y.o. year old very pleasant female patient who presents for/with See problem oriented charting Chief Complaint  Patient presents with   [facility] f/u for syncope   This visit occurred during the SARS-CoV-2 public health emergency.  Safety protocols were in place, including screening questions prior to the visit, additional usage of staff PPE, and extensive cleaning of exam room while observing appropriate contact time as indicated for disinfecting solutions.   Past Medical History-  Patient Active Problem List   Diagnosis Date Noted   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, estrogen receptor positive ([place]) 06/03/2018    Priority: High   Atrial fibrillation 06/14/2011    Priority: High   Insomnia 08/25/2015    Priority: Medium    Spinal stenosis in cervical region 05/29/2015    Priority: Medium    Hyperglycemia 06/07/2014    Priority: Medium    Depression 01/30/2010    Priority: Medium    Hyperlipidemia 01/28/2008    Priority: Medium    Cervical lymphadenopathy 05/16/2016    Priority: Low   History of Helicobacter pylori infection 07/06/2014    Priority: Low   OSA (obstructive sleep apnea) 02/11/2012    Priority: Low   Long term (current) use of anticoagulants 07/19/2011    Priority: Low   Chest pain 06/14/2011    Priority: Low   History of colonic polyps 01/30/2010    Priority: Low   ESOPHAGEAL STRICTURE 07/20/2008    Priority: Low   Osteoarthritis 07/20/2008    Priority: Low   Multiple thyroid nodules 01/28/2008    Priority: Low   GERD 01/28/2008    Priority: Low   Syncope and collapse 08/15/2021   Syncope, vasovagal 08/15/2021   A-fib ([place]) 08/15/2021   Hypotension 08/15/2021   AKI (acute kidney injury) ([place]) 08/15/2021   Fatigue 08/15/2021   Hallucination 08/15/2021   Aortic atherosclerosis () 05/15/2021   Lumbar spinal stenosis  07/07/2020   Gait abnormality 07/07/2020   Acquired thrombophilia ([place]) 09/01/2019   Polyarthralgia 07/15/2019   Osteoporosis 10/30/2018   Genetic testing 07/02/2018   Dyspnea on exertion 03/23/2018   Constipation 08/29/2017    Medications- reviewed and updated Current Outpatient Medications  Medication Sig Dispense Refill   apixaban (ELIQUIS) 5 MG TABS tablet TAKE 1 TABLET BY MOUTH TWICE DAILY 60 tablet 5   clobetasol (TEMOVATE) 0.05 % external solution Apply 1 application topically 3 (three) times a week.     DULoxetine (CYMBALTA) 30 MG capsule TAKE 2 CAPSULES BY MOUTH EVERY DAY 60 capsule 3   flecainide (TAMBOCOR) 100 MG tablet Take 1 tablet (100 mg total) by mouth 2 (two) times daily. 180 tablet 3   letrozole (FEMARA) 2.5 MG tablet TAKE 1 TABLET(2.5 MG) BY MOUTH DAILY 90 tablet 3   metoprolol tartrate (LOPRESSOR) 25 MG tablet TAKE 1 TABLET(25 MG) BY MOUTH TWICE DAILY 60 tablet 5   Multiple Vitamins-Minerals (MULTI FOR HER 50+) TABS Take 1 tablet by mouth daily.     Multiple Vitamins-Minerals (PRESERVISION AREDS 2+MULTI VIT PO) Take 1 tablet by mouth in the morning and at bedtime.     No current facility-administered medications for this visit.     Objective:  BP 110/64   Pulse 68   Temp (!) 97.4 F (36.3 C)   Ht 5\' 4"  (1.626 m)   Wt 153 lb 9.6 oz (69.7 kg)   SpO2  98%   BMI 26.37 kg/m  Gen: NAD, resting comfortably CV: RRR no murmurs rubs or gallops Lungs: CTAB no crackles, wheeze, rhonchi  Ext: no edema Skin: warm, dry Neuro: CN II-XII intact, sensation and reflexes normal throughout, 5/5 muscle strength in bilateral upper and lower extremities. Normal rapid alternating movements. No pronator drift. Normal romberg. Normal gait.  Psych: emotional when discussing concern for delusions while hospitalized    Assessment and Plan   # Hospitalization F/U for syncope  S:Patient was admitted from ED to hospital on 08/15/21 thorugh 08/17/21 after syncopal episode in the [facility].  Could be due to dehydration. Pt denied any prodromal symptoms before passing out. Seemed to be confused after episode. She felt much improved and was discharged on 08/17/2021 to go home.  -CT head showed acute minimally depressed bilateral nasal bone fractures with overlying soft tissue swelling.  No intracranial abnormality.  she states she has seen ENT outpatient and was told no surgery.   Echocardiogram normal LVEF, no valvular problems.  Venous duplex negative for any DVT- if positive sounds like possible CTA planned. 1-39% bilateral carotid arteries.  Troponin trend was not concerning.  No arrhythmias noted during hospitalization  -Family did reported pt had visual and auditory hallucinations however pt denied. Patient was seen by psychiatry and apparently these issues cleared- primary plan was for neurology follow up for possible dementia. Patient states that she is "close to the spiritual realm" and she states thoughts were exaggerated.  A/P: Syncope of unclear etiology.  Reassuring echocardiogram, no DVT so doubt pulmonary embolism, carotid stenosis minimal.  Some concern she may have been dehydrated as she was coming out of the [facility].  With that being said I still think she needs to see neurology (EEG?)  And cardiology (ischemia eval?  Arrhythmia eval with heart monitor?)  Before being cleared to drive-advised no driving with unexplained syncope for 6 months unless cleared by each - Since there was some concern for dehydration recommended trial off of Lasix (no history of CHF but has been primarily used for leg swelling).  We discussed if significant swelling despite trials of elevation to let me know or restart Lasix  -continue to work with PT outpatient. I advised if she cannot have someone drive her could do home health PT instead-she declines for now -recommedned against benadryl seen on medication list.  Advised against tizanidine-want to do anything to reduce fall risk -Any subsequent  syncopal episodes may need to consider stopping Coumadin due to risk of bleeding with falls  -Has already had ENT follow-up and no surgery was recommended for fracture thankfully.   # Atrial fibrillation S: Remained on flecainide as well as metoprolol 25 mg BID- slightly bradycardic even with this Anticoagulated with eliquis 5 mg BID A/P: Appropriately anticoagulated and rate controlled.  Continue current medication for now-see above consideration for stopping Coumadin if recurrent syncopal episodes or falls   #Essential tremor-has noted improvement with metoprolol 25 mg twice daily-continue current medication unless there is later evidence of bradycardia contributing to syncope     #Gout S: 0 flares on allopurinol 100mg  in the past but appears she is stopped medication-still no clear recent flares Lab Results  Component Value Date   LABURIC 7.2 (H) 01/29/2021  A/P: We will monitor-uric acid was above goal-if has recurrent flares may need to restart allopurinol  #Hyperlipidemia #Aortic atherosclerosis-prior nonobstructive CAD noted S: Medication: none Lab Results  Component Value Date   CHOL 195 01/29/2021   HDL 56.20 01/29/2021  LDLCALC 101 (H) 01/29/2021   TRIG 190.0 (H) 01/29/2021   CHOLHDL 3 01/29/2021   A/P: Reviewed chart and there was some note of 30 to 40% stenosis in LAD which was nonobstructive as well as aortic atherosclerosis-LDL goal would be 70 or less-she is agreeable to trial rosuvastatin 5 mg-may increase prediabetes risk but believe benefits outweigh risks  # Depression S: Medication: Duloxetine 60 mg (two 30mg  capsule daily) Depression screen [facility] 2/9 09/03/2021 04/30/2021 01/29/2021  Decreased Interest 0 0 0  Down, Depressed, Hopeless 0 0 0  PHQ - 2 Score 0 0 0  Altered sleeping 0 0 0  Tired, decreased energy 0 0 0  Change in appetite 0 0 0  Feeling bad or failure about yourself  0 0 0  Trouble concentrating 0 0 0  Moving slowly or fidgety/restless 0 0 0   Suicidal thoughts 0 0 0  PHQ-9 Score 0 0 0  Difficult doing work/chores Not difficult at all Not difficult at all Not difficult at all  Some recent data might be hidden  A/P: Patient denies depressed mood.  Was seen by psychiatry in the hospital with concern from family about hallucinations or delusions-they did not see evidence of delusions.  They mention possible schizotypical type features-no formal outpatient plans were made for psychiatry follow-up-we will start with neurology given some concerns for dementia plus need for syncope work-up.  Apparently they mention adding Zoloft as well patient would like to hold off.  # Hyperglycemia/insulin resistance/prediabetes S:  Medication: None Lab Results  Component Value Date   HGBA1C 5.9 01/29/2021   HGBA1C 5.8 11/01/2016   HGBA1C 5.7 05/09/2015    A/P: We will monitor A1c with labs today-some increased risk with rosuvastatin but we will proceed forward with this  Recommended follow up: No follow-ups on file. Future Appointments  Date Time Provider [place]  09/18/2021 11:00 AM [facility] CCM PHARMACIST [facility]  11/01/2021 11:00 AM [name], MD [facility]  12/26/2021 12:45 PM [name], NP [facility] None  04/26/2022 10:30 AM [name], MD [facility] None    Lab/Order associations:   ICD-10-CM   1. Syncope and collapse  R55     2. Gout, unspecified cause, unspecified chronicity, unspecified site  M10.9     3. Hyperlipidemia, unspecified hyperlipidemia type  E78.5 CBC with Differential/Platelet    Comprehensive metabolic panel    4. Atrial fibrillation, unspecified type ([place])  I48.91     5. Leg swelling  M79.89     6. Hyperglycemia  R73.9 Hemoglobin A1c    7. Aortic atherosclerosis (H[facility]) Chronic I70.0       Meds ordered this encounter  Medications   rosuvastatin (CRESTOR) 5 MG tablet    Sig: Take 1 tablet (5 mg total) by mouth daily.    Dispense:  90 tablet    Refill:  3    Time Spent:; 47  minutes of total time (3:47 PM- 4:34 PM) was spent on the date of the encounter performing the following actions: chart review prior to seeing the patient, obtaining history, performing a medically necessary exam, counseling on the treatment plan, placing orders, and documenting in our EHR.   I,Jada Bradford,acting as a scribe for Garret Reddish, MD.,have documented all relevant documentation on the behalf of Garret Reddish, MD,as directed by  Garret Reddish, MD while in the presence of Garret Reddish, MD.   I, Garret Reddish, MD, have reviewed all documentation for this visit. The documentation on 09/03/21 for the  exam, diagnosis, procedures, and orders are all accurate and complete.   Return precautions advised.  Garret Reddish, MD

## 2021-08-27 NOTE — Patient Instructions (Addendum)
Health Maintenance Due  Topic Date Due   Zoster Vaccines- Shingrix (1 of 2)- Please check with your pharmacy to see if they have the shingrix vaccine. If they do- please get this immunization and update [place] by phone call or mychart with dates you receive the vaccine  Never done   TETANUS/TDAP - Please check with your local pharmacy to see if they have the TDAP shot and update [place] by phone call or mychart with dates you receive the vaccine 07/01/2021   Please schedule an appointment with Dr.[name] for a follow-up and keep your neurology appointment in march- you can see if you can get on cancellation list for sooner.   I recommend to not drive for 6 months until you go to your cardiology and neurology appointments since you had your  passing out episode. Please have someone driving you.   Please trial off Lasix at the time. Try elevating your legs to prevent swelling. Update me for any new, worsening or persistent symptoms.  - Please stay hydrated!   Please try Rosuvastatin 5 mg daily for your cholesterol sinc eyou have had plaque on heart in the past on review today  Please stop by lab before you go If you have mychart- we will send your results within 3 business days of [place] receiving them.  If you do not have mychart- we will call you about results within 5 business days of [place] receiving them.  *please also note that you will see labs on mychart as soon as they post. I will later go in and write notes on them- will say "notes from Dr. Yong Channel"  Recommended follow up: keep January visit

## 2021-08-29 ENCOUNTER — Telehealth: Payer: Self-pay | Admitting: Family Medicine

## 2021-08-29 NOTE — Progress Notes (Signed)
  Care Management  Note   08/29/2021 Name: NITASHA JEWEL MRN: [id number] DOB: [birth year]  Bridget Lane is a 87 y.o. year old female who is a primary care patient of Yong Channel, Brayton Mars, MD. The care management team was consulted for assistance with chronic disease management and care coordination needs.   [name] was given information about Care Management services today including:  CCM service includes personalized support from designated clinical staff supervised by the physician, including individualized plan of care and coordination with other care providers 24/7 contact phone numbers for assistance for urgent and routine care needs. Service will only be billed when office clinical staff spend 20 minutes or more in a month to coordinate care. Only one practitioner may furnish and bill the service in a calendar month. The patient may stop CCM services at amy time (effective at the end of the month) by phone call to the office staff. The patient will be responsible for cost sharing (co-pay) or up to 20% of the service fee (after annual deductible is met)  Patient agreed to services and verbal consent obtained.  Follow up plan:   An initial telephone outreach has been scheduled for: 09/18/21 $[email or web address]$   [employer]

## 2021-08-30 DIAGNOSIS — H43393 Other vitreous opacities, bilateral: Secondary | ICD-10-CM | POA: Diagnosis not present

## 2021-08-30 DIAGNOSIS — H353124 Nonexudative age-related macular degeneration, left eye, advanced atrophic with subfoveal involvement: Secondary | ICD-10-CM | POA: Diagnosis not present

## 2021-08-30 DIAGNOSIS — H43813 Vitreous degeneration, bilateral: Secondary | ICD-10-CM | POA: Diagnosis not present

## 2021-08-30 DIAGNOSIS — H353112 Nonexudative age-related macular degeneration, right eye, intermediate dry stage: Secondary | ICD-10-CM | POA: Diagnosis not present

## 2021-09-03 ENCOUNTER — Ambulatory Visit (INDEPENDENT_AMBULATORY_CARE_PROVIDER_SITE_OTHER): Payer: Medicare Other | Admitting: Family Medicine

## 2021-09-03 ENCOUNTER — Encounter: Payer: Self-pay | Admitting: Family Medicine

## 2021-09-03 ENCOUNTER — Other Ambulatory Visit: Payer: Self-pay

## 2021-09-03 VITALS — BP 110/64 | HR 68 | Temp 97.4°F | Ht 64.0 in | Wt 153.6 lb

## 2021-09-03 DIAGNOSIS — R55 Syncope and collapse: Secondary | ICD-10-CM | POA: Diagnosis not present

## 2021-09-03 DIAGNOSIS — I700 Atherosclerosis of aorta: Secondary | ICD-10-CM

## 2021-09-03 DIAGNOSIS — R739 Hyperglycemia, unspecified: Secondary | ICD-10-CM | POA: Diagnosis not present

## 2021-09-03 DIAGNOSIS — E785 Hyperlipidemia, unspecified: Secondary | ICD-10-CM

## 2021-09-03 DIAGNOSIS — I4891 Unspecified atrial fibrillation: Secondary | ICD-10-CM | POA: Diagnosis not present

## 2021-09-03 DIAGNOSIS — M7989 Other specified soft tissue disorders: Secondary | ICD-10-CM | POA: Diagnosis not present

## 2021-09-03 DIAGNOSIS — M109 Gout, unspecified: Secondary | ICD-10-CM

## 2021-09-03 MED ORDER — ROSUVASTATIN CALCIUM 5 MG PO TABS: 5.0000 mg | ORAL_TABLET | Freq: Every day | ORAL | 3 refills | Status: DC

## 2021-09-04 LAB — COMPREHENSIVE METABOLIC PANEL
ALT: 14 U/L (ref 0–35)
AST: 24 U/L (ref 0–37)
Albumin: 4.5 g/dL (ref 3.5–5.2)
Alkaline Phosphatase: 87 U/L (ref 39–117)
BUN: 33 mg/dL — ABNORMAL HIGH (ref 6–23)
CO2: 26 mEq/L (ref 19–32)
Calcium: 10.1 mg/dL (ref 8.4–10.5)
Chloride: 103 mEq/L (ref 96–112)
Creatinine, Ser: 1.43 mg/dL — ABNORMAL HIGH (ref 0.40–1.20)
GFR: 33.25 mL/min — ABNORMAL LOW (ref >60.00)
Glucose, Bld: 82 mg/dL (ref 70–99)
Potassium: 4.2 mEq/L (ref 3.5–5.1)
Sodium: 138 mEq/L (ref 135–145)
Total Bilirubin: 0.6 mg/dL (ref 0.2–1.2)
Total Protein: 7.4 g/dL (ref 6.0–8.3)

## 2021-09-04 LAB — CBC WITH DIFFERENTIAL/PLATELET
Basophils Absolute: 0.1 10*3/uL (ref 0.0–0.1)
Basophils Relative: 1.2 % (ref 0.0–3.0)
Eosinophils Absolute: 0.2 10*3/uL (ref 0.0–0.7)
Eosinophils Relative: 2.1 % (ref 0.0–5.0)
HCT: 36.9 % (ref 36.0–46.0)
Hemoglobin: 12.2 g/dL (ref 12.0–15.0)
Lymphocytes Relative: 25.4 % (ref 12.0–46.0)
Lymphs Abs: 1.9 10*3/uL (ref 0.7–4.0)
MCHC: 32.9 g/dL (ref 30.0–36.0)
MCV: 87.1 fl (ref 78.0–100.0)
Monocytes Absolute: 0.5 10*3/uL (ref 0.1–1.0)
Monocytes Relative: 6.6 % (ref 3.0–12.0)
Neutro Abs: 4.7 10*3/uL (ref 1.4–7.7)
Neutrophils Relative %: 64.7 % (ref 43.0–77.0)
Platelets: 371 10*3/uL (ref 150.0–400.0)
RBC: 4.24 Mil/uL (ref 3.87–5.11)
RDW: 13.9 % (ref 11.5–15.5)
WBC: 7.3 10*3/uL (ref 4.0–10.5)

## 2021-09-04 LAB — HEMOGLOBIN A1C: Hgb A1c MFr Bld: 5.6 % (ref 4.6–6.5)

## 2021-09-07 NOTE — Progress Notes (Deleted)
Chronic Care Management Pharmacy Note  09/07/2021 Name:  Bridget Lane MRN:  [id number] DOB:  [birth year]  Summary: ***  Recommendations/Changes made from today's visit: ***  Plan: ***   Subjective: Bridget Lane is an 87 y.o. year old female who is a primary patient of Hunter, Brayton Mars, MD.  The CCM team was consulted for assistance with disease management and care coordination needs.    {CCMTELEPHONEFACETOFACE:21091510} for initial visit in response to provider referral for pharmacy case management and/or care coordination services.   Consent to Services:  The patient was given the following information about Chronic Care Management services today, agreed to services, and gave verbal consent: 1. CCM service includes personalized support from designated clinical staff supervised by the primary care provider, including individualized plan of care and coordination with other care providers 2. 24/7 contact phone numbers for assistance for urgent and routine care needs. 3. Service will only be billed when office clinical staff spend 20 minutes or more in a month to coordinate care. 4. Only one practitioner may furnish and bill the service in a calendar month. 5.The patient may stop CCM services at any time (effective at the end of the month) by phone call to the office staff. 6. The patient will be responsible for cost sharing (co-pay) of up to 20% of the service fee (after annual deductible is met). Patient agreed to services and consent obtained.  Patient Care Team: [name], MD as PCP - General (Family Medicine) [name], [name], MD as PCP - Cardiology (Cardiology) Erroll Luna, MD as Consulting Physician (General Surgery) [name], MD as Consulting Physician (Hematology and Oncology) Kyung Rudd, MD as Consulting Physician (Radiation Oncology) Edythe Clarity, [facility] as Pharmacist (Pharmacist) [name], MD (Family Medicine)  Recent office  visits: ***  Recent consult visits: [facility] visits: {Hospital DC Yes/No:25215}   Objective:  Lab Results  Component Value Date   CREATININE 1.43 (H) 09/03/2021   BUN 33 (H) 09/03/2021   GFR 33.25 (L) 09/03/2021   GFRNONAA 37 (L) 08/16/2021   GFRAA 55 (L) 10/28/2018   NA 138 09/03/2021   K 4.2 09/03/2021   CALCIUM 10.1 09/03/2021   CO2 26 09/03/2021   GLUCOSE 82 09/03/2021    Lab Results  Component Value Date/Time   HGBA1C 5.6 09/03/2021 04:51 PM   HGBA1C 5.9 01/29/2021 03:29 PM   GFR 33.25 (L) 09/03/2021 04:51 PM   GFR 37.09 (L) 01/29/2021 03:29 PM    Last diabetic Eye exam: No results found for: HMDIABEYEEXA  Last diabetic Foot exam: No results found for: HMDIABFOOTEX   Lab Results  Component Value Date   CHOL 195 01/29/2021   HDL 56.20 01/29/2021   LDLCALC 101 (H) 01/29/2021   TRIG 190.0 (H) 01/29/2021   CHOLHDL 3 01/29/2021    Hepatic Function Latest Ref Rng & Units 09/03/2021 08/16/2021 08/15/2021  Total Protein 6.0 - 8.3 g/dL 7.4 5.3(L) 6.3(L)  Albumin 3.5 - 5.2 g/dL 4.5 3.0(L) 3.5  AST 0 - 37 U/L $Remo'24 20 23  'NBmQC$ ALT 0 - 35 U/L $Remo'14 13 16  'kuOlE$ Alk Phosphatase 39 - 117 U/L 87 63 76  Total Bilirubin 0.2 - 1.2 mg/dL 0.6 0.7 0.9  Bilirubin, Direct 0.0 - 0.3 mg/dL - - -    Lab Results  Component Value Date/Time   TSH 0.571 08/16/2021 03:07 AM   TSH 0.92 09/21/2019 11:50 AM   TSH 0.57 01/27/2017 02:54 PM   FREET4 0.91 06/03/2013 11:48 AM  FREET4 1.00 05/01/2011 05:48 PM    CBC Latest Ref Rng & Units 09/03/2021 08/16/2021 08/15/2021  WBC 4.0 - 10.5 K/uL 7.3 7.6 -  Hemoglobin 12.0 - 15.0 g/dL 12.2 10.2(L) 11.9(L)  Hematocrit 36.0 - 46.0 % 36.9 31.4(L) 35.0(L)  Platelets 150.0 - 400.0 K/uL 371.0 266 -    Lab Results  Component Value Date/Time   VD25OH 34.92 06/18/2019 01:49 PM   VD25OH 57 11/22/2009 08:40 PM   VD25OH 28 (L) 05/25/2009 10:39 PM    Clinical ASCVD: {YES/NO:21197} The ASCVD Risk score (Arnett DK, et al., 2019) failed to calculate for  the following reasons:   The 2019 ASCVD risk score is only valid for ages 15 to 61    Depression screen PHQ 2/9 09/03/2021 04/30/2021 01/29/2021  Decreased Interest 0 0 0  Down, Depressed, Hopeless 0 0 0  PHQ - 2 Score 0 0 0  Altered sleeping 0 0 0  Tired, decreased energy 0 0 0  Change in appetite 0 0 0  Feeling bad or failure about yourself  0 0 0  Trouble concentrating 0 0 0  Moving slowly or fidgety/restless 0 0 0  Suicidal thoughts 0 0 0  PHQ-9 Score 0 0 0  Difficult doing work/chores Not difficult at all Not difficult at all Not difficult at all  Some recent data might be hidden     ***Other: (CHADS2VASc if Afib, MMRC or CAT for COPD, ACT, DEXA)  Social History   Tobacco Use  Smoking Status Never  Smokeless Tobacco Never   BP Readings from Last 3 Encounters:  09/03/21 110/64  08/17/21 (!) 142/44  06/04/21 122/72   Pulse Readings from Last 3 Encounters:  09/03/21 68  08/17/21 86  06/04/21 (!) 54   Wt Readings from Last 3 Encounters:  09/03/21 153 lb 9.6 oz (69.7 kg)  08/17/21 161 lb 1.6 oz (73.1 kg)  06/04/21 158 lb 3.2 oz (71.8 kg)   BMI Readings from Last 3 Encounters:  09/03/21 26.37 kg/m  08/17/21 27.65 kg/m  06/04/21 27.15 kg/m    Assessment/Interventions: Review of patient past medical history, allergies, medications, health status, including review of consultants reports, laboratory and other test data, was performed as part of comprehensive evaluation and provision of chronic care management services.   SDOH:  (Social Determinants of Health) assessments and interventions performed: {yes/no:20286}  SDOH Screenings   Alcohol Screen: Not on file  Depression (PHQ2-9): Low Risk    PHQ-2 Score: 0  Financial Resource Strain: Not on file  Food Insecurity: Not on file  Housing: Not on file  Physical Activity: Not on file  Social Connections: Not on file  Stress: Not on file  Tobacco Use: Low Risk    Smoking Tobacco Use: Never   Smokeless Tobacco  Use: Never   Passive Exposure: Not on file  Transportation Needs: Not on file    CCM Care Plan  Allergies  Allergen Reactions   Penicillins Rash   Codeine Other (See Comments)    hallucinations hallucinations   Codeine Other (See Comments)    nightmares   Dabigatran Etexilate Mesylate     Unk reaction   Acetaminophen Other (See Comments)    hurts stomach hurts stomach   Dabigatran Etexilate Mesylate Other (See Comments)     All extremities feel heavy and hurt   Dabigatran Etexilate Mesylate Other (See Comments)     All extremities feel heavy and hurt   Penicillins Rash   Sulfa Antibiotics Rash   Sulfonamide Derivatives Rash  Medications Reviewed Today     Reviewed by Angela Adam, CMA (Certified Medical Assistant) on 09/03/21 at 1451  Med List Status: <None>   Medication Order Taking? Sig Documenting Provider Last Dose Status Informant  apixaban (ELIQUIS) 5 MG TABS tablet [id number]  TAKE 1 TABLET BY MOUTH TWICE DAILY [name], [name], MD  Active   clobetasol (TEMOVATE) 0.05 % external solution [id number]  Apply 1 application topically 3 (three) times a week. [provider]  Active Multiple Informants  diphenhydrAMINE (BENADRYL) 25 MG tablet [id number]  Take 1 tablet (25 mg total) by mouth every 6 (six) hours as needed for itching or allergies. Lucrezia Starch, MD  Active   DULoxetine (CYMBALTA) 30 MG capsule [id number]  TAKE 2 CAPSULES BY MOUTH EVERY DAY [name], MD  Active   DULoxetine (CYMBALTA) 30 MG capsule [id number]  Take 60 mg by mouth 2 (two) times daily. [provider]  Active Multiple Informants  ELIQUIS 5 MG TABS tablet [id number]  Take 5 mg by mouth 2 (two) times daily. [provider]  Active Multiple Informants  flecainide (TAMBOCOR) 100 MG tablet [id number]  Take 1 tablet (100 mg total) by mouth 2 (two) times daily. [name], [name], MD  Active   flecainide (TAMBOCOR) 100 MG tablet [id number]  Take 100 mg by mouth 2 (two)  times daily. [provider]  Active Multiple Informants  furosemide (LASIX) 20 MG tablet [id number]  TAKE 1 TABLET(20 MG) BY MOUTH DAILY [name], [name], MD  Active   furosemide (LASIX) 20 MG tablet [id number]  Take 20 mg by mouth daily. [provider]  Active Multiple Informants  letrozole (FEMARA) 2.5 MG tablet [id number]  TAKE 1 TABLET(2.5 MG) BY MOUTH DAILY [name], MD  Active   letrozole [facility]) 2.5 MG tablet [id number]  Take 2.5 mg by mouth daily. [provider]  Active Multiple Informants  metoprolol tartrate (LOPRESSOR) 25 MG tablet [id number]  TAKE 1 TABLET(25 MG) BY MOUTH TWICE DAILY [name], MD  Active   metoprolol tartrate (LOPRESSOR) 25 MG tablet [id number]  Take 25 mg by mouth 2 (two) times daily. [provider]  Active Multiple Informants  Multiple Vitamins-Minerals (MULTI FOR HER 50+) TABS [id number]  Take 1 tablet by mouth daily. [provider]  Active Multiple Informants  Multiple Vitamins-Minerals (PRESERVISION AREDS 2+MULTI VIT PO) [id number]  Take by mouth daily. [provider]  Active   Multiple Vitamins-Minerals (PRESERVISION AREDS 2+MULTI VIT PO) [id number]  Take 1 tablet by mouth in the morning and at bedtime. [provider]  Active Multiple Informants  omeprazole (PRILOSEC) 40 MG capsule [id number]  TAKE 1 CAPSULE(40 MG) BY MOUTH TWICE DAILY [name], MD  Active   omeprazole (PRILOSEC) 40 MG capsule [id number]  Take 40 mg by mouth 2 (two) times daily. [provider]  Active Multiple Informants  tiZANidine (ZANAFLEX) 4 MG tablet [id number]  Take 4 mg by mouth daily as needed. [provider]  Active   traMADol (ULTRAM) 50 MG tablet [id number]    Patient not taking: Reported on 05/21/2021   [provider]  Active             Patient Active Problem List   Diagnosis Date Noted   Syncope and collapse 08/15/2021   Syncope, vasovagal 08/15/2021   A-fib ([place])  08/15/2021   Hypotension 08/15/2021   AKI (acute kidney injury) ([place]) 08/15/2021   Fatigue 08/15/2021   Hallucination 08/15/2021   Aortic atherosclerosis ([place])  05/15/2021   Lumbar spinal stenosis 07/07/2020   Gait abnormality 07/07/2020   Acquired thrombophilia ([place]) 09/01/2019   Polyarthralgia 07/15/2019   Osteoporosis 10/30/2018   Genetic testing 07/02/2018   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, estrogen receptor positive ([place]) 06/03/2018   Dyspnea on exertion 03/23/2018   Constipation 08/29/2017   Cervical lymphadenopathy 05/16/2016   Insomnia 08/25/2015   Spinal stenosis in cervical region 91/47/8295   History of Helicobacter pylori infection 07/06/2014   Hyperglycemia 06/07/2014   OSA (obstructive sleep apnea) 02/11/2012   Long term (current) use of anticoagulants 07/19/2011   Atrial fibrillation 06/14/2011   Chest pain 06/14/2011   Depression 01/30/2010   History of colonic polyps 01/30/2010   ESOPHAGEAL STRICTURE 07/20/2008   Osteoarthritis 07/20/2008   Multiple thyroid nodules 01/28/2008   Hyperlipidemia 01/28/2008   GERD 01/28/2008    Immunization History  Administered Date(s) Administered   Fluad Quad(high Dose 65+) 06/18/2019, 08/16/2021   Influenza Whole 08/21/2012   Influenza, High Dose Seasonal PF 08/21/2013   Influenza-Unspecified 08/04/2014, 07/22/2015, 07/05/2016, 07/08/2018, 07/10/2020   PFIZER(Purple Top)SARS-COV-2 Vaccination 11/29/2019, 12/24/2019   Pneumococcal Conjugate-13 09/30/2014   Pneumococcal Polysaccharide-23 11/05/2016   Tdap 07/02/2011   Zoster, Live 09/08/2013    Conditions to be addressed/monitored:  Afib, HTN, GERD, Osteoporosis, Hypothryoidism, HLD, Depression, Insomnia  There are no care plans that you recently modified to display for this patient.    Medication Assistance: {MEDASSISTANCEINFO:25044}  Compliance/Adherence/Medication fill history: Care Gaps: ***  Star-Rating  Drugs: ***  Patient's preferred pharmacy is:  [place], [place] - [address] [place] [address] Phone: [phone] Fax: [phone]  Uses pill box? {Yes or If no, why not?:20788} Pt endorses ***% compliance  We discussed: {Pharmacy options:24294} Patient decided to: {US Pharmacy Plan:23885}  Care Plan and Follow Up Patient Decision:  {FOLLOWUP:24991}  Plan: {CM FOLLOW UP VOZD:66440}  ***  Current Barriers:  {pharmacybarriers:24917}  Pharmacist Clinical Goal(s):  Patient will {PHARMACYGOALCHOICES:24921} through collaboration with PharmD and provider.   Interventions: 1:1 collaboration with [name], MD regarding development and update of comprehensive plan of care as evidenced by provider attestation and co-signature Inter-disciplinary care team collaboration (see longitudinal plan of care) Comprehensive medication review performed; medication list updated in electronic medical record  Hypertension (BP goal {CHL HP UPSTREAM Pharmacist BP [phone]}) -{US controlled/uncontrolled:25276} -Current treatment: *** -Medications previously tried: ***  -Current home readings: *** -Current dietary habits: *** -Current exercise habits: *** -{ACTIONS;DENIES/REPORTS:21021675::"Denies"} hypotensive/hypertensive symptoms -Educated on {CCM BP Counseling:25124} -Counseled to monitor BP at home ***, document, and provide log at future appointments -{CCMPHARMDINTERVENTION:25122}  Hyperlipidemia: (LDL goal < ***) -{US controlled/uncontrolled:25276} -Current treatment: *** -Medications previously tried: ***  -Current dietary patterns: *** -Current exercise habits: *** -Educated on {CCM HLD Counseling:25126} -{CCMPHARMDINTERVENTION:25122}  Atrial Fibrillation (Goal: prevent stroke and major bleeding) -{US controlled/uncontrolled:25276} -CHADSVASC: *** -Current  treatment: Rate control: Metoprolol tartrate 25mg  bide Anticoagulation: Eliquis 5mg  BID -Medications previously tried: *** -Home BP and HR readings: ***  -Counseled on {CCMAFIBCOUNSELING:25120} -{CCMPHARMDINTERVENTION:25122}  Depression/Insomnia (Goal: ***) -{US controlled/uncontrolled:25276} -Current treatment: *** -Medications previously tried/failed: *** -PHQ9: *** -GAD7: *** -Connected with *** for mental health support -Educated on {CCM mental health counseling:25127} -{CCMPHARMDINTERVENTION:25122}  Osteoporosis / Osteopenia (Goal ***) -{US controlled/uncontrolled:25276} -Last DEXA Scan: ***   T-Score femoral neck: ***  T-Score total hip: ***  T-Score lumbar spine: ***  T-Score forearm radius: ***  10-year probability  of major osteoporotic fracture: ***  10-year probability of hip fracture: *** -Patient {is;is not an osteoporosis candidate:23886} -Current treatment  *** -Medications previously tried: ***  -{Osteoporosis Counseling:23892} -{CCMPHARMDINTERVENTION:25122}  GERD (Goal: ***) -{US controlled/uncontrolled:25276} -Current treatment  *** -Medications previously tried: ***  -{CCMPHARMDINTERVENTION:25122}  Hypothyroidism (Goal: ***) -{US controlled/uncontrolled:25276} -Current treatment  *** -Medications previously tried: ***  -{CCMPHARMDINTERVENTION:25122}  Patient Goals/Self-Care Activities Patient will:  - {pharmacypatientgoals:24919}  Follow Up Plan: {CM FOLLOW UP MCRF:54360}

## 2021-09-12 ENCOUNTER — Telehealth: Payer: Self-pay | Admitting: Pharmacist

## 2021-09-12 NOTE — Chronic Care Management (AMB) (Signed)
Chronic Care Management Pharmacy Assistant   Name: Bridget Lane  MRN: [id number] DOB: March 28, 1933   Reason for Encounter: Chart Review For Initial Visit With Clinical Pharmacist   Conditions to be addressed/monitored: Atrial Fibrillation, OSA, Hypotension, OA, Osteoporosis, HLD, Hyperglycemia, Insomnia, Malignant neoplasm of lower outer quandrant of left breast, GERD  Primary concerns for visit include:   Recent office visits:  09/03/2021 OV (PCP) [name], MD; - Since there was some concern for dehydration recommended trial off of Lasix (no history of CHF but has been primarily used for leg swelling).  We discussed if significant swelling despite trials of elevation to let me know or restart Lasix.  04/30/2021 OV (PCP) [name], MD; no medication changes indicated.  Recent consult visits:  08/21/2021 OV (otolaryngology) [name], MD; no medication changes indicated.  06/04/2021 OV (sports medicine) [name], MD; no medication changes indicated.  05/21/2021 OV (cardiology) [name], [name], MD; no medication changes indicated.  04/27/2021 OV (oncology) [name], MD; no medication changes indicated.  04/12/2021 OV (neurology) [name], NP; no medication changes indicated.  Hospital visits:  08/15/2021 ED to Hospital Admission due to Syncope and Trauma Admit date: 08/15/2021   Discharge date: 08/17/2021   Admitted From: Home. Disposition:  Home.   Recommendations for Outpatient Follow-up:  Follow up with PCP in 1-2 weeks. Please obtain BMP/CBC in one week. Advised to follow-up with ENT for nasal bone fractures. Advised to follow-up with Neurologist as scheduled  Medications: Outpatient Encounter Medications as of 09/12/2021  Medication Sig   apixaban (ELIQUIS) 5 MG TABS tablet TAKE 1 TABLET BY MOUTH TWICE DAILY   clobetasol (TEMOVATE) 0.05 % external solution Apply 1 application topically 3 (three) times a week.    DULoxetine (CYMBALTA) 30 MG capsule TAKE 2 CAPSULES BY MOUTH EVERY DAY   flecainide (TAMBOCOR) 100 MG tablet Take 1 tablet (100 mg total) by mouth 2 (two) times daily.   letrozole (FEMARA) 2.5 MG tablet TAKE 1 TABLET(2.5 MG) BY MOUTH DAILY   metoprolol tartrate (LOPRESSOR) 25 MG tablet TAKE 1 TABLET(25 MG) BY MOUTH TWICE DAILY   Multiple Vitamins-Minerals (MULTI FOR HER 50+) TABS Take 1 tablet by mouth daily.   Multiple Vitamins-Minerals (PRESERVISION AREDS 2+MULTI VIT PO) Take 1 tablet by mouth in the morning and at bedtime.   rosuvastatin (CRESTOR) 5 MG tablet Take 1 tablet (5 mg total) by mouth daily.   No facility-administered encounter medications on file as of 09/12/2021.   Current Medications: Rosuvastatin 5 mg last filled 09/03/2021 90 DS Multi For Her 50+ Preservision AREDS Clobetasol 0.05% solution last filled 07/17/2021 15 DS Flecainide 100 mg last filled 06/28/2021 90 DS Duloxetine 30 mg last filled 07/16/2021 30 DS - no longer taking per patient Metoprolol Tartrate 30 mg last filled 08/22/2021 90 DS Letrozole 2.5 mg last filled 06/21/2021 90 DS - no longer taking per patient Apixaban 5 mg last filled 08/22/2021 30 DS  Patient Questions: Any changes in your medications or health? Patient states she recently stopped taking Cymbalta and Letrozole.  Any side effects from any medications?  Patient states she does not currently have any side effects from any of her medications that she is aware of.  Do you have any symptoms or problems not managed by your medications?  **Patient did not want to answer any further questions. She requested to have this appointment moved to a different day and time.   Care Gaps: Medicare Annual Wellness: Due now Hemoglobin  A1C: 5.6% on 09/03/2021 Colonoscopy: Aged Out Dexa Scan: Completed Mammogram: Completed 06/28/2021  Future Appointments  Date Time Provider [place]  09/18/2021 11:00 AM [facility] CCM PHARMACIST [facility]   11/01/2021 11:00 AM [name], MD [facility]  12/26/2021 12:45 PM [name], NP [facility] None  04/26/2022 10:30 AM [name], MD [facility] None    Star Rating Drugs: Rosuvastatin 5 mg last filled 09/03/2021 90 DS  [name], [place] Pharmacist Assistant [phone]

## 2021-09-18 ENCOUNTER — Telehealth: Payer: Medicare Other

## 2021-10-01 ENCOUNTER — Emergency Department (HOSPITAL_COMMUNITY): Payer: Medicare Other

## 2021-10-01 ENCOUNTER — Encounter (HOSPITAL_COMMUNITY): Payer: Self-pay

## 2021-10-01 ENCOUNTER — Other Ambulatory Visit: Payer: Self-pay

## 2021-10-01 ENCOUNTER — Inpatient Hospital Stay (HOSPITAL_COMMUNITY)
Admission: EM | Admit: 2021-10-01 | Discharge: 2021-10-10 | DRG: 083 | Disposition: A | Discharge status: Home Health | Payer: Medicare Other | Attending: General Surgery | Admitting: General Surgery

## 2021-10-01 DIAGNOSIS — S065X9A Traumatic subdural hemorrhage with loss of consciousness of unspecified duration, initial encounter: Principal | ICD-10-CM | POA: Diagnosis present

## 2021-10-01 DIAGNOSIS — I6200 Nontraumatic subdural hemorrhage, unspecified: Secondary | ICD-10-CM | POA: Diagnosis not present

## 2021-10-01 DIAGNOSIS — H3530 Unspecified macular degeneration: Secondary | ICD-10-CM | POA: Diagnosis not present

## 2021-10-01 DIAGNOSIS — S065XAA Traumatic subdural hemorrhage with loss of consciousness status unknown, initial encounter: Secondary | ICD-10-CM

## 2021-10-01 DIAGNOSIS — Z833 Family history of diabetes mellitus: Secondary | ICD-10-CM

## 2021-10-01 DIAGNOSIS — R21 Rash and other nonspecific skin eruption: Secondary | ICD-10-CM | POA: Diagnosis not present

## 2021-10-01 DIAGNOSIS — R451 Restlessness and agitation: Secondary | ICD-10-CM | POA: Diagnosis not present

## 2021-10-01 DIAGNOSIS — Z23 Encounter for immunization: Secondary | ICD-10-CM | POA: Diagnosis not present

## 2021-10-01 DIAGNOSIS — Z803 Family history of malignant neoplasm of breast: Secondary | ICD-10-CM

## 2021-10-01 DIAGNOSIS — S51811A Laceration without foreign body of right forearm, initial encounter: Secondary | ICD-10-CM | POA: Diagnosis not present

## 2021-10-01 DIAGNOSIS — R296 Repeated falls: Secondary | ICD-10-CM | POA: Diagnosis not present

## 2021-10-01 DIAGNOSIS — W19XXXA Unspecified fall, initial encounter: Secondary | ICD-10-CM | POA: Diagnosis not present

## 2021-10-01 DIAGNOSIS — S299XXA Unspecified injury of thorax, initial encounter: Secondary | ICD-10-CM | POA: Diagnosis not present

## 2021-10-01 DIAGNOSIS — N179 Acute kidney failure, unspecified: Secondary | ICD-10-CM | POA: Diagnosis present

## 2021-10-01 DIAGNOSIS — M19042 Primary osteoarthritis, left hand: Secondary | ICD-10-CM | POA: Diagnosis present

## 2021-10-01 DIAGNOSIS — E785 Hyperlipidemia, unspecified: Secondary | ICD-10-CM | POA: Diagnosis present

## 2021-10-01 DIAGNOSIS — Y92230 Patient room in hospital as the place of occurrence of the external cause: Secondary | ICD-10-CM | POA: Diagnosis not present

## 2021-10-01 DIAGNOSIS — Z8042 Family history of malignant neoplasm of prostate: Secondary | ICD-10-CM

## 2021-10-01 DIAGNOSIS — S01501A Unspecified open wound of lip, initial encounter: Secondary | ICD-10-CM | POA: Diagnosis not present

## 2021-10-01 DIAGNOSIS — F0394 Unspecified dementia, unspecified severity, with anxiety: Secondary | ICD-10-CM | POA: Diagnosis not present

## 2021-10-01 DIAGNOSIS — S022XXA Fracture of nasal bones, initial encounter for closed fracture: Secondary | ICD-10-CM | POA: Diagnosis not present

## 2021-10-01 DIAGNOSIS — I480 Paroxysmal atrial fibrillation: Secondary | ICD-10-CM | POA: Diagnosis present

## 2021-10-01 DIAGNOSIS — S065X0A Traumatic subdural hemorrhage without loss of consciousness, initial encounter: Secondary | ICD-10-CM | POA: Diagnosis not present

## 2021-10-01 DIAGNOSIS — R402252 Coma scale, best verbal response, oriented, at arrival to emergency department: Secondary | ICD-10-CM | POA: Diagnosis present

## 2021-10-01 DIAGNOSIS — R609 Edema, unspecified: Secondary | ICD-10-CM | POA: Diagnosis not present

## 2021-10-01 DIAGNOSIS — S01511A Laceration without foreign body of lip, initial encounter: Secondary | ICD-10-CM | POA: Diagnosis not present

## 2021-10-01 DIAGNOSIS — Z853 Personal history of malignant neoplasm of breast: Secondary | ICD-10-CM

## 2021-10-01 DIAGNOSIS — Z800 Family history of malignant neoplasm of digestive organs: Secondary | ICD-10-CM | POA: Diagnosis not present

## 2021-10-01 DIAGNOSIS — R402362 Coma scale, best motor response, obeys commands, at arrival to emergency department: Secondary | ICD-10-CM | POA: Diagnosis not present

## 2021-10-01 DIAGNOSIS — Z79899 Other long term (current) drug therapy: Secondary | ICD-10-CM | POA: Diagnosis not present

## 2021-10-01 DIAGNOSIS — Z9889 Other specified postprocedural states: Secondary | ICD-10-CM | POA: Diagnosis not present

## 2021-10-01 DIAGNOSIS — R402142 Coma scale, eyes open, spontaneous, at arrival to emergency department: Secondary | ICD-10-CM | POA: Diagnosis present

## 2021-10-01 DIAGNOSIS — S0012XA Contusion of left eyelid and periocular area, initial encounter: Secondary | ICD-10-CM | POA: Diagnosis not present

## 2021-10-01 DIAGNOSIS — N1831 Chronic kidney disease, stage 3a: Secondary | ICD-10-CM | POA: Diagnosis present

## 2021-10-01 DIAGNOSIS — Z888 Allergy status to other drugs, medicaments and biological substances status: Secondary | ICD-10-CM

## 2021-10-01 DIAGNOSIS — I4891 Unspecified atrial fibrillation: Secondary | ICD-10-CM | POA: Diagnosis not present

## 2021-10-01 DIAGNOSIS — Z90710 Acquired absence of both cervix and uterus: Secondary | ICD-10-CM

## 2021-10-01 DIAGNOSIS — Y92410 Unspecified street and highway as the place of occurrence of the external cause: Secondary | ICD-10-CM | POA: Diagnosis not present

## 2021-10-01 DIAGNOSIS — R002 Palpitations: Secondary | ICD-10-CM | POA: Diagnosis not present

## 2021-10-01 DIAGNOSIS — S3991XA Unspecified injury of abdomen, initial encounter: Secondary | ICD-10-CM | POA: Diagnosis not present

## 2021-10-01 DIAGNOSIS — Z5181 Encounter for therapeutic drug level monitoring: Secondary | ICD-10-CM | POA: Diagnosis not present

## 2021-10-01 DIAGNOSIS — Z20822 Contact with and (suspected) exposure to covid-19: Secondary | ICD-10-CM | POA: Diagnosis present

## 2021-10-01 DIAGNOSIS — Z885 Allergy status to narcotic agent status: Secondary | ICD-10-CM

## 2021-10-01 DIAGNOSIS — M7989 Other specified soft tissue disorders: Secondary | ICD-10-CM | POA: Diagnosis not present

## 2021-10-01 DIAGNOSIS — Z9049 Acquired absence of other specified parts of digestive tract: Secondary | ICD-10-CM

## 2021-10-01 DIAGNOSIS — S61511A Laceration without foreign body of right wrist, initial encounter: Secondary | ICD-10-CM | POA: Diagnosis present

## 2021-10-01 DIAGNOSIS — I700 Atherosclerosis of aorta: Secondary | ICD-10-CM | POA: Diagnosis not present

## 2021-10-01 DIAGNOSIS — R9431 Abnormal electrocardiogram [ECG] [EKG]: Secondary | ICD-10-CM | POA: Diagnosis not present

## 2021-10-01 DIAGNOSIS — S199XXA Unspecified injury of neck, initial encounter: Secondary | ICD-10-CM | POA: Diagnosis not present

## 2021-10-01 DIAGNOSIS — R54 Age-related physical debility: Secondary | ICD-10-CM | POA: Diagnosis present

## 2021-10-01 DIAGNOSIS — Z87828 Personal history of other (healed) physical injury and trauma: Secondary | ICD-10-CM | POA: Diagnosis not present

## 2021-10-01 DIAGNOSIS — Z981 Arthrodesis status: Secondary | ICD-10-CM

## 2021-10-01 DIAGNOSIS — M19041 Primary osteoarthritis, right hand: Secondary | ICD-10-CM | POA: Diagnosis present

## 2021-10-01 DIAGNOSIS — S3993XA Unspecified injury of pelvis, initial encounter: Secondary | ICD-10-CM | POA: Diagnosis not present

## 2021-10-01 DIAGNOSIS — Z886 Allergy status to analgesic agent status: Secondary | ICD-10-CM

## 2021-10-01 DIAGNOSIS — Z86010 Personal history of colonic polyps: Secondary | ICD-10-CM

## 2021-10-01 DIAGNOSIS — Z880 Allergy status to penicillin: Secondary | ICD-10-CM

## 2021-10-01 DIAGNOSIS — Z8249 Family history of ischemic heart disease and other diseases of the circulatory system: Secondary | ICD-10-CM

## 2021-10-01 DIAGNOSIS — I129 Hypertensive chronic kidney disease with stage 1 through stage 4 chronic kidney disease, or unspecified chronic kidney disease: Secondary | ICD-10-CM | POA: Diagnosis present

## 2021-10-01 DIAGNOSIS — Z7901 Long term (current) use of anticoagulants: Secondary | ICD-10-CM

## 2021-10-01 DIAGNOSIS — K219 Gastro-esophageal reflux disease without esophagitis: Secondary | ICD-10-CM | POA: Diagnosis present

## 2021-10-01 LAB — I-STAT CHEM 8, ED
BUN: 27 mg/dL — ABNORMAL HIGH (ref 8–23)
Calcium, Ion: 1.2 mmol/L (ref 1.15–1.40)
Chloride: 105 mmol/L (ref 98–111)
Creatinine, Ser: 1.5 mg/dL — ABNORMAL HIGH (ref 0.44–1.00)
Glucose, Bld: 106 mg/dL — ABNORMAL HIGH (ref 70–99)
HCT: 34 % — ABNORMAL LOW (ref 36.0–46.0)
Hemoglobin: 11.6 g/dL — ABNORMAL LOW (ref 12.0–15.0)
Potassium: 3.9 mmol/L (ref 3.5–5.1)
Sodium: 140 mmol/L (ref 135–145)
TCO2: 25 mmol/L (ref 22–32)

## 2021-10-01 LAB — CBC WITH DIFFERENTIAL/PLATELET
Abs Immature Granulocytes: 0.04 10*3/uL (ref 0.00–0.07)
Basophils Absolute: 0.1 10*3/uL (ref 0.0–0.1)
Basophils Relative: 1 %
Eosinophils Absolute: 0.1 10*3/uL (ref 0.0–0.5)
Eosinophils Relative: 1 %
HCT: 33.8 % — ABNORMAL LOW (ref 36.0–46.0)
Hemoglobin: 11 g/dL — ABNORMAL LOW (ref 12.0–15.0)
Immature Granulocytes: 1 %
Lymphocytes Relative: 20 %
Lymphs Abs: 1.5 10*3/uL (ref 0.7–4.0)
MCH: 29.5 pg (ref 26.0–34.0)
MCHC: 32.5 g/dL (ref 30.0–36.0)
MCV: 90.6 fL (ref 80.0–100.0)
Monocytes Absolute: 0.5 10*3/uL (ref 0.1–1.0)
Monocytes Relative: 6 %
Neutro Abs: 5.4 10*3/uL (ref 1.7–7.7)
Neutrophils Relative %: 71 %
Platelets: 300 10*3/uL (ref 150–400)
RBC: 3.73 MIL/uL — ABNORMAL LOW (ref 3.87–5.11)
RDW: 13.5 % (ref 11.5–15.5)
WBC: 7.6 10*3/uL (ref 4.0–10.5)
nRBC: 0 % (ref 0.0–0.2)

## 2021-10-01 LAB — SAMPLE TO BLOOD BANK

## 2021-10-01 LAB — BASIC METABOLIC PANEL
Anion gap: 9 (ref 5–15)
BUN: 23 mg/dL (ref 8–23)
CO2: 25 mmol/L (ref 22–32)
Calcium: 9.3 mg/dL (ref 8.9–10.3)
Chloride: 105 mmol/L (ref 98–111)
Creatinine, Ser: 1.35 mg/dL — ABNORMAL HIGH (ref 0.44–1.00)
GFR, Estimated: 38 mL/min — ABNORMAL LOW (ref >60)
Glucose, Bld: 118 mg/dL — ABNORMAL HIGH (ref 70–99)
Potassium: 3.8 mmol/L (ref 3.5–5.1)
Sodium: 139 mmol/L (ref 135–145)

## 2021-10-01 LAB — PROTIME-INR
INR: 1.4 — ABNORMAL HIGH (ref 0.8–1.2)
Prothrombin Time: 16.7 seconds — ABNORMAL HIGH (ref 11.4–15.2)

## 2021-10-01 LAB — LACTIC ACID, PLASMA: Lactic Acid, Venous: 0.8 mmol/L (ref 0.5–1.9)

## 2021-10-01 LAB — RESP PANEL BY RT-PCR (FLU A&B, COVID) ARPGX2
Influenza A by PCR: NEGATIVE
Influenza B by PCR: NEGATIVE
SARS Coronavirus 2 by RT PCR: NEGATIVE

## 2021-10-01 LAB — ETHANOL: Alcohol, Ethyl (B): <10 mg/dL (ref <10)

## 2021-10-01 MED ORDER — IOHEXOL 300 MG/ML  SOLN
80.0000 mL | Freq: Once | INTRAMUSCULAR | Status: AC | PRN
  Administered 2021-10-01: 80 mL via INTRAVENOUS

## 2021-10-01 MED ORDER — FENTANYL CITRATE PF 50 MCG/ML IJ SOSY
50.0000 ug | PREFILLED_SYRINGE | Freq: Once | INTRAMUSCULAR | Status: AC
  Administered 2021-10-01: 50 ug via INTRAVENOUS

## 2021-10-01 MED ORDER — LETROZOLE 2.5 MG PO TABS: 2.5000 mg | ORAL_TABLET | Freq: Every day | ORAL | Status: DC

## 2021-10-01 MED ORDER — CHLORHEXIDINE GLUCONATE CLOTH 2 % EX PADS
6.0000 | MEDICATED_PAD | Freq: Every day | CUTANEOUS | Status: DC
  Administered 2021-10-01 – 2021-10-07 (×6): 6 via TOPICAL

## 2021-10-01 MED ORDER — LIDOCAINE HCL (PF) 1 % IJ SOLN
INTRAMUSCULAR | Status: AC
  Filled 2021-10-01: qty 30

## 2021-10-01 MED ORDER — LIDOCAINE-EPINEPHRINE (PF) 2 %-1:200000 IJ SOLN: 10.0000 mL | Freq: Once | INTRAMUSCULAR | Status: AC

## 2021-10-01 MED ORDER — FLECAINIDE ACETATE 100 MG PO TABS: 100.0000 mg | ORAL_TABLET | Freq: Two times a day (BID) | ORAL | Status: DC

## 2021-10-01 MED ORDER — ROSUVASTATIN CALCIUM 5 MG PO TABS
5.0000 mg | ORAL_TABLET | Freq: Every day | ORAL | Status: DC
  Administered 2021-10-03 – 2021-10-10 (×8): 5 mg via ORAL
  Filled 2021-10-01 (×9): qty 1

## 2021-10-01 MED ORDER — EMPTY CONTAINERS FLEXIBLE MISC
900.0000 mg | Freq: Once | Status: AC
  Administered 2021-10-01: 900 mg via INTRAVENOUS
  Filled 2021-10-01: qty 90

## 2021-10-01 MED ORDER — HYDROMORPHONE HCL 1 MG/ML IJ SOLN
0.5000 mg | INTRAMUSCULAR | Status: DC | PRN
  Administered 2021-10-01 – 2021-10-06 (×6): 0.5 mg via INTRAVENOUS
  Filled 2021-10-01: qty 0.5
  Filled 2021-10-01: qty 1
  Filled 2021-10-01 (×4): qty 0.5

## 2021-10-01 MED ORDER — MELATONIN 3 MG PO TABS
3.0000 mg | ORAL_TABLET | Freq: Every evening | ORAL | Status: DC | PRN
  Administered 2021-10-02 – 2021-10-10 (×3): 3 mg via ORAL
  Filled 2021-10-01 (×4): qty 1

## 2021-10-01 MED ORDER — METOPROLOL TARTRATE 25 MG PO TABS
25.0000 mg | ORAL_TABLET | Freq: Two times a day (BID) | ORAL | Status: DC
  Administered 2021-10-02 – 2021-10-10 (×12): 25 mg via ORAL
  Filled 2021-10-01 (×17): qty 1

## 2021-10-01 MED ORDER — HYDRALAZINE HCL 20 MG/ML IJ SOLN: 10.0000 mg | INTRAMUSCULAR | Status: DC | PRN

## 2021-10-01 MED ORDER — TRAMADOL HCL 50 MG PO TABS
50.0000 mg | ORAL_TABLET | Freq: Four times a day (QID) | ORAL | Status: DC | PRN
  Administered 2021-10-02 – 2021-10-04 (×7): 50 mg via ORAL
  Filled 2021-10-01 (×7): qty 1

## 2021-10-01 MED ORDER — LEVETIRACETAM IN NACL 500 MG/100ML IV SOLN
500.0000 mg | Freq: Two times a day (BID) | INTRAVENOUS | Status: AC
  Administered 2021-10-02 – 2021-10-08 (×14): 500 mg via INTRAVENOUS
  Filled 2021-10-01 (×16): qty 100

## 2021-10-01 MED ORDER — ONDANSETRON 4 MG PO TBDP: 4.0000 mg | ORAL_TABLET | Freq: Four times a day (QID) | ORAL | Status: DC | PRN

## 2021-10-01 MED ORDER — LIDOCAINE-EPINEPHRINE (PF) 2 %-1:200000 IJ SOLN
INTRAMUSCULAR | Status: AC
  Administered 2021-10-01: 10 mL via INTRADERMAL
  Filled 2021-10-01: qty 20

## 2021-10-01 MED ORDER — ACETAMINOPHEN 325 MG PO TABS
650.0000 mg | ORAL_TABLET | Freq: Four times a day (QID) | ORAL | Status: DC
  Administered 2021-10-02 – 2021-10-04 (×11): 650 mg via ORAL
  Filled 2021-10-01 (×10): qty 2

## 2021-10-01 MED ORDER — LACTATED RINGERS IV SOLN: INTRAVENOUS | Status: DC

## 2021-10-01 MED ORDER — ONDANSETRON HCL 4 MG/2ML IJ SOLN
4.0000 mg | Freq: Four times a day (QID) | INTRAMUSCULAR | Status: DC | PRN
  Administered 2021-10-02 – 2021-10-08 (×2): 4 mg via INTRAVENOUS
  Filled 2021-10-01 (×4): qty 2

## 2021-10-01 MED ORDER — POLYETHYL GLYCOL-PROPYL GLYCOL 0.4-0.3 % OP SOLN: 1.0000 [drp] | Freq: Every day | OPHTHALMIC | Status: DC | PRN

## 2021-10-01 MED ORDER — FENTANYL CITRATE PF 50 MCG/ML IJ SOSY
50.0000 ug | PREFILLED_SYRINGE | Freq: Once | INTRAMUSCULAR | Status: DC
  Filled 2021-10-01: qty 1

## 2021-10-01 MED ORDER — TETANUS-DIPHTH-ACELL PERTUSSIS 5-2.5-18.5 LF-MCG/0.5 IM SUSY
0.5000 mL | PREFILLED_SYRINGE | Freq: Once | INTRAMUSCULAR | Status: AC
  Administered 2021-10-01: 0.5 mL via INTRAMUSCULAR
  Filled 2021-10-01: qty 0.5

## 2021-10-01 MED ORDER — DOCUSATE SODIUM 100 MG PO CAPS
100.0000 mg | ORAL_CAPSULE | Freq: Two times a day (BID) | ORAL | Status: DC
  Administered 2021-10-02 – 2021-10-10 (×17): 100 mg via ORAL
  Filled 2021-10-01 (×17): qty 1

## 2021-10-01 MED ORDER — DULOXETINE HCL 60 MG PO CPEP: 60.0000 mg | ORAL_CAPSULE | Freq: Every day | ORAL | Status: DC

## 2021-10-01 NOTE — ED Provider Notes (Signed)
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 Laceration of right wrist, initial encounter     Disposition: Admit  This note was prepared with assistance of Dragon voice recognition  software. Occasional wrong-word or sound-a-like substitutions may have occurred due to the inherent limitations of voice recognition software.      Reymond Maynez, Gwenyth Allegra, MD 10/01/21 [phone]

## 2021-10-01 NOTE — ED Notes (Signed)
MD at bedside for R wrist lac repair
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
## 2021-10-02 ENCOUNTER — Telehealth: Payer: Self-pay | Admitting: Pharmacist

## 2021-10-02 DIAGNOSIS — I480 Paroxysmal atrial fibrillation: Secondary | ICD-10-CM

## 2021-10-02 DIAGNOSIS — Z7901 Long term (current) use of anticoagulants: Secondary | ICD-10-CM

## 2021-10-02 DIAGNOSIS — Z5181 Encounter for therapeutic drug level monitoring: Secondary | ICD-10-CM

## 2021-10-02 LAB — BASIC METABOLIC PANEL
Anion gap: 8 (ref 5–15)
BUN: 19 mg/dL (ref 8–23)
CO2: 22 mmol/L (ref 22–32)
Calcium: 8.6 mg/dL — ABNORMAL LOW (ref 8.9–10.3)
Chloride: 104 mmol/L (ref 98–111)
Creatinine, Ser: 1.13 mg/dL — ABNORMAL HIGH (ref 0.44–1.00)
GFR, Estimated: 47 mL/min — ABNORMAL LOW (ref >60)
Glucose, Bld: 158 mg/dL — ABNORMAL HIGH (ref 70–99)
Potassium: 3.9 mmol/L (ref 3.5–5.1)
Sodium: 134 mmol/L — ABNORMAL LOW (ref 135–145)

## 2021-10-02 LAB — CBC
HCT: 28.8 % — ABNORMAL LOW (ref 36.0–46.0)
Hemoglobin: 9.4 g/dL — ABNORMAL LOW (ref 12.0–15.0)
MCH: 28.8 pg (ref 26.0–34.0)
MCHC: 32.6 g/dL (ref 30.0–36.0)
MCV: 88.3 fL (ref 80.0–100.0)
Platelets: 221 10*3/uL (ref 150–400)
RBC: 3.26 MIL/uL — ABNORMAL LOW (ref 3.87–5.11)
RDW: 13.2 % (ref 11.5–15.5)
WBC: 10.6 10*3/uL — ABNORMAL HIGH (ref 4.0–10.5)
nRBC: 0 % (ref 0.0–0.2)

## 2021-10-02 LAB — MRSA NEXT GEN BY PCR, NASAL: MRSA by PCR Next Gen: NOT DETECTED

## 2021-10-02 MED ORDER — FLECAINIDE ACETATE 100 MG PO TABS
100.0000 mg | ORAL_TABLET | Freq: Two times a day (BID) | ORAL | Status: DC
  Administered 2021-10-02 – 2021-10-10 (×17): 100 mg via ORAL
  Filled 2021-10-02 (×19): qty 1
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
## 2021-10-02 NOTE — ED Notes (Signed)
Transported to CT by TRN. C-Collar placed at this time.
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
## 2021-10-02 NOTE — TOC CAGE-AID Note (Signed)
Transition of Care [facility]) - CAGE-AID Screening   Patient Details  Name: Bridget Lane MRN: [id number] Date of Birth: 08-15-1933  Transition of Care [facility]) CM/SW Contact:    Bethann Berkshire, [place] Phone Number: 10/02/2021, 10:48 AM   Clinical Narrative:  CAGE-AID completed; score of 0. Pt states she does not drink alcohol or use any other substances.   CAGE-AID Screening:    Have You Ever Felt You Ought to Cut Down on Your Drinking or Drug Use?: No Have People Annoyed You By Critizing Your Drinking Or Drug Use?: No Have You Felt Bad Or Guilty About Your Drinking Or Drug Use?: No Have You Ever Had a Drink or Used Drugs First Thing In The Morning to Steady Your Nerves or to Get Rid of a Hangover?: No CAGE-AID Score: 0  Substance Abuse Education Offered: No
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
## 2021-10-02 NOTE — ED Notes (Signed)
Dr. Sherry Ruffing spoke with Dr. Zada Finders with NS at this time about pt's SDH. Dr. Zada Finders in the OR at this time and will come see pt.
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
## 2021-10-02 NOTE — Progress Notes (Signed)
Dr. Grandville Silos aware of soft BPs.  Ok if SBP >100.

## 2021-10-03 LAB — BASIC METABOLIC PANEL
Anion gap: 7 (ref 5–15)
BUN: 13 mg/dL (ref 8–23)
CO2: 23 mmol/L (ref 22–32)
Calcium: 8.6 mg/dL — ABNORMAL LOW (ref 8.9–10.3)
Chloride: 103 mmol/L (ref 98–111)
Creatinine, Ser: 1.18 mg/dL — ABNORMAL HIGH (ref 0.44–1.00)
GFR, Estimated: 45 mL/min — ABNORMAL LOW (ref >60)
Glucose, Bld: 103 mg/dL — ABNORMAL HIGH (ref 70–99)
Potassium: 3.9 mmol/L (ref 3.5–5.1)
Sodium: 133 mmol/L — ABNORMAL LOW (ref 135–145)

## 2021-10-03 LAB — CBC
HCT: 29 % — ABNORMAL LOW (ref 36.0–46.0)
Hemoglobin: 9.5 g/dL — ABNORMAL LOW (ref 12.0–15.0)
MCH: 29.1 pg (ref 26.0–34.0)
MCHC: 32.8 g/dL (ref 30.0–36.0)
MCV: 88.7 fL (ref 80.0–100.0)
Platelets: 235 10*3/uL (ref 150–400)
RBC: 3.27 MIL/uL — ABNORMAL LOW (ref 3.87–5.11)
RDW: 13.5 % (ref 11.5–15.5)
WBC: 10.4 10*3/uL (ref 4.0–10.5)
nRBC: 0 % (ref 0.0–0.2)
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
## 2021-10-03 NOTE — Progress Notes (Signed)
SLP Cancellation Note  Patient Details Name: Bridget Lane MRN: [id number] DOB: 1933-08-09   Cancelled treatment:       Reason Eval/Treat Not Completed: Patient unavailable x2 attempts this am to see for cognitive/communication eval. Will continue efforts.    [name] 10/03/2021, 3:34 PM

## 2021-10-04 ENCOUNTER — Encounter (HOSPITAL_COMMUNITY): Payer: Self-pay

## 2021-10-04 ENCOUNTER — Inpatient Hospital Stay (HOSPITAL_COMMUNITY): Payer: Medicare Other

## 2021-10-04 LAB — CBC
HCT: 27.8 % — ABNORMAL LOW (ref 36.0–46.0)
Hemoglobin: 9.2 g/dL — ABNORMAL LOW (ref 12.0–15.0)
MCH: 29.4 pg (ref 26.0–34.0)
MCHC: 33.1 g/dL (ref 30.0–36.0)
MCV: 88.8 fL (ref 80.0–100.0)
Platelets: 215 10*3/uL (ref 150–400)
RBC: 3.13 MIL/uL — ABNORMAL LOW (ref 3.87–5.11)
RDW: 13.5 % (ref 11.5–15.5)
WBC: 9.6 10*3/uL (ref 4.0–10.5)
nRBC: 0 % (ref 0.0–0.2)

## 2021-10-04 LAB — BASIC METABOLIC PANEL
Anion gap: 11 (ref 5–15)
BUN: 11 mg/dL (ref 8–23)
CO2: 24 mmol/L (ref 22–32)
Calcium: 8.4 mg/dL — ABNORMAL LOW (ref 8.9–10.3)
Chloride: 101 mmol/L (ref 98–111)
Creatinine, Ser: 0.99 mg/dL (ref 0.44–1.00)
GFR, Estimated: 56 mL/min — ABNORMAL LOW (ref >60)
Glucose, Bld: 102 mg/dL — ABNORMAL HIGH (ref 70–99)
Potassium: 4 mmol/L (ref 3.5–5.1)
Sodium: 136 mmol/L (ref 135–145)
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
## 2021-10-05 LAB — BASIC METABOLIC PANEL
Anion gap: 9 (ref 5–15)
BUN: 15 mg/dL (ref 8–23)
CO2: 23 mmol/L (ref 22–32)
Calcium: 8.3 mg/dL — ABNORMAL LOW (ref 8.9–10.3)
Chloride: 100 mmol/L (ref 98–111)
Creatinine, Ser: 1.23 mg/dL — ABNORMAL HIGH (ref 0.44–1.00)
GFR, Estimated: 43 mL/min — ABNORMAL LOW (ref >60)
Glucose, Bld: 90 mg/dL (ref 70–99)
Potassium: 3.9 mmol/L (ref 3.5–5.1)
Sodium: 132 mmol/L — ABNORMAL LOW (ref 135–145)

## 2021-10-05 LAB — URINALYSIS, ROUTINE W REFLEX MICROSCOPIC
Bilirubin Urine: NEGATIVE
Glucose, UA: NEGATIVE mg/dL
Hgb urine dipstick: NEGATIVE
Ketones, ur: NEGATIVE mg/dL
Leukocytes,Ua: NEGATIVE
Nitrite: NEGATIVE
Protein, ur: NEGATIVE mg/dL
Specific Gravity, Urine: 1.015 (ref 1.005–1.030)
pH: 6 (ref 5.0–8.0)

## 2021-10-05 MED ORDER — ACETAMINOPHEN 500 MG PO TABS
1000.0000 mg | ORAL_TABLET | Freq: Four times a day (QID) | ORAL | Status: DC
  Administered 2021-10-05 – 2021-10-10 (×18): 1000 mg via ORAL
  Filled 2021-10-05 (×20): qty 2

## 2021-10-05 MED ORDER — HEPARIN SODIUM (PORCINE) 5000 UNIT/ML IJ SOLN
5000.0000 [IU] | Freq: Three times a day (TID) | INTRAMUSCULAR | Status: DC
  Administered 2021-10-05 – 2021-10-10 (×16): 5000 [IU] via SUBCUTANEOUS
  Filled 2021-10-05 (×16): qty 1

## 2021-10-05 MED ORDER — TRAMADOL HCL 50 MG PO TABS
50.0000 mg | ORAL_TABLET | Freq: Four times a day (QID) | ORAL | Status: DC | PRN
  Administered 2021-10-05 – 2021-10-08 (×6): 50 mg via ORAL
  Filled 2021-10-05 (×6): qty 1
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
## 2021-10-05 NOTE — TOC Progression Note (Signed)
Transition of Care [facility]) - Progression Note    Patient Details  Name: TANICIA WOLAVER MRN: [id number] Date of Birth: [birth year]  Transition of [facility]) CM/SW Contact  Ella Bodo, RN Phone Number: 10/05/2021, 1500  Clinical Narrative:    Met with patient and family members at bedside.  PT now recommending CIR, and consult in progress.  Pt and family members prefer CIR to SNF; she will have 24h assistance provided by multiple family members. Will follow progress; hopeful for CIR pending medical stability/insurance auth/bed availability.    Expected Discharge Plan: IP Rehab Facility Barriers to Discharge: Continued Medical Work up  Expected Discharge Plan and Services Expected Discharge Plan: [place] In-house Referral: Clinical Social Work   Post Acute Care Choice: [place] Living arrangements for the past 2 months: Apartment                                       Social Determinants of Health (SDOH) Interventions    Readmission Risk Interventions No flowsheet data found.  Reinaldo Raddle, RN, BSN  Trauma/Neuro ICU Case Manager [phone]

## 2021-10-05 NOTE — Progress Notes (Signed)
Inpatient Rehab Admissions Coordinator Note:   Per updated therapy recommendations patient was screened for CIR candidacy by Michel Santee, PT. At this time, pt appears to be a potential candidate for CIR. I will place an order for rehab consult for full assessment, per our protocol.  Please contact me any with questions.Shann Medal, PT, DPT [phone] 10/05/21 10:08 AM
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
## 2021-10-05 NOTE — Plan of Care (Signed)
°  Problem: Education: Goal: Knowledge of General Education information will improve Description: Including pain rating scale, medication(s)/side effects and non-pharmacologic comfort measures Outcome: Progressing   Problem: Nutrition: Goal: Adequate nutrition will be maintained Outcome: Progressing   Problem: Coping: Goal: Level of anxiety will decrease Outcome: Progressing   Problem: Elimination: Goal: Will not experience complications related to urinary retention Outcome: Progressing   Problem: Elimination: Goal: Will not experience complications related to bowel motility Outcome: Progressing   Problem: Pain Managment: Goal: General experience of comfort will improve Outcome: Progressing   Problem: Safety: Goal: Ability to remain free from injury will improve Outcome: Progressing   Problem: Skin Integrity: Goal: Risk for impaired skin integrity will decrease Outcome: Progressing

## 2021-10-06 ENCOUNTER — Inpatient Hospital Stay (HOSPITAL_COMMUNITY): Payer: Medicare Other

## 2021-10-06 LAB — BASIC METABOLIC PANEL
Anion gap: 9 (ref 5–15)
BUN: 14 mg/dL (ref 8–23)
CO2: 25 mmol/L (ref 22–32)
Calcium: 8.5 mg/dL — ABNORMAL LOW (ref 8.9–10.3)
Chloride: 102 mmol/L (ref 98–111)
Creatinine, Ser: 1 mg/dL (ref 0.44–1.00)
GFR, Estimated: 55 mL/min — ABNORMAL LOW (ref >60)
Glucose, Bld: 97 mg/dL (ref 70–99)
Potassium: 3.5 mmol/L (ref 3.5–5.1)
Sodium: 136 mmol/L (ref 135–145)

## 2021-10-06 MED ORDER — DIPHENHYDRAMINE HCL 50 MG/ML IJ SOLN
12.5000 mg | Freq: Once | INTRAMUSCULAR | Status: AC
  Administered 2021-10-06: 12.5 mg via INTRAVENOUS
  Filled 2021-10-06: qty 1

## 2021-10-06 MED ORDER — LORAZEPAM 2 MG/ML IJ SOLN
0.5000 mg | Freq: Once | INTRAMUSCULAR | Status: AC
  Administered 2021-10-06: 0.5 mg via INTRAVENOUS
  Filled 2021-10-06: qty 1

## 2021-10-06 NOTE — Progress Notes (Signed)
Patient woke up from her nap, very agitated, cursing and screaming to staff. Patient trying to get OOB unassisted, refusing staff to help her, patient fell last night with injury. Sttaff tries to re direct patient but unsuccessful. Daughter called to talked with the patient, still patient remains agitated, screaming, cursing,hitting staff. MD paged of patient behavior.

## 2021-10-06 NOTE — Progress Notes (Signed)
Inpatient Rehab Admissions: ° °Inpatient Rehab Consult received.  I met with patient at the bedside for rehabilitation assessment and to discuss goals and expectations of an inpatient rehab admission.  Pt acknowledged understanding of CIR goals and expectations. Pt interested in pursuing CIR. Pt gave permission to contact daughter, [name]. Called [name] and left a message; awaiting return call. Will continue to follow. ° ° °Signed: ° Graves Madden, MS, CCC-SLP °Admissions Coordinator °[phone] ° ° °
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
## 2021-10-06 NOTE — Progress Notes (Signed)
10/06/21 0430  What Happened  Was fall witnessed? No  Was patient injured? Yes  Patient found on floor  Found by Staff-comment Elisabeth Cara RN)  Stated prior activity bathroom-unassisted  Follow Up  MD notified Dr Rosendo Gros  Time MD notified [phone]  Additional tests No  Simple treatment Ice  Progress note created (see row info) Yes  Adult Fall Risk Assessment  Risk Factor Category (scoring not indicated) Fall has occurred during this admission (document High fall risk)  Age 87  Fall History: Fall within 6 months prior to admission 0  Elimination; Bowel and/or Urine Incontinence 0  Elimination; Bowel and/or Urine Urgency/Frequency 0  Medications: includes PCA/Opiates, Anti-convulsants, Anti-hypertensives, Diuretics, Hypnotics, Laxatives, Sedatives, and Psychotropics 3  Patient Care Equipment 1  Mobility-Assistance 2  Mobility-Gait 2  Mobility-Sensory Deficit 0  Altered awareness of immediate physical environment 1  Impulsiveness 2  Lack of understanding of one's physical/cognitive limitations 0  Total Score 14  Patient Fall Risk Level High fall risk  Adult Fall Risk Interventions  Required Bundle Interventions *See Row Information* High fall risk - low, moderate, and high requirements implemented  Additional Interventions Use of appropriate toileting equipment (bedpan, BSC, etc.)  Screening for Fall Injury Risk (To be completed on HIGH fall risk patients) - Assessing Need for Floor Mats  Risk For Fall Injury- Criteria for Floor Mats 85 years or older  Will Implement Floor Mats Yes  Vitals  Temp 98.4 F (36.9 C)  Temp Source Oral  BP 140/69  MAP (mmHg) 88  BP Location Left Arm  BP Method Automatic  Patient Position (if appropriate) Sitting  Pulse Rate (!) 57  Pulse Rate Source Dinamap  Resp 20  Oxygen Therapy  SpO2 100 %  O2 Device Room Air  Pain Assessment  Pain Scale 0-10  Pain Score 4  Pain Type Acute pain  Pain Location Head  Pain Orientation Posterior  Pain  Descriptors / Indicators Aching  Pain Onset Sudden  Patients Stated Pain Goal 0  Pain Intervention(s) Cold applied  Multiple Pain Sites Yes  2nd Pain Site  Pain Score 5  Pain Type Surgical pain  Pain Location Wrist  Pain Orientation Right  Pain Descriptors / Indicators Aching  Pain Frequency Constant  Pain Onset On-going  Patient's Stated Pain Goal 0  Pain Intervention(s) MD notified (Comment);RN made aware;Repositioned;Cold applied;Medication (See eMAR)  Neurological  Neuro (WDL) X  Level of Consciousness Alert  Orientation Level Oriented to person;Oriented to time;Oriented to place;Oriented to situation  Cognition Follows commands  Speech Clear  Pupil Assessment  Yes  R Pupil Size (mm) 3  R Pupil Shape Round  R Pupil Reaction Brisk  L Pupil Size (mm) 3  L Pupil Shape Round  L Pupil Reaction Brisk  Additional Pupil Assessments No  Motor Function/Sensation Assessment Motor response  R Hand Grip Weak  L Hand Grip Strong  R Foot Dorsiflexion Present;Strong  L Foot Dorsiflexion Present;Strong  R Foot Plantar Flexion Present  L Foot Plantar Flexion Strong;Present  RUE Motor Response Purposeful movement  RUE Sensation Pain  RUE Motor Strength 3  LUE Motor Response Purposeful movement  LUE Sensation Full sensation  LUE Motor Strength 5  RLE Motor Response Responds to sound  RLE Sensation Full sensation  RLE Motor Strength 5  LLE Motor Response Purposeful movement  LLE Sensation Full sensation  LLE Motor Strength 5  Neuro Symptoms None  Glasgow Coma Scale  Eye Opening 4  Best Verbal Response (NON-intubated) 5  Best Motor  Response 6  Glasgow Coma Scale Score 15  Musculoskeletal  Musculoskeletal (WDL) X  Assistive Device None  Generalized Weakness Yes  Weight Bearing Restrictions No  Integumentary  Integumentary (WDL) X  RN Assisting with Skin Assessment on Admission Nyche RN  Skin Color Pale  Skin Condition Dry  Skin Integrity Abrasion;Ecchymosis (head  laceration)  Abrasion Location Face;Arm  Abrasion Location Orientation Right  Abrasion Intervention Cleansed  Ecchymosis Location Arm  Ecchymosis Location Orientation Right  Skin Turgor Non-tenting

## 2021-10-07 MED ORDER — SODIUM CHLORIDE 0.9% FLUSH
3.0000 mL | Freq: Two times a day (BID) | INTRAVENOUS | Status: DC
  Administered 2021-10-07 – 2021-10-10 (×7): 3 mL via INTRAVENOUS

## 2021-10-07 MED ORDER — SODIUM CHLORIDE 0.9% FLUSH
3.0000 mL | INTRAVENOUS | Status: DC | PRN
  Administered 2021-10-07: 09:00:00 3 mL via INTRAVENOUS

## 2021-10-07 MED ORDER — BACITRACIN ZINC 500 UNIT/GM EX OINT
TOPICAL_OINTMENT | Freq: Two times a day (BID) | CUTANEOUS | Status: DC
  Administered 2021-10-09: 1 via TOPICAL
  Filled 2021-10-07: qty 28.35

## 2021-10-07 MED ORDER — SODIUM CHLORIDE 0.9 % IV SOLN: 250.0000 mL | INTRAVENOUS | Status: DC | PRN

## 2021-10-07 MED ORDER — MORPHINE SULFATE (PF) 2 MG/ML IV SOLN
2.0000 mg | INTRAVENOUS | Status: DC | PRN
  Administered 2021-10-07 – 2021-10-08 (×3): 2 mg via INTRAVENOUS
  Filled 2021-10-07 (×4): qty 1

## 2021-10-07 MED ORDER — HYDROGEN PEROXIDE 3 % EX SOLN
Freq: Once | CUTANEOUS | Status: AC
  Filled 2021-10-07: qty 473

## 2021-10-07 MED ORDER — METHOCARBAMOL 500 MG PO TABS
500.0000 mg | ORAL_TABLET | Freq: Three times a day (TID) | ORAL | Status: DC
  Administered 2021-10-07 – 2021-10-10 (×10): 500 mg via ORAL
  Filled 2021-10-07 (×11): qty 1

## 2021-10-07 MED ORDER — CEPHALEXIN 250 MG PO CAPS
250.0000 mg | ORAL_CAPSULE | Freq: Four times a day (QID) | ORAL | Status: DC
  Administered 2021-10-07 – 2021-10-10 (×13): 250 mg via ORAL
  Filled 2021-10-07 (×14): qty 1
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
## 2021-10-07 NOTE — Progress Notes (Signed)
Inpatient Rehab Admissions Coordinator:  Spoke with pt's daughter, [name] on the telephone. Explained CIR goals and expectations to her. She acknowledged understanding and is supportive of pt pursuing CIR. [name] also confirmed that she can provided 24/7 assistance/supervision for pt after discharge. Will continue to follow.  Gayland Curry, [place], [place] Admissions Coordinator [phone]
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
## 2021-10-07 NOTE — Progress Notes (Signed)
Pt A/O x 3 at this time, pleasant. Expressed the joy of being off Dilaudid. Morphine "worked well" Bed in low position, alarms are on, call bell in reach, family at the bedside continuously
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
## 2021-10-07 NOTE — Plan of Care (Signed)
More oriented by the end of the day. Family at the bedside. Ambulated with assist to [facility] and back to bed. Education and emotional support provided.

## 2021-10-08 ENCOUNTER — Telehealth: Payer: Medicare Other

## 2021-10-08 LAB — BASIC METABOLIC PANEL
Anion gap: 9 (ref 5–15)
BUN: 14 mg/dL (ref 8–23)
CO2: 24 mmol/L (ref 22–32)
Calcium: 8.2 mg/dL — ABNORMAL LOW (ref 8.9–10.3)
Chloride: 103 mmol/L (ref 98–111)
Creatinine, Ser: 0.98 mg/dL (ref 0.44–1.00)
GFR, Estimated: 56 mL/min — ABNORMAL LOW (ref >60)
Glucose, Bld: 88 mg/dL (ref 70–99)
Potassium: 3.4 mmol/L — ABNORMAL LOW (ref 3.5–5.1)
Sodium: 136 mmol/L (ref 135–145)

## 2021-10-08 MED ORDER — ENSURE ENLIVE PO LIQD
237.0000 mL | Freq: Two times a day (BID) | ORAL | Status: DC
  Administered 2021-10-08 – 2021-10-10 (×6): 237 mL via ORAL

## 2021-10-08 MED ORDER — POTASSIUM CHLORIDE 10 MEQ/100ML IV SOLN
10.0000 meq | INTRAVENOUS | Status: AC
  Administered 2021-10-08 (×4): 10 meq via INTRAVENOUS
  Filled 2021-10-08 (×4): qty 100

## 2021-10-08 MED ORDER — POTASSIUM CHLORIDE 20 MEQ PO PACK: 20.0000 meq | PACK | Freq: Once | ORAL | Status: DC

## 2021-10-08 MED ORDER — TRAMADOL HCL 50 MG PO TABS
50.0000 mg | ORAL_TABLET | Freq: Four times a day (QID) | ORAL | Status: DC | PRN
  Administered 2021-10-08: 21:00:00 100 mg via ORAL
  Administered 2021-10-08: 11:00:00 50 mg via ORAL
  Administered 2021-10-09 – 2021-10-10 (×5): 100 mg via ORAL
  Administered 2021-10-10: 09:00:00 50 mg via ORAL
  Filled 2021-10-08 (×4): qty 2
  Filled 2021-10-08: qty 1
  Filled 2021-10-08 (×3): qty 2
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
## 2021-10-08 NOTE — Progress Notes (Signed)
Mobility Specialist Criteria Algorithm Info.   10/08/21 1430  Pain Assessment  Pain Assessment Faces  Faces Pain Scale 4  Pain Location R wrist/hand/forearm  Pain Descriptors / Indicators Grimacing;Discomfort;Sore  Pain Intervention(s) Limited activity within patient's tolerance;Monitored during session;Repositioned  Mobility  Activity Ambulated in hall (in chair before and after ambulation)  Range of Motion/Exercises Passive;Right arm  Level of Assistance Contact guard assist, steadying assist  Assistive Device Other (Comment)  Distance Ambulated (ft) 240 ft  Mobility Ambulated with assistance in hallway  Mobility Response Tolerated well  Mobility performed by Mobility specialist  Bed Position Chair   Patient received in chair very eager to participate in mobility. Ambulated in hallway, min guard using IV pole to steady. Returned to chair without complaint or incident and was left with all needs met and family present.   10/08/2021 3:46 PM
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
## 2021-10-08 NOTE — Care Management Important Message (Signed)
Important Message  Patient Details  Name: SERINE KEA MRN: [id number] Date of Birth: 1933/09/27   Medicare Important Message Given:  Yes     Hannah Beat 10/08/2021, 12:22 PM

## 2021-10-08 NOTE — Progress Notes (Signed)
Inpatient Rehab Admissions Coordinator:  Awaiting insurance authorization and medical clearance. Will continue to follow.    Gayland Curry, [facility] [place] Admissions Coordinator [phone]

## 2021-10-08 NOTE — Consult Note (Signed)
° °  [facility] Inpatient Consult   10/08/2021  Bridget Lane [birth year] [id number]  [facility] [ACO] Patient:  [name] [employer]  Primary Care Provider:  [name], MD, [place], is an embedded provider with a chronic care management team and program.  Patient is active with the Embedded pharmacist noted in encounters  Patient screened for length of stay hospitalization. Reviewed to assess for potential Embedded Chronic Care Management service needs for post hospital transition.  Review of patient's medical record reveals patient is being recommended for an inpatient rehabilitation transition from acute hospital needs.  Plan:  Continue to follow progress and disposition to assess for post hospital care management needs.    For questions contact:   Natividad Brood, RN BSN [facility] Liaison  [phone] business mobile phone Toll free office [phone]  Fax number: [phone] [email or web address]
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
## 2021-10-09 NOTE — Progress Notes (Signed)
Mobility Specialist Progress Note:   10/09/21 1000  Mobility  Activity Ambulated in hall  Level of Assistance Minimal assist, patient does 75% or more  Assistive Device Other (Comment) (HHA)  Distance Ambulated (ft) 500 ft  Mobility Ambulated with assistance in hallway  Mobility Response Tolerated well  Mobility performed by Mobility specialist  Bed Position Chair  $Mobility charge 1 Mobility   R wrist pain is improved from yesterday per pt. StandbyA required to stand, HHA-minA during ambulation, as distance increased d/t fatigue. Pt back in chair with all needs met.   Nelta Numbers Mobility Specialist  Phone [phone]
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
## 2021-10-09 NOTE — Progress Notes (Signed)
IP rehab admissions - I have received a denial from insurance carrier for acute inpatient rehab admission.  I have called patient's daughter, [name], and have left a message.  Will update all once I speak with patient's family about denial.  Call for questions.  [phone]
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
## 2021-10-10 ENCOUNTER — Other Ambulatory Visit (HOSPITAL_COMMUNITY): Payer: Self-pay

## 2021-10-10 LAB — BASIC METABOLIC PANEL
Anion gap: 11 (ref 5–15)
BUN: 15 mg/dL (ref 8–23)
CO2: 21 mmol/L — ABNORMAL LOW (ref 22–32)
Calcium: 8.5 mg/dL — ABNORMAL LOW (ref 8.9–10.3)
Chloride: 102 mmol/L (ref 98–111)
Creatinine, Ser: 1.04 mg/dL — ABNORMAL HIGH (ref 0.44–1.00)
GFR, Estimated: 52 mL/min — ABNORMAL LOW (ref >60)
Glucose, Bld: 85 mg/dL (ref 70–99)
Potassium: 3.9 mmol/L (ref 3.5–5.1)
Sodium: 134 mmol/L — ABNORMAL LOW (ref 135–145)

## 2021-10-10 MED ORDER — BACITRACIN ZINC 500 UNIT/GM EX OINT
TOPICAL_OINTMENT | Freq: Two times a day (BID) | CUTANEOUS | 0 refills | Status: DC
  Filled 2021-10-10: qty 28.4, 7d supply, fill #0

## 2021-10-10 MED ORDER — ALPRAZOLAM 0.5 MG PO TABS: 0.5000 mg | ORAL_TABLET | Freq: Every evening | ORAL | Status: DC | PRN

## 2021-10-10 MED ORDER — CEPHALEXIN 250 MG PO CAPS
250.0000 mg | ORAL_CAPSULE | Freq: Four times a day (QID) | ORAL | 0 refills | Status: DC
  Filled 2021-10-10: qty 6, 2d supply, fill #0

## 2021-10-10 MED ORDER — TRAMADOL HCL 50 MG PO TABS
50.0000 mg | ORAL_TABLET | Freq: Four times a day (QID) | ORAL | 0 refills | Status: DC | PRN
  Filled 2021-10-10: qty 10, 3d supply, fill #0

## 2021-10-10 MED ORDER — ACETAMINOPHEN 500 MG PO TABS: 1000.0000 mg | ORAL_TABLET | Freq: Four times a day (QID) | ORAL | Status: DC | PRN

## 2021-10-10 MED ORDER — DIPHENHYDRAMINE HCL 25 MG PO CAPS
25.0000 mg | ORAL_CAPSULE | Freq: Four times a day (QID) | ORAL | Status: DC | PRN
  Administered 2021-10-10 (×2): 25 mg via ORAL
  Filled 2021-10-10 (×2): qty 1
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
## 2021-10-10 NOTE — Plan of Care (Signed)
°  Problem: Coping: Goal: Level of anxiety will decrease Outcome: Not Progressing   Problem: Elimination: Goal: Will not experience complications related to bowel motility Outcome: Not Progressing   Problem: Pain Managment: Goal: General experience of comfort will improve Outcome: Not Progressing   Problem: Skin Integrity: Goal: Risk for impaired skin integrity will decrease Outcome: Not Progressing
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
## 2021-10-10 NOTE — Progress Notes (Signed)
Mobility Specialist Progress Note:   10/10/21 1200  Mobility  Activity Ambulated in hall;Ambulated to bathroom  Level of Assistance Minimal assist, patient does 75% or more  Assistive Device Other (Comment) (HHA)  Distance Ambulated (ft) 570 ft  Mobility Out of bed for toileting;Ambulated with assistance in hallway  Mobility Response Tolerated well  Mobility performed by Mobility specialist  $Mobility charge 1 Mobility   Pt requesting to go to BR prior to mobility to void. Required minA to stand from chair/toilet. Pt asx during ambulation, in bed with bed alarm on.   Nelta Numbers Mobility Specialist  Phone [phone]

## 2021-10-10 NOTE — Progress Notes (Signed)
PT Cancellation Note  Patient Details Name: Bridget Lane MRN: [id number] DOB: Jan 20, 1933   Cancelled Treatment:    Reason Eval/Treat Not Completed: (P) Other (comment) (Pt with mobility specialist will f/u per POC.)   Legacy Carrender J Stann Mainland 10/10/2021, 12:11 PM  Erasmo Leventhal , PTA Acute Rehabilitation Services Pager [phone] Office [phone]

## 2021-10-10 NOTE — TOC Transition Note (Signed)
Transition of Care [facility]) - CM/SW Discharge Note   Patient Details  Name: Bridget Lane MRN: [id number] Date of Birth: 1933/09/13  Transition of Care [facility]) CM/SW Contact:  Ella Bodo, RN Phone Number: 10/10/2021, 2:54 PM   Clinical Narrative:    Patient has been denied for inpatient rehab stay by insurance.  Patient/family prefers to take patient home with home health therapies.  Spoke with patient's daughter, [name]: She states patient will discharge home with her and her husband in [place].  Discharge address is [address] [place], [place] [address].  Daughter's phone number is [phone].  Referral to [facility] health for continued PT and OT, as recommended by physical and Occupational Therapy.  Patient has cane at home, and daughter states she has handicapped height toilets, and does not need 3 in 1.  Final next level of care: [place] Barriers to Discharge: Barriers Resolved   Patient Goals and CMS Choice Patient states their goals for this hospitalization and ongoing recovery are:: to go home CMS Medicare.gov Compare Post Acute Care list provided to:: Patient Represenative (must comment) (daughter) Choice offered to / list presented to : Adult Children                        Discharge Plan and Services In-house Referral: Clinical Social Work   Post Acute Care Choice: Home Health                    HH Arranged: PT, OT [facility] Agency: [place] Date [facility] Agency Contacted: 10/10/21 Time Trooper: 1438 Representative spoke with at [place]: Adela Lank  Social Determinants of Health ([place]) Interventions     Readmission Risk Interventions No flowsheet data found.  Reinaldo Raddle, RN, BSN  Trauma/Neuro ICU Case Manager [phone]

## 2021-10-10 NOTE — Progress Notes (Signed)
IP rehab admissions - I spoke with patient's daughter, [name], and explained denial for inpatient rehab stay from insurance case [employer].  I do not feel that we can appeal this case with success.  Daughter would like to take patient home with [facility] therapies.  I will let TOC team know of daughter's wishes.  Call me for questions.  [phone]
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
## 2021-10-10 NOTE — Care Management Important Message (Signed)
Important Message  Patient Details  Name: Bridget Lane MRN: [id number] Date of Birth: [birth year]   Medicare Important Message Given:  Yes     Hannah Beat 10/10/2021, 2:07 PM

## 2021-10-10 NOTE — Discharge Instructions (Signed)
Change the dressing on your right forearm twice a day - apply a small amount of bacitracin ointment to the wound and then wrap with dry guaze and secure with tape. You can shower and allow water to run over wound - do not scrub. Elevate arm and use ice as needed for pain and swelling - always use a barrier between your skin and ice pack. Complete antibiotic as prescribed and follow up with your PCP within one week to recheck the wound. Please call us [facility] Surgery), PCP, or go to the ED if wound is worsening such as increasing redness, swelling, heat or you develop fever or chills.

## 2021-10-12 ENCOUNTER — Encounter: Payer: Self-pay | Admitting: Family Medicine

## 2021-10-12 ENCOUNTER — Other Ambulatory Visit: Payer: Self-pay | Admitting: Physician Assistant

## 2021-10-12 ENCOUNTER — Telehealth (INDEPENDENT_AMBULATORY_CARE_PROVIDER_SITE_OTHER): Payer: Medicare Other | Admitting: Family Medicine

## 2021-10-12 DIAGNOSIS — M48061 Spinal stenosis, lumbar region without neurogenic claudication: Secondary | ICD-10-CM | POA: Diagnosis not present

## 2021-10-12 DIAGNOSIS — S01511D Laceration without foreign body of lip, subsequent encounter: Secondary | ICD-10-CM | POA: Diagnosis not present

## 2021-10-12 DIAGNOSIS — I700 Atherosclerosis of aorta: Secondary | ICD-10-CM | POA: Diagnosis not present

## 2021-10-12 DIAGNOSIS — M4312 Spondylolisthesis, cervical region: Secondary | ICD-10-CM | POA: Diagnosis not present

## 2021-10-12 DIAGNOSIS — M47812 Spondylosis without myelopathy or radiculopathy, cervical region: Secondary | ICD-10-CM | POA: Diagnosis not present

## 2021-10-12 DIAGNOSIS — L089 Local infection of the skin and subcutaneous tissue, unspecified: Secondary | ICD-10-CM

## 2021-10-12 DIAGNOSIS — S0012XD Contusion of left eyelid and periocular area, subsequent encounter: Secondary | ICD-10-CM | POA: Diagnosis not present

## 2021-10-12 DIAGNOSIS — E785 Hyperlipidemia, unspecified: Secondary | ICD-10-CM | POA: Diagnosis not present

## 2021-10-12 DIAGNOSIS — T148XXA Other injury of unspecified body region, initial encounter: Secondary | ICD-10-CM | POA: Diagnosis not present

## 2021-10-12 DIAGNOSIS — S025XXD Fracture of tooth (traumatic), subsequent encounter for fracture with routine healing: Secondary | ICD-10-CM | POA: Diagnosis not present

## 2021-10-12 DIAGNOSIS — I129 Hypertensive chronic kidney disease with stage 1 through stage 4 chronic kidney disease, or unspecified chronic kidney disease: Secondary | ICD-10-CM | POA: Diagnosis not present

## 2021-10-12 DIAGNOSIS — M109 Gout, unspecified: Secondary | ICD-10-CM

## 2021-10-12 DIAGNOSIS — I4819 Other persistent atrial fibrillation: Secondary | ICD-10-CM | POA: Diagnosis not present

## 2021-10-12 DIAGNOSIS — S61511A Laceration without foreign body of right wrist, initial encounter: Secondary | ICD-10-CM | POA: Diagnosis not present

## 2021-10-12 DIAGNOSIS — M4316 Spondylolisthesis, lumbar region: Secondary | ICD-10-CM | POA: Diagnosis not present

## 2021-10-12 DIAGNOSIS — N183 Chronic kidney disease, stage 3 unspecified: Secondary | ICD-10-CM | POA: Diagnosis not present

## 2021-10-12 DIAGNOSIS — I480 Paroxysmal atrial fibrillation: Secondary | ICD-10-CM

## 2021-10-12 DIAGNOSIS — F3341 Major depressive disorder, recurrent, in partial remission: Secondary | ICD-10-CM

## 2021-10-12 DIAGNOSIS — F32A Depression, unspecified: Secondary | ICD-10-CM | POA: Diagnosis not present

## 2021-10-12 DIAGNOSIS — R739 Hyperglycemia, unspecified: Secondary | ICD-10-CM

## 2021-10-12 DIAGNOSIS — F0393 Unspecified dementia, unspecified severity, with mood disturbance: Secondary | ICD-10-CM | POA: Diagnosis not present

## 2021-10-12 DIAGNOSIS — K219 Gastro-esophageal reflux disease without esophagitis: Secondary | ICD-10-CM | POA: Diagnosis not present

## 2021-10-12 DIAGNOSIS — S065X1D Traumatic subdural hemorrhage with loss of consciousness of 30 minutes or less, subsequent encounter: Secondary | ICD-10-CM | POA: Diagnosis not present

## 2021-10-12 MED ORDER — CEPHALEXIN 250 MG PO CAPS: 250.0000 mg | ORAL_CAPSULE | Freq: Four times a day (QID) | ORAL | 0 refills | Status: DC

## 2021-10-12 MED ORDER — HYDROCODONE-ACETAMINOPHEN 5-325 MG PO TABS: 1.0000 | ORAL_TABLET | Freq: Four times a day (QID) | ORAL | 0 refills | Status: DC | PRN
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
## 2021-10-14 ENCOUNTER — Encounter (HOSPITAL_COMMUNITY): Payer: Self-pay

## 2021-10-14 ENCOUNTER — Inpatient Hospital Stay (HOSPITAL_COMMUNITY)
Admission: EM | Admit: 2021-10-14 | Discharge: 2021-10-20 | DRG: 605 | Disposition: A | Discharge status: Home Health | Payer: Medicare Other | Attending: Internal Medicine | Admitting: Internal Medicine

## 2021-10-14 ENCOUNTER — Inpatient Hospital Stay (HOSPITAL_COMMUNITY): Payer: Medicare Other

## 2021-10-14 ENCOUNTER — Other Ambulatory Visit: Payer: Self-pay

## 2021-10-14 ENCOUNTER — Emergency Department (HOSPITAL_COMMUNITY): Payer: Medicare Other

## 2021-10-14 DIAGNOSIS — E785 Hyperlipidemia, unspecified: Secondary | ICD-10-CM | POA: Diagnosis not present

## 2021-10-14 DIAGNOSIS — Z885 Allergy status to narcotic agent status: Secondary | ICD-10-CM | POA: Diagnosis not present

## 2021-10-14 DIAGNOSIS — M7989 Other specified soft tissue disorders: Secondary | ICD-10-CM | POA: Diagnosis not present

## 2021-10-14 DIAGNOSIS — Z6825 Body mass index (BMI) 25.0-25.9, adult: Secondary | ICD-10-CM | POA: Diagnosis not present

## 2021-10-14 DIAGNOSIS — Z833 Family history of diabetes mellitus: Secondary | ICD-10-CM

## 2021-10-14 DIAGNOSIS — Z981 Arthrodesis status: Secondary | ICD-10-CM

## 2021-10-14 DIAGNOSIS — M009 Pyogenic arthritis, unspecified: Secondary | ICD-10-CM | POA: Diagnosis not present

## 2021-10-14 DIAGNOSIS — L089 Local infection of the skin and subcutaneous tissue, unspecified: Secondary | ICD-10-CM

## 2021-10-14 DIAGNOSIS — R600 Localized edema: Secondary | ICD-10-CM | POA: Diagnosis not present

## 2021-10-14 DIAGNOSIS — S52591A Other fractures of lower end of right radius, initial encounter for closed fracture: Secondary | ICD-10-CM | POA: Diagnosis present

## 2021-10-14 DIAGNOSIS — Z888 Allergy status to other drugs, medicaments and biological substances status: Secondary | ICD-10-CM | POA: Diagnosis not present

## 2021-10-14 DIAGNOSIS — S61501A Unspecified open wound of right wrist, initial encounter: Secondary | ICD-10-CM | POA: Diagnosis not present

## 2021-10-14 DIAGNOSIS — G4733 Obstructive sleep apnea (adult) (pediatric): Secondary | ICD-10-CM | POA: Diagnosis present

## 2021-10-14 DIAGNOSIS — I480 Paroxysmal atrial fibrillation: Secondary | ICD-10-CM

## 2021-10-14 DIAGNOSIS — I739 Peripheral vascular disease, unspecified: Secondary | ICD-10-CM | POA: Diagnosis present

## 2021-10-14 DIAGNOSIS — I4819 Other persistent atrial fibrillation: Secondary | ICD-10-CM | POA: Diagnosis present

## 2021-10-14 DIAGNOSIS — M19031 Primary osteoarthritis, right wrist: Secondary | ICD-10-CM | POA: Diagnosis not present

## 2021-10-14 DIAGNOSIS — Z8249 Family history of ischemic heart disease and other diseases of the circulatory system: Secondary | ICD-10-CM

## 2021-10-14 DIAGNOSIS — I4891 Unspecified atrial fibrillation: Secondary | ICD-10-CM | POA: Diagnosis not present

## 2021-10-14 DIAGNOSIS — Z803 Family history of malignant neoplasm of breast: Secondary | ICD-10-CM | POA: Diagnosis not present

## 2021-10-14 DIAGNOSIS — B954 Other streptococcus as the cause of diseases classified elsewhere: Secondary | ICD-10-CM | POA: Diagnosis present

## 2021-10-14 DIAGNOSIS — M2550 Pain in unspecified joint: Secondary | ICD-10-CM | POA: Diagnosis not present

## 2021-10-14 DIAGNOSIS — R748 Abnormal levels of other serum enzymes: Secondary | ICD-10-CM | POA: Diagnosis not present

## 2021-10-14 DIAGNOSIS — I959 Hypotension, unspecified: Secondary | ICD-10-CM | POA: Diagnosis not present

## 2021-10-14 DIAGNOSIS — N1831 Chronic kidney disease, stage 3a: Secondary | ICD-10-CM | POA: Diagnosis not present

## 2021-10-14 DIAGNOSIS — Z853 Personal history of malignant neoplasm of breast: Secondary | ICD-10-CM | POA: Diagnosis not present

## 2021-10-14 DIAGNOSIS — Z87898 Personal history of other specified conditions: Secondary | ICD-10-CM | POA: Diagnosis not present

## 2021-10-14 DIAGNOSIS — F0390 Unspecified dementia without behavioral disturbance: Secondary | ICD-10-CM | POA: Diagnosis present

## 2021-10-14 DIAGNOSIS — Z79899 Other long term (current) drug therapy: Secondary | ICD-10-CM

## 2021-10-14 DIAGNOSIS — Z800 Family history of malignant neoplasm of digestive organs: Secondary | ICD-10-CM | POA: Diagnosis not present

## 2021-10-14 DIAGNOSIS — M25531 Pain in right wrist: Secondary | ICD-10-CM | POA: Diagnosis not present

## 2021-10-14 DIAGNOSIS — D75839 Thrombocytosis, unspecified: Secondary | ICD-10-CM

## 2021-10-14 DIAGNOSIS — E440 Moderate protein-calorie malnutrition: Secondary | ICD-10-CM | POA: Diagnosis not present

## 2021-10-14 DIAGNOSIS — Z789 Other specified health status: Secondary | ICD-10-CM

## 2021-10-14 DIAGNOSIS — Y92410 Unspecified street and highway as the place of occurrence of the external cause: Secondary | ICD-10-CM

## 2021-10-14 DIAGNOSIS — K219 Gastro-esophageal reflux disease without esophagitis: Secondary | ICD-10-CM | POA: Diagnosis present

## 2021-10-14 DIAGNOSIS — D649 Anemia, unspecified: Secondary | ICD-10-CM | POA: Diagnosis present

## 2021-10-14 DIAGNOSIS — Z880 Allergy status to penicillin: Secondary | ICD-10-CM

## 2021-10-14 LAB — COMPREHENSIVE METABOLIC PANEL
ALT: 40 U/L (ref 0–44)
AST: 38 U/L (ref 15–41)
Albumin: 2.7 g/dL — ABNORMAL LOW (ref 3.5–5.0)
Alkaline Phosphatase: 147 U/L — ABNORMAL HIGH (ref 38–126)
Anion gap: 10 (ref 5–15)
BUN: 14 mg/dL (ref 8–23)
CO2: 22 mmol/L (ref 22–32)
Calcium: 9.1 mg/dL (ref 8.9–10.3)
Chloride: 103 mmol/L (ref 98–111)
Creatinine, Ser: 1.12 mg/dL — ABNORMAL HIGH (ref 0.44–1.00)
GFR, Estimated: 48 mL/min — ABNORMAL LOW (ref >60)
Glucose, Bld: 101 mg/dL — ABNORMAL HIGH (ref 70–99)
Potassium: 4.1 mmol/L (ref 3.5–5.1)
Sodium: 135 mmol/L (ref 135–145)
Total Bilirubin: 0.9 mg/dL (ref 0.3–1.2)
Total Protein: 6.2 g/dL — ABNORMAL LOW (ref 6.5–8.1)

## 2021-10-14 LAB — CBC WITH DIFFERENTIAL/PLATELET
Abs Immature Granulocytes: 0.17 10*3/uL — ABNORMAL HIGH (ref 0.00–0.07)
Basophils Absolute: 0.1 10*3/uL (ref 0.0–0.1)
Basophils Relative: 1 %
Eosinophils Absolute: 0.1 10*3/uL (ref 0.0–0.5)
Eosinophils Relative: 1 %
HCT: 29.5 % — ABNORMAL LOW (ref 36.0–46.0)
Hemoglobin: 9.6 g/dL — ABNORMAL LOW (ref 12.0–15.0)
Immature Granulocytes: 2 %
Lymphocytes Relative: 19 %
Lymphs Abs: 1.7 10*3/uL (ref 0.7–4.0)
MCH: 28.7 pg (ref 26.0–34.0)
MCHC: 32.5 g/dL (ref 30.0–36.0)
MCV: 88.1 fL (ref 80.0–100.0)
Monocytes Absolute: 0.7 10*3/uL (ref 0.1–1.0)
Monocytes Relative: 8 %
Neutro Abs: 6.5 10*3/uL (ref 1.7–7.7)
Neutrophils Relative %: 69 %
Platelets: 709 10*3/uL — ABNORMAL HIGH (ref 150–400)
RBC: 3.35 MIL/uL — ABNORMAL LOW (ref 3.87–5.11)
RDW: 13.9 % (ref 11.5–15.5)
WBC: 9.3 10*3/uL (ref 4.0–10.5)
nRBC: 0 % (ref 0.0–0.2)

## 2021-10-14 LAB — LACTIC ACID, PLASMA
Lactic Acid, Venous: 1 mmol/L (ref 0.5–1.9)
Lactic Acid, Venous: 0.8 mmol/L (ref 0.5–1.9)

## 2021-10-14 LAB — C-REACTIVE PROTEIN: CRP: 7.8 mg/dL — ABNORMAL HIGH (ref <1.0)

## 2021-10-14 LAB — SEDIMENTATION RATE
Sed Rate: 87 mm/hr — ABNORMAL HIGH (ref 0–22)
Sed Rate: 78 mm/hr — ABNORMAL HIGH (ref 0–22)

## 2021-10-14 MED ORDER — SODIUM CHLORIDE 0.9 % IV SOLN: INTRAVENOUS | Status: DC

## 2021-10-14 MED ORDER — METOPROLOL TARTRATE 25 MG PO TABS: 25.0000 mg | ORAL_TABLET | Freq: Two times a day (BID) | ORAL | Status: DC

## 2021-10-14 MED ORDER — HYDROCODONE-ACETAMINOPHEN 5-325 MG PO TABS
1.0000 | ORAL_TABLET | ORAL | Status: DC | PRN
  Administered 2021-10-14 – 2021-10-20 (×20): 1 via ORAL
  Filled 2021-10-14 (×21): qty 1

## 2021-10-14 MED ORDER — ENOXAPARIN SODIUM 40 MG/0.4ML IJ SOSY
40.0000 mg | PREFILLED_SYRINGE | INTRAMUSCULAR | Status: DC
  Administered 2021-10-14 – 2021-10-18 (×4): 40 mg via SUBCUTANEOUS
  Filled 2021-10-14 (×3): qty 0.4

## 2021-10-14 MED ORDER — ROSUVASTATIN CALCIUM 5 MG PO TABS
5.0000 mg | ORAL_TABLET | Freq: Every day | ORAL | Status: DC
  Administered 2021-10-15 – 2021-10-20 (×6): 5 mg via ORAL
  Filled 2021-10-14 (×6): qty 1

## 2021-10-14 MED ORDER — ONDANSETRON HCL 4 MG/2ML IJ SOLN: 4.0000 mg | Freq: Four times a day (QID) | INTRAMUSCULAR | Status: DC | PRN

## 2021-10-14 MED ORDER — DIPHENHYDRAMINE HCL 50 MG/ML IJ SOLN
12.5000 mg | Freq: Four times a day (QID) | INTRAMUSCULAR | Status: DC | PRN
  Administered 2021-10-17: 03:00:00 12.5 mg via INTRAVENOUS
  Filled 2021-10-14: qty 1

## 2021-10-14 MED ORDER — HYDROCODONE-ACETAMINOPHEN 5-325 MG PO TABS: 1.0000 | ORAL_TABLET | Freq: Four times a day (QID) | ORAL | Status: DC | PRN

## 2021-10-14 MED ORDER — FLECAINIDE ACETATE 100 MG PO TABS
100.0000 mg | ORAL_TABLET | Freq: Two times a day (BID) | ORAL | Status: DC
  Administered 2021-10-14 – 2021-10-20 (×12): 100 mg via ORAL
  Filled 2021-10-14 (×15): qty 1

## 2021-10-14 MED ORDER — ONDANSETRON HCL 4 MG PO TABS: 4.0000 mg | ORAL_TABLET | Freq: Four times a day (QID) | ORAL | Status: DC | PRN

## 2021-10-14 MED ORDER — VANCOMYCIN HCL 750 MG/150ML IV SOLN
750.0000 mg | INTRAVENOUS | Status: DC
  Administered 2021-10-15: 16:00:00 750 mg via INTRAVENOUS
  Filled 2021-10-14 (×2): qty 150

## 2021-10-14 MED ORDER — ALBUTEROL SULFATE (2.5 MG/3ML) 0.083% IN NEBU: 2.5000 mg | INHALATION_SOLUTION | Freq: Four times a day (QID) | RESPIRATORY_TRACT | Status: DC | PRN

## 2021-10-14 MED ORDER — DULOXETINE HCL 60 MG PO CPEP
60.0000 mg | ORAL_CAPSULE | Freq: Every day | ORAL | Status: DC
  Administered 2021-10-15 – 2021-10-20 (×6): 60 mg via ORAL
  Filled 2021-10-14 (×6): qty 1

## 2021-10-14 MED ORDER — DIPHENHYDRAMINE HCL 50 MG/ML IJ SOLN
12.5000 mg | Freq: Once | INTRAMUSCULAR | Status: AC
  Administered 2021-10-14: 16:00:00 12.5 mg via INTRAVENOUS
  Filled 2021-10-14: qty 1

## 2021-10-14 MED ORDER — ACETAMINOPHEN 325 MG PO TABS
650.0000 mg | ORAL_TABLET | Freq: Four times a day (QID) | ORAL | Status: DC | PRN
  Administered 2021-10-15 – 2021-10-19 (×6): 650 mg via ORAL
  Filled 2021-10-14 (×6): qty 2

## 2021-10-14 MED ORDER — VANCOMYCIN HCL IN DEXTROSE 1-5 GM/200ML-% IV SOLN
1000.0000 mg | Freq: Once | INTRAVENOUS | Status: AC
  Administered 2021-10-14: 14:00:00 1000 mg via INTRAVENOUS
  Filled 2021-10-14: qty 200

## 2021-10-14 MED ORDER — METOPROLOL SUCCINATE ER 25 MG PO TB24
25.0000 mg | ORAL_TABLET | Freq: Every day | ORAL | Status: DC
Start: 2021-10-14 — End: 2021-10-15
  Administered 2021-10-15: 10:00:00 25 mg via ORAL
  Filled 2021-10-14: qty 1

## 2021-10-14 MED ORDER — ACETAMINOPHEN 650 MG RE SUPP: 650.0000 mg | Freq: Four times a day (QID) | RECTAL | Status: DC | PRN

## 2021-10-14 MED ORDER — SODIUM CHLORIDE 0.9% FLUSH
3.0000 mL | Freq: Two times a day (BID) | INTRAVENOUS | Status: DC
  Administered 2021-10-15 – 2021-10-20 (×10): 3 mL via INTRAVENOUS

## 2021-10-14 MED ORDER — POLYVINYL ALCOHOL 1.4 % OP SOLN
1.0000 [drp] | Freq: Every day | OPHTHALMIC | Status: DC | PRN
Start: 2021-10-14 — End: 2021-10-20
  Filled 2021-10-14: qty 15
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
## 2021-10-14 NOTE — Progress Notes (Signed)
Pharmacy Antibiotic Note  Bridget Lane is a 87 y.o. female admitted on 10/14/2021 presenting with hand wound infection, failed PTA PO abx.  Pharmacy has been consulted for vancomycin dosing, 1000 mg given in ED.  Plan: Vancomycin 750 mg IV q 24h (eAUC 506, Goal AUC 400-550, SCr 1.12) Monitor renal function, Cx and surgical plans Vancomycin levels as needed  Height: 5' 4.5" (163.8 cm) Weight: 68 kg (150 lb) IBW/kg (Calculated) : 55.85  Temp (24hrs), Avg:98.2 F (36.8 C), Min:98.2 F (36.8 C), Max:98.2 F (36.8 C)  Recent Labs  Lab 10/08/21 0047 10/10/21 0136 10/14/21 1159  WBC  --   --  9.3  CREATININE 0.98 1.04* 1.12*  LATICACIDVEN  --   --  1.0    Estimated Creatinine Clearance: 34.6 mL/min (A) (by C-G formula based on SCr of 1.12 mg/dL (H)).    Allergies  Allergen Reactions   Penicillins Rash   Codeine Other (See Comments)    hallucinations hallucinations   Codeine Other (See Comments)    nightmares   Dabigatran Etexilate Mesylate     Unk reaction   Dilaudid [Hydromorphone]     Rash with this in December 2022 hospitalization. hallucinations   Acetaminophen Other (See Comments)    hurts stomach hurts stomach   Dabigatran Etexilate Mesylate Other (See Comments)     All extremities feel heavy and hurt   Dabigatran Etexilate Mesylate Other (See Comments)     All extremities feel heavy and hurt   Penicillins Rash   Sulfa Antibiotics Rash   Sulfonamide Derivatives Rash    Bertis Ruddy, PharmD Clinical Pharmacist ED Pharmacist Phone # [phone] 10/14/2021 3:28 PM
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
## 2021-10-15 DIAGNOSIS — M009 Pyogenic arthritis, unspecified: Secondary | ICD-10-CM | POA: Diagnosis not present

## 2021-10-15 LAB — BASIC METABOLIC PANEL
Anion gap: 13 (ref 5–15)
BUN: 10 mg/dL (ref 8–23)
CO2: 19 mmol/L — ABNORMAL LOW (ref 22–32)
Calcium: 8.7 mg/dL — ABNORMAL LOW (ref 8.9–10.3)
Chloride: 105 mmol/L (ref 98–111)
Creatinine, Ser: 0.97 mg/dL (ref 0.44–1.00)
GFR, Estimated: 57 mL/min — ABNORMAL LOW (ref >60)
Glucose, Bld: 98 mg/dL (ref 70–99)
Potassium: 3.3 mmol/L — ABNORMAL LOW (ref 3.5–5.1)
Sodium: 137 mmol/L (ref 135–145)

## 2021-10-15 LAB — CBC
HCT: 29.1 % — ABNORMAL LOW (ref 36.0–46.0)
Hemoglobin: 9.4 g/dL — ABNORMAL LOW (ref 12.0–15.0)
MCH: 28.2 pg (ref 26.0–34.0)
MCHC: 32.3 g/dL (ref 30.0–36.0)
MCV: 87.4 fL (ref 80.0–100.0)
Platelets: 653 10*3/uL — ABNORMAL HIGH (ref 150–400)
RBC: 3.33 MIL/uL — ABNORMAL LOW (ref 3.87–5.11)
RDW: 14.1 % (ref 11.5–15.5)
WBC: 9.9 10*3/uL (ref 4.0–10.5)
nRBC: 0 % (ref 0.0–0.2)

## 2021-10-15 MED ORDER — METOPROLOL SUCCINATE ER 50 MG PO TB24
50.0000 mg | ORAL_TABLET | Freq: Every day | ORAL | Status: DC
  Administered 2021-10-16 – 2021-10-19 (×4): 50 mg via ORAL
  Filled 2021-10-15 (×5): qty 1

## 2021-10-15 MED ORDER — METOPROLOL SUCCINATE ER 25 MG PO TB24
25.0000 mg | ORAL_TABLET | Freq: Once | ORAL | Status: AC
  Administered 2021-10-15: 14:00:00 25 mg via ORAL
  Filled 2021-10-15: qty 1

## 2021-10-15 MED ORDER — POTASSIUM CHLORIDE 10 MEQ/100ML IV SOLN
10.0000 meq | INTRAVENOUS | Status: AC
  Administered 2021-10-15 (×5): 10 meq via INTRAVENOUS
  Filled 2021-10-15 (×5): qty 100
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
## 2021-10-15 NOTE — Plan of Care (Signed)
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
## 2021-10-16 DIAGNOSIS — M009 Pyogenic arthritis, unspecified: Secondary | ICD-10-CM | POA: Diagnosis not present

## 2021-10-16 LAB — CBC WITH DIFFERENTIAL/PLATELET
Abs Immature Granulocytes: 0.12 10*3/uL — ABNORMAL HIGH (ref 0.00–0.07)
Basophils Absolute: 0.1 10*3/uL (ref 0.0–0.1)
Basophils Relative: 1 %
Eosinophils Absolute: 0.2 10*3/uL (ref 0.0–0.5)
Eosinophils Relative: 2 %
HCT: 26.7 % — ABNORMAL LOW (ref 36.0–46.0)
Hemoglobin: 8.6 g/dL — ABNORMAL LOW (ref 12.0–15.0)
Immature Granulocytes: 1 %
Lymphocytes Relative: 19 %
Lymphs Abs: 2 10*3/uL (ref 0.7–4.0)
MCH: 28.6 pg (ref 26.0–34.0)
MCHC: 32.2 g/dL (ref 30.0–36.0)
MCV: 88.7 fL (ref 80.0–100.0)
Monocytes Absolute: 0.8 10*3/uL (ref 0.1–1.0)
Monocytes Relative: 8 %
Neutro Abs: 6.9 10*3/uL (ref 1.7–7.7)
Neutrophils Relative %: 69 %
Platelets: 574 10*3/uL — ABNORMAL HIGH (ref 150–400)
RBC: 3.01 MIL/uL — ABNORMAL LOW (ref 3.87–5.11)
RDW: 14 % (ref 11.5–15.5)
WBC: 10 10*3/uL (ref 4.0–10.5)
nRBC: 0 % (ref 0.0–0.2)

## 2021-10-16 LAB — BASIC METABOLIC PANEL
Anion gap: 9 (ref 5–15)
BUN: 8 mg/dL (ref 8–23)
CO2: 19 mmol/L — ABNORMAL LOW (ref 22–32)
Calcium: 8.6 mg/dL — ABNORMAL LOW (ref 8.9–10.3)
Chloride: 107 mmol/L (ref 98–111)
Creatinine, Ser: 0.94 mg/dL (ref 0.44–1.00)
GFR, Estimated: 59 mL/min — ABNORMAL LOW (ref >60)
Glucose, Bld: 78 mg/dL (ref 70–99)
Potassium: 4.2 mmol/L (ref 3.5–5.1)
Sodium: 135 mmol/L (ref 135–145)

## 2021-10-16 LAB — IRON AND TIBC
Iron: 46 ug/dL (ref 28–170)
Saturation Ratios: 17 % (ref 10.4–31.8)
TIBC: 276 ug/dL (ref 250–450)
UIBC: 230 ug/dL

## 2021-10-16 LAB — FERRITIN: Ferritin: 82 ng/mL (ref 11–307)

## 2021-10-16 MED ORDER — METRONIDAZOLE 500 MG PO TABS
500.0000 mg | ORAL_TABLET | Freq: Two times a day (BID) | ORAL | Status: DC
Start: 2021-10-16 — End: 2021-10-18
  Administered 2021-10-16 – 2021-10-18 (×5): 500 mg via ORAL
  Filled 2021-10-16 (×6): qty 1

## 2021-10-16 MED ORDER — ENSURE ENLIVE PO LIQD
237.0000 mL | Freq: Two times a day (BID) | ORAL | Status: DC
  Administered 2021-10-16 – 2021-10-20 (×3): 237 mL via ORAL

## 2021-10-16 MED ORDER — ADULT MULTIVITAMIN W/MINERALS CH
1.0000 | ORAL_TABLET | Freq: Every day | ORAL | Status: DC
Start: 2021-10-16 — End: 2021-10-20
  Administered 2021-10-16 – 2021-10-20 (×5): 1 via ORAL
  Filled 2021-10-16 (×5): qty 1

## 2021-10-16 MED ORDER — SODIUM CHLORIDE 0.9 % IV SOLN
2.0000 g | INTRAVENOUS | Status: DC
  Administered 2021-10-16 – 2021-10-19 (×4): 2 g via INTRAVENOUS
  Filled 2021-10-16 (×4): qty 20
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
## 2021-10-16 NOTE — Consult Note (Signed)
[facility] Inpatient Consult   10/16/2021  Bridget Lane 06/17/1933 [id number]  [place] Management [facility] CM)   Patient was reviewed for unplanned readmission less than 30 days. Assessed for potential post hospital transition of care needs. Per review, primary provider office offers embedded chronic care management team and program. Patient is active with the embedded pharmacist noted in encounters.   Plan:  Continue to follow progress and disposition to assess for post hospital care management needs.    Of note, [facility] CM services do not replace nor interfere with any services that are arranged by inpatient case management or social work.   Netta Cedars, MSN, RN [facility] [employer] [phone]  Toll free office [phone]
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
## 2021-10-16 NOTE — Progress Notes (Signed)
Mobility Specialist Progress Note   10/16/21 1830  Mobility  Activity Ambulated in hall  Level of Assistance Contact guard assist, steadying assist  Assistive Device Front wheel walker ([place])  Distance Ambulated (ft) 250 ft  Mobility Ambulated independently in hallway  Mobility Response Tolerated well  Mobility performed by Mobility specialist  $Mobility charge 1 Mobility   Received pt in bed having no complaints and agreeable to mobility. Asymptomatic throughout ambulation, returned back to bed w/ call bell by side and all needs met.  Holland Falling Mobility Specialist Phone Number [phone]
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
## 2021-10-16 NOTE — Progress Notes (Signed)
PT Cancellation Note  Patient Details Name: Bridget Lane MRN: [id number] DOB: [birth year]   Cancelled Treatment:    Reason Eval/Treat Not Completed: Other (comment) Will hold on PT evaluation today. After hydrotherapy and OT session, pt very fatigued, painful, and tearful. Hydrotherapy note to follow. Will assess pt tomorrow.  Wyona Almas, PT, DPT Acute Rehabilitation Services Pager [phone] Office [phone]    [name] 10/16/2021, 3:06 PM

## 2021-10-16 NOTE — Progress Notes (Signed)
Pharmacy Antibiotic Note  Bridget Lane is a 87 y.o. female admitted on 10/14/2021 presenting with hand wound infection, failed PTA PO abx.  Pharmacy has been consulted for vancomycin dosing, 1000 mg given in ED.  She has been on vanc since 12/25. Still with discharge per MD. Wound culture grew out eikenella. This will not be covered by vanc. D/w Dr Tawanna Solo and we will change vanc to ceftriaxone. MD would like to also add flagyl. Advised MD to consult ID for LOT.   Plan: Dc vanc Ceftriaxone 2g IV q24 Flagyl 500mg  PO BID  Height: 5' 4.5" (163.8 cm) Weight: 68 kg (150 lb) IBW/kg (Calculated) : 55.85  Temp (24hrs), Avg:97.9 F (36.6 C), Min:97.8 F (36.6 C), Max:98 F (36.7 C)  Recent Labs  Lab 10/10/21 0136 10/14/21 1159 10/14/21 1825 10/15/21 0845 10/16/21 0353  WBC  --  9.3  --  9.9 10.0  CREATININE 1.04* 1.12*  --  0.97 0.94  LATICACIDVEN  --  1.0 0.8  --   --      Estimated Creatinine Clearance: 41.2 mL/min (by C-G formula based on SCr of 0.94 mg/dL).    Allergies  Allergen Reactions   Penicillins Rash   Codeine Other (See Comments)    hallucinations hallucinations   Codeine Other (See Comments)    nightmares   Dabigatran Etexilate Mesylate     Unk reaction   Dilaudid [Hydromorphone]     Rash with this in December 2022 hospitalization. hallucinations   Acetaminophen Other (See Comments)    hurts stomach hurts stomach   Dabigatran Etexilate Mesylate Other (See Comments)     All extremities feel heavy and hurt   Dabigatran Etexilate Mesylate Other (See Comments)     All extremities feel heavy and hurt   Penicillins Rash   Sulfa Antibiotics Rash   Sulfonamide Derivatives Rash   12/25 vanc>>12/26 12/27 ceftriaxone>> 12/27 flagyl>>   12/25 R wrist: EIKENELLA CORRODENS  12/25 BCx: ngtd   Onnie Boer, PharmD, BCIDP, AAHIVP, CPP Infectious Disease Pharmacist 10/16/2021 1:53 PM

## 2021-10-17 DIAGNOSIS — M009 Pyogenic arthritis, unspecified: Secondary | ICD-10-CM | POA: Diagnosis not present

## 2021-10-17 LAB — CBC WITH DIFFERENTIAL/PLATELET
Abs Immature Granulocytes: 0.12 10*3/uL — ABNORMAL HIGH (ref 0.00–0.07)
Basophils Absolute: 0.1 10*3/uL (ref 0.0–0.1)
Basophils Relative: 1 %
Eosinophils Absolute: 0.2 10*3/uL (ref 0.0–0.5)
Eosinophils Relative: 2 %
HCT: 27.4 % — ABNORMAL LOW (ref 36.0–46.0)
Hemoglobin: 8.9 g/dL — ABNORMAL LOW (ref 12.0–15.0)
Immature Granulocytes: 1 %
Lymphocytes Relative: 19 %
Lymphs Abs: 2 10*3/uL (ref 0.7–4.0)
MCH: 28.3 pg (ref 26.0–34.0)
MCHC: 32.5 g/dL (ref 30.0–36.0)
MCV: 87.3 fL (ref 80.0–100.0)
Monocytes Absolute: 0.9 10*3/uL (ref 0.1–1.0)
Monocytes Relative: 8 %
Neutro Abs: 7.2 10*3/uL (ref 1.7–7.7)
Neutrophils Relative %: 69 %
Platelets: 586 10*3/uL — ABNORMAL HIGH (ref 150–400)
RBC: 3.14 MIL/uL — ABNORMAL LOW (ref 3.87–5.11)
RDW: 13.9 % (ref 11.5–15.5)
WBC: 10.3 10*3/uL (ref 4.0–10.5)
nRBC: 0 % (ref 0.0–0.2)

## 2021-10-17 MED ORDER — DIPHENHYDRAMINE HCL 12.5 MG/5ML PO ELIX
12.5000 mg | ORAL_SOLUTION | Freq: Four times a day (QID) | ORAL | Status: DC | PRN
  Administered 2021-10-18: 03:00:00 12.5 mg via ORAL
  Filled 2021-10-17: qty 10
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
## 2021-10-17 NOTE — Progress Notes (Signed)
This was a follow up visit by request of patient for prayer.  Patient was sitting up and appeared to be doing fine.  Chaplain available as needed.  Jaclynn Major, [place], [facility], Pager [phone]

## 2021-10-17 NOTE — Progress Notes (Signed)
PHARMACIST - PHYSICIAN COMMUNICATION  DR:   Tawanna Solo  CONCERNING: IV to Oral Route Change Policy  RECOMMENDATION: This patient is receiving Benadryl by the intravenous route.  Based on criteria approved by the Pharmacy and Therapeutics Committee, the intravenous medication(s) is/are being converted to the equivalent oral dose form(s).   DESCRIPTION: These criteria include: The patient is eating (either orally or via tube) and/or has been taking other orally administered medications for a least 24 hours The patient has no evidence of active gastrointestinal bleeding or impaired GI absorption (gastrectomy, short bowel, patient on TNA or NPO).  If you have questions about this conversion, please contact the Pharmacy Department  []   [phone] )  [facility] []   [phone] )  [facility] [x]   [phone] )  [facility] []   [phone] )  [facility] []   [phone] )  [facility]
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
## 2021-10-18 DIAGNOSIS — M009 Pyogenic arthritis, unspecified: Secondary | ICD-10-CM | POA: Diagnosis not present

## 2021-10-18 LAB — AEROBIC CULTURE W GRAM STAIN (SUPERFICIAL SPECIMEN)

## 2021-10-18 NOTE — Progress Notes (Signed)
Patient ID: Bridget Lane, female   DOB: [birth year], 87 y.o.   MRN: [id number]   LOS: 4 days   Subjective: Tearful this morning but emotional, not pain. Wrist feels about the same.   Objective: Vital signs in last 24 hours: Temp:  [97.4 F (36.3 C)-97.8 F (36.6 C)] 97.5 F (36.4 C) (12/29 0807) Pulse Rate:  [61-84] 84 (12/29 0807) Resp:  [15-18] 16 (12/29 0807) BP: (113-149)/(49-76) 149/76 (12/29 0807) SpO2:  [98 %-100 %] 98 % (12/29 0807) Last BM Date: 10/15/21   Laboratory  CBC Recent Labs    10/16/21 0353 10/17/21 0225  WBC 10.0 10.3  HGB 8.6* 8.9*  HCT 26.7* 27.4*  PLT 574* 586*   BMET Recent Labs    10/16/21 0353  NA 135  K 4.2  CL 107  CO2 19*  GLUCOSE 78  BUN 8  CREATININE 0.94  CALCIUM 8.6*     Physical Exam General appearance: alert and no distress Right wrist -- Wound clean, granulation tissue present, wick removed. No indication of closing so left out and redressed.   Assessment/Plan: S/p I&D right wrist -- Continue dry dressing changes prn.    Lisette Abu, PA-C Orthopedic Surgery [phone] 10/18/2021

## 2021-10-18 NOTE — Plan of Care (Signed)
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
## 2021-10-18 NOTE — Progress Notes (Signed)
OT Cancellation Note  Patient Details Name: WANETTA FUNDERBURKE MRN: [id number] DOB: 01/07/1933   Cancelled Treatment:    Reason Eval/Treat Not Completed: Patient at procedure or test/ unavailable (pt having wound care performed 10-11am. OT to check back on pt as schedule allows)   Jefferey Pica, OTR/L [place] Pager: [phone] Office: [place] 10/18/2021, 11:10 AM
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
## 2021-10-18 NOTE — Care Management Important Message (Signed)
Important Message  Patient Details  Name: Bridget Lane MRN: [id number] Date of Birth: Jul 05, 1933   Medicare Important Message Given:  Yes     Hannah Beat 10/18/2021, 12:31 PM

## 2021-10-19 DIAGNOSIS — M009 Pyogenic arthritis, unspecified: Secondary | ICD-10-CM | POA: Diagnosis not present

## 2021-10-19 LAB — BASIC METABOLIC PANEL
Anion gap: 9 (ref 5–15)
BUN: 9 mg/dL (ref 8–23)
CO2: 24 mmol/L (ref 22–32)
Calcium: 8.9 mg/dL (ref 8.9–10.3)
Chloride: 105 mmol/L (ref 98–111)
Creatinine, Ser: 0.97 mg/dL (ref 0.44–1.00)
GFR, Estimated: 57 mL/min — ABNORMAL LOW (ref >60)
Glucose, Bld: 90 mg/dL (ref 70–99)
Potassium: 3.7 mmol/L (ref 3.5–5.1)
Sodium: 138 mmol/L (ref 135–145)

## 2021-10-19 LAB — CULTURE, BLOOD (ROUTINE X 2)
Culture: NO GROWTH
Culture: NO GROWTH
Special Requests: ADEQUATE
Special Requests: ADEQUATE

## 2021-10-19 LAB — MAGNESIUM: Magnesium: 1.8 mg/dL (ref 1.7–2.4)

## 2021-10-19 MED ORDER — GLUCERNA SHAKE PO LIQD
237.0000 mL | Freq: Two times a day (BID) | ORAL | Status: DC
  Administered 2021-10-19 – 2021-10-20 (×3): 237 mL via ORAL

## 2021-10-19 MED ORDER — SILVER NITRATE-POT NITRATE 75-25 % EX MISC
5.0000 | CUTANEOUS | Status: DC | PRN
  Filled 2021-10-19: qty 5

## 2021-10-19 MED ORDER — METRONIDAZOLE 500 MG/100ML IV SOLN
500.0000 mg | Freq: Two times a day (BID) | INTRAVENOUS | Status: DC
  Administered 2021-10-19 – 2021-10-20 (×3): 500 mg via INTRAVENOUS
  Filled 2021-10-19 (×3): qty 100

## 2021-10-19 MED ORDER — MORPHINE SULFATE (PF) 2 MG/ML IV SOLN
2.0000 mg | INTRAVENOUS | Status: DC | PRN
  Administered 2021-10-20: 2 mg via INTRAVENOUS
  Filled 2021-10-19: qty 1

## 2021-10-19 MED ORDER — MAGNESIUM OXIDE -MG SUPPLEMENT 400 (240 MG) MG PO TABS
400.0000 mg | ORAL_TABLET | Freq: Two times a day (BID) | ORAL | Status: DC
  Administered 2021-10-19 – 2021-10-20 (×3): 400 mg via ORAL
  Filled 2021-10-19 (×3): qty 1

## 2021-10-19 MED ORDER — MAGNESIUM SULFATE 2 GM/50ML IV SOLN
2.0000 g | Freq: Once | INTRAVENOUS | Status: AC
Start: 2021-10-19 — End: 2021-10-19
  Administered 2021-10-19: 15:00:00 2 g via INTRAVENOUS
  Filled 2021-10-19: qty 50

## 2021-10-19 MED ORDER — POTASSIUM CHLORIDE CRYS ER 20 MEQ PO TBCR
40.0000 meq | EXTENDED_RELEASE_TABLET | Freq: Once | ORAL | Status: AC
  Administered 2021-10-19: 15:00:00 40 meq via ORAL
  Filled 2021-10-19: qty 2

## 2021-10-19 NOTE — Plan of Care (Signed)

## 2021-10-19 NOTE — Progress Notes (Signed)
CCMD reported pt had Afib flutters no c/o pain or S/S BP 118/70,  HR 79, P 84  Temp 97.9, RR 16 Page MD G. Shalhoub by [employer].
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
## 2021-10-19 NOTE — Progress Notes (Signed)
Mobility Specialist Progress Note   10/19/21 1514  Mobility  Activity Refused mobility   Patient just finished working w/ OT and would like to rest until this evening. Will follow up later in the evening if time permits.   Holland Falling Mobility Specialist Phone Number [phone]
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
## 2021-10-20 LAB — BASIC METABOLIC PANEL
Anion gap: 12 (ref 5–15)
BUN: 11 mg/dL (ref 8–23)
CO2: 22 mmol/L (ref 22–32)
Calcium: 8.9 mg/dL (ref 8.9–10.3)
Chloride: 104 mmol/L (ref 98–111)
Creatinine, Ser: 1.02 mg/dL — ABNORMAL HIGH (ref 0.44–1.00)
GFR, Estimated: 54 mL/min — ABNORMAL LOW (ref >60)
Glucose, Bld: 97 mg/dL (ref 70–99)
Potassium: 4.3 mmol/L (ref 3.5–5.1)
Sodium: 138 mmol/L (ref 135–145)

## 2021-10-20 LAB — CBC
HCT: 29.9 % — ABNORMAL LOW (ref 36.0–46.0)
Hemoglobin: 9.4 g/dL — ABNORMAL LOW (ref 12.0–15.0)
MCH: 28 pg (ref 26.0–34.0)
MCHC: 31.4 g/dL (ref 30.0–36.0)
MCV: 89 fL (ref 80.0–100.0)
Platelets: 544 10*3/uL — ABNORMAL HIGH (ref 150–400)
RBC: 3.36 MIL/uL — ABNORMAL LOW (ref 3.87–5.11)
RDW: 14.1 % (ref 11.5–15.5)
WBC: 8.7 10*3/uL (ref 4.0–10.5)
nRBC: 0.2 % (ref 0.0–0.2)

## 2021-10-20 LAB — VITAMIN B12: Vitamin B-12: 842 pg/mL (ref 180–914)

## 2021-10-20 MED ORDER — CEFDINIR 300 MG PO CAPS: 300.0000 mg | ORAL_CAPSULE | Freq: Two times a day (BID) | ORAL | 0 refills | Status: AC

## 2021-10-20 MED ORDER — METOPROLOL TARTRATE 25 MG PO TABS: 25.0000 mg | ORAL_TABLET | Freq: Two times a day (BID) | ORAL | 0 refills | Status: DC

## 2021-10-20 MED ORDER — METRONIDAZOLE 500 MG PO TABS: 500.0000 mg | ORAL_TABLET | Freq: Three times a day (TID) | ORAL | 0 refills | Status: AC

## 2021-10-20 MED ORDER — HYDROCODONE-ACETAMINOPHEN 5-325 MG PO TABS: 1.0000 | ORAL_TABLET | Freq: Four times a day (QID) | ORAL | 0 refills | Status: DC | PRN

## 2021-10-20 MED ORDER — HYDROCODONE-ACETAMINOPHEN 5-325 MG PO TABS: 1.0000 | ORAL_TABLET | ORAL | Status: DC | PRN

## 2021-10-20 MED ORDER — MAGNESIUM OXIDE -MG SUPPLEMENT 400 (240 MG) MG PO TABS: 400.0000 mg | ORAL_TABLET | Freq: Two times a day (BID) | ORAL | 0 refills | Status: DC
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
## 2021-10-20 NOTE — Discharge Summary (Signed)
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 Bacteria STREPTOCOCCUS INTERMEDIUS  Final      Susceptibility   Streptococcus intermedius  - MIC*    PENICILLIN <=0.06 SENSITIVE Sensitive     CEFTRIAXONE <=0.12 SENSITIVE Sensitive     ERYTHROMYCIN >=8 RESISTANT Resistant     LEVOFLOXACIN 0.5 SENSITIVE Sensitive     VANCOMYCIN 0.5 SENSITIVE Sensitive     * RARE STREPTOCOCCUS INTERMEDIUS  Blood culture (routine x 2)     Status: None   Collection Time: 10/14/21  1:12 PM   Specimen: BLOOD RIGHT FOREARM  Result Value Ref Range Status   Specimen Description BLOOD RIGHT FOREARM  Final   Special Requests   Final    BOTTLES DRAWN AEROBIC AND ANAEROBIC Blood Culture adequate volume   Culture   Final    NO GROWTH 5 DAYS Performed at [facility], [address] [place], [place] [address]    Report Status 10/19/2021 FINAL  Final     Time coordinating discharge: 40 minutes  SIGNED:   Elmarie Shiley, MD  [facility]

## 2021-10-23 ENCOUNTER — Telehealth: Payer: Self-pay

## 2021-10-23 ENCOUNTER — Telehealth: Payer: Self-pay | Admitting: Family Medicine

## 2021-10-23 ENCOUNTER — Encounter: Payer: Self-pay | Admitting: Student

## 2021-10-23 NOTE — Telephone Encounter (Signed)
Beth with [facility] called she wants to get Nursing orders twice a week for 1 week and then once a week for 6 weeks. Today pt was orthostatic and they said pts BP was 128/80 for sitting and it was 98/80 while standing, her heart rate was 58 Call back [phone] and has secure voicemail

## 2021-10-23 NOTE — Telephone Encounter (Signed)
Appears on metoprolol 25 mg twice daily. Can reduce to 25 mg - take half tablet twice daily #90 with 3 refills. Place on bottle hold for heart rate under 55.   Remove any other metoprolol rx from her list

## 2021-10-23 NOTE — Telephone Encounter (Signed)
Returned call to [facility] regarding VO and she states it is for Wound care, medication and pain management teaching. Bridget Lane is concerned about her HR due to pt HR being as low as it was before taking her medications this morning, she states she saw on paperwork from the hospital to hold the Metoprolol with low HR but she is wanting to know if you have any clear peramiters as to when the metoprolol should be held.

## 2021-10-23 NOTE — Telephone Encounter (Signed)
Transition Care Management Unsuccessful Follow-up Telephone Call  Date of discharge and from where:  [place] 10/20/21  Attempts:  1st Attempt  Reason for unsuccessful TCM follow-up call:  Unable to leave message, pt has a voicemail that is not set up

## 2021-10-24 MED ORDER — METOPROLOL TARTRATE 25 MG PO TABS: 12.5000 mg | ORAL_TABLET | Freq: Two times a day (BID) | ORAL | 3 refills | Status: DC

## 2021-10-24 NOTE — Telephone Encounter (Signed)
Called and spoke with [facility] and gave below instructions, Rx sent to pharmacy.

## 2021-10-25 ENCOUNTER — Other Ambulatory Visit: Payer: Self-pay | Admitting: Family Medicine

## 2021-10-25 ENCOUNTER — Telehealth: Payer: Self-pay

## 2021-10-25 NOTE — Telephone Encounter (Signed)
..  Home Health Verbal Orders  Agency:   [facility]  Caller:  Alfonso Patten and title  Requesting OT/ PT/ Skilled nursing/ Social Work/ Speech:   PT  Reason for Request:  requesting verbal order after eval today   Frequency:  1 time for 1 week, 2 times for 4 weeks and 1 time for 2 weeks  Can leave message on VM.  HH needs F2F w/in last 30 days

## 2021-10-25 NOTE — Telephone Encounter (Signed)
Patient  is scheduled for 11/01/21 with Dr.Hunter

## 2021-10-25 NOTE — Telephone Encounter (Signed)
Please schedule pt [id number] for Home Health orders to be signed.

## 2021-10-26 ENCOUNTER — Telehealth: Payer: Self-pay | Admitting: Family Medicine

## 2021-10-26 NOTE — Telephone Encounter (Signed)
Bridget Lane with [facility] called stating that there has been a Delay in OT Evaluation for Pt. Pt's daughter declined today's evaluation. Timmothy Sours would like an OK to evaluate on next week. Please call Timmothy Sours at [phone].      [facility].

## 2021-10-26 NOTE — Telephone Encounter (Signed)
Called and lm on Don vm with VO.

## 2021-10-29 ENCOUNTER — Telehealth: Payer: Self-pay | Admitting: *Deleted

## 2021-10-29 NOTE — Telephone Encounter (Signed)
Called and left below message on Johnston's vm and advised tcb if any questions.

## 2021-10-29 NOTE — Telephone Encounter (Signed)
I think she needs to be triaged. Needs home covid test. May need to go back to hospital- blood pressure is so low could potentially be septic - has known potential source- infected wound

## 2021-10-29 NOTE — Telephone Encounter (Signed)
Bridget Lane. PT call in behalf on patient  Stated patient feeling weak in the past 2 days  Unable to walk from room to room as normal with  [facility] and Fatigue has appointment coming up on 01/12, requesting if is possible to be change sooner, today. As arrival   BP 92/60 HR 68 Sitting        BP 98/60 HR 65 After activities BP 90/56 Patient stated had medication changes  Please advise

## 2021-10-30 ENCOUNTER — Telehealth: Payer: Self-pay

## 2021-10-30 DIAGNOSIS — M25531 Pain in right wrist: Secondary | ICD-10-CM | POA: Diagnosis not present

## 2021-10-30 DIAGNOSIS — S61501A Unspecified open wound of right wrist, initial encounter: Secondary | ICD-10-CM | POA: Diagnosis not present

## 2021-10-30 NOTE — Telephone Encounter (Signed)
Bridget Lane called back and just wanted to leave a message, He said pts daughter declined to bring pt to emergency room and she stated pt seeing wound care doctor today.  Callback for Mr Bridget Lane if needed [phone]

## 2021-10-30 NOTE — Telephone Encounter (Signed)
I still think she needs triage- this does not appear to have been addressed. Please check in with them about this and also see if they can give [place] a status update after wound doctor

## 2021-10-30 NOTE — Telephone Encounter (Signed)
Went to see patient today.    States therapy had called yesterday.  States patients BP was low and did not go to ER.  Overnight patient was complaining of SOB and chest pain.    Family did call EMS.  EKG in home completed.  Heart rate was 96.  Patient was in atrial fib.  States Yong Channel wanted patient to hold metoprolol if heart rate was under 55.  States it sounds like family is just holding metoprolol without taking heart rate.  States that daughter states they have held metoprolol for several days.  Does not know if there is a depression component or if holding the metoprolol is causing patient to feel worse.    Wanted to advise provider for upcoming appt.

## 2021-10-31 ENCOUNTER — Telehealth: Payer: Self-pay | Admitting: Neurology

## 2021-10-31 ENCOUNTER — Telehealth: Payer: Self-pay | Admitting: Family Medicine

## 2021-10-31 NOTE — Telephone Encounter (Signed)
Thanks for heads up-we will check in tomorrow-if she begins to feel worse still would recommend emergency room

## 2021-10-31 NOTE — Telephone Encounter (Signed)
Nadara Eaton calling from [facility] trying to get verbal orders for patient, OT for 5 total visits over 5 weeks for ADL, IADL, Transfers, Exercise, and Pain control. Call back [phone]

## 2021-10-31 NOTE — Telephone Encounter (Signed)
Called and lm for pt tcb. 
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
## 2021-10-31 NOTE — Telephone Encounter (Signed)
FYI, appt with [place] tomorrow.

## 2021-10-31 NOTE — Telephone Encounter (Signed)
Called and lm on Bridget Lane's confidential vm with VO and tcb if any questions.

## 2021-11-01 ENCOUNTER — Other Ambulatory Visit: Payer: Self-pay

## 2021-11-01 ENCOUNTER — Encounter: Payer: Self-pay | Admitting: Family Medicine

## 2021-11-01 ENCOUNTER — Ambulatory Visit (INDEPENDENT_AMBULATORY_CARE_PROVIDER_SITE_OTHER): Payer: Medicare Other | Admitting: Family Medicine

## 2021-11-01 VITALS — BP 120/62 | HR 66 | Temp 97.9°F | Ht 64.5 in | Wt 141.4 lb

## 2021-11-01 DIAGNOSIS — I480 Paroxysmal atrial fibrillation: Secondary | ICD-10-CM

## 2021-11-01 DIAGNOSIS — G250 Essential tremor: Secondary | ICD-10-CM | POA: Diagnosis not present

## 2021-11-01 DIAGNOSIS — M109 Gout, unspecified: Secondary | ICD-10-CM

## 2021-11-01 DIAGNOSIS — E785 Hyperlipidemia, unspecified: Secondary | ICD-10-CM

## 2021-11-01 DIAGNOSIS — Z79899 Other long term (current) drug therapy: Secondary | ICD-10-CM

## 2021-11-01 DIAGNOSIS — F3341 Major depressive disorder, recurrent, in partial remission: Secondary | ICD-10-CM

## 2021-11-01 LAB — COMPREHENSIVE METABOLIC PANEL
ALT: 14 U/L (ref 0–35)
AST: 21 U/L (ref 0–37)
Albumin: 3.7 g/dL (ref 3.5–5.2)
Alkaline Phosphatase: 70 U/L (ref 39–117)
BUN: 20 mg/dL (ref 6–23)
CO2: 27 mEq/L (ref 19–32)
Calcium: 9.3 mg/dL (ref 8.4–10.5)
Chloride: 103 mEq/L (ref 96–112)
Creatinine, Ser: 1.28 mg/dL — ABNORMAL HIGH (ref 0.40–1.20)
GFR: 37.93 mL/min — ABNORMAL LOW (ref >60.00)
Glucose, Bld: 96 mg/dL (ref 70–99)
Potassium: 4.8 mEq/L (ref 3.5–5.1)
Sodium: 138 mEq/L (ref 135–145)
Total Bilirubin: 0.4 mg/dL (ref 0.2–1.2)
Total Protein: 6.9 g/dL (ref 6.0–8.3)

## 2021-11-01 LAB — MAGNESIUM: Magnesium: 2.1 mg/dL (ref 1.5–2.5)

## 2021-11-01 LAB — CBC WITH DIFFERENTIAL/PLATELET
Basophils Absolute: 0.1 10*3/uL (ref 0.0–0.1)
Basophils Relative: 1.3 % (ref 0.0–3.0)
Eosinophils Absolute: 0.2 10*3/uL (ref 0.0–0.7)
Eosinophils Relative: 3.7 % (ref 0.0–5.0)
HCT: 33.2 % — ABNORMAL LOW (ref 36.0–46.0)
Hemoglobin: 10.6 g/dL — ABNORMAL LOW (ref 12.0–15.0)
Lymphocytes Relative: 22.8 % (ref 12.0–46.0)
Lymphs Abs: 1.2 10*3/uL (ref 0.7–4.0)
MCHC: 32 g/dL (ref 30.0–36.0)
MCV: 86.6 fl (ref 78.0–100.0)
Monocytes Absolute: 0.5 10*3/uL (ref 0.1–1.0)
Monocytes Relative: 9.4 % (ref 3.0–12.0)
Neutro Abs: 3.3 10*3/uL (ref 1.4–7.7)
Neutrophils Relative %: 62.8 % (ref 43.0–77.0)
Platelets: 356 10*3/uL (ref 150.0–400.0)
RBC: 3.83 Mil/uL — ABNORMAL LOW (ref 3.87–5.11)
RDW: 15.5 % (ref 11.5–15.5)
WBC: 5.2 10*3/uL (ref 4.0–10.5)

## 2021-11-01 MED ORDER — FLECAINIDE ACETATE 100 MG PO TABS: 100.0000 mg | ORAL_TABLET | Freq: Two times a day (BID) | ORAL | 3 refills | Status: DC

## 2021-11-01 MED ORDER — BUSPIRONE HCL 5 MG PO TABS: 5.0000 mg | ORAL_TABLET | Freq: Two times a day (BID) | ORAL | 5 refills | Status: DC | PRN
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
## 2021-11-01 NOTE — Patient Instructions (Addendum)
Health Maintenance Due  Topic Date Due   Zoster Vaccines- Shingrix (1 of 2) -Please check with your pharmacy to see if they have the shingrix vaccine. If they do- please get this immunization and update [place] by phone call or mychart with dates you receive the vaccine.  Never done   Please stop by lab before you go If you have mychart- we will send your results within 3 business days of [place] receiving them.  If you do not have mychart- we will call you about results within 5 business days of [place] receiving them.  *please also note that you will see labs on mychart as soon as they post. I will later go in and write notes on them- will say "notes from Dr. Yong Channel"  Please keep your neurology visit in March.  Trial buspirone 5 mg twice daily as needed for anxiety including before bed- I am concerned on stronger options like lorazepam about falls  I am glad to see that you are gradually improving overall!  Recommended follow up: 3 month follow up or sooner if needed

## 2021-11-05 ENCOUNTER — Other Ambulatory Visit: Payer: Self-pay

## 2021-11-05 ENCOUNTER — Ambulatory Visit: Payer: Self-pay

## 2021-11-05 ENCOUNTER — Ambulatory Visit: Payer: Medicare Other | Admitting: Family Medicine

## 2021-11-05 ENCOUNTER — Ambulatory Visit (INDEPENDENT_AMBULATORY_CARE_PROVIDER_SITE_OTHER): Payer: Medicare Other

## 2021-11-05 VITALS — BP 112/68 | HR 63 | Ht 64.5 in | Wt 144.2 lb

## 2021-11-05 DIAGNOSIS — G8929 Other chronic pain: Secondary | ICD-10-CM | POA: Diagnosis not present

## 2021-11-05 DIAGNOSIS — M25562 Pain in left knee: Secondary | ICD-10-CM

## 2021-11-05 DIAGNOSIS — M25561 Pain in right knee: Secondary | ICD-10-CM | POA: Diagnosis not present

## 2021-11-05 DIAGNOSIS — M1711 Unilateral primary osteoarthritis, right knee: Secondary | ICD-10-CM

## 2021-11-05 NOTE — Telephone Encounter (Signed)
Thanks- that's helpful feedback  Patient had been referred in October 2022 to neurology  for hallucinations and dementia with behavioral disturbance by the hospital team- can referral coordinator use that? If not my team can reenter referral under me/pcp.

## 2021-11-05 NOTE — Patient Instructions (Addendum)
Thank you for coming in today.   Please get an Xray today before you leave   You received steroid injections in both of your knees today. Seek immediate medical attention if the joint becomes red, extremely painful, or is oozing fluid.   I can repeat the steroid shots every 3 month if needed  Recheck back as needed
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
## 2021-11-05 NOTE — Telephone Encounter (Signed)
Big thank you to both of you!

## 2021-11-06 DIAGNOSIS — S61501D Unspecified open wound of right wrist, subsequent encounter: Secondary | ICD-10-CM | POA: Diagnosis not present

## 2021-11-06 NOTE — Progress Notes (Signed)
Right knee x-ray shows arthritis changes especially in the lateral compartment of the knee.

## 2021-11-06 NOTE — Progress Notes (Signed)
Left knee x-ray shows an old fracture that has healed of the fibula and the leg.  This occurred at least multiple months probably years ago.  I do not think it is causing her pain at all.  The knee itself looks okay without a lot of arthritis.

## 2021-11-12 DIAGNOSIS — N1831 Chronic kidney disease, stage 3a: Secondary | ICD-10-CM | POA: Diagnosis not present

## 2021-11-12 DIAGNOSIS — S61511A Laceration without foreign body of right wrist, initial encounter: Secondary | ICD-10-CM | POA: Diagnosis not present

## 2021-11-12 DIAGNOSIS — E440 Moderate protein-calorie malnutrition: Secondary | ICD-10-CM | POA: Diagnosis not present

## 2021-11-12 DIAGNOSIS — M65141 Other infective (teno)synovitis, right hand: Secondary | ICD-10-CM | POA: Diagnosis not present

## 2021-11-12 DIAGNOSIS — F32A Depression, unspecified: Secondary | ICD-10-CM | POA: Diagnosis not present

## 2021-11-12 DIAGNOSIS — M47812 Spondylosis without myelopathy or radiculopathy, cervical region: Secondary | ICD-10-CM | POA: Diagnosis not present

## 2021-11-12 DIAGNOSIS — B954 Other streptococcus as the cause of diseases classified elsewhere: Secondary | ICD-10-CM | POA: Diagnosis not present

## 2021-11-12 DIAGNOSIS — S065X1D Traumatic subdural hemorrhage with loss of consciousness of 30 minutes or less, subsequent encounter: Secondary | ICD-10-CM | POA: Diagnosis not present

## 2021-11-12 DIAGNOSIS — D631 Anemia in chronic kidney disease: Secondary | ICD-10-CM | POA: Diagnosis not present

## 2021-11-12 DIAGNOSIS — E785 Hyperlipidemia, unspecified: Secondary | ICD-10-CM | POA: Diagnosis not present

## 2021-11-12 DIAGNOSIS — M48061 Spinal stenosis, lumbar region without neurogenic claudication: Secondary | ICD-10-CM | POA: Diagnosis not present

## 2021-11-12 DIAGNOSIS — K219 Gastro-esophageal reflux disease without esophagitis: Secondary | ICD-10-CM | POA: Diagnosis not present

## 2021-11-12 DIAGNOSIS — B9689 Other specified bacterial agents as the cause of diseases classified elsewhere: Secondary | ICD-10-CM | POA: Diagnosis not present

## 2021-11-12 DIAGNOSIS — I129 Hypertensive chronic kidney disease with stage 1 through stage 4 chronic kidney disease, or unspecified chronic kidney disease: Secondary | ICD-10-CM | POA: Diagnosis not present

## 2021-11-12 DIAGNOSIS — S025XXD Fracture of tooth (traumatic), subsequent encounter for fracture with routine healing: Secondary | ICD-10-CM | POA: Diagnosis not present

## 2021-11-12 DIAGNOSIS — F0283 Dementia in other diseases classified elsewhere, unspecified severity, with mood disturbance: Secondary | ICD-10-CM | POA: Diagnosis not present

## 2021-11-22 ENCOUNTER — Other Ambulatory Visit: Payer: Self-pay | Admitting: Cardiovascular Disease

## 2021-11-22 NOTE — Telephone Encounter (Signed)
Eliquis 5mg  refill request received. Patient is 88 years old, weight-65.4kg, Crea-1.28 on 11/01/2021, Diagnosis-Afib, and last seen by Dr. Acie Fredrickson on 05/21/2021. Dose is appropriate based on dosing criteria. However, Eliquis is on hold per PCP, on 11/01/2021 his note states "Anticoagulated  IN PAST with Eliquis 5 mg BID. We are holding off for now to make sure she does not have falls before restarting and giving time out from subdural" Will not send since there is no follow up note for pt restarting eliquis.

## 2021-12-03 ENCOUNTER — Other Ambulatory Visit: Payer: Self-pay

## 2021-12-03 ENCOUNTER — Encounter: Payer: Self-pay | Admitting: Neurology

## 2021-12-03 ENCOUNTER — Ambulatory Visit: Payer: Medicare Other | Admitting: Neurology

## 2021-12-03 VITALS — BP 119/60 | HR 67 | Ht 64.0 in | Wt 145.0 lb

## 2021-12-03 DIAGNOSIS — F3341 Major depressive disorder, recurrent, in partial remission: Secondary | ICD-10-CM

## 2021-12-03 DIAGNOSIS — G3184 Mild cognitive impairment, so stated: Secondary | ICD-10-CM

## 2021-12-03 NOTE — Patient Instructions (Addendum)
Continue current medications  Follow up with PCP for referral to therapist regarding her current marital separation  Follow up in a year    There are well-accepted and sensible ways to reduce risk for Alzheimers disease and other degenerative brain disorders .  Exercise Daily Walk A daily 20 minute walk should be part of your routine. Disease related apathy can be a significant roadblock to exercise and the only way to overcome this is to make it a daily routine and perhaps have a reward at the end (something your loved one loves to eat or drink perhaps) or a personal trainer coming to the home can also be very useful. Most importantly, the patient is much more likely to exercise if the caregiver / spouse does it with him/her. In general a structured, repetitive schedule is best.  General Health: Any diseases which effect your body will effect your brain such as a pneumonia, urinary infection, blood clot, heart attack or stroke. Keep contact with your primary care doctor for regular follow ups.  Sleep. A good nights sleep is healthy for the brain. Seven hours is recommended. If you have insomnia or poor sleep habits we can give you some instructions. If you have sleep apnea wear your mask.  Diet: Eating a heart healthy diet is also a good idea; fish and poultry instead of red meat, nuts (mostly non-peanuts), vegetables, fruits, olive oil or canola oil (instead of butter), minimal salt (use other spices to flavor foods), whole grain rice, bread, cereal and pasta and wine in moderation.Research is now showing that the MIND diet, which is a combination of The Mediterranean diet and the DASH diet, is beneficial for cognitive processing and longevity. Information about this diet can be found in The MIND Diet, a book by Doyne Keel, MS, RDN, and online at [employer]book[employer].si  Finances, Power of Attorney and Advance Directives: You should consider putting legal safeguards in place  with regard to financial and medical decision making. While the spouse always has power of attorney for medical and financial issues in the absence of any form, you should consider what you want in case the spouse / caregiver is no longer around or capable of making decisions.     Heart-head connection  New research shows there are things we can do to reduce the risk of mild cognitive impairment and dementia.  Several conditions known to increase the risk of cardiovascular disease -- such as high blood pressure, diabetes and high cholesterol -- also increase the risk of developing Alzheimer's. Some autopsy studies show that as many as 11 percent of individuals with Alzheimer's disease also have cardiovascular disease.  A longstanding question is why some people develop hallmark Alzheimer's plaques and tangles but do not develop the symptoms of Alzheimer's. Vascular disease may help researchers eventually find an answer. Some autopsy studies suggest that plaques and tangles may be present in the brain without causing symptoms of cognitive decline unless the brain also shows evidence of vascular disease. More research is needed to better understand the link between vascular health and Alzheimer's.  Physical exercise and diet Regular physical exercise may be a beneficial strategy to lower the risk of Alzheimer's and vascular dementia. Exercise may directly benefit brain cells by increasing blood and oxygen flow in the brain. Because of its known cardiovascular benefits, a medically approved exercise program is a valuable part of any overall wellness plan.  Current evidence suggests that heart-healthy eating may also help protect the brain. Heart-healthy eating includes limiting  the intake of sugar and saturated fats and making sure to eat plenty of fruits, vegetables, and whole grains. No one diet is best. Two diets that have been studied and may be beneficial are the DASH (Dietary Approaches to Stop  Hypertension) diet and the Mediterranean diet. The DASH diet emphasizes vegetables, fruits and fat-free or low-fat dairy products; includes whole grains, fish, poultry, beans, seeds, nuts and vegetable oils; and limits sodium, sweets, sugary beverages and red meats. A Mediterranean diet includes relatively little red meat and emphasizes whole grains, fruits and vegetables, fish and shellfish, and nuts, olive oil and other healthy fats.  Social connections and intellectual activity A number of studies indicate that maintaining strong social connections and keeping mentally active as we age might lower the risk of cognitive decline and Alzheimer's. Experts are not certain about the reason for this association. It may be due to direct mechanisms through which social and mental stimulation strengthen connections between nerve cells in the brain.  Head trauma There appears to be a strong link between future risk of Alzheimer's and serious head trauma, especially when injury involves loss of consciousness. You can help reduce your risk of Alzheimer's by protecting your head.  Wear a seat belt  Use a helmet when participating in sports  "Fall-proof" your home   What you can do now While research is not yet conclusive, certain lifestyle choices, such as physical activity and diet, may help support brain health and prevent Alzheimer's. Many of these lifestyle changes have been shown to lower the risk of other diseases, like heart disease and diabetes, which have been linked to Alzheimer's. With few drawbacks and plenty of known benefits, healthy lifestyle choices can improve your health and possibly protect your brain.  Learn more about brain health. You can help increase our knowledge by considering participation in a clinical study. Our free clinical trial matching services, Trial[employer], can help you find clinical trials in your area that are seeking volunteers.
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
## 2021-12-04 DIAGNOSIS — L089 Local infection of the skin and subcutaneous tissue, unspecified: Secondary | ICD-10-CM | POA: Diagnosis not present

## 2021-12-04 NOTE — Addendum Note (Signed)
Addended by: [name] on: 12/04/2021 08:24 PM   Modules accepted: Orders

## 2021-12-17 ENCOUNTER — Ambulatory Visit: Payer: Medicare Other | Admitting: Neurology

## 2021-12-20 ENCOUNTER — Other Ambulatory Visit: Payer: Self-pay | Admitting: Family Medicine

## 2021-12-26 ENCOUNTER — Encounter: Payer: Medicare Other | Admitting: Neurology

## 2021-12-26 NOTE — Progress Notes (Signed)
error    This encounter was created in error - please disregard.
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
## 2022-01-03 ENCOUNTER — Ambulatory Visit: Payer: Medicare Other | Admitting: Nurse Practitioner

## 2022-01-03 ENCOUNTER — Other Ambulatory Visit: Payer: Self-pay

## 2022-01-03 ENCOUNTER — Encounter: Payer: Self-pay | Admitting: Nurse Practitioner

## 2022-01-03 VITALS — BP 120/74 | HR 53 | Ht 64.0 in | Wt 152.0 lb

## 2022-01-03 DIAGNOSIS — I480 Paroxysmal atrial fibrillation: Secondary | ICD-10-CM

## 2022-01-03 DIAGNOSIS — Z7901 Long term (current) use of anticoagulants: Secondary | ICD-10-CM | POA: Diagnosis not present

## 2022-01-03 DIAGNOSIS — I2510 Atherosclerotic heart disease of native coronary artery without angina pectoris: Secondary | ICD-10-CM

## 2022-01-03 DIAGNOSIS — I10 Essential (primary) hypertension: Secondary | ICD-10-CM

## 2022-01-03 DIAGNOSIS — I700 Atherosclerosis of aorta: Secondary | ICD-10-CM

## 2022-01-03 DIAGNOSIS — E785 Hyperlipidemia, unspecified: Secondary | ICD-10-CM | POA: Diagnosis not present

## 2022-01-03 NOTE — Patient Instructions (Signed)
Medication Instructions:  ?Your physician recommends that you continue on your current medications as directed. Please refer to the Current Medication list given to you today. ? ?*If you need a refill on your cardiac medications before your next appointment, please call your pharmacy* ? ? ?Lab Work: ?NONE ?If you have labs (blood work) drawn today and your tests are completely normal, you will receive your results only by: ?MyChart Message (if you have MyChart) OR ?A paper copy in the mail ?If you have any lab test that is abnormal or we need to change your treatment, we will call you to review the results. ? ? ?Testing/Procedures: ?NONE ? ? ?Follow-Up: ?At [facility], you and your health needs are our priority.  As part of our continuing mission to provide you with exceptional heart care, we have created designated Provider Care Teams.  These Care Teams include your primary Cardiologist (physician) and Advanced Practice Providers (APPs -  Physician Assistants and Nurse Practitioners) who all work together to provide you with the care you need, when you need it.  ? ?Your next appointment:   ?6 month(s) ? ?The format for your next appointment:   ?In Person ? ?Provider:   ?Mertie Moores, MD   ? ? ?Other Instructions ?Please monitor Blood pressure at home-check once daily and let us know if top number (systolic) is consistently greater than 140 or lower than 110. ?Please also monitor heart rate and report if consistently less than 55 or greater than 90 beats per minute  ?  ?

## 2022-01-16 ENCOUNTER — Other Ambulatory Visit: Payer: Self-pay | Admitting: Family Medicine

## 2022-01-17 DIAGNOSIS — S0181XD Laceration without foreign body of other part of head, subsequent encounter: Secondary | ICD-10-CM | POA: Diagnosis not present

## 2022-02-07 ENCOUNTER — Ambulatory Visit (INDEPENDENT_AMBULATORY_CARE_PROVIDER_SITE_OTHER): Payer: Medicare Other | Admitting: Family Medicine

## 2022-02-07 ENCOUNTER — Encounter: Payer: Self-pay | Admitting: Family Medicine

## 2022-02-07 VITALS — BP 120/74 | HR 74 | Temp 98.4°F | Ht 64.0 in | Wt 149.6 lb

## 2022-02-07 DIAGNOSIS — I480 Paroxysmal atrial fibrillation: Secondary | ICD-10-CM | POA: Diagnosis not present

## 2022-02-07 DIAGNOSIS — I700 Atherosclerosis of aorta: Secondary | ICD-10-CM

## 2022-02-07 DIAGNOSIS — E785 Hyperlipidemia, unspecified: Secondary | ICD-10-CM

## 2022-02-07 DIAGNOSIS — D6869 Other thrombophilia: Secondary | ICD-10-CM

## 2022-02-07 DIAGNOSIS — F3341 Major depressive disorder, recurrent, in partial remission: Secondary | ICD-10-CM | POA: Diagnosis not present

## 2022-02-07 NOTE — Patient Instructions (Addendum)
Please check with your pharmacy to see if they have the shingrix vaccine. If they do- please get this immunization and update [place] by phone call or mychart with dates you receive the vaccine ? ?Please stop by lab before you go ?If you have mychart- we will send your results within 3 business days of [place] receiving them.  ?If you do not have mychart- we will call you about results within 5 business days of [place] receiving them.  ?*please also note that you will see labs on mychart as soon as they post. I will later go in and write notes on them- will say "notes from Dr. Yong Channel"  ? ? ?Recommended follow up: Return in about 6 months (around 08/09/2022) for physical or sooner if needed.Schedule b4 you leave. ? ? ?

## 2022-02-08 LAB — COMPREHENSIVE METABOLIC PANEL
ALT: 23 U/L (ref 0–35)
AST: 25 U/L (ref 0–37)
Albumin: 4.2 g/dL (ref 3.5–5.2)
Alkaline Phosphatase: 74 U/L (ref 39–117)
BUN: 29 mg/dL — ABNORMAL HIGH (ref 6–23)
CO2: 24 mEq/L (ref 19–32)
Calcium: 9.5 mg/dL (ref 8.4–10.5)
Chloride: 106 mEq/L (ref 96–112)
Creatinine, Ser: 1.17 mg/dL (ref 0.40–1.20)
GFR: 42.17 mL/min — ABNORMAL LOW (ref >60.00)
Glucose, Bld: 86 mg/dL (ref 70–99)
Potassium: 4.8 mEq/L (ref 3.5–5.1)
Sodium: 141 mEq/L (ref 135–145)
Total Bilirubin: 0.6 mg/dL (ref 0.2–1.2)
Total Protein: 7.1 g/dL (ref 6.0–8.3)

## 2022-02-08 LAB — CBC WITH DIFFERENTIAL/PLATELET
Basophils Absolute: 0.1 10*3/uL (ref 0.0–0.1)
Basophils Relative: 1.1 % (ref 0.0–3.0)
Eosinophils Absolute: 0.1 10*3/uL (ref 0.0–0.7)
Eosinophils Relative: 1.4 % (ref 0.0–5.0)
HCT: 36.7 % (ref 36.0–46.0)
Hemoglobin: 12 g/dL (ref 12.0–15.0)
Lymphocytes Relative: 23.3 % (ref 12.0–46.0)
Lymphs Abs: 1.9 10*3/uL (ref 0.7–4.0)
MCHC: 32.5 g/dL (ref 30.0–36.0)
MCV: 88 fl (ref 78.0–100.0)
Monocytes Absolute: 0.6 10*3/uL (ref 0.1–1.0)
Monocytes Relative: 7.6 % (ref 3.0–12.0)
Neutro Abs: 5.3 10*3/uL (ref 1.4–7.7)
Neutrophils Relative %: 66.6 % (ref 43.0–77.0)
Platelets: 317 10*3/uL (ref 150.0–400.0)
RBC: 4.17 Mil/uL (ref 3.87–5.11)
RDW: 15.3 % (ref 11.5–15.5)
WBC: 8 10*3/uL (ref 4.0–10.5)

## 2022-02-08 LAB — LIPID PANEL
Cholesterol: 172 mg/dL (ref 0–200)
HDL: 67.8 mg/dL (ref >39.00)
LDL Cholesterol: 72 mg/dL (ref 0–99)
NonHDL: 103.96
Total CHOL/HDL Ratio: 3
Triglycerides: 158 mg/dL — ABNORMAL HIGH (ref 0.0–149.0)
VLDL: 31.6 mg/dL (ref 0.0–40.0)

## 2022-02-12 ENCOUNTER — Other Ambulatory Visit: Payer: Self-pay | Admitting: Family Medicine

## 2022-03-13 ENCOUNTER — Ambulatory Visit: Payer: Self-pay

## 2022-03-13 ENCOUNTER — Ambulatory Visit (INDEPENDENT_AMBULATORY_CARE_PROVIDER_SITE_OTHER): Payer: Medicare Other

## 2022-03-13 ENCOUNTER — Ambulatory Visit: Payer: Medicare Other | Admitting: Family Medicine

## 2022-03-13 VITALS — BP 110/78 | HR 77 | Ht 64.0 in | Wt 151.0 lb

## 2022-03-13 DIAGNOSIS — S0990XA Unspecified injury of head, initial encounter: Secondary | ICD-10-CM

## 2022-03-13 DIAGNOSIS — M25562 Pain in left knee: Secondary | ICD-10-CM | POA: Diagnosis not present

## 2022-03-13 DIAGNOSIS — M25561 Pain in right knee: Secondary | ICD-10-CM

## 2022-03-13 DIAGNOSIS — G8929 Other chronic pain: Secondary | ICD-10-CM | POA: Diagnosis not present
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
## 2022-03-13 NOTE — Patient Instructions (Addendum)
Good to see you   Please get an Xray today before you leave   You should hear from CT scheduling within 1 week. If you do not hear please let me know.    Recheck in 1 month.

## 2022-03-14 DIAGNOSIS — H43813 Vitreous degeneration, bilateral: Secondary | ICD-10-CM | POA: Diagnosis not present

## 2022-03-14 DIAGNOSIS — H353124 Nonexudative age-related macular degeneration, left eye, advanced atrophic with subfoveal involvement: Secondary | ICD-10-CM | POA: Diagnosis not present

## 2022-03-14 DIAGNOSIS — H43393 Other vitreous opacities, bilateral: Secondary | ICD-10-CM | POA: Diagnosis not present

## 2022-03-14 DIAGNOSIS — H353112 Nonexudative age-related macular degeneration, right eye, intermediate dry stage: Secondary | ICD-10-CM | POA: Diagnosis not present

## 2022-03-14 NOTE — Progress Notes (Signed)
Right knee x-ray shows severe arthritis changes.

## 2022-03-21 ENCOUNTER — Telehealth: Payer: Self-pay

## 2022-03-21 ENCOUNTER — Ambulatory Visit (HOSPITAL_BASED_OUTPATIENT_CLINIC_OR_DEPARTMENT_OTHER): Payer: Medicare Other

## 2022-03-21 NOTE — Telephone Encounter (Signed)
Called and spoke to pt to inform her of approval for bilat Monovisc. Pt noted she just had steroid shots so she would wait until her knees started hurting again and would call back to get scheduled.

## 2022-03-25 NOTE — Addendum Note (Signed)
Addended by: Pollyann Glen on: 03/25/2022 03:45 PM   Modules accepted: Orders

## 2022-04-05 ENCOUNTER — Ambulatory Visit
Admission: RE | Admit: 2022-04-05 | Discharge: 2022-04-05 | Disposition: A | Discharge status: Home / Self Care | Payer: Medicare Other | Source: Ambulatory Visit | Attending: Family Medicine | Admitting: Family Medicine

## 2022-04-05 DIAGNOSIS — S0990XA Unspecified injury of head, initial encounter: Secondary | ICD-10-CM | POA: Diagnosis not present

## 2022-04-05 DIAGNOSIS — R55 Syncope and collapse: Secondary | ICD-10-CM | POA: Diagnosis not present

## 2022-04-16 ENCOUNTER — Ambulatory Visit: Payer: Medicare Other | Admitting: Family Medicine

## 2022-04-16 ENCOUNTER — Ambulatory Visit: Payer: Self-pay

## 2022-04-16 VITALS — BP 122/76 | HR 53 | Ht 64.0 in | Wt 150.4 lb

## 2022-04-16 DIAGNOSIS — G8929 Other chronic pain: Secondary | ICD-10-CM | POA: Diagnosis not present

## 2022-04-16 DIAGNOSIS — M25561 Pain in right knee: Secondary | ICD-10-CM

## 2022-04-16 DIAGNOSIS — R296 Repeated falls: Secondary | ICD-10-CM | POA: Diagnosis not present

## 2022-04-16 DIAGNOSIS — M25562 Pain in left knee: Secondary | ICD-10-CM

## 2022-04-16 DIAGNOSIS — M1711 Unilateral primary osteoarthritis, right knee: Secondary | ICD-10-CM

## 2022-04-16 NOTE — Progress Notes (Signed)
I, Philbert Riser, LAT, ATC acting as a scribe for Bridget Graham, MD.  Bridget Lane is a 88 y.o. female who presents to [facility] today for f/u bilat knee pain and head injury w/ CT scan review. Pt was previously seen by Dr. Denyse Amass on 03/13/22 for chronic bilat knee pain and was given bilat knee steroid injection and was advised we work work on approval for hyaluronic acid injections and a head CT was ordered. Today, pt reports increased pain in her R knee esp w/ lateral movements. Pt reports suffering 2-3 falls since her last visit. Pt has started to use a cane more, however left it in the car today.   She notes that some of the falls she is not sure of the fall. She thinks she may have passed out.   R knee swelling: yes Mechanical symptoms: yes  Dx imaging: 04/05/22 Head CT  03/13/22 R knee XR 11/05/21 R & L knee XR 07/19/20 R & L knee XR  Pertinent review of systems: no fever or chills  Relevant historical information: Osteoporosis and frequent falls.    Exam:  BP 122/76   Pulse (!) 53   Ht 5\' 4"  (1.626 m)   Wt 150 lb 6.4 oz (68.2 kg)   SpO2 97%   BMI 25.82 kg/m  General: Well Developed, well nourished, and in no acute distress.   MSK: Right knee: Genu valgus.  Moderate effusion normal motion with crepitation.  Tender palpation medial and lateral joint line.  Stable ligamentous exam.  L-spine: Normal lumbar motion. Lower extremity strength is intact. Reflexes are intact.  Neuropsych: Alert and oriented.  Slight unbalanced gait.     Lab and Radiology Results  Procedure: Real-time Ultrasound Guided Injection of right knee superior lateral patellar space Device: Philips Affiniti 50G Images permanently stored and available for review in PACS Verbal informed consent obtained.  Discussed risks and benefits of procedure. Warned about infection, bleeding, damage to structures among others. Patient expresses understanding and  agreement Time-out conducted.   Noted no overlying erythema, induration, or other signs of local infection.   Skin prepped in a sterile fashion.   Local anesthesia: Topical Ethyl chloride.   With sterile technique and under real time ultrasound guidance: Monovisc 4 mL injected into knee joint. Fluid seen entering the joint capsule.   Completed without difficulty   Advised to call if fevers/chills, erythema, induration, drainage, or persistent bleeding.   Images permanently stored and available for review in the ultrasound unit.  Impression: Technically successful ultrasound guided injection. Lot number: [id number]   EXAM: CT HEAD WITHOUT CONTRAST   TECHNIQUE: Contiguous axial images were obtained from the base of the skull through the vertex without intravenous contrast.   RADIATION DOSE REDUCTION: This exam was performed according to the departmental dose-optimization program which includes automated exposure control, adjustment of the mA and/or kV according to patient size and/or use of iterative reconstruction technique.   COMPARISON:  CT head 10/06/2021   BRAIN: BRAIN Patchy and confluent areas of decreased attenuation are noted throughout the deep and periventricular white matter of the cerebral hemispheres bilaterally, compatible with chronic microvascular ischemic disease.   No evidence of large-territorial acute infarction. No parenchymal hemorrhage. No mass lesion. No extra-axial collection.   No mass effect or midline shift. No hydrocephalus. Basilar cisterns are patent.   Vascular: No hyperdense vessel.   Skull: No acute fracture or focal lesion.   Sinuses/Orbits: Paranasal sinuses and mastoid air cells  are clear. Bilateral lens replacement. The orbits are unremarkable.   Other: None.   IMPRESSION: No acute intracranial abnormality.     Electronically Signed   By: Tish Frederickson M.D.   On: 04/05/2022 23:42   EXAM: RIGHT KNEE 3 VIEWS   COMPARISON:   Right knee radiographs 11/05/2021   FINDINGS: Severe lateral compartment joint space narrowing with bone-on-bone contact and large peripheral degenerative osteophytes. Moderate peripheral patellar degenerative osteophytosis. Mild patellofemoral joint space narrowing. Moderate peripheral lateral trochlear degenerative osteophytosis.   No acute fracture or dislocation.   On lateral view there is a small joint effusion with tiny focus of air within the nondependent aspect of the joint fluid. Recommend clinical correlation for possible recent injection accounting for this air.   IMPRESSION: 1. Severe lateral and mild-to-moderate patellofemoral compartment osteoarthritis. 2. Small joint effusion. There is a small focus of nondependent air within the suprapatellar joint space. Note is made of ultrasound-guided bilateral knee injections documented on 03/13/2022 1:23 p.m. ultrasound on PACS. The current radiographs were performed 03/13/2022 at 1:43 p.m. This air is presumably related to the recent right knee injection. Recommend clinical correlation.     Electronically Signed   By: Neita Garnet M.D.   On: 03/14/2022 10:54    I, Bridget Lane, personally (independently) visualized and performed the interpretation of the images attached in this note.     Assessment and Plan: 88 y.o. female with right knee pain due to exacerbation of DJD.  He has severe DJD on recent knee x-ray.  She has failed typical conservative management including steroid injection.  Plan for facet injection today.  We will proceed to Monovisc injection.  On a more larger picture are her more recent falls.  She has had escalating falls.  Etiology of her falls are unclear.  I think probably multifactorial.  She would I think benefit from a cardiology evaluation as some of her falls may be syncope related.  Will discuss with her primary care provider.  Strongly encouraged use of a walker for fall prevention.  Check  back with me as needed.   PDMP not reviewed this encounter. Orders Placed This Encounter  Procedures   [place] LIMITED JOINT SPACE STRUCTURES LOW BILAT(NO LINKED CHARGES)    Order Specific Question:   Reason for Exam (SYMPTOM  OR DIAGNOSIS REQUIRED)    Answer:   bilateral knee apin    Order Specific Question:   Preferred imaging location?    Answer:   [facility]   No orders of the defined types were placed in this encounter.    Discussed warning signs or symptoms. Please see discharge instructions. Patient expresses understanding.   The above documentation has been reviewed and is accurate and complete Bridget Lane, M.D.

## 2022-04-26 ENCOUNTER — Inpatient Hospital Stay: Payer: Medicare Other | Admitting: Hematology and Oncology
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
## 2022-05-15 ENCOUNTER — Inpatient Hospital Stay: Payer: Medicare Other | Attending: Hematology and Oncology | Admitting: Hematology and Oncology

## 2022-05-15 ENCOUNTER — Other Ambulatory Visit: Payer: Self-pay

## 2022-05-15 DIAGNOSIS — Z170 Estrogen receptor positive status [ER+]: Secondary | ICD-10-CM | POA: Diagnosis not present

## 2022-05-15 DIAGNOSIS — Z79811 Long term (current) use of aromatase inhibitors: Secondary | ICD-10-CM | POA: Diagnosis not present

## 2022-05-15 DIAGNOSIS — Z880 Allergy status to penicillin: Secondary | ICD-10-CM | POA: Insufficient documentation

## 2022-05-15 DIAGNOSIS — Z79899 Other long term (current) drug therapy: Secondary | ICD-10-CM | POA: Diagnosis not present

## 2022-05-15 DIAGNOSIS — M2560 Stiffness of unspecified joint, not elsewhere classified: Secondary | ICD-10-CM | POA: Insufficient documentation

## 2022-05-15 DIAGNOSIS — Z882 Allergy status to sulfonamides status: Secondary | ICD-10-CM | POA: Diagnosis not present

## 2022-05-15 DIAGNOSIS — M8580 Other specified disorders of bone density and structure, unspecified site: Secondary | ICD-10-CM | POA: Insufficient documentation

## 2022-05-15 DIAGNOSIS — C50512 Malignant neoplasm of lower-outer quadrant of left female breast: Secondary | ICD-10-CM | POA: Insufficient documentation

## 2022-05-15 DIAGNOSIS — Z885 Allergy status to narcotic agent status: Secondary | ICD-10-CM | POA: Diagnosis not present
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
## 2022-05-21 ENCOUNTER — Other Ambulatory Visit: Payer: Self-pay | Admitting: Family Medicine

## 2022-05-25 ENCOUNTER — Other Ambulatory Visit: Payer: Self-pay | Admitting: Family Medicine

## 2022-06-18 ENCOUNTER — Other Ambulatory Visit: Payer: Self-pay

## 2022-06-18 DIAGNOSIS — I480 Paroxysmal atrial fibrillation: Secondary | ICD-10-CM

## 2022-06-18 NOTE — Patient Outreach (Signed)
  Care Coordination   Initial Visit Note   06/18/2022 Name: TRUDA STAUB MRN: [id number] DOB: 1933-10-08  LILYONNA STEIDLE is a 88 y.o. year old female who sees Yong Channel, Brayton Mars, MD for primary care. I spoke with  [name] by phone today.  What matters to the patients health and wellness today?  I am doing good but I would like transportation if my daughter can not take me     Goals Addressed             This Visit's Progress    COMPLETED: Care Coordination Activities - no follow up required       Care Coordination Interventions: Advised patient to call to schedule Annual Wellness Visit Provided education to patient re: transportation resources, Annual Wellness Visit, care coordination services Care Guide referral for transportation resources to use if daughter is not available  Assessed social determinant of health barriers          SDOH assessments and interventions completed:  Yes  SDOH Interventions Today    Flowsheet Row Most Recent Value  SDOH Interventions   Food Insecurity Interventions Intervention Not Indicated  Housing Interventions Intervention Not Indicated  Transportation Interventions Intervention Not Indicated  [referral to care guide to get Access [place]]        Care Coordination Interventions Activated:  Yes  Care Coordination Interventions:  Yes, provided   Follow up plan: No further intervention required. Referral made to Care Guide for transportation    Encounter Outcome:  Pt. Visit Completed  Peter Garter RN, [facility], CDE Care Management Coordinator [place] [facility] [phone]

## 2022-06-18 NOTE — Patient Instructions (Signed)
Visit Information  Thank you for taking time to visit with me today. Please don't hesitate to contact me if I can be of assistance to you.   Following are the goals we discussed today:   Goals Addressed             This Visit's Progress    COMPLETED: Care Coordination Activities - no follow up required       Care Coordination Interventions: Advised patient to call to schedule Annual Wellness Visit Provided education to patient re: transportation resources, Annual Wellness Visit, care coordination services Care Guide referral for transportation resources to use if daughter is not available  Assessed social determinant of health barriers            If you are experiencing a Mental Health or [place] or need someone to talk to, please call the Suicide and Crisis Lifeline: 988 call the [place] National Suicide Prevention Lifeline: (714) 540-7409 or TTY: 209-030-6396 TTY 769-507-1957) to talk to a trained counselor call 1-800-273-TALK (toll free, 24 hour hotline) go to [facility] [place] ([phone]) call 911   Patient verbalizes understanding of instructions and care plan provided today and agrees to view in [place]. Active MyChart status and patient understanding of how to access instructions and care plan via MyChart confirmed with patient.     No further follow up required:    Peter Garter RN, Jackquline Denmark, [place] [facility] [phone]

## 2022-06-19 ENCOUNTER — Telehealth: Payer: Self-pay

## 2022-06-19 NOTE — Telephone Encounter (Signed)
   Telephone encounter was:  Successful.  06/19/2022 Name: Bridget Lane MRN: [id number] DOB: 07/02/1933  [name] is a 88 y.o. year old female who is a primary care patient of Yong Channel, Brayton Mars, MD . The community resource team was consulted for assistance with Transportation Needs   Care guide performed the following interventions: Patient provided with information about care guide support team and interviewed to confirm resource needs. Patient advised she is only needing the resource information at this time. CG will send requested information to the address on file. Pt has been advised: I have mailed the following information and if she has not received the information in 7 to 14 days or have additional questions to please call me back at [phone]. Patient understood. Mail Enclosed: -[employer]) -[place] -IT sales professional -[place] for Transportation  Follow Up Plan:  No further follow up planned at this time. The patient has been provided with needed resources.  [place] management  [place], [place]  Main Phone: [phone]  E-mail: [email or web address]  Website: [email or web address]

## 2022-07-08 ENCOUNTER — Encounter: Payer: Self-pay | Admitting: Cardiovascular Disease
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
## 2022-07-09 ENCOUNTER — Ambulatory Visit: Payer: Medicare Other | Attending: Cardiovascular Disease | Admitting: Cardiovascular Disease

## 2022-07-09 ENCOUNTER — Encounter: Payer: Self-pay | Admitting: Cardiovascular Disease

## 2022-07-09 VITALS — BP 122/58 | HR 55 | Ht 64.5 in | Wt 148.4 lb

## 2022-07-09 DIAGNOSIS — E785 Hyperlipidemia, unspecified: Secondary | ICD-10-CM

## 2022-07-09 DIAGNOSIS — I2510 Atherosclerotic heart disease of native coronary artery without angina pectoris: Secondary | ICD-10-CM | POA: Diagnosis not present

## 2022-07-09 DIAGNOSIS — I480 Paroxysmal atrial fibrillation: Secondary | ICD-10-CM | POA: Diagnosis not present

## 2022-07-09 DIAGNOSIS — R296 Repeated falls: Secondary | ICD-10-CM

## 2022-07-09 NOTE — Patient Instructions (Signed)
Medication Instructions:  NO CHANGES *If you need a refill on your cardiac medications before your next appointment, please call your pharmacy*   Lab Work: NONE If you have labs (blood work) drawn today and your tests are completely normal, you will receive your results only by: MyChart Message (if you have MyChart) OR A paper copy in the mail If you have any lab test that is abnormal or we need to change your treatment, we will call you to review the results.   Testing/Procedures: NONE   Follow-Up: At [facility], you and your health needs are our priority.  As part of our continuing mission to provide you with exceptional heart care, we have created designated Provider Care Teams.  These Care Teams include your primary Cardiologist (physician) and Advanced Practice Providers (APPs -  Physician Assistants and Nurse Practitioners) who all work together to provide you with the care you need, when you need it.  We recommend signing up for the patient portal called "MyChart".  Sign up information is provided on this After Visit Summary.  MyChart is used to connect with patients for Virtual Visits (Telemedicine).  Patients are able to view lab/test results, encounter notes, upcoming appointments, etc.  Non-urgent messages can be sent to your provider as well.   To learn more about what you can do with MyChart, go to [email or web address].    Your next appointment:   1 year(s)  The format for your next appointment:   In Person  Provider:   Philip [name], MD     Other Instructions NONE  Important Information About Sugar       

## 2022-07-12 ENCOUNTER — Telehealth: Payer: Self-pay | Admitting: *Deleted

## 2022-07-12 ENCOUNTER — Other Ambulatory Visit: Payer: Self-pay | Admitting: *Deleted

## 2022-07-12 DIAGNOSIS — Z170 Estrogen receptor positive status [ER+]: Secondary | ICD-10-CM

## 2022-07-12 IMAGING — DX DG WRIST COMPLETE 3+V*R*
4 series · 4 of 4 positions shown · non-contrast
Comparison: October 01, 2021 com August 15, 2021

CLINICAL DATA: r wrist infection

EXAM:
RIGHT WRIST - COMPLETE 3+ VIEW

[x wrist pa right]
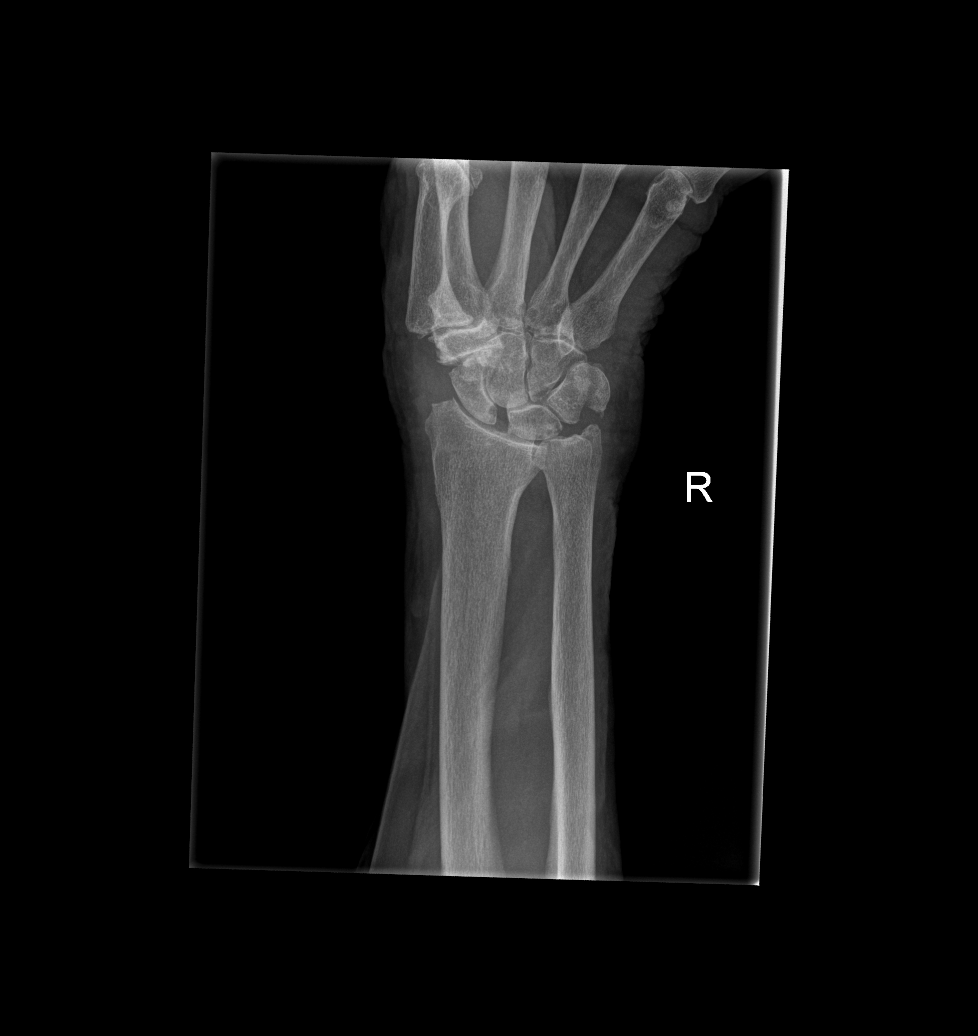

[x wrist obl right]
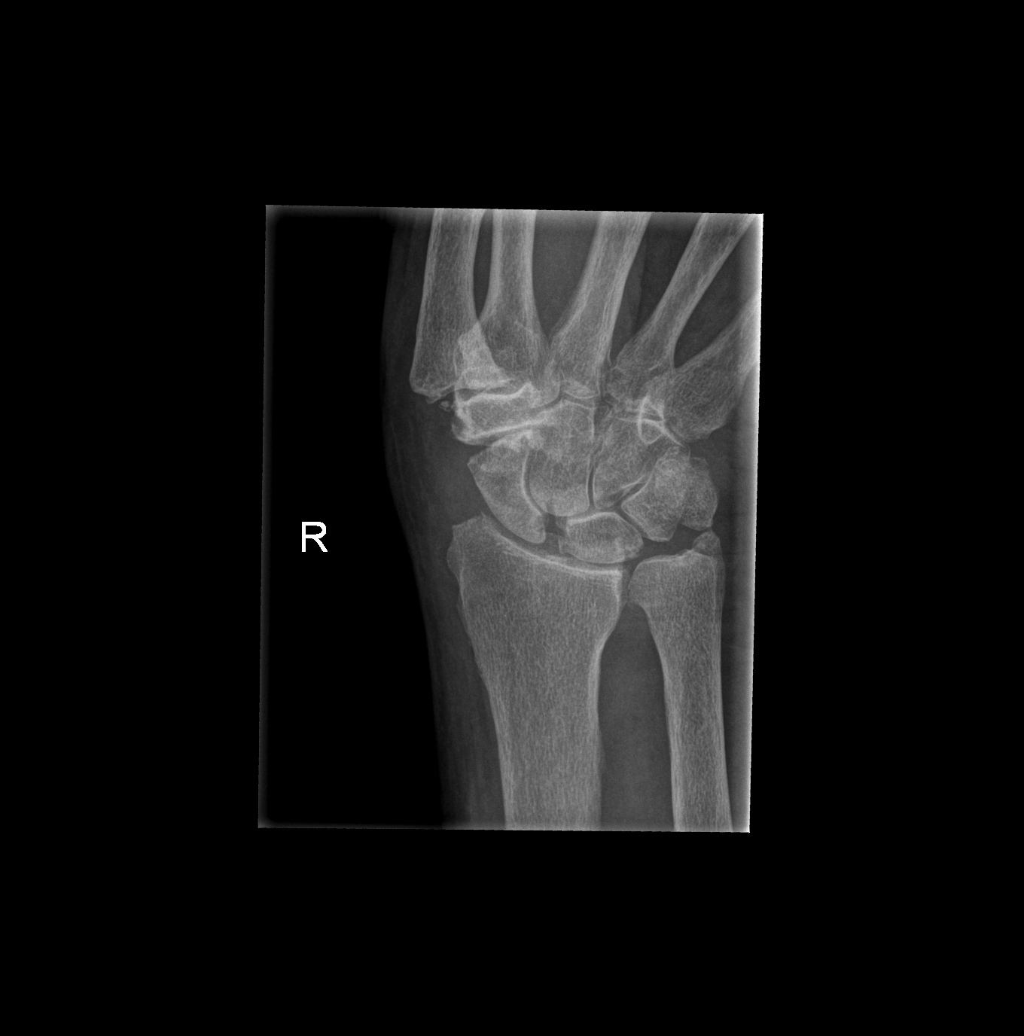

[x wrist lat right]
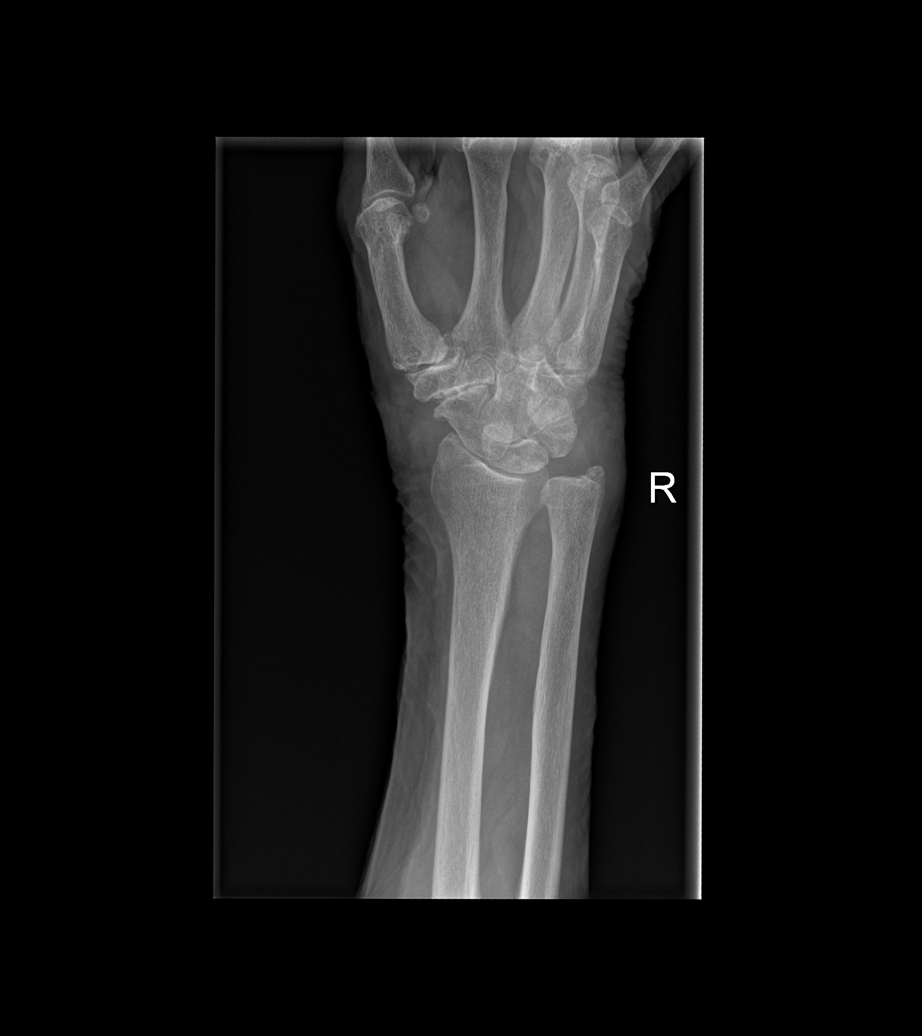

[x wrist navicular view right]
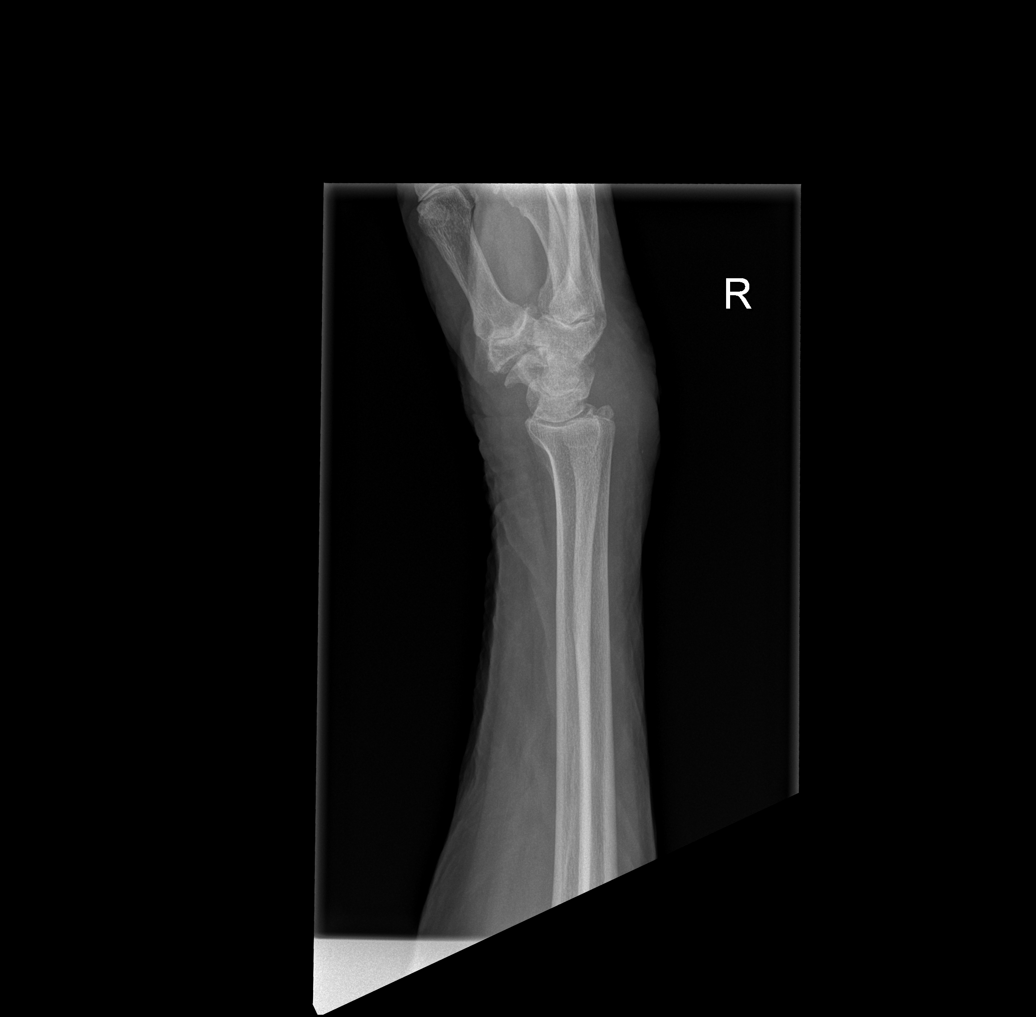

[4 of 4 positions shown; findings below may reference images not displayed]

FINDINGS: There is widening of the scapholunate interval, likely reflecting
ligamentous injury. Advanced degenerative changes of the first CMC.
Similar appearance of subcortical lucencies of the lunate, ulna and
scaphoid dating back to July 2021, most consistent with
degenerative changes. Faint periosteal reaction adjacent to the
distal aspect of the pisiform on single view, likely reflecting
sequela of nondisplaced fracture. Osteopenia. Soft tissue edema
overlying the dorsum of the wrist. No soft tissue air or unexpected
radiopaque foreign body.
IMPRESSION: 1. No definitive radiographic evidence of osteomyelitis. Faint
periosteal reaction adjacent to the pisiform is favored to reflect
healing nondisplaced fracture.
2. Widening of the scapholunate interval consistent with
age-indeterminate scapholunate ligament disruption.

## 2022-07-12 NOTE — Telephone Encounter (Signed)
Pt daughter called stating pt had c/o pain in left breast and also mm had not been ordered for this year. This nurse was able to schedule appt for 9/26 at 2pm. Pt daughter called and made aware of appt and time. Pt daughter verbalized understanding.

## 2022-07-15 ENCOUNTER — Ambulatory Visit: Payer: Medicare Other

## 2022-07-15 ENCOUNTER — Encounter: Payer: Self-pay | Admitting: *Deleted

## 2022-07-15 ENCOUNTER — Ambulatory Visit
Admission: RE | Admit: 2022-07-15 | Discharge: 2022-07-15 | Disposition: A | Discharge status: Home / Self Care | Payer: Medicare Other | Source: Ambulatory Visit | Attending: Hematology and Oncology | Admitting: Hematology and Oncology

## 2022-07-15 DIAGNOSIS — N644 Mastodynia: Secondary | ICD-10-CM | POA: Diagnosis not present

## 2022-07-15 DIAGNOSIS — Z170 Estrogen receptor positive status [ER+]: Secondary | ICD-10-CM

## 2022-07-17 ENCOUNTER — Ambulatory Visit: Payer: Self-pay

## 2022-07-17 ENCOUNTER — Ambulatory Visit: Payer: Medicare Other | Admitting: Family Medicine

## 2022-07-17 VITALS — BP 112/72 | Ht 64.5 in | Wt 147.0 lb

## 2022-07-17 DIAGNOSIS — M5416 Radiculopathy, lumbar region: Secondary | ICD-10-CM

## 2022-07-17 DIAGNOSIS — M25561 Pain in right knee: Secondary | ICD-10-CM

## 2022-07-17 DIAGNOSIS — G8929 Other chronic pain: Secondary | ICD-10-CM | POA: Diagnosis not present

## 2022-07-17 MED ORDER — HYALURONAN 88 MG/4ML IX SOSY
88.0000 mg | PREFILLED_SYRINGE | Freq: Once | INTRA_ARTICULAR | Status: AC
  Administered 2022-07-17: 88 mg via INTRA_ARTICULAR

## 2022-07-17 NOTE — Patient Instructions (Signed)
Thank you for coming in today.   Call or go to the ER if you develop a large red swollen joint with extreme pain or oozing puss.    Please call  Imaging at [phone] to schedule your spine injection.    If not better we can get you set up with a surgeon for your knee

## 2022-07-17 NOTE — Progress Notes (Signed)
I, Peterson Lombard, LAT, ATC acting as a scribe for [name], MD.  Bridget Lane is a 88 y.o. female who presents to [facility] today for low back/buttock pain. Pt was previously seen by Dr. Georgina Snell on 04/16/22 for chronic bilat knee pain. Today, pt c/o low back pain w/ radiating pain into both legs. Pt locates pain to all over the R knee and distally into her lower leg. Pt reports Dr. Yong Channel told her this were several disc bulging in her lower back.  Low back pain: no Radiating pain: no LE numbness/tingling: no LE weakness: yes Aggravates: varies daily Treatments tried: IBU  Dx imaging: 05/05/20 L-spine MRI  06/18/19 L-spine XR   10/30/18 L hip XR  Pertinent review of systems: No fevers or chills  Relevant historical information: A-fib not on current anticoagulation.  Right knee DJD.   Exam:  BP 112/72   Ht 5' 4.5" (1.638 m)   Wt 147 lb (66.7 kg)   BMI 24.84 kg/m  General: Well Developed, well nourished, and in no acute distress.   MSK: Right knee moderate effusion and genu valgus deformity.  Decreased range of motion.  Lumbar spine decreased lumbar motion.    Lab and Radiology Results  Monovisc injection right knee Procedure: Real-time Ultrasound Guided Injection of superior lateral patellar space Device: Philips Affiniti 50G Images permanently stored and available for review in PACS Verbal informed consent obtained.  Discussed risks and benefits of procedure. Warned about infection, bleeding, damage to structures among others. Patient expresses understanding and agreement Time-out conducted.   Noted no overlying erythema, induration, or other signs of local infection.   Skin prepped in a sterile fashion.   Local anesthesia: Topical Ethyl chloride.   With sterile technique and under real time ultrasound guidance: Monovisc 4 mL injected into right knee. Fluid seen entering the joint capsule.   Completed without difficulty   Advised  to call if fevers/chills, erythema, induration, drainage, or persistent bleeding.   Images permanently stored and available for review in the ultrasound unit.  Impression: Technically successful ultrasound guided injection.  Lot number: [id number]  EXAM: MRI LUMBAR SPINE WITHOUT CONTRAST   TECHNIQUE: Multiplanar, multisequence MR imaging of the lumbar spine was performed. No intravenous contrast was administered.   COMPARISON:  X-ray 06/18/2019   FINDINGS: Segmentation: 5 lumbar type vertebral segments with hypoplastic ribs at the T12 segment. The inferior-most well developed disc space is designated as L5-S1.   Alignment: 9 mm grade 2 anterolisthesis L4 on L5. Trace retrolisthesis of T12 on L1.   Vertebrae:  No fracture, evidence of discitis, or bone lesion.   Conus medullaris and cauda equina: Conus extends to the L1 level. Conus and cauda equina appear normal.   Paraspinal and other soft tissues: Susceptibility artifact within the pelvis slightly degrading evaluation of the L5-S1 level. Mildly dilated common bile duct measuring 12 mm in diameter, which may be related to post cholecystectomy changes.   Disc levels:   T12-L1: Slight retrolisthesis. No evidence of foraminal or canal stenosis.   L1-L2: No significant disc protrusion, foraminal stenosis, or canal stenosis.   L2-L3: No significant disc protrusion, foraminal stenosis, or canal stenosis.   L3-L4: Minimal diffuse disc bulge and mild bilateral facet arthropathy without foraminal or canal stenosis.   L4-L5: Grade 2 anterolisthesis with disc uncovering and moderate bilateral facet arthropathy resulting in severe canal stenosis (series 7, image 26) with moderate bilateral foraminal stenosis.   L5-S1: Mild diffuse disc bulge  and moderate bilateral facet arthropathy without foraminal or canal stenosis.   IMPRESSION: 1. Grade 2 anterolisthesis of L4 on L5 with severe canal stenosis and moderate bilateral  foraminal stenosis at L4-L5. 2. No additional levels of significant foraminal or canal stenosis. 3. Mildly dilated common bile duct measuring 12 mm in diameter, which may be related to post cholecystectomy changes.     Electronically Signed   By: Davina Poke D.O.   On: 05/08/2020 15:38   I, [name], personally (independently) visualized and performed the interpretation of the images attached in this note.    Assessment and Plan: 88 y.o. female with multifactorial right leg pain.  Dominant finding is right knee pain due to DJD.  She has severe DJD on prior x-ray and is failing typical conservative management.  We will try Monovisc injection today.  If this is not sufficient and her history of having steroid injection that did not work very well I would recommend neck step should be orthopedic surgery referral.    She has some more distal right lateral lower leg pain thought to be perhaps L5 lumbar radiculopathy.  This matches a lumbar MRI from 2021.  Plan for epidural steroid injection   PDMP not reviewed this encounter. Orders Placed This Encounter  Procedures   [place] LIMITED JOINT SPACE STRUCTURES LOW RIGHT(NO LINKED CHARGES)    Order Specific Question:   Reason for Exam (SYMPTOM  OR DIAGNOSIS REQUIRED)    Answer:   rt knee    Order Specific Question:   Preferred imaging location?    Answer:   [facility]   DG INJECT DIAG/THERA/INC NEEDLE/CATH/PLC EPI/LUMB/SAC W/IMG    Standing Status:   Future    Standing Expiration Date:   07/18/2023    Order Specific Question:   Reason for Exam (SYMPTOM  OR DIAGNOSIS REQUIRED)    Answer:   Rt L5 symptoms. Level and technique per radiology    Order Specific Question:   Preferred Imaging Location?    Answer:   [facility]    Order Specific Question:   Radiology Contrast Protocol - do NOT remove file path    Answer:   [email or web address]   No orders of the defined types  were placed in this encounter.    Discussed warning signs or symptoms. Please see discharge instructions. Patient expresses understanding.   The above documentation has been reviewed and is accurate and complete [name], M.D.

## 2022-07-22 ENCOUNTER — Other Ambulatory Visit: Payer: Self-pay

## 2022-07-22 MED ORDER — FLECAINIDE ACETATE 100 MG PO TABS: 100.0000 mg | ORAL_TABLET | Freq: Two times a day (BID) | ORAL | 3 refills | Status: DC

## 2022-07-24 ENCOUNTER — Ambulatory Visit
Admission: RE | Admit: 2022-07-24 | Discharge: 2022-07-24 | Disposition: A | Discharge status: Home / Self Care | Payer: Medicare Other | Source: Ambulatory Visit | Attending: Family Medicine | Admitting: Family Medicine

## 2022-07-24 DIAGNOSIS — M5416 Radiculopathy, lumbar region: Secondary | ICD-10-CM

## 2022-07-24 DIAGNOSIS — M4316 Spondylolisthesis, lumbar region: Secondary | ICD-10-CM | POA: Diagnosis not present

## 2022-07-24 MED ORDER — METHYLPREDNISOLONE ACETATE 40 MG/ML INJ SUSP (RADIOLOG
80.0000 mg | Freq: Once | INTRAMUSCULAR | Status: AC
  Administered 2022-07-24: 80 mg via EPIDURAL

## 2022-07-24 MED ORDER — IOPAMIDOL (ISOVUE-M 200) INJECTION 41%
1.0000 mL | Freq: Once | INTRAMUSCULAR | Status: AC
  Administered 2022-07-24: 1 mL via EPIDURAL

## 2022-07-24 NOTE — Discharge Instructions (Signed)

## 2022-08-18 ENCOUNTER — Other Ambulatory Visit: Payer: Self-pay | Admitting: Family Medicine

## 2022-08-19 ENCOUNTER — Other Ambulatory Visit: Payer: Self-pay

## 2022-08-19 ENCOUNTER — Encounter (HOSPITAL_COMMUNITY): Payer: Self-pay

## 2022-08-19 ENCOUNTER — Emergency Department (HOSPITAL_COMMUNITY)
Admission: EM | Admit: 2022-08-19 | Discharge: 2022-08-19 | Disposition: A | Discharge status: Home / Self Care | Payer: Medicare Other | Attending: Emergency Medicine | Admitting: Emergency Medicine

## 2022-08-19 ENCOUNTER — Emergency Department (HOSPITAL_COMMUNITY): Payer: Medicare Other

## 2022-08-19 DIAGNOSIS — I10 Essential (primary) hypertension: Secondary | ICD-10-CM | POA: Diagnosis not present

## 2022-08-19 DIAGNOSIS — W19XXXA Unspecified fall, initial encounter: Secondary | ICD-10-CM

## 2022-08-19 DIAGNOSIS — Z79899 Other long term (current) drug therapy: Secondary | ICD-10-CM | POA: Diagnosis not present

## 2022-08-19 DIAGNOSIS — S0101XA Laceration without foreign body of scalp, initial encounter: Secondary | ICD-10-CM | POA: Diagnosis not present

## 2022-08-19 DIAGNOSIS — Z043 Encounter for examination and observation following other accident: Secondary | ICD-10-CM | POA: Diagnosis not present

## 2022-08-19 DIAGNOSIS — M4312 Spondylolisthesis, cervical region: Secondary | ICD-10-CM | POA: Diagnosis not present

## 2022-08-19 DIAGNOSIS — R5383 Other fatigue: Secondary | ICD-10-CM | POA: Insufficient documentation

## 2022-08-19 DIAGNOSIS — S0990XA Unspecified injury of head, initial encounter: Secondary | ICD-10-CM | POA: Diagnosis not present

## 2022-08-19 LAB — CBC WITH DIFFERENTIAL/PLATELET
Abs Immature Granulocytes: 0.04 10*3/uL (ref 0.00–0.07)
Basophils Absolute: 0.1 10*3/uL (ref 0.0–0.1)
Basophils Relative: 1 %
Eosinophils Absolute: 0.1 10*3/uL (ref 0.0–0.5)
Eosinophils Relative: 2 %
HCT: 32.9 % — ABNORMAL LOW (ref 36.0–46.0)
Hemoglobin: 10.6 g/dL — ABNORMAL LOW (ref 12.0–15.0)
Immature Granulocytes: 1 %
Lymphocytes Relative: 28 %
Lymphs Abs: 2.1 10*3/uL (ref 0.7–4.0)
MCH: 30.1 pg (ref 26.0–34.0)
MCHC: 32.2 g/dL (ref 30.0–36.0)
MCV: 93.5 fL (ref 80.0–100.0)
Monocytes Absolute: 0.5 10*3/uL (ref 0.1–1.0)
Monocytes Relative: 7 %
Neutro Abs: 4.6 10*3/uL (ref 1.7–7.7)
Neutrophils Relative %: 61 %
Platelets: 233 10*3/uL (ref 150–400)
RBC: 3.52 MIL/uL — ABNORMAL LOW (ref 3.87–5.11)
RDW: 13.2 % (ref 11.5–15.5)
WBC: 7.4 10*3/uL (ref 4.0–10.5)
nRBC: 0 % (ref 0.0–0.2)

## 2022-08-19 LAB — URINALYSIS, ROUTINE W REFLEX MICROSCOPIC
Bilirubin Urine: NEGATIVE
Glucose, UA: NEGATIVE mg/dL
Hgb urine dipstick: NEGATIVE
Ketones, ur: NEGATIVE mg/dL
Nitrite: NEGATIVE
Protein, ur: NEGATIVE mg/dL
Specific Gravity, Urine: 1.01 (ref 1.005–1.030)
pH: 5.5 (ref 5.0–8.0)

## 2022-08-19 LAB — BASIC METABOLIC PANEL
Anion gap: 5 (ref 5–15)
BUN: 36 mg/dL — ABNORMAL HIGH (ref 8–23)
CO2: 20 mmol/L — ABNORMAL LOW (ref 22–32)
Calcium: 8.9 mg/dL (ref 8.9–10.3)
Chloride: 112 mmol/L — ABNORMAL HIGH (ref 98–111)
Creatinine, Ser: 1.49 mg/dL — ABNORMAL HIGH (ref 0.44–1.00)
GFR, Estimated: 34 mL/min — ABNORMAL LOW (ref >60)
Glucose, Bld: 99 mg/dL (ref 70–99)
Potassium: 4.7 mmol/L (ref 3.5–5.1)
Sodium: 137 mmol/L (ref 135–145)

## 2022-08-19 LAB — URINALYSIS, MICROSCOPIC (REFLEX)

## 2022-08-19 MED ORDER — CEPHALEXIN 250 MG PO CAPS: 250.0000 mg | ORAL_CAPSULE | Freq: Two times a day (BID) | ORAL | 0 refills | Status: DC

## 2022-08-19 MED ORDER — BACITRACIN ZINC 500 UNIT/GM EX OINT
TOPICAL_OINTMENT | Freq: Two times a day (BID) | CUTANEOUS | Status: DC
  Administered 2022-08-19: 1 via TOPICAL
  Filled 2022-08-19: qty 0.9

## 2022-08-19 MED ORDER — LIDOCAINE-EPINEPHRINE 2 %-1:100000 IJ SOLN
20.0000 mL | Freq: Once | INTRAMUSCULAR | Status: AC
  Administered 2022-08-19: 20 mL
  Filled 2022-08-19: qty 1

## 2022-08-19 NOTE — ED Triage Notes (Signed)
Fall at home, hit her head on the door frame. Has a full thickness gash on the left side of forehead.  Bleeding controlled.  No LOC said she was reaching to cut on the light in the bathroom and lost her balance.

## 2022-08-19 NOTE — ED Provider Notes (Signed)
[facility] DEPT Provider Note   CSN: [id number] Arrival date & time: 08/19/22  0042     History  Chief Complaint  Patient presents with   Bridget Lane is a 88 y.o. female.  The history is provided by the patient, the spouse and medical records.  Fall  Bridget Lane is a 88 y.o. female who presents to the Emergency Department complaining of fall.  She presents to the emergency department accompanied by her husband for evaluation of injuries after a fall.  She lives alone normally ambulates with a walker or cane.  She states that she went to bed around 730 and noticed that she left the light on in the home.  She got up to get out of bed in her dark room and fell.  She is unsure what caused her to fall.  She does report feeling slightly fatigued secondary to having company over the last several days.  No loss of consciousness.  She was able to get herself back up.  No headache, neck pain, chest pain, abdominal pain.  She does report feeling like she has a mild cold.  No fevers, dysuria, leg swelling.  She does have a history of prior subdural hematoma.  She is not on anticoagulation.  On record review tetanus was updated in December 2022.    Home Medications Prior to Admission medications   Medication Sig Start Date End Date Taking? Authorizing Provider  acetaminophen (TYLENOL) 325 MG tablet Take 650 mg by mouth every 6 (six) hours as needed for mild pain, fever or headache.   Yes [provider]  busPIRone (BUSPAR) 5 MG tablet Take 1 tablet (5 mg total) by mouth 2 (two) times daily as needed (Anxiety). Patient taking differently: Take 5 mg by mouth 2 (two) times daily. 11/01/21  Yes [name], MD  cephALEXin (KEFLEX) 250 MG capsule Take 1 capsule (250 mg total) by mouth 2 (two) times daily. 08/19/22  Yes Quintella Reichert, MD  DULoxetine (CYMBALTA) 30 MG capsule TAKE 2 CAPSULES BY MOUTH EVERY DAY 05/27/22  Yes [name],  MD  flecainide (TAMBOCOR) 100 MG tablet Take 1 tablet (100 mg total) by mouth 2 (two) times daily. 07/22/22  Yes [name], [name], MD  Ibuprofen (ADVIL PO) Take 400 mg by mouth every 8 (eight) hours as needed (pain).   Yes [provider]  metoprolol tartrate (LOPRESSOR) 25 MG tablet TAKE 1 TABLET(25 MG) BY MOUTH TWICE DAILY 12/20/21  Yes [name], MD  Multiple Vitamins-Minerals (MULTI FOR HER 50+) TABS Take 1 tablet by mouth daily.   Yes [provider]  Multiple Vitamins-Minerals (PRESERVISION AREDS 2+MULTI VIT PO) Take 1 tablet by mouth in the morning and at bedtime.   Yes [provider]  omeprazole (PRILOSEC) 40 MG capsule TAKE 1 CAPSULE(40 MG) BY MOUTH TWICE DAILY 05/21/22  Yes [name], MD  Polyethyl Glycol-Propyl Glycol 0.4-0.3 % SOLN Apply 1-2 drops to eye daily as needed (stye, dryness).   Yes [provider]  rosuvastatin (CRESTOR) 5 MG tablet Take 5 mg by mouth daily. 12/19/21  Yes [provider]      Allergies    Penicillins, Codeine, Codeine, Dabigatran etexilate mesylate, Dilaudid [hydromorphone], Acetaminophen, Dabigatran etexilate mesylate, Dabigatran etexilate mesylate, Penicillins, Sulfa antibiotics, and Sulfonamide derivatives    Review of Systems   Review of Systems  All other systems reviewed and are negative.   Physical Exam Updated Vital Signs BP  126/61 (BP Location: Right Arm)   Pulse 76   Temp (!) 97.5 F (36.4 C) (Oral)   Resp 16   Ht '5\' 4"'$  (1.626 m)   Wt 66.2 kg   SpO2 96%   BMI 25.06 kg/m  Physical Exam Vitals and nursing note reviewed.  Constitutional:      Appearance: She is well-developed.  HENT:     Head: Normocephalic.     Comments: Large and deep laceration to the right forehead, scalp approximately 10 cm long Cardiovascular:     Rate and Rhythm: Normal rate and regular rhythm.     Heart sounds: No murmur heard. Pulmonary:     Effort: Pulmonary effort is normal. No respiratory  distress.     Breath sounds: Normal breath sounds.  Abdominal:     Palpations: Abdomen is soft.     Tenderness: There is no abdominal tenderness. There is no guarding or rebound.  Musculoskeletal:        General: No tenderness.     Cervical back: Neck supple.  Skin:    General: Skin is warm and dry.  Neurological:     Mental Status: She is alert and oriented to person, place, and time.     Comments: 5 out of 5 strength in all 4 extremities  Psychiatric:        Behavior: Behavior normal.     ED Results / Procedures / Treatments   Labs (all labs ordered are listed, but only abnormal results are displayed) Labs Reviewed  BASIC METABOLIC PANEL - Abnormal; Notable for the following components:      Result Value   Chloride 112 (*)    CO2 20 (*)    BUN 36 (*)    Creatinine, Ser 1.49 (*)    GFR, Estimated 34 (*)    All other components within normal limits  CBC WITH DIFFERENTIAL/PLATELET - Abnormal; Notable for the following components:   RBC 3.52 (*)    Hemoglobin 10.6 (*)    HCT 32.9 (*)    All other components within normal limits  URINALYSIS, ROUTINE W REFLEX MICROSCOPIC - Abnormal; Notable for the following components:   Leukocytes,Ua MODERATE (*)    All other components within normal limits  URINALYSIS, MICROSCOPIC (REFLEX) - Abnormal; Notable for the following components:   Bacteria, UA RARE (*)    Non Squamous Epithelial PRESENT (*)    All other components within normal limits    EKG None  Radiology CT HEAD WO CONTRAST (5MM)  Result Date: 08/19/2022 CLINICAL DATA:  Fall, laceration to the right side of head EXAM: CT HEAD WITHOUT CONTRAST CT MAXILLOFACIAL WITHOUT CONTRAST CT CERVICAL SPINE WITHOUT CONTRAST TECHNIQUE: Multidetector CT imaging of the head, cervical spine, and maxillofacial structures were performed using the standard protocol without intravenous contrast. Multiplanar CT image reconstructions of the cervical spine and maxillofacial structures were also  generated. RADIATION DOSE REDUCTION: This exam was performed according to the departmental dose-optimization program which includes automated exposure control, adjustment of the mA and/or kV according to patient size and/or use of iterative reconstruction technique. COMPARISON:  CT head 04/05/2022, CT cervical spine and maxillofacial 10/09/2021 FINDINGS: CT HEAD FINDINGS Brain: No evidence of acute infarct, hemorrhage, mass, mass effect, or midline shift. No hydrocephalus or extra-axial fluid collection. Vascular: No hyperdense vessel. Skull: Normal. Negative for fracture or focal lesion. Other: Laceration and hematoma to the right frontal scalp. CT MAXILLOFACIAL FINDINGS Osseous: No fracture or mandibular dislocation. No destructive process. Orbits: Negative. No traumatic or inflammatory  finding. Status post bilateral lens replacements. Sinuses: Bubbly fluid in the inferior right frontal sinus and anterior right ethmoid air cells. Mild mucosal thickening in the ethmoid air cells. Soft tissues: No injury to the soft tissues of the face. Right frontal scalp laceration and hematoma are better seen on the CT head. CT CERVICAL SPINE FINDINGS Alignment: Trace anterolisthesis of C3 on C4, unchanged. Skull base and vertebrae: No acute fracture. No primary bone lesion or focal pathologic process. Status post ACDF C4-C6. Soft tissues and spinal canal: No prevertebral fluid or swelling. No visible canal hematoma. Disc levels:  Multilevel degenerative changes.  No acute change. Upper chest: Apical pleural-parenchymal scarring. No focal pulmonary opacity or pleural effusion. Other: None. IMPRESSION: 1. No acute intracranial process. 2. No acute facial bone fracture. 3. No acute fracture or traumatic listhesis in the cervical spine. 4. Right frontal scalp laceration and hematoma. Electronically Signed   By: Merilyn Baba M.D.   On: 08/19/2022 02:33   CT Maxillofacial Wo Contrast  Result Date: 08/19/2022 CLINICAL DATA:   Fall, laceration to the right side of head EXAM: CT HEAD WITHOUT CONTRAST CT MAXILLOFACIAL WITHOUT CONTRAST CT CERVICAL SPINE WITHOUT CONTRAST TECHNIQUE: Multidetector CT imaging of the head, cervical spine, and maxillofacial structures were performed using the standard protocol without intravenous contrast. Multiplanar CT image reconstructions of the cervical spine and maxillofacial structures were also generated. RADIATION DOSE REDUCTION: This exam was performed according to the departmental dose-optimization program which includes automated exposure control, adjustment of the mA and/or kV according to patient size and/or use of iterative reconstruction technique. COMPARISON:  CT head 04/05/2022, CT cervical spine and maxillofacial 10/09/2021 FINDINGS: CT HEAD FINDINGS Brain: No evidence of acute infarct, hemorrhage, mass, mass effect, or midline shift. No hydrocephalus or extra-axial fluid collection. Vascular: No hyperdense vessel. Skull: Normal. Negative for fracture or focal lesion. Other: Laceration and hematoma to the right frontal scalp. CT MAXILLOFACIAL FINDINGS Osseous: No fracture or mandibular dislocation. No destructive process. Orbits: Negative. No traumatic or inflammatory finding. Status post bilateral lens replacements. Sinuses: Bubbly fluid in the inferior right frontal sinus and anterior right ethmoid air cells. Mild mucosal thickening in the ethmoid air cells. Soft tissues: No injury to the soft tissues of the face. Right frontal scalp laceration and hematoma are better seen on the CT head. CT CERVICAL SPINE FINDINGS Alignment: Trace anterolisthesis of C3 on C4, unchanged. Skull base and vertebrae: No acute fracture. No primary bone lesion or focal pathologic process. Status post ACDF C4-C6. Soft tissues and spinal canal: No prevertebral fluid or swelling. No visible canal hematoma. Disc levels:  Multilevel degenerative changes.  No acute change. Upper chest: Apical pleural-parenchymal scarring.  No focal pulmonary opacity or pleural effusion. Other: None. IMPRESSION: 1. No acute intracranial process. 2. No acute facial bone fracture. 3. No acute fracture or traumatic listhesis in the cervical spine. 4. Right frontal scalp laceration and hematoma. Electronically Signed   By: Merilyn Baba M.D.   On: 08/19/2022 02:33   CT Cervical Spine Wo Contrast  Result Date: 08/19/2022 CLINICAL DATA:  Fall, laceration to the right side of head EXAM: CT HEAD WITHOUT CONTRAST CT MAXILLOFACIAL WITHOUT CONTRAST CT CERVICAL SPINE WITHOUT CONTRAST TECHNIQUE: Multidetector CT imaging of the head, cervical spine, and maxillofacial structures were performed using the standard protocol without intravenous contrast. Multiplanar CT image reconstructions of the cervical spine and maxillofacial structures were also generated. RADIATION DOSE REDUCTION: This exam was performed according to the departmental dose-optimization program which includes automated exposure control,  adjustment of the mA and/or kV according to patient size and/or use of iterative reconstruction technique. COMPARISON:  CT head 04/05/2022, CT cervical spine and maxillofacial 10/09/2021 FINDINGS: CT HEAD FINDINGS Brain: No evidence of acute infarct, hemorrhage, mass, mass effect, or midline shift. No hydrocephalus or extra-axial fluid collection. Vascular: No hyperdense vessel. Skull: Normal. Negative for fracture or focal lesion. Other: Laceration and hematoma to the right frontal scalp. CT MAXILLOFACIAL FINDINGS Osseous: No fracture or mandibular dislocation. No destructive process. Orbits: Negative. No traumatic or inflammatory finding. Status post bilateral lens replacements. Sinuses: Bubbly fluid in the inferior right frontal sinus and anterior right ethmoid air cells. Mild mucosal thickening in the ethmoid air cells. Soft tissues: No injury to the soft tissues of the face. Right frontal scalp laceration and hematoma are better seen on the CT head. CT  CERVICAL SPINE FINDINGS Alignment: Trace anterolisthesis of C3 on C4, unchanged. Skull base and vertebrae: No acute fracture. No primary bone lesion or focal pathologic process. Status post ACDF C4-C6. Soft tissues and spinal canal: No prevertebral fluid or swelling. No visible canal hematoma. Disc levels:  Multilevel degenerative changes.  No acute change. Upper chest: Apical pleural-parenchymal scarring. No focal pulmonary opacity or pleural effusion. Other: None. IMPRESSION: 1. No acute intracranial process. 2. No acute facial bone fracture. 3. No acute fracture or traumatic listhesis in the cervical spine. 4. Right frontal scalp laceration and hematoma. Electronically Signed   By: Merilyn Baba M.D.   On: 08/19/2022 02:33    Procedures .Marland KitchenLaceration Repair  Date/Time: 08/19/2022 3:55 AM  Performed by: Quintella Reichert, MD Authorized by: Quintella Reichert, MD   Consent:    Consent obtained:  Verbal   Consent given by:  Patient   Risks, benefits, and alternatives were discussed: yes     Risks discussed:  Infection, pain and need for additional repair Universal protocol:    Patient identity confirmed:  Verbally with patient Anesthesia:    Anesthesia method:  Local infiltration   Local anesthetic:  Lidocaine 2% WITH epi Laceration details:    Location:  Scalp   Scalp location:  R temporal   Length (cm):  10 Exploration:    Hemostasis achieved with:  Direct pressure Treatment:    Area cleansed with:  Povidone-iodine and saline   Amount of cleaning:  Extensive   Irrigation solution:  Sterile saline   Irrigation method:  Pressure wash   Debridement:  Minimal   Layers/structures repaired:  Janae Sauce:    Suture size:  5-0   Suture material:  Plain gut   Suture technique:  Simple interrupted   Number of sutures:  5 Skin repair:    Repair method:  Sutures and staples   Suture size:  4-0   Suture material:  Prolene   Suture technique:  Simple interrupted   Number of sutures:  7    Number of staples:  5 Approximation:    Approximation:  Close Repair type:    Repair type:  Complex Post-procedure details:    Dressing:  Antibiotic ointment   Procedure completion:  Tolerated well, no immediate complications     Medications Ordered in ED Medications  bacitracin ointment (1 Application Topical Given 08/19/22 0412)  lidocaine-EPINEPHrine (XYLOCAINE W/EPI) 2 %-1:100000 (with pres) injection 20 mL (20 mLs Infiltration Given by Other 08/19/22 0254)    ED Course/ Medical Decision Making/ A&P  Medical Decision Making Amount and/or Complexity of Data Reviewed Labs: ordered.  Risk OTC drugs. Prescription drug management.   Patient here from home for evaluation of injuries following a fall in a dark room.  She has a large and complex laceration to the scalp and forehead.  Wound repaired per note.  We will start prophylactic antibiotics given involvement of the galea.  No evidence of skull fracture or intracranial injury.  Patient does report poor oral intake over the last several days due to having company.  BMP with mild elevation in creatinine BUN compared to baseline.  UA not consistent with UTI, patient without current symptoms.  Discussed with patient oral fluid hydration.  Discussed wound care at home.  Discussed close PCP follow-up as well as return precautions for evidence of infection.  Patient is able to ambulate near her baseline in the emergency department prior to discharge.        Final Clinical Impression(s) / ED Diagnoses Final diagnoses:  Fall, initial encounter  Laceration of scalp, initial encounter    Rx / DC Orders ED Discharge Orders          Ordered    cephALEXin (KEFLEX) 250 MG capsule  2 times daily        08/19/22 0351              Quintella Reichert, MD 08/19/22 [phone]

## 2022-08-19 NOTE — Discharge Instructions (Addendum)
Your sutures will need to be removed in the next 5 to 7 days.  Please call your family doctor to have your kidney function rechecked in the next week.  Get rechecked sooner if you have new or concerning symptoms.

## 2022-08-19 NOTE — ED Provider Triage Note (Signed)
Emergency Medicine Provider Triage Evaluation Note  Bridget Lane , a 88 y.o. female  was evaluated in triage.  Pt complains of fall and head laceration.  So I laid on in the house, got up from sleeping thinks she fell into a door frame.  She has large deep laceration to her right head.  History of prior head bleed.  She is not anticoagulated.  No lightheadedness, dizziness, syncope. Ambulatory PTA  Review of Systems  Positive: Fall, laceration Negative:   Physical Exam  BP (!) 124/59 (BP Location: Right Arm)   Pulse 63   Temp 98.2 F (36.8 C) (Oral)   Resp 18   Ht '5\' 4"'$  (1.626 m)   Wt 66.2 kg   SpO2 98%   BMI 25.06 kg/m  Gen:   Awake, no distress   Head:  Large, deep laceration right frontal into parietal scalp Resp:  Normal effort  MSK:   Moves extremities without difficulty  Other:    Medical Decision Making  Medically screening exam initiated at 1:29 AM.  Appropriate orders placed.  DARIANE NATZKE was informed that the remainder of the evaluation will be completed by another provider, this initial triage assessment does not replace that evaluation, and the importance of remaining in the ED until their evaluation is complete.  Fall, head injury   Antonio Creswell A, PA-C 08/19/22 0131

## 2022-08-26 ENCOUNTER — Encounter: Payer: Self-pay | Admitting: Internal Medicine

## 2022-08-26 ENCOUNTER — Ambulatory Visit: Payer: Medicare Other | Admitting: Internal Medicine

## 2022-08-26 VITALS — BP 130/64 | HR 61 | Temp 98.3°F | Resp 12 | Ht 64.0 in

## 2022-08-26 DIAGNOSIS — Z4802 Encounter for removal of sutures: Secondary | ICD-10-CM

## 2022-08-26 DIAGNOSIS — S0101XD Laceration without foreign body of scalp, subsequent encounter: Secondary | ICD-10-CM | POA: Diagnosis not present

## 2022-08-26 DIAGNOSIS — W19XXXS Unspecified fall, sequela: Secondary | ICD-10-CM

## 2022-08-26 NOTE — Progress Notes (Signed)
  [facility]:  [phone]   Routine Medical Office Visit  Patient:  Bridget Lane      Age: 88 y.o.       Sex:  female  Date:   08/26/2022  PCP:    [name], MD    [place] Provider: Loralee Pacas, MD  Assessment/Plan:   Shadae was seen today for hospitalization follow-up and suture / staple removal.  Laceration of scalp, subsequent encounter  Fall, sequela   She requested I remove stitches. There were 7 blue stitches and 5 staples   There were no signs of infection and wound healing well enough for that  I removed all stitches ( and staples with close observation looking to make sure I got them all (difficult due to hair)   I encouraged her to follow up with Dr. Yong Channel and I offered to refer her to phys therapy while waiting- she declined  I offered pain meds she declined.    Subjective:   Bridget Lane is a 88 y.o. female with PMH significant for: Past Medical History:  Diagnosis Date   Atrial fibrillation ([place])    persistent   Colon polyp 2005   Dementia ([place])    Depression    DJD (degenerative joint disease)    Dysrhythmia    Esophageal stricture    Family history of breast cancer    Family history of esophageal cancer    Family history of prostate cancer    Gait abnormality 07/07/2020   GERD (gastroesophageal reflux disease)    H. pylori infection    Hiatal hernia    Hyperlipidemia    Lumbar spinal stenosis 07/07/2020   L4-5   Macular degeneration    Nontoxic multinodular goiter    OA (osteoarthritis)    Postherpetic neuralgia at T3-T5 level 04/27/2011   Rectal bleeding 07/06/2014   Hemorrhoid related in past.       She is presenting today with: Chief Complaint  Patient presents with   Hospitalization Follow-up    [facility] about eleven days ago, injured head (has stitches) and side of eye and bridge of nose is bruised. Got up to use the bathroom without light and could not remember how she fell.   Suture /  Staple Removal     Additional physician collected history: See Assessment/Plan section for per problem updates to history (overview and a/p subsections) as reported by patient today. All she wants is removal.        Objective:  Physical Exam: BP 130/64 (BP Location: Left Arm, Patient Position: Sitting)   Pulse 61   Temp 98.3 F (36.8 C) (Temporal)   Resp 12   Ht '5\' 4"'$  (1.626 m)   SpO2 99%   BMI 25.06 kg/m   She is a polite, friendly, and genuine person Constitutional: NAD, AAO, not ill-appearing  Neuro: alert, no focal deficit obvious, articulate speech Psych: normal mood, behavior, thought content  Problem specific physical exam findings:  Extensive bruising and injury right upper face/eye/nose.  Complex laceration healing well with 7 stitches and 5 staples- removed after wiping with alcohol and spray numbing with pain ease    No images are attached to the encounter.

## 2022-08-27 ENCOUNTER — Ambulatory Visit (INDEPENDENT_AMBULATORY_CARE_PROVIDER_SITE_OTHER): Payer: Medicare Other | Admitting: Family Medicine

## 2022-08-27 ENCOUNTER — Encounter: Payer: Self-pay | Admitting: Family Medicine

## 2022-08-27 ENCOUNTER — Telehealth: Payer: Self-pay | Admitting: Family Medicine

## 2022-08-27 VITALS — BP 122/70 | HR 84 | Temp 98.1°F | Ht 64.0 in | Wt 153.2 lb

## 2022-08-27 DIAGNOSIS — R739 Hyperglycemia, unspecified: Secondary | ICD-10-CM | POA: Diagnosis not present

## 2022-08-27 DIAGNOSIS — R7989 Other specified abnormal findings of blood chemistry: Secondary | ICD-10-CM

## 2022-08-27 DIAGNOSIS — E785 Hyperlipidemia, unspecified: Secondary | ICD-10-CM

## 2022-08-27 DIAGNOSIS — R944 Abnormal results of kidney function studies: Secondary | ICD-10-CM | POA: Diagnosis not present

## 2022-08-27 DIAGNOSIS — I480 Paroxysmal atrial fibrillation: Secondary | ICD-10-CM

## 2022-08-27 DIAGNOSIS — F3342 Major depressive disorder, recurrent, in full remission: Secondary | ICD-10-CM
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
## 2022-08-27 NOTE — Telephone Encounter (Signed)
Following OV with PCP, pt was instructed to schedule CPE w/pcp in 4 months. She was also informed that she needs to schedule her annual wellness visit with Charlott Rakes.   Due to patient being tired she stated she will call in the morning to schedule. Patient states she wanted her daughter, Bridget Lane, to be made aware of appointment times when scheduled.

## 2022-08-27 NOTE — Patient Instructions (Addendum)
Health Maintenance Due  Topic Date Due   Medicare Annual Wellness (AWV)  Never done  You are eligible to schedule your annual wellness visit with our nurse specialist Otila Kluver.  Please consider scheduling this before you leave today  I am glad you are still alive! This was very scary. We have to avoid falls. Lets have you use a cane at all times especially at night- if any falls with cane use walker -glad you are using more lights at night so you will be safer  Thanks for doing labs- we are checking in on kidney fucntion  Recommended follow up: Return in about 4 months (around 12/26/2022) for physical or sooner if needed.Schedule b4 you leave. -or next available

## 2022-08-28 LAB — COMPREHENSIVE METABOLIC PANEL
ALT: 14 U/L (ref 0–35)
AST: 18 U/L (ref 0–37)
Albumin: 3.7 g/dL (ref 3.5–5.2)
Alkaline Phosphatase: 67 U/L (ref 39–117)
BUN: 26 mg/dL — ABNORMAL HIGH (ref 6–23)
CO2: 25 mEq/L (ref 19–32)
Calcium: 9.2 mg/dL (ref 8.4–10.5)
Chloride: 108 mEq/L (ref 96–112)
Creatinine, Ser: 1.16 mg/dL (ref 0.40–1.20)
GFR: 42.45 mL/min — ABNORMAL LOW (ref >60.00)
Glucose, Bld: 68 mg/dL — ABNORMAL LOW (ref 70–99)
Potassium: 4.7 mEq/L (ref 3.5–5.1)
Sodium: 139 mEq/L (ref 135–145)
Total Bilirubin: 0.4 mg/dL (ref 0.2–1.2)
Total Protein: 6.2 g/dL (ref 6.0–8.3)

## 2022-09-02 ENCOUNTER — Telehealth: Payer: Self-pay | Admitting: Family Medicine

## 2022-09-02 ENCOUNTER — Telehealth: Payer: Self-pay | Admitting: Pharmacist

## 2022-09-02 NOTE — Progress Notes (Signed)
    Chronic Care Management Pharmacy Assistant   Name: Bridget Lane  MRN: [id number] DOB: [birth year]   Reason for Encounter: General Adherence Call    Recent office visits:  08/27/2022 OV (PCP) [name], MD; no medication changes noted.  08/26/2022 OV (Fam Med) Loralee Pacas, MD; no medication changes noted.  Recent consult visits:  07/17/2022 OV (Sports Medicine) [name], MD; We will try Monovisc injection today   07/09/2022 OV (Cardiology) [name], [name], MD; no medication changes noted.  05/15/2022 OV (Oncology) [name], MD;   Hospital visits:  08/19/2022 ED visit due to Fall -Rx cephalexin 250 mg two times daily  Medications: Outpatient Encounter Medications as of 09/02/2022  Medication Sig   acetaminophen (TYLENOL) 325 MG tablet Take 650 mg by mouth every 6 (six) hours as needed for mild pain, fever or headache.   busPIRone (BUSPAR) 5 MG tablet Take 1 tablet (5 mg total) by mouth 2 (two) times daily as needed (Anxiety). (Patient taking differently: Take 5 mg by mouth 2 (two) times daily.)   cephALEXin (KEFLEX) 250 MG capsule Take 1 capsule (250 mg total) by mouth 2 (two) times daily.   DULoxetine (CYMBALTA) 30 MG capsule TAKE 2 CAPSULES BY MOUTH EVERY DAY   flecainide (TAMBOCOR) 100 MG tablet Take 1 tablet (100 mg total) by mouth 2 (two) times daily.   Ibuprofen (ADVIL PO) Take 400 mg by mouth every 8 (eight) hours as needed (pain).   metoprolol tartrate (LOPRESSOR) 25 MG tablet TAKE 1 TABLET(25 MG) BY MOUTH TWICE DAILY   Multiple Vitamins-Minerals (MULTI FOR HER 50+) TABS Take 1 tablet by mouth daily.   Multiple Vitamins-Minerals (PRESERVISION AREDS 2+MULTI VIT PO) Take 1 tablet by mouth in the morning and at bedtime.   omeprazole (PRILOSEC) 40 MG capsule TAKE 1 CAPSULE(40 MG) BY MOUTH TWICE DAILY   Polyethyl Glycol-Propyl Glycol 0.4-0.3 % SOLN Apply 1-2 drops to eye daily as needed (stye, dryness).   rosuvastatin (CRESTOR) 5 MG tablet Take 5 mg  by mouth daily.   No facility-administered encounter medications on file as of 09/02/2022.    Care Gaps: Medicare Annual Wellness: Overdue - never done  Hemoglobin A1C: 5.9% on 09/03/2021 Dexa Scan: Completed  Future Appointments  Date Time Provider [place]  10/29/2022 12:45 PM [facility] HEALTH COACH [facility]  12/05/2022  1:45 PM Alric Ran, MD [facility] None  05/20/2023  2:45 PM [name], MD [facility] None   Star Rating Drugs: Rosuvastatin 5 mg last filled 07/06/2022 90 DS  [name], [place] Pharmacist Assistant [phone]

## 2022-09-02 NOTE — Telephone Encounter (Signed)
ERROR

## 2022-09-02 NOTE — Telephone Encounter (Signed)
I was able to schedule AWV for 10/29/2022 @ 12:45pm in person. While on the phone, patient's daughter requested sending a message to PCP  Caller states: - Patient's feet have been noticeably swelling  Caller requests: -Recommendations from PCP or prescription that could decrease patient's swelling

## 2022-09-03 NOTE — Telephone Encounter (Signed)
Please schedule ov to evaluate swelling.

## 2022-09-03 NOTE — Telephone Encounter (Signed)
If swelling is increasing would recommend a visit to evaluate further

## 2022-09-03 NOTE — Telephone Encounter (Signed)
Pt was just seen on 11/07, do you want to see again to evaluate swelling?

## 2022-09-03 NOTE — Telephone Encounter (Signed)
FYI

## 2022-09-03 NOTE — Telephone Encounter (Signed)
Patient states: -Swelling has decreased and PCP has already evaluated when she was last here on 11/07 - Her daughter, who drives her to appointments, recently had back surgery  - She will call to make an appointment if swelling gets bad again

## 2022-09-19 ENCOUNTER — Other Ambulatory Visit: Payer: Self-pay | Admitting: Family Medicine

## 2022-10-23 ENCOUNTER — Telehealth: Payer: Self-pay | Admitting: Family Medicine

## 2022-10-23 ENCOUNTER — Telehealth: Payer: Self-pay | Admitting: Neurology

## 2022-10-23 NOTE — Telephone Encounter (Signed)
In order for any medication changes to be made the patient needs to be examined in the office. It has been one year since her last visit. Please schedule patient for a sooner appointment. Okay to use NP slot.

## 2022-10-23 NOTE — Telephone Encounter (Signed)
Visit appropriate-if worsening can seek care sooner-I am unavailable this afternoon-will be out of office

## 2022-10-23 NOTE — Telephone Encounter (Signed)
Due to last minute cancellation, pt has been rescheduled to see PCP on 10/24/22 '@3pm'$ . Caller states she wants to make it sure PCP knows to not let patient know that daughter made him aware of mental state worsening.

## 2022-10-23 NOTE — Telephone Encounter (Signed)
See below

## 2022-10-23 NOTE — Telephone Encounter (Signed)
Caller states: - Pt has a possible UTI and slight swelling in lower legs  - She is concerned due to patient's mental status diminishing  - Patient has been having "wild ideations" about those around her  - She isn't able to speak about this around patient  Caller requests: -PCP give her a call directly for recommendations of what can be done for her mental state    Patient has been scheduled for possible UTI on 10/24/22 @ 3:40pm w/ Colletta [place] Hudnell. Please Advise.
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
## 2022-10-24 ENCOUNTER — Encounter: Payer: Self-pay | Admitting: Family Medicine

## 2022-10-24 ENCOUNTER — Ambulatory Visit (INDEPENDENT_AMBULATORY_CARE_PROVIDER_SITE_OTHER): Payer: BLUE CROSS/BLUE SHIELD | Admitting: Family Medicine

## 2022-10-24 ENCOUNTER — Ambulatory Visit: Payer: BLUE CROSS/BLUE SHIELD | Admitting: Family

## 2022-10-24 VITALS — BP 122/70 | HR 72 | Temp 97.9°F | Ht 64.0 in | Wt 141.2 lb

## 2022-10-24 DIAGNOSIS — G3184 Mild cognitive impairment, so stated: Secondary | ICD-10-CM | POA: Diagnosis not present

## 2022-10-24 DIAGNOSIS — G301 Alzheimer's disease with late onset: Secondary | ICD-10-CM | POA: Insufficient documentation

## 2022-10-24 DIAGNOSIS — R300 Dysuria: Secondary | ICD-10-CM

## 2022-10-24 DIAGNOSIS — L989 Disorder of the skin and subcutaneous tissue, unspecified: Secondary | ICD-10-CM | POA: Diagnosis not present

## 2022-10-24 DIAGNOSIS — R531 Weakness: Secondary | ICD-10-CM

## 2022-10-24 DIAGNOSIS — Z9181 History of falling: Secondary | ICD-10-CM

## 2022-10-24 DIAGNOSIS — I480 Paroxysmal atrial fibrillation: Secondary | ICD-10-CM

## 2022-10-24 DIAGNOSIS — F3341 Major depressive disorder, recurrent, in partial remission: Secondary | ICD-10-CM

## 2022-10-24 NOTE — Patient Instructions (Addendum)
We will call you within two weeks about your referral to Dermatology with Dr. [place] Crane and home health.  If you do not hear within 2 weeks, give [place] a call.   Please stop by lab before you go If you have mychart- we will send your results within 3 business days of [place] receiving them.  If you do not have mychart- we will call you about results within 5 business days of [place] receiving them.  *please also note that you will see labs on mychart as soon as they post. I will later go in and write notes on them- will say "notes from Dr. Yong Channel"   We will treat you for UTI if we find this clearly on labs.   Recommended follow up: Return in about 4 months (around 02/22/2023) for physical or sooner if needed.Schedule b4 you leave. Or sooner if needed
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
## 2022-10-28 ENCOUNTER — Encounter: Payer: Self-pay | Admitting: Neurology

## 2022-10-28 ENCOUNTER — Ambulatory Visit: Payer: Medicare Other | Admitting: Neurology

## 2022-10-28 VITALS — BP 108/48 | HR 58 | Ht 64.0 in | Wt 144.0 lb

## 2022-10-28 DIAGNOSIS — I700 Atherosclerosis of aorta: Secondary | ICD-10-CM | POA: Diagnosis not present

## 2022-10-28 DIAGNOSIS — F02A2 Dementia in other diseases classified elsewhere, mild, with psychotic disturbance: Secondary | ICD-10-CM

## 2022-10-28 DIAGNOSIS — M4802 Spinal stenosis, cervical region: Secondary | ICD-10-CM | POA: Diagnosis not present

## 2022-10-28 DIAGNOSIS — G3184 Mild cognitive impairment, so stated: Secondary | ICD-10-CM

## 2022-10-28 DIAGNOSIS — Z9181 History of falling: Secondary | ICD-10-CM | POA: Diagnosis not present

## 2022-10-28 DIAGNOSIS — E042 Nontoxic multinodular goiter: Secondary | ICD-10-CM | POA: Diagnosis not present

## 2022-10-28 DIAGNOSIS — F419 Anxiety disorder, unspecified: Secondary | ICD-10-CM | POA: Diagnosis not present

## 2022-10-28 DIAGNOSIS — Z853 Personal history of malignant neoplasm of breast: Secondary | ICD-10-CM | POA: Diagnosis not present

## 2022-10-28 DIAGNOSIS — I480 Paroxysmal atrial fibrillation: Secondary | ICD-10-CM | POA: Diagnosis not present

## 2022-10-28 DIAGNOSIS — M1990 Unspecified osteoarthritis, unspecified site: Secondary | ICD-10-CM | POA: Diagnosis not present

## 2022-10-28 DIAGNOSIS — E785 Hyperlipidemia, unspecified: Secondary | ICD-10-CM | POA: Diagnosis not present

## 2022-10-28 DIAGNOSIS — Z86010 Personal history of colonic polyps: Secondary | ICD-10-CM | POA: Diagnosis not present

## 2022-10-28 DIAGNOSIS — M810 Age-related osteoporosis without current pathological fracture: Secondary | ICD-10-CM | POA: Diagnosis not present

## 2022-10-28 DIAGNOSIS — G301 Alzheimer's disease with late onset: Secondary | ICD-10-CM

## 2022-10-28 DIAGNOSIS — I10 Essential (primary) hypertension: Secondary | ICD-10-CM | POA: Diagnosis not present

## 2022-10-28 DIAGNOSIS — D6869 Other thrombophilia: Secondary | ICD-10-CM | POA: Diagnosis not present

## 2022-10-28 DIAGNOSIS — F3341 Major depressive disorder, recurrent, in partial remission: Secondary | ICD-10-CM | POA: Diagnosis not present

## 2022-10-28 DIAGNOSIS — K219 Gastro-esophageal reflux disease without esophagitis: Secondary | ICD-10-CM | POA: Diagnosis not present

## 2022-10-28 DIAGNOSIS — G4700 Insomnia, unspecified: Secondary | ICD-10-CM | POA: Diagnosis not present

## 2022-10-28 DIAGNOSIS — G4733 Obstructive sleep apnea (adult) (pediatric): Secondary | ICD-10-CM | POA: Diagnosis not present

## 2022-10-28 DIAGNOSIS — M48061 Spinal stenosis, lumbar region without neurogenic claudication: Secondary | ICD-10-CM | POA: Diagnosis not present

## 2022-10-28 DIAGNOSIS — H269 Unspecified cataract: Secondary | ICD-10-CM | POA: Diagnosis not present

## 2022-10-28 MED ORDER — QUETIAPINE FUMARATE 25 MG PO TABS: 25.0000 mg | ORAL_TABLET | Freq: Every day | ORAL | 0 refills | Status: DC

## 2022-10-28 NOTE — Patient Instructions (Signed)
ATN profile to look for Alzheimer biomarker Start low-dose Seroquel 12.5 mg nightly Continue your other medications Follow-up in 6 months or sooner if worse.

## 2022-10-28 NOTE — Telephone Encounter (Signed)
Pt's daughter,[name] Patrum asking not to mention she has called Dr. Jabier Mutton office. Pt will get upset and be suspicious of her.
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
## 2022-10-29 ENCOUNTER — Ambulatory Visit: Payer: Medicare Other

## 2022-11-01 ENCOUNTER — Telehealth: Payer: Self-pay | Admitting: Family Medicine

## 2022-11-01 DIAGNOSIS — F3341 Major depressive disorder, recurrent, in partial remission: Secondary | ICD-10-CM | POA: Diagnosis not present

## 2022-11-01 DIAGNOSIS — E042 Nontoxic multinodular goiter: Secondary | ICD-10-CM | POA: Diagnosis not present

## 2022-11-01 DIAGNOSIS — M48061 Spinal stenosis, lumbar region without neurogenic claudication: Secondary | ICD-10-CM | POA: Diagnosis not present

## 2022-11-01 DIAGNOSIS — M810 Age-related osteoporosis without current pathological fracture: Secondary | ICD-10-CM | POA: Diagnosis not present

## 2022-11-01 DIAGNOSIS — M4802 Spinal stenosis, cervical region: Secondary | ICD-10-CM | POA: Diagnosis not present

## 2022-11-01 DIAGNOSIS — I10 Essential (primary) hypertension: Secondary | ICD-10-CM | POA: Diagnosis not present

## 2022-11-01 DIAGNOSIS — I700 Atherosclerosis of aorta: Secondary | ICD-10-CM | POA: Diagnosis not present

## 2022-11-01 DIAGNOSIS — E785 Hyperlipidemia, unspecified: Secondary | ICD-10-CM | POA: Diagnosis not present

## 2022-11-01 DIAGNOSIS — M1990 Unspecified osteoarthritis, unspecified site: Secondary | ICD-10-CM | POA: Diagnosis not present

## 2022-11-01 DIAGNOSIS — G4700 Insomnia, unspecified: Secondary | ICD-10-CM | POA: Diagnosis not present

## 2022-11-01 DIAGNOSIS — G3184 Mild cognitive impairment, so stated: Secondary | ICD-10-CM | POA: Diagnosis not present

## 2022-11-01 DIAGNOSIS — F419 Anxiety disorder, unspecified: Secondary | ICD-10-CM | POA: Diagnosis not present

## 2022-11-01 DIAGNOSIS — K219 Gastro-esophageal reflux disease without esophagitis: Secondary | ICD-10-CM | POA: Diagnosis not present

## 2022-11-01 DIAGNOSIS — G301 Alzheimer's disease with late onset: Secondary | ICD-10-CM

## 2022-11-01 DIAGNOSIS — G4733 Obstructive sleep apnea (adult) (pediatric): Secondary | ICD-10-CM | POA: Diagnosis not present

## 2022-11-01 DIAGNOSIS — I480 Paroxysmal atrial fibrillation: Secondary | ICD-10-CM | POA: Diagnosis not present

## 2022-11-01 DIAGNOSIS — F02A2 Dementia in other diseases classified elsewhere, mild, with psychotic disturbance: Secondary | ICD-10-CM

## 2022-11-01 DIAGNOSIS — D6869 Other thrombophilia: Secondary | ICD-10-CM | POA: Diagnosis not present

## 2022-11-01 LAB — ATN PROFILE
A -- Beta-amyloid 42/40 Ratio: 0.1 — ABNORMAL LOW (ref >0.102)
Beta-amyloid 40: 234.03 pg/mL
Beta-amyloid 42: 23.48 pg/mL
N -- NfL, Plasma: 7.86 pg/mL (ref 0.00–11.55)
T -- p-tau181: 2.78 pg/mL — ABNORMAL HIGH (ref 0.00–0.97)

## 2022-11-01 MED ORDER — DONEPEZIL HCL 5 MG PO TABS: 5.0000 mg | ORAL_TABLET | Freq: Every day | ORAL | 6 refills | Status: DC

## 2022-11-01 NOTE — Telephone Encounter (Signed)
Caller states; -Pt went to neurologist, Dr. Maryan Puls and had labs completed - Labs showed markers for dementia and pt was diagnosed with mild late onset alzheimer's dementia. - Neurologist recommended asking PCP for referral to psychiatry  Caller requests: - PCP send referral to psychiatry, specifically someone who deals with dementia or memory problems often - PCP team only inform her of updates

## 2022-11-01 NOTE — Telephone Encounter (Signed)
Final Disposition: Clinical call; Advised pt to call back if new or worsening symptoms  Patient Name: Bridget Lane Gender: Female DOB: Apr 17, 1933 Age: 89 Y 77 M Return Phone Number: [phone] (Primary), [phone] (Secondary), [phone] (Alternate) Address: City/ State/ Zip: [place]  [address] Client [place] [facility] [place] [facility] [place] [facility] [place] [facility] Provider Garret Reddish- MD Contact Type Call Who Is Calling Patient / Member / Family / Caregiver Call Type Triage / Clinical Caller Name Benancio Deeds Relationship To Patient Daughter Return Phone Number [phone] (Primary) Chief Complaint BLOOD PRESSURE LOW - Systolic (top number) 90 or less Reason for Call Symptomatic / Request for [name] states she is calling from the office with a pt on the line pt is currently having wobble ankles when standing pt has a pulse of 46 and BP 104/56 Translation No Nurse Assessment Nurse: Eugenio Hoes, RN, Jenny Reichmann Date/Time (Eastern Time): 11/01/2022 4:43:20 PM Confirm and document reason for call. If symptomatic, describe symptoms. ---Caller states that her mother's Occupational therapist called the office about low pulse and low blood pressure. Caller states that office called her about her mother's symptoms and she is now calling back to notify that her mother is not having symptoms now. Denies chest pain. 135/69 is current blood pressure and pulse is 55. Per patient pulse is normally at that range. Denies shortness of breath or lightheadedness. Does the patient have any new or worsening symptoms? ---No Please document clinical information provided and list any resource used. ---Advised caller/patient to call us back for any new or worsening symptoms. Disp. Time Eilene Ghazi Time) Disposition Final User 11/01/2022 4:40:11 PM Send to Urgent Sol Passer 11/01/2022 4:50:56 PM  Clinical Call Yes Eugenio Hoes RN, Jenny Reichmann Final Disposition 11/01/2022 4:50:56 PM Clinical Call Yes Eugenio Hoes, RN, Jenny Reichmann

## 2022-11-01 NOTE — Addendum Note (Signed)
Addended byAlric Ran on: 11/01/2022 09:08 AM   Modules accepted: Orders

## 2022-11-01 NOTE — Progress Notes (Signed)
Can we look into prior authorization for the Seroquel. Thanks

## 2022-11-01 NOTE — Telephone Encounter (Signed)
..  Home Health Verbal Orders  Agency:  [facility]   Caller: [place], [place]  Requesting OT/ PT/ Skilled nursing/ Social Work/ Speech:  OT  Reason for Request:  Work on [employer] and Fall prevention  Frequency:  1 x 6 weeks

## 2022-11-01 NOTE — Telephone Encounter (Signed)
Patient pulse 46- BP 104/56 / BIMF 9/15 and wobbly ankles when standing per Therapist. Patient has been triaged.

## 2022-11-01 NOTE — Telephone Encounter (Signed)
Placed referral to Dr. Casimiro Needle -Have them reach out to [place] if they have not heard within 2 or 3 weeks-unfortunately can take a prolonged period of time to get into psychiatry

## 2022-11-04 DIAGNOSIS — H353113 Nonexudative age-related macular degeneration, right eye, advanced atrophic without subfoveal involvement: Secondary | ICD-10-CM | POA: Diagnosis not present

## 2022-11-04 DIAGNOSIS — H35033 Hypertensive retinopathy, bilateral: Secondary | ICD-10-CM | POA: Diagnosis not present

## 2022-11-04 DIAGNOSIS — H353124 Nonexudative age-related macular degeneration, left eye, advanced atrophic with subfoveal involvement: Secondary | ICD-10-CM | POA: Diagnosis not present

## 2022-11-04 DIAGNOSIS — H43813 Vitreous degeneration, bilateral: Secondary | ICD-10-CM | POA: Diagnosis not present

## 2022-11-04 NOTE — Telephone Encounter (Signed)
May need visit if persistent/recurrent issues.  Glad she improved

## 2022-11-04 NOTE — Telephone Encounter (Signed)
Yes thanks- ok for verbals

## 2022-11-05 ENCOUNTER — Other Ambulatory Visit (HOSPITAL_COMMUNITY): Payer: Self-pay

## 2022-11-05 ENCOUNTER — Telehealth: Payer: Self-pay | Admitting: Neurology

## 2022-11-05 MED ORDER — QUETIAPINE FUMARATE 25 MG PO TABS: 12.5000 mg | ORAL_TABLET | Freq: Every day | ORAL | 0 refills | Status: DC

## 2022-11-05 NOTE — Telephone Encounter (Signed)
   I have started the PA but need verification on this question above. Also the RX states 25 mg nightly but the chart notes states starting at 12.5 mg nightly-can you please clarify which dose/strength the PT will be taking.

## 2022-11-05 NOTE — Telephone Encounter (Signed)
Done. Thanks for your help

## 2022-11-05 NOTE — Telephone Encounter (Signed)
Called and spoke with [place] and VO given.

## 2022-11-05 NOTE — Addendum Note (Signed)
Addended byAlric Ran on: 11/05/2022 04:57 PM   Modules accepted: Orders

## 2022-11-05 NOTE — Telephone Encounter (Signed)
Pt daughter is calling. Stated she needs a PA for QUEtiapine (SEROQUEL) 25 MG tablet. Stated she needs something that she can pick up from pharmacy today because she is getting all her meds put in a medi-pack. She is requesting a call back from the nurse.

## 2022-11-06 ENCOUNTER — Telehealth: Payer: Self-pay | Admitting: Family Medicine

## 2022-11-06 NOTE — Telephone Encounter (Signed)
  Encourage patient to contact the pharmacy for refills or they can request refills through [place]:  Please schedule appointment if longer than 1 year  NEXT APPOINTMENT DATE:  MEDICATION:  DULoxetine (CYMBALTA) 30 MG capsule   Is the patient out of medication? Yes  PHARMACY:  [phone] - [place], [place] - [address] [place] [address] [place] [address] Phone: [phone]  Fax: [phone]      Let patient know to contact pharmacy at the end of the day to make sure medication is ready.  Please notify patient to allow 48-72 hours to process

## 2022-11-06 NOTE — Telephone Encounter (Signed)
Pharmacy Patient Advocate Encounter  Prior Authorization for QUEtiapine Fumarate '25MG'$  tablets has been approved.    PA# PA ID was not provided Effective dates: 11/06/2022 through 11/07/2023

## 2022-11-06 NOTE — Telephone Encounter (Signed)
Bridget Lane from [facility] called. Stated medication  QUEtiapine Fumarate '25MG'$  tablets was approved for one year.

## 2022-11-06 NOTE — Telephone Encounter (Signed)
Informed pt daughter that PA was approved. And she said the pharmacy has called also and she will go pick it up.

## 2022-11-06 NOTE — Telephone Encounter (Signed)
Pharmacy Patient Advocate Encounter   Received notification from [facility] that prior authorization for QUEtiapine Fumarate '25MG'$  tablets is required/requested.    PA submitted on 11/06/2022 to (ins) [place] via CoverMyMeds Key [id number] Status is pending

## 2022-11-07 DIAGNOSIS — F419 Anxiety disorder, unspecified: Secondary | ICD-10-CM | POA: Diagnosis not present

## 2022-11-07 DIAGNOSIS — I700 Atherosclerosis of aorta: Secondary | ICD-10-CM | POA: Diagnosis not present

## 2022-11-07 DIAGNOSIS — I10 Essential (primary) hypertension: Secondary | ICD-10-CM | POA: Diagnosis not present

## 2022-11-07 DIAGNOSIS — F3341 Major depressive disorder, recurrent, in partial remission: Secondary | ICD-10-CM | POA: Diagnosis not present

## 2022-11-07 DIAGNOSIS — E042 Nontoxic multinodular goiter: Secondary | ICD-10-CM | POA: Diagnosis not present

## 2022-11-07 DIAGNOSIS — M810 Age-related osteoporosis without current pathological fracture: Secondary | ICD-10-CM | POA: Diagnosis not present

## 2022-11-07 DIAGNOSIS — I480 Paroxysmal atrial fibrillation: Secondary | ICD-10-CM | POA: Diagnosis not present

## 2022-11-07 DIAGNOSIS — M4802 Spinal stenosis, cervical region: Secondary | ICD-10-CM | POA: Diagnosis not present

## 2022-11-07 DIAGNOSIS — M1990 Unspecified osteoarthritis, unspecified site: Secondary | ICD-10-CM | POA: Diagnosis not present

## 2022-11-07 DIAGNOSIS — G3184 Mild cognitive impairment, so stated: Secondary | ICD-10-CM | POA: Diagnosis not present

## 2022-11-07 DIAGNOSIS — G4700 Insomnia, unspecified: Secondary | ICD-10-CM | POA: Diagnosis not present

## 2022-11-07 DIAGNOSIS — D6869 Other thrombophilia: Secondary | ICD-10-CM | POA: Diagnosis not present

## 2022-11-07 DIAGNOSIS — K219 Gastro-esophageal reflux disease without esophagitis: Secondary | ICD-10-CM | POA: Diagnosis not present

## 2022-11-07 DIAGNOSIS — G4733 Obstructive sleep apnea (adult) (pediatric): Secondary | ICD-10-CM | POA: Diagnosis not present

## 2022-11-07 DIAGNOSIS — M48061 Spinal stenosis, lumbar region without neurogenic claudication: Secondary | ICD-10-CM | POA: Diagnosis not present

## 2022-11-07 DIAGNOSIS — E785 Hyperlipidemia, unspecified: Secondary | ICD-10-CM | POA: Diagnosis not present

## 2022-11-07 MED ORDER — DULOXETINE HCL 30 MG PO CPEP: 60.0000 mg | ORAL_CAPSULE | Freq: Every day | ORAL | 3 refills | Status: DC

## 2022-11-07 NOTE — Telephone Encounter (Signed)
Rx refilled. 

## 2022-11-09 ENCOUNTER — Other Ambulatory Visit: Payer: Self-pay | Admitting: Family Medicine

## 2022-11-11 ENCOUNTER — Telehealth: Payer: Self-pay | Admitting: Family Medicine

## 2022-11-11 NOTE — Telephone Encounter (Signed)
Noted  

## 2022-11-11 NOTE — Telephone Encounter (Signed)
.  Home Health Certification or Plan of Care Tracking  Is this a Certification or Plan of Care? yes  [place] Agency: [facility]  Order Number:  [id number]  Has charge sheet been attached? yes  Where has form been placed:  In provider's box  Faxed to:   [phone]

## 2022-11-13 DIAGNOSIS — M1990 Unspecified osteoarthritis, unspecified site: Secondary | ICD-10-CM | POA: Diagnosis not present

## 2022-11-13 DIAGNOSIS — E785 Hyperlipidemia, unspecified: Secondary | ICD-10-CM | POA: Diagnosis not present

## 2022-11-13 DIAGNOSIS — K219 Gastro-esophageal reflux disease without esophagitis: Secondary | ICD-10-CM | POA: Diagnosis not present

## 2022-11-13 DIAGNOSIS — I480 Paroxysmal atrial fibrillation: Secondary | ICD-10-CM | POA: Diagnosis not present

## 2022-11-13 DIAGNOSIS — I10 Essential (primary) hypertension: Secondary | ICD-10-CM | POA: Diagnosis not present

## 2022-11-13 DIAGNOSIS — D6869 Other thrombophilia: Secondary | ICD-10-CM | POA: Diagnosis not present

## 2022-11-13 DIAGNOSIS — G3184 Mild cognitive impairment, so stated: Secondary | ICD-10-CM | POA: Diagnosis not present

## 2022-11-13 DIAGNOSIS — M4802 Spinal stenosis, cervical region: Secondary | ICD-10-CM | POA: Diagnosis not present

## 2022-11-13 DIAGNOSIS — M48061 Spinal stenosis, lumbar region without neurogenic claudication: Secondary | ICD-10-CM | POA: Diagnosis not present

## 2022-11-13 DIAGNOSIS — E042 Nontoxic multinodular goiter: Secondary | ICD-10-CM | POA: Diagnosis not present

## 2022-11-13 DIAGNOSIS — G4700 Insomnia, unspecified: Secondary | ICD-10-CM | POA: Diagnosis not present

## 2022-11-13 DIAGNOSIS — G4733 Obstructive sleep apnea (adult) (pediatric): Secondary | ICD-10-CM | POA: Diagnosis not present

## 2022-11-13 DIAGNOSIS — I700 Atherosclerosis of aorta: Secondary | ICD-10-CM | POA: Diagnosis not present

## 2022-11-13 DIAGNOSIS — M810 Age-related osteoporosis without current pathological fracture: Secondary | ICD-10-CM | POA: Diagnosis not present

## 2022-11-13 DIAGNOSIS — F3341 Major depressive disorder, recurrent, in partial remission: Secondary | ICD-10-CM | POA: Diagnosis not present

## 2022-11-13 DIAGNOSIS — F419 Anxiety disorder, unspecified: Secondary | ICD-10-CM | POA: Diagnosis not present

## 2022-11-14 ENCOUNTER — Other Ambulatory Visit: Payer: Self-pay

## 2022-11-14 MED ORDER — DULOXETINE HCL 30 MG PO CPEP: 60.0000 mg | ORAL_CAPSULE | Freq: Every day | ORAL | 3 refills | Status: DC

## 2022-11-14 MED ORDER — METOPROLOL TARTRATE 25 MG PO TABS: ORAL_TABLET | ORAL | 3 refills | Status: DC

## 2022-11-14 MED ORDER — ROSUVASTATIN CALCIUM 5 MG PO TABS: ORAL_TABLET | ORAL | 3 refills | Status: DC

## 2022-11-14 MED ORDER — BUSPIRONE HCL 5 MG PO TABS: 5.0000 mg | ORAL_TABLET | Freq: Two times a day (BID) | ORAL | 5 refills | Status: DC | PRN

## 2022-11-14 MED ORDER — DONEPEZIL HCL 5 MG PO TABS: 5.0000 mg | ORAL_TABLET | Freq: Every day | ORAL | 6 refills | Status: DC

## 2022-11-14 MED ORDER — OMEPRAZOLE 40 MG PO CPDR: DELAYED_RELEASE_CAPSULE | ORAL | 3 refills | Status: DC

## 2022-11-14 MED ORDER — FLECAINIDE ACETATE 100 MG PO TABS: 100.0000 mg | ORAL_TABLET | Freq: Two times a day (BID) | ORAL | 3 refills | Status: DC

## 2022-11-15 DIAGNOSIS — F419 Anxiety disorder, unspecified: Secondary | ICD-10-CM | POA: Diagnosis not present

## 2022-11-15 DIAGNOSIS — G3184 Mild cognitive impairment, so stated: Secondary | ICD-10-CM | POA: Diagnosis not present

## 2022-11-15 DIAGNOSIS — G4733 Obstructive sleep apnea (adult) (pediatric): Secondary | ICD-10-CM | POA: Diagnosis not present

## 2022-11-15 DIAGNOSIS — D6869 Other thrombophilia: Secondary | ICD-10-CM | POA: Diagnosis not present

## 2022-11-15 DIAGNOSIS — I480 Paroxysmal atrial fibrillation: Secondary | ICD-10-CM | POA: Diagnosis not present

## 2022-11-15 DIAGNOSIS — E042 Nontoxic multinodular goiter: Secondary | ICD-10-CM | POA: Diagnosis not present

## 2022-11-15 DIAGNOSIS — F3341 Major depressive disorder, recurrent, in partial remission: Secondary | ICD-10-CM | POA: Diagnosis not present

## 2022-11-15 DIAGNOSIS — I10 Essential (primary) hypertension: Secondary | ICD-10-CM | POA: Diagnosis not present

## 2022-11-15 DIAGNOSIS — G4700 Insomnia, unspecified: Secondary | ICD-10-CM | POA: Diagnosis not present

## 2022-11-15 DIAGNOSIS — I700 Atherosclerosis of aorta: Secondary | ICD-10-CM | POA: Diagnosis not present

## 2022-11-15 DIAGNOSIS — M1990 Unspecified osteoarthritis, unspecified site: Secondary | ICD-10-CM | POA: Diagnosis not present

## 2022-11-15 DIAGNOSIS — M48061 Spinal stenosis, lumbar region without neurogenic claudication: Secondary | ICD-10-CM | POA: Diagnosis not present

## 2022-11-15 DIAGNOSIS — M4802 Spinal stenosis, cervical region: Secondary | ICD-10-CM | POA: Diagnosis not present

## 2022-11-15 DIAGNOSIS — E785 Hyperlipidemia, unspecified: Secondary | ICD-10-CM | POA: Diagnosis not present

## 2022-11-15 DIAGNOSIS — K219 Gastro-esophageal reflux disease without esophagitis: Secondary | ICD-10-CM | POA: Diagnosis not present

## 2022-11-15 DIAGNOSIS — M810 Age-related osteoporosis without current pathological fracture: Secondary | ICD-10-CM | POA: Diagnosis not present

## 2022-11-19 DIAGNOSIS — M1990 Unspecified osteoarthritis, unspecified site: Secondary | ICD-10-CM | POA: Diagnosis not present

## 2022-11-19 DIAGNOSIS — G4733 Obstructive sleep apnea (adult) (pediatric): Secondary | ICD-10-CM | POA: Diagnosis not present

## 2022-11-19 DIAGNOSIS — F419 Anxiety disorder, unspecified: Secondary | ICD-10-CM | POA: Diagnosis not present

## 2022-11-19 DIAGNOSIS — I480 Paroxysmal atrial fibrillation: Secondary | ICD-10-CM | POA: Diagnosis not present

## 2022-11-19 DIAGNOSIS — M810 Age-related osteoporosis without current pathological fracture: Secondary | ICD-10-CM | POA: Diagnosis not present

## 2022-11-19 DIAGNOSIS — K219 Gastro-esophageal reflux disease without esophagitis: Secondary | ICD-10-CM | POA: Diagnosis not present

## 2022-11-19 DIAGNOSIS — G3184 Mild cognitive impairment, so stated: Secondary | ICD-10-CM | POA: Diagnosis not present

## 2022-11-19 DIAGNOSIS — M48061 Spinal stenosis, lumbar region without neurogenic claudication: Secondary | ICD-10-CM | POA: Diagnosis not present

## 2022-11-19 DIAGNOSIS — F3341 Major depressive disorder, recurrent, in partial remission: Secondary | ICD-10-CM | POA: Diagnosis not present

## 2022-11-19 DIAGNOSIS — D6869 Other thrombophilia: Secondary | ICD-10-CM | POA: Diagnosis not present

## 2022-11-19 DIAGNOSIS — I700 Atherosclerosis of aorta: Secondary | ICD-10-CM | POA: Diagnosis not present

## 2022-11-19 DIAGNOSIS — G4700 Insomnia, unspecified: Secondary | ICD-10-CM | POA: Diagnosis not present

## 2022-11-19 DIAGNOSIS — I10 Essential (primary) hypertension: Secondary | ICD-10-CM | POA: Diagnosis not present

## 2022-11-19 DIAGNOSIS — M4802 Spinal stenosis, cervical region: Secondary | ICD-10-CM | POA: Diagnosis not present

## 2022-11-19 DIAGNOSIS — E785 Hyperlipidemia, unspecified: Secondary | ICD-10-CM | POA: Diagnosis not present

## 2022-11-19 DIAGNOSIS — E042 Nontoxic multinodular goiter: Secondary | ICD-10-CM | POA: Diagnosis not present

## 2022-11-20 ENCOUNTER — Ambulatory Visit (HOSPITAL_COMMUNITY): Payer: Medicare Other | Admitting: Psychiatry

## 2022-11-20 DIAGNOSIS — M1990 Unspecified osteoarthritis, unspecified site: Secondary | ICD-10-CM | POA: Diagnosis not present

## 2022-11-20 DIAGNOSIS — G300 Alzheimer's disease with early onset: Secondary | ICD-10-CM

## 2022-11-20 DIAGNOSIS — E785 Hyperlipidemia, unspecified: Secondary | ICD-10-CM | POA: Diagnosis not present

## 2022-11-20 DIAGNOSIS — F0282 Dementia in other diseases classified elsewhere, unspecified severity, with psychotic disturbance: Secondary | ICD-10-CM

## 2022-11-20 DIAGNOSIS — M4802 Spinal stenosis, cervical region: Secondary | ICD-10-CM | POA: Diagnosis not present

## 2022-11-20 DIAGNOSIS — I10 Essential (primary) hypertension: Secondary | ICD-10-CM | POA: Diagnosis not present

## 2022-11-20 DIAGNOSIS — I700 Atherosclerosis of aorta: Secondary | ICD-10-CM | POA: Diagnosis not present

## 2022-11-20 DIAGNOSIS — G4700 Insomnia, unspecified: Secondary | ICD-10-CM | POA: Diagnosis not present

## 2022-11-20 DIAGNOSIS — D6869 Other thrombophilia: Secondary | ICD-10-CM | POA: Diagnosis not present

## 2022-11-20 DIAGNOSIS — F3341 Major depressive disorder, recurrent, in partial remission: Secondary | ICD-10-CM | POA: Diagnosis not present

## 2022-11-20 DIAGNOSIS — M810 Age-related osteoporosis without current pathological fracture: Secondary | ICD-10-CM | POA: Diagnosis not present

## 2022-11-20 DIAGNOSIS — F419 Anxiety disorder, unspecified: Secondary | ICD-10-CM | POA: Diagnosis not present

## 2022-11-20 DIAGNOSIS — E042 Nontoxic multinodular goiter: Secondary | ICD-10-CM | POA: Diagnosis not present

## 2022-11-20 DIAGNOSIS — K219 Gastro-esophageal reflux disease without esophagitis: Secondary | ICD-10-CM | POA: Diagnosis not present

## 2022-11-20 DIAGNOSIS — G4733 Obstructive sleep apnea (adult) (pediatric): Secondary | ICD-10-CM | POA: Diagnosis not present

## 2022-11-20 DIAGNOSIS — M48061 Spinal stenosis, lumbar region without neurogenic claudication: Secondary | ICD-10-CM | POA: Diagnosis not present

## 2022-11-20 DIAGNOSIS — I480 Paroxysmal atrial fibrillation: Secondary | ICD-10-CM | POA: Diagnosis not present

## 2022-11-20 DIAGNOSIS — G3184 Mild cognitive impairment, so stated: Secondary | ICD-10-CM | POA: Diagnosis not present
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
## 2022-11-25 DIAGNOSIS — M48061 Spinal stenosis, lumbar region without neurogenic claudication: Secondary | ICD-10-CM | POA: Diagnosis not present

## 2022-11-25 DIAGNOSIS — F419 Anxiety disorder, unspecified: Secondary | ICD-10-CM | POA: Diagnosis not present

## 2022-11-25 DIAGNOSIS — F3341 Major depressive disorder, recurrent, in partial remission: Secondary | ICD-10-CM | POA: Diagnosis not present

## 2022-11-25 DIAGNOSIS — G4733 Obstructive sleep apnea (adult) (pediatric): Secondary | ICD-10-CM | POA: Diagnosis not present

## 2022-11-25 DIAGNOSIS — I10 Essential (primary) hypertension: Secondary | ICD-10-CM | POA: Diagnosis not present

## 2022-11-25 DIAGNOSIS — D6869 Other thrombophilia: Secondary | ICD-10-CM | POA: Diagnosis not present

## 2022-11-25 DIAGNOSIS — M1990 Unspecified osteoarthritis, unspecified site: Secondary | ICD-10-CM | POA: Diagnosis not present

## 2022-11-25 DIAGNOSIS — I700 Atherosclerosis of aorta: Secondary | ICD-10-CM | POA: Diagnosis not present

## 2022-11-25 DIAGNOSIS — M4802 Spinal stenosis, cervical region: Secondary | ICD-10-CM | POA: Diagnosis not present

## 2022-11-25 DIAGNOSIS — K219 Gastro-esophageal reflux disease without esophagitis: Secondary | ICD-10-CM | POA: Diagnosis not present

## 2022-11-25 DIAGNOSIS — E785 Hyperlipidemia, unspecified: Secondary | ICD-10-CM | POA: Diagnosis not present

## 2022-11-25 DIAGNOSIS — G4700 Insomnia, unspecified: Secondary | ICD-10-CM | POA: Diagnosis not present

## 2022-11-25 DIAGNOSIS — E042 Nontoxic multinodular goiter: Secondary | ICD-10-CM | POA: Diagnosis not present

## 2022-11-25 DIAGNOSIS — G3184 Mild cognitive impairment, so stated: Secondary | ICD-10-CM | POA: Diagnosis not present

## 2022-11-25 DIAGNOSIS — M810 Age-related osteoporosis without current pathological fracture: Secondary | ICD-10-CM | POA: Diagnosis not present

## 2022-11-25 DIAGNOSIS — I480 Paroxysmal atrial fibrillation: Secondary | ICD-10-CM | POA: Diagnosis not present

## 2022-11-26 DIAGNOSIS — I872 Venous insufficiency (chronic) (peripheral): Secondary | ICD-10-CM | POA: Diagnosis not present

## 2022-11-27 DIAGNOSIS — I700 Atherosclerosis of aorta: Secondary | ICD-10-CM | POA: Diagnosis not present

## 2022-11-27 DIAGNOSIS — M810 Age-related osteoporosis without current pathological fracture: Secondary | ICD-10-CM | POA: Diagnosis not present

## 2022-11-27 DIAGNOSIS — G4700 Insomnia, unspecified: Secondary | ICD-10-CM | POA: Diagnosis not present

## 2022-11-27 DIAGNOSIS — Z853 Personal history of malignant neoplasm of breast: Secondary | ICD-10-CM | POA: Diagnosis not present

## 2022-11-27 DIAGNOSIS — E785 Hyperlipidemia, unspecified: Secondary | ICD-10-CM | POA: Diagnosis not present

## 2022-11-27 DIAGNOSIS — Z9181 History of falling: Secondary | ICD-10-CM | POA: Diagnosis not present

## 2022-11-27 DIAGNOSIS — D6869 Other thrombophilia: Secondary | ICD-10-CM | POA: Diagnosis not present

## 2022-11-27 DIAGNOSIS — M48061 Spinal stenosis, lumbar region without neurogenic claudication: Secondary | ICD-10-CM | POA: Diagnosis not present

## 2022-11-27 DIAGNOSIS — F419 Anxiety disorder, unspecified: Secondary | ICD-10-CM | POA: Diagnosis not present

## 2022-11-27 DIAGNOSIS — G4733 Obstructive sleep apnea (adult) (pediatric): Secondary | ICD-10-CM | POA: Diagnosis not present

## 2022-11-27 DIAGNOSIS — E042 Nontoxic multinodular goiter: Secondary | ICD-10-CM | POA: Diagnosis not present

## 2022-11-27 DIAGNOSIS — M4802 Spinal stenosis, cervical region: Secondary | ICD-10-CM | POA: Diagnosis not present

## 2022-11-27 DIAGNOSIS — I480 Paroxysmal atrial fibrillation: Secondary | ICD-10-CM | POA: Diagnosis not present

## 2022-11-27 DIAGNOSIS — Z86010 Personal history of colonic polyps: Secondary | ICD-10-CM | POA: Diagnosis not present

## 2022-11-27 DIAGNOSIS — M1990 Unspecified osteoarthritis, unspecified site: Secondary | ICD-10-CM | POA: Diagnosis not present

## 2022-11-27 DIAGNOSIS — G3184 Mild cognitive impairment, so stated: Secondary | ICD-10-CM | POA: Diagnosis not present

## 2022-11-27 DIAGNOSIS — K219 Gastro-esophageal reflux disease without esophagitis: Secondary | ICD-10-CM | POA: Diagnosis not present

## 2022-11-27 DIAGNOSIS — H269 Unspecified cataract: Secondary | ICD-10-CM | POA: Diagnosis not present

## 2022-11-27 DIAGNOSIS — I10 Essential (primary) hypertension: Secondary | ICD-10-CM | POA: Diagnosis not present

## 2022-11-27 DIAGNOSIS — F3341 Major depressive disorder, recurrent, in partial remission: Secondary | ICD-10-CM | POA: Diagnosis not present

## 2022-12-02 DIAGNOSIS — I700 Atherosclerosis of aorta: Secondary | ICD-10-CM | POA: Diagnosis not present

## 2022-12-02 DIAGNOSIS — G4700 Insomnia, unspecified: Secondary | ICD-10-CM | POA: Diagnosis not present

## 2022-12-02 DIAGNOSIS — M1990 Unspecified osteoarthritis, unspecified site: Secondary | ICD-10-CM | POA: Diagnosis not present

## 2022-12-02 DIAGNOSIS — F419 Anxiety disorder, unspecified: Secondary | ICD-10-CM | POA: Diagnosis not present

## 2022-12-02 DIAGNOSIS — G3184 Mild cognitive impairment, so stated: Secondary | ICD-10-CM | POA: Diagnosis not present

## 2022-12-02 DIAGNOSIS — E042 Nontoxic multinodular goiter: Secondary | ICD-10-CM | POA: Diagnosis not present

## 2022-12-02 DIAGNOSIS — M4802 Spinal stenosis, cervical region: Secondary | ICD-10-CM | POA: Diagnosis not present

## 2022-12-02 DIAGNOSIS — M48061 Spinal stenosis, lumbar region without neurogenic claudication: Secondary | ICD-10-CM | POA: Diagnosis not present

## 2022-12-02 DIAGNOSIS — I10 Essential (primary) hypertension: Secondary | ICD-10-CM | POA: Diagnosis not present

## 2022-12-02 DIAGNOSIS — K219 Gastro-esophageal reflux disease without esophagitis: Secondary | ICD-10-CM | POA: Diagnosis not present

## 2022-12-02 DIAGNOSIS — E785 Hyperlipidemia, unspecified: Secondary | ICD-10-CM | POA: Diagnosis not present

## 2022-12-02 DIAGNOSIS — D6869 Other thrombophilia: Secondary | ICD-10-CM | POA: Diagnosis not present

## 2022-12-02 DIAGNOSIS — M810 Age-related osteoporosis without current pathological fracture: Secondary | ICD-10-CM | POA: Diagnosis not present

## 2022-12-02 DIAGNOSIS — I480 Paroxysmal atrial fibrillation: Secondary | ICD-10-CM | POA: Diagnosis not present

## 2022-12-02 DIAGNOSIS — G4733 Obstructive sleep apnea (adult) (pediatric): Secondary | ICD-10-CM | POA: Diagnosis not present

## 2022-12-02 DIAGNOSIS — F3341 Major depressive disorder, recurrent, in partial remission: Secondary | ICD-10-CM | POA: Diagnosis not present

## 2022-12-05 ENCOUNTER — Ambulatory Visit: Payer: Medicare Other | Admitting: Neurology

## 2022-12-06 DIAGNOSIS — I10 Essential (primary) hypertension: Secondary | ICD-10-CM | POA: Diagnosis not present

## 2022-12-06 DIAGNOSIS — F419 Anxiety disorder, unspecified: Secondary | ICD-10-CM | POA: Diagnosis not present

## 2022-12-06 DIAGNOSIS — E042 Nontoxic multinodular goiter: Secondary | ICD-10-CM | POA: Diagnosis not present

## 2022-12-06 DIAGNOSIS — M1990 Unspecified osteoarthritis, unspecified site: Secondary | ICD-10-CM | POA: Diagnosis not present

## 2022-12-06 DIAGNOSIS — I700 Atherosclerosis of aorta: Secondary | ICD-10-CM | POA: Diagnosis not present

## 2022-12-06 DIAGNOSIS — M4802 Spinal stenosis, cervical region: Secondary | ICD-10-CM | POA: Diagnosis not present

## 2022-12-06 DIAGNOSIS — M48061 Spinal stenosis, lumbar region without neurogenic claudication: Secondary | ICD-10-CM | POA: Diagnosis not present

## 2022-12-06 DIAGNOSIS — F3341 Major depressive disorder, recurrent, in partial remission: Secondary | ICD-10-CM | POA: Diagnosis not present

## 2022-12-06 DIAGNOSIS — G4733 Obstructive sleep apnea (adult) (pediatric): Secondary | ICD-10-CM | POA: Diagnosis not present

## 2022-12-06 DIAGNOSIS — I480 Paroxysmal atrial fibrillation: Secondary | ICD-10-CM | POA: Diagnosis not present

## 2022-12-06 DIAGNOSIS — D6869 Other thrombophilia: Secondary | ICD-10-CM | POA: Diagnosis not present

## 2022-12-06 DIAGNOSIS — K219 Gastro-esophageal reflux disease without esophagitis: Secondary | ICD-10-CM | POA: Diagnosis not present

## 2022-12-06 DIAGNOSIS — E785 Hyperlipidemia, unspecified: Secondary | ICD-10-CM | POA: Diagnosis not present

## 2022-12-06 DIAGNOSIS — G3184 Mild cognitive impairment, so stated: Secondary | ICD-10-CM | POA: Diagnosis not present

## 2022-12-06 DIAGNOSIS — M810 Age-related osteoporosis without current pathological fracture: Secondary | ICD-10-CM | POA: Diagnosis not present

## 2022-12-06 DIAGNOSIS — G4700 Insomnia, unspecified: Secondary | ICD-10-CM | POA: Diagnosis not present

## 2022-12-09 IMAGING — DX DG KNEE AP/LAT W/ SUNRISE*R*
3 series · 3 of 3 positions shown · non-contrast
Comparison: Right knee radiographs 11/05/2021

CLINICAL DATA: Right knee pain.  Chronic.  No known injury.

EXAM:
RIGHT KNEE 3 VIEWS

[knee ap]
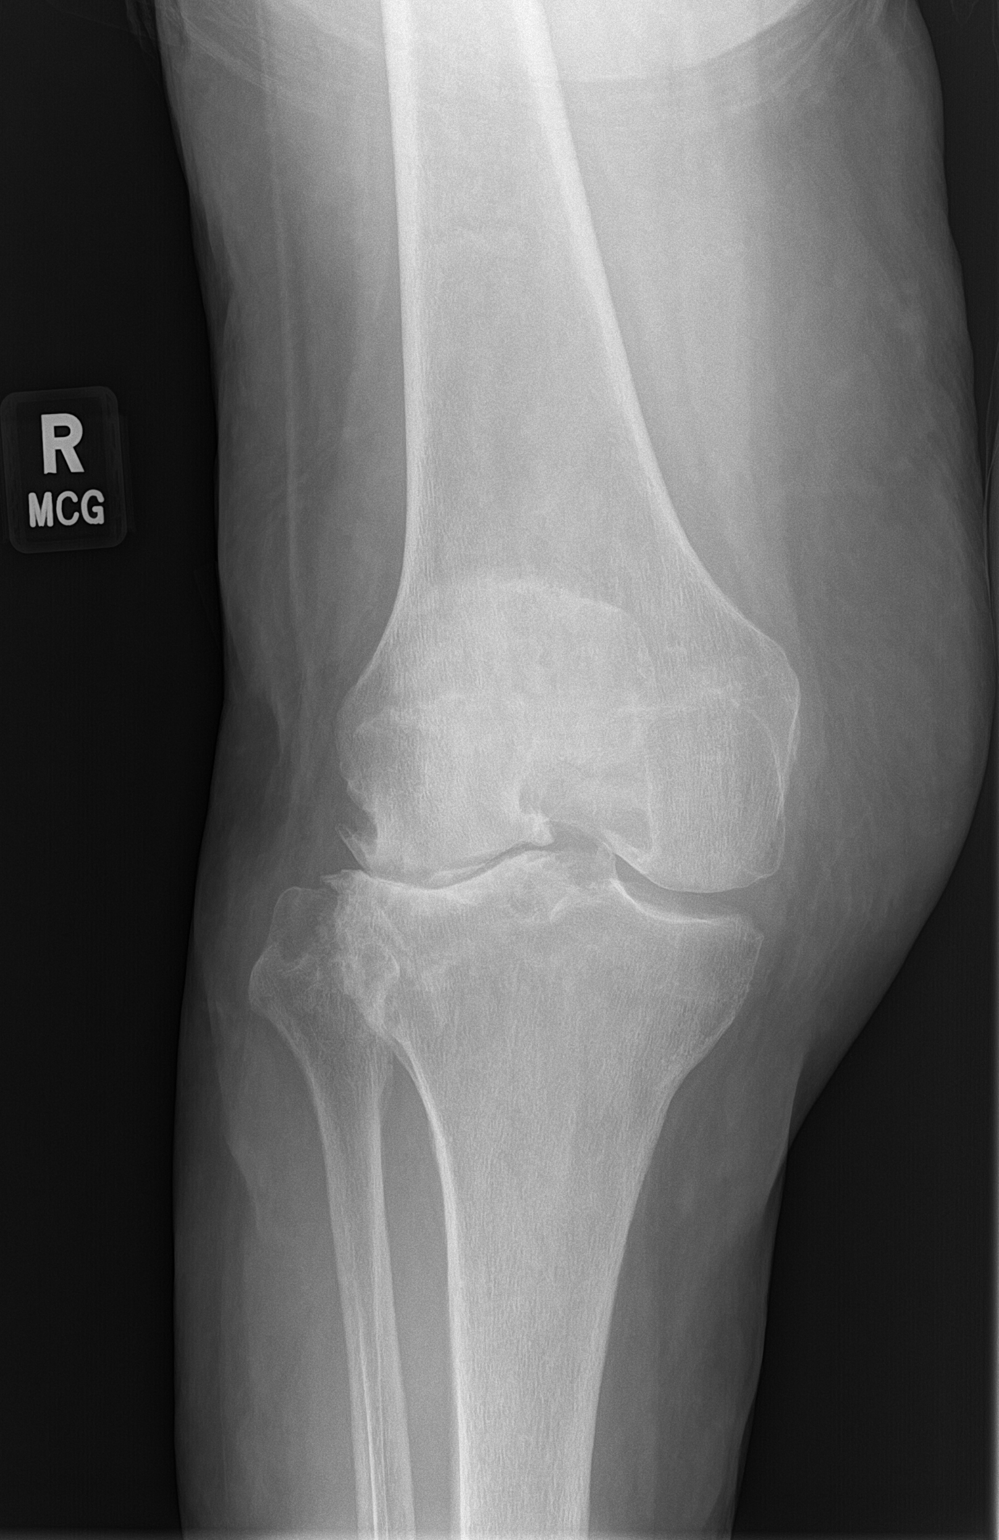

[knee lat]
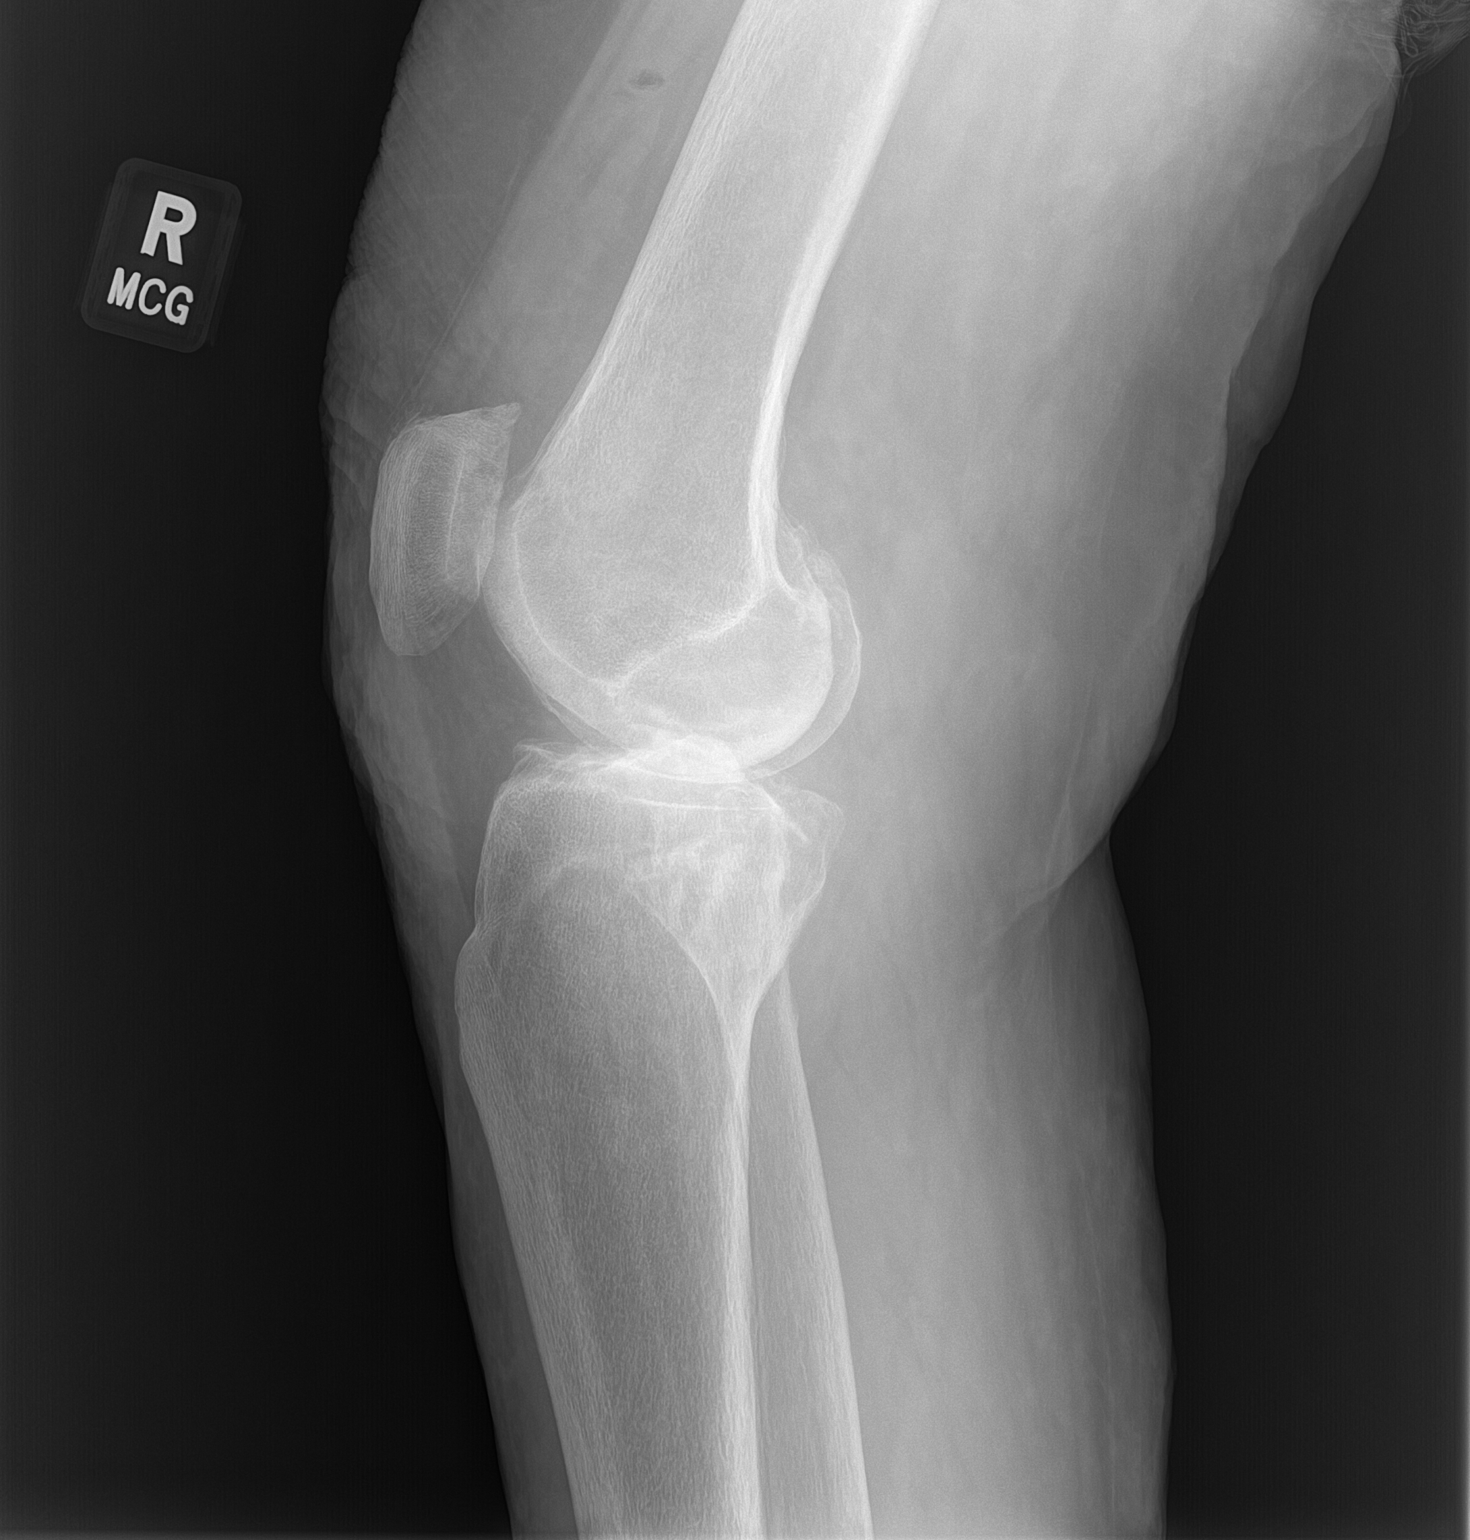

[patella]
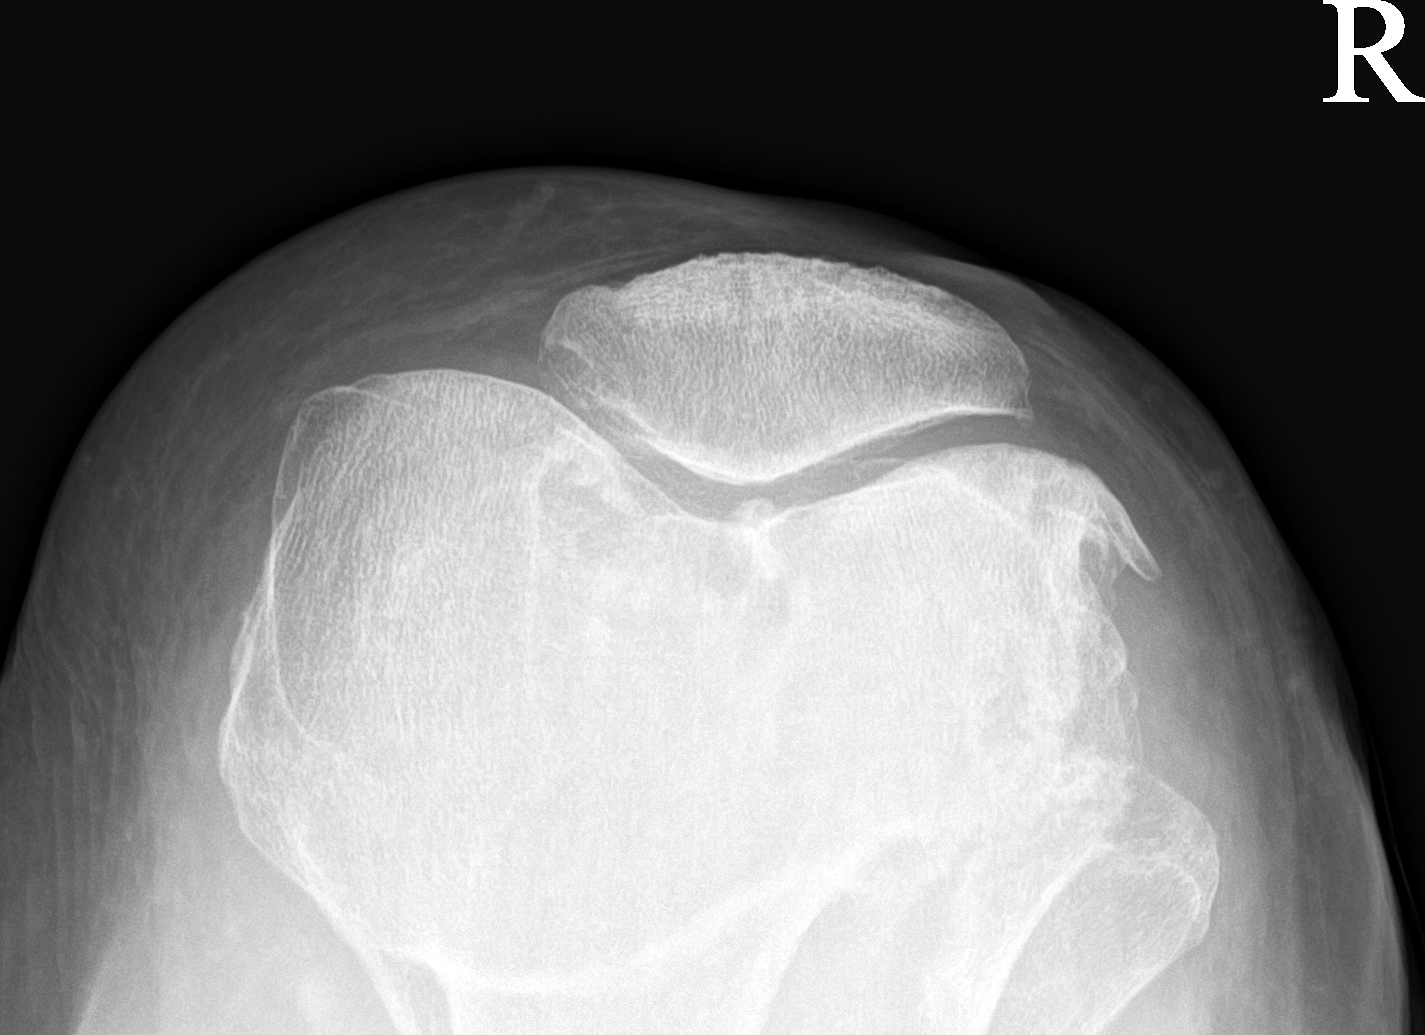

[3 of 3 positions shown; findings below may reference images not displayed]

FINDINGS: Severe lateral compartment joint space narrowing with bone-on-bone
contact and large peripheral degenerative osteophytes. Moderate
peripheral patellar degenerative osteophytosis. Mild patellofemoral
joint space narrowing. Moderate peripheral lateral trochlear
degenerative osteophytosis.

No acute fracture or dislocation.

On lateral view there is a small joint effusion with tiny focus of
air within the nondependent aspect of the joint fluid. Recommend
clinical correlation for possible recent injection accounting for
this air.
IMPRESSION: 1. Severe lateral and mild-to-moderate patellofemoral compartment
osteoarthritis.
2. Small joint effusion. There is a small focus of nondependent air
within the suprapatellar joint space. Note is made of
ultrasound-guided bilateral knee injections documented on 03/13/2022
[DATE] p.m. ultrasound on PACS. The current radiographs were performed
03/13/2022 at [DATE] p.m. This air is presumably related to the recent
right knee injection. Recommend clinical correlation.

## 2022-12-11 DIAGNOSIS — I700 Atherosclerosis of aorta: Secondary | ICD-10-CM | POA: Diagnosis not present

## 2022-12-11 DIAGNOSIS — F419 Anxiety disorder, unspecified: Secondary | ICD-10-CM | POA: Diagnosis not present

## 2022-12-11 DIAGNOSIS — G4733 Obstructive sleep apnea (adult) (pediatric): Secondary | ICD-10-CM | POA: Diagnosis not present

## 2022-12-11 DIAGNOSIS — M810 Age-related osteoporosis without current pathological fracture: Secondary | ICD-10-CM | POA: Diagnosis not present

## 2022-12-11 DIAGNOSIS — E785 Hyperlipidemia, unspecified: Secondary | ICD-10-CM | POA: Diagnosis not present

## 2022-12-11 DIAGNOSIS — D6869 Other thrombophilia: Secondary | ICD-10-CM | POA: Diagnosis not present

## 2022-12-11 DIAGNOSIS — M4802 Spinal stenosis, cervical region: Secondary | ICD-10-CM | POA: Diagnosis not present

## 2022-12-11 DIAGNOSIS — M48061 Spinal stenosis, lumbar region without neurogenic claudication: Secondary | ICD-10-CM | POA: Diagnosis not present

## 2022-12-11 DIAGNOSIS — I480 Paroxysmal atrial fibrillation: Secondary | ICD-10-CM | POA: Diagnosis not present

## 2022-12-11 DIAGNOSIS — I10 Essential (primary) hypertension: Secondary | ICD-10-CM | POA: Diagnosis not present

## 2022-12-11 DIAGNOSIS — G4700 Insomnia, unspecified: Secondary | ICD-10-CM | POA: Diagnosis not present

## 2022-12-11 DIAGNOSIS — M1990 Unspecified osteoarthritis, unspecified site: Secondary | ICD-10-CM | POA: Diagnosis not present

## 2022-12-11 DIAGNOSIS — G3184 Mild cognitive impairment, so stated: Secondary | ICD-10-CM | POA: Diagnosis not present

## 2022-12-11 DIAGNOSIS — E042 Nontoxic multinodular goiter: Secondary | ICD-10-CM | POA: Diagnosis not present

## 2022-12-11 DIAGNOSIS — F3341 Major depressive disorder, recurrent, in partial remission: Secondary | ICD-10-CM | POA: Diagnosis not present

## 2022-12-11 DIAGNOSIS — K219 Gastro-esophageal reflux disease without esophagitis: Secondary | ICD-10-CM | POA: Diagnosis not present

## 2022-12-13 DIAGNOSIS — G4733 Obstructive sleep apnea (adult) (pediatric): Secondary | ICD-10-CM | POA: Diagnosis not present

## 2022-12-13 DIAGNOSIS — E785 Hyperlipidemia, unspecified: Secondary | ICD-10-CM | POA: Diagnosis not present

## 2022-12-13 DIAGNOSIS — M48061 Spinal stenosis, lumbar region without neurogenic claudication: Secondary | ICD-10-CM | POA: Diagnosis not present

## 2022-12-13 DIAGNOSIS — I700 Atherosclerosis of aorta: Secondary | ICD-10-CM | POA: Diagnosis not present

## 2022-12-13 DIAGNOSIS — E042 Nontoxic multinodular goiter: Secondary | ICD-10-CM | POA: Diagnosis not present

## 2022-12-13 DIAGNOSIS — I10 Essential (primary) hypertension: Secondary | ICD-10-CM | POA: Diagnosis not present

## 2022-12-13 DIAGNOSIS — M1990 Unspecified osteoarthritis, unspecified site: Secondary | ICD-10-CM | POA: Diagnosis not present

## 2022-12-13 DIAGNOSIS — I480 Paroxysmal atrial fibrillation: Secondary | ICD-10-CM | POA: Diagnosis not present

## 2022-12-13 DIAGNOSIS — D6869 Other thrombophilia: Secondary | ICD-10-CM | POA: Diagnosis not present

## 2022-12-13 DIAGNOSIS — M4802 Spinal stenosis, cervical region: Secondary | ICD-10-CM | POA: Diagnosis not present

## 2022-12-13 DIAGNOSIS — M810 Age-related osteoporosis without current pathological fracture: Secondary | ICD-10-CM | POA: Diagnosis not present

## 2022-12-13 DIAGNOSIS — F419 Anxiety disorder, unspecified: Secondary | ICD-10-CM | POA: Diagnosis not present

## 2022-12-13 DIAGNOSIS — K219 Gastro-esophageal reflux disease without esophagitis: Secondary | ICD-10-CM | POA: Diagnosis not present

## 2022-12-13 DIAGNOSIS — F3341 Major depressive disorder, recurrent, in partial remission: Secondary | ICD-10-CM | POA: Diagnosis not present

## 2022-12-13 DIAGNOSIS — G4700 Insomnia, unspecified: Secondary | ICD-10-CM | POA: Diagnosis not present

## 2022-12-13 DIAGNOSIS — G3184 Mild cognitive impairment, so stated: Secondary | ICD-10-CM | POA: Diagnosis not present

## 2022-12-18 ENCOUNTER — Telehealth: Payer: Self-pay | Admitting: Family Medicine

## 2022-12-18 DIAGNOSIS — G4700 Insomnia, unspecified: Secondary | ICD-10-CM | POA: Diagnosis not present

## 2022-12-18 DIAGNOSIS — M4802 Spinal stenosis, cervical region: Secondary | ICD-10-CM | POA: Diagnosis not present

## 2022-12-18 DIAGNOSIS — G3184 Mild cognitive impairment, so stated: Secondary | ICD-10-CM | POA: Diagnosis not present

## 2022-12-18 DIAGNOSIS — I700 Atherosclerosis of aorta: Secondary | ICD-10-CM | POA: Diagnosis not present

## 2022-12-18 DIAGNOSIS — M1990 Unspecified osteoarthritis, unspecified site: Secondary | ICD-10-CM | POA: Diagnosis not present

## 2022-12-18 DIAGNOSIS — F3341 Major depressive disorder, recurrent, in partial remission: Secondary | ICD-10-CM | POA: Diagnosis not present

## 2022-12-18 DIAGNOSIS — M48061 Spinal stenosis, lumbar region without neurogenic claudication: Secondary | ICD-10-CM | POA: Diagnosis not present

## 2022-12-18 DIAGNOSIS — G4733 Obstructive sleep apnea (adult) (pediatric): Secondary | ICD-10-CM | POA: Diagnosis not present

## 2022-12-18 DIAGNOSIS — I480 Paroxysmal atrial fibrillation: Secondary | ICD-10-CM | POA: Diagnosis not present

## 2022-12-18 DIAGNOSIS — F419 Anxiety disorder, unspecified: Secondary | ICD-10-CM | POA: Diagnosis not present

## 2022-12-18 DIAGNOSIS — K219 Gastro-esophageal reflux disease without esophagitis: Secondary | ICD-10-CM | POA: Diagnosis not present

## 2022-12-18 DIAGNOSIS — D6869 Other thrombophilia: Secondary | ICD-10-CM | POA: Diagnosis not present

## 2022-12-18 DIAGNOSIS — I10 Essential (primary) hypertension: Secondary | ICD-10-CM | POA: Diagnosis not present

## 2022-12-18 DIAGNOSIS — E042 Nontoxic multinodular goiter: Secondary | ICD-10-CM | POA: Diagnosis not present

## 2022-12-18 DIAGNOSIS — M810 Age-related osteoporosis without current pathological fracture: Secondary | ICD-10-CM | POA: Diagnosis not present

## 2022-12-18 DIAGNOSIS — E785 Hyperlipidemia, unspecified: Secondary | ICD-10-CM | POA: Diagnosis not present

## 2022-12-18 NOTE — Telephone Encounter (Signed)
Caller states: - Following OV patient had been experiencing fatigue when taking 1 mg of metoprolol tartrate in AM and PM daily - Due to this, she informed patient to take half a pill in the AM and PM instead.  - New regimen she advised has been helping patient with fatigue, but patient is now getting confused when it comes to splitting the pills   Caller requests: -PCP send in medication for metoprolol 25 mg half tablet twice daily   Please Advise.

## 2022-12-19 NOTE — Telephone Encounter (Signed)
Yes thanks can send in metoprolol 25 mg-take half tablet twice daily #90 with 3 refills

## 2022-12-19 NOTE — Telephone Encounter (Signed)
See below, ok to send in change?

## 2022-12-20 MED ORDER — METOPROLOL SUCCINATE ER 25 MG PO TB24: ORAL_TABLET | ORAL | 3 refills | Status: DC

## 2022-12-20 NOTE — Telephone Encounter (Signed)
Rx sent to pharmacy   

## 2022-12-23 ENCOUNTER — Telehealth: Payer: Self-pay | Admitting: Family Medicine

## 2022-12-23 ENCOUNTER — Other Ambulatory Visit: Payer: Self-pay

## 2022-12-23 MED ORDER — FLECAINIDE ACETATE 100 MG PO TABS: ORAL_TABLET | ORAL | 3 refills | Status: DC

## 2022-12-23 NOTE — Telephone Encounter (Signed)
Caller states: - While in hospital, flecainide 100 mg  instructions were changed from 1 tab BID to half a tablet in AM and PM due to low BP  - Requests instructions be changed to half a tablet in AM and PM and faxed over to pharmacy so it can be applied to RX.

## 2022-12-23 NOTE — Telephone Encounter (Signed)
Do you know which pharmacy?

## 2022-12-24 DIAGNOSIS — M810 Age-related osteoporosis without current pathological fracture: Secondary | ICD-10-CM | POA: Diagnosis not present

## 2022-12-24 DIAGNOSIS — G4700 Insomnia, unspecified: Secondary | ICD-10-CM | POA: Diagnosis not present

## 2022-12-24 DIAGNOSIS — G3184 Mild cognitive impairment, so stated: Secondary | ICD-10-CM | POA: Diagnosis not present

## 2022-12-24 DIAGNOSIS — M4802 Spinal stenosis, cervical region: Secondary | ICD-10-CM | POA: Diagnosis not present

## 2022-12-24 DIAGNOSIS — D6869 Other thrombophilia: Secondary | ICD-10-CM | POA: Diagnosis not present

## 2022-12-24 DIAGNOSIS — I480 Paroxysmal atrial fibrillation: Secondary | ICD-10-CM | POA: Diagnosis not present

## 2022-12-24 DIAGNOSIS — M1990 Unspecified osteoarthritis, unspecified site: Secondary | ICD-10-CM | POA: Diagnosis not present

## 2022-12-24 DIAGNOSIS — E785 Hyperlipidemia, unspecified: Secondary | ICD-10-CM | POA: Diagnosis not present

## 2022-12-24 DIAGNOSIS — F3341 Major depressive disorder, recurrent, in partial remission: Secondary | ICD-10-CM | POA: Diagnosis not present

## 2022-12-24 DIAGNOSIS — K219 Gastro-esophageal reflux disease without esophagitis: Secondary | ICD-10-CM | POA: Diagnosis not present

## 2022-12-24 DIAGNOSIS — I10 Essential (primary) hypertension: Secondary | ICD-10-CM | POA: Diagnosis not present

## 2022-12-24 DIAGNOSIS — F419 Anxiety disorder, unspecified: Secondary | ICD-10-CM | POA: Diagnosis not present

## 2022-12-24 DIAGNOSIS — E042 Nontoxic multinodular goiter: Secondary | ICD-10-CM | POA: Diagnosis not present

## 2022-12-24 DIAGNOSIS — I700 Atherosclerosis of aorta: Secondary | ICD-10-CM | POA: Diagnosis not present

## 2022-12-24 DIAGNOSIS — G4733 Obstructive sleep apnea (adult) (pediatric): Secondary | ICD-10-CM | POA: Diagnosis not present

## 2022-12-24 DIAGNOSIS — M48061 Spinal stenosis, lumbar region without neurogenic claudication: Secondary | ICD-10-CM | POA: Diagnosis not present

## 2022-12-27 MED ORDER — METOPROLOL SUCCINATE ER 25 MG PO TB24: ORAL_TABLET | ORAL | 3 refills | Status: DC

## 2022-12-27 MED ORDER — FLECAINIDE ACETATE 100 MG PO TABS: 100.0000 mg | ORAL_TABLET | Freq: Two times a day (BID) | ORAL | 3 refills | Status: DC

## 2022-12-27 NOTE — Telephone Encounter (Signed)
Caller states [facility]. Please change.

## 2022-12-27 NOTE — Telephone Encounter (Signed)
Rx sent to requested pharmacy below. 

## 2022-12-27 NOTE — Telephone Encounter (Signed)
Medication has been updated and sent to requested pharmacy.

## 2022-12-27 NOTE — Addendum Note (Signed)
Addended by: Wyvonna Plum on: 12/27/2022 03:30 PM   Modules accepted: Orders

## 2022-12-27 NOTE — Addendum Note (Signed)
Addended by: Wyvonna Plum on: 12/27/2022 03:32 PM   Modules accepted: Orders

## 2022-12-27 NOTE — Telephone Encounter (Signed)
Caller states she got medications confused. States flecainide 100 mg dosage should be what it was before, 1 tablet BID. Also needs to be sent to [facility] pharmacy.

## 2022-12-29 ENCOUNTER — Other Ambulatory Visit: Payer: Self-pay | Admitting: Family Medicine

## 2023-01-07 ENCOUNTER — Ambulatory Visit: Payer: Medicare Other | Admitting: Family Medicine

## 2023-01-07 ENCOUNTER — Other Ambulatory Visit: Payer: Self-pay

## 2023-01-07 ENCOUNTER — Telehealth: Payer: Self-pay

## 2023-01-07 VITALS — BP 118/74 | Ht 64.0 in | Wt 141.0 lb

## 2023-01-07 DIAGNOSIS — G8929 Other chronic pain: Secondary | ICD-10-CM

## 2023-01-07 DIAGNOSIS — M1711 Unilateral primary osteoarthritis, right knee: Secondary | ICD-10-CM

## 2023-01-07 DIAGNOSIS — M25561 Pain in right knee: Secondary | ICD-10-CM | POA: Diagnosis not present

## 2023-01-07 NOTE — Progress Notes (Signed)
Bridget Goltz, PhD, LAT, ATC acting as a scribe for Bridget Leader, MD.  Bridget Lane is a 89 y.o. female who presents to [facility] today for cont'd right knee pain. Pt was last seen by Dr. Georgina Snell on 07/17/22 and was given a R knee Monovisc injection and a lumbar ESI was ordered, and later performed on 07/24/22. Today, pt reports only a few weeks of relief from prior R knee Monovisc injection. Pt is only having pain today in the R knee. Pt notes a "knot" in the posterior aspect of the R knee and has difficulty transitioning to stand.  She had reasonable benefit with Monovisc in the past.  Dx imaging: 03/13/22 R knee XR 11/05/21 R & L knee XR 07/19/20 R & L knee XR  Pertinent review of systems: No fevers or chills  Relevant historical information: Atrial fibrillation.  Alzheimer's.  Hypertension   Exam:  BP 118/74   Ht 5\' 4"  (1.626 m)   Wt 141 lb (64 kg)   BMI 24.20 kg/m  General: Well Developed, well nourished, and in no acute distress.   MSK: Right knee mild effusion normal motion with crepitation tender palpation medial joint line.    Lab and Radiology Results  Procedure: Real-time Ultrasound Guided Injection of right knee joint superior lateral patellar space Device: Philips Affiniti 50G Images permanently stored and available for review in PACS Verbal informed consent obtained.  Discussed risks and benefits of procedure. Warned about infection, bleeding, hyperglycemia damage to structures among others. Patient expresses understanding and agreement Time-out conducted.   Noted no overlying erythema, induration, or other signs of local infection.   Skin prepped in a sterile fashion.   Local anesthesia: Topical Ethyl chloride.   With sterile technique and under real time ultrasound guidance: 40 mg of Kenalog and 2 mL Marcaine injected into knee joint. Fluid seen entering the joint capsule.   Completed without difficulty   Pain immediately  resolved suggesting accurate placement of the medication.   Advised to call if fevers/chills, erythema, induration, drainage, or persistent bleeding.   Images permanently stored and available for review in the ultrasound unit.  Impression: Technically successful ultrasound guided injection.   EXAM: RIGHT KNEE 3 VIEWS   COMPARISON:  Right knee radiographs 11/05/2021   FINDINGS: Severe lateral compartment joint space narrowing with bone-on-bone contact and large peripheral degenerative osteophytes. Moderate peripheral patellar degenerative osteophytosis. Mild patellofemoral joint space narrowing. Moderate peripheral lateral trochlear degenerative osteophytosis.   No acute fracture or dislocation.   On lateral view there is a small joint effusion with tiny focus of air within the nondependent aspect of the joint fluid. Recommend clinical correlation for possible recent injection accounting for this air.   IMPRESSION: 1. Severe lateral and mild-to-moderate patellofemoral compartment osteoarthritis. 2. Small joint effusion. There is a small focus of nondependent air within the suprapatellar joint space. Note is made of ultrasound-guided bilateral knee injections documented on 03/13/2022 1:23 p.m. ultrasound on PACS. The current radiographs were performed 03/13/2022 at 1:43 p.m. This air is presumably related to the recent right knee injection. Recommend clinical correlation.     Electronically Signed   By: [name]   On: 03/14/2022 10:54 I, Bridget Lane, personally (independently) visualized and performed the interpretation of the images attached in this note.       Assessment and Plan: 89 y.o. female with right knee pain due to DJD.  This is an acute exacerbation of a chronic problem.  Plan for steroid injection today.  Will work on authorization now for hyaluronic acid injection 1 dose shot.  She had benefit from that in the past.  If today's cortisone injection  does not last longer than 3 months we can proceed with that.  Requested that she give [place] a little bit of warning before she comes in for the gel shot so that we can make sure that we have it authorized still and that we actually have the medicine that we got authorized in stock and are ready to go.  She will keep [place] updated with how things go. Not a great surgical candidate for knee replacement.  Avoid high-dose oral NSAIDs.  A-fib and hypertension.  PDMP not reviewed this encounter. Orders Placed This Encounter  Procedures   [place] LIMITED JOINT SPACE STRUCTURES LOW RIGHT(NO LINKED CHARGES)    Order Specific Question:   Reason for Exam (SYMPTOM  OR DIAGNOSIS REQUIRED)    Answer:   right knee pain    Order Specific Question:   Preferred imaging location?    Answer:   [place]   No orders of the defined types were placed in this encounter.    Discussed warning signs or symptoms. Please see discharge instructions. Patient expresses understanding.   The above documentation has been reviewed and is accurate and complete Bridget Lane, M.D.

## 2023-01-07 NOTE — Telephone Encounter (Signed)
Collins, Cheryll Dessert, Barnett S, CMA Please authorize SINGLE does gel shot for bilat knee.

## 2023-01-07 NOTE — Patient Instructions (Addendum)
Thank you for coming in today.   You received an injection today. Seek immediate medical attention if the joint becomes red, extremely painful, or is oozing fluid.   We will work to authorize gel shots for whenever your knee starts hurting again.  Let me know ahead of time.

## 2023-01-07 NOTE — Telephone Encounter (Signed)
VOB initiated for Monovisc - BILAT knee OA.

## 2023-01-08 NOTE — Telephone Encounter (Addendum)
Ready for ordering/scheduling  MONOVISC - BILAT Knee OA  Copay: $15 Co-insurance: 20% Deductible: does not apply  Prior Auth: NOT required  Last Monovisc inj 07/17/22 RIGHT knee Can consider repeat inj 01/16/23

## 2023-01-10 NOTE — Telephone Encounter (Signed)
Tried to call patient. No answer and VM is full.

## 2023-01-15 ENCOUNTER — Encounter (HOSPITAL_COMMUNITY): Payer: Self-pay

## 2023-01-15 ENCOUNTER — Emergency Department (HOSPITAL_COMMUNITY): Payer: Medicare Other

## 2023-01-15 ENCOUNTER — Emergency Department (HOSPITAL_COMMUNITY)
Admission: EM | Admit: 2023-01-15 | Discharge: 2023-01-15 | Disposition: A | Discharge status: Home / Self Care | Payer: Medicare Other | Attending: Emergency Medicine | Admitting: Emergency Medicine

## 2023-01-15 DIAGNOSIS — T887XXA Unspecified adverse effect of drug or medicament, initial encounter: Secondary | ICD-10-CM | POA: Insufficient documentation

## 2023-01-15 DIAGNOSIS — I959 Hypotension, unspecified: Secondary | ICD-10-CM | POA: Diagnosis not present

## 2023-01-15 DIAGNOSIS — N179 Acute kidney failure, unspecified: Secondary | ICD-10-CM | POA: Diagnosis not present

## 2023-01-15 DIAGNOSIS — R531 Weakness: Secondary | ICD-10-CM | POA: Diagnosis not present

## 2023-01-15 DIAGNOSIS — E860 Dehydration: Secondary | ICD-10-CM

## 2023-01-15 DIAGNOSIS — T50905A Adverse effect of unspecified drugs, medicaments and biological substances, initial encounter: Secondary | ICD-10-CM | POA: Diagnosis not present

## 2023-01-15 DIAGNOSIS — E875 Hyperkalemia: Secondary | ICD-10-CM | POA: Insufficient documentation

## 2023-01-15 DIAGNOSIS — R4182 Altered mental status, unspecified: Secondary | ICD-10-CM | POA: Diagnosis not present

## 2023-01-15 DIAGNOSIS — F0390 Unspecified dementia without behavioral disturbance: Secondary | ICD-10-CM | POA: Diagnosis not present

## 2023-01-15 DIAGNOSIS — R4781 Slurred speech: Secondary | ICD-10-CM | POA: Diagnosis not present

## 2023-01-15 DIAGNOSIS — R9431 Abnormal electrocardiogram [ECG] [EKG]: Secondary | ICD-10-CM | POA: Diagnosis not present

## 2023-01-15 DIAGNOSIS — R001 Bradycardia, unspecified: Secondary | ICD-10-CM | POA: Diagnosis not present

## 2023-01-15 DIAGNOSIS — R410 Disorientation, unspecified: Secondary | ICD-10-CM | POA: Diagnosis not present

## 2023-01-15 LAB — CBC WITH DIFFERENTIAL/PLATELET
Abs Immature Granulocytes: 0.04 10*3/uL (ref 0.00–0.07)
Basophils Absolute: 0.1 10*3/uL (ref 0.0–0.1)
Basophils Relative: 1 %
Eosinophils Absolute: 0.1 10*3/uL (ref 0.0–0.5)
Eosinophils Relative: 1 %
HCT: 34.5 % — ABNORMAL LOW (ref 36.0–46.0)
Hemoglobin: 11 g/dL — ABNORMAL LOW (ref 12.0–15.0)
Immature Granulocytes: 1 %
Lymphocytes Relative: 21 %
Lymphs Abs: 1.7 10*3/uL (ref 0.7–4.0)
MCH: 29.6 pg (ref 26.0–34.0)
MCHC: 31.9 g/dL (ref 30.0–36.0)
MCV: 92.7 fL (ref 80.0–100.0)
Monocytes Absolute: 0.5 10*3/uL (ref 0.1–1.0)
Monocytes Relative: 6 %
Neutro Abs: 5.9 10*3/uL (ref 1.7–7.7)
Neutrophils Relative %: 70 %
Platelets: 264 10*3/uL (ref 150–400)
RBC: 3.72 MIL/uL — ABNORMAL LOW (ref 3.87–5.11)
RDW: 12.8 % (ref 11.5–15.5)
WBC: 8.3 10*3/uL (ref 4.0–10.5)
nRBC: 0 % (ref 0.0–0.2)

## 2023-01-15 LAB — COMPREHENSIVE METABOLIC PANEL
ALT: 26 U/L (ref 0–44)
AST: 27 U/L (ref 15–41)
Albumin: 3.6 g/dL (ref 3.5–5.0)
Alkaline Phosphatase: 70 U/L (ref 38–126)
Anion gap: 9 (ref 5–15)
BUN: 49 mg/dL — ABNORMAL HIGH (ref 8–23)
CO2: 23 mmol/L (ref 22–32)
Calcium: 9.2 mg/dL (ref 8.9–10.3)
Chloride: 105 mmol/L (ref 98–111)
Creatinine, Ser: 1.97 mg/dL — ABNORMAL HIGH (ref 0.44–1.00)
GFR, Estimated: 24 mL/min — ABNORMAL LOW (ref >60)
Glucose, Bld: 87 mg/dL (ref 70–99)
Potassium: 5.3 mmol/L — ABNORMAL HIGH (ref 3.5–5.1)
Sodium: 137 mmol/L (ref 135–145)
Total Bilirubin: 0.6 mg/dL (ref 0.3–1.2)
Total Protein: 6.5 g/dL (ref 6.5–8.1)

## 2023-01-15 LAB — URINALYSIS, ROUTINE W REFLEX MICROSCOPIC
Bilirubin Urine: NEGATIVE
Glucose, UA: NEGATIVE mg/dL
Hgb urine dipstick: NEGATIVE
Ketones, ur: NEGATIVE mg/dL
Leukocytes,Ua: NEGATIVE
Nitrite: NEGATIVE
Protein, ur: NEGATIVE mg/dL
Specific Gravity, Urine: 1.015 (ref 1.005–1.030)
pH: 5 (ref 5.0–8.0)

## 2023-01-15 LAB — ETHANOL: Alcohol, Ethyl (B): <10 mg/dL (ref <10)

## 2023-01-15 LAB — T4, FREE: Free T4: 0.95 ng/dL (ref 0.61–1.12)

## 2023-01-15 LAB — TSH: TSH: 0.739 u[IU]/mL (ref 0.350–4.500)

## 2023-01-15 LAB — AMMONIA: Ammonia: 11 umol/L (ref 9–35)

## 2023-01-15 LAB — CBG MONITORING, ED: Glucose-Capillary: 80 mg/dL (ref 70–99)

## 2023-01-15 MED ORDER — SODIUM CHLORIDE 0.9 % IV BOLUS
1000.0000 mL | Freq: Once | INTRAVENOUS | Status: AC
  Administered 2023-01-15: 1000 mL via INTRAVENOUS

## 2023-01-15 NOTE — Discharge Instructions (Addendum)
Thank you for coming to [facility] Emergency Department. You were seen for concern for slurred speech and were found to have dry mouth. We did an exam, labs, and imaging, and these showed likely a side effect of benadryl. Please stay well hydrated and drink fluids at home. Please do not take more than one benadryl at a time.  Please follow up with your primary care provider within 1 week.   Do not hesitate to return to the ED or call 911 if you experience: -Worsening symptoms -Lightheadedness, passing out -Fevers/chills -Anything else that concerns you

## 2023-01-15 NOTE — ED Provider Notes (Signed)
[facility] Provider Note   CSN: [id number] Arrival date & time: 01/15/23  1550     History  Chief Complaint  Patient presents with   Altered Mental Status    Bridget Lane is a 89 y.o. female brought in by EMS for altered mental status.  Patient has a history of mild dementia per EMS.  Unknown last known well.  EMS reports that patient's family found the patient yesterday at home seeming a little bit confused with a bit of abnormal speech.  Her symptoms were persistent today so patient's family called to have the patient brought in.  EMS reports that the patient states it felt funny to walk however patient reports not having any ataxia or dizziness.  She has been taking increased Benadryl due to itchy legs and states that her mouth is extremely dry.  EMS reports that family says that she sometimes gets confused with her urinary tract infections.  No other complaints at this time, no difficulty swallowing.   Altered Mental Status      Home Medications Prior to Admission medications   Medication Sig Start Date End Date Taking? Authorizing Provider  acetaminophen (TYLENOL) 325 MG tablet Take 650 mg by mouth every 6 (six) hours as needed for mild pain, fever or headache.    [provider]  busPIRone (BUSPAR) 5 MG tablet Take 1 tablet (5 mg total) by mouth 2 (two) times daily as needed (Anxiety). 11/14/22   [name], MD  diphenhydrAMINE (BENADRYL) 50 MG capsule Take 50 mg by mouth every 6 (six) hours as needed for itching or allergies.    [provider]  donepezil (ARICEPT) 5 MG tablet Take 1 tablet (5 mg total) by mouth at bedtime. 11/14/22 06/12/23  [name], MD  flecainide (TAMBOCOR) 100 MG tablet Take 1 tablet (100 mg total) by mouth 2 (two) times daily. 12/27/22   [name], MD  metoprolol succinate (TOPROL-XL) 25 MG 24 hr tablet Take 1/2 tablet twice a day. 12/27/22   [name], MD   metoprolol tartrate (LOPRESSOR) 25 MG tablet TAKE 1/2 TABLET TWICE DAILY.HOLD FOR HEART RATE 55 12/30/22   [name], MD  Multiple Vitamins-Minerals (MULTI FOR HER 50+) TABS Take 1 tablet by mouth daily.    [provider]  Multiple Vitamins-Minerals (PRESERVISION AREDS 2+MULTI VIT PO) Take 1 tablet by mouth in the morning and at bedtime.    [provider]  omeprazole (PRILOSEC) 40 MG capsule TAKE 1 CAPSULE(40 MG) BY MOUTH TWICE DAILY 11/14/22   [name], MD  Polyethyl Glycol-Propyl Glycol 0.4-0.3 % SOLN Apply 1-2 drops to eye daily as needed (stye, dryness).    [provider]  QUEtiapine (SEROQUEL) 25 MG tablet Take 0.5 tablets (12.5 mg total) by mouth at bedtime. 11/05/22 12/05/22  Alric Ran, MD  rosuvastatin (CRESTOR) 5 MG tablet TAKE 1 TABLET(5 MG) BY MOUTH DAILY 11/14/22   [name], MD      Allergies    Penicillins, Codeine, Codeine, Dabigatran etexilate mesylate, Dilaudid [hydromorphone], Acetaminophen, Dabigatran etexilate mesylate, Dabigatran etexilate mesylate, Penicillins, Sulfa antibiotics, and Sulfonamide derivatives    Review of Systems   Review of Systems  Physical Exam Updated Vital Signs BP 119/71 (BP Location: Right Arm)   Pulse (!) 52   Temp 97.9 F (36.6 C) (Oral)   Resp 18   Ht 5\' 4"  (1.626 m)   Wt 64 kg   SpO2 98%  BMI 24.20 kg/m  Physical Exam Vitals and nursing note reviewed.  Constitutional:      General: She is not in acute distress.    Appearance: She is well-developed. She is not diaphoretic.  HENT:     Head: Normocephalic and atraumatic.     Right Ear: External ear normal.     Left Ear: External ear normal.     Nose: Nose normal.     Mouth/Throat:     Mouth: Mucous membranes are dry.  Eyes:     General: No scleral icterus.    Conjunctiva/sclera: Conjunctivae normal.  Cardiovascular:     Rate and Rhythm: Normal rate and regular rhythm.     Heart sounds: Normal heart sounds. No murmur  heard.    No friction rub. No gallop.  Pulmonary:     Effort: Pulmonary effort is normal. No respiratory distress.     Breath sounds: Normal breath sounds.  Abdominal:     General: Bowel sounds are normal. There is no distension.     Palpations: Abdomen is soft. There is no mass.     Tenderness: There is no abdominal tenderness. There is no guarding.  Musculoskeletal:     Cervical back: Normal range of motion.  Skin:    General: Skin is warm and dry.  Neurological:     Mental Status: She is alert and oriented to person, place, and time.     Comments: Speech is dysarthric versus abnormal due to very dry oral mucosa and goal oriented, follows commands Major Cranial nerves without deficit, no facial droop Normal strength in upper and lower extremities bilaterally including dorsiflexion and plantar flexion, strong and equal grip strength Sensation normal to light and sharp touch Moves extremities without ataxia, coordination intact Normal finger to nose and rapid alternating movements Neg romberg, no pronator drift Normal gait Normal heel-shin  Patient is intermittently confused in response  Psychiatric:        Behavior: Behavior normal.     ED Results / Procedures / Treatments   Labs (all labs ordered are listed, but only abnormal results are displayed) Labs Reviewed  CBC WITH DIFFERENTIAL/PLATELET  COMPREHENSIVE METABOLIC PANEL  URINALYSIS, ROUTINE W REFLEX MICROSCOPIC  AMMONIA  ETHANOL  TSH  T4, FREE  CBG MONITORING, ED    EKG None  Radiology DG Chest Port 1 View  Result Date: 01/15/2023 CLINICAL DATA:  Altered mental status. EXAM: PORTABLE CHEST 1 VIEW COMPARISON:  Chest CT 10/01/2021. FINDINGS: Nodular opacity in the right lower lung is favored to represent summation artifact. Unchanged scarring in the lingula. No consolidation. Stable cardiac and mediastinal contours. No pleural effusion or pneumothorax. IMPRESSION: No evidence of acute cardiopulmonary disease.  Electronically Signed   By: Emmit Alexanders M.D.   On: 01/15/2023 16:28    Procedures Procedures    Medications Ordered in ED Medications - No data to display  ED Course/ Medical Decision Making/ A&P Clinical Course as of 01/16/23 1349  Wed Jan 15, 2023  1709 Glucose-Capillary: 80 [HN]  1842 Labs demonstrate AKI and mild hyperkalemia [HN]  2047 Urinalysis, Routine w reflex microscopic -Urine, Clean Catch UA neg [HN]  2120 Patient reevaluated and she feels much improved.  Her urine is negative.  She does not have as much slurred speech as before.  She still has somewhat dry mucous membranes but would like to go home and orally rehydrate at home.  Family member at bedside states that she will ensure she does.  They will follow-up with primary  care.  I advised patient to take only 1 Benadryl instead of 2 at a time for her itching in her feet and to follow-up with primary care in the next week. DC w/ discharge instructions/return precautions. All questions answered to patient's satisfaction.   [HN]    Clinical Course User Index [HN] Audley Hose, MD                             Medical Decision Making 89 year old female here with altered mental status and speech abnormality. My initial impression of the patient is that she does have some mild confusion and that her mucous membranes are extremely dry and sticky which I believe is leading to her speech abnormality.  Patient is in agreement.  She does not appear to have any evidence of dysarthria or focal neurologic abnormality.  Additional history provided by the patient's daughter at bedside who states that she has been taking a lot of Benadryl for her itching legs and that sometimes Benadryl makes her get a little bit "loopy."  I ordered labs.  Urinalysis is currently pending however patient does appear to have a slight bump in her creatinine likely due to dehydration.  She has been repleted with IV fluids.  The remainder of her labs appear  to be at baseline.  I also ordered and individually interpreted CT head imaging which shows no acute findings.  I have given signout to Dr. Mayra Neer who will assume care of the patient for appropriate disposition after return of her urinalysis.  I suspect she would likely be safe for discharge and have recommended topical treatment for pruritus and xerosis over oral antihistamines.  Amount and/or Complexity of Data Reviewed Labs: ordered. Decision-making details documented in ED Course. Radiology: ordered.          Final Clinical Impression(s) / ED Diagnoses Final diagnoses:  Dehydration  Medication side effect  AKI (acute kidney injury) ([place])  Hyperkalemia    Rx / DC Orders ED Discharge Orders     None         Margarita Mail, PA-C 01/16/23 1353    Audley Hose, MD 01/18/23 [phone]

## 2023-01-15 NOTE — ED Triage Notes (Addendum)
Pt present to ED from home with altered mental status per the family. Per EMS, pt last well known time was yesterday at 16:30, pt stated to EMS onset of garbled speech yesterday at 16:30. Pt also c/o weakness for few weeks. Pt states to falling yesterday at home, denies LOC

## 2023-01-15 NOTE — ED Notes (Signed)
Pt wheeled to the bathroom, accompanied by daughter. Denies dizziness, no weakness noted.

## 2023-01-15 NOTE — ED Provider Notes (Signed)
Assumed care of patient from off-going team. For more details, please see note from same day.  In brief, this is a 89 y.o. female, for more details please see note from same day. Took two benadryl last night, had dry mouth concern for slurred speech. W/u reassuring including CTH except for AKI and mild hyperkalemia. Receiving fluids.  Plan/Dispo at time of sign-out & ED Course since sign-out: [ ]  UA  BP (!) 114/49   Pulse (!) 51   Temp 98 F (36.7 C)   Resp 18   Ht 5\' 4"  (1.626 m)   Wt 64 kg   SpO2 100%   BMI 24.20 kg/m    ED Course:   Clinical Course as of 01/15/23 2122  Wed Jan 15, 2023  1709 Glucose-Capillary: 80 [HN]  1842 Labs demonstrate AKI and mild hyperkalemia [HN]  2047 Urinalysis, Routine w reflex microscopic -Urine, Clean Catch UA neg [HN]  2120 Patient reevaluated and she feels much improved.  Her urine is negative.  She does not have as much slurred speech as before.  She still has somewhat dry mucous membranes but would like to go home and orally rehydrate at home.  Family member at bedside states that she will ensure she does.  They will follow-up with primary care.  I advised patient to take only 1 Benadryl instead of 2 at a time for her itching in her feet and to follow-up with primary care in the next week. DC w/ discharge instructions/return precautions. All questions answered to patient's satisfaction.   [HN]    Clinical Course User Index [HN] Audley Hose, MD    Dispo: DC ------------------------------- Cindee Lame, MD Emergency Medicine  This note was created using dictation software, which may contain spelling or grammatical errors.   Audley Hose, MD 01/15/23 2123

## 2023-01-20 ENCOUNTER — Telehealth: Payer: Self-pay | Admitting: *Deleted

## 2023-01-20 NOTE — Telephone Encounter (Signed)
        Patient  visited [facility] on 01/15/2023  for treatment    Telephone encounter attempt :  1st  [facility] [place] [phone] [address] , [place] [address] Email : [email or web address]

## 2023-01-21 NOTE — Telephone Encounter (Signed)
Called again, VM full. No recent MyChart use.

## 2023-01-22 ENCOUNTER — Telehealth: Payer: Self-pay

## 2023-01-22 NOTE — Transitions of Care (Post Inpatient/ED Visit) (Signed)
   01/22/2023  Name: Bridget Lane MRN: [id number] DOB: 03/29/1933  Today's TOC FU Call Status: Today's TOC FU Call Status:: Successful TOC FU Call Competed TOC FU Call Complete Date: 01/22/23  Transition Care Management Follow-up Telephone Call Date of Discharge: 12/17/22 [facility]) Type of Discharge: Emergency Department Reason for ED Visit: Other: ("dehydration") How have you been since you were released from the hospital?: Better (Call completed with daughter-[name]-srates pt currently gettig hair done. She is doing well-no further issues since returning home. She is drinking and eating well.) Any questions or concerns?: No Red on EMMI-ED Discharge Alert Date & Reason: 01/21/23 "Scheduled follow-up appt? No"  Items Reviewed: Did you receive and understand the discharge instructions provided?: Yes Medications obtained and verified?: No (unable to review meds with daughter at this time) Any new allergies since your discharge?: No Dietary orders reviewed?: NA Do you have support at home?: Yes People in Home: child(ren), adult Name of Support/Comfort Primary Source: [place] and Equipment/Supplies: [place] Ordered?: NA Any new equipment or medical supplies ordered?: NA  Functional Questionnaire: Do you need assistance with bathing/showering or dressing?: No Do you need assistance with meal preparation?: No Do you need assistance with eating?: No Do you have difficulty maintaining continence: No Do you need assistance with getting out of bed/getting out of a chair/moving?: No Do you have difficulty managing or taking your medications?: No  Follow up appointments reviewed: PCP Follow-up appointment confirmed?: No (RN CM offered to assist with scheduling appt-daughter prefers to call and make appt later when she has her calendar/schedule available) MD Provider Line Number:[phone] Given: No [facility]  Follow-up appointment confirmed?: NA Do you need transportation to your follow-up appointment?: No Do you understand care options if your condition(s) worsen?: Yes-patient verbalized understanding  SDOH Interventions Today    Flowsheet Row Most Recent Value  SDOH Interventions   Food Insecurity Interventions Intervention Not Indicated  Transportation Interventions Intervention Not Indicated      TOC Interventions Today    Flowsheet Row Most Recent Value  TOC Interventions   TOC Interventions Discussed/Reviewed TOC Interventions Discussed      Interventions Today    Flowsheet Row Most Recent Value  General Interventions   General Interventions Discussed/Reviewed General Interventions Discussed, Doctor Visits  Doctor Visits Discussed/Reviewed Doctor Visits Discussed, PCP  PCP/Specialist Visits Compliance with follow-up visit  Education Interventions   Education Provided Provided Education  Provided Verbal Education On When to see the doctor, Medication, Nutrition  Nutrition Interventions   Nutrition Discussed/Reviewed Nutrition Discussed  Pharmacy Interventions   Pharmacy Dicussed/Reviewed Pharmacy Topics Discussed  Safety Interventions   Safety Discussed/Reviewed Safety Discussed       Hetty Blend [facility] Care Management Care Management Community Coordinator Direct Phone: [phone] Toll Free: [phone] Fax: [phone]

## 2023-01-22 NOTE — Transitions of Care (Post Inpatient/ED Visit) (Deleted)
   01/22/2023  Name: Bridget Lane MRN: [id number] DOB: [birth year]  Today's TOC FU Call Status: Today's TOC FU Call Status:: Successful TOC FU Call Competed TOC FU Call Complete Date: 01/22/23  Transition Care Management Follow-up Telephone Call Date of Discharge: 12/17/22 [facility]charge [facility]: [facility]) Type of Discharge: Emergency Department Reason for ED Visit: Other: ("dehydration") How have you been since you were released from the hospital?: Better (Call completed with daughter-[name]-srates pt currently gettig hair done. She is doing well-no further issues since returning home. She is drinking and eating well.) Any questions or concerns?: No  Items Reviewed: Did you receive and understand the discharge instructions provided?: Yes Medications obtained and verified?: No Any new allergies since your discharge?: No Dietary orders reviewed?: NA Do you have support at home?: Yes People in Home: child(ren), adult Name of Support/Comfort Primary Source: [place] and Equipment/Supplies: [place] Ordered?: NA Any new equipment or medical supplies ordered?: NA  Functional Questionnaire: Do you need assistance with bathing/showering or dressing?: No Do you need assistance with meal preparation?: No Do you need assistance with eating?: No Do you have difficulty maintaining continence: No Do you need assistance with getting out of bed/getting out of a chair/moving?: No Do you have difficulty managing or taking your medications?: No  Follow up appointments reviewed: PCP Follow-up appointment confirmed?: No (RN CM offered to assist with scheduling appt-daughter prefers to call and make appt later when she has her calendar/schedule available) MD Provider Line Number:[phone] Given: No [facility] Follow-up appointment confirmed?: NA Do you need transportation to your follow-up appointment?: No Do you understand care options if your  condition(s) worsen?: Yes-patient verbalized understanding  SDOH Interventions Today    Flowsheet Row Most Recent Value  SDOH Interventions   Food Insecurity Interventions Intervention Not Indicated  Transportation Interventions Intervention Not Indicated      TOC Interventions Today    Flowsheet Row Most Recent Value  TOC Interventions   TOC Interventions Discussed/Reviewed TOC Interventions Discussed      Interventions Today    Flowsheet Row Most Recent Value  General Interventions   General Interventions Discussed/Reviewed General Interventions Discussed, Doctor Visits  Doctor Visits Discussed/Reviewed Doctor Visits Discussed, PCP  PCP/Specialist Visits Compliance with follow-up visit  Education Interventions   Education Provided Provided Education  Provided Verbal Education On When to see the doctor, Medication, Nutrition  Nutrition Interventions   Nutrition Discussed/Reviewed Nutrition Discussed  Pharmacy Interventions   Pharmacy Dicussed/Reviewed Pharmacy Topics Discussed  Safety Interventions   Safety Discussed/Reviewed Safety Discussed       Hetty Blend [facility] Care Management Care Management Community Coordinator Direct Phone: [phone] Toll Free: [phone] Fax: [phone]

## 2023-01-29 NOTE — Telephone Encounter (Signed)
Spoke with pt daughter, [name]. Pt is doing well after recent steroid injections and would like to wait on the gel until needed.

## 2023-02-18 ENCOUNTER — Ambulatory Visit (HOSPITAL_COMMUNITY): Payer: Medicare Other | Admitting: Psychiatry

## 2023-02-24 ENCOUNTER — Encounter: Payer: Medicare Other | Admitting: Family Medicine

## 2023-03-07 ENCOUNTER — Other Ambulatory Visit: Payer: Self-pay | Admitting: Family Medicine

## 2023-03-11 ENCOUNTER — Other Ambulatory Visit: Payer: Self-pay | Admitting: Family Medicine

## 2023-04-28 ENCOUNTER — Encounter: Payer: Self-pay | Admitting: Neurology

## 2023-04-28 ENCOUNTER — Ambulatory Visit: Payer: Medicare Other | Admitting: Neurology

## 2023-04-28 VITALS — BP 116/70 | Ht 64.0 in | Wt 143.0 lb

## 2023-04-28 DIAGNOSIS — G301 Alzheimer's disease with late onset: Secondary | ICD-10-CM

## 2023-04-28 DIAGNOSIS — F02A2 Dementia in other diseases classified elsewhere, mild, with psychotic disturbance: Secondary | ICD-10-CM | POA: Diagnosis not present

## 2023-04-28 MED ORDER — RIVASTIGMINE TARTRATE 1.5 MG PO CAPS: 1.5000 mg | ORAL_CAPSULE | Freq: Two times a day (BID) | ORAL | 6 refills | Status: DC
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
## 2023-04-28 NOTE — Patient Instructions (Signed)
Discontinue Aricept Start Exelon 1.5 mg twice daily, at time for refill, we will increase it to 3 mg twice daily Continue your other medications Continue follow-up with PCP Follow-up in 1 year or sooner if worse   There are well-accepted and sensible ways to reduce risk for Alzheimers disease and other degenerative brain disorders .  Exercise Daily Walk A daily 20 minute walk should be part of your routine. Disease related apathy can be a significant roadblock to exercise and the only way to overcome this is to make it a daily routine and perhaps have a reward at the end (something your loved one loves to eat or drink perhaps) or a personal trainer coming to the home can also be very useful. Most importantly, the patient is much more likely to exercise if the caregiver / spouse does it with him/her. In general a structured, repetitive schedule is best.  General Health: Any diseases which effect your body will effect your brain such as a pneumonia, urinary infection, blood clot, heart attack or stroke. Keep contact with your primary care doctor for regular follow ups.  Sleep. A good nights sleep is healthy for the brain. Seven hours is recommended. If you have insomnia or poor sleep habits we can give you some instructions. If you have sleep apnea wear your mask.  Diet: Eating a heart healthy diet is also a good idea; fish and poultry instead of red meat, nuts (mostly non-peanuts), vegetables, fruits, olive oil or canola oil (instead of butter), minimal salt (use other spices to flavor foods), whole grain rice, bread, cereal and pasta and wine in moderation.Research is now showing that the MIND diet, which is a combination of The Mediterranean diet and the DASH diet, is beneficial for cognitive processing and longevity. Information about this diet can be found in The MIND Diet, a book by Alonna Minium, MS, RDN, and online at [employer]  Finances, Power of [address]  and Advance Directives: You should consider putting legal safeguards in place with regard to financial and medical decision making. While the spouse always has power of attorney for medical and financial issues in the absence of any form, you should consider what you want in case the spouse / caregiver is no longer around or capable of making decisions.

## 2023-05-20 ENCOUNTER — Inpatient Hospital Stay: Payer: Medicare Other | Admitting: Hematology and Oncology

## 2023-05-20 ENCOUNTER — Telehealth: Payer: Self-pay

## 2023-05-20 NOTE — Telephone Encounter (Signed)
Pt's daughter called and states she is Bridget Lane's caregiver and her means of transportation. [name] currently has COVID and can not bring pt to her appt. We cancelled Bridget Garin's appt and scheduling message sent to have pt r/s within a month.  [name] is aware and verbalized thanks.
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
## 2023-05-22 ENCOUNTER — Telehealth: Payer: Self-pay | Admitting: Hematology and Oncology

## 2023-05-22 NOTE — Telephone Encounter (Signed)
Scheduled appointment per staff message. Talked with the patients daughter [name] and she is aware of the made appointment for the patient.

## 2023-06-05 DIAGNOSIS — H35033 Hypertensive retinopathy, bilateral: Secondary | ICD-10-CM | POA: Diagnosis not present

## 2023-06-05 DIAGNOSIS — H353113 Nonexudative age-related macular degeneration, right eye, advanced atrophic without subfoveal involvement: Secondary | ICD-10-CM | POA: Diagnosis not present

## 2023-06-05 DIAGNOSIS — H353124 Nonexudative age-related macular degeneration, left eye, advanced atrophic with subfoveal involvement: Secondary | ICD-10-CM | POA: Diagnosis not present

## 2023-06-05 DIAGNOSIS — H43813 Vitreous degeneration, bilateral: Secondary | ICD-10-CM | POA: Diagnosis not present

## 2023-06-09 ENCOUNTER — Inpatient Hospital Stay: Payer: Medicare Other | Attending: Hematology and Oncology | Admitting: Hematology and Oncology

## 2023-06-09 VITALS — BP 119/57 | HR 69 | Temp 97.8°F | Resp 18 | Ht 64.0 in | Wt 144.4 lb

## 2023-06-09 DIAGNOSIS — R296 Repeated falls: Secondary | ICD-10-CM | POA: Diagnosis not present

## 2023-06-09 DIAGNOSIS — Z79899 Other long term (current) drug therapy: Secondary | ICD-10-CM | POA: Diagnosis not present

## 2023-06-09 DIAGNOSIS — Z170 Estrogen receptor positive status [ER+]: Secondary | ICD-10-CM | POA: Diagnosis not present

## 2023-06-09 DIAGNOSIS — Z79811 Long term (current) use of aromatase inhibitors: Secondary | ICD-10-CM | POA: Diagnosis not present

## 2023-06-09 DIAGNOSIS — M2560 Stiffness of unspecified joint, not elsewhere classified: Secondary | ICD-10-CM | POA: Diagnosis not present

## 2023-06-09 DIAGNOSIS — C50512 Malignant neoplasm of lower-outer quadrant of left female breast: Secondary | ICD-10-CM | POA: Insufficient documentation

## 2023-06-09 DIAGNOSIS — M2550 Pain in unspecified joint: Secondary | ICD-10-CM | POA: Insufficient documentation

## 2023-06-09 DIAGNOSIS — M8580 Other specified disorders of bone density and structure, unspecified site: Secondary | ICD-10-CM | POA: Insufficient documentation

## 2023-06-09 DIAGNOSIS — Z882 Allergy status to sulfonamides status: Secondary | ICD-10-CM | POA: Diagnosis not present

## 2023-06-09 DIAGNOSIS — Z880 Allergy status to penicillin: Secondary | ICD-10-CM | POA: Insufficient documentation

## 2023-06-09 DIAGNOSIS — Z885 Allergy status to narcotic agent status: Secondary | ICD-10-CM | POA: Insufficient documentation
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
## 2023-07-01 ENCOUNTER — Other Ambulatory Visit: Payer: Self-pay | Admitting: Family Medicine

## 2023-07-01 DIAGNOSIS — Z1231 Encounter for screening mammogram for malignant neoplasm of breast: Secondary | ICD-10-CM

## 2023-07-29 ENCOUNTER — Ambulatory Visit
Admission: RE | Admit: 2023-07-29 | Discharge: 2023-07-29 | Disposition: A | Discharge status: Home / Self Care | Payer: Medicare Other | Source: Ambulatory Visit | Attending: Family Medicine | Admitting: Family Medicine

## 2023-07-29 DIAGNOSIS — Z1231 Encounter for screening mammogram for malignant neoplasm of breast: Secondary | ICD-10-CM | POA: Diagnosis not present

## 2023-08-04 ENCOUNTER — Telehealth: Payer: Self-pay | Admitting: Family Medicine

## 2023-08-04 NOTE — Telephone Encounter (Signed)
Prescription Request  08/04/2023  LOV: 10/24/2022  What is the name of the medication or equipment? busPIRone (BUSPAR) 5 MG tablet  omeprazole (PRILOSEC) 40 MG capsule  DULoxetine (CYMBALTA) 30 MG capsule  Have you contacted your pharmacy to request a refill? Yes   Which pharmacy would you like this sent to?  [facility]  [address], [place], [place] [address] Phone: [phone]  Patient notified that their request is being sent to the clinical staff for review and that they should receive a response within 2 business days.   Please advise at Mobile [phone] (mobile)

## 2023-08-05 MED ORDER — BUSPIRONE HCL 5 MG PO TABS: 5.0000 mg | ORAL_TABLET | Freq: Two times a day (BID) | ORAL | 5 refills | Status: DC | PRN

## 2023-08-05 MED ORDER — OMEPRAZOLE 40 MG PO CPDR: DELAYED_RELEASE_CAPSULE | ORAL | 3 refills | Status: DC

## 2023-08-05 MED ORDER — DULOXETINE HCL 30 MG PO CPEP: 60.0000 mg | ORAL_CAPSULE | Freq: Every day | ORAL | 3 refills | Status: DC

## 2023-08-05 NOTE — Telephone Encounter (Signed)
Refills sent to requested pharmacy.

## 2023-08-06 DIAGNOSIS — H524 Presbyopia: Secondary | ICD-10-CM | POA: Diagnosis not present

## 2023-09-01 ENCOUNTER — Encounter: Payer: Self-pay | Admitting: Cardiovascular Disease
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
## 2023-09-02 ENCOUNTER — Ambulatory Visit: Payer: Medicare Other | Attending: Cardiovascular Disease | Admitting: Cardiovascular Disease

## 2023-09-02 ENCOUNTER — Encounter: Payer: Self-pay | Admitting: Cardiovascular Disease

## 2023-09-02 VITALS — BP 136/74 | HR 64 | Ht 64.0 in | Wt 153.0 lb

## 2023-09-02 DIAGNOSIS — I480 Paroxysmal atrial fibrillation: Secondary | ICD-10-CM | POA: Diagnosis not present

## 2023-09-02 DIAGNOSIS — E785 Hyperlipidemia, unspecified: Secondary | ICD-10-CM | POA: Diagnosis not present

## 2023-09-02 NOTE — Patient Instructions (Signed)
 Follow-Up: At [facility], you and your health needs are our priority.  As part of our continuing mission to provide you with exceptional heart care, we have created designated Provider Care Teams.  These Care Teams include your primary Cardiologist (physician) and Advanced Practice Providers (APPs -  Physician Assistants and Nurse Practitioners) who all work together to provide you with the care you need, when you need it.  Your next appointment:   1 year(s)  Provider:   Kristeen Miss, MD

## 2023-09-10 ENCOUNTER — Other Ambulatory Visit: Payer: Self-pay

## 2023-09-10 ENCOUNTER — Ambulatory Visit: Payer: Medicare Other | Admitting: Family Medicine

## 2023-09-10 VITALS — BP 136/74 | HR 64 | Ht 64.0 in | Wt 153.0 lb

## 2023-09-10 DIAGNOSIS — M25561 Pain in right knee: Secondary | ICD-10-CM | POA: Diagnosis not present

## 2023-09-10 DIAGNOSIS — G8929 Other chronic pain: Secondary | ICD-10-CM | POA: Diagnosis not present

## 2023-09-10 DIAGNOSIS — M25562 Pain in left knee: Secondary | ICD-10-CM

## 2023-09-10 DIAGNOSIS — M170 Bilateral primary osteoarthritis of knee: Secondary | ICD-10-CM | POA: Diagnosis not present

## 2023-09-10 MED ORDER — HYALURONAN 88 MG/4ML IX SOSY
88.0000 mg | PREFILLED_SYRINGE | Freq: Once | INTRA_ARTICULAR | Status: AC
  Administered 2023-09-10: 88 mg via INTRA_ARTICULAR

## 2023-09-10 NOTE — Progress Notes (Signed)
[name], PhD, LAT, ATC acting as a scribe for [name], MD.  Bridget Lane is a 89 y.o. female who presents to [facility] today for cont'd bilat knee pain. Pt was last seen by Dr. Denyse Amass on 01/07/23 and was given a R knee steroid injection.  Today, pt reports both knees are really hurting her today. Pain returned about a wk ago. She notes the R knee seems swollen today.  Dx imaging: 03/13/22 R knee XR 11/05/21 R & L knee XR 07/19/20 R & L knee XR  Pertinent review of systems: No fevers or chills  Relevant historical information: Atrial fibrillation.  Mild Alzheimer's.   Exam:  BP 136/74   Pulse 64   Ht 5\' 4"  (1.626 m)   Wt 153 lb (69.4 kg)   SpO2 99%   BMI 26.26 kg/m  General: Well Developed, well nourished, and in no acute distress.   MSK: Knees bilaterally mild effusion normal motion with crepitation.    Lab and Radiology Results  Bridget Lane presents to clinic today for Monovisc injection lateral knee  Procedure: Real-time Ultrasound Guided Injection of right knee joint superior lateral patellar space Device: Philips Affiniti 50G/GE Logiq Images permanently stored and available for review in PACS Verbal informed consent obtained.  Discussed risks and benefits of procedure. Warned about infection, bleeding, damage to structures among others. Patient expresses understanding and agreement Time-out conducted.   Noted no overlying erythema, induration, or other signs of local infection.   Skin prepped in a sterile fashion.   Local anesthesia: Topical Ethyl chloride.   With sterile technique and under real time ultrasound guidance: Monovisc 4 mL injected into knee joint. Fluid seen entering the joint capsule.   Completed without difficulty   Advised to call if fevers/chills, erythema, induration, drainage, or persistent bleeding.   Images permanently stored and available for review in the ultrasound unit.  Impression:  Technically successful ultrasound guided injection.  Procedure: Real-time Ultrasound Guided Injection of left knee joint superior lateral patellar space Device: Philips Affiniti 50G/GE Logiq Images permanently stored and available for review in PACS Verbal informed consent obtained.  Discussed risks and benefits of procedure. Warned about infection, bleeding, damage to structures among others. Patient expresses understanding and agreement Time-out conducted.   Noted no overlying erythema, induration, or other signs of local infection.   Skin prepped in a sterile fashion.   Local anesthesia: Topical Ethyl chloride.   With sterile technique and under real time ultrasound guidance: Monovisc 4 mL injected into knee joint. Fluid seen entering the joint capsule.   Completed without difficulty   Advised to call if fevers/chills, erythema, induration, drainage, or persistent bleeding.   Images permanently stored and available for review in the ultrasound unit.  Impression: Technically successful ultrasound guided injection. Lot number: [id number] for both injections      Assessment and Plan: 89 y.o. female with bilateral knee pain due to DJD.  Plan for Monovisc injection of both knees today.  Recheck as needed.  Can perform this injection as soon as 3 months from now which is Mar 09, 2024.   PDMP not reviewed this encounter. Orders Placed This Encounter  Procedures   [place] LIMITED JOINT SPACE STRUCTURES LOW BILAT(NO LINKED CHARGES)    Order Specific Question:   Reason for Exam (SYMPTOM  OR DIAGNOSIS REQUIRED)    Answer:   bilateral knee pain    Order Specific Question:   Preferred imaging location?  Answer:   Adult nurse [facility] ordered this encounter  Medications   Hyaluronan (MONOVISC) intra-articular injection 88 mg   Hyaluronan (MONOVISC) intra-articular injection 88 mg     Discussed warning signs or symptoms. Please see discharge instructions. Patient  expresses understanding.   The above documentation has been reviewed and is accurate and complete [name], M.D.

## 2023-09-10 NOTE — Patient Instructions (Addendum)
Thank you for coming in today.   You received an injection today. Seek immediate medical attention if the joint becomes red, extremely painful, or is oozing fluid.   Give [place] a heads up in the future when you are ready to repeat these injections  Check back as needed

## 2023-09-15 NOTE — Telephone Encounter (Signed)
Pt received Monovisc injections for BILAT knee OA on 09/10/23.  Can consider repeat injections on or after 03/10/24.

## 2023-10-02 ENCOUNTER — Other Ambulatory Visit: Payer: Self-pay | Admitting: Family Medicine

## 2023-10-09 ENCOUNTER — Ambulatory Visit: Payer: Medicare Other | Admitting: Family Medicine

## 2023-10-20 ENCOUNTER — Ambulatory Visit (INDEPENDENT_AMBULATORY_CARE_PROVIDER_SITE_OTHER): Payer: Medicare Other | Admitting: Family Medicine

## 2023-10-20 ENCOUNTER — Encounter: Payer: Self-pay | Admitting: Family Medicine

## 2023-10-20 ENCOUNTER — Other Ambulatory Visit: Payer: Self-pay | Admitting: Family Medicine

## 2023-10-20 VITALS — BP 138/76 | HR 62 | Temp 97.6°F | Ht 64.0 in | Wt 156.0 lb

## 2023-10-20 DIAGNOSIS — I480 Paroxysmal atrial fibrillation: Secondary | ICD-10-CM

## 2023-10-20 DIAGNOSIS — R7303 Prediabetes: Secondary | ICD-10-CM | POA: Diagnosis not present

## 2023-10-20 DIAGNOSIS — I700 Atherosclerosis of aorta: Secondary | ICD-10-CM | POA: Diagnosis not present

## 2023-10-20 DIAGNOSIS — Z131 Encounter for screening for diabetes mellitus: Secondary | ICD-10-CM

## 2023-10-20 DIAGNOSIS — S0993XA Unspecified injury of face, initial encounter: Secondary | ICD-10-CM

## 2023-10-20 DIAGNOSIS — E785 Hyperlipidemia, unspecified: Secondary | ICD-10-CM

## 2023-10-20 DIAGNOSIS — D6869 Other thrombophilia: Secondary | ICD-10-CM

## 2023-10-20 NOTE — Patient Instructions (Addendum)
We will call you within two weeks about your referral to CT maxillofacial  through [facility].  Their phone number is [phone].  Please call them if you have not heard in 1-2 weeks  You are eligible to schedule your annual wellness visit with our nurse specialist Inetta Fermo.  Please consider scheduling this before you leave today  Please check with your pharmacy to see if they have the shingrix vaccine. If they do- please get this immunization and update [place] by phone call or mychart with dates you receive the vaccine  Please stop by lab before you go If you have mychart- we will send your results within 3 business days of [place] receiving them.  If you do not have mychart- we will call you about results within 5 business days of [place] receiving them.  *please also note that you will see labs on mychart as soon as they post. I will later go in and write notes on them- will say "notes from Dr. Durene Cal"   Recommended follow up: Return for next already scheduled visit or sooner if needed.
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
## 2023-10-21 LAB — LIPID PANEL
Cholesterol: 145 mg/dL (ref 0–200)
HDL: 66.8 mg/dL (ref >39.00)
LDL Cholesterol: 57 mg/dL (ref 0–99)
NonHDL: 77.88
Total CHOL/HDL Ratio: 2
Triglycerides: 104 mg/dL (ref 0.0–149.0)
VLDL: 20.8 mg/dL (ref 0.0–40.0)

## 2023-10-21 LAB — CBC WITH DIFFERENTIAL/PLATELET
Basophils Absolute: 0.1 10*3/uL (ref 0.0–0.1)
Basophils Relative: 1 % (ref 0.0–3.0)
Eosinophils Absolute: 0.1 10*3/uL (ref 0.0–0.7)
Eosinophils Relative: 1.7 % (ref 0.0–5.0)
HCT: 36.3 % (ref 36.0–46.0)
Hemoglobin: 11.8 g/dL — ABNORMAL LOW (ref 12.0–15.0)
Lymphocytes Relative: 23.1 % (ref 12.0–46.0)
Lymphs Abs: 1.6 10*3/uL (ref 0.7–4.0)
MCHC: 32.4 g/dL (ref 30.0–36.0)
MCV: 89.5 fL (ref 78.0–100.0)
Monocytes Absolute: 0.5 10*3/uL (ref 0.1–1.0)
Monocytes Relative: 6.6 % (ref 3.0–12.0)
Neutro Abs: 4.8 10*3/uL (ref 1.4–7.7)
Neutrophils Relative %: 67.6 % (ref 43.0–77.0)
Platelets: 348 10*3/uL (ref 150.0–400.0)
RBC: 4.06 Mil/uL (ref 3.87–5.11)
RDW: 14.5 % (ref 11.5–15.5)
WBC: 7.1 10*3/uL (ref 4.0–10.5)

## 2023-10-21 LAB — COMPREHENSIVE METABOLIC PANEL
ALT: 12 U/L (ref 0–35)
AST: 20 U/L (ref 0–37)
Albumin: 4.3 g/dL (ref 3.5–5.2)
Alkaline Phosphatase: 70 U/L (ref 39–117)
BUN: 34 mg/dL — ABNORMAL HIGH (ref 6–23)
CO2: 24 meq/L (ref 19–32)
Calcium: 9.3 mg/dL (ref 8.4–10.5)
Chloride: 108 meq/L (ref 96–112)
Creatinine, Ser: 1.47 mg/dL — ABNORMAL HIGH (ref 0.40–1.20)
GFR: 31.69 mL/min — ABNORMAL LOW (ref >60.00)
Glucose, Bld: 91 mg/dL (ref 70–99)
Potassium: 4.7 meq/L (ref 3.5–5.1)
Sodium: 141 meq/L (ref 135–145)
Total Bilirubin: 0.3 mg/dL (ref 0.2–1.2)
Total Protein: 7 g/dL (ref 6.0–8.3)

## 2023-10-21 LAB — HEMOGLOBIN A1C: Hgb A1c MFr Bld: 6.1 % (ref 4.6–6.5)

## 2023-10-24 ENCOUNTER — Ambulatory Visit
Admission: RE | Admit: 2023-10-24 | Discharge: 2023-10-24 | Disposition: A | Discharge status: Home / Self Care | Payer: Medicare Other | Source: Ambulatory Visit | Attending: Family Medicine | Admitting: Family Medicine

## 2023-10-24 DIAGNOSIS — R220 Localized swelling, mass and lump, head: Secondary | ICD-10-CM | POA: Diagnosis not present

## 2023-10-24 DIAGNOSIS — S0993XA Unspecified injury of face, initial encounter: Secondary | ICD-10-CM

## 2023-10-28 ENCOUNTER — Encounter: Payer: Self-pay | Admitting: Family Medicine

## 2023-11-04 ENCOUNTER — Other Ambulatory Visit: Payer: Self-pay | Admitting: Family Medicine

## 2023-11-05 ENCOUNTER — Encounter: Payer: Self-pay | Admitting: Family Medicine

## 2023-11-06 ENCOUNTER — Other Ambulatory Visit: Payer: Self-pay

## 2023-11-06 DIAGNOSIS — S0083XA Contusion of other part of head, initial encounter: Secondary | ICD-10-CM

## 2023-11-06 DIAGNOSIS — R220 Localized swelling, mass and lump, head: Secondary | ICD-10-CM

## 2023-11-07 ENCOUNTER — Telehealth: Payer: Self-pay | Admitting: Otolaryngology

## 2023-11-07 ENCOUNTER — Encounter (INDEPENDENT_AMBULATORY_CARE_PROVIDER_SITE_OTHER): Payer: Self-pay | Admitting: Otolaryngology

## 2023-11-07 NOTE — Telephone Encounter (Signed)
Bridget Lane this pt was scheduled with Dr Irene Pap for a hematoma side of face, pt was not see in ED, I noticed when answering a message she asked and checked her apt. The other ENT office booked the apt. Is this ok to keep. Please advise  There is no notes that Dr Irene Pap agreed to see.  Pt apt is 12-11-23

## 2023-11-17 ENCOUNTER — Other Ambulatory Visit: Payer: Self-pay | Admitting: Family Medicine

## 2023-11-17 ENCOUNTER — Encounter: Payer: Self-pay | Admitting: Family Medicine

## 2023-11-18 NOTE — Telephone Encounter (Signed)
Dr. Durene Cal,   These delusions, paranoia are new from the last time I saw them. I agree with Rexulti, 0.5 mg and titrate to 2 mg daily. She will also need to restart Cymbalta or Celexa. I will have the nurse add her to my cancellation list and we will try to see her next month. I can give her daughter a capacity letter during that appointment, hopefully that can help obtain some type of power of attorney.   Dr. Teresa Coombs

## 2023-11-20 MED ORDER — REXULTI 0.5 MG PO TABS: 0.5000 mg | ORAL_TABLET | Freq: Every day | ORAL | 5 refills | Status: DC

## 2023-11-25 ENCOUNTER — Encounter: Payer: Self-pay | Admitting: Neurology

## 2023-11-25 ENCOUNTER — Other Ambulatory Visit (HOSPITAL_COMMUNITY): Payer: Self-pay

## 2023-11-25 ENCOUNTER — Telehealth: Payer: Self-pay

## 2023-11-25 NOTE — Telephone Encounter (Signed)
 Pharmacy Patient Advocate Encounter   Received notification from  [facility] Portal that prior authorization for Rexulti  0.5mg  tablets is required/requested.   Insurance verification completed.   The patient is insured through [employer].   Per test claim: PA required; PA submitted to above mentioned insurance via CoverMyMeds Key/confirmation #/EOC [id number] Status is pending

## 2023-11-25 NOTE — Telephone Encounter (Signed)
 Pharmacy Patient Advocate Encounter  Received notification from [facility] Medicare that Prior Authorization for Rexulti  0.5MG  tablets has been APPROVED from 11/25/23 to 11/24/24. Ran test claim, Copay is $473.69. This test claim was processed through [facility]- copay amounts may vary at other pharmacies due to pharmacy/plan contracts, or as the patient moves through the different stages of their insurance plan.   PA #/Case ID/Reference #: [id number]

## 2023-11-26 ENCOUNTER — Telehealth: Payer: Self-pay

## 2023-11-26 NOTE — Telephone Encounter (Signed)
 Noted.   Copied from CRM [id number]. Topic: General - Other >> Nov 25, 2023 10:39 AM Rosina BIRCH wrote: Reason for CRM: Charlyne called from [facility] stating the patient was approved  for her rexulti  medication through 11/24/24. Charlyne will be faxing over an approval letter to the office  CB [phone] option 5

## 2023-11-26 NOTE — Telephone Encounter (Signed)
Cancellation list

## 2023-12-01 ENCOUNTER — Encounter: Payer: Self-pay | Admitting: Neurology

## 2023-12-03 ENCOUNTER — Other Ambulatory Visit: Payer: Self-pay | Admitting: Family Medicine

## 2023-12-03 ENCOUNTER — Telehealth: Payer: Self-pay

## 2023-12-03 NOTE — Telephone Encounter (Signed)
Dr. Denyse Amass,  I'm not sure if you've seen this message, it came through as a scheduling message.   Holly Bodily Aderman  P [facility] Admin (supporting Mychart, Generic)2 days ago    I left a fairly detailed entry about why we are making the appt. My mother is being weird lately and sometimes not letting me go back with her. Please make sure Dr. Denyse Amass has those notes. Thanks so so much for your help.     Morph[place], Hermine Messick routed conversation to [place]; Thomasena Edis, Dorathy Daft S2 days ago   Candis Schatz  P [facility] Admin2 days ago    Appointment for: JUANITA STREIGHT ([id number]) Visit type: OFFICE VISIT (1004) 12/10/2023 2:00 PM (15 minutes) with [name] in [facility]   Patient comments: My Mother is having increased problems with her knee and in general her right (?) leg. She has issues with pain, mobility and swelling. We are wondering if an MRI might help in trying to look at the possibility of repair to her knee. The leg seems to be the only issue reducing her mobility. Aside from her dementia and obvious mental decline, her mobility is really changing her lifestyle and her social activities. The last shot she had did not improve her knee pain. Thank you Philis Fendt

## 2023-12-09 ENCOUNTER — Ambulatory Visit (INDEPENDENT_AMBULATORY_CARE_PROVIDER_SITE_OTHER): Payer: Medicare Other

## 2023-12-09 ENCOUNTER — Other Ambulatory Visit: Payer: Self-pay

## 2023-12-09 ENCOUNTER — Ambulatory Visit: Payer: Medicare Other | Admitting: Family Medicine

## 2023-12-09 VITALS — BP 132/78 | Ht 64.0 in | Wt 159.0 lb

## 2023-12-09 DIAGNOSIS — G8929 Other chronic pain: Secondary | ICD-10-CM

## 2023-12-09 DIAGNOSIS — M25561 Pain in right knee: Secondary | ICD-10-CM

## 2023-12-09 DIAGNOSIS — M1711 Unilateral primary osteoarthritis, right knee: Secondary | ICD-10-CM

## 2023-12-09 DIAGNOSIS — M7989 Other specified soft tissue disorders: Secondary | ICD-10-CM | POA: Diagnosis not present

## 2023-12-09 NOTE — Progress Notes (Signed)
 [name], PhD, LAT, ATC acting as a scribe for [name], MD.  Bridget Lane is a 89 y.o. female who presents to [facility] today for worsening R leg pain. Pt was last seen by Dr. Denyse Amass on 09/10/23 and was given bilat Monovisc injections.  Today, pt reports R knee pain has worsened, pain varies day-to-day. Pt has suffered several falls since her last visit. Pt notes swelling over the R knee.   Dx imaging: 03/13/22 R knee XR 11/05/21 R & L knee XR 07/19/20 R & L knee XR  Pertinent review of systems: No fevers or chills  Relevant historical information: Alzheimer's disease.  Osteoporosis.   Exam:  BP 132/78   Ht 5\' 4"  (1.626 m)   Wt 159 lb (72.1 kg)   BMI 27.29 kg/m  General: Well Developed, well nourished, and in no acute distress.   MSK: Right knee genu valgus present.  Tender palpation medial joint line normal motion with crepitation. Mild laxity with LCL stress test.   Lab and Radiology Results  Procedure: Real-time Ultrasound Guided Injection of right knee joint superior lateral patella space Device: Philips Affiniti 50G/GE Logiq Images permanently stored and available for review in PACS Verbal informed consent obtained.  Discussed risks and benefits of procedure. Warned about infection, bleeding, hyperglycemia damage to structures among others. Patient expresses understanding and agreement Time-out conducted.   Noted no overlying erythema, induration, or other signs of local infection.   Skin prepped in a sterile fashion.   Local anesthesia: Topical Ethyl chloride.   With sterile technique and under real time ultrasound guidance: 40 mg of Kenalog and 2 mL of Marcaine injected into knee joint. Fluid seen entering the joint capsule.   Completed without difficulty   Pain immediately resolved suggesting accurate placement of the medication.   Advised to call if fevers/chills, erythema, induration, drainage, or persistent  bleeding.   Images permanently stored and available for review in the ultrasound unit.  Impression: Technically successful ultrasound guided injection.    X-ray images right knee obtained today personally and independently interpreted. Severe lateral DJD.  No acute fractures. Await formal radiology review    Assessment and Plan: 89 y.o. female with chronic right knee pain with acute exacerbation.  Unfortunately hyaluronic acid injection performed 3 months ago has not been very effective.  Will proceed to the cortisone shot today.  She is not a good candidate for knee replacement given her other medical problems.  She may be a good candidate for genicular artery embolization and we will place a referral to interventional radiology to discuss that option for her.   PDMP not reviewed this encounter. Orders Placed This Encounter  Procedures   DG Knee AP/LAT W/Sunrise Right    Standing Status:   Future    Number of Occurrences:   1    Expiration Date:   01/06/2024    Reason for Exam (SYMPTOM  OR DIAGNOSIS REQUIRED):   right knee pain    Preferred imaging location?:   [place]   [place] LIMITED JOINT SPACE STRUCTURES LOW RIGHT(NO LINKED CHARGES)    Reason for Exam (SYMPTOM  OR DIAGNOSIS REQUIRED):   right knee pain    Preferred imaging location?:   [facility]    Standing Status:   Future    Expiration Date:   12/08/2024    Scheduling Instructions:     Genicular Artery Embolization consultation  Reason for Exam (SYMPTOM  OR DIAGNOSIS REQUIRED):   chronic right knee pain    Preferred imaging location?:   [facility]   No orders of the defined types were placed in this encounter.    Discussed warning signs or symptoms. Please see discharge instructions. Patient expresses understanding.   The above documentation has been reviewed and is accurate and complete [name], M.D.

## 2023-12-09 NOTE — Patient Instructions (Addendum)
 Thank you for coming in today.   Please get an Xray today before you leave   You received an injection today. Seek immediate medical attention if the joint becomes red, extremely painful, or is oozing fluid.   I've referred you to Intervention Radiology for a consultation about the Genicular Artery Embolization procedure.  Let us know if you don't hear from them in one week.

## 2023-12-10 ENCOUNTER — Ambulatory Visit: Payer: Medicare Other | Admitting: Family Medicine

## 2023-12-11 ENCOUNTER — Ambulatory Visit (INDEPENDENT_AMBULATORY_CARE_PROVIDER_SITE_OTHER): Payer: Medicare Other | Admitting: Otolaryngology

## 2023-12-11 ENCOUNTER — Encounter (INDEPENDENT_AMBULATORY_CARE_PROVIDER_SITE_OTHER): Payer: Self-pay | Admitting: Otolaryngology

## 2023-12-11 VITALS — BP 156/75 | HR 65 | Ht 64.0 in | Wt 151.0 lb

## 2023-12-11 DIAGNOSIS — S0993XA Unspecified injury of face, initial encounter: Secondary | ICD-10-CM | POA: Diagnosis not present

## 2023-12-11 DIAGNOSIS — W19XXXA Unspecified fall, initial encounter: Secondary | ICD-10-CM | POA: Diagnosis not present

## 2023-12-11 DIAGNOSIS — R220 Localized swelling, mass and lump, head: Secondary | ICD-10-CM | POA: Diagnosis not present

## 2023-12-11 MED ORDER — METHYLPREDNISOLONE 4 MG PO TBPK: ORAL_TABLET | ORAL | 1 refills | Status: DC

## 2023-12-11 MED ORDER — DOXYCYCLINE HYCLATE 100 MG PO TABS: 100.0000 mg | ORAL_TABLET | Freq: Two times a day (BID) | ORAL | 0 refills | Status: AC

## 2023-12-11 NOTE — Progress Notes (Signed)
 ENT CONSULT:  Reason for Consult: fall and facial swelling    HPI: Discussed the use of AI scribe software for clinical note transcription with the patient, who gave verbal consent to proceed.  History of Present Illness   Bridget Lane is a 89 year old female who presents with persistent facial swelling and tenderness following a fall. She was referred by Dr. Durene Cal for evaluation of persistent facial swelling and tenderness.  She experienced a fall around Christmas time, approximately two to three days before the holiday, resulting in facial trauma. She fell from a chair into a Christmas tree, impacting her face. Initially, she had significant swelling and tenderness on her face, particularly on the left cheek, with external bruising and small puncture wounds on her upper lip. The swelling was initially very large but has since decreased significantly, although some tenderness persists.  A scan was performed in January, weeks after the fall, which showed no fractures but suggested a possible fluid collection beneath the soft tissues along left cheek. There was a concern about a potential foreign body, possibly wood, from the Christmas tree, as she had multiple small punctures from the branches mainly along her lip/vestibule. However, no definitive foreign body was identified on the scan. She denies any laceration or skin tears along the left cheek.    She denies any history of facial, head, or neck surgery, except for a spinal surgery performed many years ago. She is not on blood thinners and has no history of significant bleeding disorders. No significant pain unless pressure is applied to the affected area.  No new symptoms such as increased redness or swelling that would suggest an infection. No history of facial surgery or significant facial trauma prior to this incident.      Records Reviewed:  Office visit with Dr Durene Cal 10/20/23 # Fall S: Patient was trying to place a star on the top  of the Christmas tree standing up on a chair (we have discussed no ladders on chairs)-fell and hit the left side of her face with a bump on the left side of the back of her head- was 6 days ago . No loss of consciousness. Was not dizzy- just not in good posiiton.  A/P: Fall off chair about 6 days ago. Fall reduction counseling discussed. She is rather tender along left cheek (less so in scalp) but both areas improving- offered CT maxillofacial and she  agrees to move forward- no headahes- mild tenderness along scalp now- opts out of CT head. We are glad we have taken her off of anticoagulation as a result    Past Medical History:  Diagnosis Date   Atrial fibrillation (HCC)    persistent   Colon polyp 2005   Dementia (HCC)    Depression    DJD (degenerative joint disease)    Dysrhythmia    Esophageal stricture    Family history of breast cancer    Family history of esophageal cancer    Family history of prostate cancer    Gait abnormality 07/07/2020   GERD (gastroesophageal reflux disease)    H. pylori infection    Hiatal hernia    Hyperlipidemia    Lumbar spinal stenosis 07/07/2020   L4-5   Macular degeneration    Nontoxic multinodular goiter    OA (osteoarthritis)    Postherpetic neuralgia at T3-T5 level 04/27/2011   Rectal bleeding 07/06/2014   Hemorrhoid related in past.      Past Surgical History:  Procedure Laterality Date  ANTERIOR CERVICAL DECOMP/DISCECTOMY FUSION N/A 05/29/2015   Procedure: ANTERIOR CERVICAL DECOMPRESSION/DISCECTOMY FUSION CERVICAL FOUR-FIVE,CERVICAL FIVE-SIX;  Surgeon: Donalee Citrin, MD;  Location: [facility];  Service: Neurosurgery;  Laterality: N/A;   APPENDECTOMY     BREAST LUMPECTOMY Left 06/2018   BREAST LUMPECTOMY WITH RADIOACTIVE SEED LOCALIZATION Left 06/25/2018   Procedure: BREAST LUMPECTOMY WITH RADIOACTIVE SEED LOCALIZATION;  Surgeon: Harriette Bouillon, MD;  Location: [facility];  Service: General;  Laterality: Left;   CARDIAC  CATHETERIZATION     CARDIOVERSION  08/29/2011   Procedure: CARDIOVERSION;  Surgeon: Shawnie Pons, MD;  Location: [facility] OR;  Service: Cardiovascular;  Laterality: N/A;   CARDIOVERSION  11/22/2011   Procedure: CARDIOVERSION;  Surgeon: Gardiner Rhyme, MD;  Location: [facility] OR;  Service: Cardiovascular;  Laterality: N/A;   CHOLECYSTECTOMY  1989   COLONOSCOPY W/ POLYPECTOMY  2005   Neg in 2010; Dr Juanda Chance   ESOPHAGEAL DILATION     > 3 X; Dr Juanda Chance   LEFT AND RIGHT HEART CATHETERIZATION WITH CORONARY ANGIOGRAM N/A 12/02/2011   Procedure: LEFT AND RIGHT HEART CATHETERIZATION WITH CORONARY ANGIOGRAM;  Surgeon: Dolores Patty, MD;  Location: [facility] CATH LAB;  Service: Cardiovascular;  Laterality: N/A;   TOTAL ABDOMINAL HYSTERECTOMY  1972   for pain (no BSO)   TUBAL LIGATION     with appendectomy   UPPER GI ENDOSCOPY  2010   H pylori    Family History  Problem Relation Age of Onset   CAD Other    Heart failure Mother    Coronary artery disease Mother    Diabetes Mother    Osteoarthritis Father    Coronary artery disease Father    Prostate cancer Father        in 40s   Breast cancer Sister        in 18's   Diabetes Sister    Esophageal cancer Brother        smoked   Breast cancer Maternal Aunt     Social History:  reports that she has never smoked. She has never used smokeless tobacco. She reports that she does not drink alcohol and does not use drugs.  Allergies:  Allergies  Allergen Reactions   Penicillins Rash   Codeine Other (See Comments)    hallucinations hallucinations   Codeine Other (See Comments)    nightmares   Dabigatran Etexilate Mesylate     Unk reaction   Dilaudid [Hydromorphone]     Rash with this in December 2022 hospitalization. hallucinations   Acetaminophen Other (See Comments)    hurts stomach hurts stomach   Dabigatran Etexilate Mesylate Other (See Comments)     All extremities feel heavy and hurt   Dabigatran Etexilate Mesylate Other (See Comments)     All  extremities feel heavy and hurt   Penicillins Rash   Sulfa Antibiotics Rash   Sulfonamide Derivatives Rash    Medications: I have reviewed the patient's current medications.  The PMH, PSH, Medications, Allergies, and SH were reviewed and updated.  ROS: Constitutional: Negative for fever, weight loss and weight gain. Cardiovascular: Negative for chest pain and dyspnea on exertion. Respiratory: Is not experiencing shortness of breath at rest. Gastrointestinal: Negative for nausea and vomiting. Neurological: Negative for headaches. Psychiatric: The patient is not nervous/anxious  Blood pressure (!) 156/75, pulse 65, height 5\' 4"  (1.626 m), weight 151 lb (68.5 kg), SpO2 97%. Body mass index is 25.92 kg/m.  PHYSICAL EXAM:  Exam: General: Well-developed, well-nourished Respiratory Respiratory effort: Equal inspiration  and expiration without stridor Cardiovascular Peripheral Vascular: Warm extremities with equal color/perfusion Eyes: No nystagmus with equal extraocular motion bilaterally Neuro/Psych/Balance: Patient oriented to person, place, and time; Appropriate mood and affect; Gait is intact with no imbalance; Cranial nerves I-XII are intact Head and Face Inspection: Normocephalic and atraumatic without mass or lesion Palpation: Facial skeleton intact without bony stepoffs Salivary Glands: No mass or tenderness Facial Strength: Facial motility symmetric and full bilaterally ENT Pinna: External ear intact and fully developed External canal: Canal is patent with intact skin Tympanic Membrane: Clear and mobile External Nose: No scar or anatomic deformity Internal Nose: Septum is relatively straight. No polyp, or purulence. Mucosal edema and erythema present.  Lips teeth: edentulous, no mucosal injury along upper lip vestibule once dentures are removed Oral/Oropharynx: no exudate or erythema Neck Neck and Trachea: Midline trachea without mass or lesion Thyroid: No mass or  nodularity Lymphatics: No lymphadenopathy   Studies Reviewed: CT Max/face 10/24/23 CLINICAL DATA:  Fall with left-sided facial swelling.   EXAM: CT MAXILLOFACIAL WITHOUT CONTRAST   TECHNIQUE: Multidetector CT imaging of the maxillofacial structures was performed. Multiplanar CT image reconstructions were also generated.   RADIATION DOSE REDUCTION: This exam was performed according to the departmental dose-optimization program which includes automated exposure control, adjustment of the mA and/or kV according to patient size and/or use of iterative reconstruction technique.   COMPARISON:  08/19/2022   FINDINGS: Osseous: No acute fracture or mandibular dislocation. The maxilla is edentulous. TMJ osteoarthritis symmetrically. Cervical spine degeneration with C4-5 ACDF.   Orbits: No evidence of injury.  Bilateral cataract resection   Sinuses: Negative for hemosinus   Soft tissues: Ovoid subcutaneous opacity in the left cheek measuring 23 x 8 mm. Ventrally there is an internal 6 mm gas density, reportedly the injury was 10/11/2023. The adjacent skin and fat does not appear reticulated to strongly implicate infection.   Limited intracranial: No evidence of injury   IMPRESSION: 23 x 8 mm collection in the subcutaneous left cheek, history suggesting subacute hematoma. There is an internal 6 mm gas density, unexpected for laceration related gas given the multiple weeks since injury - gas containing foreign body, especially woody, could give this appearance. Negative for facial fracture.    Assessment/Plan: Encounter Diagnoses  Name Primary?   Facial swelling Yes   Fall, initial encounter    Facial injury, initial encounter     Assessment and Plan    Facial trauma with Possible Persistent Hematoma  The patient experienced a fall around Christmas, resulting in a facial injury. The left cheek with initial swelling and pain, both improved over time. Patient reports some  fullness in the area, CT ordered by PCP with evidence of fluid collection and concern for retained foreign body/splinter. Differential diagnosis includes a hematoma or a possible foreign body, though the latter is unlikely since there are no signs or symptoms of infection. She reports her pain improved, and swelling has decreased significantly, and on exam there is no erythema, edema, and based on hx she did not have lacerations that would indicate she could have had a foreign body lodge in her cheek. Conservative treatment with antibiotics and steroids is planned. Discussed benefits of conservative management, and low likelihood of a foreign body based on hx and her sx progression. Repeat imaging or procedural intervention may be considered if symptoms worsen. Will attempt to needle aspirate if her sx persist.  - Prescribed doxycycline 10 mg BID for two weeks - Prescribe a steroid dose pack -  Advise taking antibiotics with yogurt or a probiotic to prevent diarrhea - Recommend warm compresses or ice packs for comfort - Schedule follow-up appointment in eight weeks - Advise calling the office if symptoms worsen or new symptoms develop - will consider CT neck or MRI neck on return if sx persist   Thank you for allowing me to participate in the care of this patient. Please do not hesitate to contact me with any questions or concerns.   Ashok Croon, MD Otolaryngology [facility] Phone: [phone] Fax: [phone]    12/11/2023, 2:49 PM

## 2023-12-11 NOTE — Progress Notes (Signed)
 Chief Complaint: Patient was seen in consultation today for right knee pain  Referring Physician(s): Corey,Evan S  History of Present Illness: Bridget Lane is a 89 y.o. female with a medical history significant for atrial fibrillation, macular degeneration, lumbar spinal stenosis (L4-5), dementia, depression and degenerative joint disease. She has bilateral knee pain right > left. She has had hyaluronic and steroid injections without much relief. Due to her medical comorbidities she is not a good candidate for knee surgery. She has suffered several recent falls and her right knee pain has worsened. She has been referred to Interventional Radiology by Dr. Denyse Amass who believes she may be a good candidate for geniculate artery embolization.   [place] Pain Score =  VAS Pain Score =   Past Medical History:  Diagnosis Date   Atrial fibrillation (HCC)    persistent   Colon polyp 2005   Dementia (HCC)    Depression    DJD (degenerative joint disease)    Dysrhythmia    Esophageal stricture    Family history of breast cancer    Family history of esophageal cancer    Family history of prostate cancer    Gait abnormality 07/07/2020   GERD (gastroesophageal reflux disease)    H. pylori infection    Hiatal hernia    Hyperlipidemia    Lumbar spinal stenosis 07/07/2020   L4-5   Macular degeneration    Nontoxic multinodular goiter    OA (osteoarthritis)    Postherpetic neuralgia at T3-T5 level 04/27/2011   Rectal bleeding 07/06/2014   Hemorrhoid related in past.      Past Surgical History:  Procedure Laterality Date   ANTERIOR CERVICAL DECOMP/DISCECTOMY FUSION N/A 05/29/2015   Procedure: ANTERIOR CERVICAL DECOMPRESSION/DISCECTOMY FUSION CERVICAL FOUR-FIVE,CERVICAL FIVE-SIX;  Surgeon: Donalee Citrin, MD;  Location: [facility];  Service: Neurosurgery;  Laterality: N/A;   APPENDECTOMY     BREAST LUMPECTOMY Left 06/2018   BREAST LUMPECTOMY WITH RADIOACTIVE SEED LOCALIZATION Left 06/25/2018    Procedure: BREAST LUMPECTOMY WITH RADIOACTIVE SEED LOCALIZATION;  Surgeon: Harriette Bouillon, MD;  Location: [facility];  Service: General;  Laterality: Left;   CARDIAC CATHETERIZATION     CARDIOVERSION  08/29/2011   Procedure: CARDIOVERSION;  Surgeon: Shawnie Pons, MD;  Location: [facility] OR;  Service: Cardiovascular;  Laterality: N/A;   CARDIOVERSION  11/22/2011   Procedure: CARDIOVERSION;  Surgeon: Gardiner Rhyme, MD;  Location: [facility] OR;  Service: Cardiovascular;  Laterality: N/A;   CHOLECYSTECTOMY  1989   COLONOSCOPY W/ POLYPECTOMY  2005   Neg in 2010; Dr Juanda Chance   ESOPHAGEAL DILATION     > 3 X; Dr Juanda Chance   LEFT AND RIGHT HEART CATHETERIZATION WITH CORONARY ANGIOGRAM N/A 12/02/2011   Procedure: LEFT AND RIGHT HEART CATHETERIZATION WITH CORONARY ANGIOGRAM;  Surgeon: Dolores Patty, MD;  Location: [facility] CATH LAB;  Service: Cardiovascular;  Laterality: N/A;   TOTAL ABDOMINAL HYSTERECTOMY  1972   for pain (no BSO)   TUBAL LIGATION     with appendectomy   UPPER GI ENDOSCOPY  2010   H pylori    Allergies: Penicillins, Codeine, Codeine, Dabigatran etexilate mesylate, Dilaudid [hydromorphone], Acetaminophen, Dabigatran etexilate mesylate, Dabigatran etexilate mesylate, Penicillins, Sulfa antibiotics, and Sulfonamide derivatives  Medications: Prior to Admission medications   Medication Sig Start Date End Date Taking? Authorizing Provider  Brexpiprazole (REXULTI) 0.5 MG TABS Take 1 tablet (0.5 mg total) by mouth daily. 11/20/23   Shelva Majestic, MD  busPIRone (BUSPAR) 5 MG tablet Take 1 tablet (5  mg total) by mouth 2 (two) times daily as needed (Anxiety). 08/05/23   Shelva Majestic, MD  DULoxetine (CYMBALTA) 30 MG capsule Take 2 capsules (60 mg total) by mouth daily. 08/05/23   Shelva Majestic, MD  flecainide (TAMBOCOR) 100 MG tablet TAKE ONE TABLET BY MOUTH TWICE DAILY 11/18/23   Shelva Majestic, MD  metoprolol succinate (TOPROL-XL) 25 MG 24 hr tablet TAKE 1/2 TABLET BY MOUTH  TWICE DAILY 12/04/23   Shelva Majestic, MD  Multiple Vitamins-Minerals (MULTI FOR HER 50+) TABS Take 1 tablet by mouth daily.    [provider]  Multiple Vitamins-Minerals (PRESERVISION AREDS 2+MULTI VIT PO) Take 1 tablet by mouth in the morning and at bedtime.    [provider]  omeprazole (PRILOSEC) 40 MG capsule TAKE ONE (1) CAPSULE BY MOUTH TWICE DAILY 11/05/23   Shelva Majestic, MD  Polyethyl Glycol-Propyl Glycol 0.4-0.3 % SOLN Apply 1-2 drops to eye daily as needed (stye, dryness).    [provider]  rivastigmine (EXELON) 1.5 MG capsule Take 1 capsule (1.5 mg total) by mouth 2 (two) times daily. Patient not taking: Reported on 10/20/2023 04/28/23 11/24/23  Windell Norfolk, MD  rosuvastatin (CRESTOR) 5 MG tablet TAKE ONE TABLET BY MOUTH IN THE EVENING 10/03/23   Shelva Majestic, MD     Family History  Problem Relation Age of Onset   CAD Other    Heart failure Mother    Coronary artery disease Mother    Diabetes Mother    Osteoarthritis Father    Coronary artery disease Father    Prostate cancer Father        in 43s   Breast cancer Sister        in 22's   Diabetes Sister    Esophageal cancer Brother        smoked   Breast cancer Maternal Aunt     Social History   Socioeconomic History   Marital status: Legally Separated    Spouse name: Not on file   Number of children: 4   Years of education: Not on file   Highest education level: Not on file  Occupational History   Occupation: retired  Tobacco Use   Smoking status: Never   Smokeless tobacco: Never  Vaping Use   Vaping status: Never Used  Substance and Sexual Activity   Alcohol use: Never   Drug use: Never   Sexual activity: Not Currently  Other Topics Concern   Not on file  Social History Narrative   ** Merged History Encounter ** Married 1956. 4 children 2 boys 2 girls. 16 grandkids.  5 greatgrandkids.    Pt lives in [place] alone      Retired from [employer].      Hobbies: travel, spend time with people, family time   Exercise-walking      No HCPOA-advised to do this.       Wears a life alert watch   Caffeine- 1 cup   Right handed   Social Drivers of Health   Financial Resource Strain: Not on file  Food Insecurity: No Food Insecurity (01/22/2023)   Hunger Vital Sign    Worried About Running Out of Food in the Last Year: Never true    Ran Out of Food in the Last Year: Never true  Transportation Needs: No Transportation Needs (01/22/2023)   PRAPARE - Administrator, Civil Service (Medical): No    Lack of Transportation (Non-Medical): No  Physical Activity: Not on file  Stress: Not on file  Social Connections: Not on file    Review of Systems: A 12 point ROS discussed and pertinent positives are indicated in the HPI above.  All other systems are negative.  Review of Systems  Vital Signs: There were no vitals taken for this visit.  Advance Care Plan: The advanced care plan/surrogate decision maker was discussed at the time of visit and documented in the medical record.    Physical Exam  Imaging: Right Knee AP 12/09/23       Labs:  CBC: Recent Labs    01/15/23 1650 10/20/23 1524  WBC 8.3 7.1  HGB 11.0* 11.8*  HCT 34.5* 36.3  PLT 264 348.0    COAGS: No results for input(s): "INR", "APTT" in the last 8760 hours.  BMP: Recent Labs    01/15/23 1650 10/20/23 1524  NA 137 141  K 5.3* 4.7  CL 105 108  CO2 23 24  GLUCOSE 87 91  BUN 49* 34*  CALCIUM 9.2 9.3  CREATININE 1.97* 1.47*  GFRNONAA 24*  --     LIVER FUNCTION TESTS: Recent Labs    01/15/23 1650 10/20/23 1524  BILITOT 0.6 0.3  AST 27 20  ALT 26 12  ALKPHOS 70 70  PROT 6.5 7.0  ALBUMIN 3.6 4.3    TUMOR MARKERS: No results for input(s): "AFPTM", "CEA", "CA199", "CHROMGRNA" in the last 8760 hours.  Assessment and Plan:  89 year old female with a history of bilateral knee pain right > left. She has been treated  conservatively with steroid and hyaluronic injections. These have provided insufficient pain relief. She has had several recent falls which have caused worsening right knee pain.    Thank you for this interesting consult.  I greatly enjoyed meeting Bridget Lane and look forward to participating in their care.  A copy of this report was sent to the requesting provider on this date.  Electronically Signed: Mickie Kay, NP 12/11/2023, 8:48 AM   I spent a total of  30 Minutes   in face to face in clinical consultation, greater than 50% of which was counseling/coordinating care for right knee pain.

## 2023-12-12 ENCOUNTER — Ambulatory Visit
Admission: RE | Admit: 2023-12-12 | Discharge: 2023-12-12 | Disposition: A | Discharge status: Home / Self Care | Payer: Medicare Other | Source: Ambulatory Visit | Attending: Family Medicine | Admitting: Family Medicine

## 2023-12-12 DIAGNOSIS — M25561 Pain in right knee: Secondary | ICD-10-CM | POA: Diagnosis not present

## 2023-12-12 DIAGNOSIS — G8929 Other chronic pain: Secondary | ICD-10-CM

## 2023-12-12 DIAGNOSIS — R269 Unspecified abnormalities of gait and mobility: Secondary | ICD-10-CM

## 2023-12-12 DIAGNOSIS — N179 Acute kidney failure, unspecified: Secondary | ICD-10-CM

## 2023-12-12 DIAGNOSIS — M1711 Unilateral primary osteoarthritis, right knee: Secondary | ICD-10-CM

## 2023-12-12 HISTORY — PX: IR RADIOLOGIST EVAL & MGMT: IMG5224

## 2023-12-15 ENCOUNTER — Telehealth: Payer: Self-pay | Admitting: Neurology

## 2023-12-15 ENCOUNTER — Ambulatory Visit: Payer: Self-pay | Admitting: Neurology

## 2023-12-15 NOTE — Telephone Encounter (Signed)
 Returned call to daughter, she cancelled her appointment today because patient refuses to go to doctor. She thinks daughter is stealing money from her and refuses to take her medication. Daughter is looking at getting sitting sitters and caregivers for patient. Rescheduled patient visit for July and added to waitlist.

## 2023-12-15 NOTE — Telephone Encounter (Signed)
 Pt's daughter, Loretta Plume, mother refuses to go to today's appointment with Dr. Teresa Coombs. She is angry, suspicious, she has be acting like this for a month. She thinks I have been taking money out of her account. Would like a call back

## 2023-12-17 ENCOUNTER — Encounter: Payer: Self-pay | Admitting: Neurology

## 2023-12-17 NOTE — Telephone Encounter (Signed)
 Spoke with [name] (daughter), she will call 911 and have patient admitted. I will write her a capacity letter.

## 2023-12-23 ENCOUNTER — Other Ambulatory Visit: Payer: Self-pay | Admitting: Interventional Radiology

## 2023-12-23 DIAGNOSIS — M1711 Unilateral primary osteoarthritis, right knee: Secondary | ICD-10-CM

## 2023-12-30 ENCOUNTER — Encounter: Payer: Self-pay | Admitting: Family Medicine

## 2023-12-30 NOTE — Progress Notes (Signed)
Right knee x-ray shows severe arthritis

## 2023-12-30 NOTE — Telephone Encounter (Signed)
 Please see BOTH messages below.   Forwarding to Dr. Denyse Amass to review and advise.

## 2023-12-31 NOTE — Progress Notes (Signed)
 Chief Complaint: Patient was seen in consultation today for right knee pain  Referring Physician(s): Corey,Evan S   History of Present Illness: Bridget Lane is an 89 y.o. female with a medical history significant for atrial fibrillation, macular degeneration, lumbar spinal stenosis (L4-5), dementia, depression and degenerative joint disease. She has bilateral knee pain right > left. She has had hyaluronic and steroid injections without much relief. Due to her medical comorbidities she is not a good candidate for knee surgery. She has suffered several recent falls and her right knee pain has worsened. She was referred to Interventional Radiology by Dr. Denyse Amass who believes she may be a good candidate for geniculate artery embolization. We met in consultation 12/12/23 and she stated her pain has been present for years. She has experienced some short-term relief from prior injections which have not proven to be durable.   We discussed the rationale, periprocedural expectation, and long-term outcomes including risks and benefits of geniculate artery embolization. She was in agreement to proceed.  Past Medical History:  Diagnosis Date   Atrial fibrillation (HCC)    persistent   Colon polyp 2005   Dementia (HCC)    Depression    DJD (degenerative joint disease)    Dysrhythmia    Esophageal stricture    Family history of breast cancer    Family history of esophageal cancer    Family history of prostate cancer    Gait abnormality 07/07/2020   GERD (gastroesophageal reflux disease)    H. pylori infection    Hiatal hernia    Hyperlipidemia    Lumbar spinal stenosis 07/07/2020   L4-5   Macular degeneration    Nontoxic multinodular goiter    OA (osteoarthritis)    Postherpetic neuralgia at T3-T5 level 04/27/2011   Rectal bleeding 07/06/2014   Hemorrhoid related in past.      Past Surgical History:  Procedure Laterality Date   ANTERIOR CERVICAL DECOMP/DISCECTOMY FUSION N/A 05/29/2015    Procedure: ANTERIOR CERVICAL DECOMPRESSION/DISCECTOMY FUSION CERVICAL FOUR-FIVE,CERVICAL FIVE-SIX;  Surgeon: Donalee Citrin, MD;  Location: [facility];  Service: Neurosurgery;  Laterality: N/A;   APPENDECTOMY     BREAST LUMPECTOMY Left 06/2018   BREAST LUMPECTOMY WITH RADIOACTIVE SEED LOCALIZATION Left 06/25/2018   Procedure: BREAST LUMPECTOMY WITH RADIOACTIVE SEED LOCALIZATION;  Surgeon: Harriette Bouillon, MD;  Location:  SURGERY CENTER;  Service: General;  Laterality: Left;   CARDIAC CATHETERIZATION     CARDIOVERSION  08/29/2011   Procedure: CARDIOVERSION;  Surgeon: Shawnie Pons, MD;  Location: [facility] OR;  Service: Cardiovascular;  Laterality: N/A;   CARDIOVERSION  11/22/2011   Procedure: CARDIOVERSION;  Surgeon: Gardiner Rhyme, MD;  Location: [facility] OR;  Service: Cardiovascular;  Laterality: N/A;   CHOLECYSTECTOMY  1989   COLONOSCOPY W/ POLYPECTOMY  2005   Neg in 2010; Dr Juanda Chance   ESOPHAGEAL DILATION     > 3 X; Dr Juanda Chance   IR RADIOLOGIST EVAL & MGMT  12/12/2023   LEFT AND RIGHT HEART CATHETERIZATION WITH CORONARY ANGIOGRAM N/A 12/02/2011   Procedure: LEFT AND RIGHT HEART CATHETERIZATION WITH CORONARY ANGIOGRAM;  Surgeon: Dolores Patty, MD;  Location: [facility] C[facility] LAB;  Service: Cardiovascular;  Laterality: N/A;   TOTAL ABDOMINAL HYSTERECTOMY  1972   for pain (no BSO)   TUBAL LIGATION     with appendectomy   UPPER GI ENDOSCOPY  2010   H pylori    Allergies: Penicillins, Codeine, Codeine, Dabigatran etexilate mesylate, Dilaudid [hydromorphone], Acetaminophen, Dabigatran etexilate mesylate, Dabigatran etexilate mesylate, Penicillins, Sulfa  antibiotics, and Sulfonamide derivatives  Medications: Prior to Admission medications   Medication Sig Start Date End Date Taking? Authorizing Provider  Brexpiprazole (REXULTI) 0.5 MG TABS Take 1 tablet (0.5 mg total) by mouth daily. 11/20/23   Shelva Majestic, MD  busPIRone (BUSPAR) 5 MG tablet Take 1 tablet (5 mg total) by mouth 2 (two) times daily as  needed (Anxiety). 08/05/23   Shelva Majestic, MD  DULoxetine (CYMBALTA) 30 MG capsule Take 2 capsules (60 mg total) by mouth daily. 08/05/23   Shelva Majestic, MD  flecainide (TAMBOCOR) 100 MG tablet TAKE ONE TABLET BY MOUTH TWICE DAILY 11/18/23   Shelva Majestic, MD  methylPREDNISolone (MEDROL DOSEPAK) 4 MG TBPK tablet Take with signs of chronic sinusitis and take as directed 12/11/23   Ashok Croon, MD  metoprolol succinate (TOPROL-XL) 25 MG 24 hr tablet TAKE 1/2 TABLET BY MOUTH TWICE DAILY 12/04/23   Shelva Majestic, MD  Multiple Vitamins-Minerals (MULTI FOR HER 50+) TABS Take 1 tablet by mouth daily.    [provider]  Multiple Vitamins-Minerals (PRESERVISION AREDS 2+MULTI VIT PO) Take 1 tablet by mouth in the morning and at bedtime.    [provider]  omeprazole (PRILOSEC) 40 MG capsule TAKE ONE (1) CAPSULE BY MOUTH TWICE DAILY 11/05/23   Shelva Majestic, MD  Polyethyl Glycol-Propyl Glycol 0.4-0.3 % SOLN Apply 1-2 drops to eye daily as needed (stye, dryness).    [provider]  rivastigmine (EXELON) 1.5 MG capsule Take 1 capsule (1.5 mg total) by mouth 2 (two) times daily. Patient not taking: Reported on 10/20/2023 04/28/23 11/24/23  Windell Norfolk, MD  rosuvastatin (CRESTOR) 5 MG tablet TAKE ONE TABLET BY MOUTH IN THE EVENING 10/03/23   Shelva Majestic, MD     Family History  Problem Relation Age of Onset   CAD Other    Heart failure Mother    Coronary artery disease Mother    Diabetes Mother    Osteoarthritis Father    Coronary artery disease Father    Prostate cancer Father        in 39s   Breast cancer Sister        in 76's   Diabetes Sister    Esophageal cancer Brother        smoked   Breast cancer Maternal Aunt     Social History   Socioeconomic History   Marital status: Legally Separated    Spouse name: Not on file   Number of children: 4   Years of education: Not on file   Highest education level: Not on file  Occupational  History   Occupation: retired  Tobacco Use   Smoking status: Never   Smokeless tobacco: Never  Vaping Use   Vaping status: Never Used  Substance and Sexual Activity   Alcohol use: Never   Drug use: Never   Sexual activity: Not Currently  Other Topics Concern   Not on file  Social History Narrative   ** Merged History Encounter ** Married 1956. 4 children 2 boys 2 girls. 16 grandkids.  5 greatgrandkids.    Pt lives in [place] alone      Retired from [employer].      Hobbies: travel, spend time with people, family time   Exercise-walking      No HCPOA-advised to do this.       Wears a life alert watch   Caffeine- 1 cup   Right handed   Social Drivers of Health  Financial Resource Strain: Not on file  Food Insecurity: No Food Insecurity (01/22/2023)   Hunger Vital Sign    Worried About Running Out of Food in the Last Year: Never true    Ran Out of Food in the Last Year: Never true  Transportation Needs: No Transportation Needs (01/22/2023)   PRAPARE - Administrator, Civil Service (Medical): No    Lack of Transportation (Non-Medical): No  Physical Activity: Not on file  Stress: Not on file  Social Connections: Not on file    Review of Systems: A 12 point ROS discussed and pertinent positives are indicated in the HPI above.  All other systems are negative.  Review of Systems  Vital Signs: There were no vitals taken for this visit.  Physical Exam  Imaging:  Right Knee AP 12/09/23    Kellgren and Lawrence grade IV   Labs:  CBC: Recent Labs    01/15/23 1650 10/20/23 1524  WBC 8.3 7.1  HGB 11.0* 11.8*  HCT 34.5* 36.3  PLT 264 348.0    COAGS: No results for input(s): "INR", "APTT" in the last 8760 hours.  BMP: Recent Labs    01/15/23 1650 10/20/23 1524  NA 137 141  K 5.3* 4.7  CL 105 108  CO2 23 24  GLUCOSE 87 91  BUN 49* 34*  CALCIUM 9.2 9.3  CREATININE 1.97* 1.47*  GFRNONAA 24*  --     LIVER FUNCTION  TESTS: Recent Labs    01/15/23 1650 10/20/23 1524  BILITOT 0.6 0.3  AST 27 20  ALT 26 12  ALKPHOS 70 70  PROT 6.5 7.0  ALBUMIN 3.6 4.3    TUMOR MARKERS: No results for input(s): "AFPTM", "CEA", "CA199", "CHROMGRNA" in the last 8760 hours.  Assessment and Plan:  Right Knee Pain: Bridget Lane, 89 year old female, presents today for an image-guided right knee geniculate artery embolization.   Risks and benefits of this procedure were discussed with the patient including, but not limited to bleeding, infection, vascular injury or contrast induced renal failure.  This interventional procedure involves the use of X-rays and because of the nature of the planned procedure, it is possible that we will have prolonged use of X-ray fluoroscopy.  Potential radiation risks to you include (but are not limited to) the following: - A slightly elevated risk for cancer  several years later in life. This risk is typically less than 0.5% percent. This risk is low in comparison to the normal incidence of human cancer, which is 33% for women and 50% for men according to the [employer] Society. - Radiation induced injury can include skin redness, resembling a rash, tissue breakdown / ulcers and hair loss (which can be temporary or permanent).   The likelihood of either of these occurring depends on the difficulty of the procedure and whether you are sensitive to radiation due to previous procedures, disease, or genetic conditions.   IF your procedure requires a prolonged use of radiation, you will be notified and given written instructions for further action.  It is your responsibility to monitor the irradiated area for the 2 weeks following the procedure and to notify your physician if you are concerned that you have suffered a radiation induced injury.    All of the patient's questions were answered, patient is agreeable to proceed.  Consent signed and in chart.  Thank you for this  interesting consult.  I greatly enjoyed meeting Bridget Lane and look forward to participating in their  care.  A copy of this report was sent to the requesting provider on this date.  Electronically Signed: Alwyn Ren, AGACNP-BC 12/31/2023, 10:51 AM   I spent a total of  30 Minutes   in face to face in clinical consultation, greater than 50% of which was counseling/coordinating care for right knee pain.

## 2024-01-01 ENCOUNTER — Telehealth: Payer: Self-pay

## 2024-01-01 MED ORDER — METHYLPREDNISOLONE 4 MG PO TBPK: ORAL_TABLET | ORAL | 0 refills | Status: DC

## 2024-01-01 NOTE — Discharge Instructions (Signed)
 Discharge Instructions for Genicular Artery Embolization (GAE)   Post-Procedure Care   Activity:   Rest for the remainder of the day.   Avoid strenuous activities and heavy lifting for 48 hours.   Gradually resume normal activities as tolerated.   Pain Management:   You may experience mild pain or discomfort at the catheter insertion site or in the knee. This is normal.   Use over-the-counter pain relievers such as acetaminophen (Tylenol) or ibuprofen (Advil) as directed.   Apply an ice pack to the knee for 15-20 minutes every 2-3 hours to reduce swelling and discomfort.   Take Sol-Medrol Pack as directed per pharmacy.   Wound Care:   Keep the catheter insertion site clean and dry.   Remove the bandage after 24 hours and replace it with a clean, dry bandage if needed.   Avoid soaking in baths, hot tubs, or swimming pools for 5 days. Showers are allowed.   Medications:   Take prescribed medications as directed.   If you were taking blood thinners, follow your physician's instructions on when to resume them.   Diet:   Resume your normal diet.   Drink plenty of fluids to stay hydrated.   Follow-Up:   Schedule a follow-up appointment with your physician as instructed.   Contact your physician if you experience increased pain, swelling, redness, or drainage at the insertion site, or if you have a fever over 100.65F (38C).   When to Seek Immediate Medical Attention   Call [phone] or you may call [phone] and ask to speak to [facility] the nurse with any concerns:   Signs of infection at the catheter site (redness, warmth, pus).   Sudden weakness or numbness in the leg.   If you need to speak to someone after hours 5:00pm, please call the on call IR MD at [phone]. Tell them you are a patient of Dr. Elby Showers and you had a GAE today along with any issues you are having.      Call 911 if:   Difficulty breathing or chest pain.   You have severe pain in  your abdomen, and it does not get better with medicine.     You have leg pain or leg swelling.   You feel dizzy, or you faint.   Do not wait to see if the symptoms will go away.   Do not drive yourself to the hospital.   Please ensure you follow these instructions carefully and reach out to your healthcare provider if you have any concerns or questions. Wishing you a smooth and speedy recovery!        Thank you for visiting [facility]!!

## 2024-01-01 NOTE — Progress Notes (Signed)
 Called and spoke to patient's daughter Bridget Lane regarding her mother Bridget Lane's GAE scheduled for 01/02/24. Instructions given on NPO status, what to expect pre, during, and post procedure, clothing to wear, shaving her groin area if needed, allergies and her medications. While going over her allergies Bridget Lane stated her mother is not allergic to Tylenol as listed on her allergy list as an adverse reaction, she states her mother occasionally uses Tylenol at home, I asked if she would like for me to remove Tylenol from her allergy list and she stated yes. Time allowed to ask questions Bridget Lane verbalized understanding.

## 2024-01-01 NOTE — Progress Notes (Signed)
 Spoke with Bridget Lane about her mother's [name]'s upcoming GAE procedure on 01/02/24 explained that medrol pak was called in to [facility] in [place]. It is to be taken as prescribed after her GAE procedure.She verbalized understanding.

## 2024-01-02 ENCOUNTER — Ambulatory Visit
Admission: RE | Admit: 2024-01-02 | Discharge: 2024-01-02 | Disposition: A | Discharge status: Home / Self Care | Source: Ambulatory Visit | Attending: Interventional Radiology | Admitting: Interventional Radiology

## 2024-01-02 DIAGNOSIS — M1711 Unilateral primary osteoarthritis, right knee: Secondary | ICD-10-CM | POA: Diagnosis not present

## 2024-01-02 DIAGNOSIS — I798 Other disorders of arteries, arterioles and capillaries in diseases classified elsewhere: Secondary | ICD-10-CM | POA: Diagnosis not present

## 2024-01-02 HISTORY — PX: IR EMBO ARTERIAL NOT HEMORR HEMANG INC GUIDE ROADMAPPING: IMG5448

## 2024-01-02 MED ORDER — IODIXANOL 270 MG/ML IV SOLN: 100.0000 mL | Freq: Once | INTRAVENOUS | Status: DC

## 2024-01-02 MED ORDER — MIDAZOLAM HCL 2 MG/2ML IJ SOLN
INTRAMUSCULAR | Status: AC | PRN
Start: 2024-01-02 — End: 2024-01-02
  Administered 2024-01-02: 1 mg via INTRAVENOUS
  Administered 2024-01-02 (×2): .5 mg via INTRAVENOUS

## 2024-01-02 MED ORDER — FENTANYL CITRATE (PF) 100 MCG/2ML IJ SOLN
INTRAMUSCULAR | Status: AC | PRN
  Administered 2024-01-02 (×2): 25 ug via INTRAVENOUS

## 2024-01-02 MED ORDER — KETOROLAC TROMETHAMINE 30 MG/ML IJ SOLN
30.0000 mg | Freq: Once | INTRAMUSCULAR | Status: AC
  Administered 2024-01-02: 30 mg via INTRAVENOUS

## 2024-01-02 MED ORDER — FENTANYL CITRATE PF 50 MCG/ML IJ SOSY: 25.0000 ug | PREFILLED_SYRINGE | INTRAMUSCULAR | Status: DC | PRN

## 2024-01-02 MED ORDER — DEXAMETHASONE SODIUM PHOSPHATE 10 MG/ML IJ SOLN
10.0000 mg | Freq: Once | INTRAMUSCULAR | Status: AC
  Administered 2024-01-02: 10 mg via INTRAVENOUS

## 2024-01-02 MED ORDER — ACETAMINOPHEN 10 MG/ML IV SOLN
1000.0000 mg | Freq: Once | INTRAVENOUS | Status: AC
  Administered 2024-01-02: 1000 mg via INTRAVENOUS

## 2024-01-02 MED ORDER — MIDAZOLAM HCL 2 MG/2ML IJ SOLN: 1.0000 mg | INTRAMUSCULAR | Status: DC | PRN

## 2024-01-02 MED ORDER — SODIUM CHLORIDE 0.9 % IV SOLN: INTRAVENOUS | Status: DC

## 2024-01-02 NOTE — Progress Notes (Signed)
 Pt back in nursing recovery area at 11:00AM. Pt still drowsy from procedure but will wake up when spoken to. Pt follows commands, talks in complete sentences and has no complaints at this time. Pt will remain in nursing station until discharge.

## 2024-01-02 NOTE — Procedures (Signed)
 Interventional Radiology Procedure Note  Procedure: Right geniculate artery embolization   Findings: Please refer to procedural dictation for full description. Embolization of right inferior lateral and superior medial geniculate arteries.  Right proximal SFA 4 Fr catheter access, manual compression.  Complications: None immediate  Estimated Blood Loss: < 5 ml  Recommendations: 2 hours bedrest. IR will arrange outpatient follow up in 1 month.   Marliss Coots, MD

## 2024-01-09 ENCOUNTER — Other Ambulatory Visit

## 2024-01-09 NOTE — Progress Notes (Signed)
 Follow up call regarding right GAE on 01/02/24.  Per [name] Mrs. Yorke's daughter she is doing well and has had a complete recovery. She reports her mother stated, "I keep looking for pain and there isn't any". [name] also states she is very thankful for the staff at [facility]. I advised her to call if she needs anything. She verbalized understanding.

## 2024-01-21 ENCOUNTER — Other Ambulatory Visit: Payer: Self-pay | Admitting: Interventional Radiology

## 2024-01-21 DIAGNOSIS — M1711 Unilateral primary osteoarthritis, right knee: Secondary | ICD-10-CM

## 2024-01-22 ENCOUNTER — Encounter: Payer: Self-pay | Admitting: Family Medicine

## 2024-01-23 ENCOUNTER — Encounter: Payer: Self-pay | Admitting: Family Medicine

## 2024-01-23 ENCOUNTER — Ambulatory Visit: Payer: Medicare Other | Admitting: Family Medicine

## 2024-01-23 VITALS — BP 110/70 | HR 62 | Temp 97.8°F | Ht 64.0 in | Wt 153.6 lb

## 2024-01-23 DIAGNOSIS — E663 Overweight: Secondary | ICD-10-CM | POA: Diagnosis not present

## 2024-01-23 DIAGNOSIS — Z79899 Other long term (current) drug therapy: Secondary | ICD-10-CM

## 2024-01-23 DIAGNOSIS — Z131 Encounter for screening for diabetes mellitus: Secondary | ICD-10-CM

## 2024-01-23 DIAGNOSIS — G301 Alzheimer's disease with late onset: Secondary | ICD-10-CM

## 2024-01-23 DIAGNOSIS — M810 Age-related osteoporosis without current pathological fracture: Secondary | ICD-10-CM | POA: Diagnosis not present

## 2024-01-23 DIAGNOSIS — F02A2 Dementia in other diseases classified elsewhere, mild, with psychotic disturbance: Secondary | ICD-10-CM

## 2024-01-23 DIAGNOSIS — Z0000 Encounter for general adult medical examination without abnormal findings: Secondary | ICD-10-CM | POA: Diagnosis not present

## 2024-01-23 DIAGNOSIS — E785 Hyperlipidemia, unspecified: Secondary | ICD-10-CM | POA: Diagnosis not present

## 2024-01-23 DIAGNOSIS — I480 Paroxysmal atrial fibrillation: Secondary | ICD-10-CM

## 2024-01-23 LAB — COMPREHENSIVE METABOLIC PANEL WITH GFR
ALT: 15 U/L (ref 0–35)
AST: 19 U/L (ref 0–37)
Albumin: 4.2 g/dL (ref 3.5–5.2)
Alkaline Phosphatase: 67 U/L (ref 39–117)
BUN: 31 mg/dL — ABNORMAL HIGH (ref 6–23)
CO2: 24 meq/L (ref 19–32)
Calcium: 9.4 mg/dL (ref 8.4–10.5)
Chloride: 108 meq/L (ref 96–112)
Creatinine, Ser: 1.27 mg/dL — ABNORMAL HIGH (ref 0.40–1.20)
GFR: 37.7 mL/min — ABNORMAL LOW (ref >60.00)
Glucose, Bld: 76 mg/dL (ref 70–99)
Potassium: 4.4 meq/L (ref 3.5–5.1)
Sodium: 141 meq/L (ref 135–145)
Total Bilirubin: 0.5 mg/dL (ref 0.2–1.2)
Total Protein: 6.6 g/dL (ref 6.0–8.3)

## 2024-01-23 LAB — CBC WITH DIFFERENTIAL/PLATELET
Basophils Absolute: 0.1 10*3/uL (ref 0.0–0.1)
Basophils Relative: 1.1 % (ref 0.0–3.0)
Eosinophils Absolute: 0.2 10*3/uL (ref 0.0–0.7)
Eosinophils Relative: 2.5 % (ref 0.0–5.0)
HCT: 33.4 % — ABNORMAL LOW (ref 36.0–46.0)
Hemoglobin: 11.1 g/dL — ABNORMAL LOW (ref 12.0–15.0)
Lymphocytes Relative: 27.9 % (ref 12.0–46.0)
Lymphs Abs: 1.7 10*3/uL (ref 0.7–4.0)
MCHC: 33.1 g/dL (ref 30.0–36.0)
MCV: 89.4 fl (ref 78.0–100.0)
Monocytes Absolute: 0.5 10*3/uL (ref 0.1–1.0)
Monocytes Relative: 8.7 % (ref 3.0–12.0)
Neutro Abs: 3.7 10*3/uL (ref 1.4–7.7)
Neutrophils Relative %: 59.8 % (ref 43.0–77.0)
Platelets: 276 10*3/uL (ref 150.0–400.0)
RBC: 3.74 Mil/uL — ABNORMAL LOW (ref 3.87–5.11)
RDW: 14.6 % (ref 11.5–15.5)
WBC: 6.1 10*3/uL (ref 4.0–10.5)

## 2024-01-23 LAB — VITAMIN D 25 HYDROXY (VIT D DEFICIENCY, FRACTURES): VITD: 33.64 ng/mL (ref 30.00–100.00)

## 2024-01-23 LAB — VITAMIN B12: Vitamin B-12: 540 pg/mL (ref 211–911)
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
## 2024-01-23 NOTE — Patient Instructions (Addendum)
 Please stop by lab before you go If you have mychart- we will send your results within 3 business days of [place] receiving them.  If you do not have mychart- we will call you about results within 5 business days of [place] receiving them.  *please also note that you will see labs on mychart as soon as they post. I will later go in and write notes on them- will say "notes from Dr. Durene Cal"   See if we can get rexulti - if insurance can space out the cost for instance  Recommended follow up: Return in about 2 months (around 03/24/2024) for followup or sooner if needed.Schedule b4 you leave. To adjust rexulti if covered- otherwise if cannot get this we can space visits longer 4-6 months

## 2024-02-08 NOTE — Progress Notes (Signed)
 Referring Physician(s): Syliva Even  Chief Complaint: The patient is seen in follow up today s/p right geniculate artery embolization 01/02/24  History of present illness: Bridget Lane is an 89 y.o. female with a medical history significant for atrial fibrillation, macular degeneration, lumbar spinal stenosis (L4-5), dementia, depression and degenerative joint disease. She has bilateral knee pain right > left. She has had hyaluronic and steroid injections without much relief. Due to her medical comorbidities she is not a good candidate for knee surgery. She has suffered several recent falls and her right knee pain has worsened. She was referred to Interventional Radiology by Dr. Alease Hunter who believes she may be a good candidate for geniculate artery embolization. We met in consultation 12/12/23 and she stated her pain has been present for years. She has experienced some short-term relief from prior injections which have not proven to be durable.    We discussed the rationale, periprocedural expectation, and long-term outcomes including risks and benefits of geniculate artery embolization. She was in agreement to proceed and this was performed 01/02/24. She tolerated the procedure well and was discharged home the same day.  She presents to the clinic today for follow up. She reports feeling significantly better.  She denies any residual pain in her knee (0/10 today).  She is still ambulating with a cane.   Past Medical History:  Diagnosis Date   Atrial fibrillation (HCC)    persistent   Colon polyp 2005   Dementia (HCC)    Depression    DJD (degenerative joint disease)    Dysrhythmia    Esophageal stricture    Family history of breast cancer    Family history of esophageal cancer    Family history of prostate cancer    Gait abnormality 07/07/2020   GERD (gastroesophageal reflux disease)    H. pylori infection    Hiatal hernia    Hyperlipidemia    Lumbar spinal stenosis 07/07/2020    L4-5   Macular degeneration    Nontoxic multinodular goiter    OA (osteoarthritis)    Postherpetic neuralgia at T3-T5 level 04/27/2011   Rectal bleeding 07/06/2014   Hemorrhoid related in past.      Past Surgical History:  Procedure Laterality Date   ANTERIOR CERVICAL DECOMP/DISCECTOMY FUSION N/A 05/29/2015   Procedure: ANTERIOR CERVICAL DECOMPRESSION/DISCECTOMY FUSION CERVICAL FOUR-FIVE,CERVICAL FIVE-SIX;  Surgeon: Gearl Keens, MD;  Location: [facility];  Service: Neurosurgery;  Laterality: N/A;   APPENDECTOMY     BREAST LUMPECTOMY Left 06/2018   BREAST LUMPECTOMY WITH RADIOACTIVE SEED LOCALIZATION Left 06/25/2018   Procedure: BREAST LUMPECTOMY WITH RADIOACTIVE SEED LOCALIZATION;  Surgeon: Sim Dryer, MD;  Location: [facility];  Service: General;  Laterality: Left;   CARDIAC CATHETERIZATION     CARDIOVERSION  08/29/2011   Procedure: CARDIOVERSION;  Surgeon: Maisie Scotland, MD;  Location: [facility] OR;  Service: Cardiovascular;  Laterality: N/A;   CARDIOVERSION  11/22/2011   Procedure: CARDIOVERSION;  Surgeon: Ellaree Gunther, MD;  Location: [facility];  Service: Cardiovascular;  Laterality: N/A;   CHOLECYSTECTOMY  1989   COLONOSCOPY W/ POLYPECTOMY  2005   Neg in 2010; Dr Grandville Lax   ESOPHAGEAL DILATION     > 3 X; Dr Grandville Lax   IR EMBO ARTERIAL NOT HEMORR HEMANG INC GUIDE ROADMAPPING  01/02/2024   IR RADIOLOGIST EVAL & MGMT  12/12/2023   LEFT AND RIGHT HEART CATHETERIZATION WITH CORONARY ANGIOGRAM N/A 12/02/2011   Procedure: LEFT AND RIGHT HEART CATHETERIZATION WITH CORONARY ANGIOGRAM;  Surgeon:  Mardell Shade, MD;  Location: [facility] CATH LAB;  Service: Cardiovascular;  Laterality: N/A;   TOTAL ABDOMINAL HYSTERECTOMY  1972   for pain (no BSO)   TUBAL LIGATION     with appendectomy   UPPER GI ENDOSCOPY  2010   H pylori    Allergies: Penicillins, Codeine, Codeine, Dabigatran  etexilate mesylate, Dilaudid  [hydromorphone ], Dabigatran  etexilate mesylate, Dabigatran  etexilate mesylate,  Penicillins, Sulfa antibiotics, and Sulfonamide derivatives  Medications: Prior to Admission medications   Medication Sig Start Date End Date Taking? Authorizing Provider  Brexpiprazole  (REXULTI ) 0.5 MG TABS Take 1 tablet (0.5 mg total) by mouth daily. 11/20/23   Almira Jaeger, MD  busPIRone  (BUSPAR ) 5 MG tablet Take 1 tablet (5 mg total) by mouth 2 (two) times daily as needed (Anxiety). 08/05/23   Almira Jaeger, MD  DULoxetine  (CYMBALTA ) 30 MG capsule Take 2 capsules (60 mg total) by mouth daily. 08/05/23   Almira Jaeger, MD  flecainide  (TAMBOCOR ) 100 MG tablet TAKE ONE TABLET BY MOUTH TWICE DAILY 11/18/23   Almira Jaeger, MD  methylPREDNISolone  (MEDROL  DOSEPAK) 4 MG TBPK tablet Take with signs of chronic sinusitis and take as directed Patient not taking: Reported on 01/23/2024 12/11/23   Soldatova, Li[place], MD  metoprolol  succinate (TOPROL -XL) 25 MG 24 hr tablet TAKE 1/2 TABLET BY MOUTH TWICE DAILY 12/04/23   Almira Jaeger, MD  Multiple Vitamins-Minerals (MULTI FOR HER 50+) TABS Take 1 tablet by mouth daily.    [provider]  Multiple Vitamins-Minerals (PRESERVISION AREDS 2+MULTI VIT PO) Take 1 tablet by mouth in the morning and at bedtime.    [provider]  omeprazole  (PRILOSEC) 40 MG capsule TAKE ONE (1) CAPSULE BY MOUTH TWICE DAILY 11/05/23   Almira Jaeger, MD  Polyethyl Glycol-Propyl Glycol 0.4-0.3 % SOLN Apply 1-2 drops to eye daily as needed (stye, dryness).    [provider]  rivastigmine  (EXELON ) 1.5 MG capsule Take 1 capsule (1.5 mg total) by mouth 2 (two) times daily. Patient not taking: Reported on 10/20/2023 04/28/23 11/24/23  Cassandra Cleveland, MD  rosuvastatin  (CRESTOR ) 5 MG tablet TAKE ONE TABLET BY MOUTH IN THE EVENING 10/03/23   Almira Jaeger, MD     Family History  Problem Relation Age of Onset   CAD Other    Heart failure Mother    Coronary artery disease Mother    Diabetes Mother    Osteoarthritis Father    Coronary artery  disease Father    Prostate cancer Father        in 42s   Breast cancer Sister        in 78's   Diabetes Sister    Esophageal cancer Brother        smoked   Breast cancer Maternal Aunt     Social History   Socioeconomic History   Marital status: Legally Separated    Spouse name: Not on file   Number of children: 4   Years of education: Not on file   Highest education level: Not on file  Occupational History   Occupation: retired  Tobacco Use   Smoking status: Never   Smokeless tobacco: Never  Vaping Use   Vaping status: Never Used  Substance and Sexual Activity   Alcohol  use: Never   Drug use: Never   Sexual activity: Not Currently  Other Topics Concern   Not on file  Social History Narrative   ** Merged History Encounter ** Married 1956. 4 children 2 boys 2 girls.  16 grandkids.  5 greatgrandkids.    Pt lives in [place] alone      Retired from [employer].      Hobbies: travel, spend time with people, family time   Exercise-walking      No HCPOA-advised to do this.       Wears a life alert watch   Caffeine- 1 cup   Right handed   Social Drivers of Health   Financial Resource Strain: Not on file  Food Insecurity: No Food Insecurity (01/22/2023)   Hunger Vital Sign    Worried About Running Out of Food in the Last Year: Never true    Ran Out of Food in the Last Year: Never true  Transportation Needs: No Transportation Needs (01/22/2023)   PRAPARE - Administrator, Civil Service (Medical): No    Lack of Transportation (Non-Medical): No  Physical Activity: Not on file  Stress: Not on file  Social Connections: Not on file     Vital Signs: There were no vitals taken for this visit.  Physical Exam Constitutional:      General: She is not in acute distress. HENT:     Head: Normocephalic.     Mouth/Throat:     Mouth: Mucous membranes are moist.  Eyes:     General: No scleral icterus. Cardiovascular:     Rate and Rhythm:  Normal rate and regular rhythm.  Pulmonary:     Effort: Pulmonary effort is normal. No respiratory distress.  Abdominal:     General: Abdomen is flat.  Musculoskeletal:        General: No swelling.  Neurological:     Mental Status: She is alert and oriented to person, place, and time.     Imaging:   Right Knee AP 12/09/23    Kellgren and Lawrence grade IV  Labs:  CBC: Recent Labs    10/20/23 1524 01/23/24 1414  WBC 7.1 6.1  HGB 11.8* 11.1*  HCT 36.3 33.4*  PLT 348.0 276.0    COAGS: No results for input(s): "INR", "APTT" in the last 8760 hours.  BMP: Recent Labs    10/20/23 1524 01/23/24 1414  NA 141 141  K 4.7 4.4  CL 108 108  CO2 24 24  GLUCOSE 91 76  BUN 34* 31*  CALCIUM  9.3 9.4  CREATININE 1.47* 1.27*    LIVER FUNCTION TESTS: Recent Labs    10/20/23 1524 01/23/24 1414  BILITOT 0.3 0.5  AST 20 19  ALT 12 15  ALKPHOS 70 67  PROT 7.0 6.6  ALBUMIN 4.3 4.2    Assessment and Plan: 89 year old female with a history of bilateral knee pain (WOMAC 43/96) right > left secondary to osteoarthritis (K&G IV). She has been treated conservatively with steroid and hyaluronic injections. These have provided insufficient pain relief.  She is now status post right geniculate artery embolization 01/02/24 and reports significant improvement with resolution of knee pain.  Follow up in 6 months in IR clinic.  Creasie Doctor, MD Pager: [phone]    I spent a total of 25 Minutes in face to face in clinical consultation, greater than 50% of which was counseling/coordinating care for right knee pain.

## 2024-02-09 ENCOUNTER — Ambulatory Visit
Admission: RE | Admit: 2024-02-09 | Discharge: 2024-02-09 | Disposition: A | Discharge status: Home / Self Care | Source: Ambulatory Visit | Attending: Interventional Radiology | Admitting: Interventional Radiology

## 2024-02-09 DIAGNOSIS — M1711 Unilateral primary osteoarthritis, right knee: Secondary | ICD-10-CM | POA: Diagnosis not present

## 2024-02-09 HISTORY — PX: IR RADIOLOGIST EVAL & MGMT: IMG5224

## 2024-02-11 ENCOUNTER — Ambulatory Visit (INDEPENDENT_AMBULATORY_CARE_PROVIDER_SITE_OTHER): Payer: Medicare Other | Admitting: Otolaryngology

## 2024-02-11 ENCOUNTER — Encounter (INDEPENDENT_AMBULATORY_CARE_PROVIDER_SITE_OTHER): Payer: Self-pay | Admitting: Otolaryngology

## 2024-02-11 VITALS — BP 112/74 | HR 108

## 2024-02-11 DIAGNOSIS — S0993XA Unspecified injury of face, initial encounter: Secondary | ICD-10-CM | POA: Diagnosis not present

## 2024-02-11 DIAGNOSIS — T148XXA Other injury of unspecified body region, initial encounter: Secondary | ICD-10-CM

## 2024-02-11 DIAGNOSIS — W19XXXA Unspecified fall, initial encounter: Secondary | ICD-10-CM | POA: Diagnosis not present

## 2024-02-11 DIAGNOSIS — R220 Localized swelling, mass and lump, head: Secondary | ICD-10-CM

## 2024-02-11 MED ORDER — DOXYCYCLINE HYCLATE 100 MG PO TABS: 100.0000 mg | ORAL_TABLET | Freq: Two times a day (BID) | ORAL | 0 refills | Status: DC

## 2024-02-11 NOTE — Progress Notes (Signed)
 ENT Progress Note:   Update 02/11/2024  Discussed the use of AI scribe software for clinical note transcription with the patient, who gave verbal consent to proceed.  History of Present Illness Bridget Lane is an 89 year old female who presents with persistent left facial swelling following multiple falls and trauma to the left face.  She has persistent swelling on the right side of her face following multiple falls, with the most recent fall occurring approximately one week ago. The initial fall happened during Christmas, impacting her face, and she has experienced ongoing soreness since then. The swelling has decreased in size since her initial evaluation but has not completely resolved.  The swelling has changed in appearance and can be moved slightly. Initially, it caused significant protrusion of her cheek, which has since reduced. She is concerned about the possibility of an infection or other underlying issue due to the persistent tenderness, although it does not disturb her sleep or daily activities.  A previous scan conducted after the initial fall did not reveal any fractures, but showed left cheek soft tissue hematoma. She has not experienced any significant pain or discomfort aside from the tenderness when touched.  Her medication history includes a previous prescription of doxycycline /steroids, which she tolerated well. She has known allergies to penicillin and sulfa drugs.   Records Reviewed:  Initial Evaluation  Reason for Consult: fall and facial swelling    HPI: Discussed the use of AI scribe software for clinical note transcription with the patient, who gave verbal consent to proceed.  History of Present Illness   Bridget Lane is a 89 year old female who presents with persistent facial swelling and tenderness following a fall. She was referred by Dr. Arlene Ben for evaluation of persistent facial swelling and tenderness.  She experienced a fall around Christmas time,  approximately two to three days before the holiday, resulting in facial trauma. She fell from a chair into a Christmas tree, impacting her face. Initially, she had significant swelling and tenderness on her face, particularly on the left cheek, with external bruising and small puncture wounds on her upper lip. The swelling was initially very large but has since decreased significantly, although some tenderness persists.  A scan was performed in January, weeks after the fall, which showed no fractures but suggested a possible fluid collection beneath the soft tissues along left cheek. There was a concern about a potential foreign body, possibly wood, from the Christmas tree, as she had multiple small punctures from the branches mainly along her lip/vestibule. However, no definitive foreign body was identified on the scan. She denies any laceration or skin tears along the left cheek.    She denies any history of facial, head, or neck surgery, except for a spinal surgery performed many years ago. She is not on blood thinners and has no history of significant bleeding disorders. No significant pain unless pressure is applied to the affected area.  No new symptoms such as increased redness or swelling that would suggest an infection. No history of facial surgery or significant facial trauma prior to this incident.      Records Reviewed:  Office visit with Dr Arlene Ben 10/20/23 # Fall S: Patient was trying to place a star on the top of the Christmas tree standing up on a chair (we have discussed no ladders on chairs)-fell and hit the left side of her face with a bump on the left side of the back of her head- was 6 days ago .  No loss of consciousness. Was not dizzy- just not in good posiiton.  A/P: Fall off chair about 6 days ago. Fall reduction counseling discussed. She is rather tender along left cheek (less so in scalp) but both areas improving- offered CT maxillofacial and she  agrees to move forward- no  headahes- mild tenderness along scalp now- opts out of CT head. We are glad we have taken her off of anticoagulation as a result    Past Medical History:  Diagnosis Date   Atrial fibrillation (HCC)    persistent   Colon polyp 2005   Dementia (HCC)    Depression    DJD (degenerative joint disease)    Dysrhythmia    Esophageal stricture    Family history of breast cancer    Family history of esophageal cancer    Family history of prostate cancer    Gait abnormality 07/07/2020   GERD (gastroesophageal reflux disease)    H. pylori infection    Hiatal hernia    Hyperlipidemia    Lumbar spinal stenosis 07/07/2020   L4-5   Macular degeneration    Nontoxic multinodular goiter    OA (osteoarthritis)    Postherpetic neuralgia at T3-T5 level 04/27/2011   Rectal bleeding 07/06/2014   Hemorrhoid related in past.      Past Surgical History:  Procedure Laterality Date   ANTERIOR CERVICAL DECOMP/DISCECTOMY FUSION N/A 05/29/2015   Procedure: ANTERIOR CERVICAL DECOMPRESSION/DISCECTOMY FUSION CERVICAL FOUR-FIVE,CERVICAL FIVE-SIX;  Surgeon: Gearl Keens, MD;  Location: [facility];  Service: Neurosurgery;  Laterality: N/A;   APPENDECTOMY     BREAST LUMPECTOMY Left 06/2018   BREAST LUMPECTOMY WITH RADIOACTIVE SEED LOCALIZATION Left 06/25/2018   Procedure: BREAST LUMPECTOMY WITH RADIOACTIVE SEED LOCALIZATION;  Surgeon: Sim Dryer, MD;  Location: [facility];  Service: General;  Laterality: Left;   CARDIAC CATHETERIZATION     CARDIOVERSION  08/29/2011   Procedure: CARDIOVERSION;  Surgeon: Maisie Scotland, MD;  Location: [facility];  Service: Cardiovascular;  Laterality: N/A;   CARDIOVERSION  11/22/2011   Procedure: CARDIOVERSION;  Surgeon: Ellaree Gunther, MD;  Location: [facility];  Service: Cardiovascular;  Laterality: N/A;   CHOLECYSTECTOMY  1989   COLONOSCOPY W/ POLYPECTOMY  2005   Neg in 2010; Dr Grandville Lax   ESOPHAGEAL DILATION     > 3 X; Dr Grandville Lax   IR EMBO ARTERIAL NOT HEMORR HEMANG INC GUIDE  ROADMAPPING  01/02/2024   IR RADIOLOGIST EVAL & MGMT  12/12/2023   IR RADIOLOGIST EVAL & MGMT  02/09/2024   LEFT AND RIGHT HEART CATHETERIZATION WITH CORONARY ANGIOGRAM N/A 12/02/2011   Procedure: LEFT AND RIGHT HEART CATHETERIZATION WITH CORONARY ANGIOGRAM;  Surgeon: Mardell Shade, MD;  Location: [facility];  Service: Cardiovascular;  Laterality: N/A;   TOTAL ABDOMINAL HYSTERECTOMY  1972   for pain (no BSO)   TUBAL LIGATION     with appendectomy   UPPER GI ENDOSCOPY  2010   H pylori    Family History  Problem Relation Age of Onset   CAD Other    Heart failure Mother    Coronary artery disease Mother    Diabetes Mother    Osteoarthritis Father    Coronary artery disease Father    Prostate cancer Father        in 65s   Breast cancer Sister        in 65's   Diabetes Sister    Esophageal cancer Brother        smoked   Breast  cancer Maternal Aunt     Social History:  reports that she has never smoked. She has never used smokeless tobacco. She reports that she does not drink alcohol  and does not use drugs.  Allergies:  Allergies  Allergen Reactions   Penicillins Rash   Codeine Other (See Comments)    hallucinations hallucinations   Codeine Other (See Comments)    nightmares   Dabigatran  Etexilate Mesylate     Unk reaction   Dilaudid  [Hydromorphone ]     Rash with this in December 2022 hospitalization. hallucinations   Dabigatran  Etexilate Mesylate Other (See Comments)     All extremities feel heavy and hurt   Dabigatran  Etexilate Mesylate Other (See Comments)     All extremities feel heavy and hurt   Penicillins Rash   Sulfa Antibiotics Rash   Sulfonamide Derivatives Rash    Medications: I have reviewed the patient's current medications.  The PMH, PSH, Medications, Allergies, and SH were reviewed and updated.  ROS: Constitutional: Negative for fever, weight loss and weight gain. Cardiovascular: Negative for chest pain and dyspnea on exertion. Respiratory: Is  not experiencing shortness of breath at rest. Gastrointestinal: Negative for nausea and vomiting. Neurological: Negative for headaches. Psychiatric: The patient is not nervous/anxious  Blood pressure 112/74, pulse (!) 108, SpO2 97%. There is no height or weight on file to calculate BMI.  PHYSICAL EXAM:  Exam: General: Well-developed, well-nourished Respiratory Respiratory effort: Equal inspiration and expiration without stridor Cardiovascular Peripheral Vascular: Warm extremities with equal color/perfusion Eyes: No nystagmus with equal extraocular motion bilaterally Neuro/Psych/Balance: Patient oriented to person, place, and time; Appropriate mood and affect; Gait is intact with no imbalance; Cranial nerves I-XII are intact Head and Face Inspection: Normocephalic and atraumatic without mass or lesion, slight fullness along the left cheek area, decreased compared to prior exam, no erythema/induration noted  Palpation: Facial skeleton intact without bony stepoffs Salivary Glands: No mass or tenderness Facial Strength: Facial motility symmetric and full bilaterally ENT Pinna: External ear intact and fully developed External canal: Canal is patent with intact skin Tympanic Membrane: Clear and mobile External Nose: No scar or anatomic deformity Internal Nose: Septum is relatively straight. No polyp, or purulence. Mucosal edema and erythema present.  Lips teeth: edentulous, no mucosal injury along upper lip vestibule once dentures are removed Oral/Oropharynx: no exudate or erythema Neck Neck and Trachea: Midline trachea without mass or lesion Thyroid : No mass or nodularity Lymphatics: No lymphadenopathy   Procedure: Needle aspiration of the left cheek fullness suspected hematoma   After informed consent and injection of 0.2 ml of 1% lido with epi 1:100,000 for local anesthesia, an 18-gauge needle on 10 ml syringe was used to attempt and aspirate contents of the left cheek fullness. No  aspirate was obtained. There was no blood or purulent drainage in the syringe. Patient tolerated the procedure well. There was good hemostasis. Small amount of antibiotic ointment was applied.    Studies Reviewed: CT Max/face 10/24/23 CLINICAL DATA:  Fall with left-sided facial swelling.   EXAM: CT MAXILLOFACIAL WITHOUT CONTRAST   TECHNIQUE: Multidetector CT imaging of the maxillofacial structures was performed. Multiplanar CT image reconstructions were also generated.   RADIATION DOSE REDUCTION: This exam was performed according to the departmental dose-optimization program which includes automated exposure control, adjustment of the mA and/or kV according to patient size and/or use of iterative reconstruction technique.   COMPARISON:  08/19/2022   FINDINGS: Osseous: No acute fracture or mandibular dislocation. The maxilla is edentulous. TMJ osteoarthritis symmetrically. Cervical  spine degeneration with C4-5 ACDF.   Orbits: No evidence of injury.  Bilateral cataract resection   Sinuses: Negative for hemosinus   Soft tissues: Ovoid subcutaneous opacity in the left cheek measuring 23 x 8 mm. Ventrally there is an internal 6 mm gas density, reportedly the injury was 10/11/2023. The adjacent skin and fat does not appear reticulated to strongly implicate infection.   Limited intracranial: No evidence of injury   IMPRESSION: 23 x 8 mm collection in the subcutaneous left cheek, history suggesting subacute hematoma. There is an internal 6 mm gas density, unexpected for laceration related gas given the multiple weeks since injury - gas containing foreign body, especially woody, could give this appearance. Negative for facial fracture.    Assessment/Plan: Encounter Diagnoses  Name Primary?   Fall, initial encounter    Facial swelling    Facial injury, initial encounter    Hematoma and contusion Yes     Assessment and Plan    Facial trauma with Possible Persistent  Hematoma  The patient experienced a fall around Christmas, resulting in a facial injury. The left cheek with initial swelling and pain, both improved over time. Patient reports some fullness in the area, CT ordered by PCP with evidence of fluid collection and concern for retained foreign body/splinter. Differential diagnosis includes a hematoma or a possible foreign body, though the latter is unlikely since there are no signs or symptoms of infection. She reports her pain improved, and swelling has decreased significantly, and on exam there is no erythema, edema, and based on hx she did not have lacerations that would indicate she could have had a foreign body lodge in her cheek. Conservative treatment with antibiotics and steroids is planned. Discussed benefits of conservative management, and low likelihood of a foreign body based on hx and her sx progression. Repeat imaging or procedural intervention may be considered if symptoms worsen. Will attempt to needle aspirate if her sx persist.  - Prescribed doxycycline  10 mg BID for two weeks - Prescribe a steroid dose pack - Advise taking antibiotics with yogurt or a probiotic to prevent diarrhea - Recommend warm compresses or ice packs for comfort - Schedule follow-up appointment in eight weeks - Advise calling the office if symptoms worsen or new symptoms develop - will consider CT neck or MRI neck on return if sx persist   Update 02/11/2024 Assessment and Plan Assessment & Plan Facial swelling post-fall possible hematoma, which appears to have resolved at this point.   Improved facial swelling with decreased tenderness and reassuring exam today. Needle aspiration showed no aspirate, indicating no collection. Possible residual blood clot affecting soft tissue shape due to skin laxity. No fracture on initial scan. Monitoring condition without further imaging. -  doxycycline  for 5 days to prevent infection post-aspiration. - Advised to use of ice pack  to reduce potential swelling post-procedure. - Monitor for signs of infection or increased swelling and call the office if any noted. - Consider further imaging if symptoms persist or worsen. - RTC PRN if sx continue to improve     Artice Last, MD Otolaryngology [facility] Phone: [phone] Fax: [phone]    02/11/2024, 8:21 PM

## 2024-02-25 NOTE — Telephone Encounter (Signed)
 Patient had geniculate artery embolization on 01/02/24 and reports significant improvement with resolution of knee pain   Not running patient unless patient asks for gel injections.

## 2024-03-21 ENCOUNTER — Other Ambulatory Visit: Payer: Self-pay

## 2024-03-21 ENCOUNTER — Encounter (HOSPITAL_BASED_OUTPATIENT_CLINIC_OR_DEPARTMENT_OTHER): Payer: Self-pay

## 2024-03-21 ENCOUNTER — Emergency Department (HOSPITAL_BASED_OUTPATIENT_CLINIC_OR_DEPARTMENT_OTHER)
Admission: EM | Admit: 2024-03-21 | Discharge: 2024-03-21 | Disposition: A | Discharge status: Home / Self Care | Attending: Emergency Medicine | Admitting: Emergency Medicine

## 2024-03-21 ENCOUNTER — Emergency Department (HOSPITAL_BASED_OUTPATIENT_CLINIC_OR_DEPARTMENT_OTHER)

## 2024-03-21 DIAGNOSIS — W01198A Fall on same level from slipping, tripping and stumbling with subsequent striking against other object, initial encounter: Secondary | ICD-10-CM | POA: Diagnosis not present

## 2024-03-21 DIAGNOSIS — W19XXXA Unspecified fall, initial encounter: Secondary | ICD-10-CM

## 2024-03-21 DIAGNOSIS — S0990XA Unspecified injury of head, initial encounter: Secondary | ICD-10-CM | POA: Insufficient documentation

## 2024-03-21 DIAGNOSIS — M542 Cervicalgia: Secondary | ICD-10-CM | POA: Insufficient documentation

## 2024-03-21 DIAGNOSIS — N3000 Acute cystitis without hematuria: Secondary | ICD-10-CM

## 2024-03-21 DIAGNOSIS — S0083XA Contusion of other part of head, initial encounter: Secondary | ICD-10-CM | POA: Diagnosis not present

## 2024-03-21 DIAGNOSIS — Y92009 Unspecified place in unspecified non-institutional (private) residence as the place of occurrence of the external cause: Secondary | ICD-10-CM | POA: Insufficient documentation

## 2024-03-21 DIAGNOSIS — S199XXA Unspecified injury of neck, initial encounter: Secondary | ICD-10-CM | POA: Diagnosis not present

## 2024-03-21 DIAGNOSIS — R531 Weakness: Secondary | ICD-10-CM | POA: Diagnosis not present

## 2024-03-21 DIAGNOSIS — S41112A Laceration without foreign body of left upper arm, initial encounter: Secondary | ICD-10-CM | POA: Insufficient documentation

## 2024-03-21 DIAGNOSIS — S4992XA Unspecified injury of left shoulder and upper arm, initial encounter: Secondary | ICD-10-CM | POA: Diagnosis not present

## 2024-03-21 DIAGNOSIS — S139XXA Sprain of joints and ligaments of unspecified parts of neck, initial encounter: Secondary | ICD-10-CM

## 2024-03-21 LAB — CBC WITH DIFFERENTIAL/PLATELET
Abs Immature Granulocytes: 0.02 10*3/uL (ref 0.00–0.07)
Basophils Absolute: 0.1 10*3/uL (ref 0.0–0.1)
Basophils Relative: 1 %
Eosinophils Absolute: 0.2 10*3/uL (ref 0.0–0.5)
Eosinophils Relative: 3 %
HCT: 35.8 % — ABNORMAL LOW (ref 36.0–46.0)
Hemoglobin: 11.5 g/dL — ABNORMAL LOW (ref 12.0–15.0)
Immature Granulocytes: 0 %
Lymphocytes Relative: 31 %
Lymphs Abs: 2.2 10*3/uL (ref 0.7–4.0)
MCH: 29 pg (ref 26.0–34.0)
MCHC: 32.1 g/dL (ref 30.0–36.0)
MCV: 90.4 fL (ref 80.0–100.0)
Monocytes Absolute: 0.6 10*3/uL (ref 0.1–1.0)
Monocytes Relative: 8 %
Neutro Abs: 4.1 10*3/uL (ref 1.7–7.7)
Neutrophils Relative %: 57 %
Platelets: 267 10*3/uL (ref 150–400)
RBC: 3.96 MIL/uL (ref 3.87–5.11)
RDW: 14.2 % (ref 11.5–15.5)
WBC: 7.1 10*3/uL (ref 4.0–10.5)
nRBC: 0 % (ref 0.0–0.2)

## 2024-03-21 LAB — URINALYSIS, ROUTINE W REFLEX MICROSCOPIC
Bilirubin Urine: NEGATIVE
Glucose, UA: NEGATIVE mg/dL
Hgb urine dipstick: NEGATIVE
Ketones, ur: NEGATIVE mg/dL
Nitrite: NEGATIVE
Specific Gravity, Urine: 1.02 (ref 1.005–1.030)
pH: 5.5 (ref 5.0–8.0)

## 2024-03-21 LAB — BASIC METABOLIC PANEL WITH GFR
Anion gap: 14 (ref 5–15)
BUN: 34 mg/dL — ABNORMAL HIGH (ref 8–23)
CO2: 20 mmol/L — ABNORMAL LOW (ref 22–32)
Calcium: 9.2 mg/dL (ref 8.9–10.3)
Chloride: 108 mmol/L (ref 98–111)
Creatinine, Ser: 1.69 mg/dL — ABNORMAL HIGH (ref 0.44–1.00)
GFR, Estimated: 29 mL/min — ABNORMAL LOW (ref >60)
Glucose, Bld: 85 mg/dL (ref 70–99)
Potassium: 3.8 mmol/L (ref 3.5–5.1)
Sodium: 142 mmol/L (ref 135–145)

## 2024-03-21 MED ORDER — CEPHALEXIN 500 MG PO CAPS: 500.0000 mg | ORAL_CAPSULE | Freq: Three times a day (TID) | ORAL | 0 refills | Status: DC

## 2024-03-21 MED ORDER — CEPHALEXIN 250 MG PO CAPS
500.0000 mg | ORAL_CAPSULE | Freq: Once | ORAL | Status: AC
  Administered 2024-03-21: 500 mg via ORAL
  Filled 2024-03-21: qty 2

## 2024-03-21 NOTE — ED Triage Notes (Signed)
 Pt coming in with fall tonight. Pt hit back right part of head on linoleum floor at home. Pt reports fall was mechanical and that she tripped but also admits to feeling weak. Skin tears noted on left arm. Pt awake and alert. Pt denies any pain. Bruising noted on left side of face.(-) thinners.

## 2024-03-21 NOTE — Discharge Instructions (Addendum)
 Begin taking Keflex  as prescribed.  Local wound care with bacitracin  and dressing changes twice daily.  Return to the ER if you develop severe headache, or other new and concerning symptoms.

## 2024-03-21 NOTE — ED Provider Notes (Signed)
 [place] EMERGENCY DEPARTMENT AT [facility] Provider Note   CSN: [id number] Arrival date & time: 03/21/24  0222     History  Chief Complaint  Patient presents with   Bridget Lane is a 89 y.o. female.  Patient is an 89 year old female presenting for evaluation of fall.  She got up to walk to the bathroom, then tripped over the power cord of a fan.  She fell forward and hit her head and left arm.  She does not believe she was knocked unconscious.  She denies headache.  She does have some neck discomfort.  She also has skin tears and contusion noted to her left upper arm.       Home Medications Prior to Admission medications   Medication Sig Start Date End Date Taking? Authorizing Provider  Brexpiprazole  (REXULTI ) 0.5 MG TABS Take 1 tablet (0.5 mg total) by mouth daily. 11/20/23   Almira Jaeger, MD  busPIRone  (BUSPAR ) 5 MG tablet Take 1 tablet (5 mg total) by mouth 2 (two) times daily as needed (Anxiety). 08/05/23   Almira Jaeger, MD  doxycycline  (VIBRA -TABS) 100 MG tablet Take 1 tablet (100 mg total) by mouth 2 (two) times daily. 02/11/24   Soldatova, Liuba, MD  DULoxetine  (CYMBALTA ) 30 MG capsule Take 2 capsules (60 mg total) by mouth daily. 08/05/23   Almira Jaeger, MD  flecainide  (TAMBOCOR ) 100 MG tablet TAKE ONE TABLET BY MOUTH TWICE DAILY 11/18/23   Almira Jaeger, MD  methylPREDNISolone  (MEDROL  DOSEPAK) 4 MG TBPK tablet Take with signs of chronic sinusitis and take as directed Patient not taking: Reported on 02/11/2024 12/11/23   Soldatova, Liuba, MD  metoprolol  succinate (TOPROL -XL) 25 MG 24 hr tablet TAKE 1/2 TABLET BY MOUTH TWICE DAILY 12/04/23   Almira Jaeger, MD  Multiple Vitamins-Minerals (MULTI FOR HER 50+) TABS Take 1 tablet by mouth daily.    [provider]  Multiple Vitamins-Minerals (PRESERVISION AREDS 2+MULTI VIT PO) Take 1 tablet by mouth in the morning and at bedtime.    [provider]  omeprazole  (PRILOSEC)  40 MG capsule TAKE ONE (1) CAPSULE BY MOUTH TWICE DAILY 11/05/23   Almira Jaeger, MD  Polyethyl Glycol-Propyl Glycol 0.4-0.3 % SOLN Apply 1-2 drops to eye daily as needed (stye, dryness).    [provider]  rivastigmine  (EXELON ) 1.5 MG capsule Take 1 capsule (1.5 mg total) by mouth 2 (two) times daily. Patient not taking: Reported on 10/20/2023 04/28/23 11/24/23  Camara, Amadou, MD  rosuvastatin  (CRESTOR ) 5 MG tablet TAKE ONE TABLET BY MOUTH IN THE EVENING 10/03/23   Almira Jaeger, MD      Allergies    Penicillins, Codeine, Codeine, Dabigatran  etexilate mesylate, Dilaudid  Freeman.Furry ], Dabigatran  etexilate mesylate, Dabigatran  etexilate mesylate, Penicillins, Sulfa antibiotics, and Sulfonamide derivatives    Review of Systems   Review of Systems  All other systems reviewed and are negative.   Physical Exam Updated Vital Signs BP (!) 146/56   Pulse 83   Temp 97.7 F (36.5 C) (Oral)   Resp 18   Ht 5\' 4"  (1.626 m)   Wt 72.6 kg   SpO2 100%   BMI 27.46 kg/m  Physical Exam Vitals and nursing note reviewed.  Constitutional:      General: She is not in acute distress.    Appearance: She is well-developed. She is not diaphoretic.  HENT:     Head: Normocephalic and atraumatic.  Neck:     Comments: There is  tenderness in the soft tissues of the cervical region.  No bony tenderness or step-off. Cardiovascular:     Rate and Rhythm: Normal rate and regular rhythm.     Heart sounds: No murmur heard.    No friction rub. No gallop.  Pulmonary:     Effort: Pulmonary effort is normal. No respiratory distress.     Breath sounds: Normal breath sounds. No wheezing.  Abdominal:     General: Bowel sounds are normal. There is no distension.     Palpations: Abdomen is soft.     Tenderness: There is no abdominal tenderness.  Musculoskeletal:        General: Normal range of motion.     Cervical back: Normal range of motion and neck supple.     Comments: There are 2 skin tears  noted to the anterior aspect of her upper arm.  There is also a contusion noted to the left upper arm just below the shoulder.  She has good range of motion in the arm is neurovascularly intact.  Skin:    General: Skin is warm and dry.  Neurological:     General: No focal deficit present.     Mental Status: She is alert and oriented to person, place, and time.     ED Results / Procedures / Treatments   Labs (all labs ordered are listed, but only abnormal results are displayed) Labs Reviewed  BASIC METABOLIC PANEL WITH GFR  CBC WITH DIFFERENTIAL/PLATELET  URINALYSIS, ROUTINE W REFLEX MICROSCOPIC    EKG None  Radiology No results found.  Procedures Procedures    Medications Ordered in ED Medications - No data to display  ED Course/ Medical Decision Making/ A&P  Patient is a 89 year old female presenting after a fall that occurred at home.  She reports striking her head and has some discomfort in her neck.  She arrives here with stable vital signs and is neurologically intact.  Laboratory studies obtained including CBC and basic metabolic panel, both of which are unremarkable.  Urinalysis is suggestive of an infection.  CT scan of the head and cervical spine are negative for intracranial injury or fracture.  Patient will be treated with Keflex  for the UTI and as needed return.  The skin tears to the left arm have been dressed and antibiotic ointment applied.  Final Clinical Impression(s) / ED Diagnoses Final diagnoses:  None    Rx / DC Orders ED Discharge Orders     None         Orvilla Blander, MD 03/21/24 [phone]

## 2024-03-26 ENCOUNTER — Ambulatory Visit: Admitting: Family Medicine

## 2024-04-14 DIAGNOSIS — H353124 Nonexudative age-related macular degeneration, left eye, advanced atrophic with subfoveal involvement: Secondary | ICD-10-CM | POA: Diagnosis not present

## 2024-04-14 DIAGNOSIS — H353113 Nonexudative age-related macular degeneration, right eye, advanced atrophic without subfoveal involvement: Secondary | ICD-10-CM | POA: Diagnosis not present

## 2024-04-14 DIAGNOSIS — H43813 Vitreous degeneration, bilateral: Secondary | ICD-10-CM | POA: Diagnosis not present

## 2024-04-14 DIAGNOSIS — H35033 Hypertensive retinopathy, bilateral: Secondary | ICD-10-CM | POA: Diagnosis not present

## 2024-04-29 ENCOUNTER — Telehealth: Payer: Self-pay | Admitting: Family Medicine

## 2024-04-29 NOTE — Telephone Encounter (Signed)
 Patients daughter called to schedule an appointment but she also said after her moms procedure her knee is bigger and swollen and it is giving her a really hard time. She asked if she could be worked in somewhere and if not should she take her to the emergency room. She has been put on a cancellation list for Dr. Joane and scheduled for next week. Please advise.

## 2024-04-29 NOTE — Telephone Encounter (Signed)
 Let see if we can work her in tomorrow at [employer]

## 2024-04-29 NOTE — Telephone Encounter (Signed)
 Last OV 12/09/23, received Triamcinolone  injection. Had GAE 01/02/24.   Forwarding to Dr. Joane to review and advise.

## 2024-04-30 ENCOUNTER — Other Ambulatory Visit: Payer: Self-pay | Admitting: Family Medicine

## 2024-04-30 NOTE — Telephone Encounter (Signed)
 Noted

## 2024-05-05 ENCOUNTER — Ambulatory Visit: Admitting: Family Medicine

## 2024-05-05 ENCOUNTER — Other Ambulatory Visit: Payer: Self-pay

## 2024-05-05 ENCOUNTER — Encounter: Payer: Self-pay | Admitting: Neurology

## 2024-05-05 ENCOUNTER — Ambulatory Visit (INDEPENDENT_AMBULATORY_CARE_PROVIDER_SITE_OTHER)

## 2024-05-05 ENCOUNTER — Ambulatory Visit: Payer: Medicare Other | Admitting: Neurology

## 2024-05-05 VITALS — BP 138/74 | HR 62 | Ht 64.0 in | Wt 153.0 lb

## 2024-05-05 VITALS — BP 138/74 | Ht 64.0 in | Wt 152.5 lb

## 2024-05-05 DIAGNOSIS — M25561 Pain in right knee: Secondary | ICD-10-CM

## 2024-05-05 DIAGNOSIS — G8929 Other chronic pain: Secondary | ICD-10-CM | POA: Diagnosis not present

## 2024-05-05 DIAGNOSIS — M1711 Unilateral primary osteoarthritis, right knee: Secondary | ICD-10-CM | POA: Diagnosis not present

## 2024-05-05 DIAGNOSIS — F02A0 Dementia in other diseases classified elsewhere, mild, without behavioral disturbance, psychotic disturbance, mood disturbance, and anxiety: Secondary | ICD-10-CM | POA: Diagnosis not present

## 2024-05-05 DIAGNOSIS — G301 Alzheimer's disease with late onset: Secondary | ICD-10-CM | POA: Diagnosis not present

## 2024-05-05 MED ORDER — RIVASTIGMINE TARTRATE 3 MG PO CAPS
3.0000 mg | ORAL_CAPSULE | Freq: Two times a day (BID) | ORAL | 3 refills | Status: AC
Start: 2024-05-05 — End: 2025-04-30

## 2024-05-05 NOTE — Patient Instructions (Addendum)
 Thank you for coming in today.   Reminder: Dr. Joane will be out of the office starting August 1st, for about 6 weeks  Please get an Xray today before you leave   Recheck in 3 months.

## 2024-05-05 NOTE — Patient Instructions (Addendum)
 Increase rivastigmine  to 3 mg twice daily Continue your other medications Continue follow-up PCP Return 1 year or sooner if worse.

## 2024-05-05 NOTE — Progress Notes (Unsigned)
 LILLETTE Ileana Collet, PhD, LAT, ATC acting as a scribe for Artist Lloyd, MD.  Bridget Lane is a 89 y.o. female who presents to [facility] today for cont'd R knee pain. Pt was last seen by Dr. Lloyd on 12/09/23 and was given a R knee steroid injection and was referred for GAE consideration. Procedure done on 01/02/24, R knee.   Of note, pt suffered a fall on 6/1 and was seen at the ED following. She got up to walk to the bathroom, then tripped over the power cord of a fan.   Today, pt's daughter reports pt has suffered several falls. Daughter has been giving her Tylenol  PM instead of benadryl  to help her sleep. Daughter thinks her R knee got worse from GAE. R knee is very painful today.  Dx imaging: 12/09/23 R knee XR 03/13/22 R knee XR 11/05/21 R & L knee XR 07/19/20 R & L knee XR  Pertinent review of systems: No fevers or chills  Relevant historical information: Right knee osteoarthritis.  Patient had a genicular artery embolization right knee in March.  Mild dementia   Exam:  BP 138/74   Pulse 62   Ht 5' 4 (1.626 m)   Wt 153 lb (69.4 kg)   SpO2 95%   BMI 26.26 kg/m  General: Well Developed, well nourished, and in no acute distress.   MSK: Right knee considerable genu valgus.  Laxity LCL stress test.  Intact strength.  Antalgic gait.    Lab and Radiology Results  Procedure: Real-time Ultrasound Guided Injection of right knee joint superior lateral patella space Device: Philips Affiniti 50G/GE Logiq Images permanently stored and available for review in PACS Verbal informed consent obtained.  Discussed risks and benefits of procedure. Warned about infection, bleeding, hyperglycemia damage to structures among others. Patient expresses understanding and agreement Time-out conducted.   Noted no overlying erythema, induration, or other signs of local infection.   Skin prepped in a sterile fashion.   Local anesthesia: Topical Ethyl chloride.   With  sterile technique and under real time ultrasound guidance: 40 mg of Kenalog  and 2 mL of Marcaine  injected into knee joint. Fluid seen entering the joint capsule.   Completed without difficulty   Pain immediately resolved suggesting accurate placement of the medication.   Advised to call if fevers/chills, erythema, induration, drainage, or persistent bleeding.   Images permanently stored and available for review in the ultrasound unit.  Impression: Technically successful ultrasound guided injection.  X-ray images right knee obtained today personally and independently interpreted Severe lateral DJD. Await formal radiology review     Assessment and Plan: 89 y.o. female with right knee pain due to severe lateral DJD.  Plan for steroid injection today.  Recheck in 3 months anticipate steroid injection and then 2.  I think with time she will continue to experience benefit from the genicular artery embolization.  Will also try using a lateral offloading knee brace.  This will help with the arthritis related pain and instability seen on exam.   PDMP not reviewed this encounter. Orders Placed This Encounter  Procedures   US  LIMITED JOINT SPACE STRUCTURES LOW RIGHT(NO LINKED CHARGES)    Reason for Exam (SYMPTOM  OR DIAGNOSIS REQUIRED):   right knee pain    Preferred imaging location?:   [facility] Knee AP/LAT W/Sunrise Right    Standing Status:   Future    Number of Occurrences:   1  Expiration Date:   06/05/2024    Reason for Exam (SYMPTOM  OR DIAGNOSIS REQUIRED):   right knee pain    Preferred imaging location?:   [facility]   No orders of the defined types were placed in this encounter.    Discussed warning signs or symptoms. Please see discharge instructions. Patient expresses understanding.   The above documentation has been reviewed and is accurate and complete Artist Lloyd, M.D.
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
## 2024-05-09 ENCOUNTER — Ambulatory Visit: Payer: Self-pay | Admitting: Family Medicine

## 2024-05-09 NOTE — Progress Notes (Signed)
 Right knee x-ray shows arthritis

## 2024-06-02 ENCOUNTER — Ambulatory Visit: Payer: Medicare Other | Admitting: Neurology

## 2024-06-08 ENCOUNTER — Ambulatory Visit: Admitting: Family Medicine

## 2024-06-08 ENCOUNTER — Telehealth: Payer: Self-pay | Admitting: Family Medicine

## 2024-06-08 NOTE — Telephone Encounter (Signed)
 Pt has No Showed for 3rd time. Please advise

## 2024-06-08 NOTE — Telephone Encounter (Signed)
 Dr. Katrinka stated she has dementia. Not sure how you all would like to proceed.

## 2024-06-17 ENCOUNTER — Encounter: Payer: Self-pay | Admitting: Neurology

## 2024-06-26 ENCOUNTER — Emergency Department (HOSPITAL_BASED_OUTPATIENT_CLINIC_OR_DEPARTMENT_OTHER): Admitting: Radiology

## 2024-06-26 ENCOUNTER — Emergency Department (HOSPITAL_BASED_OUTPATIENT_CLINIC_OR_DEPARTMENT_OTHER)

## 2024-06-26 ENCOUNTER — Other Ambulatory Visit: Payer: Self-pay

## 2024-06-26 ENCOUNTER — Emergency Department (HOSPITAL_BASED_OUTPATIENT_CLINIC_OR_DEPARTMENT_OTHER)
Admission: EM | Admit: 2024-06-26 | Discharge: 2024-06-26 | Disposition: A | Discharge status: Home / Self Care | Attending: Emergency Medicine | Admitting: Emergency Medicine

## 2024-06-26 DIAGNOSIS — S51011A Laceration without foreign body of right elbow, initial encounter: Secondary | ICD-10-CM | POA: Insufficient documentation

## 2024-06-26 DIAGNOSIS — M19031 Primary osteoarthritis, right wrist: Secondary | ICD-10-CM | POA: Diagnosis not present

## 2024-06-26 DIAGNOSIS — R911 Solitary pulmonary nodule: Secondary | ICD-10-CM | POA: Insufficient documentation

## 2024-06-26 DIAGNOSIS — S61411A Laceration without foreign body of right hand, initial encounter: Secondary | ICD-10-CM | POA: Insufficient documentation

## 2024-06-26 DIAGNOSIS — W1830XA Fall on same level, unspecified, initial encounter: Secondary | ICD-10-CM | POA: Insufficient documentation

## 2024-06-26 DIAGNOSIS — Z043 Encounter for examination and observation following other accident: Secondary | ICD-10-CM | POA: Diagnosis not present

## 2024-06-26 DIAGNOSIS — F0390 Unspecified dementia without behavioral disturbance: Secondary | ICD-10-CM | POA: Insufficient documentation

## 2024-06-26 DIAGNOSIS — K5730 Diverticulosis of large intestine without perforation or abscess without bleeding: Secondary | ICD-10-CM | POA: Diagnosis not present

## 2024-06-26 DIAGNOSIS — G8929 Other chronic pain: Secondary | ICD-10-CM | POA: Insufficient documentation

## 2024-06-26 DIAGNOSIS — M25561 Pain in right knee: Secondary | ICD-10-CM | POA: Insufficient documentation

## 2024-06-26 DIAGNOSIS — Y9301 Activity, walking, marching and hiking: Secondary | ICD-10-CM | POA: Insufficient documentation

## 2024-06-26 DIAGNOSIS — I700 Atherosclerosis of aorta: Secondary | ICD-10-CM | POA: Insufficient documentation

## 2024-06-26 DIAGNOSIS — S20212A Contusion of left front wall of thorax, initial encounter: Secondary | ICD-10-CM | POA: Insufficient documentation

## 2024-06-26 DIAGNOSIS — Z981 Arthrodesis status: Secondary | ICD-10-CM | POA: Diagnosis not present

## 2024-06-26 DIAGNOSIS — D649 Anemia, unspecified: Secondary | ICD-10-CM | POA: Diagnosis not present

## 2024-06-26 DIAGNOSIS — G319 Degenerative disease of nervous system, unspecified: Secondary | ICD-10-CM | POA: Diagnosis not present

## 2024-06-26 DIAGNOSIS — S0990XA Unspecified injury of head, initial encounter: Secondary | ICD-10-CM

## 2024-06-26 DIAGNOSIS — D72829 Elevated white blood cell count, unspecified: Secondary | ICD-10-CM | POA: Diagnosis not present

## 2024-06-26 DIAGNOSIS — W19XXXA Unspecified fall, initial encounter: Secondary | ICD-10-CM

## 2024-06-26 DIAGNOSIS — I6782 Cerebral ischemia: Secondary | ICD-10-CM | POA: Diagnosis not present

## 2024-06-26 DIAGNOSIS — I2510 Atherosclerotic heart disease of native coronary artery without angina pectoris: Secondary | ICD-10-CM | POA: Diagnosis not present

## 2024-06-26 DIAGNOSIS — S0011XA Contusion of right eyelid and periocular area, initial encounter: Secondary | ICD-10-CM | POA: Diagnosis not present

## 2024-06-26 DIAGNOSIS — R9431 Abnormal electrocardiogram [ECG] [EKG]: Secondary | ICD-10-CM | POA: Diagnosis not present

## 2024-06-26 DIAGNOSIS — Z79899 Other long term (current) drug therapy: Secondary | ICD-10-CM | POA: Insufficient documentation

## 2024-06-26 DIAGNOSIS — T07XXXA Unspecified multiple injuries, initial encounter: Secondary | ICD-10-CM

## 2024-06-26 DIAGNOSIS — M85841 Other specified disorders of bone density and structure, right hand: Secondary | ICD-10-CM | POA: Diagnosis not present

## 2024-06-26 DIAGNOSIS — S2020XA Contusion of thorax, unspecified, initial encounter: Secondary | ICD-10-CM | POA: Diagnosis not present

## 2024-06-26 DIAGNOSIS — M19041 Primary osteoarthritis, right hand: Secondary | ICD-10-CM | POA: Diagnosis not present

## 2024-06-26 DIAGNOSIS — M7989 Other specified soft tissue disorders: Secondary | ICD-10-CM | POA: Diagnosis not present

## 2024-06-26 LAB — COMPREHENSIVE METABOLIC PANEL WITH GFR
ALT: 16 U/L (ref 0–44)
AST: 28 U/L (ref 15–41)
Albumin: 4.2 g/dL (ref 3.5–5.0)
Alkaline Phosphatase: 85 U/L (ref 38–126)
Anion gap: 15 (ref 5–15)
BUN: 24 mg/dL — ABNORMAL HIGH (ref 8–23)
CO2: 18 mmol/L — ABNORMAL LOW (ref 22–32)
Calcium: 9.9 mg/dL (ref 8.9–10.3)
Chloride: 109 mmol/L (ref 98–111)
Creatinine, Ser: 1.27 mg/dL — ABNORMAL HIGH (ref 0.44–1.00)
GFR, Estimated: 40 mL/min — ABNORMAL LOW (ref >60)
Glucose, Bld: 94 mg/dL (ref 70–99)
Potassium: 3.7 mmol/L (ref 3.5–5.1)
Sodium: 143 mmol/L (ref 135–145)
Total Bilirubin: 0.6 mg/dL (ref 0.0–1.2)
Total Protein: 6.9 g/dL (ref 6.5–8.1)

## 2024-06-26 LAB — CBC WITH DIFFERENTIAL/PLATELET
Abs Immature Granulocytes: 0.03 K/uL (ref 0.00–0.07)
Basophils Absolute: 0.1 K/uL (ref 0.0–0.1)
Basophils Relative: 1 %
Eosinophils Absolute: 0.2 K/uL (ref 0.0–0.5)
Eosinophils Relative: 2 %
HCT: 33.7 % — ABNORMAL LOW (ref 36.0–46.0)
Hemoglobin: 11 g/dL — ABNORMAL LOW (ref 12.0–15.0)
Immature Granulocytes: 0 %
Lymphocytes Relative: 29 %
Lymphs Abs: 2.4 K/uL (ref 0.7–4.0)
MCH: 29 pg (ref 26.0–34.0)
MCHC: 32.6 g/dL (ref 30.0–36.0)
MCV: 88.9 fL (ref 80.0–100.0)
Monocytes Absolute: 0.6 K/uL (ref 0.1–1.0)
Monocytes Relative: 7 %
Neutro Abs: 5 K/uL (ref 1.7–7.7)
Neutrophils Relative %: 61 %
Platelets: 298 K/uL (ref 150–400)
RBC: 3.79 MIL/uL — ABNORMAL LOW (ref 3.87–5.11)
RDW: 14.6 % (ref 11.5–15.5)
WBC: 8.2 K/uL (ref 4.0–10.5)
nRBC: 0 % (ref 0.0–0.2)

## 2024-06-26 LAB — URINALYSIS, ROUTINE W REFLEX MICROSCOPIC
Bacteria, UA: NONE SEEN
Bilirubin Urine: NEGATIVE
Glucose, UA: NEGATIVE mg/dL
Hgb urine dipstick: NEGATIVE
Ketones, ur: NEGATIVE mg/dL
Nitrite: NEGATIVE
Protein, ur: NEGATIVE mg/dL
Specific Gravity, Urine: 1.019 (ref 1.005–1.030)
pH: 5.5 (ref 5.0–8.0)

## 2024-06-26 LAB — TROPONIN T, HIGH SENSITIVITY
Troponin T High Sensitivity: 21 ng/L — ABNORMAL HIGH (ref 0–19)
Troponin T High Sensitivity: 22 ng/L — ABNORMAL HIGH (ref 0–19)

## 2024-06-26 LAB — ACETAMINOPHEN LEVEL: Acetaminophen (Tylenol), Serum: <10 ug/mL — ABNORMAL LOW (ref 10–30)

## 2024-06-26 MED ORDER — IOHEXOL 300 MG/ML  SOLN
80.0000 mL | Freq: Once | INTRAMUSCULAR | Status: AC | PRN
  Administered 2024-06-26: 80 mL via INTRAVENOUS

## 2024-06-26 MED ORDER — LIDOCAINE-EPINEPHRINE-TETRACAINE (LET) TOPICAL GEL
3.0000 mL | Freq: Once | TOPICAL | Status: AC
  Administered 2024-06-26: 3 mL via TOPICAL
  Filled 2024-06-26: qty 3

## 2024-06-26 MED ORDER — LACTATED RINGERS IV BOLUS
500.0000 mL | Freq: Once | INTRAVENOUS | Status: AC
  Administered 2024-06-26: 500 mL via INTRAVENOUS

## 2024-06-26 MED ORDER — CEFPODOXIME PROXETIL 200 MG PO TABS: 200.0000 mg | ORAL_TABLET | Freq: Two times a day (BID) | ORAL | 0 refills | Status: AC

## 2024-06-26 NOTE — ED Triage Notes (Signed)
 Mechanical fall last night and this morning. States hit head over floor. Denies LOC. No thinners. Skin tear to R hand and R elbow.

## 2024-06-26 NOTE — ED Provider Notes (Signed)
 [place] [facility]PARTMENT AT [facility] Provider Note   CSN: [id number] Arrival date & time: 06/26/24  1302     Patient presents with: Bridget Lane Bridget Lane is a 89 y.o. female.  {Add pertinent medical, surgical, social history, OB history to [id number]}  Fall       Prior to Admission medications   Medication Sig Start Date End Date Taking? Authorizing Provider  Brexpiprazole  (REXULTI ) 0.5 MG TABS Take 1 tablet (0.5 mg total) by mouth daily. 11/20/23   Katrinka Garnette KIDD, MD  busPIRone  (BUSPAR ) 5 MG tablet TAKE ONE (1) TABLET BY MOUTH TWICE DAILY AS NEEDED FOR ANXIETY 05/03/24   Katrinka Garnette KIDD, MD  cephALEXin  (KEFLEX ) 500 MG capsule Take 1 capsule (500 mg total) by mouth 3 (three) times daily. 03/21/24   Geroldine Berg, MD  doxycycline  (VIBRA -TABS) 100 MG tablet Take 1 tablet (100 mg total) by mouth 2 (two) times daily. 02/11/24   Soldatova, Liuba, MD  DULoxetine  (CYMBALTA ) 30 MG capsule Take 2 capsules (60 mg total) by mouth daily. 08/05/23   Katrinka Garnette KIDD, MD  flecainide  (TAMBOCOR ) 100 MG tablet TAKE ONE TABLET BY MOUTH TWICE DAILY 11/18/23   Katrinka Garnette KIDD, MD  methylPREDNISolone  (MEDROL  DOSEPAK) 4 MG TBPK tablet Take with signs of chronic sinusitis and take as directed Patient not taking: Reported on 02/11/2024 12/11/23   Soldatova, Liuba, MD  metoprolol  succinate (TOPROL -XL) 25 MG 24 hr tablet TAKE 1/2 TABLET BY MOUTH TWICE DAILY 12/04/23   Katrinka Garnette KIDD, MD  Multiple Vitamins-Minerals (MULTI FOR HER 50+) TABS Take 1 tablet by mouth daily.    [provider]  Multiple Vitamins-Minerals (PRESERVISION AREDS 2+MULTI VIT PO) Take 1 tablet by mouth in the morning and at bedtime.    [provider]  omeprazole  (PRILOSEC) 40 MG capsule TAKE ONE (1) CAPSULE BY MOUTH TWICE DAILY 11/05/23   Katrinka Garnette KIDD, MD  Polyethyl Glycol-Propyl Glycol 0.4-0.3 % SOLN Apply 1-2 drops to eye daily as needed (stye, dryness).    [provider]   rivastigmine  (EXELON ) 3 MG capsule Take 1 capsule (3 mg total) by mouth 2 (two) times daily. 05/05/24 04/30/25  Gregg Lek, MD  rosuvastatin  (CRESTOR ) 5 MG tablet TAKE ONE TABLET BY MOUTH IN THE EVENING 10/03/23   Katrinka Garnette KIDD, MD    Allergies: Penicillins, Codeine, Codeine, Dabigatran  etexilate mesylate, Dilaudid  [hydromorphone ], Dabigatran  etexilate mesylate, Dabigatran  etexilate mesylate, Penicillins, Sulfa antibiotics, and Sulfonamide derivatives    Review of Systems  Updated Vital Signs BP (!) 117/54 (BP Location: Left Arm)   Pulse (!) 55   Temp 98.1 F (36.7 C)   Resp 18   SpO2 100%   Physical Exam  (all labs ordered are listed, but only abnormal results are displayed) Labs Reviewed  CBC WITH DIFFERENTIAL/PLATELET - Abnormal; Notable for the following components:      Result Value   RBC 3.79 (*)    Hemoglobin 11.0 (*)    HCT 33.7 (*)    All other components within normal limits  COMPREHENSIVE METABOLIC PANEL WITH GFR - Abnormal; Notable for the following components:   CO2 18 (*)    BUN 24 (*)    Creatinine, Ser 1.27 (*)    GFR, Estimated 40 (*)    All other components within normal limits  ACETAMINOPHEN  LEVEL - Abnormal; Notable for the following components:   Acetaminophen  (Tylenol ), Serum <10 (*)    All other components within normal limits  URINALYSIS, ROUTINE W REFLEX MICROSCOPIC -  Abnormal; Notable for the following components:   Leukocytes,Ua MODERATE (*)    All other components within normal limits  TROPONIN T, HIGH SENSITIVITY - Abnormal; Notable for the following components:   Troponin T High Sensitivity 21 (*)    All other components within normal limits  URINE CULTURE  TROPONIN T, HIGH SENSITIVITY    EKG: None  Radiology: CT Head Wo Contrast Result Date: 06/26/2024 CLINICAL DATA:  Multiple recent falls. EXAM: CT HEAD WITHOUT CONTRAST CT CERVICAL SPINE WITHOUT CONTRAST TECHNIQUE: Multidetector CT imaging of the head and cervical spine was  performed following the standard protocol without intravenous contrast. Multiplanar CT image reconstructions of the cervical spine were also generated. RADIATION DOSE REDUCTION: This exam was performed according to the departmental dose-optimization program which includes automated exposure control, adjustment of the mA and/or kV according to patient size and/or use of iterative reconstruction technique. COMPARISON:  CT head and cervical spine 03/21/2024 FINDINGS: CT HEAD FINDINGS Brain: There is no evidence of an acute infarct, intracranial hemorrhage, mass, midline shift, or extra-axial fluid collection. There is mild cerebral atrophy. Cerebral white matter hypodensities are similar to the prior CT and are nonspecific but compatible with mild chronic small vessel ischemic disease. 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 Vascular: Calcified atherosclerosis at the skull base. No hyperdense vessel. Skull: No fracture or suspicious lesion. Sinuses/Orbits: Paranasal sinuses and mastoid air cells are clear. Bilateral cataract extraction. Other: None. CT CERVICAL SPINE FINDINGS Alignment: Unchanged grade 1 anterolisthesis of C3 on C4, C6 on C7, C7 on T1, T1 on T2, and T2 on T3. Skull base and vertebrae: No acute fracture or suspicious lesion. Similar appearance of asymmetrically severe left C1-2 arthropathy. Solid C4-C6 ACDF. Facet ankylosis bilaterally at C2-3 and C4-5 and on the right at C5-6. Soft tissues and spinal canal: No prevertebral fluid or swelling. No visible canal hematoma. Disc levels: Moderately advanced disc degeneration at C6-7. Widespread facet arthrosis. No evidence of high-grade spinal canal stenosis. Severe right neural foraminal stenosis at C3-4 due to uncovertebral and facet spurring. Upper chest: Biapical pleuroparenchymal lung scarring. Other: 1 cm left thyroid  nodule for which no follow-up imaging is recommended. IMPRESSION: 1. No evidence of acute intracranial abnormality or  cervical spine fracture. 2. Mild chronic small vessel ischemic disease. 3. Degenerative and postoperative changes in the cervical spine as above. Electronically Signed   By: Dasie Hamburg M.D.   On: 06/26/2024 14:12   CT Cervical Spine Wo Contrast Result Date: 06/26/2024 CLINICAL DATA:  Multiple recent falls. EXAM: CT HEAD WITHOUT CONTRAST CT CERVICAL SPINE WITHOUT CONTRAST TECHNIQUE: Multidetector CT imaging of the head and cervical spine was performed following the standard protocol without intravenous contrast. Multiplanar CT image reconstructions of the cervical spine were also generated. RADIATION DOSE REDUCTION: This exam was performed according to the departmental dose-optimization program which includes automated exposure control, adjustment of the mA and/or kV according to patient size and/or use of iterative reconstruction technique. COMPARISON:  CT head and cervical spine 03/21/2024 FINDINGS: CT HEAD FINDINGS Brain: There is no evidence of an acute infarct, intracranial hemorrhage, mass, midline shift, or extra-axial fluid collection. There is mild cerebral atrophy. Cerebral white matter hypodensities are similar to the prior CT and are nonspecific but compatible with mild chronic small vessel ischemic disease. 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 Vascular: Calcified atherosclerosis at the skull base. No hyperdense vessel. Skull: No fracture or suspicious lesion. Sinuses/Orbits: Paranasal sinuses and mastoid air cells are clear. Bilateral cataract extraction. Other:  None. CT CERVICAL SPINE FINDINGS Alignment: Unchanged grade 1 anterolisthesis of C3 on C4, C6 on C7, C7 on T1, T1 on T2, and T2 on T3. Skull base and vertebrae: No acute fracture or suspicious lesion. Similar appearance of asymmetrically severe left C1-2 arthropathy. Solid C4-C6 ACDF. Facet ankylosis bilaterally at C2-3 and C4-5 and on the right at C5-6. Soft tissues and spinal canal: No prevertebral fluid or swelling. No  visible canal hematoma. Disc levels: Moderately advanced disc degeneration at C6-7. Widespread facet arthrosis. No evidence of high-grade spinal canal stenosis. Severe right neural foraminal stenosis at C3-4 due to uncovertebral and facet spurring. Upper chest: Biapical pleuroparenchymal lung scarring. Other: 1 cm left thyroid  nodule for which no follow-up imaging is recommended. IMPRESSION: 1. No evidence of acute intracranial abnormality or cervical spine fracture. 2. Mild chronic small vessel ischemic disease. 3. Degenerative and postoperative changes in the cervical spine as above. Electronically Signed   By: Dasie Hamburg M.D.   On: 06/26/2024 14:12   DG Elbow Complete Right Result Date: 06/26/2024 CLINICAL DATA:  Fall, skin tears. EXAM: RIGHT ELBOW - COMPLETE 3+ VIEW COMPARISON:  None Available. FINDINGS: No acute osseous or joint abnormality. IMPRESSION: No acute osseous or joint abnormality. Electronically Signed   By: Newell Eke M.D.   On: 06/26/2024 14:09   DG Hand Complete Right Result Date: 06/26/2024 CLINICAL DATA:  Fall, swelling. EXAM: RIGHT HAND - COMPLETE 3+ VIEW COMPARISON:  None Available. FINDINGS: Extensive degenerative changes in the proximal and distal interphalangeal joints. Suspect Gull wing deformities in the second, third and fourth distal interphalangeal joints. Osteopenia. Degenerative changes at the first carpometacarpal and scaphoid trapezium trapezoid joints. No definite fracture. IMPRESSION: 1. No definite acute fracture. 2. 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 Electronically Signed   By: Newell Eke M.D.   On: 06/26/2024 14:08    {Document cardiac monitor, telemetry assessment procedure when appropriate:32947} .Laceration Repair  Date/Time: 06/26/2024 4:57 PM  Performed by: Bernis Ernst, PA-C Authorized by: Bernis Ernst, PA-C   Consent:    Consent obtained:  Verbal    Consent given by:  Patient   Risks, benefits, and alternatives were discussed: yes     Risks discussed:  Infection, pain, nerve damage, need for additional repair, retained foreign body, poor cosmetic result and tendon damage   Alternatives discussed:  No treatment Universal protocol:    Procedure explained and questions answered to patient or proxy's satisfaction: yes     Patient identity confirmed:  Verbally with patient Skin repair:    Repair method:  Steri-Strips   Number of Steri-Strips:  6 Approximation:    Approximation:  Loose Repair type:    Repair type:  Simple    Medications Ordered in the ED  lactated ringers  bolus 500 mL (has no administration in time range)  lidocaine -EPINEPHrine -tetracaine  (LET) topical gel (3 mLs Topical Given 06/26/24 1528)      {Click here for ABCD2, HEART and other calculators REFRESH Note before signing:1}                              Medical Decision Making Amount and/or Complexity of Data Reviewed Labs: ordered. Radiology: ordered.   ***  {Document critical care time when appropriate  Document review of labs and clinical decision tools ie [id number], etc  Document your independent review of radiology images and any outside records  Document your discussion with  family members, caretakers and with consultants  Document social determinants of health affecting pt's care  Document your decision making why or why not admission, treatments were needed:32947:::1}   Final diagnoses:  None    ED Discharge Orders     None

## 2024-06-26 NOTE — ED Notes (Signed)
 Dressed right elbow skin tears with bactroban , telfa, and 3 kling wrap.

## 2024-06-26 NOTE — Discharge Instructions (Addendum)
 You were seen in the ER today for evaluation after your fall. Please make sure that you are careful with walking. Your images do not show any acute changes, but it does show that you have some lung nodules. You will need to have follow up with these with your PCP. Please make sure that you are staying well hydrated!  Please make sure that you are remembering to keep your wound clean daily with Dial soap and water  and daily bandage changes. I recommend keeping the wound covered for the next 48-72 hours and then to your comfort afterwards. Do not expose the wound to any dishwater, pools, lakes, oceans, [employer], dirt or grime. Keeping the wound clean and away from contamination can help ensure good wound healing and help to prevent infections. Additionally your urine looks like it may be contaminated. I will start you on some antibiotics to take. Please follow up with your MyChart. If you have any concerns, new or worsening symptoms, please return to the ER for re-evaluation.   Contact a doctor if: These symptoms do not go away: Headaches. Dizziness. Double vision or vision changes. Trouble sleeping. Changes in mood. You have new symptoms. Get help right away if: You have sudden: Headache that is very bad. Vomiting that does not stop. Changes in the size of one of your pupils. Pupils are the black centers of your eyes. Changes in how you see (vision). More confusion or more grumpy moods. You have a seizure. Your symptoms get worse. You have a clear or bloody fluid coming from your nose or ears. These symptoms may be an emergency. Get help right away. Call 911. Do not wait to see if the symptoms will go away. Do not drive yourself to the hospital.

## 2024-06-26 NOTE — ED Notes (Signed)
 Called lab to add urine culture, spoke with Jasmine.

## 2024-06-28 LAB — URINE CULTURE: Culture: NO GROWTH

## 2024-07-01 ENCOUNTER — Other Ambulatory Visit: Payer: Self-pay

## 2024-07-01 ENCOUNTER — Telehealth: Payer: Self-pay

## 2024-07-01 ENCOUNTER — Emergency Department (HOSPITAL_BASED_OUTPATIENT_CLINIC_OR_DEPARTMENT_OTHER): Admitting: Radiology

## 2024-07-01 ENCOUNTER — Encounter (HOSPITAL_BASED_OUTPATIENT_CLINIC_OR_DEPARTMENT_OTHER): Payer: Self-pay

## 2024-07-01 ENCOUNTER — Ambulatory Visit: Payer: Self-pay

## 2024-07-01 DIAGNOSIS — M4802 Spinal stenosis, cervical region: Secondary | ICD-10-CM | POA: Diagnosis present

## 2024-07-01 DIAGNOSIS — M1811 Unilateral primary osteoarthritis of first carpometacarpal joint, right hand: Secondary | ICD-10-CM | POA: Diagnosis not present

## 2024-07-01 DIAGNOSIS — Z882 Allergy status to sulfonamides status: Secondary | ICD-10-CM | POA: Diagnosis not present

## 2024-07-01 DIAGNOSIS — Z888 Allergy status to other drugs, medicaments and biological substances status: Secondary | ICD-10-CM | POA: Diagnosis not present

## 2024-07-01 DIAGNOSIS — Z803 Family history of malignant neoplasm of breast: Secondary | ICD-10-CM

## 2024-07-01 DIAGNOSIS — S51011D Laceration without foreign body of right elbow, subsequent encounter: Secondary | ICD-10-CM

## 2024-07-01 DIAGNOSIS — Z8261 Family history of arthritis: Secondary | ICD-10-CM

## 2024-07-01 DIAGNOSIS — G4733 Obstructive sleep apnea (adult) (pediatric): Secondary | ICD-10-CM | POA: Diagnosis present

## 2024-07-01 DIAGNOSIS — S61411D Laceration without foreign body of right hand, subsequent encounter: Secondary | ICD-10-CM | POA: Diagnosis not present

## 2024-07-01 DIAGNOSIS — F32A Depression, unspecified: Secondary | ICD-10-CM | POA: Diagnosis present

## 2024-07-01 DIAGNOSIS — Z833 Family history of diabetes mellitus: Secondary | ICD-10-CM | POA: Diagnosis not present

## 2024-07-01 DIAGNOSIS — K219 Gastro-esophageal reflux disease without esophagitis: Secondary | ICD-10-CM | POA: Diagnosis present

## 2024-07-01 DIAGNOSIS — E785 Hyperlipidemia, unspecified: Secondary | ICD-10-CM | POA: Diagnosis present

## 2024-07-01 DIAGNOSIS — F0283 Dementia in other diseases classified elsewhere, unspecified severity, with mood disturbance: Secondary | ICD-10-CM | POA: Diagnosis present

## 2024-07-01 DIAGNOSIS — Z853 Personal history of malignant neoplasm of breast: Secondary | ICD-10-CM

## 2024-07-01 DIAGNOSIS — S6991XA Unspecified injury of right wrist, hand and finger(s), initial encounter: Secondary | ICD-10-CM | POA: Diagnosis not present

## 2024-07-01 DIAGNOSIS — I480 Paroxysmal atrial fibrillation: Secondary | ICD-10-CM | POA: Diagnosis present

## 2024-07-01 DIAGNOSIS — Z8042 Family history of malignant neoplasm of prostate: Secondary | ICD-10-CM

## 2024-07-01 DIAGNOSIS — Z881 Allergy status to other antibiotic agents status: Secondary | ICD-10-CM

## 2024-07-01 DIAGNOSIS — L089 Local infection of the skin and subcutaneous tissue, unspecified: Secondary | ICD-10-CM | POA: Diagnosis not present

## 2024-07-01 DIAGNOSIS — Z885 Allergy status to narcotic agent status: Secondary | ICD-10-CM | POA: Diagnosis not present

## 2024-07-01 DIAGNOSIS — Z800 Family history of malignant neoplasm of digestive organs: Secondary | ICD-10-CM | POA: Diagnosis not present

## 2024-07-01 DIAGNOSIS — Z880 Allergy status to penicillin: Secondary | ICD-10-CM

## 2024-07-01 DIAGNOSIS — G301 Alzheimer's disease with late onset: Secondary | ICD-10-CM | POA: Diagnosis present

## 2024-07-01 DIAGNOSIS — W19XXXD Unspecified fall, subsequent encounter: Secondary | ICD-10-CM | POA: Diagnosis present

## 2024-07-01 DIAGNOSIS — Z8249 Family history of ischemic heart disease and other diseases of the circulatory system: Secondary | ICD-10-CM | POA: Diagnosis not present

## 2024-07-01 DIAGNOSIS — F02A2 Dementia in other diseases classified elsewhere, mild, with psychotic disturbance: Secondary | ICD-10-CM | POA: Diagnosis not present

## 2024-07-01 DIAGNOSIS — L0390 Cellulitis, unspecified: Secondary | ICD-10-CM | POA: Diagnosis present

## 2024-07-01 DIAGNOSIS — L03113 Cellulitis of right upper limb: Principal | ICD-10-CM | POA: Diagnosis present

## 2024-07-01 DIAGNOSIS — Z79899 Other long term (current) drug therapy: Secondary | ICD-10-CM | POA: Diagnosis not present

## 2024-07-01 DIAGNOSIS — C50512 Malignant neoplasm of lower-outer quadrant of left female breast: Secondary | ICD-10-CM | POA: Diagnosis not present

## 2024-07-01 DIAGNOSIS — Z170 Estrogen receptor positive status [ER+]: Secondary | ICD-10-CM | POA: Diagnosis not present

## 2024-07-01 DIAGNOSIS — F3341 Major depressive disorder, recurrent, in partial remission: Secondary | ICD-10-CM | POA: Diagnosis not present

## 2024-07-01 NOTE — Telephone Encounter (Signed)
 FYI Only or Action Required?: FYI only for provider.  Patient was last seen in primary care on 01/23/2024 by Katrinka Garnette KIDD, MD.  Called Nurse Triage reporting Wound Infection.  Symptoms began several days ago.  Interventions attempted: Nothing.  Symptoms are: rapidly worsening.  Triage Disposition: See HCP Within 4 Hours (Or PCP Triage) - UC or ED  Patient/caregiver understands and will follow disposition?: No, refuses disposition Reason for Disposition  [1] Looks infected (spreading redness, red streak, pus) AND [2] fever  Answer Assessment - Initial Assessment Questions Patient called EMS today d/t confusion. EMS looked at her wound that was bandaged at the ED (skin tear) and told her that she needs to go back to the ED d/t concerns regarding infection as there is pus visible. This RN provided rationales for having her seen in the ED or UC tonight as the office is closed. Patient's daughter refused. Requested OV tomorrow, advised that there are no openings in the office. This RN again recommended she take her mother to an UC or the ED tonight. Unclear if she will comply with recommendation at this time.  1. SYMPTOM: What's the main symptom you're concerned about? (e.g., drainage, incision opened up, pain, redness)     Wound infection  2. ONSET:      06/26/24  3. SURGERY: What surgery did you have?     N/A  4. DATE of SURGERY: When was the surgery?      Was bandaged at ED on 9/6, nonsurgical  5. LOCATION: Where is the wound located?      Right elbow  6. WOUND APPEARANCE: What does the wound look like?      Open with white pus visible  Protocols used: Wound Infection-A-AH Copied from CRM #[id number]. Topic: Clinical - Red Word Triage >> Jul 01, 2024  4:49 PM Donee H wrote: Red Word that prompted transfer to Nurse Triage: Patient's daughter Harris calling with concern about that patient hands. She states she fell Sat.(06-26-2024) and was taken to Er. At the Er they  stitched  up wound and bandage it. Patient has taken it all off and now wound is exposed and open again. She states it may have an infection now. Would like to try to come see pcp tomorrow if possible

## 2024-07-01 NOTE — ED Triage Notes (Signed)
 Patient arrives POV with husband from home c/o right hand pain; patient states she was evaluated a few days ago in the ED for her right hand injury. Patient states EMS advised patient to go back to the ED for re-evaluation due to possible infection. Patient's right hand is currently wrapped in gauze.

## 2024-07-01 NOTE — Telephone Encounter (Signed)
 Transition Care Management Follow-up Telephone Call Date of discharge and from where: 06/26/2024 [facility] How have you been since you were released from the hospital? Better Any questions or concerns? No  Items Reviewed: Did the pt receive and understand the discharge instructions provided? Yes  Medications obtained and verified? Yes  Other? No  Any new allergies since your discharge? No  Dietary orders reviewed? No Do you have support at home? Yes   Home Care and Equipment/Supplies: Were home health services ordered? not applicable If so, what is the name of the agency?   Has the agency set up a time to come to the patient's home? not applicable Were any new equipment or medical supplies ordered?  No What is the name of the medical supply agency?  Were you able to get the supplies/equipment? not applicable Do you have any questions related to the use of the equipment or supplies? No  Functional Questionnaire: (I = Independent and D = Dependent) ADLs: I  Bathing/Dressing- I  Meal Prep- I  Eating- I  Maintaining continence- I  Transferring/Ambulation- I  Managing Meds- I Patient daughter states she takes care of patient with ADL and no help is needed at this time.  Follow up appointments reviewed:  PCP Hospital f/u appt confirmed? Calling office today to schedule  Specialist Hospital f/u appt confirmed? No   Are transportation arrangements needed? No  If their condition worsens, is the pt aware to call PCP or go to the Emergency Dept.? Yes Was the patient provided with contact information for the PCP's office or ED? Yes Was to pt encouraged to call back with questions or concerns? Yes

## 2024-07-02 ENCOUNTER — Inpatient Hospital Stay (HOSPITAL_BASED_OUTPATIENT_CLINIC_OR_DEPARTMENT_OTHER)
Admission: EM | Admit: 2024-07-02 | Discharge: 2024-07-03 | DRG: 603 | Disposition: A | Discharge status: Home / Self Care | Source: Ambulatory Visit | Attending: Family Medicine | Admitting: Family Medicine

## 2024-07-02 ENCOUNTER — Encounter (HOSPITAL_COMMUNITY): Payer: Self-pay | Admitting: Internal Medicine

## 2024-07-02 DIAGNOSIS — Z8261 Family history of arthritis: Secondary | ICD-10-CM | POA: Diagnosis not present

## 2024-07-02 DIAGNOSIS — F32A Depression, unspecified: Secondary | ICD-10-CM | POA: Diagnosis present

## 2024-07-02 DIAGNOSIS — Z8042 Family history of malignant neoplasm of prostate: Secondary | ICD-10-CM | POA: Diagnosis not present

## 2024-07-02 DIAGNOSIS — I480 Paroxysmal atrial fibrillation: Secondary | ICD-10-CM

## 2024-07-02 DIAGNOSIS — Z170 Estrogen receptor positive status [ER+]: Secondary | ICD-10-CM

## 2024-07-02 DIAGNOSIS — Z833 Family history of diabetes mellitus: Secondary | ICD-10-CM | POA: Diagnosis not present

## 2024-07-02 DIAGNOSIS — F3341 Major depressive disorder, recurrent, in partial remission: Secondary | ICD-10-CM

## 2024-07-02 DIAGNOSIS — S51011D Laceration without foreign body of right elbow, subsequent encounter: Secondary | ICD-10-CM | POA: Diagnosis not present

## 2024-07-02 DIAGNOSIS — L0390 Cellulitis, unspecified: Secondary | ICD-10-CM | POA: Diagnosis present

## 2024-07-02 DIAGNOSIS — E785 Hyperlipidemia, unspecified: Secondary | ICD-10-CM

## 2024-07-02 DIAGNOSIS — L03113 Cellulitis of right upper limb: Secondary | ICD-10-CM

## 2024-07-02 DIAGNOSIS — C50512 Malignant neoplasm of lower-outer quadrant of left female breast: Secondary | ICD-10-CM | POA: Diagnosis not present

## 2024-07-02 DIAGNOSIS — Z888 Allergy status to other drugs, medicaments and biological substances status: Secondary | ICD-10-CM | POA: Diagnosis not present

## 2024-07-02 DIAGNOSIS — I4891 Unspecified atrial fibrillation: Secondary | ICD-10-CM | POA: Diagnosis present

## 2024-07-02 DIAGNOSIS — G4733 Obstructive sleep apnea (adult) (pediatric): Secondary | ICD-10-CM

## 2024-07-02 DIAGNOSIS — G301 Alzheimer's disease with late onset: Secondary | ICD-10-CM | POA: Diagnosis present

## 2024-07-02 DIAGNOSIS — Z79899 Other long term (current) drug therapy: Secondary | ICD-10-CM | POA: Diagnosis not present

## 2024-07-02 DIAGNOSIS — Z853 Personal history of malignant neoplasm of breast: Secondary | ICD-10-CM | POA: Diagnosis not present

## 2024-07-02 DIAGNOSIS — Z885 Allergy status to narcotic agent status: Secondary | ICD-10-CM | POA: Diagnosis not present

## 2024-07-02 DIAGNOSIS — W19XXXD Unspecified fall, subsequent encounter: Secondary | ICD-10-CM | POA: Diagnosis present

## 2024-07-02 DIAGNOSIS — Z882 Allergy status to sulfonamides status: Secondary | ICD-10-CM | POA: Diagnosis not present

## 2024-07-02 DIAGNOSIS — M4802 Spinal stenosis, cervical region: Secondary | ICD-10-CM | POA: Diagnosis present

## 2024-07-02 DIAGNOSIS — Z881 Allergy status to other antibiotic agents status: Secondary | ICD-10-CM | POA: Diagnosis not present

## 2024-07-02 DIAGNOSIS — L089 Local infection of the skin and subcutaneous tissue, unspecified: Principal | ICD-10-CM

## 2024-07-02 DIAGNOSIS — Z800 Family history of malignant neoplasm of digestive organs: Secondary | ICD-10-CM | POA: Diagnosis not present

## 2024-07-02 DIAGNOSIS — Z803 Family history of malignant neoplasm of breast: Secondary | ICD-10-CM | POA: Diagnosis not present

## 2024-07-02 DIAGNOSIS — K219 Gastro-esophageal reflux disease without esophagitis: Secondary | ICD-10-CM | POA: Diagnosis not present

## 2024-07-02 DIAGNOSIS — F0283 Dementia in other diseases classified elsewhere, unspecified severity, with mood disturbance: Secondary | ICD-10-CM | POA: Diagnosis present

## 2024-07-02 DIAGNOSIS — S61411D Laceration without foreign body of right hand, subsequent encounter: Secondary | ICD-10-CM | POA: Diagnosis not present

## 2024-07-02 DIAGNOSIS — Z880 Allergy status to penicillin: Secondary | ICD-10-CM | POA: Diagnosis not present

## 2024-07-02 DIAGNOSIS — Z8249 Family history of ischemic heart disease and other diseases of the circulatory system: Secondary | ICD-10-CM | POA: Diagnosis not present

## 2024-07-02 DIAGNOSIS — F02A2 Dementia in other diseases classified elsewhere, mild, with psychotic disturbance: Secondary | ICD-10-CM

## 2024-07-02 LAB — CBC WITH DIFFERENTIAL/PLATELET
Abs Immature Granulocytes: 0.02 K/uL (ref 0.00–0.07)
Basophils Absolute: 0.1 K/uL (ref 0.0–0.1)
Basophils Relative: 1 %
Eosinophils Absolute: 0.2 K/uL (ref 0.0–0.5)
Eosinophils Relative: 3 %
HCT: 34.9 % — ABNORMAL LOW (ref 36.0–46.0)
Hemoglobin: 11.2 g/dL — ABNORMAL LOW (ref 12.0–15.0)
Immature Granulocytes: 0 %
Lymphocytes Relative: 29 %
Lymphs Abs: 2.3 K/uL (ref 0.7–4.0)
MCH: 28.5 pg (ref 26.0–34.0)
MCHC: 32.1 g/dL (ref 30.0–36.0)
MCV: 88.8 fL (ref 80.0–100.0)
Monocytes Absolute: 0.7 K/uL (ref 0.1–1.0)
Monocytes Relative: 9 %
Neutro Abs: 4.5 K/uL (ref 1.7–7.7)
Neutrophils Relative %: 58 %
Platelets: 307 K/uL (ref 150–400)
RBC: 3.93 MIL/uL (ref 3.87–5.11)
RDW: 14.4 % (ref 11.5–15.5)
WBC: 7.8 K/uL (ref 4.0–10.5)
nRBC: 0 % (ref 0.0–0.2)

## 2024-07-02 LAB — URINALYSIS, ROUTINE W REFLEX MICROSCOPIC
Bilirubin Urine: NEGATIVE
Glucose, UA: NEGATIVE mg/dL
Hgb urine dipstick: NEGATIVE
Ketones, ur: NEGATIVE mg/dL
Leukocytes,Ua: NEGATIVE
Nitrite: NEGATIVE
Protein, ur: NEGATIVE mg/dL
Specific Gravity, Urine: 1.012 (ref 1.005–1.030)
pH: 6 (ref 5.0–8.0)

## 2024-07-02 LAB — COMPREHENSIVE METABOLIC PANEL WITH GFR
ALT: 16 U/L (ref 0–44)
AST: 29 U/L (ref 15–41)
Albumin: 4.1 g/dL (ref 3.5–5.0)
Alkaline Phosphatase: 79 U/L (ref 38–126)
Anion gap: 14 (ref 5–15)
BUN: 21 mg/dL (ref 8–23)
CO2: 21 mmol/L — ABNORMAL LOW (ref 22–32)
Calcium: 9.9 mg/dL (ref 8.9–10.3)
Chloride: 106 mmol/L (ref 98–111)
Creatinine, Ser: 1.04 mg/dL — ABNORMAL HIGH (ref 0.44–1.00)
GFR, Estimated: 51 mL/min — ABNORMAL LOW (ref >60)
Glucose, Bld: 104 mg/dL — ABNORMAL HIGH (ref 70–99)
Potassium: 4 mmol/L (ref 3.5–5.1)
Sodium: 141 mmol/L (ref 135–145)
Total Bilirubin: 0.4 mg/dL (ref 0.0–1.2)
Total Protein: 7 g/dL (ref 6.5–8.1)

## 2024-07-02 LAB — LIPASE, BLOOD: Lipase: 34 U/L (ref 11–51)

## 2024-07-02 LAB — LACTIC ACID, PLASMA: Lactic Acid, Venous: 0.7 mmol/L (ref 0.5–1.9)

## 2024-07-02 LAB — MRSA NEXT GEN BY PCR, NASAL: MRSA by PCR Next Gen: NOT DETECTED

## 2024-07-02 MED ORDER — LINEZOLID 600 MG/300ML IV SOLN
600.0000 mg | Freq: Two times a day (BID) | INTRAVENOUS | Status: DC
Start: 2024-07-03 — End: 2024-07-09
  Administered 2024-07-03: 600 mg via INTRAVENOUS
  Filled 2024-07-02 (×2): qty 300

## 2024-07-02 MED ORDER — POLYVINYL ALCOHOL 1.4 % OP SOLN: 1.0000 [drp] | Freq: Every day | OPHTHALMIC | Status: DC | PRN

## 2024-07-02 MED ORDER — TRAZODONE HCL 50 MG PO TABS
50.0000 mg | ORAL_TABLET | Freq: Every evening | ORAL | Status: DC | PRN
  Administered 2024-07-02: 50 mg via ORAL
  Filled 2024-07-02: qty 1

## 2024-07-02 MED ORDER — PANTOPRAZOLE SODIUM 40 MG PO TBEC
40.0000 mg | DELAYED_RELEASE_TABLET | Freq: Every day | ORAL | Status: DC
  Administered 2024-07-02 – 2024-07-03 (×2): 40 mg via ORAL
  Filled 2024-07-02 (×2): qty 1

## 2024-07-02 MED ORDER — ACETAMINOPHEN 650 MG RE SUPP: 650.0000 mg | Freq: Four times a day (QID) | RECTAL | Status: DC | PRN

## 2024-07-02 MED ORDER — ROSUVASTATIN CALCIUM 5 MG PO TABS
5.0000 mg | ORAL_TABLET | Freq: Every evening | ORAL | Status: DC
  Administered 2024-07-02: 5 mg via ORAL
  Filled 2024-07-02: qty 1

## 2024-07-02 MED ORDER — FLECAINIDE ACETATE 100 MG PO TABS
100.0000 mg | ORAL_TABLET | Freq: Two times a day (BID) | ORAL | Status: DC
  Administered 2024-07-02 – 2024-07-03 (×3): 100 mg via ORAL
  Filled 2024-07-02 (×4): qty 1

## 2024-07-02 MED ORDER — ACETAMINOPHEN 325 MG PO TABS: 650.0000 mg | ORAL_TABLET | Freq: Four times a day (QID) | ORAL | Status: DC | PRN

## 2024-07-02 MED ORDER — SODIUM CHLORIDE 0.9 % IV SOLN
2.0000 g | Freq: Once | INTRAVENOUS | Status: AC
  Administered 2024-07-02: 2 g via INTRAVENOUS
  Filled 2024-07-02: qty 20

## 2024-07-02 MED ORDER — ENOXAPARIN SODIUM 40 MG/0.4ML IJ SOSY
40.0000 mg | PREFILLED_SYRINGE | INTRAMUSCULAR | Status: DC
  Administered 2024-07-02: 40 mg via SUBCUTANEOUS
  Filled 2024-07-02: qty 0.4

## 2024-07-02 MED ORDER — POLYETHYLENE GLYCOL 3350 17 G PO PACK: 17.0000 g | PACK | Freq: Every day | ORAL | Status: DC | PRN

## 2024-07-02 MED ORDER — RIVASTIGMINE TARTRATE 1.5 MG PO CAPS
3.0000 mg | ORAL_CAPSULE | Freq: Two times a day (BID) | ORAL | Status: DC
  Administered 2024-07-02 – 2024-07-03 (×3): 3 mg via ORAL
  Filled 2024-07-02 (×4): qty 2

## 2024-07-02 MED ORDER — SODIUM CHLORIDE 0.9% FLUSH
3.0000 mL | Freq: Two times a day (BID) | INTRAVENOUS | Status: DC
  Administered 2024-07-02 – 2024-07-03 (×3): 3 mL via INTRAVENOUS

## 2024-07-02 MED ORDER — VANCOMYCIN HCL IN DEXTROSE 1-5 GM/200ML-% IV SOLN
1000.0000 mg | Freq: Once | INTRAVENOUS | Status: AC
  Administered 2024-07-02: 1000 mg via INTRAVENOUS
  Filled 2024-07-02: qty 200

## 2024-07-02 MED ORDER — DULOXETINE HCL 60 MG PO CPEP
60.0000 mg | ORAL_CAPSULE | Freq: Every day | ORAL | Status: DC
Start: 2024-07-02 — End: 2024-07-03
  Administered 2024-07-02 – 2024-07-03 (×2): 60 mg via ORAL
  Filled 2024-07-02: qty 1

## 2024-07-02 MED ORDER — METOPROLOL SUCCINATE ER 25 MG PO TB24
12.5000 mg | ORAL_TABLET | Freq: Two times a day (BID) | ORAL | Status: DC
  Administered 2024-07-02 – 2024-07-03 (×3): 12.5 mg via ORAL
  Filled 2024-07-02 (×3): qty 1

## 2024-07-02 NOTE — H&P (Signed)
 History and Physical   Bridget Lane FMW:[id number] DOB: 1932/11/17 DOA: 07/02/2024  PCP: Katrinka Garnette KIDD, MD   Patient coming from: Facility  Chief Complaint: Hand pain  HPI: Bridget Lane is a 89 y.o. female with medical history significant of hyperlipidemia, GERD, A-fib, Alzheimer's dementia, depression, OSA, breast cancer, spinal stenosis presenting with hand pain.  Patient initially had a fall on September 6 and was seen in the ED for evaluation following this.  She had a skin tear to her right elbow and a tear to her right hand as well.  Steri-Strips were placed on the right hand after it was irrigated.  Patient was discharged.   She reports that the Steri-Strips fell off the same day.  In the past several days she has had subsequent increased pain, redness, swelling, drainage from her right hand wound.  There has been concern for cellulitis at facility and she reportedly started cefpodoxime  on 9/8 but has not had significant improvement.  Present to the ED for further evaluation/IV antibiotics.  Denies fevers, chills, chest pain, shortness of breath, abdominal pain, constipation, diarrhea, nausea, vomiting.  ED Course: Vital signs in the ED notable for blood pressure in the 130s-150s systolic, heart rate in the 50s-90s.  Lab workup reviewed CMP with bicarb 21, glucose 24.  CBC with hemoglobin stable 11.2.  Lactic acid normal.  Lipase normal.  Urinalysis normal.  Blood cultures pending.  Right hand x-ray showed no bony abnormality but did show soft tissue swelling at the distal metatarsal on the dorsal surface.  Patient received vancomycin  and ceftriaxone  in the ED.  Review of Systems: As per HPI otherwise all other systems reviewed and are negative.  Past Medical History:  Diagnosis Date   AKI (acute kidney injury) (HCC) 08/15/2021   Atrial fibrillation (HCC)    persistent   Colon polyp 2005   Dementia (HCC)    Depression    DJD (degenerative joint disease)     Dysrhythmia    Esophageal stricture    Family history of breast cancer    Family history of esophageal cancer    Family history of prostate cancer    Gait abnormality 07/07/2020   GERD (gastroesophageal reflux disease)    H. pylori infection    Hiatal hernia    Hyperlipidemia    Lumbar spinal stenosis 07/07/2020   L4-5   Macular degeneration    Nontoxic multinodular goiter    OA (osteoarthritis)    Postherpetic neuralgia at T3-T5 level 04/27/2011   Rectal bleeding 07/06/2014   Hemorrhoid related in past.      Past Surgical History:  Procedure Laterality Date   ANTERIOR CERVICAL DECOMP/DISCECTOMY FUSION N/A 05/29/2015   Procedure: ANTERIOR CERVICAL DECOMPRESSION/DISCECTOMY FUSION CERVICAL FOUR-FIVE,CERVICAL FIVE-SIX;  Surgeon: Arley Helling, MD;  Location: [facility];  Service: Neurosurgery;  Laterality: N/A;   APPENDECTOMY     BREAST LUMPECTOMY Left 06/2018   BREAST LUMPECTOMY WITH RADIOACTIVE SEED LOCALIZATION Left 06/25/2018   Procedure: BREAST LUMPECTOMY WITH RADIOACTIVE SEED LOCALIZATION;  Surgeon: Vanderbilt Ned, MD;  Location: [facility];  Service: General;  Laterality: Left;   CARDIAC CATHETERIZATION     CARDIOVERSION  08/29/2011   Procedure: CARDIOVERSION;  Surgeon: Ned Como, MD;  Location: [facility] OR;  Service: Cardiovascular;  Laterality: N/A;   CARDIOVERSION  11/22/2011   Procedure: CARDIOVERSION;  Surgeon: Lynwood JONETTA Rakers, MD;  Location: [facility] OR;  Service: Cardiovascular;  Laterality: N/A;   CHOLECYSTECTOMY  1989   COLONOSCOPY W/ POLYPECTOMY  2005  Neg in 2010; Dr Obie   ESOPHAGEAL DILATION     > 3 X; Dr Obie   IR EMBO ARTERIAL NOT HEMORR HEMANG INC GUIDE ROADMAPPING  01/02/2024   IR RADIOLOGIST EVAL & MGMT  12/12/2023   IR RADIOLOGIST EVAL & MGMT  02/09/2024   LEFT AND RIGHT HEART CATHETERIZATION WITH CORONARY ANGIOGRAM N/A 12/02/2011   Procedure: LEFT AND RIGHT HEART CATHETERIZATION WITH CORONARY ANGIOGRAM;  Surgeon: Toribio JONELLE Fuel, MD;  Location: [facility]  CATH LAB;  Service: Cardiovascular;  Laterality: N/A;   TOTAL ABDOMINAL HYSTERECTOMY  1972   for pain (no BSO)   TUBAL LIGATION     with appendectomy   UPPER GI ENDOSCOPY  2010   H pylori    Social History  reports that she has never smoked. She has never used smokeless tobacco. She reports that she does not drink alcohol  and does not use drugs.  Allergies  Allergen Reactions   Penicillins Rash   Codeine Other (See Comments)    hallucinations hallucinations   Codeine Other (See Comments)    nightmares   Dabigatran  Etexilate Mesylate     Unk reaction   Dilaudid  [Hydromorphone ]     Rash with this in December 2022 hospitalization. hallucinations   Dabigatran  Etexilate Mesylate Other (See Comments)     All extremities feel heavy and hurt   Dabigatran  Etexilate Mesylate Other (See Comments)     All extremities feel heavy and hurt   Penicillins Rash   Sulfa Antibiotics Rash   Sulfonamide Derivatives Rash    Family History  Problem Relation Age of Onset   CAD Other    Heart failure Mother    Coronary artery disease Mother    Diabetes Mother    Osteoarthritis Father    Coronary artery disease Father    Prostate cancer Father        in 20s   Breast cancer Sister        in 61's   Diabetes Sister    Esophageal cancer Brother        smoked   Breast cancer Maternal Aunt   Reviewed on admission  Prior to Admission medications   Medication Sig Start Date End Date Taking? Authorizing Provider  busPIRone  (BUSPAR ) 5 MG tablet TAKE ONE (1) TABLET BY MOUTH TWICE DAILY AS NEEDED FOR ANXIETY Patient taking differently: Take 5 mg by mouth 2 (two) times daily. 05/03/24  Yes Katrinka Garnette KIDD, MD  DULoxetine  (CYMBALTA ) 30 MG capsule Take 2 capsules (60 mg total) by mouth daily. 08/05/23  Yes Katrinka Garnette KIDD, MD  flecainide  (TAMBOCOR ) 100 MG tablet TAKE ONE TABLET BY MOUTH TWICE DAILY 11/18/23  Yes Katrinka Garnette KIDD, MD  metoprolol  succinate (TOPROL -XL) 25 MG 24 hr tablet TAKE 1/2  TABLET BY MOUTH TWICE DAILY 12/04/23  Yes Katrinka Garnette KIDD, MD  Multiple Vitamins-Minerals (MULTI FOR HER 50+) TABS Take 1 tablet by mouth daily.   Yes [provider]  Multiple Vitamins-Minerals (PRESERVISION AREDS 2+MULTI VIT PO) Take 1 tablet by mouth in the morning and at bedtime.   Yes [provider]  omeprazole  (PRILOSEC) 40 MG capsule TAKE ONE (1) CAPSULE BY MOUTH TWICE DAILY Patient taking differently: Take 40 mg by mouth daily. TAKE ONE (1) CAPSULE BY MOUTH TWICE DAILY 11/05/23  Yes Katrinka Garnette KIDD, MD  Polyethyl Glycol-Propyl Glycol 0.4-0.3 % SOLN Apply 1-2 drops to eye daily as needed (stye, dryness).   Yes [provider]  rivastigmine  (EXELON ) 3 MG capsule Take 1 capsule (3  mg total) by mouth 2 (two) times daily. 05/05/24 04/30/25 Yes Gregg Lek, MD  rosuvastatin  (CRESTOR ) 5 MG tablet TAKE ONE TABLET BY MOUTH IN THE EVENING 10/03/23  Yes Katrinka Garnette KIDD, MD  cefpodoxime  (VANTIN ) 200 MG tablet Take 1 tablet (200 mg total) by mouth 2 (two) times daily for 7 days. Patient not taking: Reported on 07/02/2024 06/26/24 07/03/24  Bernis Ernst, PA-C    Physical Exam: Vitals:   07/02/24 0900 07/02/24 0945 07/02/24 1031 07/02/24 1118  BP: 121/61 (!) 133/57 (!) 129/111 (!) 154/79  Pulse: 98 (!) 50  65  Resp:   16 17  Temp:   98.2 F (36.8 C) 98.8 F (37.1 C)  TempSrc:   Oral Oral  SpO2:  95% 96% 96%  Weight:      Height:        Physical Exam Constitutional:      General: She is not in acute distress.    Appearance: Normal appearance.  HENT:     Head: Normocephalic and atraumatic.     Mouth/Throat:     Mouth: Mucous membranes are moist.     Pharynx: Oropharynx is clear.  Eyes:     Extraocular Movements: Extraocular movements intact.     Pupils: Pupils are equal, round, and reactive to light.  Cardiovascular:     Rate and Rhythm: Normal rate and regular rhythm.     Pulses: Normal pulses.     Heart sounds: Normal heart sounds.  Pulmonary:      Effort: Pulmonary effort is normal. No respiratory distress.     Breath sounds: Normal breath sounds.  Abdominal:     General: Bowel sounds are normal. There is no distension.     Palpations: Abdomen is soft.     Tenderness: There is no abdominal tenderness.  Musculoskeletal:        General: No swelling or deformity.     Comments: Skin tear to right elbow and right hand.  Wound of right hand bandaged.  Skin:    General: Skin is warm and dry.  Neurological:     General: No focal deficit present.     Mental Status: Mental status is at baseline.        Labs on Admission: I have personally reviewed following labs and imaging studies  CBC: Recent Labs  Lab 06/26/24 1550 07/02/24 0508  WBC 8.2 7.8  NEUTROABS 5.0 4.5  HGB 11.0* 11.2*  HCT 33.7* 34.9*  MCV 88.9 88.8  PLT 298 307    Basic Metabolic Panel: Recent Labs  Lab 06/26/24 1550 07/02/24 0508  NA 143 141  K 3.7 4.0  CL 109 106  CO2 18* 21*  GLUCOSE 94 104*  BUN 24* 21  CREATININE 1.27* 1.04*  CALCIUM  9.9 9.9    GFR: Estimated Creatinine Clearance: 34.7 mL/min (A) (by C-G formula based on SCr of 1.04 mg/dL (H)).  Liver Function Tests: Recent Labs  Lab 06/26/24 1550 07/02/24 0508  AST 28 29  ALT 16 16  ALKPHOS 85 79  BILITOT 0.6 0.4  PROT 6.9 7.0  ALBUMIN 4.2 4.1    Urine analysis:    Component Value Date/Time   COLORURINE YELLOW 07/02/2024 0508   APPEARANCEUR CLEAR 07/02/2024 0508   LABSPEC 1.012 07/02/2024 0508   PHURINE 6.0 07/02/2024 0508   GLUCOSEU NEGATIVE 07/02/2024 0508   HGBUR NEGATIVE 07/02/2024 0508   BILIRUBINUR NEGATIVE 07/02/2024 0508   BILIRUBINUR Negative 06/18/2019 1412   KETONESUR NEGATIVE 07/02/2024 0508   PROTEINUR NEGATIVE 07/02/2024  9491   UROBILINOGEN 0.2 06/18/2019 1412   NITRITE NEGATIVE 07/02/2024 0508   LEUKOCYTESUR NEGATIVE 07/02/2024 0508    Radiological Exams on Admission: DG Hand Complete Right Result Date: 07/01/2024 CLINICAL DATA:  Wound, injury. EXAM:  RIGHT HAND - COMPLETE 3+ VIEW COMPARISON:  None Available. FINDINGS: There is soft tissue swelling over the dorsal aspect of the distal metatarsals. There is no acute fracture or dislocation identified. There is diffuse interphalangeal joint space narrowing and osteophyte formation compatible with degenerative change. This is severe in the second, third, fourth and fifth distal interphalangeal joints. There are moderate degenerative changes of the first carpometacarpal joint with joint space narrowing, sclerosis and subchondral cystic change. IMPRESSION: 1. No acute fracture or dislocation. 2. Soft tissue swelling over the dorsal aspect of the distal metatarsals. 3. Degenerative changes as described above. Electronically Signed   By: Greig Pique M.D.   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 > Has not responded to 3 days of outpatient p.o. antibiotics.  Started on IV antibiotics in the ED.  Due to drainage patient has been started on MRSA coverage. > Fortunately no significant leukocytosis or fever at this time but will continue with MRSA coverage. - Patient accepted to telemetry, will monitor there for now - Will switch to linezolid  for now on recommendation of pharmacy  - Check MRSA screening - Trend fever curve and WBC - Supportive care  Hyperlipidemia - Continue home rosuvastatin   GERD -  Continue PPI  Atrial fibrillation - Continue home flecainide , metoprolol   Mild Altheimer dementia - Continue home Exelon   Depression - Continue home duloxetine  - Holding BuSpar  as she only takes this as needed  History of breast cancer - S/p lumpectomy and hormonal treatment - Noted  OSA - Not on CPAP  DVT prophylaxis: Lovenox  Code Status:   Full  Family Communication:  None on admission  Disposition Plan:   Patient is from:  Home (sound like she stays at a normal apartment versus independent living.)  Anticipated DC to:  Same as above  Anticipated DC date:  2 to 4 days  Anticipated DC barriers: None  Consults called:  None Admission status:  Accepted previously as inpatient, telemetry  Severity of Illness: The appropriate patient status for this patient is INPATIENT. Inpatient status is judged to be reasonable and necessary in order to provide the required intensity of service to ensure the patient's safety. The patient's presenting symptoms, physical exam findings, and initial radiographic and laboratory data in the context of their chronic comorbidities is felt to place them at high risk for further clinical deterioration. Furthermore, it is not anticipated that the patient will be medically stable for discharge from the hospital within 2 midnights of admission.   * I certify that at the point of admission it is my clinical judgment that the patient will require inpatient hospital care spanning beyond 2 midnights from the point of admission  due to high intensity of service, high risk for further deterioration and high frequency of surveillance required.DEWAINE Marsa KATHEE Seena MD [facility]  How to contact the [facility] Attending or Consulting provider 7A - 7P or covering provider during after hours 7P -7A, for this patient?   Check the care team in [facility] and look for a) attending/consulting [facility] provider listed and b) the [facility] team listed Log into [email or web address] and use [facility]'s universal password to access. If you do not have the password, please contact the hospital operator. Locate the [facility] provider you are looking for under [facility] and page to a number that you can be directly reached. If you still have difficulty reaching the provider, please page the [facility] (Director on Call) for the Hospitalists listed on amion for assistance.  07/02/2024, 12:32 PM

## 2024-07-02 NOTE — ED Notes (Signed)
 ED Provider at bedside.

## 2024-07-02 NOTE — Plan of Care (Addendum)
 [facility] emergency department Jolynn Pack or Darryle Law telemetry bed transfer:  89 year old female past medical history of proximal atrial fibrillation, essential hypertension present emergency department complaining of right-sided hand pain which has been ongoing for few days after patient has a right hand injury.  Patient is coming from facility has a fall on September 6.  Has a skin tear of the elbow.Skin Steri-Strips were placed on her hand but she states these follow-up with them in several hours. She has had increased pain, redness and drainage to her right dorsal hand for the past several days. Was seen by somebody from EMS who told her there was concern for infection and she came to the hospital. Denies fever or feeling ill. She was prescribed cefpodoxime  which she has been taking for the past 3 days after starting it on the eighth.   At presentation to ED patient is hemodynamically stable.  CBC showing stable H&H normal WBC platelet count.  CMP unremarkable renal function at baseline.  Normal lactic acid level.  Blood cultures are in process.  X-ray of the right hand no evidence of fracture or dislocation.  Soft tissue swelling of the dorsal aspect of distal metatarsals.  In the ED patient received ceftriaxone  and vancomycin .  Dr. Reida reported that patient has small tear of the  dorsum of the right hand and elbow.  Hospitalist has been consulted for further management of right hand cellulitis.  Abdelrahman Nair, MD [facility] 07/02/2024, 7:00 AM

## 2024-07-02 NOTE — ED Notes (Signed)
 Called Karen at [facility] for transport

## 2024-07-02 NOTE — Telephone Encounter (Signed)
 Please see note and advise

## 2024-07-02 NOTE — Telephone Encounter (Signed)
 Unfortunately this has been my first chance to check in basket messages today with full day of seeing patients-hope she gets better soon and I know she will get good care there

## 2024-07-02 NOTE — Telephone Encounter (Signed)
 Update: Pt went to ED

## 2024-07-02 NOTE — Evaluation (Signed)
 Physical Therapy Evaluation Patient Details Name: Bridget Lane MRN: [id number] DOB: 1933-07-17 Today's Date: 07/02/2024  History of Present Illness  89 y.o. female had fall 9/6 with skin tear to R dorsal hand and elbow which was closed with steri strips. Was told by someone from EMS that there was concern for infection. Presented to ED 9/11 with increased R hand and finger pain with motion with increased redness and drainage from R dorsum. X-rays clear. Was transferred from [facility] and admitted for IV antibiotics. PMH: mild dementia, history of falls chronic right knee pain, A-fib not on blood thinners, GERD  Clinical Impression  Pt reports that she lives alone in an apartment with level entry. Pt reports she walks with Rollator and that she is independent with her ADLs, son manages her money and her medications arrive in the mail pre packaged. Pt also reports that she walks her rollator to to grocery store every 2 weeks. Pt oriented x 4 but has poor safety awareness. After session, PT called daughter Bridget Lane who confirms that pt lives alone and that the daughter, her brothers and pt's husband (who pt kicked out of the apartment) try to take care of the patient but that she will not accept help and is belligerent with them when they do. Pt is mod I for transfers and contact guard for ambulation in hallway with RW. Pt would benefit from placement in Memory Care facility. Recommended daughter speak with Social Worker about possible resources available. Pt would benefit from HHPT either at home or at Memory Care to work on balance training. PT will continue to follow acutely.       If plan is discharge home, recommend the following: A little help with walking and/or transfers;Direct supervision/assist for medications management;Supervision due to cognitive status;Direct supervision/assist for financial management;A little help with bathing/dressing/bathroom   Can travel by private vehicle    Yes     Equipment Recommendations None recommended by PT  Recommendations for Other Services   Assistance from LCSW on family resources, and Memory Care.    Functional Status Assessment Patient has had a recent decline in their functional status and demonstrates the ability to make significant improvements in function in a reasonable and predictable amount of time.     Precautions / Restrictions Precautions Precautions: Fall Recall of Precautions/Restrictions: Impaired Precaution/Restrictions Comments: frequent falls Restrictions Weight Bearing Restrictions Per Provider Order: No      Mobility  Bed Mobility               General bed mobility comments: sitting EoB on entry    Transfers Overall transfer level: Needs assistance   Transfers: Sit to/from Stand Sit to Stand: Modified independent (Device/Increase time)           General transfer comment: good power up and self steady at RW    Ambulation/Gait Ambulation/Gait assistance: Contact guard assist Gait Distance (Feet): 300 Feet Assistive device: Rolling walker (2 wheels) Gait Pattern/deviations: Step-through pattern, Decreased step length - right, Decreased step length - left Gait velocity: WFL Gait velocity interpretation: 1.31 - 2.62 ft/sec, indicative of limited community ambulator   General Gait Details: contact guard for safety for ambulation with short steps, vc for proximity to RW but pt normal is Rollator, pt able to self correct, mild instability but no overt LoB        Balance Overall balance assessment: Needs assistance Sitting-balance support: Feet supported, No upper extremity supported Sitting balance-Leahy Scale: Good     Standing balance  support: No upper extremity supported, During functional activity Standing balance-Leahy Scale: Fair                               Pertinent Vitals/Pain Pain Assessment Pain Assessment: No/denies pain    Home Living Family/patient expects  to be discharged to:: Private residence Living Arrangements: Alone Available Help at Discharge: Family;Available 24 hours/day (daughter, 2 sons and husband, all try to help out but she refuses help, will no longer let her husband live in the apartment.) Type of Home: Apartment Home Access: Level entry       Home Layout: One level Home Equipment: Grab bars - toilet;Grab bars - tub/shower;Shower seat;Cane - single point;Rollator (4 wheels)      Prior Function Prior Level of Function : Needs assist             Mobility Comments: reports Rollator ADLs Comments: son does bills and medications come in the mail sorted by day and time     Extremity/Trunk Assessment   Upper Extremity Assessment Upper Extremity Assessment: Defer to OT evaluation    Lower Extremity Assessment Lower Extremity Assessment: Overall WFL for tasks assessed    Cervical / Trunk Assessment Cervical / Trunk Assessment: Kyphotic  Communication   Communication Communication: No apparent difficulties    Cognition Arousal: Alert Behavior During Therapy: WFL for tasks assessed/performed   PT - Cognitive impairments: History of cognitive impairments, Memory, Awareness, Attention, Problem solving, Safety/Judgement                       PT - Cognition Comments: oriented x4, poor memory and safety awareness Following commands: Intact       Cueing Cueing Techniques: Verbal cues, Tactile cues, Visual cues     General Comments General comments (skin integrity, edema, etc.): Pt R hand bandaged, HR in 80s with ambulation, VSS on RA        Assessment/Plan    PT Assessment Patient needs continued PT services  PT Problem List Decreased balance;Decreased cognition;Decreased safety awareness;Decreased skin integrity       PT Treatment Interventions DME instruction;Gait training    PT Goals (Current goals can be found in the Care Plan section)  Acute Rehab PT Goals PT Goal Formulation: With  patient Time For Goal Achievement: 07/16/24 Potential to Achieve Goals: Fair    Frequency Min 3X/week        AM-PAC PT 6 Clicks Mobility  Outcome Measure Help needed turning from your back to your side while in a flat bed without using bedrails?: None Help needed moving from lying on your back to sitting on the side of a flat bed without using bedrails?: None Help needed moving to and from a bed to a chair (including a wheelchair)?: None Help needed standing up from a chair using your arms (e.g., wheelchair or bedside chair)?: A Little Help needed to walk in hospital room?: A Little Help needed climbing 3-5 steps with a railing? : A Little 6 Click Score: 21    End of Session Equipment Utilized During Treatment: Back brace Activity Tolerance: Patient tolerated treatment well Patient left: with call bell/phone within reach;with bed alarm set;Other (comment) (sitting EoB) Nurse Communication: Mobility status PT Visit Diagnosis: History of falling (Z91.81);Repeated falls (R29.6)    Time: 8484-8462 PT Time Calculation (min) (ACUTE ONLY): 22 min   Charges:   PT Evaluation $PT Eval Moderate Complexity: 1 Mod   PT  General Charges $$ ACUTE PT VISIT: 1 Visit         Bridget Lane PT, DPT Acute Rehabilitation Services Please use secure chat or  Call Office [phone]   Almarie KATHEE Fleeta [facility] 07/02/2024, 4:17 PM

## 2024-07-02 NOTE — ED Provider Notes (Signed)
 [place] EMERGENCY DEPARTMENT AT [facility] Provider Note   CSN: [id number] Arrival date & time: 07/01/24  2058     Patient presents with: Hand Pain   Bridget Lane is a 89 y.o. female.   Patient sustained a fall on September 6 and was seen at this facility.  She had a skin tear to her right dorsal hand and elbow.  Skin Steri-Strips were placed on her hand but she states these  follow-up with them in several hours.  She has had increased pain, redness and drainage to her right dorsal hand for the past several days.  Was seen by somebody from EMS who told her there was concern for infection and she came to the hospital.  Denies fever or feeling ill.  She was prescribed cefpodoxime  which she has been taking for the past 3 days after starting it on the eighth.  She denies any chest pain, shortness of breath, abdominal pain, nausea or vomiting.  Denies any new injury.  Has pain with range of motion of her fingers and increased redness and drainage to her right dorsal hand  The history is provided by the patient and the spouse.  Hand Pain Pertinent negatives include no chest pain, no abdominal pain, no headaches and no shortness of breath.       Prior to Admission medications   Medication Sig Start Date End Date Taking? Authorizing Provider  Brexpiprazole  (REXULTI ) 0.5 MG TABS Take 1 tablet (0.5 mg total) by mouth daily. Patient not taking: Reported on 07/01/2024 11/20/23   Katrinka Garnette KIDD, MD  busPIRone  (BUSPAR ) 5 MG tablet TAKE ONE (1) TABLET BY MOUTH TWICE DAILY AS NEEDED FOR ANXIETY 05/03/24   Katrinka Garnette KIDD, MD  cefpodoxime  (VANTIN ) 200 MG tablet Take 1 tablet (200 mg total) by mouth 2 (two) times daily for 7 days. 06/26/24 07/03/24  Bernis Ernst, PA-C  DULoxetine  (CYMBALTA ) 30 MG capsule Take 2 capsules (60 mg total) by mouth daily. 08/05/23   Katrinka Garnette KIDD, MD  flecainide  (TAMBOCOR ) 100 MG tablet TAKE ONE TABLET BY MOUTH TWICE DAILY 11/18/23   Katrinka Garnette KIDD, MD   metoprolol  succinate (TOPROL -XL) 25 MG 24 hr tablet TAKE 1/2 TABLET BY MOUTH TWICE DAILY 12/04/23   Katrinka Garnette KIDD, MD  Multiple Vitamins-Minerals (MULTI FOR HER 50+) TABS Take 1 tablet by mouth daily.    [provider]  Multiple Vitamins-Minerals (PRESERVISION AREDS 2+MULTI VIT PO) Take 1 tablet by mouth in the morning and at bedtime.    [provider]  omeprazole  (PRILOSEC) 40 MG capsule TAKE ONE (1) CAPSULE BY MOUTH TWICE DAILY 11/05/23   Katrinka Garnette KIDD, MD  Polyethyl Glycol-Propyl Glycol 0.4-0.3 % SOLN Apply 1-2 drops to eye daily as needed (stye, dryness).    [provider]  rivastigmine  (EXELON ) 3 MG capsule Take 1 capsule (3 mg total) by mouth 2 (two) times daily. 05/05/24 04/30/25  Gregg Lek, MD  rosuvastatin  (CRESTOR ) 5 MG tablet TAKE ONE TABLET BY MOUTH IN THE EVENING 10/03/23   Katrinka Garnette KIDD, MD    Allergies: Penicillins, Codeine, Codeine, Dabigatran  etexilate mesylate, Dilaudid  [hydromorphone ], Dabigatran  etexilate mesylate, Dabigatran  etexilate mesylate, Penicillins, Sulfa antibiotics, and Sulfonamide derivatives    Review of Systems  Constitutional:  Negative for activity change, appetite change and fever.  HENT:  Negative for congestion and rhinorrhea.   Respiratory:  Negative for cough, chest tightness and shortness of breath.   Cardiovascular:  Negative for chest pain.  Gastrointestinal:  Negative for abdominal pain,  nausea and vomiting.  Genitourinary:  Negative for dysuria.  Musculoskeletal:  Negative for arthralgias and myalgias.  Skin:  Positive for wound.  Neurological:  Negative for dizziness, weakness and headaches.   all other systems are negative except as noted in the HPI and PMH.    Updated Vital Signs BP (!) 147/62 (BP Location: Right Arm)   Pulse 65   Temp 97.9 F (36.6 C) (Oral)   Resp 16   Ht 5' 4 (1.626 m)   Wt 68 kg   SpO2 100%   BMI 25.75 kg/m   Physical Exam Vitals and nursing note reviewed.   Constitutional:      General: She is not in acute distress.    Appearance: She is well-developed.  HENT:     Head: Normocephalic and atraumatic.     Mouth/Throat:     Pharynx: No oropharyngeal exudate.  Eyes:     Conjunctiva/sclera: Conjunctivae normal.     Pupils: Pupils are equal, round, and reactive to light.  Neck:     Comments: No meningismus. Cardiovascular:     Rate and Rhythm: Normal rate and regular rhythm.     Heart sounds: Normal heart sounds. No murmur heard. Pulmonary:     Effort: Pulmonary effort is normal. No respiratory distress.     Breath sounds: Normal breath sounds.  Abdominal:     Palpations: Abdomen is soft.     Tenderness: There is no abdominal tenderness. There is no guarding or rebound.  Musculoskeletal:        General: Tenderness and signs of injury present.     Cervical back: Normal range of motion and neck supple.     Comments: Skin tear to right hand as depicted with surrounding erythema and scant purulence.  Able to move fingers with some pain.  No fluctuance.  Intact radial pulse. Healing abrasion right elbow  Skin:    General: Skin is warm.     Findings: Erythema present.  Neurological:     Mental Status: She is alert and oriented to person, place, and time.     Cranial Nerves: No cranial nerve deficit.     Motor: No abnormal muscle tone.     Coordination: Coordination normal.     Comments:  5/5 strength throughout. CN 2-12 intact.Equal grip strength.   Psychiatric:        Behavior: Behavior normal.        (all labs ordered are listed, but only abnormal results are displayed) Labs Reviewed  CBC WITH DIFFERENTIAL/PLATELET - Abnormal; Notable for the following components:      Result Value   Hemoglobin 11.2 (*)    HCT 34.9 (*)    All other components within normal limits  COMPREHENSIVE METABOLIC PANEL WITH GFR - Abnormal; Notable for the following components:   CO2 21 (*)    Glucose, Bld 104 (*)    Creatinine, Ser 1.04 (*)    GFR,  Estimated 51 (*)    All other components within normal limits  CULTURE, BLOOD (ROUTINE X 2)  CULTURE, BLOOD (ROUTINE X 2)  LACTIC ACID, PLASMA  LIPASE, BLOOD  URINALYSIS, ROUTINE W REFLEX MICROSCOPIC    EKG: None  Radiology: DG Hand Complete Right Result Date: 07/01/2024 CLINICAL DATA:  Wound, injury. EXAM: RIGHT HAND - COMPLETE 3+ VIEW COMPARISON:  None Available. FINDINGS: There is soft tissue swelling over the dorsal aspect of the distal metatarsals. There is no acute fracture or dislocation identified. There is diffuse interphalangeal joint space narrowing and  osteophyte formation compatible with degenerative change. This is severe in the second, third, fourth and fifth distal interphalangeal joints. There are moderate degenerative changes of the first carpometacarpal joint with joint space narrowing, sclerosis and subchondral cystic change. IMPRESSION: 1. No acute fracture or dislocation. 2. Soft tissue swelling over the dorsal aspect of the distal metatarsals. 3. Degenerative changes as described above. Electronically Signed   By: Greig Pique M.D.   On: 07/01/2024 23:41     Procedures   Medications Ordered in the ED - No data to display                                  Medical Decision Making Amount and/or Complexity of Data Reviewed Independent Historian: spouse Labs: ordered. Decision-making details documented in ED Course. Radiology: ordered and independent interpretation performed. Decision-making details documented in ED Course. ECG/medicine tests: ordered and independent interpretation performed. Decision-making details documented in ED Course.  Risk Prescription drug management. Decision regarding hospitalization.   Pain, drainage, redness to right dorsal and skin tear.  No fever.  No chest pain or shortness of breath.  Stable vitals.  X-ray in triage is negative for fracture or foreign body.  Results reviewed and interpreted by me  Labs are reassuring.  No  significant leukocytosis.  Lactate is normal.  IV Rocephin  and vancomycin  given.  Blood culture sent.  Concern for failure of outpatient antibiotics.  Will add MRSA coverage.  She has been on Vantin  for the past 3 days.  No fever or leukocytosis.  Does not appear to be toxic or septic.  Plan admission for IV antibiotics.  Discussed with Dr. Sundil.      Final diagnoses:  None    ED Discharge Orders     None          Kylor Valverde, Garnette, MD 07/02/24 [phone]

## 2024-07-02 NOTE — Progress Notes (Signed)
 Vanc ordered x7d for wound infection. Due to pt age and nephrotoxicity potential, we will use linezolid  instead after discussing with Dr. Melvin. She got a dose of vanc today so we will start linezolid  in AM.   Sergio Batch, PharmD, BCIDP, AAHIVP, CPP Infectious Disease Pharmacist 07/02/2024 1:35 PM

## 2024-07-02 NOTE — Telephone Encounter (Signed)
 Pt to be admitted. Waiting on transfer from [facility] ED

## 2024-07-02 NOTE — Progress Notes (Signed)
 Transition of Care [facility]) - Inpatient Brief Assessment   Patient Details  Name: Bridget Lane MRN: [id number] Date of Birth: [birth year]  Transition of Care [facility]) CM/SW Contact:    Rosaline JONELLE Joe, RN Phone Number: 07/02/2024, 4:13 PM   Clinical Narrative: Patient admitted to the hospital from home with cellulitis of right dorsal hand.  No IP Care management needs at this time.   Transition of Care Asessment: Insurance and Status: (P) Insurance coverage has been reviewed Patient has primary care physician: (P) Yes Home environment has been reviewed: (P) from home with spouse Prior level of function:: (P) self Prior/Current Home Services: (P) No current home services Social Drivers of Health Review: (P) SDOH reviewed no interventions necessary Readmission risk has been reviewed: (P) Yes Transition of care needs: (P) no transition of care needs at this time

## 2024-07-03 ENCOUNTER — Other Ambulatory Visit (HOSPITAL_COMMUNITY): Payer: Self-pay

## 2024-07-03 DIAGNOSIS — L03113 Cellulitis of right upper limb: Secondary | ICD-10-CM | POA: Diagnosis not present

## 2024-07-03 LAB — COMPREHENSIVE METABOLIC PANEL WITH GFR
ALT: 14 U/L (ref 0–44)
AST: 22 U/L (ref 15–41)
Albumin: 3.4 g/dL — ABNORMAL LOW (ref 3.5–5.0)
Alkaline Phosphatase: 57 U/L (ref 38–126)
Anion gap: 13 (ref 5–15)
BUN: 17 mg/dL (ref 8–23)
CO2: 21 mmol/L — ABNORMAL LOW (ref 22–32)
Calcium: 9.2 mg/dL (ref 8.9–10.3)
Chloride: 107 mmol/L (ref 98–111)
Creatinine, Ser: 1.02 mg/dL — ABNORMAL HIGH (ref 0.44–1.00)
GFR, Estimated: 53 mL/min — ABNORMAL LOW (ref >60)
Glucose, Bld: 96 mg/dL (ref 70–99)
Potassium: 3.7 mmol/L (ref 3.5–5.1)
Sodium: 141 mmol/L (ref 135–145)
Total Bilirubin: 0.7 mg/dL (ref 0.0–1.2)
Total Protein: 6.2 g/dL — ABNORMAL LOW (ref 6.5–8.1)

## 2024-07-03 LAB — CBC
HCT: 32.4 % — ABNORMAL LOW (ref 36.0–46.0)
Hemoglobin: 10.5 g/dL — ABNORMAL LOW (ref 12.0–15.0)
MCH: 28.9 pg (ref 26.0–34.0)
MCHC: 32.4 g/dL (ref 30.0–36.0)
MCV: 89.3 fL (ref 80.0–100.0)
Platelets: 304 K/uL (ref 150–400)
RBC: 3.63 MIL/uL — ABNORMAL LOW (ref 3.87–5.11)
RDW: 14.2 % (ref 11.5–15.5)
WBC: 6.4 K/uL (ref 4.0–10.5)
nRBC: 0 % (ref 0.0–0.2)

## 2024-07-03 MED ORDER — MUPIROCIN 2 % EX OINT
TOPICAL_OINTMENT | Freq: Two times a day (BID) | CUTANEOUS | 0 refills | Status: DC
  Filled 2024-07-03: qty 22, 20d supply, fill #0

## 2024-07-03 MED ORDER — DOXYCYCLINE HYCLATE 100 MG PO TABS
100.0000 mg | ORAL_TABLET | Freq: Two times a day (BID) | ORAL | 0 refills | Status: AC
Start: 2024-07-03 — End: 2024-07-07
  Filled 2024-07-03: qty 8, 4d supply, fill #0

## 2024-07-03 MED ORDER — MUPIROCIN 2 % EX OINT: TOPICAL_OINTMENT | Freq: Two times a day (BID) | CUTANEOUS | Status: DC

## 2024-07-03 NOTE — Consult Note (Signed)
 WOC Nurse Consult Note: Reason for Consult: skin tears R hand and R elbow  Wound type: full thickness r/t trauma  Pressure Injury POA: NA not pressure  Measurement: see nursing flowsheet  Wound bed: R elbow appears dry yellow; R hand 50% tan fibrinous 50% red  Drainage (amount, consistency, odor) see nursing flowsheet  Periwound: erythema noted around R hand wound  Dressing procedure/placement/frequency: Cleanse R elbow and R hand wounds with Vashe wound cleanser, do not rinse and allow to dry. Apply Mupirocin  ointment to wound beds 2 times daily and secure with Telfa nonstick dressing or silicone foams whichever available.    POC discussed with bedside nurse.  Would recommend patient follow-up with primary MD if wounds not healing after 7-10 days, R hand wound especially concerning.   WOC team will not follow. Re-consult if further needs arise.   Thank you,    Powell Bar MSN, RN-BC, [employer]

## 2024-07-03 NOTE — Progress Notes (Signed)
 Mobility Specialist Progress Note:    07/03/24 1007  Mobility  Activity Dangled on edge of bed  Level of Assistance Contact guard assist, steadying assist  Activity Response Tolerated well  Mobility Referral Yes  Mobility visit 1 Mobility  Mobility Specialist Start Time (ACUTE ONLY) [id number]  Mobility Specialist Stop Time (ACUTE ONLY) 0926  Mobility Specialist Time Calculation (min) (ACUTE ONLY) 10 min   Observed pt in room appearing to attempt to get up without assistance. Assisted pt to fully sit EOB to eat breakfast. Pt deferred further mobility, reason unspecified. Pt left EOB with alarm on. Personal belongings and call light within reach. All needs met.   Lavanda Pollack Mobility Specialist  Please contact via [employer] or  Rehab Office [phone]

## 2024-07-03 NOTE — Plan of Care (Signed)

## 2024-07-03 NOTE — Discharge Summary (Signed)
 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 *  Hospital Course: Bridget Lane is a 89 y.o. female with a history of hyperlipidemia, GERD, atrial fibrillation, dementia, depression, OSA, breast cancer, spinal stenosis.  Patient presented secondary to hand pain and found to have cellulitis of her right hand related to recent hand injury, not responding fully to outpatient antibiotics. Patient started on Vancomycin  and transitioned to Linezolid . Patient adamant about discharge, and unable to secure Linezolid  for discharge, so patient discharged on doxycycline  and instructed to continue cefpodoxime . Wound care nurse consulted for recommendations for wound care on discharge.  Assessment and Plan:  Cellulitis of right hand Patient was started on Cefpodoxime  monotherapy as an outpatient but thought infection was not improving quickly enough. Patient started empirically on Vancomycin  and transitioned to Linezolid . Cellulitis with improvement from admission. Patient adamant about discharge home. Unable to ensure Linezolid  for discharge, so patient discharged on doxycycline  and to continue cefpodoxime .  Right hand wound Wound care consulted for recommendations. Unable to close at this time secondary to duration of injury. Recommend  continued wound care and outpatient PCP follow-up.  Hyperlipidemia Continue Crestor   GERD Continue Prilosec  Paroxysmal atrial fibrillation Continue flecainide  and metoprolol .  Dementia Continue Exelon .  Depression Continue Cymbalta  and Buspar .  History of breast cancer Noted. S/p lumpectomy and hormone treatment.  OSA Noted. Not using CPAP.   Consultants: None Procedures performed: None  Disposition: Home Diet recommendation: Regular diet   DISCHARGE MEDICATION: Allergies as of 07/03/2024       Reactions   Penicillins Rash   Codeine Other (See Comments)   hallucinations hallucinations   Codeine Other (See Comments)   nightmares   Dabigatran  Etexilate Mesylate    Unk reaction   Dilaudid  [hydromorphone ]    Rash with this in December 2022 hospitalization. hallucinations   Dabigatran  Etexilate Mesylate Other (See Comments)    All extremities feel heavy and hurt   Dabigatran  Etexilate Mesylate Other (See Comments)    All extremities feel heavy and hurt   Penicillins Rash   Sulfa Antibiotics Rash   Sulfonamide Derivatives Rash        Medication List     PAUSE taking these medications    DULoxetine  30 MG capsule Wait to take this until: July 07, 2024 Commonly known as: CYMBALTA  Take 2 capsules (60 mg total) by mouth daily.       TAKE these medications    busPIRone  5 MG tablet Commonly known as: BUSPAR  TAKE ONE (1) TABLET BY MOUTH TWICE DAILY AS NEEDED FOR ANXIETY What changed: See the new instructions.   cefpodoxime  200 MG tablet Commonly known as: VANTIN  Take 1 tablet (200 mg total) by mouth 2 (two) times daily for 7 days.   doxycycline  100 MG tablet Commonly known as: VIBRA -TABS Take 1 tablet (100 mg total) by mouth 2 (  two) times daily for 4 days.   flecainide  100 MG tablet Commonly known as: TAMBOCOR  TAKE ONE TABLET BY MOUTH TWICE DAILY   metoprolol  succinate 25 MG 24 hr tablet Commonly known as: TOPROL -XL TAKE 1/2 TABLET BY  MOUTH TWICE DAILY   Multi For Her 50+ Tabs Take 1 tablet by mouth daily.   PRESERVISION AREDS 2+MULTI VIT PO Take 1 tablet by mouth in the morning and at bedtime.   omeprazole  40 MG capsule Commonly known as: PRILOSEC TAKE ONE (1) CAPSULE BY MOUTH TWICE DAILY What changed:  how much to take how to take this when to take this   Polyethyl Glycol-Propyl Glycol 0.4-0.3 % Soln Apply 1-2 drops to eye daily as needed (stye, dryness).   rivastigmine  3 MG capsule Commonly known as: EXELON  Take 1 capsule (3 mg total) by mouth 2 (two) times daily.   rosuvastatin  5 MG tablet Commonly known as: CRESTOR  TAKE ONE TABLET BY MOUTH IN THE EVENING        Follow-up Information     Katrinka Garnette KIDD, MD. Schedule an appointment as soon as possible for a visit in 1 week(s).   Specialty: Family Medicine Why: For hospital follow-up Contact information: [address] [place] [address] [phone]                Discharge Exam: BP 134/60 (BP Location: Left Arm)   Pulse (!) 51   Temp 98.2 F (36.8 C) (Oral)   Resp 16   Ht 5' 4 (1.626 m)   Wt 68 kg   SpO2 (!) 71%   BMI 25.75 kg/m   General exam: Appears calm and comfortable Respiratory system: Clear to auscultation. Respiratory effort normal. Cardiovascular system: S1 & S2 heard, RRR. Gastrointestinal system: Abdomen is nondistended, soft and nontender. Normal bowel sounds heard. Central nervous system: Alert and oriented. No focal neurological deficits. Musculoskeletal: No edema. No calf tenderness. Patient able to fully flex and extend right third finger Skin: Wound on dorsum of right hand with improved erythema and no surrounding tenderness. Right elbow skin tear without surrounding erythema.   Condition at discharge: stable  The results of significant diagnostics from this hospitalization (including imaging, microbiology, ancillary and laboratory) are listed below for reference.   Imaging Studies: DG Hand  Complete Right Result Date: 07/01/2024 CLINICAL DATA:  Wound, injury. EXAM: RIGHT HAND - COMPLETE 3+ VIEW COMPARISON:  None Available. FINDINGS: There is soft tissue swelling over the dorsal aspect of the distal metatarsals. There is no acute fracture or dislocation identified. There is diffuse interphalangeal joint space narrowing and osteophyte formation compatible with degenerative change. This is severe in the second, third, fourth and fifth distal interphalangeal joints. There are moderate degenerative changes of the first carpometacarpal joint with joint space narrowing, sclerosis and subchondral cystic change. IMPRESSION: 1. No acute fracture or dislocation. 2. Soft tissue swelling over the dorsal aspect of the distal metatarsals. 3. Degenerative changes as described above. Electronically Signed   By: Greig Pique M.D.   On: 07/01/2024 23:41   CT CHEST ABDOMEN PELVIS W CONTRAST Result Date: 06/26/2024 CLINICAL DATA:  Polytrauma, blunt large bruise seen to chest, fall yesterday EXAM: CT CHEST, ABDOMEN, AND PELVIS WITH CONTRAST TECHNIQUE: Multidetector CT imaging of the chest, abdomen and pelvis was performed following the standard protocol during bolus administration of intravenous contrast. RADIATION DOSE REDUCTION: This exam was performed according to the departmental dose-optimization program which includes automated exposure control, adjustment of the mA and/or kV according to patient  size and/or use of iterative reconstruction technique. CONTRAST:  80mL OMNIPAQUE  IOHEXOL  300 MG/ML  SOLN COMPARISON:  10/01/2021 FINDINGS: CT CHEST FINDINGS Cardiovascular: Heart is normal size. Aorta is normal caliber. Scattered coronary artery and aortic atherosclerosis. Mediastinum/Nodes: No mediastinal, hilar, or axillary adenopathy. Trachea and esophagus are unremarkable. Thyroid  unremarkable. Lungs/Pleura: Biapical scarring. Nodular areas in the lingula and right middle lobe likely related to adjacent scarring.  Nodular density in the left lower lobe measures 11 mm on image 109. Scarring in the lung bases. No effusions. Musculoskeletal: Chest wall soft tissues are unremarkable. No acute bony abnormality. CT ABDOMEN PELVIS FINDINGS Hepatobiliary: Prior cholecystectomy. Mildly prominent intrahepatic and extrahepatic biliary ducts likely related to post cholecystectomy state. No focal hepatic abnormality or evidence of hepatic injury. Pancreas: No focal abnormality or ductal dilatation. Spleen: No splenic injury or perisplenic hematoma. Adrenals/Urinary Tract: No adrenal hemorrhage or renal injury identified. Bladder is unremarkable. Stomach/Bowel: Stomach, large and small bowel grossly unremarkable. Few scattered sigmoid diverticula. Moderate stool burden throughout the colon. Vascular/Lymphatic: Aortic atherosclerosis. No evidence of aneurysm or adenopathy. Reproductive: Prior hysterectomy.  No adnexal masses. Other: No free fluid or free air. Musculoskeletal: No acute bony abnormality. Degenerative disc and facet disease in the lower lumbar spine with grade 1 listhesis of L4 on L5. IMPRESSION: No acute findings or significant traumatic injury in the chest, abdomen or pelvis. Nodular areas in the lingula and right middle lobe likely related to scarring. 11 mm nodule in the left lower lobe warrants follow-up. Consider one of the following in 3 months for both low-risk and high-risk individuals: (a) repeat chest CT, (b) follow-up PET-CT, or (c) tissue sampling. This recommendation follows the consensus statement: Guidelines for Management of Incidental Pulmonary Nodules Detected on CT Images: From the Fleischner Society 2017; Radiology 2017; 284:228-243. Coronary artery disease, aortic atherosclerosis. Sigmoid diverticulosis.  Moderate stool burden throughout the colon. Electronically Signed   By: Franky Crease M.D.   On: 06/26/2024 18:10   CT Head Wo Contrast Result Date: 06/26/2024 CLINICAL DATA:  Multiple recent falls.  EXAM: CT HEAD WITHOUT CONTRAST CT CERVICAL SPINE WITHOUT CONTRAST TECHNIQUE: Multidetector CT imaging of the head and cervical spine was performed following the standard protocol without intravenous contrast. Multiplanar CT image reconstructions of the cervical spine were also generated. RADIATION DOSE REDUCTION: This exam was performed according to the departmental dose-optimization program which includes automated exposure control, adjustment of the mA and/or kV according to patient size and/or use of iterative reconstruction technique. COMPARISON:  CT head and cervical spine 03/21/2024 FINDINGS: CT HEAD FINDINGS Brain: There is no evidence of an acute infarct, intracranial hemorrhage, mass, midline shift, or extra-axial fluid collection. There is mild cerebral atrophy. Cerebral white matter hypodensities are similar to the prior CT and are nonspecific but compatible with mild chronic small vessel ischemic disease. 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 Vascular: Calcified atherosclerosis at the skull base. No hyperdense vessel. Skull: No fracture or suspicious lesion. Sinuses/Orbits: Paranasal sinuses and mastoid air cells are clear. Bilateral cataract extraction. Other: None. CT CERVICAL SPINE FINDINGS Alignment: Unchanged grade 1 anterolisthesis of C3 on C4, C6 on C7, C7 on T1, T1 on T2, and T2 on T3. Skull base and vertebrae: No acute fracture or suspicious lesion. Similar appearance of asymmetrically severe left C1-2 arthropathy. Solid C4-C6 ACDF. Facet ankylosis bilaterally at C2-3 and C4-5 and on the right at C5-6. Soft tissues and spinal canal: No prevertebral fluid or swelling. No visible canal hematoma. Disc levels: Moderately advanced disc degeneration  at C6-7. Widespread facet arthrosis. No evidence of high-grade spinal canal stenosis. Severe right neural foraminal stenosis at C3-4 due to uncovertebral and facet spurring. Upper chest: Biapical pleuroparenchymal lung scarring. Other: 1  cm left thyroid  nodule for which no follow-up imaging is recommended. IMPRESSION: 1. No evidence of acute intracranial abnormality or cervical spine fracture. 2. Mild chronic small vessel ischemic disease. 3. Degenerative and postoperative changes in the cervical spine as above. Electronically Signed   By: Dasie Hamburg M.D.   On: 06/26/2024 14:12   CT Cervical Spine Wo Contrast Result Date: 06/26/2024 CLINICAL DATA:  Multiple recent falls. EXAM: CT HEAD WITHOUT CONTRAST CT CERVICAL SPINE WITHOUT CONTRAST TECHNIQUE: Multidetector CT imaging of the head and cervical spine was performed following the standard protocol without intravenous contrast. Multiplanar CT image reconstructions of the cervical spine were also generated. RADIATION DOSE REDUCTION: This exam was performed according to the departmental dose-optimization program which includes automated exposure control, adjustment of the mA and/or kV according to patient size and/or use of iterative reconstruction technique. COMPARISON:  CT head and cervical spine 03/21/2024 FINDINGS: CT HEAD FINDINGS Brain: There is no evidence of an acute infarct, intracranial hemorrhage, mass, midline shift, or extra-axial fluid collection. There is mild cerebral atrophy. Cerebral white matter hypodensities are similar to the prior CT and are nonspecific but compatible with mild chronic small vessel ischemic disease. 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 Vascular: Calcified atherosclerosis at the skull base. No hyperdense vessel. Skull: No fracture or suspicious lesion. Sinuses/Orbits: Paranasal sinuses and mastoid air cells are clear. Bilateral cataract extraction. Other: None. CT CERVICAL SPINE FINDINGS Alignment: Unchanged grade 1 anterolisthesis of C3 on C4, C6 on C7, C7 on T1, T1 on T2, and T2 on T3. Skull base and vertebrae: No acute fracture or suspicious lesion. Similar appearance of asymmetrically severe left C1-2 arthropathy. Solid C4-C6 ACDF. Facet  ankylosis bilaterally at C2-3 and C4-5 and on the right at C5-6. Soft tissues and spinal canal: No prevertebral fluid or swelling. No visible canal hematoma. Disc levels: Moderately advanced disc degeneration at C6-7. Widespread facet arthrosis. No evidence of high-grade spinal canal stenosis. Severe right neural foraminal stenosis at C3-4 due to uncovertebral and facet spurring. Upper chest: Biapical pleuroparenchymal lung scarring. Other: 1 cm left thyroid  nodule for which no follow-up imaging is recommended. IMPRESSION: 1. No evidence of acute intracranial abnormality or cervical spine fracture. 2. Mild chronic small vessel ischemic disease. 3. Degenerative and postoperative changes in the cervical spine as above. Electronically Signed   By: Dasie Hamburg M.D.   On: 06/26/2024 14:12   DG Elbow Complete Right Result Date: 06/26/2024 CLINICAL DATA:  Fall, skin tears. EXAM: RIGHT ELBOW - COMPLETE 3+ VIEW COMPARISON:  None Available. FINDINGS: No acute osseous or joint abnormality. IMPRESSION: No acute osseous or joint abnormality. Electronically Signed   By: Newell Eke M.D.   On: 06/26/2024 14:09   DG Hand Complete Right Result Date: 06/26/2024 CLINICAL DATA:  Fall, swelling. EXAM: RIGHT HAND - COMPLETE 3+ VIEW COMPARISON:  None Available. FINDINGS: Extensive degenerative changes in the proximal and distal interphalangeal joints. Suspect Gull wing deformities in the second, third and fourth distal interphalangeal joints. Osteopenia. Degenerative changes at the first carpometacarpal and scaphoid trapezium trapezoid joints. No definite fracture. IMPRESSION: 1. No definite acute fracture. 2. 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 Electronically Signed   By: Newell Eke M.D.   On: 06/26/2024 14:08  Microbiology: Results for orders placed or performed during the hospital encounter of 07/02/24  Blood culture  (routine x 2)     Status: None (Preliminary result)   Collection Time: 07/02/24  5:36 AM   Specimen: BLOOD LEFT HAND  Result Value Ref Range Status   Specimen Description   Final    BLOOD LEFT HAND Performed at [facility], [address], [place], [place] [address]    Special Requests   Final    BOTTLES DRAWN AEROBIC AND ANAEROBIC Blood Culture results may not be optimal due to an inadequate volume of blood received in culture bottles Performed at [facility], [address] [place], [place] [address]    Culture   Final    NO GROWTH < 24 HOURS Performed at [facility], [address] [place], [place] [address]    Report Status PENDING  Incomplete  Blood culture (routine x 2)     Status: None (Preliminary result)   Collection Time: 07/02/24  5:40 AM   Specimen: BLOOD  Result Value Ref Range Status   Specimen Description   Final    BLOOD RIGHT ANTECUBITAL Performed at [facility], [address], [place], [place] [address]    Special Requests   Final    BOTTLES DRAWN AEROBIC AND ANAEROBIC Blood Culture results may not be optimal due to an inadequate volume of blood received in culture bottles Performed at [facility], [address], [place], [place] [address]    Culture   Final    NO GROWTH < 24 HOURS Performed at [facility], [address], [place], [place] [address]    Report Status PENDING  Incomplete  MRSA Next Gen by PCR, Nasal     Status: None   Collection Time: 07/02/24  5:05 PM   Specimen: Nasal Mucosa; Nasal Swab  Result Value Ref Range Status   MRSA by PCR Next Gen NOT DETECTED NOT DETECTED Final    Comment: (NOTE) The GeneXpert MRSA Assay (FDA approved for NASAL specimens only), is one component of a comprehensive MRSA colonization surveillance program. It is not intended to diagnose MRSA infection nor to guide or monitor treatment for MRSA infections. Test  performance is not FDA approved in patients less than 48 years old. Performed at [facility], [address], [place], [place] [address]     Labs: CBC: Recent Labs  Lab 06/26/24 1550 07/02/24 0508 07/03/24 0530  WBC 8.2 7.8 6.4  NEUTROABS 5.0 4.5  --   HGB 11.0* 11.2* 10.5*  HCT 33.7* 34.9* 32.4*  MCV 88.9 88.8 89.3  PLT 298 307 304   Basic Metabolic Panel: Recent Labs  Lab 06/26/24 1550 07/02/24 0508 07/03/24 0530  NA 143 141 141  K 3.7 4.0 3.7  CL 109 106 107  CO2 18* 21* 21*  GLUCOSE 94 104* 96  BUN 24* 21 17  CREATININE 1.27* 1.04* 1.02*  CALCIUM  9.9 9.9 9.2   Liver Function Tests: Recent Labs  Lab 06/26/24 1550 07/02/24 0508 07/03/24 0530  AST 28 29 22   ALT 16 16 14   ALKPHOS 85 79 57  BILITOT 0.6 0.4 0.7  PROT 6.9 7.0 6.2*  ALBUMIN 4.2 4.1 3.4*    Discharge time spent: 35 minutes.  Signed: Elgin Lam, MD [facility] 07/03/2024

## 2024-07-03 NOTE — Discharge Instructions (Addendum)
 Bridget Lane,  You were in the hospital because of a skin infection. This has improved with antibiotics. Please continue your antibiotics as prescribed on discharge. Please follow-up with your PCP. If your infection worsens, you will need to return and likely need to stay for a full course of IV antibiotics.
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
## 2024-07-04 NOTE — Hospital Course (Signed)
 Bridget Lane is a 89 y.o. female with a history of hyperlipidemia, GERD, atrial fibrillation, dementia, depression, OSA, breast cancer, spinal stenosis.  Patient presented secondary to hand pain and found to have cellulitis of her right hand related to recent hand injury, not responding fully to outpatient antibiotics. Patient started on Vancomycin  and transitioned to Linezolid . Patient adamant about discharge, and unable to secure Linezolid  for discharge, so patient discharged on doxycycline  and instructed to continue cefpodoxime . Wound care nurse consulted for recommendations for wound care on discharge.

## 2024-07-05 ENCOUNTER — Telehealth: Payer: Self-pay

## 2024-07-05 NOTE — Transitions of Care (Post Inpatient/ED Visit) (Signed)
 07/05/2024  Name: Bridget Lane MRN: [id number] DOB: December 17, 1932  Today's TOC FU Call Status: Today's TOC FU Call Status:: Successful TOC FU Call Completed TOC FU Call Complete Date: 07/05/24 Patient's Name and Date of Birth confirmed.  Transition Care Management Follow-up Telephone Call Date of Discharge: 07/03/24 Discharge Facility: [facility]) Type of Discharge: Inpatient Admission Primary Inpatient Discharge Diagnosis:: cellutitis How have you been since you were released from the hospital?: Same Any questions or concerns?: No  Items Reviewed: Did you receive and understand the discharge instructions provided?: Yes Medications obtained,verified, and reconciled?: Yes (Medications Reviewed) Any new allergies since your discharge?: No Dietary orders reviewed?: Yes Do you have support at home?: Yes People in Home [RPT]: child(ren), adult, spouse  Medications Reviewed Today: Medications Reviewed Today     Reviewed by Emmitt Pan, LPN (Licensed Practical Nurse) on 07/05/24 at 1210  Med List Status: <None>   Medication Order Taking? Sig Documenting Provider Last Dose Status Informant  busPIRone  (BUSPAR ) 5 MG tablet [id number] Yes TAKE ONE (1) TABLET BY MOUTH TWICE DAILY AS NEEDED FOR ANXIETY  Patient taking differently: Take 5 mg by mouth 2 (two) times daily.   Katrinka Garnette KIDD, MD  Active Self, Pharmacy Records  doxycycline  (VIBRA -TABS) 100 MG tablet [id number] Yes Take 1 tablet (100 mg total) by mouth 2 (two) times daily for 4 days. Briana Elgin LABOR, MD  Active   DULoxetine  (CYMBALTA ) 30 MG capsule [id number]  Take 2 capsules (60 mg total) by mouth daily.  Patient not taking: Reported on 07/05/2024   Katrinka Garnette KIDD, MD  Active Self, Pharmacy Records  flecainide  (TAMBOCOR ) 100 MG tablet [id number] Yes TAKE ONE TABLET BY MOUTH TWICE DAILY Katrinka Garnette KIDD, MD  Active Self, Pharmacy Records  metoprolol  succinate (TOPROL -XL) 25 MG 24 hr tablet [id number] Yes TAKE 1/2  TABLET BY MOUTH TWICE DAILY Katrinka Garnette KIDD, MD  Active Self, Pharmacy Records  Multiple Vitamins-Minerals [facility] FOR HER 50+) TABS [id number] Yes Take 1 tablet by mouth daily. [provider]  Active Self, Pharmacy Records  Multiple Vitamins-Minerals (PRESERVISION AREDS 2+MULTI VIT PO) [id number] Yes Take 1 tablet by mouth in the morning and at bedtime. [provider]  Active Self, Pharmacy Records  mupirocin  ointment (BACTROBAN ) 2 % [id number] Yes Apply topically 2 (two) times daily. Briana Elgin LABOR, MD  Active   omeprazole  (PRILOSEC) 40 MG capsule [id number] Yes TAKE ONE (1) CAPSULE BY MOUTH TWICE DAILY  Patient taking differently: Take 40 mg by mouth daily. TAKE ONE (1) CAPSULE BY MOUTH TWICE DAILY   Katrinka Garnette KIDD, MD  Active Self, Pharmacy Records  Polyethyl Glycol-Propyl Glycol 0.4-0.3 % SOLN [id number] Yes Apply 1-2 drops to eye daily as needed (stye, dryness). [provider]  Active Self, Pharmacy Records  rivastigmine  (EXELON ) 3 MG capsule [id number] Yes Take 1 capsule (3 mg total) by mouth 2 (two) times daily. Gregg Lek, MD  Active Self, Pharmacy Records  rosuvastatin  (CRESTOR ) 5 MG tablet [id number] Yes TAKE ONE TABLET BY MOUTH IN THE KARNA Katrinka Garnette KIDD, MD  Active Self, Pharmacy Records            Home Care and Equipment/Supplies: Were Home Health Services Ordered?: NA Any new equipment or medical supplies ordered?: NA  Functional Questionnaire: Do you need assistance with bathing/showering or dressing?: No Do you need assistance with meal preparation?: No Do you need assistance with eating?: No Do you have difficulty maintaining continence: No Do you need assistance with getting out of bed/getting  out of a chair/moving?: No Do you have difficulty managing or taking your medications?: No  Follow up appointments reviewed: PCP Follow-up appointment confirmed?: Yes Date of PCP follow-up appointment?: 07/12/24 Follow-up Provider:  [facility] Follow-up appointment confirmed?: NA Do you need transportation to your follow-up appointment?: No Do you understand care options if your condition(s) worsen?: Yes-patient verbalized understanding    SIGNATURE Julian Lemmings, LPN [facility] Nurse Health Advisor Direct Dial [phone]

## 2024-07-07 LAB — CULTURE, BLOOD (ROUTINE X 2)
Culture: NO GROWTH
Culture: NO GROWTH

## 2024-07-09 ENCOUNTER — Telehealth: Payer: Self-pay

## 2024-07-09 DIAGNOSIS — F02A2 Dementia in other diseases classified elsewhere, mild, with psychotic disturbance: Secondary | ICD-10-CM

## 2024-07-11 ENCOUNTER — Encounter: Payer: Self-pay | Admitting: Family Medicine

## 2024-07-12 ENCOUNTER — Ambulatory Visit (INDEPENDENT_AMBULATORY_CARE_PROVIDER_SITE_OTHER): Admitting: Family Medicine

## 2024-07-12 ENCOUNTER — Encounter: Payer: Self-pay | Admitting: Family Medicine

## 2024-07-12 ENCOUNTER — Other Ambulatory Visit: Payer: Self-pay | Admitting: Neurology

## 2024-07-12 VITALS — BP 130/64 | HR 63 | Temp 97.9°F | Ht 64.0 in | Wt 145.0 lb

## 2024-07-12 DIAGNOSIS — L03113 Cellulitis of right upper limb: Secondary | ICD-10-CM

## 2024-07-12 DIAGNOSIS — G301 Alzheimer's disease with late onset: Secondary | ICD-10-CM

## 2024-07-12 DIAGNOSIS — I480 Paroxysmal atrial fibrillation: Secondary | ICD-10-CM

## 2024-07-12 DIAGNOSIS — K219 Gastro-esophageal reflux disease without esophagitis: Secondary | ICD-10-CM

## 2024-07-12 DIAGNOSIS — E785 Hyperlipidemia, unspecified: Secondary | ICD-10-CM

## 2024-07-12 DIAGNOSIS — F02A2 Dementia in other diseases classified elsewhere, mild, with psychotic disturbance: Secondary | ICD-10-CM

## 2024-07-12 MED ORDER — MEMANTINE HCL 28 X 5 MG & 21 X 10 MG PO TABS: ORAL_TABLET | ORAL | 0 refills | Status: DC

## 2024-07-12 NOTE — Assessment & Plan Note (Signed)
 Not anticoagulated due to fall risk.  Appropriately rate controlled with flecainide  100 mg twice daily metoprolol  25 mg extended release.  May even be in sinus rhythm-EKG 06/26/2024 and sinus

## 2024-07-12 NOTE — Patient Instructions (Addendum)
 You are making good progress- this is going to be slow to heal but as long as no worsening redness, tenderness, fevers, or pain- lets continue to monitor   Ideally keep area covered and most to allow for wound healing  Recommended follow up: Return in about 7 months (around 02/09/2025) for physical or sooner if needed.Schedule b4 you leave.

## 2024-07-12 NOTE — Assessment & Plan Note (Signed)
 Lipids well-controlled within the last year with LDL under 70-continue current medication with rosuvastatin  5 mg daily
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
## 2024-07-12 NOTE — Assessment & Plan Note (Signed)
 Reported compliance with Exelon  3 mg twice daily.  Family reports to me from MyChart message that they may need to look at placement.  Rexulti  was too costly in the past and does not appear he ever consistently took Seroquel 

## 2024-07-12 NOTE — Assessment & Plan Note (Signed)
 Reasonable control on omeprazole  40 mg daily

## 2024-07-13 ENCOUNTER — Other Ambulatory Visit (HOSPITAL_COMMUNITY): Payer: Self-pay

## 2024-07-16 ENCOUNTER — Telehealth: Payer: Self-pay | Admitting: Licensed Clinical Social Worker

## 2024-07-16 NOTE — Patient Outreach (Signed)
 LCSW introduced self and explained role in Complex Care Management. Informed pt's Caregiver of EMMI referral. Caregiver stated that pt was seen by PCP on 9/22 and is not in need of additional support services at this time. Agreed to contact PCP if needs arise.

## 2024-07-23 DIAGNOSIS — R399 Unspecified symptoms and signs involving the genitourinary system: Secondary | ICD-10-CM | POA: Diagnosis not present

## 2024-07-23 DIAGNOSIS — R350 Frequency of micturition: Secondary | ICD-10-CM | POA: Diagnosis not present

## 2024-08-04 ENCOUNTER — Ambulatory Visit: Admitting: Family Medicine

## 2024-08-05 ENCOUNTER — Other Ambulatory Visit: Payer: Self-pay | Admitting: Family Medicine

## 2024-08-07 ENCOUNTER — Other Ambulatory Visit: Payer: Self-pay | Admitting: Neurology

## 2024-08-10 ENCOUNTER — Telehealth: Payer: Self-pay | Admitting: *Deleted

## 2024-08-10 ENCOUNTER — Other Ambulatory Visit: Payer: Self-pay | Admitting: Interventional Radiology

## 2024-08-10 DIAGNOSIS — M25561 Pain in right knee: Secondary | ICD-10-CM

## 2024-08-10 NOTE — Telephone Encounter (Signed)
 Called to schedule Bridget Lane f/u from [facility] 01-02-2024. Patients daughter states she has dementia and doesn't want to be around people. She states she is doing fine and declines to schedule appt. I did tell her to call if anything changes.adin

## 2024-08-12 ENCOUNTER — Emergency Department (HOSPITAL_BASED_OUTPATIENT_CLINIC_OR_DEPARTMENT_OTHER)

## 2024-08-12 ENCOUNTER — Other Ambulatory Visit: Payer: Self-pay

## 2024-08-12 ENCOUNTER — Emergency Department (HOSPITAL_BASED_OUTPATIENT_CLINIC_OR_DEPARTMENT_OTHER)
Admission: EM | Admit: 2024-08-12 | Discharge: 2024-08-12 | Disposition: A | Discharge status: Home / Self Care | Attending: Emergency Medicine | Admitting: Emergency Medicine

## 2024-08-12 DIAGNOSIS — R410 Disorientation, unspecified: Secondary | ICD-10-CM | POA: Diagnosis not present

## 2024-08-12 DIAGNOSIS — M79641 Pain in right hand: Secondary | ICD-10-CM | POA: Diagnosis not present

## 2024-08-12 DIAGNOSIS — S0181XA Laceration without foreign body of other part of head, initial encounter: Secondary | ICD-10-CM | POA: Insufficient documentation

## 2024-08-12 DIAGNOSIS — F0390 Unspecified dementia without behavioral disturbance: Secondary | ICD-10-CM | POA: Insufficient documentation

## 2024-08-12 DIAGNOSIS — Y92019 Unspecified place in single-family (private) house as the place of occurrence of the external cause: Secondary | ICD-10-CM | POA: Diagnosis not present

## 2024-08-12 DIAGNOSIS — S199XXA Unspecified injury of neck, initial encounter: Secondary | ICD-10-CM | POA: Diagnosis not present

## 2024-08-12 DIAGNOSIS — S06310A Contusion and laceration of right cerebrum without loss of consciousness, initial encounter: Secondary | ICD-10-CM | POA: Diagnosis not present

## 2024-08-12 DIAGNOSIS — S60511A Abrasion of right hand, initial encounter: Secondary | ICD-10-CM | POA: Diagnosis not present

## 2024-08-12 DIAGNOSIS — W010XXA Fall on same level from slipping, tripping and stumbling without subsequent striking against object, initial encounter: Secondary | ICD-10-CM | POA: Insufficient documentation

## 2024-08-12 DIAGNOSIS — S0990XA Unspecified injury of head, initial encounter: Secondary | ICD-10-CM | POA: Diagnosis not present

## 2024-08-12 DIAGNOSIS — M4312 Spondylolisthesis, cervical region: Secondary | ICD-10-CM | POA: Diagnosis not present

## 2024-08-12 MED ORDER — DOXYCYCLINE HYCLATE 100 MG PO TABS
100.0000 mg | ORAL_TABLET | Freq: Once | ORAL | Status: AC
  Administered 2024-08-12: 100 mg via ORAL
  Filled 2024-08-12: qty 1

## 2024-08-12 MED ORDER — LIDOCAINE-EPINEPHRINE (PF) 2 %-1:200000 IJ SOLN
10.0000 mL | Freq: Once | INTRAMUSCULAR | Status: AC
Start: 2024-08-12 — End: 2024-08-12
  Administered 2024-08-12: 10 mL via INTRADERMAL
  Filled 2024-08-12: qty 20

## 2024-08-12 MED ORDER — DOXYCYCLINE HYCLATE 100 MG PO CAPS: 100.0000 mg | ORAL_CAPSULE | Freq: Two times a day (BID) | ORAL | 0 refills | Status: AC
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
## 2024-08-12 NOTE — Discharge Instructions (Signed)
 Keep your area of your laceration clean and dry for the next 48 hours.  Use antibiotic as prescribed.  You can let this area wash with gentle soap and water  but do not do any vigorous scrubbing.  If there is any wound dehiscence once the stitches fall out follow-up with your primary care doctor.  Turn if symptoms worsen as well.  Images today are unremarkable.

## 2024-08-12 NOTE — ED Provider Notes (Signed)
 [place] EMERGENCY DEPARTMENT AT [facility] Provider Note   CSN: [id number] Arrival date & time: 08/12/24  1545     Patient presents with: Fall and Head Injury   Bridget Lane is a 89 y.o. female.   Patient laceration to her forehead after she tripped and fell prior to coming here.  Did not lose consciousness.  Not on blood thinners.  History of A-fib dementia.  Ambulatory after the fall.  Has an abrasion to her right hand.  Tetanus shots up-to-date.  Nothing makes it worse or better.  She states mechanical fall.  She lost her footing and hit her forehead on the rail of a wooden post.  The history is provided by the patient.       Prior to Admission medications   Medication Sig Start Date End Date Taking? Authorizing Provider  doxycycline  (VIBRAMYCIN ) 100 MG capsule Take 1 capsule (100 mg total) by mouth 2 (two) times daily for 7 days. 08/12/24 08/19/24 Yes Loran Auguste, DO  busPIRone  (BUSPAR ) 5 MG tablet TAKE ONE (1) TABLET BY MOUTH TWICE DAILY AS NEEDED FOR ANXIETY Patient taking differently: Take 5 mg by mouth 2 (two) times daily. 05/03/24   Katrinka Garnette KIDD, MD  DULoxetine  (CYMBALTA ) 30 MG capsule TAKE 2 CAPSULES BY MOUTH DAILY 08/05/24   Katrinka Garnette KIDD, MD  flecainide  (TAMBOCOR ) 100 MG tablet TAKE ONE TABLET BY MOUTH TWICE DAILY 11/18/23   Katrinka Garnette KIDD, MD  memantine  (NAMENDA  TITRATION PACK) tablet pack 5 mg/day for =1 week; 5 mg twice daily for =1 week; 15 mg/day given in 5 mg and 10 mg separated doses for =1 week; then 10 mg twice daily 07/12/24   Camara, Amadou, MD  metoprolol  succinate (TOPROL -XL) 25 MG 24 hr tablet TAKE 1/2 TABLET BY MOUTH TWICE DAILY 12/04/23   Katrinka Garnette KIDD, MD  Multiple Vitamins-Minerals (MULTI FOR HER 50+) TABS Take 1 tablet by mouth daily.    [provider]  Multiple Vitamins-Minerals (PRESERVISION AREDS 2+MULTI VIT PO) Take 1 tablet by mouth in the morning and at bedtime.    [provider]  mupirocin   ointment (BACTROBAN ) 2 % Apply topically 2 (two) times daily. 07/03/24   Briana Elgin LABOR, MD  omeprazole  (PRILOSEC) 40 MG capsule TAKE ONE (1) CAPSULE BY MOUTH TWICE DAILY Patient taking differently: Take 40 mg by mouth daily. TAKE ONE (1) CAPSULE BY MOUTH TWICE DAILY 11/05/23   Katrinka Garnette KIDD, MD  Polyethyl Glycol-Propyl Glycol 0.4-0.3 % SOLN Apply 1-2 drops to eye daily as needed (stye, dryness).    [provider]  rivastigmine  (EXELON ) 3 MG capsule Take 1 capsule (3 mg total) by mouth 2 (two) times daily. 05/05/24 04/30/25  Camara, Amadou, MD  rosuvastatin  (CRESTOR ) 5 MG tablet TAKE ONE TABLET BY MOUTH IN THE EVENING 10/03/23   Katrinka Garnette KIDD, MD    Allergies: Penicillins, Codeine, Codeine, Dabigatran  etexilate mesylate, Dilaudid  [hydromorphone ], Dabigatran  etexilate mesylate, Dabigatran  etexilate mesylate, Penicillins, Sulfa antibiotics, and Sulfonamide derivatives    Review of Systems  Updated Vital Signs BP (!) 151/67   Pulse 64   Temp 97.8 F (36.6 C)   Resp 14   Ht 5' 4 (1.626 m)   Wt 65.8 kg   SpO2 100%   BMI 24.90 kg/m   Physical Exam Vitals and nursing note reviewed.  Constitutional:      General: She is not in acute distress.    Appearance: She is well-developed. She is not ill-appearing.  HENT:  Head:     Comments: 4 cm laceration to the center of the forehead hemostatic, bruising to bilateral forehead    Mouth/Throat:     Mouth: Mucous membranes are moist.  Eyes:     Extraocular Movements: Extraocular movements intact.     Conjunctiva/sclera: Conjunctivae normal.     Pupils: Pupils are equal, round, and reactive to light.  Cardiovascular:     Rate and Rhythm: Normal rate and regular rhythm.     Heart sounds: No murmur heard. Pulmonary:     Effort: Pulmonary effort is normal. No respiratory distress.     Breath sounds: Normal breath sounds.  Abdominal:     Palpations: Abdomen is soft.     Tenderness: There is no abdominal tenderness.   Musculoskeletal:        General: No swelling.     Cervical back: Neck supple.  Skin:    General: Skin is warm and dry.     Capillary Refill: Capillary refill takes less than 2 seconds.     Comments: Abrasion to right hand  Neurological:     General: No focal deficit present.     Mental Status: She is alert and oriented to person, place, and time.     Cranial Nerves: No cranial nerve deficit.     Sensory: No sensory deficit.     Motor: No weakness.     Coordination: Coordination normal.     Comments: 5+ out of 5 strength throughout, normal sensation, normal speech normal strength  Psychiatric:        Mood and Affect: Mood normal.     (all labs ordered are listed, but only abnormal results are displayed) Labs Reviewed - No data to display  EKG: None  Radiology: DG Hand Complete Right Result Date: 08/12/2024 EXAM: 3 OR MORE VIEW(S) XRAY OF THE RIGHT HAND 08/12/2024 07:05:00 PM COMPARISON: Comparison dated 07/01/2024. No significant interval change. CLINICAL HISTORY: Pain. Patient tripped and fell, hitting the ground. Disoriented after the fall. Multiple injuries to the head including a laceration to the forehead and bruises to both temples. Pupils are equal and reactive to light. Patient is alert and oriented, with baseline difficulty with time. No loss of consciousness. FINDINGS: BONES AND JOINTS: No acute fracture. No focal osseous lesion. No joint dislocation. 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 SOFT TISSUES: The soft tissues are unremarkable. IMPRESSION: 1. No acute osseous abnormality. 2. 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 Electronically signed by: Norman Gatlin MD 08/12/2024 07:15 PM EDT RP Workstation: [id number]   CT Cervical Spine Wo Contrast Result Date: 08/12/2024 EXAM: CT CERVICAL SPINE WITHOUT CONTRAST 08/12/2024 06:32:06 PM TECHNIQUE: CT of the cervical spine was performed without the administration of  intravenous contrast. Multiplanar reformatted images are provided for review. Automated exposure control, iterative reconstruction, and/or weight based adjustment of the mA/kV was utilized to reduce the radiation dose to as low as reasonably achievable. COMPARISON: Comparison with 06/26/2024. CLINICAL HISTORY: Neck trauma (Age >= 65y). Table formatting from the original note was not included.; Pt via pov from home with multiple injuries to head. She has laceration to the center of her forehead as well as bruises to both temples. Pt's pupils are equal and reactive to light. ; ; Pt tripped and fell, hitting the ground with no loc. She was disoriented after the fall, but was trying to get up. Pt a\T\o , has some difficulty with time, but this is baseline. nad  noted. FINDINGS: CERVICAL SPINE: No change from prior. BONES AND ALIGNMENT: No acute fracture or traumatic malalignment. Anterolisthesis of C3 and C7 is unchanged. DEGENERATIVE CHANGES: Multilevel advanced facet arthropathy including asymmetrically severe left C1-C2 arthropathy. Moderate disc space height loss at C6-C7. No severe spinal canal narrowing. SOFT TISSUES: No prevertebral soft tissue swelling. IMPRESSION: 1. No acute abnormality of the cervical spine. Electronically signed by: Norman Gatlin MD 08/12/2024 06:49 PM EDT RP Workstation: [id number]   CT Maxillofacial Wo Contrast Result Date: 08/12/2024 EXAM: CT OF THE FACE WITHOUT CONTRAST 08/12/2024 06:32:06 PM TECHNIQUE: CT of the face was performed without the administration of intravenous contrast. Multiplanar reformatted images are provided for review. Automated exposure control, iterative reconstruction, and/or weight based adjustment of the mA/kV was utilized to reduce the radiation dose to as low as reasonably achievable. COMPARISON: None available. CLINICAL HISTORY: Facial trauma, blunt. Pt via pov from home with multiple injuries to head. She has laceration to the center of her forehead as  well as bruises to both temples. Pt's pupils are equal and reactive to light. Pt tripped and fell, hitting the ground with no loc. She was disoriented after the fall, but was trying to get up. Pt a\T\o , has some difficulty with time, but this is baseline. nad noted. FINDINGS: FACIAL BONES: No acute facial fracture. No mandibular dislocation. No suspicious bone lesion. ORBITS: Globes are intact. No acute traumatic injury. No inflammatory change. SINUSES AND MASTOIDS: No acute abnormality. SOFT TISSUES: Bilateral temporal contusions. Left face contusion. IMPRESSION: 1. No acute facial fracture. 2. Bilateral temporal soft tissue contusions, including left facial contusion. Electronically signed by: Norman Gatlin MD 08/12/2024 06:46 PM EDT RP Workstation: [id number]   CT Head Wo Contrast Result Date: 08/12/2024 EXAM: CT HEAD WITHOUT CONTRAST 08/12/2024 06:32:06 PM TECHNIQUE: CT of the head was performed without the administration of intravenous contrast. Automated exposure control, iterative reconstruction, and/or weight based adjustment of the mA/kV was utilized to reduce the radiation dose to as low as reasonably achievable. COMPARISON: 06/26/2024 CLINICAL HISTORY: Facial trauma, blunt. Pt via pov from home with multiple injuries to head. She has laceration to the center of her forehead as well as bruises to both temples. Pt's pupils are equal and reactive to light. Pt tripped and fell, hitting the ground with no loc. She was disoriented after the fall, but was trying to get up. Pt a\T\o, has some difficulty with time, but this is baseline. nad noted. FINDINGS: BRAIN AND VENTRICLES: No acute hemorrhage. No evidence of acute infarct. Stable degree of atrophy and chronic small vessel ischemia. 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 No hydrocephalus. No extra-axial collection. No mass effect or midline shift. Atherosclerosis of skullbase vasculature without  hyperdense vessel or abnormal calcification. ORBITS: Bilateral cataract resection. SINUSES: Partial chronic opacification of lower left mastoid air cells. SOFT TISSUES AND SKULL: Laceration and soft tissue gas about the forehead. No skull fracture. IMPRESSION: 1. No acute intracranial abnormality. 2. Laceration and soft tissue gas about the forehead. Electronically signed by: Norman Gatlin MD 08/12/2024 06:43 PM EDT RP Workstation: [id number]     .Laceration Repair  Date/Time: 08/12/2024 6:26 PM  Performed by: Ruthe Cornet, DO Authorized by: Ruthe Cornet, DO   Consent:    Consent obtained:  Verbal   Consent given by:  Patient   Risks, benefits, and alternatives were discussed: yes     Risks discussed:  Infection, need for additional repair, nerve damage, poor wound healing, poor cosmetic result,  pain, retained foreign body, tendon damage and vascular damage   Alternatives discussed:  No treatment Universal protocol:    Procedure explained and questions answered to patient or proxy's satisfaction: yes     Relevant documents present and verified: yes     Imaging studies available: yes     Patient identity confirmed:  Verbally with patient Anesthesia:    Anesthesia method:  Local infiltration   Local anesthetic:  Lidocaine  1% WITH epi Laceration details:    Location:  Face   Face location:  Forehead   Length (cm):  4   Depth (mm):  2 Pre-procedure details:    Preparation:  Patient was prepped and draped in usual sterile fashion and imaging obtained to evaluate for foreign bodies Exploration:    Wound extent: areolar tissue not violated, fascia not violated, no foreign body, no signs of injury, no nerve damage, no tendon damage, no underlying fracture and no vascular damage     Contaminated: no   Treatment:    Area cleansed with:  Shur-Clens   Amount of cleaning:  Extensive   Irrigation method:  Pressure wash   Visualized foreign bodies/material removed: no   Skin repair:     Repair method:  Sutures   Suture size:  5-0   Suture material:  Plain gut   Suture technique:  Simple interrupted   Number of sutures:  5 Approximation:    Approximation:  Close Repair type:    Repair type:  Simple Post-procedure details:    Dressing:  Open (no dressing)   Procedure completion:  Tolerated    Medications Ordered in the ED  lidocaine -EPINEPHrine  (XYLOCAINE  W/EPI) 2 %-1:200000 (PF) injection 10 mL (10 mLs Intradermal Given 08/12/24 1843)  doxycycline  (VIBRA -TABS) tablet 100 mg (100 mg Oral Given 08/12/24 1842)                                    Medical Decision Making Amount and/or Complexity of Data Reviewed Radiology: ordered.  Risk Prescription drug management.   Bridget Lane is here after mechanical fall at home.  Bruising to her forehead.  Unremarkable vitals.  She has a history of memory issues A-fib but is not on any blood thinners.  Unremarkable vitals here.  She has got a laceration to her forehead that was repaired with stitches.  Tetanus shots up-to-date.  She got abrasion to her right hand.  Will get a CT scan of her head neck face and right hand.  She has no midline spinal pain.  She was ambulatory after the fall.  Very well-appearing.  Tolerated stitches well.  Will give some antibiotics to help event infection as she has had some wound infections in the past.  Patient does have bruising to the forehead around the laceration site.  Overall CT scan of the head face and neck are unremarkable.  X-ray of the hand is unremarkable.  Recommend continue supportive care at home with Tylenol  and ibuprofen  and rest.  Understand wound care instructions.  Discharge.  This chart was dictated using voice recognition software.  Despite best efforts to proofread,  errors can occur which can change the documentation meaning.      Final diagnoses:  Laceration of forehead, initial encounter    ED Discharge Orders          Ordered    doxycycline  (VIBRAMYCIN ) 100  MG capsule  2 times daily  08/12/24 1901               Ruthe Cornet, DO 08/12/24 1956

## 2024-08-12 NOTE — ED Notes (Signed)
 Patient transported to CT

## 2024-08-20 DIAGNOSIS — M2012 Hallux valgus (acquired), left foot: Secondary | ICD-10-CM | POA: Diagnosis not present

## 2024-08-20 DIAGNOSIS — L603 Nail dystrophy: Secondary | ICD-10-CM | POA: Diagnosis not present

## 2024-08-20 DIAGNOSIS — I739 Peripheral vascular disease, unspecified: Secondary | ICD-10-CM | POA: Diagnosis not present

## 2024-08-27 ENCOUNTER — Other Ambulatory Visit: Payer: Self-pay | Admitting: Family Medicine

## 2024-08-30 ENCOUNTER — Ambulatory Visit: Admitting: Family Medicine

## 2024-08-30 ENCOUNTER — Other Ambulatory Visit: Payer: Self-pay

## 2024-08-30 VITALS — BP 110/78 | Ht 64.0 in | Wt 145.8 lb

## 2024-08-30 DIAGNOSIS — G8929 Other chronic pain: Secondary | ICD-10-CM | POA: Diagnosis not present

## 2024-08-30 DIAGNOSIS — M25561 Pain in right knee: Secondary | ICD-10-CM

## 2024-08-30 NOTE — Patient Instructions (Signed)
 Thank you for coming in today.   We've placed a referral to [facility] for consult for knee replacement.   See you back as needed.

## 2024-08-30 NOTE — Progress Notes (Unsigned)
   I, Leotis Batter, CMA acting as a scribe for Artist Lloyd, MD.  Bridget Lane is a 89 y.o. female who presents to [facility] at [facility] today for follow up of right knee pain. Pt was last seen by Dr. Lloyd on 05/05/24 for right knee pain / OA. Of note, pt had several falls prior to last visit. Did not find GAE to be helpful, maybe even caused worsening sx. XR of the R knee was obtained. Pt received intra-articular steroid injection for the right knee. Pt was set up for fitting for lateral offloading knee brace.   Today, patient reports that her knee pain radiates down the R leg. Falls due to knee pain. Patient's daughter wants to discuss knee surgery. Patient voices that she would like to speak with a careers adviser.   Diagnostic Imaging:   05/04/24 R knee XR 12/09/23 R knee XR 03/13/22 R knee XR 11/05/21 R & L knee XR 07/19/20 R & L knee XR   Pertinent review of systems: no fever or chills  Relevant historical information: Alzheimer's mild   Exam:  BP 110/78   Ht 5' 4 (1.626 m)   Wt 145 lb 12.8 oz (66.1 kg)   BMI 25.03 kg/m  General: Well Developed, well nourished, and in no acute distress.   MSK: right knee genu valgus decreased range of motion    Lab and Radiology Results No results found for this or any previous visit (from the past 72 hours). No results found.     Assessment and Plan: 89 y.o. female with right knee pain due to DJD.  Unfortunately I have exhausted pretty much every conservative management option including conventional steroid injections gel injections offloader knee braces and even genicular artery embolization.  The patient and her daughter are very interested in knee replacement.  I think that would work for her knee pain but I am worried about her ability to recover after the knee replacement.  She does have mild Alzheimer's.  Will go ahead and refer to orthopedic surgery to talk about surgical options.   PDMP not reviewed this  encounter. Orders Placed This Encounter  Procedures   Ambulatory referral to Orthopedic Surgery    Referral Priority:   Routine    Referral Type:   Surgical    Referral Reason:   Specialty Services Required    Requested Specialty:   Orthopedic Surgery    Number of Visits Requested:   1   No orders of the defined types were placed in this encounter.    Discussed warning signs or symptoms. Please see discharge instructions. Patient expresses understanding.   The above documentation has been reviewed and is accurate and complete Artist Lloyd, M.D.

## 2024-09-02 DIAGNOSIS — L603 Nail dystrophy: Secondary | ICD-10-CM | POA: Diagnosis not present

## 2024-09-07 ENCOUNTER — Ambulatory Visit: Admitting: Orthopaedic Surgery

## 2024-09-07 DIAGNOSIS — M1711 Unilateral primary osteoarthritis, right knee: Secondary | ICD-10-CM | POA: Diagnosis not present
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
## 2024-09-28 ENCOUNTER — Other Ambulatory Visit: Payer: Self-pay | Admitting: Family Medicine

## 2024-10-18 ENCOUNTER — Encounter: Payer: Self-pay | Admitting: Family Medicine

## 2024-10-18 DIAGNOSIS — G301 Alzheimer's disease with late onset: Secondary | ICD-10-CM

## 2024-10-18 DIAGNOSIS — F22 Delusional disorders: Secondary | ICD-10-CM

## 2024-10-25 ENCOUNTER — Telehealth: Payer: Self-pay | Admitting: *Deleted

## 2024-10-25 NOTE — Progress Notes (Unsigned)
 Complex Care Management Note Care Guide Note  10/25/2024 Name: Bridget Lane MRN: [id number] DOB: [birth year]   Complex Care Management Outreach Attempts: An unsuccessful telephone outreach was attempted today to offer the patient information about available complex care management services.  Follow Up Plan:  Additional outreach attempts will be made to offer the patient complex care management information and services.   Encounter Outcome:  No Answer  Thedford Franks, CMA, AAMA [place]  [facility], [facility] Guide, Lead Direct Dial: [phone]  Fax: [phone]

## 2024-10-26 NOTE — Progress Notes (Unsigned)
 Complex Care Management Note Care Guide Note  10/26/2024 Name: Bridget Lane MRN: [id number] DOB: 17-May-1933   Complex Care Management Outreach Attempts: A second unsuccessful outreach was attempted today to offer the patient with information about available complex care management services.  Follow Up Plan:  Additional outreach attempts will be made to offer the patient complex care management information and services.   Encounter Outcome:  No Answer  Thedford Franks, CMA, AAMA   [facility], [facility] Guide, Lead Direct Dial: [phone]  Fax: [phone]

## 2024-10-27 NOTE — Progress Notes (Signed)
 Complex Care Management Note Care Guide Note  10/27/2024 Name: DESMOND TUFANO MRN: [id number] DOB: Jun 12, 1933   Complex Care Management Outreach Attempts: A third unsuccessful outreach was attempted today to offer the patient with information about available complex care management services.  Follow Up Plan:  No further outreach attempts will be made at this time. We have been unable to contact the patient to offer or enroll patient in complex care management services.  Encounter Outcome:  No Answer  Thedford Franks, CMA, AAMA [place]  [facility], [facility] Guide, Lead Direct Dial: [phone]  Fax: [phone]

## 2024-10-29 ENCOUNTER — Other Ambulatory Visit: Payer: Self-pay | Admitting: Family Medicine

## 2024-11-04 NOTE — Telephone Encounter (Signed)
 Noted

## 2024-11-04 NOTE — Telephone Encounter (Signed)
 FYI

## 2024-11-05 ENCOUNTER — Ambulatory Visit (HOSPITAL_BASED_OUTPATIENT_CLINIC_OR_DEPARTMENT_OTHER)
Admission: RE | Admit: 2024-11-05 | Discharge: 2024-11-05 | Disposition: A | Discharge status: Home / Self Care | Source: Ambulatory Visit | Attending: Family Medicine | Admitting: Family Medicine

## 2024-11-05 ENCOUNTER — Encounter: Payer: Self-pay | Admitting: Family Medicine

## 2024-11-05 ENCOUNTER — Ambulatory Visit: Payer: Self-pay | Admitting: Family Medicine

## 2024-11-05 ENCOUNTER — Ambulatory Visit (INDEPENDENT_AMBULATORY_CARE_PROVIDER_SITE_OTHER): Admitting: Family Medicine

## 2024-11-05 VITALS — BP 118/62 | Temp 98.2°F | Resp 14 | Ht 64.0 in | Wt 144.0 lb

## 2024-11-05 DIAGNOSIS — I480 Paroxysmal atrial fibrillation: Secondary | ICD-10-CM

## 2024-11-05 DIAGNOSIS — R55 Syncope and collapse: Secondary | ICD-10-CM | POA: Insufficient documentation

## 2024-11-05 DIAGNOSIS — R7303 Prediabetes: Secondary | ICD-10-CM

## 2024-11-05 DIAGNOSIS — E785 Hyperlipidemia, unspecified: Secondary | ICD-10-CM | POA: Diagnosis not present

## 2024-11-05 DIAGNOSIS — G301 Alzheimer's disease with late onset: Secondary | ICD-10-CM | POA: Diagnosis not present

## 2024-11-05 DIAGNOSIS — F02A2 Dementia in other diseases classified elsewhere, mild, with psychotic disturbance: Secondary | ICD-10-CM | POA: Diagnosis not present

## 2024-11-05 DIAGNOSIS — Z131 Encounter for screening for diabetes mellitus: Secondary | ICD-10-CM | POA: Diagnosis not present

## 2024-11-05 DIAGNOSIS — M2041 Other hammer toe(s) (acquired), right foot: Secondary | ICD-10-CM | POA: Insufficient documentation

## 2024-11-05 DIAGNOSIS — M2042 Other hammer toe(s) (acquired), left foot: Secondary | ICD-10-CM | POA: Insufficient documentation

## 2024-11-05 DIAGNOSIS — Z79899 Other long term (current) drug therapy: Secondary | ICD-10-CM | POA: Diagnosis not present

## 2024-11-05 LAB — COMPREHENSIVE METABOLIC PANEL WITH GFR
ALT: 13 U/L (ref 3–35)
AST: 17 U/L (ref 5–37)
Albumin: 4 g/dL (ref 3.5–5.2)
Alkaline Phosphatase: 64 U/L (ref 39–117)
BUN: 28 mg/dL — ABNORMAL HIGH (ref 6–23)
CO2: 26 meq/L (ref 19–32)
Calcium: 9.3 mg/dL (ref 8.4–10.5)
Chloride: 106 meq/L (ref 96–112)
Creatinine, Ser: 1.11 mg/dL (ref 0.40–1.20)
GFR: 44.07 mL/min — ABNORMAL LOW
Glucose, Bld: 95 mg/dL (ref 70–99)
Potassium: 4.9 meq/L (ref 3.5–5.1)
Sodium: 139 meq/L (ref 135–145)
Total Bilirubin: 0.4 mg/dL (ref 0.2–1.2)
Total Protein: 6.4 g/dL (ref 6.0–8.3)

## 2024-11-05 LAB — CBC WITH DIFFERENTIAL/PLATELET
Basophils Absolute: 0.1 K/uL (ref 0.0–0.1)
Basophils Relative: 0.9 % (ref 0.0–3.0)
Eosinophils Absolute: 0.2 K/uL (ref 0.0–0.7)
Eosinophils Relative: 3 % (ref 0.0–5.0)
HCT: 30.1 % — ABNORMAL LOW (ref 36.0–46.0)
Hemoglobin: 10.2 g/dL — ABNORMAL LOW (ref 12.0–15.0)
Lymphocytes Relative: 21.4 % (ref 12.0–46.0)
Lymphs Abs: 1.5 K/uL (ref 0.7–4.0)
MCHC: 33.7 g/dL (ref 30.0–36.0)
MCV: 83.5 fl (ref 78.0–100.0)
Monocytes Absolute: 0.5 K/uL (ref 0.1–1.0)
Monocytes Relative: 6.9 % (ref 3.0–12.0)
Neutro Abs: 4.9 K/uL (ref 1.4–7.7)
Neutrophils Relative %: 67.8 % (ref 43.0–77.0)
Platelets: 304 K/uL (ref 150.0–400.0)
RBC: 3.61 Mil/uL — ABNORMAL LOW (ref 3.87–5.11)
RDW: 15.6 % — ABNORMAL HIGH (ref 11.5–15.5)
WBC: 7.2 K/uL (ref 4.0–10.5)

## 2024-11-05 LAB — LIPID PANEL
Cholesterol: 140 mg/dL (ref 28–200)
HDL: 63.7 mg/dL
LDL Cholesterol: 55 mg/dL (ref 10–99)
NonHDL: 76.24
Total CHOL/HDL Ratio: 2
Triglycerides: 104 mg/dL (ref 10.0–149.0)
VLDL: 20.8 mg/dL (ref 0.0–40.0)

## 2024-11-05 LAB — VITAMIN B12: Vitamin B-12: 256 pg/mL (ref 211–911)

## 2024-11-05 LAB — HEMOGLOBIN A1C: Hgb A1c MFr Bld: 6 % (ref 4.6–6.5)

## 2024-11-05 LAB — TSH: TSH: 1.53 u[IU]/mL (ref 0.35–5.50)
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
## 2024-11-05 NOTE — Patient Instructions (Addendum)
 EKG today- largely reassuring Vision testing -was not able to do vision testing today- ? Whether related to dementia or if vision issues- asked her to see her eye doctor URGENTLY  Stat head CT and maxillofacial CT- family can likely support getting these completed  Please stop by lab before you go If you have mychart- we will send your results within 3 business days of us  receiving them.  If you do not have mychart- we will call you about results within 5 business days of us  receiving them.  *please also note that you will see labs on mychart as soon as they post. I will later go in and write notes on them- will say notes from Dr. Katrinka   We have placed a referral for you today to cardiology again (with a fib history and now syncope)- please call their # if you do not hear within a week (may be listed below or you may see mychart message within a few days with #).   Recommended follow up: Return for next already scheduled visit or sooner if needed. Seek care in emergency department if recurrent issues

## 2024-11-08 ENCOUNTER — Telehealth: Payer: Self-pay | Admitting: Family Medicine

## 2024-11-08 NOTE — Telephone Encounter (Signed)
 Sche pt request call back to fu with results or just some answering to below in my chart thanks aw

## 2024-11-08 NOTE — Telephone Encounter (Signed)
 Please call patient to schedule B12 injections.   Thanks!!!

## 2024-11-08 NOTE — Telephone Encounter (Signed)
 MyChart questions have already been sent to Dr. Katrinka for review from patients daughter. This is being handled on a separate encounter.   Copied from CRM [id number]. Topic: Clinical - Lab/Test Results >> Nov 08, 2024  3:31 PM Vena HERO wrote: Reason for CRM: Pt daughter Bridget Lane is requesting a call back [phone], to discuss lab results and next steps. Mother (patient) has dementia and may not understand what's going on so daughter would like a call first to discuss and then have a call placed to mother to also discuss at [phone]

## 2024-11-09 ENCOUNTER — Telehealth: Payer: Self-pay | Admitting: Family Medicine

## 2024-11-09 NOTE — Telephone Encounter (Signed)
 Spoke with patients daughter and answered all of the questions and verbalized understanding. No further questions at this time.   Copied from CRM [id number]. Topic: Clinical - Medical Advice >> Nov 09, 2024  2:19 PM Fonda T wrote: Reason for CRM: Pt daughter, Harris, calling requesting to speak with clinical staff in regards to appt pt had.  Reports she was not able to attend appt with pt, her father did, but would like to speak to nurse regarding those, prior to starting B12 injections.   Questions: 1. Pt is anemic, but no mediations were prescribed, wanted to know if she should start taking iron or take multivitamins with Iron?  2. Are B12 injections able to be given at home, or should she be coming to office to received injections. If she can get them at home, pt daughter would like to know what that would look like, and what would be the cost of those injections?  3. B12 supplement, when should she start, should she start taking supplement while taking injections?  4. When pt should return for follow up appt to recheck B12 levels?  Pt daughter is requesting a return call to discuss above questions.  Can be reached back at [phone].   Requesting

## 2024-11-10 ENCOUNTER — Ambulatory Visit

## 2024-11-10 DIAGNOSIS — E538 Deficiency of other specified B group vitamins: Secondary | ICD-10-CM | POA: Diagnosis not present

## 2024-11-10 MED ORDER — CYANOCOBALAMIN 1000 MCG/ML IJ SOLN
1000.0000 ug | Freq: Once | INTRAMUSCULAR | Status: AC
  Administered 2024-11-10: 1000 ug via INTRAMUSCULAR

## 2024-11-10 NOTE — Progress Notes (Signed)
 Patient is in office today for a nurse visit for B12 Injection, per PCP's order. Patient Injection was given in the  Right deltoid. Patient tolerated injection well. Patient requested printout of upcoming appts and provided for patient.

## 2024-11-15 ENCOUNTER — Other Ambulatory Visit (HOSPITAL_COMMUNITY): Payer: Self-pay

## 2024-11-17 ENCOUNTER — Ambulatory Visit: Admitting: Internal Medicine

## 2024-11-17 ENCOUNTER — Ambulatory Visit

## 2024-11-18 ENCOUNTER — Ambulatory Visit

## 2024-11-18 DIAGNOSIS — E538 Deficiency of other specified B group vitamins: Secondary | ICD-10-CM | POA: Diagnosis not present

## 2024-11-18 MED ORDER — CYANOCOBALAMIN 1000 MCG/ML IJ SOLN
1000.0000 ug | Freq: Once | INTRAMUSCULAR | Status: AC
  Administered 2024-11-18: 1000 ug via INTRAMUSCULAR

## 2024-11-18 NOTE — Progress Notes (Signed)
 Patient is in office today for a nurse visit for B12 Injection. Patient Injection was given in the  Right deltoid. Patient tolerated injection well. No other questions or concerns were addressed during the visit.

## 2024-11-24 ENCOUNTER — Other Ambulatory Visit: Payer: Self-pay | Admitting: Family Medicine

## 2024-11-24 ENCOUNTER — Ambulatory Visit

## 2024-11-24 ENCOUNTER — Other Ambulatory Visit: Payer: Self-pay

## 2024-11-24 ENCOUNTER — Telehealth: Payer: Self-pay | Admitting: Family Medicine

## 2024-11-24 DIAGNOSIS — E538 Deficiency of other specified B group vitamins: Secondary | ICD-10-CM

## 2024-11-24 DIAGNOSIS — Z111 Encounter for screening for respiratory tuberculosis: Secondary | ICD-10-CM

## 2024-11-24 MED ORDER — CYANOCOBALAMIN 1000 MCG/ML IJ SOLN
1000.0000 ug | Freq: Once | INTRAMUSCULAR | Status: AC
  Administered 2024-11-24: 1000 ug via INTRAMUSCULAR

## 2024-11-24 NOTE — Telephone Encounter (Signed)
 Type of form received: [facility] Form)  Additional comments: Pt's Daughter said this form may have been filled out digitally, but has brought the physical form to Dr Katrinka just in case  Received by: Mabel Margarita  Form should be Faxed to: N/A  Form should be mailed to: N/A  Is patient requesting call for pickup: Yes, Daughter [phone])   Form placed:  In Provider box  Attach charge sheet. Yes  Individual made aware of 3-5 business day turn around (Y/N)? Yes

## 2024-11-24 NOTE — Progress Notes (Signed)
 Patient is in office today for a nurse visit for B12 Injection. Patient Injection was given in the  Left deltoid. Patient tolerated injection well.

## 2024-11-24 NOTE — Telephone Encounter (Signed)
 Will go over with pt at next appt. Tb test labs ordered.

## 2024-11-25 ENCOUNTER — Encounter: Payer: Self-pay | Admitting: Family Medicine

## 2024-11-25 ENCOUNTER — Ambulatory Visit: Admitting: Family Medicine

## 2024-11-25 VITALS — BP 122/78 | HR 68 | Temp 97.7°F | Ht 64.0 in | Wt 139.6 lb

## 2024-11-25 DIAGNOSIS — E785 Hyperlipidemia, unspecified: Secondary | ICD-10-CM

## 2024-11-25 DIAGNOSIS — K219 Gastro-esophageal reflux disease without esophagitis: Secondary | ICD-10-CM

## 2024-11-25 DIAGNOSIS — Z111 Encounter for screening for respiratory tuberculosis: Secondary | ICD-10-CM

## 2024-11-25 DIAGNOSIS — I480 Paroxysmal atrial fibrillation: Secondary | ICD-10-CM

## 2024-11-25 DIAGNOSIS — G301 Alzheimer's disease with late onset: Secondary | ICD-10-CM

## 2024-11-25 MED ORDER — OMEPRAZOLE 40 MG PO CPDR: DELAYED_RELEASE_CAPSULE | ORAL | 11 refills | Status: AC

## 2024-11-25 NOTE — Patient Instructions (Addendum)
 This sounds like an exciting transition- I with you the best with the move  Team will fax the tuberculosis information once done  Recommended follow up: Return in about 6 months (around 05/25/2025) for physical or sooner if needed.Schedule b4 you leave.  - can push back the April visit
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
## 2024-12-01 ENCOUNTER — Ambulatory Visit

## 2024-12-06 ENCOUNTER — Ambulatory Visit: Admitting: Family Medicine

## 2024-12-09 ENCOUNTER — Ambulatory Visit: Admitting: Internal Medicine

## 2024-12-24 ENCOUNTER — Ambulatory Visit (HOSPITAL_COMMUNITY)

## 2025-02-11 ENCOUNTER — Encounter: Admitting: Family Medicine

## 2025-05-05 ENCOUNTER — Ambulatory Visit: Admitting: Neurology
